# Patient Record
Sex: Female | Born: 1945 | Hispanic: No | Smoking: Former smoker
Health system: Southern US, Community
[De-identification: ages and names within clinical notes are randomized; demographics above are authoritative.]

## PROBLEM LIST (undated history)

## (undated) DIAGNOSIS — Z515 Encounter for palliative care: Secondary | ICD-10-CM

## (undated) DIAGNOSIS — R0600 Dyspnea, unspecified: Secondary | ICD-10-CM

## (undated) DIAGNOSIS — J9622 Acute and chronic respiratory failure with hypercapnia: Secondary | ICD-10-CM

## (undated) DIAGNOSIS — M1990 Unspecified osteoarthritis, unspecified site: Secondary | ICD-10-CM

## (undated) DIAGNOSIS — Z923 Personal history of irradiation: Secondary | ICD-10-CM

## (undated) DIAGNOSIS — Z7189 Other specified counseling: Secondary | ICD-10-CM

## (undated) DIAGNOSIS — J45909 Unspecified asthma, uncomplicated: Secondary | ICD-10-CM

## (undated) DIAGNOSIS — I10 Essential (primary) hypertension: Secondary | ICD-10-CM

## (undated) DIAGNOSIS — E119 Type 2 diabetes mellitus without complications: Secondary | ICD-10-CM

## (undated) DIAGNOSIS — I519 Heart disease, unspecified: Secondary | ICD-10-CM

## (undated) HISTORY — PX: KNEE SURGERY: SHX244

## (undated) HISTORY — DX: Type 2 diabetes mellitus without complications: E11.9

## (undated) HISTORY — DX: Unspecified asthma, uncomplicated: J45.909

## (undated) HISTORY — DX: Encounter for palliative care: Z51.5

## (undated) HISTORY — DX: Essential (primary) hypertension: I10

## (undated) HISTORY — DX: Heart disease, unspecified: I51.9

## (undated) HISTORY — DX: Other specified counseling: Z71.89

## (undated) HISTORY — DX: Acute and chronic respiratory failure with hypercapnia: J96.22

---

## 2001-08-19 ENCOUNTER — Encounter: Payer: Self-pay | Admitting: Emergency Medicine

## 2001-08-19 ENCOUNTER — Emergency Department (HOSPITAL_COMMUNITY): Admission: EM | Admit: 2001-08-19 | Discharge: 2001-08-19 | Payer: Self-pay | Admitting: Emergency Medicine

## 2013-09-16 ENCOUNTER — Emergency Department (HOSPITAL_COMMUNITY): Payer: Medicare HMO

## 2013-09-16 ENCOUNTER — Encounter (HOSPITAL_COMMUNITY): Payer: Self-pay | Admitting: Emergency Medicine

## 2013-09-16 ENCOUNTER — Inpatient Hospital Stay (HOSPITAL_COMMUNITY)
Admission: EM | Admit: 2013-09-16 | Discharge: 2013-09-19 | DRG: 192 | Disposition: A | Discharge status: Home Health | Payer: Medicare HMO | Attending: Family Medicine | Admitting: Family Medicine

## 2013-09-16 DIAGNOSIS — F17200 Nicotine dependence, unspecified, uncomplicated: Secondary | ICD-10-CM | POA: Diagnosis present

## 2013-09-16 DIAGNOSIS — R4789 Other speech disturbances: Secondary | ICD-10-CM | POA: Diagnosis present

## 2013-09-16 DIAGNOSIS — R7303 Prediabetes: Secondary | ICD-10-CM | POA: Diagnosis present

## 2013-09-16 DIAGNOSIS — J449 Chronic obstructive pulmonary disease, unspecified: Secondary | ICD-10-CM | POA: Diagnosis present

## 2013-09-16 DIAGNOSIS — R202 Paresthesia of skin: Secondary | ICD-10-CM | POA: Diagnosis present

## 2013-09-16 DIAGNOSIS — Z8249 Family history of ischemic heart disease and other diseases of the circulatory system: Secondary | ICD-10-CM

## 2013-09-16 DIAGNOSIS — R209 Unspecified disturbances of skin sensation: Secondary | ICD-10-CM | POA: Diagnosis present

## 2013-09-16 DIAGNOSIS — J441 Chronic obstructive pulmonary disease with (acute) exacerbation: Principal | ICD-10-CM | POA: Diagnosis present

## 2013-09-16 DIAGNOSIS — R5381 Other malaise: Secondary | ICD-10-CM | POA: Diagnosis present

## 2013-09-16 DIAGNOSIS — R42 Dizziness and giddiness: Secondary | ICD-10-CM | POA: Diagnosis present

## 2013-09-16 DIAGNOSIS — R9431 Abnormal electrocardiogram [ECG] [EKG]: Secondary | ICD-10-CM

## 2013-09-16 DIAGNOSIS — R55 Syncope and collapse: Secondary | ICD-10-CM | POA: Diagnosis present

## 2013-09-16 DIAGNOSIS — R5383 Other fatigue: Secondary | ICD-10-CM

## 2013-09-16 DIAGNOSIS — IMO0002 Reserved for concepts with insufficient information to code with codable children: Secondary | ICD-10-CM

## 2013-09-16 DIAGNOSIS — R4781 Slurred speech: Secondary | ICD-10-CM

## 2013-09-16 DIAGNOSIS — R0682 Tachypnea, not elsewhere classified: Secondary | ICD-10-CM | POA: Diagnosis present

## 2013-09-16 DIAGNOSIS — R7309 Other abnormal glucose: Secondary | ICD-10-CM | POA: Diagnosis present

## 2013-09-16 DIAGNOSIS — I498 Other specified cardiac arrhythmias: Secondary | ICD-10-CM | POA: Diagnosis present

## 2013-09-16 DIAGNOSIS — I10 Essential (primary) hypertension: Secondary | ICD-10-CM | POA: Diagnosis present

## 2013-09-16 DIAGNOSIS — R000 Tachycardia, unspecified: Secondary | ICD-10-CM | POA: Diagnosis present

## 2013-09-16 MED ORDER — SODIUM CHLORIDE 0.9 % IV SOLN
INTRAVENOUS | Status: DC
  Administered 2013-09-17: via INTRAVENOUS

## 2013-09-16 NOTE — ED Notes (Signed)
Bed: WA06 Expected date:  Expected time:  Means of arrival:  Comments: Ems.68 year old female dizzy and weak

## 2013-09-16 NOTE — ED Notes (Signed)
Patient was getting ready for bed and began to feel dizzy and weak. Patient denies recent illness. Patient was seen by PCP today and was diagnosed with chronic bronchitis. Unremarkable 12 lead ekg by EMS. p-154/80 hr-100, cbg 130.

## 2013-09-17 ENCOUNTER — Encounter (HOSPITAL_COMMUNITY): Payer: Self-pay | Admitting: Neurology

## 2013-09-17 ENCOUNTER — Observation Stay (HOSPITAL_COMMUNITY): Payer: Medicare HMO

## 2013-09-17 DIAGNOSIS — R209 Unspecified disturbances of skin sensation: Secondary | ICD-10-CM

## 2013-09-17 DIAGNOSIS — I519 Heart disease, unspecified: Secondary | ICD-10-CM

## 2013-09-17 DIAGNOSIS — J449 Chronic obstructive pulmonary disease, unspecified: Secondary | ICD-10-CM

## 2013-09-17 DIAGNOSIS — R4789 Other speech disturbances: Secondary | ICD-10-CM

## 2013-09-17 DIAGNOSIS — F17200 Nicotine dependence, unspecified, uncomplicated: Secondary | ICD-10-CM

## 2013-09-17 DIAGNOSIS — R55 Syncope and collapse: Secondary | ICD-10-CM | POA: Diagnosis present

## 2013-09-17 DIAGNOSIS — J441 Chronic obstructive pulmonary disease with (acute) exacerbation: Principal | ICD-10-CM

## 2013-09-17 DIAGNOSIS — I10 Essential (primary) hypertension: Secondary | ICD-10-CM | POA: Diagnosis not present

## 2013-09-17 HISTORY — DX: Syncope and collapse: R55

## 2013-09-17 HISTORY — DX: Nicotine dependence, unspecified, uncomplicated: F17.200

## 2013-09-17 HISTORY — DX: Chronic obstructive pulmonary disease, unspecified: J44.9

## 2013-09-17 LAB — CBC WITH DIFFERENTIAL/PLATELET
BASOS PCT: 1 % (ref 0–1)
Basophils Absolute: 0.1 10*3/uL (ref 0.0–0.1)
EOS ABS: 0.1 10*3/uL (ref 0.0–0.7)
Eosinophils Relative: 1 % (ref 0–5)
HCT: 40.1 % (ref 36.0–46.0)
HEMOGLOBIN: 13.6 g/dL (ref 12.0–15.0)
Lymphocytes Relative: 20 % (ref 12–46)
Lymphs Abs: 2.6 10*3/uL (ref 0.7–4.0)
MCH: 29.5 pg (ref 26.0–34.0)
MCHC: 33.9 g/dL (ref 30.0–36.0)
MCV: 87 fL (ref 78.0–100.0)
Monocytes Absolute: 1 10*3/uL (ref 0.1–1.0)
Monocytes Relative: 7 % (ref 3–12)
NEUTROS ABS: 9.5 10*3/uL — AB (ref 1.7–7.7)
NEUTROS PCT: 72 % (ref 43–77)
PLATELETS: 352 10*3/uL (ref 150–400)
RBC: 4.61 MIL/uL (ref 3.87–5.11)
RDW: 13.5 % (ref 11.5–15.5)
WBC: 13.2 10*3/uL — ABNORMAL HIGH (ref 4.0–10.5)

## 2013-09-17 LAB — COMPREHENSIVE METABOLIC PANEL
ALBUMIN: 3.9 g/dL (ref 3.5–5.2)
ALT: 12 U/L (ref 0–35)
ALT: 11 U/L (ref 0–35)
AST: 17 U/L (ref 0–37)
AST: 18 U/L (ref 0–37)
Albumin: 3.5 g/dL (ref 3.5–5.2)
Alkaline Phosphatase: 68 U/L (ref 39–117)
Alkaline Phosphatase: 62 U/L (ref 39–117)
BUN: 10 mg/dL (ref 6–23)
BUN: 13 mg/dL (ref 6–23)
CO2: 26 mEq/L (ref 19–32)
CO2: 26 mEq/L (ref 19–32)
CREATININE: 0.67 mg/dL (ref 0.50–1.10)
Calcium: 9.5 mg/dL (ref 8.4–10.5)
Calcium: 8.5 mg/dL (ref 8.4–10.5)
Chloride: 98 mEq/L (ref 96–112)
Chloride: 101 mEq/L (ref 96–112)
Creatinine, Ser: 0.71 mg/dL (ref 0.50–1.10)
GFR calc Af Amer: >90 mL/min (ref >90)
GFR calc non Af Amer: 89 mL/min — ABNORMAL LOW (ref >90)
GFR calc non Af Amer: >90 mL/min (ref >90)
Glucose, Bld: 128 mg/dL — ABNORMAL HIGH (ref 70–99)
Glucose, Bld: 131 mg/dL — ABNORMAL HIGH (ref 70–99)
Potassium: 4.8 mEq/L (ref 3.7–5.3)
Potassium: 4.5 mEq/L (ref 3.7–5.3)
SODIUM: 138 meq/L (ref 137–147)
SODIUM: 139 meq/L (ref 137–147)
TOTAL PROTEIN: 6.7 g/dL (ref 6.0–8.3)
TOTAL PROTEIN: 7.5 g/dL (ref 6.0–8.3)
Total Bilirubin: <0.2 mg/dL — ABNORMAL LOW (ref 0.3–1.2)
Total Bilirubin: 0.2 mg/dL — ABNORMAL LOW (ref 0.3–1.2)

## 2013-09-17 LAB — CBC
HEMATOCRIT: 38.1 % (ref 36.0–46.0)
HEMOGLOBIN: 12.6 g/dL (ref 12.0–15.0)
MCH: 29 pg (ref 26.0–34.0)
MCHC: 33.1 g/dL (ref 30.0–36.0)
MCV: 87.6 fL (ref 78.0–100.0)
Platelets: 338 10*3/uL (ref 150–400)
RBC: 4.35 MIL/uL (ref 3.87–5.11)
RDW: 13.4 % (ref 11.5–15.5)
WBC: 12.7 10*3/uL — ABNORMAL HIGH (ref 4.0–10.5)

## 2013-09-17 LAB — URINALYSIS W MICROSCOPIC + REFLEX CULTURE
Bilirubin Urine: NEGATIVE
GLUCOSE, UA: NEGATIVE mg/dL
HGB URINE DIPSTICK: NEGATIVE
Ketones, ur: NEGATIVE mg/dL
LEUKOCYTES UA: NEGATIVE
Nitrite: NEGATIVE
PROTEIN: NEGATIVE mg/dL
SPECIFIC GRAVITY, URINE: 1.021 (ref 1.005–1.030)
UROBILINOGEN UA: 0.2 mg/dL (ref 0.0–1.0)
pH: 6.5 (ref 5.0–8.0)

## 2013-09-17 LAB — TROPONIN I
Troponin I: <0.3 ng/mL (ref <0.30)
Troponin I: <0.3 ng/mL (ref <0.30)

## 2013-09-17 LAB — PROTIME-INR
INR: 0.97 (ref 0.00–1.49)
Prothrombin Time: 12.7 seconds (ref 11.6–15.2)

## 2013-09-17 LAB — TSH: TSH: 0.745 u[IU]/mL (ref 0.350–4.500)

## 2013-09-17 LAB — INFLUENZA PANEL BY PCR (TYPE A & B)
H1N1 flu by pcr: NOT DETECTED
Influenza A By PCR: NEGATIVE
Influenza B By PCR: NEGATIVE

## 2013-09-17 LAB — HEMOGLOBIN A1C
HEMOGLOBIN A1C: 6.1 % — AB (ref <5.7)
Mean Plasma Glucose: 128 mg/dL — ABNORMAL HIGH (ref <117)

## 2013-09-17 LAB — LIPASE, BLOOD: LIPASE: 39 U/L (ref 11–59)

## 2013-09-17 LAB — LACTIC ACID, PLASMA: Lactic Acid, Venous: 1 mmol/L (ref 0.5–2.2)

## 2013-09-17 LAB — MRSA PCR SCREENING: MRSA by PCR: NEGATIVE

## 2013-09-17 MED ORDER — DEXTROSE 5 % IV SOLN
500.0000 mg | INTRAVENOUS | Status: DC
  Administered 2013-09-17 – 2013-09-19 (×3): 500 mg via INTRAVENOUS
  Filled 2013-09-17 (×2): qty 500

## 2013-09-17 MED ORDER — DEXTROSE 5 % IV SOLN
1.0000 g | INTRAVENOUS | Status: DC
  Administered 2013-09-17 – 2013-09-19 (×3): 1 g via INTRAVENOUS
  Filled 2013-09-17 (×3): qty 10

## 2013-09-17 MED ORDER — GUAIFENESIN ER 600 MG PO TB12
1200.0000 mg | ORAL_TABLET | Freq: Two times a day (BID) | ORAL | Status: DC
  Administered 2013-09-17 – 2013-09-19 (×5): 1200 mg via ORAL
  Filled 2013-09-17 (×7): qty 2

## 2013-09-17 MED ORDER — IPRATROPIUM BROMIDE 0.02 % IN SOLN
0.5000 mg | RESPIRATORY_TRACT | Status: DC
  Administered 2013-09-17: 0.5 mg via RESPIRATORY_TRACT
  Filled 2013-09-17: qty 2.5

## 2013-09-17 MED ORDER — ENOXAPARIN SODIUM 40 MG/0.4ML ~~LOC~~ SOLN
40.0000 mg | SUBCUTANEOUS | Status: DC
  Administered 2013-09-17 – 2013-09-18 (×2): 40 mg via SUBCUTANEOUS
  Filled 2013-09-17 (×3): qty 0.4

## 2013-09-17 MED ORDER — IPRATROPIUM-ALBUTEROL 0.5-2.5 (3) MG/3ML IN SOLN: 3.0000 mL | RESPIRATORY_TRACT | Status: DC | PRN

## 2013-09-17 MED ORDER — ACETAMINOPHEN 325 MG PO TABS
650.0000 mg | ORAL_TABLET | ORAL | Status: DC | PRN
Start: 2013-09-17 — End: 2013-09-19
  Administered 2013-09-17: 650 mg via ORAL
  Filled 2013-09-17: qty 2

## 2013-09-17 MED ORDER — ASPIRIN 81 MG PO CHEW
81.0000 mg | CHEWABLE_TABLET | Freq: Every day | ORAL | Status: DC
  Administered 2013-09-17 – 2013-09-19 (×3): 81 mg via ORAL
  Filled 2013-09-17 (×4): qty 1

## 2013-09-17 MED ORDER — LISINOPRIL 10 MG PO TABS
10.0000 mg | ORAL_TABLET | Freq: Every day | ORAL | Status: DC
  Administered 2013-09-17 – 2013-09-19 (×3): 10 mg via ORAL
  Filled 2013-09-17 (×3): qty 1

## 2013-09-17 MED ORDER — ATENOLOL 12.5 MG HALF TABLET
12.5000 mg | ORAL_TABLET | Freq: Every day | ORAL | Status: DC
  Administered 2013-09-17 – 2013-09-19 (×3): 12.5 mg via ORAL
  Filled 2013-09-17 (×3): qty 1

## 2013-09-17 MED ORDER — ACETAMINOPHEN 650 MG RE SUPP: 650.0000 mg | Freq: Four times a day (QID) | RECTAL | Status: DC | PRN

## 2013-09-17 MED ORDER — IPRATROPIUM-ALBUTEROL 0.5-2.5 (3) MG/3ML IN SOLN
RESPIRATORY_TRACT | Status: AC
  Filled 2013-09-17: qty 3

## 2013-09-17 MED ORDER — ACETAMINOPHEN 650 MG RE SUPP: 650.0000 mg | RECTAL | Status: DC | PRN

## 2013-09-17 MED ORDER — ALBUTEROL SULFATE (2.5 MG/3ML) 0.083% IN NEBU
2.5000 mg | INHALATION_SOLUTION | RESPIRATORY_TRACT | Status: DC
  Administered 2013-09-17: 2.5 mg via RESPIRATORY_TRACT
  Filled 2013-09-17: qty 3

## 2013-09-17 MED ORDER — HYDRALAZINE HCL 20 MG/ML IJ SOLN
10.0000 mg | INTRAMUSCULAR | Status: DC | PRN
  Administered 2013-09-17: 10 mg via INTRAVENOUS
  Filled 2013-09-17: qty 0.5

## 2013-09-17 MED ORDER — ACETAMINOPHEN 325 MG PO TABS
650.0000 mg | ORAL_TABLET | Freq: Four times a day (QID) | ORAL | Status: DC | PRN
  Administered 2013-09-17: 650 mg via ORAL
  Filled 2013-09-17: qty 2

## 2013-09-17 MED ORDER — PREDNISONE 50 MG PO TABS
50.0000 mg | ORAL_TABLET | Freq: Every day | ORAL | Status: DC
  Administered 2013-09-17 – 2013-09-19 (×3): 50 mg via ORAL
  Filled 2013-09-17 (×4): qty 1

## 2013-09-17 MED ORDER — SODIUM CHLORIDE 0.9 % IJ SOLN
3.0000 mL | Freq: Two times a day (BID) | INTRAMUSCULAR | Status: DC
  Administered 2013-09-17 – 2013-09-18 (×2): 3 mL via INTRAVENOUS

## 2013-09-17 MED ORDER — HYDROCHLOROTHIAZIDE 12.5 MG PO CAPS
12.5000 mg | ORAL_CAPSULE | Freq: Every day | ORAL | Status: DC
  Administered 2013-09-17 – 2013-09-19 (×3): 12.5 mg via ORAL
  Filled 2013-09-17 (×3): qty 1

## 2013-09-17 MED ORDER — IPRATROPIUM-ALBUTEROL 0.5-2.5 (3) MG/3ML IN SOLN
3.0000 mL | RESPIRATORY_TRACT | Status: DC
  Administered 2013-09-17: 3 mL via RESPIRATORY_TRACT

## 2013-09-17 NOTE — Progress Notes (Signed)
Pt was seen and examined.  H&P and orders reviewed.  Called neurology consultation.   Maryln Manuel. Pau Banh, MD

## 2013-09-17 NOTE — ED Provider Notes (Signed)
CSN: [id number]     Arrival date & time 09/16/13  2258 History   First MD Initiated Contact with Patient 09/16/13 2307     Chief Complaint  Patient presents with  . Weakness  . Near Syncope  . Nausea  . Emesis    HPI Pt was seen at 2325. Per pt and her family, c/o gradual onset and persistence of constant generalized weakness that began this evening approx 2100/2130 PTA. Pt states she was getting ready for bed when she felt lightheaded/near syncopal and generally weak. Pt's family heard her cry out and when they went into her room she was sitting in a chair laying her upper body on a table. Family states pt told them she "couldn't move" her entire body. Pt's family states pt's speech was slurred and she felt her entire LUE was "tingling/numb." Pt's family states when they sat her up she vomited x3. Family states pt would not walk for them so they called 911. Pt and family now feel she has returned to her baseline. Pt's only current complaint is generalized weakness/fatigue. Pt was seen by her PMD today for cough and sinus congestion, rx zithromax, mucinex, and delsym. Denies fevers, no sore throat, no rash, no neck pain, no falls, no abd pain, no diarrhea, no black or blood in stools or emesis, no CP/SOB, no back pain, no focal motor weakness, no further tingling/numbness in extremities, no incont of bowel/bladder, no syncope.     Past Medical History  Diagnosis Date  . Chronic bronchitis    No past surgical history on file.  History  Substance Use Topics  . Smoking status: Current Every Day Smoker    Types: Cigarettes  . Smokeless tobacco: Not on file  . Alcohol Use: No    Review of Systems ROS: Statement: All systems negative except as marked or noted in the HPI; Constitutional: Negative for fever and chills. ; ; Eyes: Negative for eye pain, redness and discharge. ; ; ENMT: Negative for ear pain, hoarseness, sore throat. +nasal congestion, sinus pressure and rhinorrhea. ; ;  Cardiovascular: Negative for chest pain, palpitations, diaphoresis, dyspnea and peripheral edema. ; ; Respiratory: +cough. Negative for wheezing and stridor. ; ; Gastrointestinal: +N/V. Negative for diarrhea, abdominal pain, blood in stool, hematemesis, jaundice and rectal bleeding. . ; ; Genitourinary: Negative for dysuria, flank pain and hematuria. ; ; Musculoskeletal: Negative for back pain and neck pain. Negative for swelling and trauma.; ; Skin: Negative for pruritus, rash, abrasions, blisters, bruising and skin lesion.; ; Neuro: Negative for headache and neck stiffness. Negative for altered level of consciousness , altered mental status, extremity weakness, involuntary movement, seizure and +generalized weakness, slurred speech, LUE "numbness," lightheadedness, near syncope.      Allergies  Codeine  Home Medications   Current Outpatient Rx  Name  Route  Sig  Dispense  Refill  . azithromycin (ZITHROMAX) 250 MG tablet   Oral   Take 250-500 mg by mouth daily.         Marland Kitchen. dextromethorphan (DELSYM) 30 MG/5ML liquid   Oral   Take by mouth as needed for cough.         Marland Kitchen. guaiFENesin (MUCINEX) 600 MG 12 hr tablet   Oral   Take 1,200 mg by mouth 2 (two) times daily.          BP 178/76  Pulse 92  Temp(Src) 98.3 F (36.8 C) (Oral)  Resp 18  SpO2 95% Physical Exam 2330: Physical examination:  Nursing notes reviewed;  Vital signs and O2 SAT reviewed;  Constitutional: Well developed, Well nourished, Well hydrated, In no acute distress; Head:  Normocephalic, atraumatic; Eyes: EOMI, PERRL, No scleral icterus; ENMT: TM's clear bilat. +edemetous nasal turbinates bilat with clear rhinorrhea. Mouth and pharynx without lesions. No tonsillar exudates. No intra-oral edema. No submandibular or sublingual edema. No hoarse voice, no drooling, no stridor. No pain with manipulation of larynx. Mouth and pharynx normal, Mucous membranes moist; Neck: Supple, Full range of motion, No lymphadenopathy;  Cardiovascular: Regular rate and rhythm, No gallop; Respiratory: Breath sounds coarse & equal bilaterally, No wheezes.  Speaking full sentences with ease, Normal respiratory effort/excursion; Chest: Nontender, Movement normal; Abdomen: Soft, Nontender, Nondistended, Normal bowel sounds; Genitourinary: No CVA tenderness; Extremities: Pulses normal, No tenderness, No edema, No calf edema or asymmetry.; Neuro: AA&Ox3, Major CN grossly intact.Speech clear.  No facial droop.  No nystagmus. Grips equal. Strength 5/5 equal bilat UE's and LE's.  DTR 2/4 equal bilat UE's and LE's.  No gross sensory deficits.  Normal cerebellar testing bilat UE's (finger-nose) and LE's (heel-shin)..; Skin: Color normal, Warm, Dry.   ED Course  Procedures   EKG Interpretation    Date/Time:  Monday September 16 2013 23:15:25 EST Ventricular Rate:  86 PR Interval:  148 QRS Duration: 81 QT Interval:  416 QTC Calculation: 498 R Axis:   77 Text Interpretation:  Sinus rhythm Anteroseptal infarct, age indeterminate Abnormal T, probable ischemia, widespread Lateral leads are also involved No old tracing to compare Confirmed by [facility]  MD, Nicholos Johns [phone]) on 09/16/2013 11:35:10 PM            MDM  MDM Reviewed: previous chart, nursing note and vitals Reviewed previous: labs Interpretation: labs, ECG, x-ray and CT scan   Results for orders placed during the hospital encounter of 09/16/13  CBC WITH DIFFERENTIAL      Result Value Range   WBC 13.2 (*) 4.0 - 10.5 K/uL   RBC 4.61  3.87 - 5.11 MIL/uL   Hemoglobin 13.6  12.0 - 15.0 g/dL   HCT 47.8  29.5 - 62.1 %   MCV 87.0  78.0 - 100.0 fL   MCH 29.5  26.0 - 34.0 pg   MCHC 33.9  30.0 - 36.0 g/dL   RDW 30.8  65.7 - 84.6 %   Platelets 352  150 - 400 K/uL   Neutrophils Relative % 72  43 - 77 %   Neutro Abs 9.5 (*) 1.7 - 7.7 K/uL   Lymphocytes Relative 20  12 - 46 %   Lymphs Abs 2.6  0.7 - 4.0 K/uL   Monocytes Relative 7  3 - 12 %   Monocytes Absolute 1.0  0.1 - 1.0  K/uL   Eosinophils Relative 1  0 - 5 %   Eosinophils Absolute 0.1  0.0 - 0.7 K/uL   Basophils Relative 1  0 - 1 %   Basophils Absolute 0.1  0.0 - 0.1 K/uL  COMPREHENSIVE METABOLIC PANEL      Result Value Range   Sodium 138  137 - 147 mEq/L   Potassium 4.8  3.7 - 5.3 mEq/L   Chloride 98  96 - 112 mEq/L   CO2 26  19 - 32 mEq/L   Glucose, Bld 128 (*) 70 - 99 mg/dL   BUN 13  6 - 23 mg/dL   Creatinine, Ser 9.62  0.50 - 1.10 mg/dL   Calcium 9.5  8.4 - 95.2 mg/dL   Total Protein 7.5  6.0 - 8.3 g/dL  Albumin 3.9  3.5 - 5.2 g/dL   AST 17  0 - 37 U/L   ALT 12  0 - 35 U/L   Alkaline Phosphatase 68  39 - 117 U/L   Total Bilirubin <0.2 (*) 0.3 - 1.2 mg/dL   GFR calc non Af Amer 89 (*) >90 mL/min   GFR calc Af Amer >90  >90 mL/min  LIPASE, BLOOD      Result Value Range   Lipase 39  11 - 59 U/L  LACTIC ACID, PLASMA      Result Value Range   Lactic Acid, Venous 1.0  0.5 - 2.2 mmol/L  TROPONIN I      Result Value Range   Troponin I <0.30  <0.30 ng/mL  URINALYSIS W MICROSCOPIC + REFLEX CULTURE      Result Value Range   Color, Urine YELLOW  YELLOW   APPearance CLEAR  CLEAR   Specific Gravity, Urine 1.021  1.005 - 1.030   pH 6.5  5.0 - 8.0   Glucose, UA NEGATIVE  NEGATIVE mg/dL   Hgb urine dipstick NEGATIVE  NEGATIVE   Bilirubin Urine NEGATIVE  NEGATIVE   Ketones, ur NEGATIVE  NEGATIVE mg/dL   Protein, ur NEGATIVE  NEGATIVE mg/dL   Urobilinogen, UA 0.2  0.0 - 1.0 mg/dL   Nitrite NEGATIVE  NEGATIVE   Leukocytes, UA NEGATIVE  NEGATIVE   WBC, UA 0-2  <3 WBC/hpf   RBC / HPF 0-2  <3 RBC/hpf   Bacteria, UA RARE  RARE   Squamous Epithelial / LPF RARE  RARE   Dg Chest 2 View 09/17/2013   CLINICAL DATA:  Week, nausea.  Recent diagnosis of bronchitis.  EXAM: CHEST  2 VIEW  COMPARISON:  Chest radiograph report August 19, 2001  FINDINGS: Cardiac silhouette is unremarkable, moderately calcified aortic knob, mediastinal silhouette is otherwise unremarkable. Mild chronic interstitial changes with  increased lung volumes, mild flattening of the hemidiaphragms. No pleural effusions or focal consolidations. No pneumothorax.  Patient is osteopenic. Soft tissue planes are unremarkable. Multiple EKG lines overlie the patient and may obscure subtle underlying pathology.  IMPRESSION: Mild COPD.   Electronically Signed   By: Awilda Metro   On: 09/17/2013 00:38   Ct Head Wo Contrast 09/17/2013   CLINICAL DATA:  Dizziness and weakness.  EXAM: CT HEAD WITHOUT CONTRAST  TECHNIQUE: Contiguous axial images were obtained from the base of the skull through the vertex without intravenous contrast.  COMPARISON:  None available for comparison at time of study interpretation.  FINDINGS: The ventricles and sulci are normal . No intraparenchymal hemorrhage, mass effect nor midline shift. Minimal patchy supratentorial white matter hypodensities are within normal range for patient's age and though non-specific suggest sequelae of chronic small vessel ischemic disease. No acute large vascular territory infarcts.  No abnormal extra-axial fluid collections. Basal cisterns are patent. Moderate calcific atherosclerosis of the carotid siphons.  No skull fracture. Subcentimeter nonaggressive ground-glass lesion in the left occipital calvarium involving the diploic space and outer table may reflect an epidermal inclusion cyst. Visualized paranasal sinuses and mastoid aircells are well-aerated. The included ocular globes and orbital contents are non-suspicious.  IMPRESSION: No acute intracranial process. Normal noncontrast CT of the head for age.   Electronically Signed   By: Awilda Metro   On: 09/17/2013 00:41      0230:  Pt not orthostatic. Neuro exam remains intact and unchanged, VSS, resps easy. Dx and testing d/w pt and family.  Questions answered.  Verb understanding,  agreeable to observation admit.  T/C to [facility]. Allena Katz, case discussed, including:  HPI, pertinent PM/SHx, VS/PE, dx testing, ED course and treatment:   Agreeable to observation admit, requests to write temporary orders, obtain tele bed to team 8.    Laray Anger, DO 09/17/13 1559

## 2013-09-17 NOTE — Progress Notes (Signed)
UR completed. Patient changed to inpatient r/t requiring IV antibiotics.  

## 2013-09-17 NOTE — H&P (Signed)
[facility] History and Physical  Patient: Katie Waller  WUJ:[id number]  DOB: 12-15-1945  DOS: the patient was seen and examined on 09/17/2013 PCP: Lolita PatellaEADE,ROBERT ALEXANDER, MD  Chief Complaint: Near syncope  HPI: Katie Waller is a 68 y.o. female with Past medical history of active smoker chronic bronchitis. The patient is coming from home. The patient is family brought the patient of the hospital as they found her in the house in her room sitting on a chair unable to move her body. The patient mentions that she has been having some cough congestion and has been seen by her PCP earlier this morning we'll start her on azithromycin and Mucinex with over the counter antihistaminics and she has taken them in the she started feeling some tiredness and dizziness and sit down on her chair. She felt generalized weakness somewhat so that she was not able to lift herself on her own and when the daughter is tried to lift it up she actually had an episode of vomiting as well. She complain of dizziness at that time as well and some slurred speech. At present the patient appears at her baseline as per the family. No fever, chills, headache, cough, chest pain, palpitation, shortness of breath, orthopnea, PND, nausea, vomiting, abdominal pain, diarrhea, constipation, active bleeding, burning urination, dizziness, pedal edema,  focal neurological deficit.   Review of Systems: as mentioned in the history of present illness.  A Comprehensive review of the other systems is negative.  Past Medical History  Diagnosis Date  . Chronic bronchitis    No past surgical history on file. Social History:  reports that she has been smoking Cigarettes.  She has been smoking about 0.00 packs per day. She does not have any smokeless tobacco history on file. She reports that she does not drink alcohol or use illicit drugs. Independent for most of her  ADL.  Allergies  Allergen Reactions  . Codeine Nausea And  Vomiting    No family history on file.  Prior to Admission medications   Medication Sig Start Date End Date Taking? Authorizing Provider  azithromycin (ZITHROMAX) 250 MG tablet Take 250-500 mg by mouth daily.   Yes Historical Provider, MD  dextromethorphan (DELSYM) 30 MG/5ML liquid Take by mouth as needed for cough.   Yes Historical Provider, MD  guaiFENesin (MUCINEX) 600 MG 12 hr tablet Take 1,200 mg by mouth 2 (two) times daily.   Yes Historical Provider, MD    Physical Exam: Filed Vitals:   09/17/13 0046 09/17/13 0047 09/17/13 0049 09/17/13 0141  BP: 185/88 194/105 184/102 178/76  Pulse: 87 99 102 92  Temp:      TempSrc:      Resp:      SpO2:        General: Alert, Awake and Oriented to Time, Place and Person. Appear in mild distress Eyes: PERRL ENT: Oral Mucosa clear moist Neck: No JVD Cardiovascular: S1 and S2 Present, no Murmur, Peripheral Pulses Present Respiratory: Bilateral Air entry equal and Decreased, no Crackles, expiratory wheezes Abdomen: Bowel Sound Present, Soft and Non tender Skin: No Rash Extremities: No Pedal edema, no calf tenderness Neurologic: Grossly Unremarkable.  Labs on Admission:  CBC:  Recent Labs Lab 09/17/13 0026  WBC 13.2*  NEUTROABS 9.5*  HGB 13.6  HCT 40.1  MCV 87.0  PLT 352    CMP     Component Value Date/Time   NA 138 09/17/2013 0026   K 4.8 09/17/2013 0026   CL 98 09/17/2013  0026   CO2 26 09/17/2013 0026   GLUCOSE 128* 09/17/2013 0026   BUN 13 09/17/2013 0026   CREATININE 0.71 09/17/2013 0026   CALCIUM 9.5 09/17/2013 0026   PROT 7.5 09/17/2013 0026   ALBUMIN 3.9 09/17/2013 0026   AST 17 09/17/2013 0026   ALT 12 09/17/2013 0026   ALKPHOS 68 09/17/2013 0026   BILITOT <0.2* 09/17/2013 0026   GFRNONAA 89* 09/17/2013 0026   GFRAA >90 09/17/2013 0026     Recent Labs Lab 09/17/13 0026  LIPASE 39   No results found for this basename: AMMONIA,  in the last 168 hours   Recent Labs Lab 09/17/13 0026  TROPONINI <0.30   BNP  (last 3 results) No results found for this basename: PROBNP,  in the last 8760 hours  Radiological Exams on Admission: Dg Chest 2 View  09/17/2013   CLINICAL DATA:  Week, nausea.  Recent diagnosis of bronchitis.  EXAM: CHEST  2 VIEW  COMPARISON:  Chest radiograph report August 19, 2001  FINDINGS: Cardiac silhouette is unremarkable, moderately calcified aortic knob, mediastinal silhouette is otherwise unremarkable. Mild chronic interstitial changes with increased lung volumes, mild flattening of the hemidiaphragms. No pleural effusions or focal consolidations. No pneumothorax.  Patient is osteopenic. Soft tissue planes are unremarkable. Multiple EKG lines overlie the patient and may obscure subtle underlying pathology.  IMPRESSION: Mild COPD.   Electronically Signed   By: Awilda Metro   On: 09/17/2013 00:38   Ct Head Wo Contrast  09/17/2013   CLINICAL DATA:  Dizziness and weakness.  EXAM: CT HEAD WITHOUT CONTRAST  TECHNIQUE: Contiguous axial images were obtained from the base of the skull through the vertex without intravenous contrast.  COMPARISON:  None available for comparison at time of study interpretation.  FINDINGS: The ventricles and sulci are normal . No intraparenchymal hemorrhage, mass effect nor midline shift. Minimal patchy supratentorial white matter hypodensities are within normal range for patient's age and though non-specific suggest sequelae of chronic small vessel ischemic disease. No acute large vascular territory infarcts.  No abnormal extra-axial fluid collections. Basal cisterns are patent. Moderate calcific atherosclerosis of the carotid siphons.  No skull fracture. Subcentimeter nonaggressive ground-glass lesion in the left occipital calvarium involving the diploic space and outer table may reflect an epidermal inclusion cyst. Visualized paranasal sinuses and mastoid aircells are well-aerated. The included ocular globes and orbital contents are non-suspicious.  IMPRESSION: No  acute intracranial process. Normal noncontrast CT of the head for age.   Electronically Signed   By: Awilda Metro   On: 09/17/2013 00:41    EKG: Independently reviewed. normal sinus rhythm, nonspecific ST and T waves changes.  Assessment/Plan Principal Problem:   Near syncope Active Problems:   COPD exacerbation   1. Near syncope The patient is presenting with an episode of near syncope. Her initial lab work including troponins CT scan of the head as well as an EKG does not show any acute abnormality. Also her hemodynamic parameters are also within normal limits at her with this she will be due for observation. There was a complaint of slurred speech which is completely resolved at present which could also her 10 possible TIA for which she will get an MRI, echocardiogram, carotid Doppler. She also could have an episode of hypoxia due to her ongoing complaint of cough and congestion with her history of active smoking and possible COPD. She will monitor on telemetry  2. COPD exacerbation The patient has been active smoking. She does not have  any prior diagnosis of COPD but her chest x-ray appears slight COPD. She has expiratory wheezes on exam. And she appears tachypneic and tachycardic. With this she will be admitted of fluoroscopy oral steroids and nebulizers. She will be discharged on inhalers  DVT Prophylaxis: subcutaneous Heparin Nutrition: Advance as tolerated  Code Status: Full  Family Communication: Family was present at bedside, opportunity was given to ask question and all questions were answered satisfactorily at the time of interview. Disposition: Admitted to observation in telemetry unit.  Author: Lynden Oxford, MD [facility] Pager: [phone] 09/17/2013, 3:42 AM    If 7PM-7AM, please contact night-coverage [email or web address] Password [email or web address]

## 2013-09-17 NOTE — Progress Notes (Signed)
  Echocardiogram 2D Echocardiogram has been performed.  Jorje GuildCHUNG, Kamil Mchaffie 09/17/2013, 8:39 AM

## 2013-09-17 NOTE — Progress Notes (Addendum)
VASCULAR LAB PRELIMINARY  PRELIMINARY  PRELIMINARY  PRELIMINARY  Carotid duplex completed.    Preliminary report:  Bilateral:  1% to 39%  internal carotid artery stenosis.  Vertebral artery flow is antegrade.  Miachel Nardelli, RVS 09/17/2013, 12:01 PM

## 2013-09-17 NOTE — Consult Note (Signed)
NEURO HOSPITALIST CONSULT NOTE    Reason for Consult: near syncope  HPI:                                                                                                                                          Katie Waller is an 68 y.o. female who was brought to the hospital after her family noted she was significantly weak and unable to stand up by herself. She has been having some cough congestion the day prior to ED arrival and had been seen by her PCP earlier this morning we'll start her on azithromycin and Mucinex with over the counter antihistaminics.  She had arrived home, eaten her dinner and then took her medications. After taking her medications she started feeling "woozy headed" and sit down on her chair. She put her head down on a table and then states she "felt numb all over and weak".   Family heard her cry out and when they went into her room she was sitting in a chair laying her upper body on a table. Her daughter got her up to a seated position and she felt immediately nauseated with 3 episodes of vomiting. There was no loss of consciousness. Patient was noted to initially have slurred speech and left arm tingling but this was cleared by the time ED found her.      While hospitalized her BP has been elevated with most recent between 167/75 and198/85 and pulse 89-102.  Temperature is 98.3.  Negative orthostatics. MRI brain showed no acute intracranial abnormality and no significant intracranial stenosis. 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 2 D echo was normal.   While hospitalized family has noted when "she raises her left arm above 90 degrees she will start to show shaking".  During consultation I reproduced this shaking which would cease when patient calmed down and was distracted.  Currently patient is SOB, awake, oriented following commands.     Past Medical History  Diagnosis Date  . Chronic bronchitis     No past surgical history on  file.  Family History  Problem Relation Age of Onset  . Hyperlipidemia Mother   . Hypertension Mother   . Hypertension Father   . Hyperlipidemia Father     Social History:  reports that she has been smoking Cigarettes.  She has been smoking about 0.00 packs per day. She does not have any smokeless tobacco history on file. She reports that she does not drink alcohol or use illicit drugs.  Allergies  Allergen Reactions  . Codeine Nausea And Vomiting    MEDICATIONS:  Prior to Admission:  Prescriptions prior to admission  Medication Sig Dispense Refill  . azithromycin (ZITHROMAX) 250 MG tablet Take 250-500 mg by mouth daily.      Marland Kitchen dextromethorphan (DELSYM) 30 MG/5ML liquid Take by mouth as needed for cough.      Marland Kitchen guaiFENesin (MUCINEX) 600 MG 12 hr tablet Take 1,200 mg by mouth 2 (two) times daily.       Scheduled: . atenolol  12.5 mg Oral Daily  . azithromycin  500 mg Intravenous Q24H  . cefTRIAXone (ROCEPHIN)  IV  1 g Intravenous Q24H  . enoxaparin (LOVENOX) injection  40 mg Subcutaneous Q24H  . guaiFENesin  1,200 mg Oral BID  . hydrochlorothiazide  12.5 mg Oral Daily  . ipratropium-albuterol      . lisinopril  10 mg Oral Daily  . predniSONE  50 mg Oral Q breakfast  . sodium chloride  3 mL Intravenous Q12H   Continuous:  YIF:OYDXAJOINOMVE, acetaminophen, hydrALAZINE, ipratropium-albuterol   ROS:                                                                                                                                       History obtained from the patient  General ROS: negative for - chills, fatigue, fever, night sweats, weight gain or weight loss Psychological ROS: negative for - behavioral disorder, hallucinations, memory difficulties, mood swings or suicidal ideation Ophthalmic ROS: negative for - blurry vision, double vision, eye pain or loss of  vision ENT ROS: negative for - epistaxis, nasal discharge, oral lesions, sore throat, tinnitus or vertigo Allergy and Immunology ROS: negative for - hives or itchy/watery eyes Hematological and Lymphatic ROS: negative for - bleeding problems, bruising or swollen lymph nodes Endocrine ROS: negative for - galactorrhea, hair pattern changes, polydipsia/polyuria or temperature intolerance Respiratory ROS: positive for - cough,  shortness of breath or wheezing Cardiovascular ROS: positive for - dyspnea on exertion,  Gastrointestinal ROS: negative for - abdominal pain, diarrhea, hematemesis, nausea/vomiting or stool incontinence Genito-Urinary ROS: negative for - dysuria, hematuria, incontinence or urinary frequency/urgency Musculoskeletal ROS: negative for - joint swelling or muscular weakness Neurological ROS: as noted in HPI Dermatological ROS: negative for rash and skin lesion changes   Blood pressure 167/75, pulse 87, temperature 98.3 F (36.8 C), temperature source Oral, resp. rate 23, height _0  (1.549 m), weight 54.2 kg (119 lb 7.8 oz), SpO2 96.00%.   Neurologic Examination:  Mental Status: Alert, oriented, thought content appropriate.  Speech fluent without evidence of aphasia.  Able to follow 3 step commands without difficulty. Cranial Nerves: II: Discs flat bilaterally; Visual fields grossly normal, pupils equal, round, reactive to light and accommodation III,IV, VI: ptosis not present, extra-ocular motions intact bilaterally V,VII: smile symmetric but at rest shows a right facial droop, facial light touch sensation stated to be decreased on the left from forehead to jaw VIII: hearing normal bilaterally IX,X: gag reflex present XI: bilateral shoulder shrug XII: midline tongue extension without atrophy or fasciculations  Motor: Right : Upper extremity   5/5    Left:     Upper  extremity   4/5  Lower extremity   5/5     Lower extremity   4/5 --she shows decreased effort on the left side especially in th UE stating she has discomfort.  When her shoulder abduction was tested she started to shake her left arm and become anxious.  After 3 seconds of calming patient down and distracting her attention the shaking ceased.   Tone and bulk:normal tone throughout; no atrophy noted Sensory: Pinprick and light touch intact throughout, bilaterally Deep Tendon Reflexes:  Right: Upper Extremity   Left: Upper extremity   biceps (C-5 to C-6) 2/4   biceps (C-5 to C-6) 2/4 tricep (C7) 2/4    triceps (C7) 2/4 Brachioradialis (C6) 2/4  Brachioradialis (C6) 2/4  Lower Extremity Lower Extremity  quadriceps (L-2 to L-4) 3/4   quadriceps (L-2 to L-4) 3/4 Achilles (S1) 3/4 (5 beats clonus) Achilles (S1) 4/4 (sustained clonus)  Plantars: Right: downgoing   Left: downgoing Cerebellar: normal finger-to-nose,  normal heel-to-shin test Gait: not tested due to multiple leads CV: pulses palpable throughout    No components found with this basename: cbc,  bmp,  coags,  chol,  tri,  ldl,  hga1c    Results for orders placed during the hospital encounter of 09/16/13 (from the past 48 hour(s))  CBC WITH DIFFERENTIAL     Status: Abnormal   Collection Time    09/17/13 12:26 AM      Result Value Range   WBC 13.2 (*) 4.0 - 10.5 K/uL   RBC 4.61  3.87 - 5.11 MIL/uL   Hemoglobin 13.6  12.0 - 15.0 g/dL   HCT 40.1  36.0 - 46.0 %   MCV 87.0  78.0 - 100.0 fL   MCH 29.5  26.0 - 34.0 pg   MCHC 33.9  30.0 - 36.0 g/dL   RDW 13.5  11.5 - 15.5 %   Platelets 352  150 - 400 K/uL   Neutrophils Relative % 72  43 - 77 %   Neutro Abs 9.5 (*) 1.7 - 7.7 K/uL   Lymphocytes Relative 20  12 - 46 %   Lymphs Abs 2.6  0.7 - 4.0 K/uL   Monocytes Relative 7  3 - 12 %   Monocytes Absolute 1.0  0.1 - 1.0 K/uL   Eosinophils Relative 1  0 - 5 %   Eosinophils Absolute 0.1  0.0 - 0.7 K/uL   Basophils Relative 1  0 -  1 %   Basophils Absolute 0.1  0.0 - 0.1 K/uL  COMPREHENSIVE METABOLIC PANEL     Status: Abnormal   Collection Time    09/17/13 12:26 AM      Result Value Range   Sodium 138  137 - 147 mEq/L   Potassium 4.8  3.7 - 5.3 mEq/L   Chloride 98  96 -  112 mEq/L   CO2 26  19 - 32 mEq/L   Glucose, Bld 128 (*) 70 - 99 mg/dL   BUN 13  6 - 23 mg/dL   Creatinine, Ser 0.71  0.50 - 1.10 mg/dL   Calcium 9.5  8.4 - 10.5 mg/dL   Total Protein 7.5  6.0 - 8.3 g/dL   Albumin 3.9  3.5 - 5.2 g/dL   AST 17  0 - 37 U/L   Comment: SLIGHT HEMOLYSIS   ALT 12  0 - 35 U/L   Alkaline Phosphatase 68  39 - 117 U/L   Total Bilirubin <0.2 (*) 0.3 - 1.2 mg/dL   GFR calc non Af Amer 89 (*) >90 mL/min   GFR calc Af Amer >90  >90 mL/min   Comment: (NOTE)     The eGFR has been calculated using the CKD EPI equation.     This calculation has not been validated in all clinical situations.     eGFR's persistently <90 mL/min signify possible Chronic Kidney     Disease.  LIPASE, BLOOD     Status: None   Collection Time    09/17/13 12:26 AM      Result Value Range   Lipase 39  11 - 59 U/L  LACTIC ACID, PLASMA     Status: None   Collection Time    09/17/13 12:26 AM      Result Value Range   Lactic Acid, Venous 1.0  0.5 - 2.2 mmol/L  TROPONIN I     Status: None   Collection Time    09/17/13 12:26 AM      Result Value Range   Troponin I <0.30  <0.30 ng/mL   Comment:            Due to the release kinetics of cTnI,     a negative result within the first hours     of the onset of symptoms does not rule out     myocardial infarction with certainty.     If myocardial infarction is still suspected,     repeat the test at appropriate intervals.  URINALYSIS W MICROSCOPIC + REFLEX CULTURE     Status: None   Collection Time    09/17/13 12:57 AM      Result Value Range   Color, Urine YELLOW  YELLOW   APPearance CLEAR  CLEAR   Specific Gravity, Urine 1.021  1.005 - 1.030   pH 6.5  5.0 - 8.0   Glucose, UA NEGATIVE   NEGATIVE mg/dL   Hgb urine dipstick NEGATIVE  NEGATIVE   Bilirubin Urine NEGATIVE  NEGATIVE   Ketones, ur NEGATIVE  NEGATIVE mg/dL   Protein, ur NEGATIVE  NEGATIVE mg/dL   Urobilinogen, UA 0.2  0.0 - 1.0 mg/dL   Nitrite NEGATIVE  NEGATIVE   Leukocytes, UA NEGATIVE  NEGATIVE   WBC, UA 0-2  <3 WBC/hpf   RBC / HPF 0-2  <3 RBC/hpf   Bacteria, UA RARE  RARE   Squamous Epithelial / LPF RARE  RARE  INFLUENZA PANEL BY PCR (TYPE A & B, H1N1)     Status: None   Collection Time    09/17/13  3:10 AM      Result Value Range   Influenza A By PCR NEGATIVE  NEGATIVE   Influenza B By PCR NEGATIVE  NEGATIVE   H1N1 flu by pcr NOT DETECTED  NOT DETECTED   Comment:  The Xpert Flu assay (FDA approved for     nasal aspirates or washes and     nasopharyngeal swab specimens), is     intended as an aid in the diagnosis of     influenza and should not be used as     a sole basis for treatment.     Performed at [place] PANEL     Status: Abnormal   Collection Time    09/17/13  4:30 AM      Result Value Range   Sodium 139  137 - 147 mEq/L   Potassium 4.5  3.7 - 5.3 mEq/L   Chloride 101  96 - 112 mEq/L   CO2 26  19 - 32 mEq/L   Glucose, Bld 131 (*) 70 - 99 mg/dL   BUN 10  6 - 23 mg/dL   Creatinine, Ser 0.67  0.50 - 1.10 mg/dL   Calcium 8.5  8.4 - 10.5 mg/dL   Total Protein 6.7  6.0 - 8.3 g/dL   Albumin 3.5  3.5 - 5.2 g/dL   AST 18  0 - 37 U/L   Comment: SLIGHT HEMOLYSIS   ALT 11  0 - 35 U/L   Alkaline Phosphatase 62  39 - 117 U/L   Total Bilirubin 0.2 (*) 0.3 - 1.2 mg/dL   GFR calc non Af Amer >90  >90 mL/min   GFR calc Af Amer >90  >90 mL/min   Comment: (NOTE)     The eGFR has been calculated using the CKD EPI equation.     This calculation has not been validated in all clinical situations.     eGFR's persistently <90 mL/min signify possible Chronic Kidney     Disease.  CBC     Status: Abnormal   Collection Time    09/17/13  4:30 AM      Result  Value Range   WBC 12.7 (*) 4.0 - 10.5 K/uL   RBC 4.35  3.87 - 5.11 MIL/uL   Hemoglobin 12.6  12.0 - 15.0 g/dL   HCT 38.1  36.0 - 46.0 %   MCV 87.6  78.0 - 100.0 fL   MCH 29.0  26.0 - 34.0 pg   MCHC 33.1  30.0 - 36.0 g/dL   RDW 13.4  11.5 - 15.5 %   Platelets 338  150 - 400 K/uL  PROTIME-INR     Status: None   Collection Time    09/17/13  4:30 AM      Result Value Range   Prothrombin Time 12.7  11.6 - 15.2 seconds   INR 0.97  0.00 - 1.49  MRSA PCR SCREENING     Status: None   Collection Time    09/17/13  6:40 AM      Result Value Range   MRSA by PCR NEGATIVE  NEGATIVE   Comment:            The GeneXpert MRSA Assay (FDA     approved for NASAL specimens     only), is one component of a     comprehensive MRSA colonization     surveillance program. It is not     intended to diagnose MRSA     infection nor to guide or     monitor treatment for     MRSA infections.  TROPONIN I     Status: None   Collection Time    09/17/13  7:56 AM      Result  Value Range   Troponin I <0.30  <0.30 ng/mL   Comment:            Due to the release kinetics of cTnI,     a negative result within the first hours     of the onset of symptoms does not rule out     myocardial infarction with certainty.     If myocardial infarction is still suspected,     repeat the test at appropriate intervals.  TROPONIN I     Status: None   Collection Time    09/17/13  1:58 PM      Result Value Range   Troponin I <0.30  <0.30 ng/mL   Comment:            Due to the release kinetics of cTnI,     a negative result within the first hours     of the onset of symptoms does not rule out     myocardial infarction with certainty.     If myocardial infarction is still suspected,     repeat the test at appropriate intervals.    Dg Chest 2 View  09/17/2013   CLINICAL DATA:  Week, nausea.  Recent diagnosis of bronchitis.  EXAM: CHEST  2 VIEW  COMPARISON:  Chest radiograph report August 19, 2001  FINDINGS: Cardiac  silhouette is unremarkable, moderately calcified aortic knob, mediastinal silhouette is otherwise unremarkable. Mild chronic interstitial changes with increased lung volumes, mild flattening of the hemidiaphragms. No pleural effusions or focal consolidations. No pneumothorax.  Patient is osteopenic. Soft tissue planes are unremarkable. Multiple EKG lines overlie the patient and may obscure subtle underlying pathology.  IMPRESSION: Mild COPD.   Electronically Signed   By: Elon Alas   On: 09/17/2013 00:38   Ct Head Wo Contrast  09/17/2013   CLINICAL DATA:  Dizziness and weakness.  EXAM: CT HEAD WITHOUT CONTRAST  TECHNIQUE: Contiguous axial images were obtained from the base of the skull through the vertex without intravenous contrast.  COMPARISON:  None available for comparison at time of study interpretation.  FINDINGS: The ventricles and sulci are normal . No intraparenchymal hemorrhage, mass effect nor midline shift. Minimal patchy supratentorial white matter hypodensities are within normal range for patient's age and though non-specific suggest sequelae of chronic small vessel ischemic disease. No acute large vascular territory infarcts.  No abnormal extra-axial fluid collections. Basal cisterns are patent. Moderate calcific atherosclerosis of the carotid siphons.  No skull fracture. Subcentimeter nonaggressive ground-glass lesion in the left occipital calvarium involving the diploic space and outer table may reflect an epidermal inclusion cyst. Visualized paranasal sinuses and mastoid aircells are well-aerated. The included ocular globes and orbital contents are non-suspicious.  IMPRESSION: No acute intracranial process. Normal noncontrast CT of the head for age.   Electronically Signed   By: Elon Alas   On: 09/17/2013 00:41   Mr Jodene Nam Head Wo Contrast  09/17/2013   CLINICAL DATA:  68 year old female with slurred speech. Left arm tingling. Weakness. Initial encounter.  EXAM: MRI HEAD WITHOUT  CONTRAST  MRA HEAD WITHOUT CONTRAST  TECHNIQUE: Multiplanar, multiecho pulse sequences of the brain and surrounding structures were obtained without intravenous contrast. Angiographic images of the head were obtained using MRA technique without contrast.  COMPARISON:  Head CT without contrast 09/16/2013.  FINDINGS: MRI HEAD FINDINGS  No restricted diffusion to suggest acute infarction. No midline shift, mass effect, evidence of mass lesion, ventriculomegaly, extra-axial collection or acute intracranial hemorrhage. Cervicomedullary junction and  pituitary are within normal limits. Major intracranial vascular flow voids are preserved.  Patchy nonspecific cerebral white matter T2 and FLAIR hyperintensity, mostly periventricular. No cortical encephalomalacia identified. Deep gray matter nuclei brainstem and cerebellum within normal limits. Visible internal auditory structures appear normal.  Visualized paranasal sinuses and mastoids are clear. Visualized orbit soft tissues are within normal limits. Normal bone marrow signal. Visualized scalp soft tissues are within normal limits.  Degenerative changes are evident in the cervical spine, including C4-C5 retrolisthesis. 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  MRA HEAD FINDINGS  Antegrade flow in the posterior circulation. Codominant distal vertebral arteries. Normal PICA origins. Patent vertebrobasilar junction. No basilar artery stenosis. Normal SCA and PCA origins. Small posterior communicating arteries. Normal left PCA branches. Mild to moderate right PCA P1 segment stenosis (series 504, image 14). Otherwise normal right PCA branches.  Antegrade flow in both ICA siphons. Motion artifact at the cavernous ICA level. No ICA stenosis suspected. Grossly normal ophthalmic and posterior communicating artery origins.  Patent carotid termini. Normal MCA and ACA origins. Mild motion artifact affecting the bilateral MCA and ACA branches. Anterior  communicating artery and visualized ACA branches are within normal limits. Visualized bilateral MCA branches are within normal limits.  IMPRESSION: 1. No acute intracranial abnormality. Mild for age nonspecific cerebral white matter signal changes. 2. Negative intracranial MRA except for mild to moderate right PCA P1 segment stenosis. 3. Partially visible cervical spine degeneration, suspect multilevel mild cervical spinal stenosis.   Electronically Signed   By: Lars Pinks M.D.   On: 09/17/2013 10:28   Mr Brain Wo Contrast  09/17/2013   CLINICAL DATA:  68 year old female with slurred speech. Left arm tingling. Weakness. Initial encounter.  EXAM: MRI HEAD WITHOUT CONTRAST  MRA HEAD WITHOUT CONTRAST  TECHNIQUE: Multiplanar, multiecho pulse sequences of the brain and surrounding structures were obtained without intravenous contrast. Angiographic images of the head were obtained using MRA technique without contrast.  COMPARISON:  Head CT without contrast 09/16/2013.  FINDINGS: MRI HEAD FINDINGS  No restricted diffusion to suggest acute infarction. No midline shift, mass effect, evidence of mass lesion, ventriculomegaly, extra-axial collection or acute intracranial hemorrhage. Cervicomedullary junction and pituitary are within normal limits. Major intracranial vascular flow voids are preserved.  Patchy nonspecific cerebral white matter T2 and FLAIR hyperintensity, mostly periventricular. No cortical encephalomalacia identified. Deep gray matter nuclei brainstem and cerebellum within normal limits. Visible internal auditory structures appear normal.  Visualized paranasal sinuses and mastoids are clear. Visualized orbit soft tissues are within normal limits. Normal bone marrow signal. Visualized scalp soft tissues are within normal limits.  Degenerative changes are evident in the cervical spine, including C4-C5 retrolisthesis. 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  MRA HEAD FINDINGS   Antegrade flow in the posterior circulation. Codominant distal vertebral arteries. Normal PICA origins. Patent vertebrobasilar junction. No basilar artery stenosis. Normal SCA and PCA origins. Small posterior communicating arteries. Normal left PCA branches. Mild to moderate right PCA P1 segment stenosis (series 504, image 14). Otherwise normal right PCA branches.  Antegrade flow in both ICA siphons. Motion artifact at the cavernous ICA level. No ICA stenosis suspected. Grossly normal ophthalmic and posterior communicating artery origins.  Patent carotid termini. Normal MCA and ACA origins. Mild motion artifact affecting the bilateral MCA and ACA branches. Anterior communicating artery and visualized ACA branches are within normal limits. Visualized bilateral MCA branches are within normal limits.  IMPRESSION: 1. No acute intracranial abnormality. Mild for age nonspecific cerebral  white matter signal changes. 2. Negative intracranial MRA except for mild to moderate right PCA P1 segment stenosis. 3. Partially visible cervical spine degeneration, suspect multilevel mild cervical spinal stenosis.   Electronically Signed   By: Lars Pinks M.D.   On: 09/17/2013 10:28    Assessment and plan per attending neurologist  Etta Quill PA-C [facility] [phone]  09/17/2013, 4:24 PM   Assessment/Plan:  68 YO female with presyncopal event associated with dysarthria and left arm tingling which had cleared by ED arrival.  While hospitalized patient was noted to have Left arm jerking motions associated with possible AMS during event.  On exam she complains of ongoing left arm weakness and decreased sensation of left face, arm and leg. MRI shows no acute infarct to explain ongoing left arm tingling and weakness.  Due to symptoms of AMS and left arm jerking cannot R/O possible seizure.    Recommend: 1) EEG 2) Low dose ASA 81 mg daily  I personally history this patient's evaluation and management,  including formulating the above clinical assessment and management recommendations.  Rush Farmer M.D. [facility] [phone]

## 2013-09-17 NOTE — Progress Notes (Signed)
[id number] pt had an witnessed episode that lasted approximately 1 minute, where pt stated that she feels shaking in her head and some tingling in her left arm complained of nausea. When asked if she had some pressure in her chest, she stated yes. I obtained a twelve lead EKG. Checked pt pupils and they were equal and reactive. Pt was responsive during episode. Md notified and made aware, orders received. Will continue to monitor.

## 2013-09-18 ENCOUNTER — Inpatient Hospital Stay (HOSPITAL_COMMUNITY)
Admit: 2013-09-18 | Discharge: 2013-09-18 | Disposition: A | Discharge status: Home / Self Care | Payer: Medicare HMO | Attending: Neurology | Admitting: Neurology

## 2013-09-18 DIAGNOSIS — I10 Essential (primary) hypertension: Secondary | ICD-10-CM

## 2013-09-18 DIAGNOSIS — R000 Tachycardia, unspecified: Secondary | ICD-10-CM

## 2013-09-18 DIAGNOSIS — I498 Other specified cardiac arrhythmias: Secondary | ICD-10-CM

## 2013-09-18 HISTORY — DX: Tachycardia, unspecified: R00.0

## 2013-09-18 HISTORY — DX: Essential (primary) hypertension: I10

## 2013-09-18 LAB — COMPREHENSIVE METABOLIC PANEL
ALT: 13 U/L (ref 0–35)
AST: 15 U/L (ref 0–37)
Albumin: 3.7 g/dL (ref 3.5–5.2)
Alkaline Phosphatase: 61 U/L (ref 39–117)
BUN: 13 mg/dL (ref 6–23)
CHLORIDE: 98 meq/L (ref 96–112)
CO2: 25 meq/L (ref 19–32)
CREATININE: 0.78 mg/dL (ref 0.50–1.10)
Calcium: 9.6 mg/dL (ref 8.4–10.5)
GFR calc Af Amer: >90 mL/min (ref >90)
GFR, EST NON AFRICAN AMERICAN: 86 mL/min — AB (ref >90)
Glucose, Bld: 123 mg/dL — ABNORMAL HIGH (ref 70–99)
Potassium: 4.1 mEq/L (ref 3.7–5.3)
Sodium: 136 mEq/L — ABNORMAL LOW (ref 137–147)
Total Bilirubin: 0.3 mg/dL (ref 0.3–1.2)
Total Protein: 7.4 g/dL (ref 6.0–8.3)

## 2013-09-18 LAB — LIPID PANEL
CHOLESTEROL: 169 mg/dL (ref 0–200)
HDL: 76 mg/dL (ref >39)
LDL CALC: 66 mg/dL (ref 0–99)
TRIGLYCERIDES: 134 mg/dL (ref <150)
Total CHOL/HDL Ratio: 2.2 RATIO
VLDL: 27 mg/dL (ref 0–40)

## 2013-09-18 LAB — VITAMIN D 25 HYDROXY (VIT D DEFICIENCY, FRACTURES): VIT D 25 HYDROXY: 26 ng/mL — AB (ref 30–89)

## 2013-09-18 NOTE — Progress Notes (Signed)
[facility] PROGRESS NOTE  Katie DingwallVelma L Waller WGN:[id number] DOB: 03/17/1946 DOA: 09/16/2013 PCP: Lolita PatellaEADE,ROBERT ALEXANDER, MD  Assessment/Plan: 1. Near syncope  The patient is presenting with an episode of near syncope. Her initial lab work including troponins CT scan of the head as well as an EKG does not show any acute abnormality. Also her hemodynamic parameters are also within normal limits. There was a complaint of slurred speech which is completely resolved at present which could also her possible TIA for which she will get an MRI, echocardiogram, carotid Doppler. She also could have an episode of hypoxia due to her ongoing complaint of cough and congestion with her history of active smoking and possible COPD. monitor on telemetry   Possible seizure activity - Pt is currently receiving an EEG study, Appreciate Neuro consult and workup.  PT eval ordered today.   Pt has been started on aspirin 81 mg daily.   2. COPD exacerbation  The patient has been active smoking. She does not have any prior diagnosis of COPD but her chest x-ray appears slight COPD. She has expiratory wheezes on exam. And she appears tachypneic and tachycardic. With this she will be admitted of fluoroscopy oral steroids and nebulizers. She will be discharged on inhalers   3. Hypertension and tachycardia - Pt's blood pressure has started to improve on antihypertensives.  Continue lisinopril, HCTZ, atenolol, and counseled patient that she will need close outpatient follow up with her PCP.    DVT Prophylaxis: subcutaneous Heparin  Nutrition: Advance as tolerated   Code Status: Full  Family Communication: Family was present at bedside, opportunity was given to ask question and all questions were answered satisfactorily at the time of interview.  Disposition: pending neuro recs   HPI/Subjective: Pt says that she feels much better today  Objective: Filed Vitals:   09/18/13 0530  BP: 138/66  Pulse: 110  Temp: 98.2  F (36.8 C)  Resp: 24    Intake/Output Summary (Last 24 hours) at 09/18/13 1017 Last data filed at 09/18/13 [id number]  Gross per 24 hour  Intake    385 ml  Output      0 ml  Net    385 ml   Filed Weights   09/17/13 0621  Weight: 119 lb 7.8 oz (54.2 kg)    Exam:  General: Alert, Awake and Oriented to Time, Place and Person. No apparent distress Eyes: PERRL ENT: Oral Mucosa clear moist  Neck: No JVD thyroid soft, no masses Cardiovascular: S1 and S2 Present, no Murmur, Peripheral Pulses Present  Respiratory: BBS clear   Abdomen: Bowel Sound Present, Soft and Non tender  Skin: No Rash  Extremities: No Pedal edema, no calf tenderness  Neurologic: nonfocal  Data Reviewed: Basic Metabolic Panel:  Recent Labs Lab 09/17/13 0026 09/17/13 0430 09/18/13 0614  NA 138 139 136*  K 4.8 4.5 4.1  CL 98 101 98  CO2 26 26 25   GLUCOSE 128* 131* 123*  BUN 13 10 13   CREATININE 0.71 0.67 0.78  CALCIUM 9.5 8.5 9.6   Liver Function Tests:  Recent Labs Lab 09/17/13 0026 09/17/13 0430 09/18/13 0614  AST 17 18 15   ALT 12 11 13   ALKPHOS 68 62 61  BILITOT <0.2* 0.2* 0.3  PROT 7.5 6.7 7.4  ALBUMIN 3.9 3.5 3.7    Recent Labs Lab 09/17/13 0026  LIPASE 39   No results found for this basename: AMMONIA,  in the last 168 hours CBC:  Recent Labs Lab 09/17/13 0026 09/17/13 0430  WBC 13.2* 12.7*  NEUTROABS 9.5*  --   HGB 13.6 12.6  HCT 40.1 38.1  MCV 87.0 87.6  PLT 352 338   Cardiac Enzymes:  Recent Labs Lab 09/17/13 0026 09/17/13 0756 09/17/13 1358  TROPONINI <0.30 <0.30 <0.30   BNP (last 3 results) No results found for this basename: PROBNP,  in the last 8760 hours CBG: No results found for this basename: GLUCAP,  in the last 168 hours  Recent Results (from the past 240 hour(s))  MRSA PCR SCREENING     Status: None   Collection Time    09/17/13  6:40 AM      Result Value Range Status   MRSA by PCR NEGATIVE  NEGATIVE Final   Comment:            The GeneXpert  MRSA Assay (FDA     approved for NASAL specimens     only), is one component of a     comprehensive MRSA colonization     surveillance program. It is not     intended to diagnose MRSA     infection nor to guide or     monitor treatment for     MRSA infections.    Studies: Dg Chest 2 View  09/17/2013   CLINICAL DATA:  Week, nausea.  Recent diagnosis of bronchitis.  EXAM: CHEST  2 VIEW  COMPARISON:  Chest radiograph report August 19, 2001  FINDINGS: Cardiac silhouette is unremarkable, moderately calcified aortic knob, mediastinal silhouette is otherwise unremarkable. Mild chronic interstitial changes with increased lung volumes, mild flattening of the hemidiaphragms. No pleural effusions or focal consolidations. No pneumothorax.  Patient is osteopenic. Soft tissue planes are unremarkable. Multiple EKG lines overlie the patient and may obscure subtle underlying pathology.  IMPRESSION: Mild COPD.   Electronically Signed   By: Awilda Metro   On: 09/17/2013 00:38   Ct Head Wo Contrast  09/17/2013   CLINICAL DATA:  Dizziness and weakness.  EXAM: CT HEAD WITHOUT CONTRAST  TECHNIQUE: Contiguous axial images were obtained from the base of the skull through the vertex without intravenous contrast.  COMPARISON:  None available for comparison at time of study interpretation.  FINDINGS: The ventricles and sulci are normal . No intraparenchymal hemorrhage, mass effect nor midline shift. Minimal patchy supratentorial white matter hypodensities are within normal range for patient's age and though non-specific suggest sequelae of chronic small vessel ischemic disease. No acute large vascular territory infarcts.  No abnormal extra-axial fluid collections. Basal cisterns are patent. Moderate calcific atherosclerosis of the carotid siphons.  No skull fracture. Subcentimeter nonaggressive ground-glass lesion in the left occipital calvarium involving the diploic space and outer table may reflect an epidermal  inclusion cyst. Visualized paranasal sinuses and mastoid aircells are well-aerated. The included ocular globes and orbital contents are non-suspicious.  IMPRESSION: No acute intracranial process. Normal noncontrast CT of the head for age.   Electronically Signed   By: Awilda Metro   On: 09/17/2013 00:41   Mr Maxine Glenn Head Wo Contrast  09/17/2013   CLINICAL DATA:  68 year old female with slurred speech. Left arm tingling. Weakness. Initial encounter.  EXAM: MRI HEAD WITHOUT CONTRAST  MRA HEAD WITHOUT CONTRAST  TECHNIQUE: Multiplanar, multiecho pulse sequences of the brain and surrounding structures were obtained without intravenous contrast. Angiographic images of the head were obtained using MRA technique without contrast.  COMPARISON:  Head CT without contrast 09/16/2013.  FINDINGS: MRI HEAD FINDINGS  No restricted diffusion to suggest acute infarction. No midline shift,  mass effect, evidence of mass lesion, ventriculomegaly, extra-axial collection or acute intracranial hemorrhage. Cervicomedullary junction and pituitary are within normal limits. Major intracranial vascular flow voids are preserved.  Patchy nonspecific cerebral white matter T2 and FLAIR hyperintensity, mostly periventricular. No cortical encephalomalacia identified. Deep gray matter nuclei brainstem and cerebellum within normal limits. Visible internal auditory structures appear normal.  Visualized paranasal sinuses and mastoids are clear. Visualized orbit soft tissues are within normal limits. Normal bone marrow signal. Visualized scalp soft tissues are within normal limits.  Degenerative changes are evident in the cervical spine, including C4-C5 retrolisthesis. 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  MRA HEAD FINDINGS  Antegrade flow in the posterior circulation. Codominant distal vertebral arteries. Normal PICA origins. Patent vertebrobasilar junction. No basilar artery stenosis. Normal SCA and PCA origins.  Small posterior communicating arteries. Normal left PCA branches. Mild to moderate right PCA P1 segment stenosis (series 504, image 14). Otherwise normal right PCA branches.  Antegrade flow in both ICA siphons. Motion artifact at the cavernous ICA level. No ICA stenosis suspected. Grossly normal ophthalmic and posterior communicating artery origins.  Patent carotid termini. Normal MCA and ACA origins. Mild motion artifact affecting the bilateral MCA and ACA branches. Anterior communicating artery and visualized ACA branches are within normal limits. Visualized bilateral MCA branches are within normal limits.  IMPRESSION: 1. No acute intracranial abnormality. Mild for age nonspecific cerebral white matter signal changes. 2. Negative intracranial MRA except for mild to moderate right PCA P1 segment stenosis. 3. Partially visible cervical spine degeneration, suspect multilevel mild cervical spinal stenosis.   Electronically Signed   By: Augusto Gamble M.D.   On: 09/17/2013 10:28   Mr Brain Wo Contrast  09/17/2013   CLINICAL DATA:  68 year old female with slurred speech. Left arm tingling. Weakness. Initial encounter.  EXAM: MRI HEAD WITHOUT CONTRAST  MRA HEAD WITHOUT CONTRAST  TECHNIQUE: Multiplanar, multiecho pulse sequences of the brain and surrounding structures were obtained without intravenous contrast. Angiographic images of the head were obtained using MRA technique without contrast.  COMPARISON:  Head CT without contrast 09/16/2013.  FINDINGS: MRI HEAD FINDINGS  No restricted diffusion to suggest acute infarction. No midline shift, mass effect, evidence of mass lesion, ventriculomegaly, extra-axial collection or acute intracranial hemorrhage. Cervicomedullary junction and pituitary are within normal limits. Major intracranial vascular flow voids are preserved.  Patchy nonspecific cerebral white matter T2 and FLAIR hyperintensity, mostly periventricular. No cortical encephalomalacia identified. Deep gray matter  nuclei brainstem and cerebellum within normal limits. Visible internal auditory structures appear normal.  Visualized paranasal sinuses and mastoids are clear. Visualized orbit soft tissues are within normal limits. Normal bone marrow signal. Visualized scalp soft tissues are within normal limits.  Degenerative changes are evident in the cervical spine, including C4-C5 retrolisthesis. 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  MRA HEAD FINDINGS  Antegrade flow in the posterior circulation. Codominant distal vertebral arteries. Normal PICA origins. Patent vertebrobasilar junction. No basilar artery stenosis. Normal SCA and PCA origins. Small posterior communicating arteries. Normal left PCA branches. Mild to moderate right PCA P1 segment stenosis (series 504, image 14). Otherwise normal right PCA branches.  Antegrade flow in both ICA siphons. Motion artifact at the cavernous ICA level. No ICA stenosis suspected. Grossly normal ophthalmic and posterior communicating artery origins.  Patent carotid termini. Normal MCA and ACA origins. Mild motion artifact affecting the bilateral MCA and ACA branches. Anterior communicating artery and visualized ACA branches are within normal limits. Visualized bilateral MCA branches  are within normal limits.  IMPRESSION: 1. No acute intracranial abnormality. Mild for age nonspecific cerebral white matter signal changes. 2. Negative intracranial MRA except for mild to moderate right PCA P1 segment stenosis. 3. Partially visible cervical spine degeneration, suspect multilevel mild cervical spinal stenosis.   Electronically Signed   By: Augusto Gamble M.D.   On: 09/17/2013 10:28   Scheduled Meds: . aspirin  81 mg Oral Daily  . atenolol  12.5 mg Oral Daily  . azithromycin  500 mg Intravenous Q24H  . cefTRIAXone (ROCEPHIN)  IV  1 g Intravenous Q24H  . enoxaparin (LOVENOX) injection  40 mg Subcutaneous Q24H  . guaiFENesin  1,200 mg Oral BID  .  hydrochlorothiazide  12.5 mg Oral Daily  . lisinopril  10 mg Oral Daily  . predniSONE  50 mg Oral Q breakfast  . sodium chloride  3 mL Intravenous Q12H   Continuous Infusions:   Principal Problem:   Near syncope Active Problems:   COPD exacerbation   Smoker  [employer], theatre/television/film [phone]. If 7PM-7AM, please contact night-coverage at [email or web address], password [facility] 09/18/2013, 10:17 AM  LOS: 2 days

## 2013-09-18 NOTE — Care Management Note (Addendum)
    Page 1 of 1   09/19/2013     11:10:55 AM   CARE MANAGEMENT NOTE 09/19/2013  Patient:  Harland DingwallBURNETTE,Dasani L   Account Number:  [id number]  Date Initiated:  09/18/2013  Documentation initiated by:  Lanier ClamMAHABIR,Beyonka Pitney  Subjective/Objective Assessment:   67 Y/O F ADMITTED W/SYNCOPE.     Action/Plan:   FROM HOME.HAS PCP,PHARMACY.   Anticipated DC Date:  09/19/2013   Anticipated DC Plan:  HOME W HOME HEALTH SERVICES      DC Planning Services  CM consult      Choice offered to / List presented to:  C-1 Patient        HH arranged  [facility] PT      [facility]   Status of service:  Completed, signed off Medicare Important Message given?   (If response is "NO", the following Medicare IM given date fields will be blank) Date Medicare IM given:   Date Additional Medicare IM given:    Discharge Disposition:  HOME W HOME HEALTH SERVICES  Per UR Regulation:  Reviewed for med. necessity/level of care/duration of stay  If discussed at Long Length of Stay Meetings, dates discussed:    Comments:  09/19/13 Amarii Bordas RN,BSN NCM [phone] [facility] CHOSEN FOR HHPT.HHPT ORDERED.TC KRISTEN AWARE & FOLLOWING FOR D/C.  09/18/13 Chaka Boyson RN,BSN NCM [phone] NO ANTICIPATED D/C NEEDS.

## 2013-09-18 NOTE — Procedures (Signed)
ELECTROENCEPHALOGRAM REPORT   Patient: Katie Waller       Room #: [facility] 1420 EEG No. ID: [id number] Age: 68 y.o.        Sex: female Referring Physician: Laural BenesJohnson Report Date:  09/18/2013        Interpreting Physician: Thana FarrEYNOLDS,Marshon Bangs D  History: Katie Waller is an 68 y.o. female with episodes of left sided tingling and numbness  Medications:  Scheduled: . aspirin  81 mg Oral Daily  . atenolol  12.5 mg Oral Daily  . azithromycin  500 mg Intravenous Q24H  . cefTRIAXone (ROCEPHIN)  IV  1 g Intravenous Q24H  . enoxaparin (LOVENOX) injection  40 mg Subcutaneous Q24H  . guaiFENesin  1,200 mg Oral BID  . hydrochlorothiazide  12.5 mg Oral Daily  . lisinopril  10 mg Oral Daily  . predniSONE  50 mg Oral Q breakfast  . sodium chloride  3 mL Intravenous Q12H    Conditions of Recording:  This is a 16 channel EEG carried out with the patient in the awake state.  Description:  The waking background activity consists of a low voltage, symmetrical, fairly well organized, 10 Hz alpha activity, seen from the parieto-occipital and posterior temporal regions.  Low voltage fast activity, poorly organized, is seen anteriorly and is at times superimposed on more posterior regions.  A mixture of theta and alpha rhythms are seen from the central and temporal regions. The patient drowses with slowing to irregular, low voltage theta and beta activity.   The patient has 2 episodes of left hand twitching during the tracing.  The patient was able to follow commands during the episodes.  No epileptic correlate was noted.  Background activity was preserved.  One episode of left arm tingling was captured as well.  Again  no epileptic correlate was noted and the background activity was preserved The patient does not drowse or sleep. Hyperventilation and intermittent photic stimulation were not performed.   IMPRESSION: This is a normal awake electroencephalogram.  Two episodes of left hand jerking were captured as  well as one episode of left upper extremity tingling.  No epileptic correlate was noted.    Comment:  An EEG with the patient sleep deprived to elicit drowse and light sleep may be desirable to further elicit a possible seizure disorder.     Thana FarrLeslie Sanye Ledesma, MD [facility] [phone] 09/18/2013, 11:29 AM

## 2013-09-18 NOTE — Progress Notes (Signed)
Subjective: Patient currently doing well.  Has had fewer spells today but still had three during EEG.  No new complaints.  3  Objective: Current vital signs: BP 128/52  Pulse 84  Temp(Src) 98.2 F (36.8 C) (Oral)  Resp 20  Ht 5\' 1"  (1.549 m)  Wt 54.2 kg (119 lb 7.8 oz)  BMI 22.59 kg/m2  SpO2 98% Vital signs in last 24 hours: Temp:  [98.2 F (36.8 C)-98.4 F (36.9 C)] 98.2 F (36.8 C) (01/14 1409) Pulse Rate:  [84-110] 84 (01/14 1409) Resp:  [20-24] 20 (01/14 1409) BP: (125-138)/(49-66) 128/52 mmHg (01/14 1409) SpO2:  [98 %-100 %] 98 % (01/14 1409)  Intake/Output from previous day: 01/13 0701 - 01/14 0700 In: 395 [P.O.:25; IV Piggyback:300] Out: -  Intake/Output this shift:   Nutritional status: Cardiac  Neurologic Exam: Mental Status:  Alert, oriented, thought content appropriate. Speech fluent without evidence of aphasia. Able to follow 3 step commands without difficulty.  Cranial Nerves:  II: Discs flat bilaterally; Visual fields grossly normal, pupils equal, round, reactive to light and accommodation  III,IV, VI: ptosis not present, extra-ocular motions intact bilaterally  V,VII: smile symmetric VIII: hearing normal bilaterally  IX,X: gag reflex present  XI: bilateral shoulder shrug  XII: midline tongue extension without atrophy or fasciculations  Motor:  Able to lift all extremities against gravity and currently using the left preferentially Sensory: Pinprick and light touch intact throughout, bilaterally  Deep Tendon Reflexes:  2+ in the UE's, 3+ KJ's, clonus at the AJ's  Plantars:  Right: downgoing   Left: downgoing   Lab Results: Basic Metabolic Panel:  Recent Labs Lab 09/17/13 0026 09/17/13 0430 09/18/13 0614  NA 138 139 136*  K 4.8 4.5 4.1  CL 98 101 98  CO2 26 26 25   GLUCOSE 128* 131* 123*  BUN 13 10 13   CREATININE 0.71 0.67 0.78  CALCIUM 9.5 8.5 9.6    Liver Function Tests:  Recent Labs Lab 09/17/13 0026 09/17/13 0430 09/18/13 0614   AST 17 18 15   ALT 12 11 13   ALKPHOS 68 62 61  BILITOT <0.2* 0.2* 0.3  PROT 7.5 6.7 7.4  ALBUMIN 3.9 3.5 3.7    Recent Labs Lab 09/17/13 0026  LIPASE 39   No results found for this basename: AMMONIA,  in the last 168 hours  CBC:  Recent Labs Lab 09/17/13 0026 09/17/13 0430  WBC 13.2* 12.7*  NEUTROABS 9.5*  --   HGB 13.6 12.6  HCT 40.1 38.1  MCV 87.0 87.6  PLT 352 338    Cardiac Enzymes:  Recent Labs Lab 09/17/13 0026 09/17/13 0756 09/17/13 1358  TROPONINI <0.30 <0.30 <0.30    Lipid Panel:  Recent Labs Lab 09/18/13 0614  CHOL 169  TRIG 134  HDL 76  CHOLHDL 2.2  VLDL 27  LDLCALC 66    CBG: No results found for this basename: GLUCAP,  in the last 168 hours  Microbiology: Results for orders placed during the hospital encounter of 09/16/13  MRSA PCR SCREENING     Status: None   Collection Time    09/17/13  6:40 AM      Result Value Range Status   MRSA by PCR NEGATIVE  NEGATIVE Final   Comment:            The GeneXpert MRSA Assay (FDA     approved for NASAL specimens     only), is one component of a     comprehensive MRSA colonization  surveillance program. It is not     intended to diagnose MRSA     infection nor to guide or     monitor treatment for     MRSA infections.    Coagulation Studies:  Recent Labs  09/17/13 0430  LABPROT 12.7  INR 0.97    Imaging: Dg Chest 2 View  09/17/2013   CLINICAL DATA:  Week, nausea.  Recent diagnosis of bronchitis.  EXAM: CHEST  2 VIEW  COMPARISON:  Chest radiograph report August 19, 2001  FINDINGS: Cardiac silhouette is unremarkable, moderately calcified aortic knob, mediastinal silhouette is otherwise unremarkable. Mild chronic interstitial changes with increased lung volumes, mild flattening of the hemidiaphragms. No pleural effusions or focal consolidations. No pneumothorax.  Patient is osteopenic. Soft tissue planes are unremarkable. Multiple EKG lines overlie the patient and may obscure  subtle underlying pathology.  IMPRESSION: Mild COPD.   Electronically Signed   By: Awilda Metro   On: 09/17/2013 00:38   Ct Head Wo Contrast  09/17/2013   CLINICAL DATA:  Dizziness and weakness.  EXAM: CT HEAD WITHOUT CONTRAST  TECHNIQUE: Contiguous axial images were obtained from the base of the skull through the vertex without intravenous contrast.  COMPARISON:  None available for comparison at time of study interpretation.  FINDINGS: The ventricles and sulci are normal . No intraparenchymal hemorrhage, mass effect nor midline shift. Minimal patchy supratentorial white matter hypodensities are within normal range for patient's age and though non-specific suggest sequelae of chronic small vessel ischemic disease. No acute large vascular territory infarcts.  No abnormal extra-axial fluid collections. Basal cisterns are patent. Moderate calcific atherosclerosis of the carotid siphons.  No skull fracture. Subcentimeter nonaggressive ground-glass lesion in the left occipital calvarium involving the diploic space and outer table may reflect an epidermal inclusion cyst. Visualized paranasal sinuses and mastoid aircells are well-aerated. The included ocular globes and orbital contents are non-suspicious.  IMPRESSION: No acute intracranial process. Normal noncontrast CT of the head for age.   Electronically Signed   By: Awilda Metro   On: 09/17/2013 00:41   Mr Maxine Glenn Head Wo Contrast  09/17/2013   CLINICAL DATA:  67 year old female with slurred speech. Left arm tingling. Weakness. Initial encounter.  EXAM: MRI HEAD WITHOUT CONTRAST  MRA HEAD WITHOUT CONTRAST  TECHNIQUE: Multiplanar, multiecho pulse sequences of the brain and surrounding structures were obtained without intravenous contrast. Angiographic images of the head were obtained using MRA technique without contrast.  COMPARISON:  Head CT without contrast 09/16/2013.  FINDINGS: MRI HEAD FINDINGS  No restricted diffusion to suggest acute infarction. No  midline shift, mass effect, evidence of mass lesion, ventriculomegaly, extra-axial collection or acute intracranial hemorrhage. Cervicomedullary junction and pituitary are within normal limits. Major intracranial vascular flow voids are preserved.  Patchy nonspecific cerebral white matter T2 and FLAIR hyperintensity, mostly periventricular. No cortical encephalomalacia identified. Deep gray matter nuclei brainstem and cerebellum within normal limits. Visible internal auditory structures appear normal.  Visualized paranasal sinuses and mastoids are clear. Visualized orbit soft tissues are within normal limits. Normal bone marrow signal. Visualized scalp soft tissues are within normal limits.  Degenerative changes are evident in the cervical spine, including C4-C5 retrolisthesis. 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  MRA HEAD FINDINGS  Antegrade flow in the posterior circulation. Codominant distal vertebral arteries. Normal PICA origins. Patent vertebrobasilar junction. No basilar artery stenosis. Normal SCA and PCA origins. Small posterior communicating arteries. Normal left PCA branches. Mild to moderate right PCA P1 segment stenosis (  series 504, image 14). Otherwise normal right PCA branches.  Antegrade flow in both ICA siphons. Motion artifact at the cavernous ICA level. No ICA stenosis suspected. Grossly normal ophthalmic and posterior communicating artery origins.  Patent carotid termini. Normal MCA and ACA origins. Mild motion artifact affecting the bilateral MCA and ACA branches. Anterior communicating artery and visualized ACA branches are within normal limits. Visualized bilateral MCA branches are within normal limits.  IMPRESSION: 1. No acute intracranial abnormality. Mild for age nonspecific cerebral white matter signal changes. 2. Negative intracranial MRA except for mild to moderate right PCA P1 segment stenosis. 3. Partially visible cervical spine degeneration,  suspect multilevel mild cervical spinal stenosis.   Electronically Signed   By: Augusto Gamble M.D.   On: 09/17/2013 10:28   Mr Brain Wo Contrast  09/17/2013   CLINICAL DATA:  68 year old female with slurred speech. Left arm tingling. Weakness. Initial encounter.  EXAM: MRI HEAD WITHOUT CONTRAST  MRA HEAD WITHOUT CONTRAST  TECHNIQUE: Multiplanar, multiecho pulse sequences of the brain and surrounding structures were obtained without intravenous contrast. Angiographic images of the head were obtained using MRA technique without contrast.  COMPARISON:  Head CT without contrast 09/16/2013.  FINDINGS: MRI HEAD FINDINGS  No restricted diffusion to suggest acute infarction. No midline shift, mass effect, evidence of mass lesion, ventriculomegaly, extra-axial collection or acute intracranial hemorrhage. Cervicomedullary junction and pituitary are within normal limits. Major intracranial vascular flow voids are preserved.  Patchy nonspecific cerebral white matter T2 and FLAIR hyperintensity, mostly periventricular. No cortical encephalomalacia identified. Deep gray matter nuclei brainstem and cerebellum within normal limits. Visible internal auditory structures appear normal.  Visualized paranasal sinuses and mastoids are clear. Visualized orbit soft tissues are within normal limits. Normal bone marrow signal. Visualized scalp soft tissues are within normal limits.  Degenerative changes are evident in the cervical spine, including C4-C5 retrolisthesis. 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  MRA HEAD FINDINGS  Antegrade flow in the posterior circulation. Codominant distal vertebral arteries. Normal PICA origins. Patent vertebrobasilar junction. No basilar artery stenosis. Normal SCA and PCA origins. Small posterior communicating arteries. Normal left PCA branches. Mild to moderate right PCA P1 segment stenosis (series 504, image 14). Otherwise normal right PCA branches.  Antegrade flow in  both ICA siphons. Motion artifact at the cavernous ICA level. No ICA stenosis suspected. Grossly normal ophthalmic and posterior communicating artery origins.  Patent carotid termini. Normal MCA and ACA origins. Mild motion artifact affecting the bilateral MCA and ACA branches. Anterior communicating artery and visualized ACA branches are within normal limits. Visualized bilateral MCA branches are within normal limits.  IMPRESSION: 1. No acute intracranial abnormality. Mild for age nonspecific cerebral white matter signal changes. 2. Negative intracranial MRA except for mild to moderate right PCA P1 segment stenosis. 3. Partially visible cervical spine degeneration, suspect multilevel mild cervical spinal stenosis.   Electronically Signed   By: Augusto Gamble M.D.   On: 09/17/2013 10:28    Medications:  I have reviewed the patient's current medications. Scheduled: . aspirin  81 mg Oral Daily  . atenolol  12.5 mg Oral Daily  . azithromycin  500 mg Intravenous Q24H  . cefTRIAXone (ROCEPHIN)  IV  1 g Intravenous Q24H  . enoxaparin (LOVENOX) injection  40 mg Subcutaneous Q24H  . guaiFENesin  1,200 mg Oral BID  . hydrochlorothiazide  12.5 mg Oral Daily  . lisinopril  10 mg Oral Daily  . predniSONE  50 mg Oral Q breakfast  . sodium  chloride  3 mL Intravenous Q12H    Assessment/Plan: Patient currently stable.  MRI of the brain reviewed and shows no acute changes.  No large vessel ischemic events noted, new or chronic.  EEG is normal despite capturing multiple episodes.  Unclear what is causing current events but do not suspect TIA or seizure.    Recommendations: 1.  Continue ASA 2.  Follow up with neurology as an outpatient.  No further neurologic intervention is recommended at this time.  If further questions arise, please call or page at that time.  Thank you for allowing neurology to participate in the care of this patient.    LOS: 2 days   Thana FarrLeslie Decarla Siemen, MD [facility] [phone] 09/18/2013  7:22 PM

## 2013-09-18 NOTE — Evaluation (Signed)
Physical Therapy Evaluation Patient Details Name: Katie DingwallVelma L Mish MRN: [id number] DOB: 1946-02-29 Today's Date: 09/18/2013 Time: [id number] PT Time Calculation (min): 30 min  PT Assessment / Plan / Recommendation History of Present Illness  pt in with c/o L sided weakness and some slurred speech at times. wtih recent chest cold as well. MRA with normal findings per chart.   Clinical Impression  Pt with intentional motor planning weaknesses and deficits causing difficulties with movement at times with LUE and LLE. To benefit from continued PT.     PT Assessment  Patient needs continued PT services    Follow Up Recommendations  Home health PT    Does the patient have the potential to tolerate intense rehabilitation      Barriers to Discharge        Equipment Recommendations  None recommended by PT    Recommendations for Other Services     Frequency Min 3X/week    Precautions / Restrictions     Pertinent Vitals/Pain Denies pain      Mobility  Bed Mobility Overal bed mobility: Independent Transfers Overall transfer level: Independent Equipment used: None Ambulation/Gait Ambulation/Gait assistance: Supervision Ambulation Distance (Feet): 25 Feet (pt wanted to stay in the room , I was assessing her gait in small areas) Assistive device: None Gait Pattern/deviations: WFL(Within Functional Limits) Gait velocity: she had step through pattern, no noticable foot lag, however with intentional comands to move the L LE or UE she got very frustrated and had difficulty performing the task asked the more she focused on it. "intentional motor planning was difficult"    Exercises     PT Diagnosis: Difficulty walking;Generalized weakness  PT Problem List: Decreased activity tolerance;Decreased strength;Decreased balance;Decreased mobility;Decreased knowledge of use of DME PT Treatment Interventions: Functional mobility training;Therapeutic activities;DME instruction;Gait  training;Therapeutic exercise;Neuromuscular re-education     PT Goals(Current goals can be found in the care plan section) Acute Rehab PT Goals Patient Stated Goal: to get this resolved before I go home PT Goal Formulation: With patient Time For Goal Achievement: 10/02/13 Potential to Achieve Goals: Good  Visit Information  Last PT Received On: 09/18/13 Assistance Needed: +1 History of Present Illness: pt in with c/o L sided weakness and some slurred speech at times. wtih recent chest cold as well. MRA with normal findings per chart.        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 Seemed to function at a general good strength level, however intentional testing seemed to always be decreased on L versus right at level of 3+/5 generally. Tested, DF, Hip flex, knee ext, PF and DF) LLE Deficits / Details: Pt a little inconsistent with testing. Seemed to function at a general good strength level, however intentional testing seemed to always be decreased on L versus right at level of 3+/5 generally. Tested, DF,  Hip flex, knee ext, PF and DF LLE Sensation: decreased light  touch   Balance General Comments General comments (skin integrity, edema, etc.): Also tested pt's UEs while sitting EOB: Pt able to perfrom B shoulder elevation with abduction and rotate palms up with no difficulty. BUE shoulder elevation =in front for hand drip was postive (+). When asked to perfrom B grip , L was much weaker.. Pt was able to perfform finger to finger fine motor test on R hand , but when attempted with L hand she could not and her hand began to [facility] and she was very frustrated adn began to cry.   End of Session PT - End of Session Activity Tolerance: Patient tolerated treatment well (frustrated not knowing what is going on with her Left side, however functional skills like moving covers in bed, or her gown was perfromed very smoothly with the L hand when she was no asked to perfomr it. So seems possibly to be more "intentional motor ) Patient left: in bed;with family/visitor present Nurse Communication: Mobility status  GP     Marella Bile 09/18/2013, 5:15 PM Marella Bile, PT Pager: [phone] 09/18/2013

## 2013-09-18 NOTE — Progress Notes (Signed)
EEG completed; results pending.    

## 2013-09-19 DIAGNOSIS — R9431 Abnormal electrocardiogram [ECG] [EKG]: Secondary | ICD-10-CM

## 2013-09-19 DIAGNOSIS — R4781 Slurred speech: Secondary | ICD-10-CM | POA: Diagnosis present

## 2013-09-19 DIAGNOSIS — R7303 Prediabetes: Secondary | ICD-10-CM

## 2013-09-19 DIAGNOSIS — R202 Paresthesia of skin: Secondary | ICD-10-CM | POA: Diagnosis present

## 2013-09-19 HISTORY — DX: Prediabetes: R73.03

## 2013-09-19 HISTORY — DX: Abnormal electrocardiogram (ECG) (EKG): R94.31

## 2013-09-19 MED ORDER — ASPIRIN 81 MG PO CHEW: 81.0000 mg | CHEWABLE_TABLET | Freq: Every day | ORAL | Status: DC

## 2013-09-19 MED ORDER — ALBUTEROL SULFATE HFA 108 (90 BASE) MCG/ACT IN AERS: 2.0000 | INHALATION_SPRAY | Freq: Four times a day (QID) | RESPIRATORY_TRACT | Status: DC | PRN

## 2013-09-19 MED ORDER — LISINOPRIL-HYDROCHLOROTHIAZIDE 10-12.5 MG PO TABS: 1.0000 | ORAL_TABLET | Freq: Every day | ORAL | Status: DC

## 2013-09-19 MED ORDER — LEVOFLOXACIN 500 MG PO TABS: 500.0000 mg | ORAL_TABLET | Freq: Every day | ORAL | Status: DC

## 2013-09-19 MED ORDER — ATENOLOL 25 MG PO TABS: 12.5000 mg | ORAL_TABLET | Freq: Every day | ORAL | Status: DC

## 2013-09-19 NOTE — Discharge Summary (Signed)
Physician Discharge Summary  Katie DingwallVelma L Waller ZOX:[id number] DOB: [birth year] DOA: 09/16/2013  PCP: Lolita PatellaEADE,ROBERT ALEXANDER, MD  Admit date: 09/16/2013 Discharge date: 09/19/2013  Recommendations for Outpatient Follow-up:  1. Please work on continued smoking cessation 2. Please repeat BMP on outpatient follow up  3. Please arrange outpatient neurology follow up   Discharge Diagnoses:  Principal Problem:   Near syncope Active Problems:   COPD exacerbation   Smoker   Sinus tachycardia   Unspecified essential hypertension   Abnormal finding on EKG   Prediabetes (A1c 6.1%)   Discharge Condition: stable   Diet recommendation: heart healthy, carb modified   Filed Weights   09/17/13 0621  Weight: 119 lb 7.8 oz (54.2 kg)    History of present illness:  Katie Waller is a 68 y.o. female with Past medical history of active smoker chronic bronchitis.  The patient is coming from home.  The patient is family brought the patient of the hospital as they found her in the house in her room sitting on a chair unable to move her body. The patient mentions that she has been having some cough congestion and has been seen by her PCP earlier this morning we'll start her on azithromycin and Mucinex with over the counter antihistaminics and she has taken them in the she started feeling some tiredness and dizziness and sit down on her chair. She felt generalized weakness somewhat so that she was not able to lift herself on her own and when the daughter is tried to lift it up she actually had an episode of vomiting as well. She complain of dizziness at that time as well and some slurred speech.  At present the patient appears at her baseline as per the family. No fever, chills, headache, cough, chest pain, palpitation, shortness of breath, orthopnea, PND, nausea, vomiting, abdominal pain, diarrhea, constipation, active bleeding, burning urination, dizziness, pedal edema, focal neurological deficit.    Hospital Course:  1. Near syncope  The patient is presenting with an episode of near syncope. Her initial lab work including troponins CT scan of the head as well as an EKG does not show any acute abnormality. Also her hemodynamic parameters are also within normal limits. There was a complaint of slurred speech which is completely resolved at present which could also her possible TIA for which she will get an MRI, echocardiogram, carotid Doppler.  These tests came back essentially negative.  She also could have had an episode of hypoxia due to her ongoing complaint of cough and congestion with her history of active smoking and possible COPD. monitored on telemetry and no sig events reported.    Possible seizure activity - Pt had an EEG study that was negative for seizure activity.  Appreciate Neuro consult and workup. Please see consultation notes.  They recommend outpatient neurology followup.  PT eval ordered and they recommend Home Health PT which has been arranged.   Pt has been started on aspirin 81 mg daily.   2. COPD exacerbation  The patient has been active smoking. She does not have any prior diagnosis of COPD but her chest x-ray appears slight COPD. She had expiratory wheezes on exam. She will be discharged on inhalers and will complete course of levaquin.    3. Hypertension and tachycardia - Pt's blood pressure has started to improve on antihypertensives. Continue lisinopril, HCTZ, atenolol, and counseled patient that she will need close outpatient follow up with her PCP.   4. Prediabetes - counseling offered on diet  and exercise  DVT Prophylaxis: subcutaneous Heparin   Nutrition: Advance as tolerated   Code Status: Full  Family Communication: Family was present at bedside, opportunity was given to ask question and all questions were answered satisfactorily at the time of interview.  Disposition: pending neuro recs   Consultations:  neurology  Discharge Exam: Pt without  complaints.  She says she feels much better.  No syncopal events.  No CP and No SOB.  Filed Vitals:   09/19/13 0500  BP: 115/65  Pulse: 90  Temp: 98.7 F (37.1 C)  Resp: 16   General: awake, alert, no distress Cardiovascular: normal s1, s2 sounds  Respiratory: BBS clear to auscultation   Discharge Instructions  Discharge Orders   Future Orders Complete By Expires   Discontinue IV  As directed        Medication List    STOP taking these medications       azithromycin 250 MG tablet  Commonly known as:  ZITHROMAX     dextromethorphan 30 MG/5ML liquid  Commonly known as:  DELSYM      TAKE these medications       albuterol 108 (90 BASE) MCG/ACT inhaler  Commonly known as:  PROVENTIL HFA;VENTOLIN HFA  Inhale 2 puffs into the lungs every 6 (six) hours as needed for wheezing or shortness of breath.     aspirin 81 MG chewable tablet  Chew 1 tablet (81 mg total) by mouth daily.     atenolol 25 MG tablet  Commonly known as:  TENORMIN  Take 0.5 tablets (12.5 mg total) by mouth daily.     guaiFENesin 600 MG 12 hr tablet  Commonly known as:  MUCINEX  Take 1,200 mg by mouth 2 (two) times daily.     levofloxacin 500 MG tablet  Commonly known as:  LEVAQUIN  Take 1 tablet (500 mg total) by mouth daily.  Start taking on:  09/20/2013     lisinopril-hydrochlorothiazide 10-12.5 MG per tablet  Commonly known as:  ZESTORETIC  Take 1 tablet by mouth daily.       Allergies  Allergen Reactions  . Codeine Nausea And Vomiting       Follow-up Information   Follow up with Lolita Patella, MD. Schedule an appointment as soon as possible for a visit in 1 week. [facility] Followup )    Specialty:  Family Medicine   Contact information:   [phone] [place] [address] [phone]       Follow up with [facility]. Schedule an appointment as soon as possible for a visit in 2 weeks. (Follow Up from Hospitalization )    Contact  information:   [address] [place] [address] [phone]      The results of significant diagnostics from this hospitalization (including imaging, microbiology, ancillary and laboratory) are listed below for reference.    Significant Diagnostic Studies: Dg Chest 2 View  09/17/2013   CLINICAL DATA:  Week, nausea.  Recent diagnosis of bronchitis.  EXAM: CHEST  2 VIEW  COMPARISON:  Chest radiograph report August 19, 2001  FINDINGS: Cardiac silhouette is unremarkable, moderately calcified aortic knob, mediastinal silhouette is otherwise unremarkable. Mild chronic interstitial changes with increased lung volumes, mild flattening of the hemidiaphragms. No pleural effusions or focal consolidations. No pneumothorax.  Patient is osteopenic. Soft tissue planes are unremarkable. Multiple EKG lines overlie the patient and may obscure subtle underlying pathology.  IMPRESSION: Mild COPD.   Electronically Signed  By: Awilda Metro   On: 09/17/2013 00:38   Ct Head Wo Contrast  09/17/2013   CLINICAL DATA:  Dizziness and weakness.  EXAM: CT HEAD WITHOUT CONTRAST  TECHNIQUE: Contiguous axial images were obtained from the base of the skull through the vertex without intravenous contrast.  COMPARISON:  None available for comparison at time of study interpretation.  FINDINGS: The ventricles and sulci are normal . No intraparenchymal hemorrhage, mass effect nor midline shift. Minimal patchy supratentorial white matter hypodensities are within normal range for patient's age and though non-specific suggest sequelae of chronic small vessel ischemic disease. No acute large vascular territory infarcts.  No abnormal extra-axial fluid collections. Basal cisterns are patent. Moderate calcific atherosclerosis of the carotid siphons.  No skull fracture. Subcentimeter nonaggressive ground-glass lesion in the left occipital calvarium involving the diploic space and outer table may reflect an epidermal inclusion  cyst. Visualized paranasal sinuses and mastoid aircells are well-aerated. The included ocular globes and orbital contents are non-suspicious.  IMPRESSION: No acute intracranial process. Normal noncontrast CT of the head for age.   Electronically Signed   By: Awilda Metro   On: 09/17/2013 00:41   Mr Maxine Glenn Head Wo Contrast  09/17/2013   CLINICAL DATA:  68 year old female with slurred speech. Left arm tingling. Weakness. Initial encounter.  EXAM: MRI HEAD WITHOUT CONTRAST  MRA HEAD WITHOUT CONTRAST  TECHNIQUE: Multiplanar, multiecho pulse sequences of the brain and surrounding structures were obtained without intravenous contrast. Angiographic images of the head were obtained using MRA technique without contrast.  COMPARISON:  Head CT without contrast 09/16/2013.  FINDINGS: MRI HEAD FINDINGS  No restricted diffusion to suggest acute infarction. No midline shift, mass effect, evidence of mass lesion, ventriculomegaly, extra-axial collection or acute intracranial hemorrhage. Cervicomedullary junction and pituitary are within normal limits. Major intracranial vascular flow voids are preserved.  Patchy nonspecific cerebral white matter T2 and FLAIR hyperintensity, mostly periventricular. No cortical encephalomalacia identified. Deep gray matter nuclei brainstem and cerebellum within normal limits. Visible internal auditory structures appear normal.  Visualized paranasal sinuses and mastoids are clear. Visualized orbit soft tissues are within normal limits. Normal bone marrow signal. Visualized scalp soft tissues are within normal limits.  Degenerative changes are evident in the cervical spine, including C4-C5 retrolisthesis. 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  MRA HEAD FINDINGS  Antegrade flow in the posterior circulation. Codominant distal vertebral arteries. Normal PICA origins. Patent vertebrobasilar junction. No basilar artery stenosis. Normal SCA and PCA origins. Small  posterior communicating arteries. Normal left PCA branches. Mild to moderate right PCA P1 segment stenosis (series 504, image 14). Otherwise normal right PCA branches.  Antegrade flow in both ICA siphons. Motion artifact at the cavernous ICA level. No ICA stenosis suspected. Grossly normal ophthalmic and posterior communicating artery origins.  Patent carotid termini. Normal MCA and ACA origins. Mild motion artifact affecting the bilateral MCA and ACA branches. Anterior communicating artery and visualized ACA branches are within normal limits. Visualized bilateral MCA branches are within normal limits.  IMPRESSION: 1. No acute intracranial abnormality. Mild for age nonspecific cerebral white matter signal changes. 2. Negative intracranial MRA except for mild to moderate right PCA P1 segment stenosis. 3. Partially visible cervical spine degeneration, suspect multilevel mild cervical spinal stenosis.   Electronically Signed   By: Augusto Gamble M.D.   On: 09/17/2013 10:28   Mr Brain Wo Contrast  09/17/2013   CLINICAL DATA:  68 year old female with slurred speech. Left arm tingling. Weakness. Initial encounter.  EXAM: MRI HEAD WITHOUT CONTRAST  MRA HEAD WITHOUT CONTRAST  TECHNIQUE: Multiplanar, multiecho pulse sequences of the brain and surrounding structures were obtained without intravenous contrast. Angiographic images of the head were obtained using MRA technique without contrast.  COMPARISON:  Head CT without contrast 09/16/2013.  FINDINGS: MRI HEAD FINDINGS  No restricted diffusion to suggest acute infarction. No midline shift, mass effect, evidence of mass lesion, ventriculomegaly, extra-axial collection or acute intracranial hemorrhage. Cervicomedullary junction and pituitary are within normal limits. Major intracranial vascular flow voids are preserved.  Patchy nonspecific cerebral white matter T2 and FLAIR hyperintensity, mostly periventricular. No cortical encephalomalacia identified. Deep gray matter nuclei  brainstem and cerebellum within normal limits. Visible internal auditory structures appear normal.  Visualized paranasal sinuses and mastoids are clear. Visualized orbit soft tissues are within normal limits. Normal bone marrow signal. Visualized scalp soft tissues are within normal limits.  Degenerative changes are evident in the cervical spine, including C4-C5 retrolisthesis. 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  MRA HEAD FINDINGS  Antegrade flow in the posterior circulation. Codominant distal vertebral arteries. Normal PICA origins. Patent vertebrobasilar junction. No basilar artery stenosis. Normal SCA and PCA origins. Small posterior communicating arteries. Normal left PCA branches. Mild to moderate right PCA P1 segment stenosis (series 504, image 14). Otherwise normal right PCA branches.  Antegrade flow in both ICA siphons. Motion artifact at the cavernous ICA level. No ICA stenosis suspected. Grossly normal ophthalmic and posterior communicating artery origins.  Patent carotid termini. Normal MCA and ACA origins. Mild motion artifact affecting the bilateral MCA and ACA branches. Anterior communicating artery and visualized ACA branches are within normal limits. Visualized bilateral MCA branches are within normal limits.  IMPRESSION: 1. No acute intracranial abnormality. Mild for age nonspecific cerebral white matter signal changes. 2. Negative intracranial MRA except for mild to moderate right PCA P1 segment stenosis. 3. Partially visible cervical spine degeneration, suspect multilevel mild cervical spinal stenosis.   Electronically Signed   By: Augusto Gamble M.D.   On: 09/17/2013 10:28   Microbiology: Recent Results (from the past 240 hour(s))  MRSA PCR SCREENING     Status: None   Collection Time    09/17/13  6:40 AM      Result Value Range Status   MRSA by PCR NEGATIVE  NEGATIVE Final   Comment:            The GeneXpert MRSA Assay (FDA     approved for NASAL  specimens     only), is one component of a     comprehensive MRSA colonization     surveillance program. It is not     intended to diagnose MRSA     infection nor to guide or     monitor treatment for     MRSA infections.    Labs: Basic Metabolic Panel:  Recent Labs Lab 09/17/13 0026 09/17/13 0430 09/18/13 0614  NA 138 139 136*  K 4.8 4.5 4.1  CL 98 101 98  CO2 26 26 25   GLUCOSE 128* 131* 123*  BUN 13 10 13   CREATININE 0.71 0.67 0.78  CALCIUM 9.5 8.5 9.6   Liver Function Tests:  Recent Labs Lab 09/17/13 0026 09/17/13 0430 09/18/13 0614  AST 17 18 15   ALT 12 11 13   ALKPHOS 68 62 61  BILITOT <0.2* 0.2* 0.3  PROT 7.5 6.7 7.4  ALBUMIN 3.9 3.5 3.7    Recent Labs Lab 09/17/13 0026  LIPASE 39   No results  found for this basename: AMMONIA,  in the last 168 hours CBC:  Recent Labs Lab 09/17/13 0026 09/17/13 0430  WBC 13.2* 12.7*  NEUTROABS 9.5*  --   HGB 13.6 12.6  HCT 40.1 38.1  MCV 87.0 87.6  PLT 352 338   Cardiac Enzymes:  Recent Labs Lab 09/17/13 0026 09/17/13 0756 09/17/13 1358  TROPONINI <0.30 <0.30 <0.30   BNP: BNP (last 3 results) No results found for this basename: PROBNP,  in the last 8760 hours CBG: No results found for this basename: GLUCAP,  in the last 168 hours  Signed:  Bora Bost  [facility] 09/19/2013, 10:02 AM

## 2013-09-19 NOTE — Evaluation (Signed)
Occupational Therapy Evaluation Patient Details Name: Katie Waller MRN: [id number] DOB: Sep 20, 1945 Today's Date: 09/19/2013 Time: [id number] and 1029 - 1033 OT Time Calculation (min): 17 min  OT Assessment / Plan / Recommendation History of present illness pt in with c/o L sided weakness and some slurred speech at times. wtih recent chest cold as well. MRA with normal findings per chart.    Clinical Impression   Pt seen for initial evaluation.  She had some decreased balance initially and still has weakness in LUE.  Provided HEP.  Pt plans home with daughter today.  Recommend HHOT continue with her.    OT Assessment       Follow Up Recommendations  Home health OT    Barriers to Discharge      Equipment Recommendations   (may want to consider tub seat; min guard standing if not)    Recommendations for Other Services    Frequency       Precautions / Restrictions Precautions Precautions: Fall Restrictions Weight Bearing Restrictions: No   Pertinent Vitals/Pain No pain    ADL  Toilet Transfer: Minimal assistance;Min guard Toilet Transfer Method: Sit to stand Toilet Transfer Equipment: Comfort height toilet Transfers/Ambulation Related to ADLs: ambulated to bathroom with min guard.  Initially min A for first few steps as pt was unsteady.:  caught self ADL Comments: Set up for adls, sit to stand  Pt doesn't not have tub seat but states daughter will be right there with her. She did not feel her balance was any different    OT Diagnosis:   General weakness OT Problem List:   decreased strength, functional use of UE, decreased balance OT Treatment Interventions:     OT Goals(Current goals can be found in the care plan section) Acute Rehab OT Goals Patient Stated Goal: go home  Visit Information  Last OT Received On: 09/19/13 Assistance Needed: +1 History of Present Illness: pt in with c/o L sided weakness and some slurred speech at times. wtih recent chest cold as  well. MRA with normal findings per chart.        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 Has difficulty opposing beyond 2nd digit.  Pushes herself until she shakes:  educated to do only what she can do comfortably and back off     Mobility Transfers Overall transfer level: Needs assistance Equipment used: None Transfers: Sit to/from Stand Sit to Stand: Supervision     Exercise     Balance Balance Overall balance assessment: Needs assistance High Level Balance Comments: min assist to min guard for ambulation General Comments General comments (skin integrity, edema, etc.): provided HEP focusing on gross motor with yellow putty, foam for toothbrushing and self feeding, with L dominant hand as she tends to use R more.  Most activities are bilaterally.  Pt getting ready to leave.  Did not want to practice, but educated and demonstrated with my hand   End of Session OT - End of Session  Activity Tolerance: Patient tolerated treatment well Patient left: in chair;with call bell/phone within reach;with family/visitor present  GO     Holle Sprick 09/19/2013, 10:41 AM Marica Otter, OTR/L [phone] 09/19/2013

## 2013-09-19 NOTE — Discharge Instructions (Signed)
How to Take Your Blood Pressure  These instructions are only for electronic home blood pressure machines. You will need:   An automatic or semi-automatic blood pressure machine.  Fresh batteries for the blood pressure machine. HOW DO I USE THESE TOOLS TO CHECK MY BLOOD PRESSURE?   There are 2 numbers that make up your blood pressure. For example: 120/80.  The first number (120 in our example) is called the "systolic pressure." It is a measure of the pressure in your blood vessels when your heart is pumping blood.  The second number (80 in our example) is called the "diastolic pressure." It is a measure of the pressure in your blood vessels when your heart is resting between beats.  Before you buy a home blood pressure machine, check the size of your arm so you can buy the right size cuff. Here is how to check the size of your arm:  Use a tape measure that shows both inches and centimeters.  Wrap the tape measure around the middle upper part of your arm. You may need someone to help you measure right.  Write down your arm measurement in both inches and centimeters.  To measure your blood pressure right, it is important to have the right size cuff.  If your arm is up to 13 inches (37 to 34 centimeters), get an adult cuff size.  If your arm is 13 to 17 inches (35 to 44 centimeters), get a large adult cuff size.  If your arm is 17 to 20 inches (45 to 52 centimeters), get an adult thigh cuff.  Try to rest or relax for at least 30 minutes before you check your blood pressure.  Do not smoke.  Do not have any drinks with caffeine, such as:  Pop.  Coffee.  Tea.  Check your blood pressure in a quiet room.  Sit down and stretch out your arm on a table. Keep your arm at about the level of your heart. Let your arm relax. GETTING BLOOD PRESSURE READINGS  Make sure you remove any tight-fighting clothing from your arm. Wrap the cuff around your upper arm. Wrap it just above the bend,  and above where you felt the pulse. You should be able to slip a finger between the cuff and your arm. If you cannot slip a finger in the cuff, it is too tight and should be removed and rewrapped.  Some units requires you to manually pump up the arm cuff.  Automatic units inflate the cuff when you press a button.  Cuff deflation is automatic in both models.  After the cuff is inflated, the unit measures your blood pressure and pulse. The readings are displayed on a monitor. Hold still and breathe normally while the cuff is inflated.  Getting a reading takes less than a minute.  Some models store readings in a memory. Some provide a printout of readings.  Get readings at different times of the day. You should wait at least 5 minutes between readings. Take readings with you to your next doctor's visit. Document Released: 08/04/2008 Document Revised: 11/14/2011 Document Reviewed: 08/04/2008 [facility] Patient Information 2014 [place], [place].  Hypertension Hypertension is another name for high blood pressure. High blood pressure may mean that your heart needs to work harder to pump blood. Blood pressure consists of two numbers, which includes a higher number over a lower number (example: 110/72). HOME CARE   Make lifestyle changes as told by your doctor. This may include weight loss and exercise.  Take your blood pressure medicine every day.  Limit how much salt you use.  Stop smoking if you smoke.  Do not use drugs.  Talk to your doctor if you are using decongestants or birth control pills. These medicines might make blood pressure higher.  Females should not drink more than 1 alcoholic drink per day. Males should not drink more than 2 alcoholic drinks per day.  See your doctor as told. GET HELP RIGHT AWAY IF:   You have a blood pressure reading with a top number of 180 or higher.  You get a very bad headache.  You get blurred or changing vision.  You feel confused.  You  feel weak, numb, or faint.  You get chest or belly (abdominal) pain.  You throw up (vomit).  You cannot breathe very well. MAKE SURE YOU:   Understand these instructions.  Will watch your condition.  Will get help right away if you are not doing well or get worse. Document Released: 02/08/2008 Document Revised: 11/14/2011 Document Reviewed: 02/08/2008 [facility] Patient Information 2014 [place], [place].  How to Use an Inhaler Using your inhaler correctly is very important. Good technique will make sure that the medicine reaches your lungs.  HOW TO USE AN INHALER: 1. Take the cap off the inhaler. 2. If this is the first time using your inhaler, you need to prime it. Shake the inhaler for 5 seconds. Release four puffs into the air, away from your face. Ask your doctor for help if you have questions. 3. Shake the inhaler for 5 seconds. 4. Turn the inhaler so the bottle is above the mouthpiece. 5. Put your pointer finger on top of the bottle. Your thumb holds the bottom of the inhaler. 6. Open your mouth. 7. Either hold the inhaler away from your mouth (the width of 2 fingers) or place your lips tightly around the mouthpiece. Ask your doctor which way to use your inhaler. 8. Breathe out as much air as possible. 9. Breathe in and push down on the bottle 1 time to release the medicine. You will feel the medicine go in your mouth and throat. 10. Continue to take a deep breath in very slowly. Try to fill your lungs. 11. After you have breathed in completely, hold your breath for 10 seconds. This will help the medicine to settle in your lungs. If you cannot hold your breath for 10 seconds, hold it for as long as you can before you breathe out. 12. Breathe out slowly, through pursed lips. Whistling is an example of pursed lips. 13. If your doctor has told you to take more than 1 puff, wait at least 15 30 seconds between puffs. This will help you get the best results from your medicine. Do not use  the inhaler more than your doctor tells you to. 14. Put the cap back on the inhaler. 15. Follow the directions from your doctor or from the inhaler package about cleaning the inhaler. If you use more than one inhaler, ask your doctor which inhalers to use and what order to use them in. Ask your doctor to help you figure out when you will need to refill your inhaler.  If you use a steroid inhaler, always rinse your mouth with water after your last puff, gargle and spit out the water. Do not swallow the water. GET HELP IF:  The inhaler medicine only partially helps to stop wheezing or shortness of breath.  You are having trouble using your inhaler.  You  have some increase in thick spit (phlegm). GET HELP RIGHT AWAY IF:  The inhaler medicine does not help your wheezing or shortness of breath or you have tightness in your chest.  You have dizziness, headaches, or fast heart rate.  You have chills, fever, or night sweats.  You have a large increase of thick spit, or your thick spit is bloody. MAKE SURE YOU:   Understand these instructions.  Will watch your condition.  Will get help right away if you are not doing well or get worse. Document Released: 05/31/2008 Document Revised: 06/12/2013 Document Reviewed: 03/21/2013 [facility] Patient Information 2014 [place], [place].  Managing Your High Blood Pressure Blood pressure is a measurement of how forceful your blood is pressing against the walls of the arteries. Arteries are muscular tubes within the circulatory system. Blood pressure does not stay the same. Blood pressure rises when you are active, excited, or nervous; and it lowers during sleep and relaxation. If the numbers measuring your blood pressure stay above normal most of the time, you are at risk for health problems. High blood pressure (hypertension) is a long-term (chronic) condition in which blood pressure is elevated. A blood pressure reading is recorded as two numbers, such as  120 over 80 (or 120/80). The first, higher number is called the systolic pressure. It is a measure of the pressure in your arteries as the heart beats. The second, lower number is called the diastolic pressure. It is a measure of the pressure in your arteries as the heart relaxes between beats.  Keeping your blood pressure in a normal range is important to your overall health and prevention of health problems, such as heart disease and stroke. When your blood pressure is uncontrolled, your heart has to work harder than normal. High blood pressure is a very common condition in adults because blood pressure tends to rise with age. Men and women are equally likely to have hypertension but at different times in life. Before age 31, men are more likely to have hypertension. After 66 years of age, women are more likely to have it. Hypertension is especially common in African Americans. This condition often has no signs or symptoms. The cause of the condition is usually not known. Your caregiver can help you come up with a plan to keep your blood pressure in a normal, healthy range. BLOOD PRESSURE STAGES Blood pressure is classified into four stages: normal, prehypertension, stage 1, and stage 2. Your blood pressure reading will be used to determine what type of treatment, if any, is necessary. Appropriate treatment options are tied to these four stages:  Normal  Systolic pressure (mm Hg): below 120.  Diastolic pressure (mm Hg): below 80. Prehypertension  Systolic pressure (mm Hg): 120 to 139.  Diastolic pressure (mm Hg): 80 to 89. Stage1  Systolic pressure (mm Hg): 140 to 159.  Diastolic pressure (mm Hg): 90 to 99. Stage2  Systolic pressure (mm Hg): 160 or above.  Diastolic pressure (mm Hg): 100 or above. RISKS RELATED TO HIGH BLOOD PRESSURE Managing your blood pressure is an important responsibility. Uncontrolled high blood pressure can lead to:  A heart attack.  A stroke.  A weakened  blood vessel (aneurysm).  Heart failure.  Kidney damage.  Eye damage.  Metabolic syndrome.  Memory and concentration problems. HOW TO MANAGE YOUR BLOOD PRESSURE Blood pressure can be managed effectively with lifestyle changes and medicines (if needed). Your caregiver will help you come up with a plan to bring your blood pressure within  a normal range. Your plan should include the following: Education  Read all information provided by your caregivers about how to control blood pressure.  Educate yourself on the latest guidelines and treatment recommendations. New research is always being done to further define the risks and treatments for high blood pressure. Lifestylechanges  Control your weight.  Avoid smoking.  Stay physically active.  Reduce the amount of salt in your diet.  Reduce stress.  Control any chronic conditions, such as high cholesterol or diabetes.  Reduce your alcohol intake. Medicines  Several medicines (antihypertensive medicines) are available, if needed, to bring blood pressure within a normal range. Communication  Review all the medicines you take with your caregiver because there may be side effects or interactions.  Talk with your caregiver about your diet, exercise habits, and other lifestyle factors that may be contributing to high blood pressure.  See your caregiver regularly. Your caregiver can help you create and adjust your plan for managing high blood pressure. RECOMMENDATIONS FOR TREATMENT AND FOLLOW-UP  The following recommendations are based on current guidelines for managing high blood pressure in nonpregnant adults. Use these recommendations to identify the proper follow-up period or treatment option based on your blood pressure reading. You can discuss these options with your caregiver.  Systolic pressure of 120 to 139 or diastolic pressure of 80 to 89: Follow up with your caregiver as directed.  Systolic pressure of 140 to 160 or  diastolic pressure of 90 to 100: Follow up with your caregiver within 2 months.  Systolic pressure above 160 or diastolic pressure above 100: Follow up with your caregiver within 1 month.  Systolic pressure above 180 or diastolic pressure above 110: Consider antihypertensive therapy; follow up with your caregiver within 1 week.  Systolic pressure above 200 or diastolic pressure above 120: Begin antihypertensive therapy; follow up with your caregiver within 1 week. Document Released: 05/16/2012 Document Reviewed: 05/16/2012 [facility] Patient Information 2014 [place].  Near-Syncope Near-syncope (commonly known as near fainting) is sudden weakness, dizziness, or feeling like you might pass out. This can happen when getting up or while standing for a long time. It is caused by a sudden decrease in blood flow to the brain, which can occur for various reasons. Most of the reasons are not serious.  HOME CARE Watch your condition for any changes.  Have someone stay with you until you feel stable.  If you feel like you are going to pass out:  Lie down right away.  Breathe deeply and steadily.  Move only when the feeling has gone away. Most of the time, this feeling lasts only a few minutes. You may feel tired for several hours.  Drink enough fluids to keep your pee (urine) clear or pale yellow.  If you are taking blood pressure or heart medicine, stand up slowly.  Follow up with your doctor as told. GET HELP RIGHT AWAY IF:   You have a severe headache.  You have unusual pain in the chest, belly (abdomen), or back.  You have bleeding from the mouth or butt (rectum), or you have black or tarry poop (stool).  You feel your heart beat differently than normal, or you have a very fast pulse.  You pass out, or you twitch and shake when you pass out.  You pass out when sitting or lying down.  You feel confused.  You have trouble walking.  You are weak.  You have vision  problems. MAKE SURE YOU:   Understand these instructions.  Will watch your condition.  Will get help right away if you are not doing well or get worse. Document Released: 02/08/2008 Document Revised: 04/24/2013 Document Reviewed: 01/25/2013 [facility] Patient Information 2014 [place], [place].  Nicotine Addiction Nicotine can act as both a stimulant (excites/activates) and a sedative (calms/quiets). Immediately after exposure to nicotine, there is a "kick" caused in part by the drug's stimulation of the adrenal glands and resulting discharge of adrenaline (epinephrine). The rush of adrenaline stimulates the body and causes a sudden release of sugar. This means that smokers are always slightly hyperglycemic. Hyperglycemic means that the blood sugar is high, just like in diabetics. Nicotine also decreases the amount of insulin which helps control sugar levels in the body. There is an increase in blood pressure, breathing, and the rate of heart beats.  In addition, nicotine indirectly causes a release of dopamine in the brain that controls pleasure and motivation. A similar reaction is seen with other drugs of abuse, such as cocaine and heroin. This dopamine release is thought to cause the pleasurable sensations when smoking. In some different cases, nicotine can also create a calming effect, depending on sensitivity of the smoker's nervous system and the dose of nicotine taken. WHAT HAPPENS WHEN NICOTINE IS TAKEN FOR LONG PERIODS OF TIME?  Long-term use of nicotine results in addiction. It is difficult to stop.  Repeated use of nicotine creates tolerance. Higher doses of nicotine are needed to get the "kick." When nicotine use is stopped, withdrawal may last a month or more. Withdrawal may begin within a few hours after the last cigarette. Symptoms peak within the first few days and may lessen within a few weeks. For some people, however, symptoms may last for months or longer. Withdrawal symptoms  include:   Irritability.  Craving.  Learning and attention deficits.  Sleep disturbances.  Increased appetite. Craving for tobacco may last for 6 months or longer. Many behaviors done while using nicotine can also play a part in the severity of withdrawal symptoms. For some people, the feel, smell, and sight of a cigarette and the ritual of obtaining, handling, lighting, and smoking the cigarette are closely linked with the pleasure of smoking. When stopped, they also miss the related behaviors which make the withdrawal or craving worse. While nicotine gum and patches may lessen the drug aspects of withdrawal, cravings often persist. WHAT ARE THE MEDICAL CONSEQUENCES OF NICOTINE USE?  Nicotine addiction accounts for one-third of all cancers. The top cancer caused by tobacco is lung cancer. Lung cancer is the number one cancer killer of both men and women.  Smoking is also associated with cancers of the:  Mouth.  Pharynx.  Larynx.  Esophagus.  Stomach.  Pancreas.  Cervix.  Kidney.  Ureter.  Bladder.  Smoking also causes lung diseases such as lasting (chronic) bronchitis and emphysema.  It worsens asthma in adults and children.  Smoking increases the risk of heart disease, including:  Stroke.  Heart attack.  Vascular disease.  Aneurysm.  Passive or secondary smoke can also increase medical risks including:  Asthma in children.  Sudden Infant Death Syndrome (SIDS).  Additionally, dropped cigarettes are the leading cause of residential fire fatalities.  Nicotine poisoning has been reported from accidental ingestion of tobacco products by children and pets. Death usually results in a few minutes from respiratory failure (when a person stops breathing) caused by paralysis. TREATMENT   Medication. Nicotine replacement medicines such as nicotine gum and the patch are used to stop smoking. These medicines  gradually lower the dosage of nicotine in the body. These  medicines do not contain the carbon monoxide and other toxins found in tobacco smoke.  Hypnotherapy.  Relaxation therapy.  Nicotine Anonymous (a 12-step support program). Find times and locations in your local yellow pages. Document Released: 04/27/2004 Document Revised: 11/14/2011 Document Reviewed: 09/19/2007 [facility] Patient Information 2014 [place], [place].  Smoking Cessation, Tips for Success If you are ready to quit smoking, congratulations! You have chosen to help yourself be healthier. Cigarettes bring nicotine, tar, carbon monoxide, and other irritants into your body. Your lungs, heart, and blood vessels will be able to work better without these poisons. There are many different ways to quit smoking. Nicotine gum, nicotine patches, a nicotine inhaler, or nicotine nasal spray can help with physical craving. Hypnosis, support groups, and medicines help break the habit of smoking. WHAT THINGS CAN I DO TO MAKE QUITTING EASIER?  Here are some tips to help you quit for good:  Pick a date when you will quit smoking completely. Tell all of your friends and family about your plan to quit on that date.  Do not try to slowly cut down on the number of cigarettes you are smoking. Pick a quit date and quit smoking completely starting on that day.  Throw away all cigarettes.   Clean and remove all ashtrays from your home, work, and car.   On a card, write down your reasons for quitting. Carry the card with you and read it when you get the urge to smoke.   Cleanse your body of nicotine. Drink enough water and fluids to keep your urine clear or pale yellow. Do this after quitting to flush the nicotine from your body.   Learn to predict your moods. Do not let a bad situation be your excuse to have a cigarette. Some situations in your life might tempt you into wanting a cigarette.   Never have "just one" cigarette. It leads to wanting another and another. Remind yourself of your decision to  quit.   Change habits associated with smoking. If you smoked while driving or when feeling stressed, try other activities to replace smoking. Stand up when drinking your coffee. Brush your teeth after eating. Sit in a different chair when you read the paper. Avoid alcohol while trying to quit, and try to drink fewer caffeinated beverages. Alcohol and caffeine may urge you to smoke.   Avoid foods and drinks that can trigger a desire to smoke, such as sugary or spicy foods and alcohol.   Ask people who smoke not to smoke around you.   Have something planned to do right after eating or having a cup of coffee. For example, plan to take a walk or exercise.   Try a relaxation exercise to calm you down and decrease your stress. Remember, you may be tense and nervous for the first 2 weeks after you quit, but this will pass.   Find new activities to keep your hands busy. Play with a pen, coin, or rubber band. Doodle or draw things on paper.   Brush your teeth right after eating. This will help cut down on the craving for the taste of tobacco after meals. You can also try mouthwash.   Use oral substitutes in place of cigarettes. Try using lemon drops, carrots, cinnamon sticks, or chewing gum. Keep them handy so they are available when you have the urge to smoke.   When you have the urge to smoke, try deep breathing.  Designate your home as a nonsmoking area.   If you are a heavy smoker, ask your health care provider about a prescription for nicotine chewing gum. It can ease your withdrawal from nicotine.   Reward yourself. Set aside the cigarette money you save and buy yourself something nice.   Look for support from others. Join a support group or smoking cessation program. Ask someone at home or at work to help you with your plan to quit smoking.   Always ask yourself, "Do I need this cigarette or is this just a reflex?" Tell yourself, "Today, I choose not to smoke," or "I do not  want to smoke." You are reminding yourself of your decision to quit.  Do not replace cigarette smoking with electronic cigarettes (commonly called e-cigarettes). The safety of e-cigarettes is unknown, and some may contain harmful chemicals.  If you relapse, do not give up! Plan ahead and think about what you will do the next time you get the urge to smoke.  HOW WILL I FEEL WHEN I QUIT SMOKING? You may have symptoms of withdrawal because your body is used to nicotine (the addictive substance in cigarettes). You may crave cigarettes, be irritable, feel very hungry, cough often, get headaches, or have difficulty concentrating. The withdrawal symptoms are only temporary. They are strongest when you first quit but will go away within 10 14 days. When withdrawal symptoms occur, stay in control. Think about your reasons for quitting. Remind yourself that these are signs that your body is healing and getting used to being without cigarettes. Remember that withdrawal symptoms are easier to treat than the major diseases that smoking can cause.  Even after the withdrawal is over, expect periodic urges to smoke. However, these cravings are generally short lived and will go away whether you smoke or not. Do not smoke!  WHAT RESOURCES ARE AVAILABLE TO HELP ME QUIT SMOKING? Your health care provider can direct you to community resources or hospitals for support, which may include:  Group support.  Education.  Hypnosis.  Therapy. Document Released: 05/20/2004 Document Revised: 06/12/2013 Document Reviewed: 02/07/2013 [facility] Patient Information 2014 [place], [place].  Smoking Hazards Smoking cigarettes is extremely bad for your health. Tobacco smoke has over 200 known poisons in it. It contains the poisonous gases nitrogen oxide and carbon monoxide. There are over 60 chemicals in tobacco smoke that cause cancer. Some of the chemicals found in cigarette smoke include:   Cyanide.   Benzene.    Formaldehyde.   Methanol (wood alcohol).   Acetylene (fuel used in welding torches).   Ammonia.  Even smoking lightly shortens your life expectancy by several years. You can greatly reduce the risk of medical problems for you and your family by stopping now. Smoking is the most preventable cause of death and disease in our society. Within days of quitting smoking, your circulation improves, you decrease the risk of having a heart attack, and your lung capacity improves. There may be some increased phlegm in the first few days after quitting, and it may take months for your lungs to clear up completely. Quitting for 10 years reduces your risk of developing lung cancer to almost that of a nonsmoker.  WHAT ARE THE RISKS OF SMOKING? Cigarette smokers have an increased risk of many serious medical problems, including:  Lung cancer.   Lung disease (such as pneumonia, bronchitis, and emphysema).   Heart attack and chest pain due to the heart not getting enough oxygen (angina).   Heart disease and  peripheral blood vessel disease.   Hypertension.   Stroke.   Oral cancer (cancer of the lip, mouth, or voice box).   Bladder cancer.   Pancreatic cancer.   Cervical cancer.   Pregnancy complications, including premature birth.   Stillbirths and smaller newborn babies, birth defects, and genetic damage to sperm.   Early menopause.   Lower estrogen level for women.   Infertility.   Facial wrinkles.   Blindness.   Increased risk of broken bones (fractures).   Senile dementia.   Stomach ulcers and internal bleeding.   Delayed wound healing and increased risk of complications during surgery. Because of secondhand smoke exposure, children of smokers have an increased risk of the following:   Sudden infant death syndrome (SIDS).   Respiratory infections.   Lung cancer.   Heart disease.   Ear infections.  WHY IS SMOKING ADDICTIVE? Nicotine is  the chemical agent in tobacco that is capable of causing addiction or dependence. When you smoke and inhale, nicotine is absorbed rapidly into the bloodstream through your lungs. Both inhaled and noninhaled nicotine may be addictive.  WHAT ARE THE BENEFITS OF QUITTING?  There are many health benefits to quitting smoking. Some are:   The likelihood of developing cancer and heart disease decreases. Health improvements are seen almost immediately.   Blood pressure, pulse rate, and breathing patterns start returning to normal soon after quitting.   People who quit may see an improvement in their overall quality of life.  HOW DO YOU QUIT SMOKING? Smoking is an addiction with both physical and psychological effects, and longtime habits can be hard to change. Your health care provider can recommend:  Programs and community resources, which may include group support, education, or therapy.  Replacement products, such as patches, gum, and nasal sprays. Use these products only as directed. Do not replace cigarette smoking with electronic cigarettes (commonly called e-cigarettes). The safety of e-cigarettes is unknown, and some may contain harmful chemicals. FOR MORE INFORMATION  American Lung Association: www.lung.org  American Cancer Society: www.cancer.org Document Released: 09/29/2004 Document Revised: 06/12/2013 Document Reviewed: 02/11/2013 [facility] Patient Information 2014 [place], [place].  You Can Quit Smoking If you are ready to quit smoking or are thinking about it, congratulations! You have chosen to help yourself be healthier and live longer! There are lots of different ways to quit smoking. Nicotine gum, nicotine patches, a nicotine inhaler, or nicotine nasal spray can help with physical craving. Hypnosis, support groups, and medicines help break the habit of smoking. TIPS TO GET OFF AND STAY OFF CIGARETTES  Learn to predict your moods. Do not let a bad situation be your excuse to  have a cigarette. Some situations in your life might tempt you to have a cigarette.  Ask friends and co-workers not to smoke around you.  Make your home smoke-free.  Never have "just one" cigarette. It leads to wanting another and another. Remind yourself of your decision to quit.  On a card, make a list of your reasons for not smoking. Read it at least the same number of times a day as you have a cigarette. Tell yourself everyday, "I do not want to smoke. I choose not to smoke."  Ask someone at home or work to help you with your plan to quit smoking.  Have something planned after you eat or have a cup of coffee. Take a walk or get other exercise to perk you up. This will help to keep you from overeating.  Try a relaxation  exercise to calm you down and decrease your stress. Remember, you may be tense and nervous the first two weeks after you quit. This will pass.  Find new activities to keep your hands busy. Play with a pen, coin, or rubber band. Doodle or draw things on paper.  Brush your teeth right after eating. This will help cut down the craving for the taste of tobacco after meals. You can try mouthwash too.  Try gum, breath mints, or diet candy to keep something in your mouth. IF YOU SMOKE AND WANT TO QUIT:  Do not stock up on cigarettes. Never buy a carton. Wait until one pack is finished before you buy another.  Never carry cigarettes with you at work or at home.  Keep cigarettes as far away from you as possible. Leave them with someone else.  Never carry matches or a lighter with you.  Ask yourself, "Do I need this cigarette or is this just a reflex?"  Bet with someone that you can quit. Put cigarette money in a piggy bank every morning. If you smoke, you give up the money. If you do not smoke, by the end of the week, you keep the money.  Keep trying. It takes 21 days to change a habit!  Talk to your doctor about using medicines to help you quit. These include nicotine  replacement gum, lozenges, or skin patches. Document Released: 06/18/2009 Document Revised: 11/14/2011 Document Reviewed: 06/18/2009 [facility] Patient Information 2014 [place], [place].  Smoking Cessation, Tips for Success If you are ready to quit smoking, congratulations! You have chosen to help yourself be healthier. Cigarettes bring nicotine, tar, carbon monoxide, and other irritants into your body. Your lungs, heart, and blood vessels will be able to work better without these poisons. There are many different ways to quit smoking. Nicotine gum, nicotine patches, a nicotine inhaler, or nicotine nasal spray can help with physical craving. Hypnosis, support groups, and medicines help break the habit of smoking. WHAT THINGS CAN I DO TO MAKE QUITTING EASIER?  Here are some tips to help you quit for good:  Pick a date when you will quit smoking completely. Tell all of your friends and family about your plan to quit on that date.  Do not try to slowly cut down on the number of cigarettes you are smoking. Pick a quit date and quit smoking completely starting on that day.  Throw away all cigarettes.   Clean and remove all ashtrays from your home, work, and car.   On a card, write down your reasons for quitting. Carry the card with you and read it when you get the urge to smoke.   Cleanse your body of nicotine. Drink enough water and fluids to keep your urine clear or pale yellow. Do this after quitting to flush the nicotine from your body.   Learn to predict your moods. Do not let a bad situation be your excuse to have a cigarette. Some situations in your life might tempt you into wanting a cigarette.   Never have "just one" cigarette. It leads to wanting another and another. Remind yourself of your decision to quit.   Change habits associated with smoking. If you smoked while driving or when feeling stressed, try other activities to replace smoking. Stand up when drinking your coffee. Brush  your teeth after eating. Sit in a different chair when you read the paper. Avoid alcohol while trying to quit, and try to drink fewer caffeinated beverages. Alcohol and caffeine may  urge you to smoke.   Avoid foods and drinks that can trigger a desire to smoke, such as sugary or spicy foods and alcohol.   Ask people who smoke not to smoke around you.   Have something planned to do right after eating or having a cup of coffee. For example, plan to take a walk or exercise.   Try a relaxation exercise to calm you down and decrease your stress. Remember, you may be tense and nervous for the first 2 weeks after you quit, but this will pass.   Find new activities to keep your hands busy. Play with a pen, coin, or rubber band. Doodle or draw things on paper.   Brush your teeth right after eating. This will help cut down on the craving for the taste of tobacco after meals. You can also try mouthwash.   Use oral substitutes in place of cigarettes. Try using lemon drops, carrots, cinnamon sticks, or chewing gum. Keep them handy so they are available when you have the urge to smoke.   When you have the urge to smoke, try deep breathing.   Designate your home as a nonsmoking area.   If you are a heavy smoker, ask your health care provider about a prescription for nicotine chewing gum. It can ease your withdrawal from nicotine.   Reward yourself. Set aside the cigarette money you save and buy yourself something nice.   Look for support from others. Join a support group or smoking cessation program. Ask someone at home or at work to help you with your plan to quit smoking.   Always ask yourself, "Do I need this cigarette or is this just a reflex?" Tell yourself, "Today, I choose not to smoke," or "I do not want to smoke." You are reminding yourself of your decision to quit.  Do not replace cigarette smoking with electronic cigarettes (commonly called e-cigarettes). The safety of  e-cigarettes is unknown, and some may contain harmful chemicals.  If you relapse, do not give up! Plan ahead and think about what you will do the next time you get the urge to smoke.  HOW WILL I FEEL WHEN I QUIT SMOKING? You may have symptoms of withdrawal because your body is used to nicotine (the addictive substance in cigarettes). You may crave cigarettes, be irritable, feel very hungry, cough often, get headaches, or have difficulty concentrating. The withdrawal symptoms are only temporary. They are strongest when you first quit but will go away within 10 14 days. When withdrawal symptoms occur, stay in control. Think about your reasons for quitting. Remind yourself that these are signs that your body is healing and getting used to being without cigarettes. Remember that withdrawal symptoms are easier to treat than the major diseases that smoking can cause.  Even after the withdrawal is over, expect periodic urges to smoke. However, these cravings are generally short lived and will go away whether you smoke or not. Do not smoke!  WHAT RESOURCES ARE AVAILABLE TO HELP ME QUIT SMOKING? Your health care provider can direct you to community resources or hospitals for support, which may include:  Group support.  Education.  Hypnosis.  Therapy. Document Released: 05/20/2004 Document Revised: 06/12/2013 Document Reviewed: 02/07/2013 [facility] Patient Information 2014 [place], [place].  Diabetes, Type 2, Am I At Risk? Diabetes is a lasting (chronic) disease. In type 2 diabetes, the pancreas does not make enough insulin, and the body does not respond normally to the insulin that is made. This type of  diabetes was also previously called adult onset diabetes. About 90% of all those who have diabetes have type 2. It usually occurs after the age of 33, but can occur at any age.  People develop type 2 diabetes because they do not use insulin properly. Eventually, the pancreas cannot make enough insulin for  the body's needs. Over time, the amount of glucose (sugar) in the blood increases. RISK FACTORS  Overweight  the more weight you have, the more resistant your cells become to insulin.  Family history  you are more likely to get diabetes if a parent or sibling has diabetes.  Race certain races get diabetes more.  African Americans.  American Indians.  Asian Americans.  Hispanics.  Pacific Islander.  Inactive exercise helps control weight and helps your cells be more sensitive to insulin.  Gestational diabetes  some women develop diabetes while they are pregnant. This goes away when they deliver. However, they are 50-60% more likely to develop type 2 diabetes at a later time.  Having a baby over 9 pounds  a sign that you may have had gestational diabetes.  Age the risk of diabetes goes up as you get older, especially after age 24.  High blood pressure (hypertension). SYMPTOMS Many people have no signs or symptoms. Symptoms can be so mild that you might not even notice them. Some of these signs are:  Increased thirst.  Increased hunger.  Tiredness (fatigue).  Increased urination, especially at night.  Weight loss.  Blurred vision.  Sores that do not heal. WHO SHOULD BE TESTED?  Anyone 45 years or older, especially if overweight, should consider getting tested.  If you are younger than 45, overweight, and have one or more of the risk factors, you should consider getting tested. DIAGNOSIS  Fasting blood glucose (FBS). Usually, 2 are done.  FBS 101-125 mg/dl is considered pre-diabetes.  FBS 126 mg/dl or greater is considered diabetes.  2 hour Oral Glucose Tolerance Test (OGTT). This test is preformed by first having you not eat or drink for several hours. You are then given something sweet to drink and your blood glucose is measured fasting, at one hour and 2 hours. This test tells how well you are able to handle sugars or carbohydrates.  Fasting: 60-100  mg/dl.  1 hour: less than 200 mg/dl.  2 hours: less than 140 mg/dl.  A1c A1c is a blood glucose test that gives and average of your blood glucose over 3 months. It is the accepted method to use to diagnose diabetes.  A1c 5.7-6.4% is considered pre-diabetes.  A1c 6.5% or greater is considered diabetes. WHAT DOES IT MEAN TO HAVE PRE-DIABETES? Pre-diabetes means you are at risk for getting type 2 diabetes. Your blood glucose is higher than normal, but not yet high enough to diagnose diabetes. The good news is, if you have pre-diabetes you can reduce the risk of getting diabetes and even return to normal blood glucose levels. With modest weight loss and moderate physical activity, you can delay or prevent type 2 diabetes.  PREVENTION You cannot do anything about race, age or family history, but you can lower your chances of getting diabetes. You can:   Exercise regularly and be active.  Reduce fat and calorie intake.  Make wise food choices as much as you can.  Reduce your intake of salt and alcohol.  Maintain a reasonable weight.  Keep blood pressure in an acceptable range. Take medication if needed.  Not smoke.  Maintain an acceptable cholesterol  level (HDL, LDL, Triglycerides). Take medication if needed. DOING MY PART: GETTING STARTED Making big changes in your life is hard, especially if you are faced with more than one change. You can make it easier by taking these steps:  Make a plan to change behavior.  Decide exactly what you will do and when you will do it.  Plan what you need to get ready.  Think about what might prevent you from reaching your goals.  Find family and friends who will support and encourage you.  Decide how you will reward yourself when you do what you have planned.  Your doctor, dietitian, or counselor can help you make a plan. HERE ARE SOME OF THE AREAS YOU MAY WISH TO CHANGE TO REDUCE YOUR RISK OF DIABETES. If you are overweight or obese, choose  sensible ways to get in shape. Even small amounts of weight loss, like 5-10 pounds, can help reduce the effects of insulin resistance and help blood glucose control. Diet  Avoid crash diets. Instead, eat less of the foods you usually have. Limit the amount of fat you eat.  Increase your physical activity. Aim for at least 30 minutes of exercise most days of the week.  Set a reasonable weight-loss goal, such as losing 1 pound a week. Aim for a long-term goal of losing 5-7% of your total body weight.  Make wise food choices most of the time.  What you eat has a big impact on your health. By making wise food choices, you can help control your body weight, blood pressure, and cholesterol.  Take a hard look at the serving sizes of the foods you eat. Reduce serving sizes of meat, desserts, and foods high in fat. Increase your intake of fruits and vegetables.  Limit your fat intake to about 25% of your total calories. For example, if your food choices add up to about 2,000 calories a day, try to eat no more than 56 grams of fat. Your caregiver or a dietitian can help you figure out how much fat to have. You can check food labels for fat content too.  You may also want to reduce the number of calories you have each day.  Keep a food log. Write down what you eat, how much you eat, and anything else that helps keep you on track.  When you meet your goal, reward yourself with a nonfood item or activity. Exercise  Be physically active every day.  Keep and exercise log. Write down what exercise you did, for how long, and anything else that keeps you on track.  Regular exercise (like brisk walking) tackles several risk factors at once. It helps you lose weight, it keeps your cholesterol and blood pressure under control, and it helps your body use insulin. People who are physically active for 30 minutes a day, 5 days a week, reduced their risk of type 2 diabetes. If you are not very active, you should  start slowly at first. Talk with your caregiver first about what kinds of exercise would be safe for you. Make a plan to increase your activity level with the goal of being active for at least 30 minutes a day, most days of the week.  Choose activities you enjoy. Here are some ways to work extra activity into your daily routine:  Take the stairs rather than an elevator or escalator.  Park at the far end of the lot and walk.  Get off the bus a few stops early and walk  the rest of the way.  Walk or bicycle instead of drive whenever you can. Medications Some people need medication to help control their blood pressure or cholesterol levels. If you do, take your medicines as directed. Ask your caregiver whether there are any medicines you can take to prevent type 2 diabetes. Document Released: 08/25/2003 Document Revised: 11/14/2011 Document Reviewed: 05/20/2009 [facility] Patient Information 2014 [place], [place].

## 2013-10-02 ENCOUNTER — Ambulatory Visit: Payer: Medicare HMO | Attending: Family Medicine

## 2013-10-02 DIAGNOSIS — Z8673 Personal history of transient ischemic attack (TIA), and cerebral infarction without residual deficits: Secondary | ICD-10-CM | POA: Insufficient documentation

## 2013-10-02 DIAGNOSIS — IMO0001 Reserved for inherently not codable concepts without codable children: Secondary | ICD-10-CM | POA: Insufficient documentation

## 2013-10-02 DIAGNOSIS — R488 Other symbolic dysfunctions: Secondary | ICD-10-CM | POA: Insufficient documentation

## 2013-10-23 ENCOUNTER — Ambulatory Visit: Payer: Medicare HMO | Admitting: Rehabilitative and Restorative Service Providers"

## 2013-10-23 ENCOUNTER — Ambulatory Visit: Payer: Medicare HMO | Attending: Family Medicine

## 2013-10-23 DIAGNOSIS — Z8673 Personal history of transient ischemic attack (TIA), and cerebral infarction without residual deficits: Secondary | ICD-10-CM | POA: Insufficient documentation

## 2013-10-23 DIAGNOSIS — IMO0001 Reserved for inherently not codable concepts without codable children: Secondary | ICD-10-CM | POA: Insufficient documentation

## 2013-10-23 DIAGNOSIS — R488 Other symbolic dysfunctions: Secondary | ICD-10-CM | POA: Insufficient documentation

## 2013-10-25 ENCOUNTER — Ambulatory Visit (INDEPENDENT_AMBULATORY_CARE_PROVIDER_SITE_OTHER): Payer: Medicare HMO | Admitting: Neurology

## 2013-10-25 ENCOUNTER — Encounter: Payer: Self-pay | Admitting: Neurology

## 2013-10-25 VITALS — BP 108/76 | HR 80 | Temp 98.2°F | Resp 16 | Ht 61.0 in | Wt 123.8 lb

## 2013-10-25 DIAGNOSIS — R569 Unspecified convulsions: Secondary | ICD-10-CM

## 2013-10-25 DIAGNOSIS — I639 Cerebral infarction, unspecified: Secondary | ICD-10-CM

## 2013-10-25 DIAGNOSIS — I6350 Cerebral infarction due to unspecified occlusion or stenosis of unspecified cerebral artery: Secondary | ICD-10-CM

## 2013-10-25 NOTE — Progress Notes (Signed)
NEUROLOGY CONSULTATION NOTE  Katie Waller MRN: [id number] DOB: 11-Jul-1946  Referring provider: Standley Dakins, MD Primary care provider: Elias Else, MD  Reason for consult:  TIA  HISTORY OF PRESENT ILLNESS: Katie Waller is a 68 year old left-handed woman with history of COPD, smoker, prediabetes, hypertension, tachycardia who presents for hospital follow-up regarding TIA.  Records and images were personally reviewed where available.  She is accompanied by her daughter  She was admitted to [facility] on 09/16/13 for stroke-like event.    She was taking azithromycin, Mucinex and antihistamines to treat upper respiratory symptoms.  She started feeling fatigued, dizzy and generalized weakness.  She sat down in the chair and was unable to get herself up.  She developed slurred speech and left upper and lower extremity weakness.  She called out for help.  She did not have a facial droop.    Cardiac workup, including troponins and EKG, were unremarkable.  She had a CT of the head which revealed nonspecific white matter changes but no acute infarct.  MRI of the brain also did not reveal an acute infarct or bleed.  MRI of the brain revealed small vessel ischemic changes but no acute infarct.  MRA of the head revealed mild to moderate right PCA P1 segment stenosis but otherwise unremarkable.  Carotid doppler revealed 1-39% bilateral ICA stenosis but nothing hemodynamically significant.  2D Echo revealed LVEF 65-70% but no cardiac source of emboli.  LDL was 66.  Hgb A1c was 6.1 with mean plasma glucose of 128.  During her hospitalization, she was exhibiting episodes of shaking associated with what she called "shaking in my head".  She was conscious during these spells.  Several spells were captured on EEG, which was normal and did not reveal electrographic correlate.  Since discharge, she has undergone PT and currently is involved in speech therapy.  She is feeling much better.  She was discharged on  ASA 81mg  and blood pressure medication.  She started working on smoking cessation.  Her daughter feels that she had been having increased stress and anxiety as work tends to be more hectic at the end of the year. Marland Kitchen  PAST MEDICAL HISTORY: Past Medical History  Diagnosis Date  . Heart disease   . Hypertension   . Diabetes   . Asthma     PAST SURGICAL HISTORY: Past Surgical History  Procedure Laterality Date  . Knee surgery Left     MEDICATIONS: Current Outpatient Prescriptions on File Prior to Visit  Medication Sig Dispense Refill  . albuterol (PROVENTIL HFA;VENTOLIN HFA) 108 (90 BASE) MCG/ACT inhaler Inhale 2 puffs into the lungs every 6 (six) hours as needed for wheezing or shortness of breath.  1 Inhaler  0  . aspirin 81 MG chewable tablet Chew 1 tablet (81 mg total) by mouth daily.      Marland Kitchen lisinopril-hydrochlorothiazide (ZESTORETIC) 10-12.5 MG per tablet Take 1 tablet by mouth daily.  30 tablet  0   No current facility-administered medications on file prior to visit.    ALLERGIES: Allergies  Allergen Reactions  . Codeine Nausea And Vomiting    FAMILY HISTORY: Family History  Problem Relation Age of Onset  . Hyperlipidemia Mother   . Hypertension Mother   . Hypertension Father   . Hyperlipidemia Father     SOCIAL HISTORY: History   Social History  . Marital Status: Divorced    Spouse Name: N/A    Number of Children: N/A  . Years of Education: N/A  Occupational History  . Not on file.   Social History Main Topics  . Smoking status: Current Every Day Smoker    Types: Cigarettes  . Smokeless tobacco: Never Used     Comment: vapor  . Alcohol Use: No  . Drug Use: No  . Sexual Activity: No   Other Topics Concern  . Not on file   Social History Narrative  . No narrative on file    REVIEW OF SYSTEMS: Constitutional: No fevers, chills, or sweats, no generalized fatigue, change in appetite Eyes: No visual changes, double vision, eye pain Ear, nose and  throat: No hearing loss, ear pain, nasal congestion, sore throat Cardiovascular: No chest pain, palpitations Respiratory:  No shortness of breath at rest or with exertion, wheezes GastrointestinaI: No nausea, vomiting, diarrhea, abdominal pain, fecal incontinence Genitourinary:  No dysuria, urinary retention or frequency Musculoskeletal:  No neck pain, back pain Integumentary: No rash, pruritus, skin lesions Neurological: as above Psychiatric: No depression, insomnia, anxiety Endocrine: No palpitations, fatigue, diaphoresis, mood swings, change in appetite, change in weight, increased thirst Hematologic/Lymphatic:  No anemia, purpura, petechiae. Allergic/Immunologic: no itchy/runny eyes, nasal congestion, recent allergic reactions, rashes  PHYSICAL EXAM: Filed Vitals:   10/25/13 1414  BP: 108/76  Pulse: 80  Temp: 98.2 F (36.8 C)  Resp: 16   General: No acute distress Head:  Normocephalic/atraumatic Neck: supple, no paraspinal tenderness, full range of motion Back: No paraspinal tenderness Heart: regular rate and rhythm Lungs: Clear to auscultation bilaterally. Vascular: No carotid bruits. Neurological Exam: Mental status: alert and oriented to person, place, and time, speech fluent and not dysarthric, language intact. Cranial nerves: CN I: not tested CN II: pupils equal, round and reactive to light, visual fields intact, fundi unremarkable. CN III, IV, VI:  full range of motion, no nystagmus, no ptosis CN V: facial sensation intact CN VII: upper and lower face symmetric CN VIII: hearing intact CN IX, X: gag intact, uvula midline CN XI: sternocleidomastoid and trapezius muscles intact CN XII: tongue midline Bulk & Tone: normal, no fasciculations. Motor: 5/5 throughout Sensation: pinprick and vibration intact Deep Tendon Reflexes: 2+ throughout, toes down Finger to nose testing: normal Heel to shin: normal Gait: normal stance and stride.  Able to turn and walk in tandem.  Romberg negative.  IMPRESSION: 1.  Stroke like event.  Possible stroke not seen on MRI or may be psychogenic.  Given her stroke risk factors, would treat for secondary stroke prevention. 2.  Non-epileptic spells.  PLAN: Continue ASA 81mg  daily Blood pressure control Smoking cessation Mediterranean diet Regular exercise Follow up in 6 months  45 minutes spent with patient and daughter, over 50% spent counseling and coordinating care.  Thank you for allowing me to take part in the care of this patient.  Shon MilletAdam Kegan Mckeithan, DO  CC:  Elias Elseobert Reade, MD

## 2013-10-25 NOTE — Patient Instructions (Addendum)
1.  You did not have actual seizures.  Those episodes may have been related to anxiety. 2.  The stroke like episode may have been a stroke that was not seen on MRI, or it could have been anxiety too.  Since you have stroke risk factors, I would treat for secondary stroke prevention anyway.  Continue ASA 81mg  daily Blood pressure control Smoking cessation Mediterranean diet Regular exercise Follow up in 6 months or as needed.

## 2013-11-01 ENCOUNTER — Ambulatory Visit: Payer: Medicare HMO | Admitting: Rehabilitative and Restorative Service Providers"

## 2013-11-08 ENCOUNTER — Ambulatory Visit: Payer: Medicare HMO | Attending: Family Medicine | Admitting: Rehabilitative and Restorative Service Providers"

## 2013-11-08 ENCOUNTER — Ambulatory Visit: Payer: Medicare HMO

## 2013-11-08 DIAGNOSIS — IMO0001 Reserved for inherently not codable concepts without codable children: Secondary | ICD-10-CM | POA: Insufficient documentation

## 2013-11-08 DIAGNOSIS — Z8673 Personal history of transient ischemic attack (TIA), and cerebral infarction without residual deficits: Secondary | ICD-10-CM | POA: Insufficient documentation

## 2013-11-08 DIAGNOSIS — R488 Other symbolic dysfunctions: Secondary | ICD-10-CM | POA: Insufficient documentation

## 2013-11-15 ENCOUNTER — Ambulatory Visit: Payer: Medicare HMO | Admitting: Rehabilitative and Restorative Service Providers"

## 2013-11-22 ENCOUNTER — Ambulatory Visit: Payer: Medicare HMO | Admitting: Rehabilitative and Restorative Service Providers"

## 2013-12-05 ENCOUNTER — Other Ambulatory Visit (HOSPITAL_COMMUNITY): Payer: Self-pay | Admitting: Family Medicine

## 2013-12-05 DIAGNOSIS — Z1231 Encounter for screening mammogram for malignant neoplasm of breast: Secondary | ICD-10-CM

## 2013-12-11 ENCOUNTER — Ambulatory Visit (HOSPITAL_COMMUNITY)
Admission: RE | Admit: 2013-12-11 | Discharge: 2013-12-11 | Disposition: A | Discharge status: Home / Self Care | Payer: Medicare HMO | Source: Ambulatory Visit | Attending: Family Medicine | Admitting: Family Medicine

## 2013-12-11 DIAGNOSIS — Z1231 Encounter for screening mammogram for malignant neoplasm of breast: Secondary | ICD-10-CM

## 2014-10-13 DIAGNOSIS — J069 Acute upper respiratory infection, unspecified: Secondary | ICD-10-CM | POA: Diagnosis not present

## 2014-10-13 DIAGNOSIS — J209 Acute bronchitis, unspecified: Secondary | ICD-10-CM | POA: Diagnosis not present

## 2014-12-08 DIAGNOSIS — M810 Age-related osteoporosis without current pathological fracture: Secondary | ICD-10-CM | POA: Diagnosis not present

## 2014-12-08 DIAGNOSIS — Z0000 Encounter for general adult medical examination without abnormal findings: Secondary | ICD-10-CM | POA: Diagnosis not present

## 2014-12-08 DIAGNOSIS — I10 Essential (primary) hypertension: Secondary | ICD-10-CM | POA: Diagnosis not present

## 2014-12-08 DIAGNOSIS — Z1389 Encounter for screening for other disorder: Secondary | ICD-10-CM | POA: Diagnosis not present

## 2014-12-08 DIAGNOSIS — Z23 Encounter for immunization: Secondary | ICD-10-CM | POA: Diagnosis not present

## 2014-12-08 DIAGNOSIS — R05 Cough: Secondary | ICD-10-CM | POA: Diagnosis not present

## 2014-12-10 ENCOUNTER — Ambulatory Visit
Admission: RE | Admit: 2014-12-10 | Discharge: 2014-12-10 | Disposition: A | Discharge status: Home / Self Care | Payer: Commercial Managed Care - HMO | Source: Ambulatory Visit | Attending: Family Medicine | Admitting: Family Medicine

## 2014-12-10 ENCOUNTER — Other Ambulatory Visit: Payer: Self-pay | Admitting: Family Medicine

## 2014-12-10 DIAGNOSIS — R059 Cough, unspecified: Secondary | ICD-10-CM

## 2014-12-10 DIAGNOSIS — R05 Cough: Secondary | ICD-10-CM

## 2015-01-07 DIAGNOSIS — J449 Chronic obstructive pulmonary disease, unspecified: Secondary | ICD-10-CM | POA: Diagnosis not present

## 2015-01-07 DIAGNOSIS — R05 Cough: Secondary | ICD-10-CM | POA: Diagnosis not present

## 2015-04-13 DIAGNOSIS — R112 Nausea with vomiting, unspecified: Secondary | ICD-10-CM | POA: Diagnosis not present

## 2015-04-13 DIAGNOSIS — E860 Dehydration: Secondary | ICD-10-CM | POA: Diagnosis not present

## 2015-04-14 DIAGNOSIS — A084 Viral intestinal infection, unspecified: Secondary | ICD-10-CM | POA: Diagnosis not present

## 2015-04-14 DIAGNOSIS — I10 Essential (primary) hypertension: Secondary | ICD-10-CM | POA: Diagnosis not present

## 2015-06-09 ENCOUNTER — Other Ambulatory Visit: Payer: Self-pay | Admitting: Family Medicine

## 2015-06-09 DIAGNOSIS — R0989 Other specified symptoms and signs involving the circulatory and respiratory systems: Secondary | ICD-10-CM | POA: Diagnosis not present

## 2015-06-09 DIAGNOSIS — M810 Age-related osteoporosis without current pathological fracture: Secondary | ICD-10-CM | POA: Diagnosis not present

## 2015-06-09 DIAGNOSIS — I10 Essential (primary) hypertension: Secondary | ICD-10-CM | POA: Diagnosis not present

## 2015-06-09 DIAGNOSIS — F17200 Nicotine dependence, unspecified, uncomplicated: Secondary | ICD-10-CM | POA: Diagnosis not present

## 2015-06-09 DIAGNOSIS — Z23 Encounter for immunization: Secondary | ICD-10-CM | POA: Diagnosis not present

## 2015-06-09 DIAGNOSIS — J449 Chronic obstructive pulmonary disease, unspecified: Secondary | ICD-10-CM | POA: Diagnosis not present

## 2015-06-15 ENCOUNTER — Ambulatory Visit
Admission: RE | Admit: 2015-06-15 | Discharge: 2015-06-15 | Disposition: A | Discharge status: Home / Self Care | Payer: Commercial Managed Care - HMO | Source: Ambulatory Visit | Attending: Family Medicine | Admitting: Family Medicine

## 2015-06-15 DIAGNOSIS — I6523 Occlusion and stenosis of bilateral carotid arteries: Secondary | ICD-10-CM | POA: Diagnosis not present

## 2015-06-15 DIAGNOSIS — R0989 Other specified symptoms and signs involving the circulatory and respiratory systems: Secondary | ICD-10-CM

## 2015-12-21 DIAGNOSIS — F17200 Nicotine dependence, unspecified, uncomplicated: Secondary | ICD-10-CM | POA: Diagnosis not present

## 2015-12-21 DIAGNOSIS — Z1239 Encounter for other screening for malignant neoplasm of breast: Secondary | ICD-10-CM | POA: Diagnosis not present

## 2015-12-21 DIAGNOSIS — J449 Chronic obstructive pulmonary disease, unspecified: Secondary | ICD-10-CM | POA: Diagnosis not present

## 2015-12-21 DIAGNOSIS — Z0000 Encounter for general adult medical examination without abnormal findings: Secondary | ICD-10-CM | POA: Diagnosis not present

## 2015-12-21 DIAGNOSIS — Z1389 Encounter for screening for other disorder: Secondary | ICD-10-CM | POA: Diagnosis not present

## 2015-12-21 DIAGNOSIS — M810 Age-related osteoporosis without current pathological fracture: Secondary | ICD-10-CM | POA: Diagnosis not present

## 2015-12-21 DIAGNOSIS — I10 Essential (primary) hypertension: Secondary | ICD-10-CM | POA: Diagnosis not present

## 2015-12-22 ENCOUNTER — Other Ambulatory Visit: Payer: Self-pay | Admitting: Family Medicine

## 2015-12-22 DIAGNOSIS — Z1231 Encounter for screening mammogram for malignant neoplasm of breast: Secondary | ICD-10-CM

## 2016-01-05 ENCOUNTER — Ambulatory Visit
Admission: RE | Admit: 2016-01-05 | Discharge: 2016-01-05 | Disposition: A | Discharge status: Home / Self Care | Payer: Commercial Managed Care - HMO | Source: Ambulatory Visit | Attending: Family Medicine | Admitting: Family Medicine

## 2016-01-05 DIAGNOSIS — Z1231 Encounter for screening mammogram for malignant neoplasm of breast: Secondary | ICD-10-CM

## 2016-06-08 ENCOUNTER — Emergency Department (HOSPITAL_COMMUNITY): Payer: Commercial Managed Care - HMO

## 2016-06-08 ENCOUNTER — Encounter (HOSPITAL_COMMUNITY): Payer: Self-pay

## 2016-06-08 ENCOUNTER — Inpatient Hospital Stay (HOSPITAL_COMMUNITY)
Admission: EM | Admit: 2016-06-08 | Discharge: 2016-06-13 | DRG: 190 | Disposition: A | Discharge status: Home / Self Care | Payer: Commercial Managed Care - HMO | Attending: Internal Medicine | Admitting: Internal Medicine

## 2016-06-08 DIAGNOSIS — J441 Chronic obstructive pulmonary disease with (acute) exacerbation: Secondary | ICD-10-CM | POA: Diagnosis not present

## 2016-06-08 DIAGNOSIS — J449 Chronic obstructive pulmonary disease, unspecified: Secondary | ICD-10-CM | POA: Diagnosis present

## 2016-06-08 DIAGNOSIS — J9621 Acute and chronic respiratory failure with hypoxia: Secondary | ICD-10-CM | POA: Diagnosis present

## 2016-06-08 DIAGNOSIS — J440 Chronic obstructive pulmonary disease with acute lower respiratory infection: Secondary | ICD-10-CM | POA: Diagnosis not present

## 2016-06-08 DIAGNOSIS — I878 Other specified disorders of veins: Secondary | ICD-10-CM | POA: Diagnosis present

## 2016-06-08 DIAGNOSIS — Z7982 Long term (current) use of aspirin: Secondary | ICD-10-CM

## 2016-06-08 DIAGNOSIS — J9600 Acute respiratory failure, unspecified whether with hypoxia or hypercapnia: Secondary | ICD-10-CM | POA: Diagnosis present

## 2016-06-08 DIAGNOSIS — F17200 Nicotine dependence, unspecified, uncomplicated: Secondary | ICD-10-CM | POA: Diagnosis present

## 2016-06-08 DIAGNOSIS — J9601 Acute respiratory failure with hypoxia: Secondary | ICD-10-CM | POA: Diagnosis present

## 2016-06-08 DIAGNOSIS — J189 Pneumonia, unspecified organism: Secondary | ICD-10-CM | POA: Diagnosis present

## 2016-06-08 DIAGNOSIS — E119 Type 2 diabetes mellitus without complications: Secondary | ICD-10-CM | POA: Diagnosis not present

## 2016-06-08 DIAGNOSIS — R0602 Shortness of breath: Secondary | ICD-10-CM | POA: Diagnosis not present

## 2016-06-08 DIAGNOSIS — R0603 Acute respiratory distress: Secondary | ICD-10-CM

## 2016-06-08 DIAGNOSIS — R05 Cough: Secondary | ICD-10-CM | POA: Diagnosis not present

## 2016-06-08 DIAGNOSIS — F17210 Nicotine dependence, cigarettes, uncomplicated: Secondary | ICD-10-CM | POA: Diagnosis not present

## 2016-06-08 DIAGNOSIS — Z8249 Family history of ischemic heart disease and other diseases of the circulatory system: Secondary | ICD-10-CM | POA: Diagnosis not present

## 2016-06-08 DIAGNOSIS — I10 Essential (primary) hypertension: Secondary | ICD-10-CM | POA: Diagnosis present

## 2016-06-08 HISTORY — DX: Acute respiratory failure, unspecified whether with hypoxia or hypercapnia: J96.00

## 2016-06-08 LAB — CBC WITH DIFFERENTIAL/PLATELET
BASOS ABS: 0 10*3/uL (ref 0.0–0.1)
BASOS PCT: 0 %
EOS ABS: 0 10*3/uL (ref 0.0–0.7)
EOS PCT: 0 %
HEMATOCRIT: 46.1 % — AB (ref 36.0–46.0)
Hemoglobin: 14.4 g/dL (ref 12.0–15.0)
Lymphocytes Relative: 14 %
Lymphs Abs: 1.7 10*3/uL (ref 0.7–4.0)
MCH: 28.6 pg (ref 26.0–34.0)
MCHC: 31.2 g/dL (ref 30.0–36.0)
MCV: 91.5 fL (ref 78.0–100.0)
Monocytes Absolute: 0.4 10*3/uL (ref 0.1–1.0)
Monocytes Relative: 3 %
Neutro Abs: 9.8 10*3/uL — ABNORMAL HIGH (ref 1.7–7.7)
Neutrophils Relative %: 83 %
PLATELETS: 400 10*3/uL (ref 150–400)
RBC: 5.04 MIL/uL (ref 3.87–5.11)
RDW: 13.9 % (ref 11.5–15.5)
WBC: 11.9 10*3/uL — ABNORMAL HIGH (ref 4.0–10.5)

## 2016-06-08 LAB — COMPREHENSIVE METABOLIC PANEL
ALBUMIN: 3.7 g/dL (ref 3.5–5.0)
ALK PHOS: 53 U/L (ref 38–126)
ALT: 17 U/L (ref 14–54)
ANION GAP: 10 (ref 5–15)
AST: 18 U/L (ref 15–41)
BILIRUBIN TOTAL: 0.5 mg/dL (ref 0.3–1.2)
BUN: 13 mg/dL (ref 6–20)
CO2: 28 mmol/L (ref 22–32)
CREATININE: 0.84 mg/dL (ref 0.44–1.00)
Calcium: 9.3 mg/dL (ref 8.9–10.3)
Chloride: 102 mmol/L (ref 101–111)
GFR calc Af Amer: >60 mL/min (ref >60)
GFR calc non Af Amer: >60 mL/min (ref >60)
Glucose, Bld: 100 mg/dL — ABNORMAL HIGH (ref 65–99)
Potassium: 3.7 mmol/L (ref 3.5–5.1)
SODIUM: 140 mmol/L (ref 135–145)
TOTAL PROTEIN: 7 g/dL (ref 6.5–8.1)

## 2016-06-08 LAB — PROCALCITONIN

## 2016-06-08 LAB — TROPONIN I: Troponin I: <0.03 ng/mL (ref <0.03)

## 2016-06-08 MED ORDER — LEVOFLOXACIN IN D5W 750 MG/150ML IV SOLN
750.0000 mg | Freq: Once | INTRAVENOUS | Status: AC
  Administered 2016-06-08: 750 mg via INTRAVENOUS
  Filled 2016-06-08: qty 150

## 2016-06-08 MED ORDER — SODIUM CHLORIDE 0.9 % IV BOLUS (SEPSIS)
1000.0000 mL | Freq: Once | INTRAVENOUS | Status: AC
  Administered 2016-06-08: 1000 mL via INTRAVENOUS

## 2016-06-08 MED ORDER — SENNOSIDES-DOCUSATE SODIUM 8.6-50 MG PO TABS
1.0000 | ORAL_TABLET | Freq: Every evening | ORAL | Status: DC | PRN
  Filled 2016-06-08: qty 1

## 2016-06-08 MED ORDER — ENOXAPARIN SODIUM 40 MG/0.4ML ~~LOC~~ SOLN: 40.0000 mg | SUBCUTANEOUS | Status: DC

## 2016-06-08 MED ORDER — ONDANSETRON HCL 4 MG/2ML IJ SOLN: 4.0000 mg | Freq: Four times a day (QID) | INTRAMUSCULAR | Status: DC | PRN

## 2016-06-08 MED ORDER — MAGNESIUM CITRATE PO SOLN: 1.0000 | Freq: Once | ORAL | Status: DC | PRN

## 2016-06-08 MED ORDER — AMLODIPINE BESYLATE 5 MG PO TABS
5.0000 mg | ORAL_TABLET | Freq: Every day | ORAL | Status: DC
  Administered 2016-06-09 – 2016-06-13 (×5): 5 mg via ORAL
  Filled 2016-06-08 (×6): qty 1

## 2016-06-08 MED ORDER — ASPIRIN 81 MG PO CHEW
81.0000 mg | CHEWABLE_TABLET | Freq: Every day | ORAL | Status: DC
  Administered 2016-06-09 – 2016-06-13 (×5): 81 mg via ORAL
  Filled 2016-06-08 (×6): qty 1

## 2016-06-08 MED ORDER — HYDROCODONE-ACETAMINOPHEN 5-325 MG PO TABS: 1.0000 | ORAL_TABLET | ORAL | Status: DC | PRN

## 2016-06-08 MED ORDER — METHYLPREDNISOLONE SODIUM SUCC 125 MG IJ SOLR: 60.0000 mg | Freq: Every day | INTRAMUSCULAR | Status: DC

## 2016-06-08 MED ORDER — IPRATROPIUM BROMIDE 0.02 % IN SOLN
0.5000 mg | Freq: Once | RESPIRATORY_TRACT | Status: AC
  Administered 2016-06-08: 0.5 mg via RESPIRATORY_TRACT
  Filled 2016-06-08: qty 2.5

## 2016-06-08 MED ORDER — ACETAMINOPHEN 650 MG RE SUPP: 650.0000 mg | Freq: Four times a day (QID) | RECTAL | Status: DC | PRN

## 2016-06-08 MED ORDER — IPRATROPIUM-ALBUTEROL 0.5-2.5 (3) MG/3ML IN SOLN
3.0000 mL | RESPIRATORY_TRACT | Status: DC
  Administered 2016-06-08: 3 mL via RESPIRATORY_TRACT
  Filled 2016-06-08: qty 3

## 2016-06-08 MED ORDER — ACETAMINOPHEN 325 MG PO TABS
650.0000 mg | ORAL_TABLET | Freq: Four times a day (QID) | ORAL | Status: DC | PRN
  Administered 2016-06-08: 650 mg via ORAL
  Filled 2016-06-08: qty 2

## 2016-06-08 MED ORDER — ALBUTEROL (5 MG/ML) CONTINUOUS INHALATION SOLN
5.0000 mg/h | INHALATION_SOLUTION | Freq: Once | RESPIRATORY_TRACT | Status: AC
  Administered 2016-06-08: 5 mg/h via RESPIRATORY_TRACT
  Filled 2016-06-08: qty 20

## 2016-06-08 MED ORDER — BISACODYL 10 MG RE SUPP: 10.0000 mg | Freq: Every day | RECTAL | Status: DC | PRN

## 2016-06-08 MED ORDER — ONDANSETRON HCL 4 MG PO TABS
4.0000 mg | ORAL_TABLET | Freq: Four times a day (QID) | ORAL | Status: DC | PRN
  Administered 2016-06-12: 4 mg via ORAL
  Filled 2016-06-08: qty 1

## 2016-06-08 MED ORDER — IPRATROPIUM-ALBUTEROL 0.5-2.5 (3) MG/3ML IN SOLN
3.0000 mL | Freq: Three times a day (TID) | RESPIRATORY_TRACT | Status: DC
  Administered 2016-06-09: 3 mL via RESPIRATORY_TRACT
  Filled 2016-06-08: qty 3

## 2016-06-08 MED ORDER — MAGNESIUM SULFATE 2 GM/50ML IV SOLN
2.0000 g | Freq: Once | INTRAVENOUS | Status: AC
  Administered 2016-06-08: 2 g via INTRAVENOUS
  Filled 2016-06-08: qty 50

## 2016-06-08 MED ORDER — TRAZODONE HCL 50 MG PO TABS
25.0000 mg | ORAL_TABLET | Freq: Every evening | ORAL | Status: DC | PRN
  Administered 2016-06-09 – 2016-06-12 (×4): 25 mg via ORAL
  Filled 2016-06-08 (×4): qty 1

## 2016-06-08 MED ORDER — TRAZODONE HCL 50 MG PO TABS: 25.0000 mg | ORAL_TABLET | Freq: Every evening | ORAL | Status: DC | PRN

## 2016-06-08 MED ORDER — SENNOSIDES-DOCUSATE SODIUM 8.6-50 MG PO TABS: 1.0000 | ORAL_TABLET | Freq: Every evening | ORAL | Status: DC | PRN

## 2016-06-08 MED ORDER — METHYLPREDNISOLONE SODIUM SUCC 125 MG IJ SOLR
60.0000 mg | Freq: Two times a day (BID) | INTRAMUSCULAR | Status: DC
  Administered 2016-06-08 – 2016-06-12 (×8): 60 mg via INTRAVENOUS
  Filled 2016-06-08 (×8): qty 2

## 2016-06-08 MED ORDER — ALBUTEROL SULFATE (2.5 MG/3ML) 0.083% IN NEBU
2.5000 mg | INHALATION_SOLUTION | RESPIRATORY_TRACT | Status: DC | PRN
  Administered 2016-06-10 – 2016-06-12 (×3): 2.5 mg via RESPIRATORY_TRACT
  Filled 2016-06-08 (×3): qty 3

## 2016-06-08 MED ORDER — METHYLPREDNISOLONE SODIUM SUCC 125 MG IJ SOLR
125.0000 mg | Freq: Once | INTRAMUSCULAR | Status: AC
  Administered 2016-06-08: 125 mg via INTRAVENOUS
  Filled 2016-06-08: qty 2

## 2016-06-08 MED ORDER — GUAIFENESIN ER 600 MG PO TB12: 600.0000 mg | ORAL_TABLET | Freq: Two times a day (BID) | ORAL | Status: DC

## 2016-06-08 MED ORDER — BENZONATATE 100 MG PO CAPS
200.0000 mg | ORAL_CAPSULE | Freq: Three times a day (TID) | ORAL | Status: DC | PRN
  Administered 2016-06-08 – 2016-06-12 (×4): 200 mg via ORAL
  Filled 2016-06-08 (×4): qty 2

## 2016-06-08 MED ORDER — ALBUTEROL SULFATE (2.5 MG/3ML) 0.083% IN NEBU
5.0000 mg | INHALATION_SOLUTION | Freq: Once | RESPIRATORY_TRACT | Status: AC
  Administered 2016-06-08: 5 mg via RESPIRATORY_TRACT
  Filled 2016-06-08: qty 6

## 2016-06-08 MED ORDER — ENOXAPARIN SODIUM 40 MG/0.4ML ~~LOC~~ SOLN
40.0000 mg | SUBCUTANEOUS | Status: DC
  Administered 2016-06-08 – 2016-06-13 (×6): 40 mg via SUBCUTANEOUS
  Filled 2016-06-08 (×6): qty 0.4

## 2016-06-08 MED ORDER — ONDANSETRON HCL 4 MG PO TABS: 4.0000 mg | ORAL_TABLET | Freq: Four times a day (QID) | ORAL | Status: DC | PRN

## 2016-06-08 MED ORDER — LEVOFLOXACIN IN D5W 750 MG/150ML IV SOLN: 750.0000 mg | INTRAVENOUS | Status: DC

## 2016-06-08 NOTE — ED Triage Notes (Signed)
Pt c/o SOB and chest pressure. Pt has been taking antibiotics and steroids for acute bronchitis for the last 3 days. Pt has bilateral wheezing. Pt denies n/v/d. Pt is coughing up clear mucous.

## 2016-06-08 NOTE — H&P (Signed)
History and Physical    Katie Waller:[id number] DOB: 1946/01/05 DOA: 06/08/2016   PCP: Katie PatellaEADE,ROBERT ALEXANDER, MD   Patient coming from:  Home   Chief Complaint: Shortness of breath and cough  HPI: Katie Waller is a 70 y.o. 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 Despite this, her shortness of breath became more evident. She reports a sick contact from a family member who was hospitalized a week prior with pneumonia. Denies nausea and emesis.  Patient's use of home inhalers only give her intermittent relief. Denies any fevers, or chills. She denies any cardiac complaints.Patient denies any history of cancer  ED Course:  BP 159/73   Pulse (!) 123   Temp 98.1 F (36.7 C) (Oral)   Resp 24   Ht 5\' 1"  (1.549 m)   Wt 61.2 kg (135 lb)   SpO2 99%   BMI 25.51 kg/m    CXR shows a Rounded airspace opacity in the left upper lobe may be infectious or inflammatory in etiology.   CMET normal  CBC remarkable for WBC 11.(steroids/reactive)  Tn < 0.03  Received nebs and Solumedrol 125 mg IV x1  Levaquin 750 mg IV  IVF 1 l NS MgSo4 2 g x 1  Review of Systems: As per HPI otherwise 10 point review of systems negative.   Past Medical History:  Diagnosis Date  . Asthma   . Diabetes (HCC)   . Heart disease   . Hypertension     Past Surgical History:  Procedure Laterality Date  . KNEE SURGERY Left     Social History Social History   Social History  . Marital status: Divorced    Spouse name: N/A  . Number of children: N/A  . Years of education: N/A   Occupational History  . Not on file.   Social History Main Topics  . Smoking status: Current Every Day Smoker      Types: Cigarettes  . Smokeless tobacco: Never Used     Comment: vapor  . Alcohol use No  . Drug use: No  . Sexual activity: No   Other Topics Concern  . Not on file   Social History Narrative  . No narrative on file     Allergies  Allergen Reactions  . Codeine Nausea And Vomiting    Family History  Problem Relation Age of Onset  . Hyperlipidemia Mother   . Hypertension Mother   . Hypertension Father   . Hyperlipidemia Father       Prior to Admission medications   Medication Sig Start Date End Date Taking? Authorizing Provider  albuterol (PROVENTIL HFA;VENTOLIN HFA) 108 (90 BASE) MCG/ACT inhaler Inhale 2 puffs into the lungs every 6 (six) hours as needed for wheezing or shortness of breath. 09/19/13  Yes Clanford Cyndie MullL Johnson, MD  amLODipine (NORVASC) 5 MG tablet Take 5 mg by mouth daily.   Yes Historical Provider, MD  aspirin 81 MG chewable tablet Chew 1 tablet (81 mg total) by mouth daily. 09/19/13  Yes Clanford Cyndie MullL Johnson, MD  azithromycin (ZITHROMAX) 250 MG tablet Take 250-500 mg by mouth See admin instructions. Take 500 mg by mouth on day 1, take 250 mg by mouth daily for 4  days. 06/05/16  Yes Historical Provider, MD  predniSONE (DELTASONE) 20 MG tablet Take 40-60 mg by mouth See admin instructions. Take 60 mg by mouth daily for 3 days, then take 40 mg by mouth for 3 days. 06/05/16  Yes Historical Provider, MD  raloxifene (EVISTA) 60 MG tablet Take 60 mg by mouth every morning. 05/12/16  Yes Historical Provider, MD  SYMBICORT 160-4.5 MCG/ACT inhaler Inhale 2 puffs into the lungs 2 (two) times daily. 06/04/16  Yes Historical Provider, MD  lisinopril-hydrochlorothiazide (ZESTORETIC) 10-12.5 MG per tablet Take 1 tablet by mouth daily. Patient not taking: Reported on 06/08/2016 09/19/13   Cleora Fleet, MD    Physical Exam:    Vitals:   06/08/16 1115 06/08/16 1130 06/08/16 1200 06/08/16 1230  BP: 158/86 160/78 165/85 159/73  Pulse: 96 106 105 (!) 123  Resp: 20 24 24 24    Temp:      TempSrc:      SpO2: 100% 100% 99% 99%  Weight:      Height:           Constitutional: NAD, calm, somewhat uncomfortable due to cough. Wearing vent mask during nebs treatment  Vitals:   06/08/16 1115 06/08/16 1130 06/08/16 1200 06/08/16 1230  BP: 158/86 160/78 165/85 159/73  Pulse: 96 106 105 (!) 123  Resp: 20 24 24 24   Temp:      TempSrc:      SpO2: 100% 100% 99% 99%  Weight:      Height:       Eyes: PERRL, lids and conjunctivae normal ENMT: Mucous membranes are moist. Posterior pharynx clear of any exudate or lesions.Normal dentition.  Neck: normal, supple, no masses, no thyromegaly Respiratory: decreased breath sounds at the LUL, no wheezing, no crackles. Normal respiratory effort. No accessory muscle use.  Cardiovascular: Regular rate and rhythm, no murmurs / rubs / gallops. No extremity edema. 2+ pedal pulses. No carotid bruits.  Abdomen: no tenderness, no masses palpated. No hepatosplenomegaly. Bowel sounds positive.  Musculoskeletal: no clubbing / cyanosis. No joint deformity upper and lower extremities. Good ROM, no contractures. Normal muscle tone.  Skin: no rashes, lesions, ulcers.  Neurologic: CN 2-12 grossly intact. Sensation intact, DTR normal. Strength 5/5 in all 4.  Psychiatric: Normal judgment and insight. Alert and oriented x 3. Normal mood.     Labs on Admission: I have personally reviewed following labs and imaging studies  CBC:  Recent Labs Lab 06/08/16 0935  WBC 11.9*  NEUTROABS 9.8*  HGB 14.4  HCT 46.1*  MCV 91.5  PLT 400    Basic Metabolic Panel:  Recent Labs Lab 06/08/16 0935  NA 140  K 3.7  CL 102  CO2 28  GLUCOSE 100*  BUN 13  CREATININE 0.84  CALCIUM 9.3    GFR: Estimated Creatinine Clearance: 54.6 mL/min (by C-G formula based on SCr of 0.84 mg/dL).  Liver Function Tests:  Recent Labs Lab 06/08/16 0935  AST 18  ALT 17  ALKPHOS 53  BILITOT 0.5  PROT 7.0  ALBUMIN 3.7   No results for input(s): LIPASE,  AMYLASE in the last 168 hours. No results for input(s): AMMONIA in the last 168 hours.  Coagulation Profile: No results for input(s): INR, PROTIME in the last 168 hours.  Cardiac Enzymes:  Recent Labs Lab 06/08/16 0935  TROPONINI <0.03    BNP (last 3 results) No results for input(s): PROBNP in the last 8760 hours.  HbA1C: No results for input(s): HGBA1C in the last 72 hours.  CBG: No results for input(s): GLUCAP in the last 168 hours.  Lipid Profile: No results for input(s): CHOL, HDL, LDLCALC, TRIG, CHOLHDL, LDLDIRECT in the last 72 hours.  Thyroid Function Tests: No results for input(s): TSH, T4TOTAL, FREET4, T3FREE, THYROIDAB in the last 72 hours.  Anemia Panel: No results for input(s): VITAMINB12, FOLATE, FERRITIN, TIBC, IRON, RETICCTPCT in the last 72 hours.  Urine analysis:    Component Value Date/Time   COLORURINE YELLOW 09/17/2013 0057   APPEARANCEUR CLEAR 09/17/2013 0057   LABSPEC 1.021 09/17/2013 0057   PHURINE 6.5 09/17/2013 0057   GLUCOSEU NEGATIVE 09/17/2013 0057   HGBUR NEGATIVE 09/17/2013 0057   BILIRUBINUR NEGATIVE 09/17/2013 0057   KETONESUR NEGATIVE 09/17/2013 0057   PROTEINUR NEGATIVE 09/17/2013 0057   UROBILINOGEN 0.2 09/17/2013 0057   NITRITE NEGATIVE 09/17/2013 0057   LEUKOCYTESUR NEGATIVE 09/17/2013 0057    Sepsis Labs: @LABRCNTIP (procalcitonin:4,lacticidven:4) )No results found for this or any previous visit (from the past 240 hour(s)).   Radiological Exams on Admission: Dg Chest 2 View  Result Date: 06/08/2016 CLINICAL DATA:  Shortness of breath and chest pressure.  Wheezing. EXAM: CHEST  2 VIEW COMPARISON:  12/10/2014. FINDINGS: Trachea is midline. Heart size normal. Thoracic aorta is calcified. Lungs are hyperinflated. A small rounded area of airspace opacification is seen in the peripheral left upper lobe and appears new. Lungs are otherwise clear. No pleural fluid. IMPRESSION: 1. Rounded airspace opacity in the left upper lobe may  be infectious or inflammatory in etiology. Malignancy can also have this appearance. Followup PA and lateral chest X-ray is recommended in 3-4 weeks following trial of antibiotic therapy to ensure resolution and exclude underlying malignancy. 2. Aortic atherosclerosis. Electronically Signed   By: Leanna Battles M.D.   On: 06/08/2016 10:09    EKG: Independently reviewed.  Assessment/Plan Active Problems:   Acute respiratory failure (HCC)   COPD exacerbation (HCC)   Smoker   Essential hypertension   Acute respiratory distress  .  Acute hypoxic respiratory distress without hypoxia secondary to ongoing COPD exacerbation failing outpatient treatment rule out pneumonia  Afebrile, VSS, Sat 100 in Vent mask Tele Obs CXR shows a Rounded airspace opacity in the left upper lobe. CBC remarkable for WBC 11.(steroids/reactive). Received nebs and Solumedrol 125 mg IV x1,Levaquin 750 mg IV, IVF 1 l NS, MgSo4 2 g x 1  Tele obs  Pneumonia order set  - Continue Levaquin - Duonebs q4 hrs prn - Solu-Medrol 60 mg IV bid - Mucinex - O2 - CBC Blood and sputum cultures Tobacco Cessation program/ nicotine prn  May follow up with CT chest if symptoms do not improve    Hypertension BP 159/73   Pulse (!) 123   Temp 98.1 F (36.7 C) (Oral)   Resp 24   Ht 5\' 1"  (1.549 m)   Wt 61.2 kg (135 lb)   SpO2 99%   BMI 25.51 kg/m  Controlled Continue home anti-hypertensive medications    DVT prophylaxis: Lovenox  Code Status:   Full   Family Communication:  Discussed with patient Disposition Plan: Expect patient to be discharged to home after condition improves Consults called:    None Admission status:Tele  Obs     Demichael Traum E, PA-C [facility]   If 7PM-7AM, please contact night-coverage [email or web address] Password [email or web address]  06/08/2016, 1:43 PM

## 2016-06-08 NOTE — ED Provider Notes (Signed)
[facility]-EMERGENCY DEPT Provider Note   CSN: [id number] Arrival date & time: 06/08/16  0825     History   Chief Complaint No chief complaint on file.   HPI Katie Waller is a 70 y.o. female.  Patient is a 70 year old female with past medical history of COPD, chronic bronchitis, 40-pack-year smoking history, presenting with worsening shortness of breath and productive cough over the past 3 days. Reports cold-like symptoms including rhinorrhea, congestion, and cough over the past week. Was evaluated by her primary care doctor on Saturday (who is a friend and saw her a family gathering) and started her on azithromycin and prednisone taper. Reports minimal improvement, and advised to come to the ED by her primary care doctor. Denies fever, chills, chest pain, palpitations, swelling or pain in legs, abdominal pain, nausea, or vomiting. No cardiac hx or diagnosis of CHF, but reports orthopnea and requires 2 pillows when sleeping, as well as occasional swelling of ankles.       Past Medical History:  Diagnosis Date  . Asthma   . Diabetes (HCC)   . Heart disease   . Hypertension     Patient Active Problem List   Diagnosis Date Noted  . Abnormal finding on EKG 09/19/2013  . Prediabetes 09/19/2013  . Sinus tachycardia 09/18/2013  . Unspecified essential hypertension 09/18/2013  . Near syncope 09/17/2013  . COPD exacerbation (HCC) 09/17/2013  . Smoker 09/17/2013    Past Surgical History:  Procedure Laterality Date  . KNEE SURGERY Left     OB History    No data available       Home Medications    Prior to Admission medications   Medication Sig Start Date End Date Taking? Authorizing Provider  albuterol (PROVENTIL HFA;VENTOLIN HFA) 108 (90 BASE) MCG/ACT inhaler Inhale 2 puffs into the lungs every 6 (six) hours as needed for wheezing or shortness of breath. 09/19/13   Clanford Cyndie MullL Johnson, MD  aspirin 81 MG chewable tablet Chew 1 tablet (81 mg total) by mouth daily. 09/19/13    Clanford Cyndie MullL Johnson, MD  lisinopril-hydrochlorothiazide (ZESTORETIC) 10-12.5 MG per tablet Take 1 tablet by mouth daily. 09/19/13   Clanford Cyndie MullL Johnson, MD  LORazepam (ATIVAN) 0.5 MG tablet Take 0.5 mg by mouth at bedtime.    Historical Provider, MD    Family History Family History  Problem Relation Age of Onset  . Hyperlipidemia Mother   . Hypertension Mother   . Hypertension Father   . Hyperlipidemia Father     Social History Social History  Substance Use Topics  . Smoking status: Current Every Day Smoker    Types: Cigarettes  . Smokeless tobacco: Never Used     Comment: vapor  . Alcohol use No     Allergies   Codeine   Review of Systems Review of Systems  Constitutional: Negative for chills and fever.  HENT: Positive for congestion, rhinorrhea and sneezing. Negative for ear pain and sore throat.   Eyes: Negative for pain and visual disturbance.  Respiratory: Positive for cough, chest tightness, shortness of breath and wheezing.   Cardiovascular: Negative for chest pain, palpitations and leg swelling.  Gastrointestinal: Negative for abdominal pain, blood in stool, nausea and vomiting.  Genitourinary: Negative for dysuria and hematuria.  Musculoskeletal: Negative for back pain and neck pain.  Skin: Negative for color change and rash.  Neurological: Negative for dizziness, seizures, syncope and headaches.  All other systems reviewed and are negative.    Physical Exam Updated Vital Signs BP 157/87  Pulse 90   Temp 98.1 F (36.7 C) (Oral)   Resp 22   Ht 5\' 1"  (1.549 m)   Wt 61.2 kg   SpO2 97%   BMI 25.51 kg/m   Physical Exam  Constitutional:  Patient is WDWN, elderly female, mild dyspneic but O2 sat 96% RA  HENT:  Head: Normocephalic and atraumatic.  Mouth/Throat: Oropharynx is clear and moist.  Eyes: Conjunctivae are normal.  Neck: Normal range of motion. Neck supple. No JVD present.  Cardiovascular: Normal rate, regular rhythm, normal heart sounds and  intact distal pulses.   Pulmonary/Chest:  Diffuse expiratory wheezes bilaterally, no respiratory distress, normal thoracic expansion and no accessory muscle use. No TTP chest wall or ribs.  Abdominal: Soft. There is no tenderness.  Musculoskeletal: Normal range of motion. She exhibits no edema.  Mild TTP left scapula  Lymphadenopathy:    She has no cervical adenopathy.  Neurological: She is alert.  Skin: Skin is warm and dry. No rash noted.  Diffuse greasy plaques consistent in appearance with seborrheic keratosis noted to patient's back and chest  Psychiatric: She has a normal mood and affect.     ED Treatments / Results  Labs (all labs ordered are listed, but only abnormal results are displayed) Labs Reviewed  COMPREHENSIVE METABOLIC PANEL  CBC WITH DIFFERENTIAL/PLATELET    EKG  EKG Interpretation  Date/Time:  Wednesday June 08 2016 08:49:33 EDT Ventricular Rate:  95 PR Interval:    QRS Duration: 74 QT Interval:  347 QTC Calculation: 437 R Axis:   80 Text Interpretation:  Atrial-paced complexes LAE, consider biatrial enlargement Anteroseptal infarct, age indeterminate Confirmed by KOHUT  MD, STEPHEN [phone]) on 06/08/2016 9:33:45 AM       Radiology No results found.  Procedures Procedures (including critical care time)  Medications Ordered in ED Medications  magnesium sulfate IVPB 2 g 50 mL (2 g Intravenous New Bag/Given 06/08/16 0929)  methylPREDNISolone sodium succinate (SOLU-MEDROL) 125 mg/2 mL injection 125 mg (125 mg Intravenous Given 06/08/16 0929)  sodium chloride 0.9 % bolus 1,000 mL (1,000 mLs Intravenous New Bag/Given 06/08/16 0929)  ipratropium (ATROVENT) nebulizer solution 0.5 mg (0.5 mg Nebulization Given 06/08/16 0928)  albuterol (PROVENTIL) (2.5 MG/3ML) 0.083% nebulizer solution 5 mg (5 mg Nebulization Given 06/08/16 0928)     Initial Impression / Assessment and Plan / ED Course  I have reviewed the triage vital signs and the nursing  notes.  Pertinent labs & imaging results that were available during my care of the patient were reviewed by me and considered in my medical decision making (see chart for details).  Clinical Course   Patient is 70 yo F with PMH of COPD, chronic bronchitis, and 40 pack year smoking hx, presenting with worsening shortness of breath and productive cough over the past 3 days. Was prescribed azithromycin and prednisone by PCP on Saturday with no relief of symptoms. She is moderately dyspneic and has diffuse wheezes bilaterally. Given Duoneb treatment, IV mag, and solumedrol. EKG unremarkable, and CXR shows no consolidation; however, patient remained dyspneic on exam with constant "chest tightness." Aeration did not improve and patient had diffuse wheezes on reevaluation. Continuous nebulizer and empiric Levaquin ordered. Given little improvement in condition, consult placed by attending physician, Dr. Raeford Razor, for COPD exacerbation. Admitting physician accepted, and updated patient and family, who agreed to plan.   Final Clinical Impressions(s) / ED Diagnoses   Final diagnoses:  Pneumonia    New Prescriptions New Prescriptions   No medications on file  Ivonne Andrew Garfield II, [place] 06/08/16 1245    Raeford Razor, MD 06/08/16 [phone]

## 2016-06-09 ENCOUNTER — Observation Stay (HOSPITAL_COMMUNITY): Payer: Commercial Managed Care - HMO

## 2016-06-09 DIAGNOSIS — R0603 Acute respiratory distress: Secondary | ICD-10-CM

## 2016-06-09 DIAGNOSIS — F17200 Nicotine dependence, unspecified, uncomplicated: Secondary | ICD-10-CM | POA: Diagnosis not present

## 2016-06-09 DIAGNOSIS — I10 Essential (primary) hypertension: Secondary | ICD-10-CM | POA: Diagnosis present

## 2016-06-09 DIAGNOSIS — I878 Other specified disorders of veins: Secondary | ICD-10-CM | POA: Diagnosis present

## 2016-06-09 DIAGNOSIS — J440 Chronic obstructive pulmonary disease with acute lower respiratory infection: Secondary | ICD-10-CM | POA: Diagnosis present

## 2016-06-09 DIAGNOSIS — F17210 Nicotine dependence, cigarettes, uncomplicated: Secondary | ICD-10-CM | POA: Diagnosis present

## 2016-06-09 DIAGNOSIS — J441 Chronic obstructive pulmonary disease with (acute) exacerbation: Secondary | ICD-10-CM | POA: Diagnosis present

## 2016-06-09 DIAGNOSIS — J189 Pneumonia, unspecified organism: Secondary | ICD-10-CM | POA: Diagnosis present

## 2016-06-09 DIAGNOSIS — R05 Cough: Secondary | ICD-10-CM | POA: Diagnosis not present

## 2016-06-09 DIAGNOSIS — J9601 Acute respiratory failure with hypoxia: Secondary | ICD-10-CM | POA: Diagnosis present

## 2016-06-09 DIAGNOSIS — E119 Type 2 diabetes mellitus without complications: Secondary | ICD-10-CM | POA: Diagnosis present

## 2016-06-09 DIAGNOSIS — J9600 Acute respiratory failure, unspecified whether with hypoxia or hypercapnia: Secondary | ICD-10-CM | POA: Diagnosis not present

## 2016-06-09 DIAGNOSIS — Z8249 Family history of ischemic heart disease and other diseases of the circulatory system: Secondary | ICD-10-CM | POA: Diagnosis not present

## 2016-06-09 DIAGNOSIS — Z7982 Long term (current) use of aspirin: Secondary | ICD-10-CM | POA: Diagnosis not present

## 2016-06-09 LAB — CBC
HEMATOCRIT: 40.6 % (ref 36.0–46.0)
HEMOGLOBIN: 12.8 g/dL (ref 12.0–15.0)
MCH: 28.6 pg (ref 26.0–34.0)
MCHC: 31.5 g/dL (ref 30.0–36.0)
MCV: 90.8 fL (ref 78.0–100.0)
Platelets: 386 10*3/uL (ref 150–400)
RBC: 4.47 MIL/uL (ref 3.87–5.11)
RDW: 13.7 % (ref 11.5–15.5)
WBC: 9.5 10*3/uL (ref 4.0–10.5)

## 2016-06-09 LAB — STREP PNEUMONIAE URINARY ANTIGEN: Strep Pneumo Urinary Antigen: NEGATIVE

## 2016-06-09 LAB — COMPREHENSIVE METABOLIC PANEL
ALK PHOS: 47 U/L (ref 38–126)
ALT: 15 U/L (ref 14–54)
AST: 17 U/L (ref 15–41)
Albumin: 3.2 g/dL — ABNORMAL LOW (ref 3.5–5.0)
Anion gap: 14 (ref 5–15)
BILIRUBIN TOTAL: 0.2 mg/dL — AB (ref 0.3–1.2)
BUN: 11 mg/dL (ref 6–20)
CO2: 28 mmol/L (ref 22–32)
CREATININE: 0.7 mg/dL (ref 0.44–1.00)
Calcium: 9.2 mg/dL (ref 8.9–10.3)
Chloride: 99 mmol/L — ABNORMAL LOW (ref 101–111)
GFR calc Af Amer: >60 mL/min (ref >60)
Glucose, Bld: 118 mg/dL — ABNORMAL HIGH (ref 65–99)
POTASSIUM: 4.1 mmol/L (ref 3.5–5.1)
Sodium: 141 mmol/L (ref 135–145)
TOTAL PROTEIN: 5.8 g/dL — AB (ref 6.5–8.1)

## 2016-06-09 MED ORDER — IPRATROPIUM-ALBUTEROL 0.5-2.5 (3) MG/3ML IN SOLN
3.0000 mL | Freq: Four times a day (QID) | RESPIRATORY_TRACT | Status: DC
  Administered 2016-06-09 – 2016-06-13 (×18): 3 mL via RESPIRATORY_TRACT
  Filled 2016-06-09 (×17): qty 3

## 2016-06-09 MED ORDER — DM-GUAIFENESIN ER 30-600 MG PO TB12
1.0000 | ORAL_TABLET | Freq: Two times a day (BID) | ORAL | Status: DC
  Administered 2016-06-09 – 2016-06-13 (×9): 1 via ORAL
  Filled 2016-06-09 (×9): qty 1

## 2016-06-09 MED ORDER — AZITHROMYCIN 500 MG PO TABS
250.0000 mg | ORAL_TABLET | Freq: Once | ORAL | Status: AC
  Administered 2016-06-09: 250 mg via ORAL
  Filled 2016-06-09: qty 1

## 2016-06-09 NOTE — Care Management Obs Status (Signed)
MEDICARE OBSERVATION STATUS NOTIFICATION   Patient Details  Name: Katie Waller MRN: [id number] Date of Birth: 06/01/1946   Medicare Observation Status Notification Given:  Yes    Antony HasteBennett, Jasa Dundon Harris, RN 06/09/2016, 7:29 PM

## 2016-06-09 NOTE — Evaluation (Signed)
Occupational Therapy Evaluation and Discharge Patient Details Name: Katie Waller MRN: [id number] DOB: [birth year] Today's Date: 06/09/2016    History of Present Illness Pt admitted with COPD exacerbation. PMH: tobacco use, DM, heart disease and HTN. Pt with no intention of quitting smoking.   Clinical Impression   Pt with poor activity tolerance and SOB, but performing ADL and ambulation at a modified independently. Educated in energy conservation and pursed lip breathing. No further OT needs.    Follow Up Recommendations  No OT follow up    Equipment Recommendations  None recommended by OT    Recommendations for Other Services       Precautions / Restrictions Precautions Precautions: None      Mobility Bed Mobility Overal bed mobility: Modified Independent                Transfers Overall transfer level: Modified independent                    Balance                                            ADL Overall ADL's : Modified independent                                       General ADL Comments: Educated in energy conservation at provided handout.     Vision     Perception     Praxis      Pertinent Vitals/Pain Pain Assessment: No/denies pain     Hand Dominance Right   Extremity/Trunk Assessment Upper Extremity Assessment Upper Extremity Assessment: Overall WFL for tasks assessed   Lower Extremity Assessment Lower Extremity Assessment: Overall WFL for tasks assessed   Cervical / Trunk Assessment Cervical / Trunk Assessment: Normal   Communication Communication Communication: No difficulties   Cognition Arousal/Alertness: Awake/alert Behavior During Therapy: WFL for tasks assessed/performed Overall Cognitive Status: Within Functional Limits for tasks assessed                     General Comments       Exercises       Shoulder Instructions      Home Living Family/patient  expects to be discharged to:: Private residence Living Arrangements: Children Available Help at Discharge: Family;Available PRN/intermittently Type of Home: House Home Access: Stairs to enter [employer] of Steps: 1+1+1   Home Layout: One level     Bathroom Shower/Tub: Chief Strategy Officer: Standard     Home Equipment: None          Prior Functioning/Environment Level of Independence: Independent        Comments: pt drives, 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        OT Problem List: Decreased activity tolerance   OT Treatment/Interventions:      OT Goals(Current goals can be found in the care plan section) Acute Rehab OT Goals Patient Stated Goal: to go back to work  OT Frequency:     Barriers to D/C:            Co-evaluation              End of Session    Activity Tolerance: Patient limited by fatigue Patient left: in  chair;with call bell/phone within reach;with family/visitor present   Time: 1200-1216 OT Time Calculation (min): 16 min Charges:  OT General Charges $OT Visit: 1 Procedure OT Evaluation $OT Eval Moderate Complexity: 1 Procedure G-Codes: OT G-codes **NOT FOR INPATIENT CLASS** Functional Assessment Tool Used: clinical judgement Functional Limitation: Self care Self Care Current Status ([id number]): At least 1 percent but less than 20 percent impaired, limited or restricted Self Care Discharge Status [phone](G8989): At least 1 percent but less than 20 percent impaired, limited or restricted  Evern BioMayberry, Fernado Brigante Lynn 06/09/2016, 1:44 PM [phone]

## 2016-06-09 NOTE — Progress Notes (Signed)
PROGRESS NOTE    Katie Waller  WUJ:[id number] DOB: 04/26/1946 DOA: 06/08/2016  PCP: Katie Patella, MD   Brief Narrative:  Katie Waller is a 70 y.o. 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  Subjective: Coughing up clear sputum, dyspnea better but not resolved.   Assessment & Plan:   Active Problems:   COPD exacerbation/  Acute respiratory failure - cont STeroids, Nebs O2 as needed - CXR initially suspicious of LUL opacity suggestive of pneumonia however repeat CXR PA/lat doe not show this - stop Levaquin- complete Zpak- last dose tomorrow    Smoker - 1/2 ppd - have discussed cessation  - she feels that she does not need a nicotine patch    Essential hypertension - Amlodipine    Acute respiratory distress   (HCC)   DVT prophylaxis: Lovenox Code Status: Full code Family Communication: daughter Disposition Plan: home when stable Consultants:    Procedures:    Antimicrobials:  Anti-infectives    Start     Dose/Rate Route Frequency Ordered Stop   06/09/16 0930  azithromycin (ZITHROMAX) tablet 250 mg     250 mg Oral  Once 06/09/16 0900 06/09/16 0918   06/08/16 1245  levofloxacin (LEVAQUIN) IVPB 750 mg  Status:  Discontinued     750 mg 100 mL/hr over 90 Minutes Intravenous Every 24 hours 06/08/16 1235 06/09/16 0900   06/08/16 1145  levofloxacin (LEVAQUIN) IVPB 750 mg     750 mg 100 mL/hr over 90 Minutes Intravenous  Once 06/08/16 1137 06/08/16 1410       Objective: Vitals:   06/09/16 0535 06/09/16 0806 06/09/16 0813 06/09/16 1202  BP: (!) 136/94  (!) 151/69   Pulse: (!) 105  (!) 104   Resp: (!) 21  (!) 22   Temp: 97.9 F (36.6 C)  97.6 F (36.4 C)   TempSrc: Oral  Axillary   SpO2: 97% 98% 98% 97%  Weight:      Height:         Intake/Output Summary (Last 24 hours) at 06/09/16 1549 Last data filed at 06/09/16 1509  Gross per 24 hour  Intake              840 ml  Output                0 ml  Net              840 ml   Filed Weights   06/08/16 0844 06/08/16 2106  Weight: 61.2 kg (135 lb) 61.3 kg (135 lb 2.3 oz)    Examination: General exam: Appears comfortable  HEENT: PERRLA, oral mucosa moist, no sclera icterus or thrush Respiratory system: b/l wheeze through out lung fields- RR in low 20s Cardiovascular system: S1 & S2 heard, RRR.  No murmurs  Gastrointestinal system: Abdomen soft, non-tender, nondistended. Normal bowel sound. No organomegaly Central nervous system: Alert and oriented. No focal neurological deficits. Extremities: No cyanosis, clubbing or edema Skin: No rashes or ulcers Psychiatry:  Mood & affect appropriate.     Data Reviewed: I have personally reviewed following labs and imaging studies  CBC:  Recent Labs Lab 06/08/16 0935 06/09/16 0516  WBC 11.9* 9.5  NEUTROABS 9.8*  --   HGB 14.4 12.8  HCT 46.1* 40.6  MCV 91.5 90.8  PLT 400 386   Basic Metabolic Panel:  Recent Labs Lab 06/08/16 0935 06/09/16 0516  NA 140 141  K 3.7 4.1  CL 102 99*  CO2 28 28  GLUCOSE 100* 118*  BUN 13 11  CREATININE 0.84 0.70  CALCIUM 9.3 9.2   GFR: Estimated Creatinine Clearance: 57.3 mL/min (by C-G formula based on SCr of 0.7 mg/dL). Liver Function Tests:  Recent Labs Lab 06/08/16 0935 06/09/16 0516  AST 18 17  ALT 17 15  ALKPHOS 53 47  BILITOT 0.5 0.2*  PROT 7.0 5.8*  ALBUMIN 3.7 3.2*   No results for input(s): LIPASE, AMYLASE in the last 168 hours. No results for input(s): AMMONIA in the last 168 hours. Coagulation Profile: No results for input(s): INR, PROTIME in the last 168 hours. Cardiac Enzymes:  Recent Labs Lab 06/08/16 0935  TROPONINI <0.03   BNP (last 3 results) No results for input(s): PROBNP in the last 8760 hours. HbA1C: No results for input(s): HGBA1C  in the last 72 hours. CBG: No results for input(s): GLUCAP in the last 168 hours. Lipid Profile: No results for input(s): CHOL, HDL, LDLCALC, TRIG, CHOLHDL, LDLDIRECT in the last 72 hours. Thyroid Function Tests: No results for input(s): TSH, T4TOTAL, FREET4, T3FREE, THYROIDAB in the last 72 hours. Anemia Panel: No results for input(s): VITAMINB12, FOLATE, FERRITIN, TIBC, IRON, RETICCTPCT in the last 72 hours. Urine analysis:    Component Value Date/Time   COLORURINE YELLOW 09/17/2013 0057   APPEARANCEUR CLEAR 09/17/2013 0057   LABSPEC 1.021 09/17/2013 0057   PHURINE 6.5 09/17/2013 0057   GLUCOSEU NEGATIVE 09/17/2013 0057   HGBUR NEGATIVE 09/17/2013 0057   BILIRUBINUR NEGATIVE 09/17/2013 0057   KETONESUR NEGATIVE 09/17/2013 0057   PROTEINUR NEGATIVE 09/17/2013 0057   UROBILINOGEN 0.2 09/17/2013 0057   NITRITE NEGATIVE 09/17/2013 0057   LEUKOCYTESUR NEGATIVE 09/17/2013 0057   Sepsis Labs: @LABRCNTIP (procalcitonin:4,lacticidven:4) ) Recent Results (from the past 240 hour(s))  Culture, blood (routine x 2) Call MD if unable to obtain prior to antibiotics being given     Status: None (Preliminary result)   Collection Time: 06/08/16  1:48 PM  Result Value Ref Range Status   Specimen Description BLOOD LEFT ANTECUBITAL  Final   Special Requests   Final    BOTTLES DRAWN AEROBIC AND ANAEROBIC 10CC AER 5CC ANA   Culture NO GROWTH < 24 HOURS  Final   Report Status PENDING  Incomplete  Culture, blood (routine x 2) Call MD if unable to obtain prior to antibiotics being given     Status: None (Preliminary result)   Collection Time: 06/08/16  1:56 PM  Result Value Ref Range Status   Specimen Description BLOOD LEFT HAND  Final   Special Requests BOTTLES DRAWN AEROBIC ONLY 10CC  Final   Culture NO GROWTH < 24 HOURS  Final   Report Status PENDING  Incomplete         Radiology Studies: Dg Chest 2 View  Result Date: 06/09/2016 CLINICAL DATA:  Respiratory failure, chest congestion,  cough. History of COPD, current smoker EXAM: CHEST  2 VIEW COMPARISON:  PA and lateral chest x-ray of June 08, 2016 FINDINGS: The lungs remain hyperinflated with hemidiaphragm flattening. There is no alveolar infiltrate or pleural effusion. The heart and pulmonary vascularity are normal. The mediastinum is normal in width. There is calcification in the wall of the aortic arch. The bony thorax exhibits no acute abnormality. IMPRESSION: COPD.  No pneumonia nor CHF. Aortic atherosclerosis. Electronically Signed  By: David  [place] M.D.   On: 06/09/2016 07:49   Dg Chest 2 View  Result Date: 06/08/2016 CLINICAL DATA:  Shortness of breath and chest pressure.  Wheezing. EXAM: CHEST  2 VIEW COMPARISON:  12/10/2014. FINDINGS: Trachea is midline. Heart size normal. Thoracic aorta is calcified. Lungs are hyperinflated. A small rounded area of airspace opacification is seen in the peripheral left upper lobe and appears new. Lungs are otherwise clear. No pleural fluid. IMPRESSION: 1. Rounded airspace opacity in the left upper lobe may be infectious or inflammatory in etiology. Malignancy can also have this appearance. Followup PA and lateral chest X-ray is recommended in 3-4 weeks following trial of antibiotic therapy to ensure resolution and exclude underlying malignancy. 2. Aortic atherosclerosis. Electronically Signed   By: Leanna Battles M.D.   On: 06/08/2016 10:09      Scheduled Meds: . amLODipine  5 mg Oral Daily  . aspirin  81 mg Oral Daily  . dextromethorphan-guaiFENesin  1 tablet Oral BID  . enoxaparin (LOVENOX) injection  40 mg Subcutaneous Q24H  . ipratropium-albuterol  3 mL Nebulization QID  . methylPREDNISolone (SOLU-MEDROL) injection  60 mg Intravenous Q12H   Continuous Infusions:    LOS: 0 days    Time spent in minutes: 35    Jarelle Ates, MD [facility] Pager: [email or web address] Password [email or web address] 06/09/2016, 3:49 PM

## 2016-06-10 DIAGNOSIS — J9601 Acute respiratory failure with hypoxia: Secondary | ICD-10-CM

## 2016-06-10 LAB — PROCALCITONIN

## 2016-06-10 NOTE — Progress Notes (Signed)
PROGRESS NOTE    Katie Waller  UJW:[id number] DOB: 07-Nov-1945 DOA: 06/08/2016  PCP: Lolita Patella, MD   Brief Narrative:  Katie Waller is a 70 y.o. female with medical history significant for COPD/ chronic bronchitis, tobacco abuse, HTN, atherosclerosis, Grade 1 dCHF EF 67-70%, presenting to the ED with 1 week  history of increasing dyspnea and a cough which did not improve with Prednisone or a Z pak.  Subjective: Was feeling better yesterday and slept well through the night. This morning she has been short of breath and coughing.   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    DVT prophylaxis: Lovenox Code Status: Full code Family Communication: daughter Disposition Plan: home when stable Consultants:    Procedures:    Antimicrobials:  Anti-infectives    Start     Dose/Rate Route Frequency Ordered Stop   06/09/16 0930  azithromycin (ZITHROMAX) tablet 250 mg     250 mg Oral  Once 06/09/16 0900 06/09/16 0918   06/08/16 1245  levofloxacin (LEVAQUIN) IVPB 750 mg  Status:  Discontinued     750 mg 100 mL/hr over 90 Minutes Intravenous Every 24 hours 06/08/16 1235 06/09/16 0900   06/08/16 1145  levofloxacin (LEVAQUIN) IVPB 750 mg     750 mg 100 mL/hr over 90 Minutes Intravenous  Once 06/08/16 1137 06/08/16 1410       Objective: Vitals:   06/10/16 0507 06/10/16 0810 06/10/16 0900 06/10/16 1148  BP: (!) 160/73  (!) 148/7   Pulse: (!) 118  (!) 113   Resp: 20  18   Temp: 98 F (36.7 C)   98.1 F (36.7 C)   TempSrc:   Oral   SpO2: 94% 98% 94% 95%  Weight:      Height:        Intake/Output Summary (Last 24 hours) at 06/10/16 1437 Last data filed at 06/10/16 0600  Gross per 24 hour  Intake             1300 ml  Output             1500 ml  Net             -200 ml   Filed Weights   06/08/16 0844 06/08/16 2106  Weight: 61.2 kg (135 lb) 61.3 kg (135 lb 2.3 oz)    Examination: General exam: Appears comfortable  HEENT: PERRLA, oral mucosa moist, no sclera icterus or thrush Respiratory system: mild scattered wheeze today. + cough,  RR in low 20s Cardiovascular system: S1 & S2 heard, RRR.  No murmurs - tachycardic  Gastrointestinal system: Abdomen soft, non-tender, nondistended. Normal bowel sound. No organomegaly Central nervous system: Alert and oriented. No focal neurological deficits. Extremities: No cyanosis, clubbing or edema Skin: No rashes or ulcers Psychiatry:  Mood & affect appropriate.     Data Reviewed: I have personally reviewed following labs and imaging studies  CBC:  Recent Labs Lab 06/08/16 0935 06/09/16 0516  WBC 11.9*  9.5  NEUTROABS 9.8*  --   HGB 14.4 12.8  HCT 46.1* 40.6  MCV 91.5 90.8  PLT 400 386   Basic Metabolic Panel:  Recent Labs Lab 06/08/16 0935 06/09/16 0516  NA 140 141  K 3.7 4.1  CL 102 99*  CO2 28 28  GLUCOSE 100* 118*  BUN 13 11  CREATININE 0.84 0.70  CALCIUM 9.3 9.2   GFR: Estimated Creatinine Clearance: 57.3 mL/min (by C-G formula based on SCr of 0.7 mg/dL). Liver Function Tests:  Recent Labs Lab 06/08/16 0935 06/09/16 0516  AST 18 17  ALT 17 15  ALKPHOS 53 47  BILITOT 0.5 0.2*  PROT 7.0 5.8*  ALBUMIN 3.7 3.2*   No results for input(s): LIPASE, AMYLASE in the last 168 hours. No results for input(s): AMMONIA in the last 168 hours. Coagulation Profile: No results for input(s): INR, PROTIME in the last 168 hours. Cardiac Enzymes:  Recent Labs Lab 06/08/16 0935  TROPONINI <0.03   BNP (last 3  results) No results for input(s): PROBNP in the last 8760 hours. HbA1C: No results for input(s): HGBA1C in the last 72 hours. CBG: No results for input(s): GLUCAP in the last 168 hours. Lipid Profile: No results for input(s): CHOL, HDL, LDLCALC, TRIG, CHOLHDL, LDLDIRECT in the last 72 hours. Thyroid Function Tests: No results for input(s): TSH, T4TOTAL, FREET4, T3FREE, THYROIDAB in the last 72 hours. Anemia Panel: No results for input(s): VITAMINB12, FOLATE, FERRITIN, TIBC, IRON, RETICCTPCT in the last 72 hours. Urine analysis:    Component Value Date/Time   COLORURINE YELLOW 09/17/2013 0057   APPEARANCEUR CLEAR 09/17/2013 0057   LABSPEC 1.021 09/17/2013 0057   PHURINE 6.5 09/17/2013 0057   GLUCOSEU NEGATIVE 09/17/2013 0057   HGBUR NEGATIVE 09/17/2013 0057   BILIRUBINUR NEGATIVE 09/17/2013 0057   KETONESUR NEGATIVE 09/17/2013 0057   PROTEINUR NEGATIVE 09/17/2013 0057   UROBILINOGEN 0.2 09/17/2013 0057   NITRITE NEGATIVE 09/17/2013 0057   LEUKOCYTESUR NEGATIVE 09/17/2013 0057   Sepsis Labs: @LABRCNTIP (procalcitonin:4,lacticidven:4) ) Recent Results (from the past 240 hour(s))  Culture, blood (routine x 2) Call MD if unable to obtain prior to antibiotics being given     Status: None (Preliminary result)   Collection Time: 06/08/16  1:48 PM  Result Value Ref Range Status   Specimen Description BLOOD LEFT ANTECUBITAL  Final   Special Requests   Final    BOTTLES DRAWN AEROBIC AND ANAEROBIC 10CC AER 5CC ANA   Culture NO GROWTH < 24 HOURS  Final   Report Status PENDING  Incomplete  Culture, blood (routine x 2) Call MD if unable to obtain prior to antibiotics being given     Status: None (Preliminary result)   Collection Time: 06/08/16  1:56 PM  Result Value Ref Range Status   Specimen Description BLOOD LEFT HAND  Final   Special Requests BOTTLES DRAWN AEROBIC ONLY 10CC  Final   Culture NO GROWTH < 24 HOURS  Final   Report Status PENDING  Incomplete         Radiology  Studies: Dg Chest 2 View  Result Date: 06/09/2016 CLINICAL DATA:  Respiratory failure, chest congestion, cough. History of COPD, current smoker EXAM: CHEST  2 VIEW COMPARISON:  PA and lateral chest x-ray of June 08, 2016 FINDINGS: The lungs remain hyperinflated with hemidiaphragm flattening. There is no alveolar infiltrate or pleural effusion. The heart and pulmonary vascularity are normal. The mediastinum is normal in width. There is calcification in the wall of the aortic arch. The bony thorax  exhibits no acute abnormality. IMPRESSION: COPD.  No pneumonia nor CHF. Aortic atherosclerosis. Electronically Signed   By: David  [place] M.D.   On: 06/09/2016 07:49      Scheduled Meds: . amLODipine  5 mg Oral Daily  . aspirin  81 mg Oral Daily  . dextromethorphan-guaiFENesin  1 tablet Oral BID  . enoxaparin (LOVENOX) injection  40 mg Subcutaneous Q24H  . ipratropium-albuterol  3 mL Nebulization QID  . methylPREDNISolone (SOLU-MEDROL) injection  60 mg Intravenous Q12H   Continuous Infusions:    LOS: 1 day    Time spent in minutes: 35    Jamey Harman, MD [facility] Pager: [email or web address] Password [email or web address] 06/10/2016, 2:37 PM

## 2016-06-11 LAB — D-DIMER, QUANTITATIVE: D-Dimer, Quant: 0.33 ug/mL-FEU (ref 0.00–0.50)

## 2016-06-11 NOTE — Progress Notes (Signed)
Ambulated patient about 12 feet.  Saturation remained steady at 93% on room air. HR went up to 130's while hile walking. Patient gets winded easily. Required four stops to finish walk.

## 2016-06-11 NOTE — Progress Notes (Signed)
Patient ambulated,barely out of the room ,she complained of too  tired to continue.Back in bed ,sitting up with a  coughing spell.Room air oxygen saturation while walking was 97%,however her heart rate shoot up to 130/minutes while she was walking.

## 2016-06-11 NOTE — Progress Notes (Signed)
PROGRESS NOTE    Katie Waller  RUE:[id number] DOB: [birth year] DOA: 06/08/2016  PCP: Lolita Patella, MD   Brief Narrative:  Katie Waller is a 69 y.o. female with medical history significant for COPD/ chronic bronchitis, tobacco abuse, HTN, atherosclerosis, Grade 1 dCHF EF 67-70%, presenting to the ED with 1 week  history of increasing dyspnea and a cough which did not improve with Prednisone or a Z pak.  Subjective: Less short of breath today. Cough has nearly resolved. No longer congested. Able to ambulate to the door today but not further.   Assessment & Plan:   Active Problems:   COPD exacerbation/  Acute respiratory failure - cont Steroids at current dose, Nebs, cough medications - O2 has been weaned off - 96-97 % on room air bat rest and with exertion (checked today) - Flutter valve - CXR initially suspicious of LUL opacity suggestive of pneumonia however repeat CXR PA/lat doe not show this - stopped Levaquin- completed Zpak  - improving but slowly    Smoker - 1/2 ppd - have discussed cessation  - she feels that she does not need a nicotine patch    Essential hypertension - Amlodipine  Chronic dCHF - grade 1 - compensated  Pedal edema -per patient and daughter edema gets worse as day goes on - possible right heart failure related to COPD exacerbation and venous stasis    DVT prophylaxis: Lovenox Code Status: Full code Family Communication: daughter Disposition Plan: home when stable Consultants:    Procedures:    Antimicrobials:  Anti-infectives    Start     Dose/Rate Route Frequency Ordered Stop   06/09/16 0930  azithromycin (ZITHROMAX) tablet 250 mg     250 mg Oral  Once 06/09/16 0900 06/09/16 0918   06/08/16 1245  levofloxacin (LEVAQUIN) IVPB 750 mg  Status:  Discontinued     750 mg 100 mL/hr over 90 Minutes Intravenous Every 24 hours 06/08/16 1235 06/09/16 0900   06/08/16 1145  levofloxacin (LEVAQUIN) IVPB 750 mg     750 mg 100  mL/hr over 90 Minutes Intravenous  Once 06/08/16 1137 06/08/16 1410       Objective: Vitals:   06/11/16 0556 06/11/16 0909 06/11/16 0924 06/11/16 1150  BP: 133/75  134/70   Pulse: 100  (!) 124   Resp: 18  (!) 22   Temp: 98.5 F (36.9 C)  98.3 F (36.8 C)   TempSrc: Oral  Oral   SpO2: 96% 96% 97% 99%  Weight:      Height:        Intake/Output Summary (Last 24 hours) at 06/11/16 1424 Last data filed at 06/11/16 1023  Gross per 24 hour  Intake              480 ml  Output             1100 ml  Net             -620 ml   Filed Weights   06/08/16 0844 06/08/16 2106  Weight: 61.2 kg (135 lb) 61.3 kg (135 lb 2.3 oz)    Examination: General exam: Appears comfortable  HEENT: PERRLA, oral mucosa moist, no sclera icterus or thrush Respiratory system: CTA with poor air entry. + cough,  RR18 Cardiovascular system: S1 & S2 heard, RRR.  No murmurs - tachycardic  Gastrointestinal system: Abdomen soft, non-tender, nondistended. Normal bowel sound. No organomegaly Central nervous system: Alert and oriented. No focal neurological deficits. Extremities: No cyanosis, clubbing  or edema Skin: No rashes or ulcers Psychiatry:  Mood & affect appropriate.     Data Reviewed: I have personally reviewed following labs and imaging studies  CBC:  Recent Labs Lab 06/08/16 0935 06/09/16 0516  WBC 11.9* 9.5  NEUTROABS 9.8*  --   HGB 14.4 12.8  HCT 46.1* 40.6  MCV 91.5 90.8  PLT 400 386   Basic Metabolic Panel:  Recent Labs Lab 06/08/16 0935 06/09/16 0516  NA 140 141  K 3.7 4.1  CL 102 99*  CO2 28 28  GLUCOSE 100* 118*  BUN 13 11  CREATININE 0.84 0.70  CALCIUM 9.3 9.2   GFR: Estimated Creatinine Clearance: 57.3 mL/min (by C-G formula based on SCr of 0.7 mg/dL). Liver Function Tests:  Recent Labs Lab 06/08/16 0935 06/09/16 0516  AST 18 17  ALT 17 15  ALKPHOS 53 47  BILITOT 0.5 0.2*  PROT 7.0 5.8*  ALBUMIN 3.7 3.2*   No results for input(s): LIPASE, AMYLASE in the  last 168 hours. No results for input(s): AMMONIA in the last 168 hours. Coagulation Profile: No results for input(s): INR, PROTIME in the last 168 hours. Cardiac Enzymes:  Recent Labs Lab 06/08/16 0935  TROPONINI <0.03   BNP (last 3 results) No results for input(s): PROBNP in the last 8760 hours. HbA1C: No results for input(s): HGBA1C in the last 72 hours. CBG: No results for input(s): GLUCAP in the last 168 hours. Lipid Profile: No results for input(s): CHOL, HDL, LDLCALC, TRIG, CHOLHDL, LDLDIRECT in the last 72 hours. Thyroid Function Tests: No results for input(s): TSH, T4TOTAL, FREET4, T3FREE, THYROIDAB in the last 72 hours. Anemia Panel: No results for input(s): VITAMINB12, FOLATE, FERRITIN, TIBC, IRON, RETICCTPCT in the last 72 hours. Urine analysis:    Component Value Date/Time   COLORURINE YELLOW 09/17/2013 0057   APPEARANCEUR CLEAR 09/17/2013 0057   LABSPEC 1.021 09/17/2013 0057   PHURINE 6.5 09/17/2013 0057   GLUCOSEU NEGATIVE 09/17/2013 0057   HGBUR NEGATIVE 09/17/2013 0057   BILIRUBINUR NEGATIVE 09/17/2013 0057   KETONESUR NEGATIVE 09/17/2013 0057   PROTEINUR NEGATIVE 09/17/2013 0057   UROBILINOGEN 0.2 09/17/2013 0057   NITRITE NEGATIVE 09/17/2013 0057   LEUKOCYTESUR NEGATIVE 09/17/2013 0057   Sepsis Labs: @LABRCNTIP (procalcitonin:4,lacticidven:4) ) Recent Results (from the past 240 hour(s))  Culture, blood (routine x 2) Call MD if unable to obtain prior to antibiotics being given     Status: None (Preliminary result)   Collection Time: 06/08/16  1:48 PM  Result Value Ref Range Status   Specimen Description BLOOD LEFT ANTECUBITAL  Final   Special Requests   Final    BOTTLES DRAWN AEROBIC AND ANAEROBIC 10CC AER 5CC ANA   Culture NO GROWTH 2 DAYS  Final   Report Status PENDING  Incomplete  Culture, blood (routine x 2) Call MD if unable to obtain prior to antibiotics being given     Status: None (Preliminary result)   Collection Time: 06/08/16  1:56 PM    Result Value Ref Range Status   Specimen Description BLOOD LEFT HAND  Final   Special Requests BOTTLES DRAWN AEROBIC ONLY 10CC  Final   Culture NO GROWTH 2 DAYS  Final   Report Status PENDING  Incomplete         Radiology Studies: No results found.    Scheduled Meds: . amLODipine  5 mg Oral Daily  . aspirin  81 mg Oral Daily  . dextromethorphan-guaiFENesin  1 tablet Oral BID  . enoxaparin (LOVENOX) injection  40 mg  Subcutaneous Q24H  . ipratropium-albuterol  3 mL Nebulization QID  . methylPREDNISolone (SOLU-MEDROL) injection  60 mg Intravenous Q12H   Continuous Infusions:    LOS: 2 days    Time spent in minutes: 35    Corbett Moulder, MD [facility] Pager: [email or web address] Password [email or web address] 06/11/2016, 2:24 PM

## 2016-06-11 NOTE — Progress Notes (Signed)
Patient ambulated for the third time today.She was able to ambulated half way through the nursing station then suddenly complained of being very tired.Moderate shortness of breathing noted,but patient able to keep her room air saturation at 90%,however her heart rate still at 130's per minutes.Returned back to her room,encouraged her to sit-up in the chair while grasping for her breathing.Will monitor.

## 2016-06-11 NOTE — Progress Notes (Signed)
Attempt to ambulate pt down the hall this am to check O2 saturation with ambulation, but pt unable to tolerate. Pt. auditory wheezes could be heard without stethoscope and had to return to the room for stabilization. Report off to oncoming shift for continued monitoring. Dondra SpryMoore, Tonesha Tsou Islee, RN

## 2016-06-12 DIAGNOSIS — R600 Localized edema: Secondary | ICD-10-CM

## 2016-06-12 LAB — PROCALCITONIN

## 2016-06-12 MED ORDER — METHYLPREDNISOLONE SODIUM SUCC 125 MG IJ SOLR
60.0000 mg | Freq: Every day | INTRAMUSCULAR | Status: DC
  Administered 2016-06-13: 60 mg via INTRAVENOUS
  Filled 2016-06-12: qty 2

## 2016-06-12 NOTE — Progress Notes (Addendum)
PROGRESS NOTE    Katie Waller  ZOX:[id number] DOB: [birth year] DOA: 06/08/2016  PCP: Lolita Patella, MD   Brief Narrative:  Katie Waller is a 70 y.o. female with medical history significant for COPD/ chronic bronchitis, tobacco abuse, HTN, atherosclerosis, Grade 1 dCHF EF 67-70%, presenting to the ED with 1 week  history of increasing dyspnea and a cough which did not improve with Prednisone or a Z pak.  Subjective: Dyspnea on exertion improving but gets very short of breath even walking to the door. Cough has nearly resolved.    Assessment & Plan:   Active Problems:   COPD exacerbation/  Acute respiratory failure - d dimer negative  - slow to improve - wean steroids- cont Nebs, cough medications - O2 has been weaned off - 96-97 % on room air bat rest and with exertion (checked today) - Flutter valve - CXR initially suspicious of LUL opacity suggestive of pneumonia however repeat CXR PA/lat does not show this - stopped Levaquin- completed Zpak       Smoker - 1/2 ppd - have discussed cessation  - she feels that she does not need a nicotine patch    Essential hypertension - Amlodipine  Chronic dCHF - grade 1 - compensated  Pedal edema -per patient and daughter edema gets worse as day goes on - possible right heart failure related to COPD exacerbation along with venous stasis - TEDS  DVT prophylaxis: Lovenox Code Status: Full code Family Communication: daughter Disposition Plan: home when stable Consultants:    Procedures:    Antimicrobials:  Anti-infectives    Start     Dose/Rate Route Frequency Ordered Stop   06/09/16 0930  azithromycin (ZITHROMAX) tablet 250 mg     250 mg Oral  Once 06/09/16 0900 06/09/16 0918   06/08/16 1245  levofloxacin (LEVAQUIN) IVPB 750 mg  Status:  Discontinued     750 mg 100 mL/hr over 90 Minutes Intravenous Every 24 hours 06/08/16 1235 06/09/16 0900   06/08/16 1145  levofloxacin (LEVAQUIN) IVPB 750 mg     750  mg 100 mL/hr over 90 Minutes Intravenous  Once 06/08/16 1137 06/08/16 1410       Objective: Vitals:   06/12/16 0437 06/12/16 0852 06/12/16 0910 06/12/16 1225  BP: (!) 146/71  (!) 155/65   Pulse: 93  (!) 114 (!) 114  Resp: 17  18 20   Temp: 98.5 F (36.9 C)  98.2 F (36.8 C)   TempSrc: Oral  Oral   SpO2: 95% 92% 94% 94%  Weight:      Height:        Intake/Output Summary (Last 24 hours) at 06/12/16 1340 Last data filed at 06/12/16 1029  Gross per 24 hour  Intake             1080 ml  Output             1750 ml  Net             -670 ml   Filed Weights   06/08/16 0844 06/08/16 2106  Weight: 61.2 kg (135 lb) 61.3 kg (135 lb 2.3 oz)    Examination: General exam: Appears comfortable  HEENT: PERRLA, oral mucosa moist, no sclera icterus or thrush Respiratory system: CTA with poor air entry.     Cardiovascular system: S1 & S2 heard, RRR.  No murmurs   Gastrointestinal system: Abdomen soft, non-tender, nondistended. Normal bowel sound. No organomegaly Central nervous system: Alert and oriented. No focal neurological deficits.  Extremities: No cyanosis, clubbing or edema Skin: No rashes or ulcers Psychiatry:  Mood & affect appropriate.     Data Reviewed: I have personally reviewed following labs and imaging studies  CBC:  Recent Labs Lab 06/08/16 0935 06/09/16 0516  WBC 11.9* 9.5  NEUTROABS 9.8*  --   HGB 14.4 12.8  HCT 46.1* 40.6  MCV 91.5 90.8  PLT 400 386   Basic Metabolic Panel:  Recent Labs Lab 06/08/16 0935 06/09/16 0516  NA 140 141  K 3.7 4.1  CL 102 99*  CO2 28 28  GLUCOSE 100* 118*  BUN 13 11  CREATININE 0.84 0.70  CALCIUM 9.3 9.2   GFR: Estimated Creatinine Clearance: 57.3 mL/min (by C-G formula based on SCr of 0.7 mg/dL). Liver Function Tests:  Recent Labs Lab 06/08/16 0935 06/09/16 0516  AST 18 17  ALT 17 15  ALKPHOS 53 47  BILITOT 0.5 0.2*  PROT 7.0 5.8*  ALBUMIN 3.7 3.2*   No results for input(s): LIPASE, AMYLASE in the last  168 hours. No results for input(s): AMMONIA in the last 168 hours. Coagulation Profile: No results for input(s): INR, PROTIME in the last 168 hours. Cardiac Enzymes:  Recent Labs Lab 06/08/16 0935  TROPONINI <0.03   BNP (last 3 results) No results for input(s): PROBNP in the last 8760 hours. HbA1C: No results for input(s): HGBA1C in the last 72 hours. CBG: No results for input(s): GLUCAP in the last 168 hours. Lipid Profile: No results for input(s): CHOL, HDL, LDLCALC, TRIG, CHOLHDL, LDLDIRECT in the last 72 hours. Thyroid Function Tests: No results for input(s): TSH, T4TOTAL, FREET4, T3FREE, THYROIDAB in the last 72 hours. Anemia Panel: No results for input(s): VITAMINB12, FOLATE, FERRITIN, TIBC, IRON, RETICCTPCT in the last 72 hours. Urine analysis:    Component Value Date/Time   COLORURINE YELLOW 09/17/2013 0057   APPEARANCEUR CLEAR 09/17/2013 0057   LABSPEC 1.021 09/17/2013 0057   PHURINE 6.5 09/17/2013 0057   GLUCOSEU NEGATIVE 09/17/2013 0057   HGBUR NEGATIVE 09/17/2013 0057   BILIRUBINUR NEGATIVE 09/17/2013 0057   KETONESUR NEGATIVE 09/17/2013 0057   PROTEINUR NEGATIVE 09/17/2013 0057   UROBILINOGEN 0.2 09/17/2013 0057   NITRITE NEGATIVE 09/17/2013 0057   LEUKOCYTESUR NEGATIVE 09/17/2013 0057   Sepsis Labs: @LABRCNTIP (procalcitonin:4,lacticidven:4) ) Recent Results (from the past 240 hour(s))  Culture, blood (routine x 2) Call MD if unable to obtain prior to antibiotics being given     Status: None (Preliminary result)   Collection Time: 06/08/16  1:48 PM  Result Value Ref Range Status   Specimen Description BLOOD LEFT ANTECUBITAL  Final   Special Requests   Final    BOTTLES DRAWN AEROBIC AND ANAEROBIC 10CC AER 5CC ANA   Culture NO GROWTH 3 DAYS  Final   Report Status PENDING  Incomplete  Culture, blood (routine x 2) Call MD if unable to obtain prior to antibiotics being given     Status: None (Preliminary result)   Collection Time: 06/08/16  1:56 PM  Result  Value Ref Range Status   Specimen Description BLOOD LEFT HAND  Final   Special Requests BOTTLES DRAWN AEROBIC ONLY 10CC  Final   Culture NO GROWTH 3 DAYS  Final   Report Status PENDING  Incomplete         Radiology Studies: No results found.    Scheduled Meds: . amLODipine  5 mg Oral Daily  . aspirin  81 mg Oral Daily  . dextromethorphan-guaiFENesin  1 tablet Oral BID  . enoxaparin (LOVENOX) injection  40 mg Subcutaneous Q24H  . ipratropium-albuterol  3 mL Nebulization QID  . methylPREDNISolone (SOLU-MEDROL) injection  60 mg Intravenous Q12H   Continuous Infusions:    LOS: 3 days    Time spent in minutes: 35    Blenda Wisecup, MD [facility] Pager: [email or web address] Password [email or web address] 06/12/2016, 1:40 PM

## 2016-06-13 ENCOUNTER — Inpatient Hospital Stay (HOSPITAL_COMMUNITY): Payer: Commercial Managed Care - HMO

## 2016-06-13 DIAGNOSIS — J9600 Acute respiratory failure, unspecified whether with hypoxia or hypercapnia: Secondary | ICD-10-CM

## 2016-06-13 DIAGNOSIS — I878 Other specified disorders of veins: Secondary | ICD-10-CM

## 2016-06-13 HISTORY — DX: Other specified disorders of veins: I87.8

## 2016-06-13 LAB — ECHOCARDIOGRAM COMPLETE
AVLVOTPG: 5 mmHg
CHL CUP TV REG PEAK VELOCITY: 122 cm/s
E decel time: 125 msec
EERAT: 8.14
FS: 29 % (ref 28–44)
HEIGHTINCHES: 61 in
IVS/LV PW RATIO, ED: 1.16
LA ID, A-P, ES: 26 mm
LA diam end sys: 26 mm
LA vol A4C: 13.6 ml
LA vol index: 12.1 mL/m2
LADIAMINDEX: 1.63 cm/m2
LAVOL: 19.4 mL
LV PW d: 9.69 mm — AB (ref 0.6–1.1)
LV TDI E'LATERAL: 8.27
LV e' LATERAL: 8.27 cm/s
LVEEAVG: 8.14
LVEEMED: 8.14
LVOT SV: 60 mL
LVOT VTI: 19.1 cm
LVOT area: 3.14 cm2
LVOT diameter: 20 mm
LVOTPV: 112 cm/s
MV Dec: 125
MV pk A vel: 91.3 m/s
MVPKEVEL: 67.3 m/s
TDI e' medial: 5.55
TR max vel: 122 cm/s
WEIGHTICAEL: 2162.27 [oz_av]

## 2016-06-13 LAB — CULTURE, BLOOD (ROUTINE X 2)
CULTURE: NO GROWTH
Culture: NO GROWTH

## 2016-06-13 MED ORDER — PREDNISONE 10 MG PO TABS: 60.0000 mg | ORAL_TABLET | Freq: Every day | ORAL | 0 refills | Status: DC

## 2016-06-13 MED ORDER — BENZONATATE 200 MG PO CAPS: 200.0000 mg | ORAL_CAPSULE | Freq: Three times a day (TID) | ORAL | 0 refills | Status: DC | PRN

## 2016-06-13 MED ORDER — T.E.D. BELOW KNEE/LARGE MISC: 1.0000 | Freq: Every day | 0 refills | Status: DC

## 2016-06-13 MED ORDER — DM-GUAIFENESIN ER 30-600 MG PO TB12: 1.0000 | ORAL_TABLET | Freq: Two times a day (BID) | ORAL | 0 refills | Status: DC

## 2016-06-13 NOTE — Progress Notes (Signed)
Patient discharged to home with family member. All discharge instructions reviewed along with scripts. IV removed. Patient dressed with all belongings. Patient left the unit in stable condition in wheelchair.  Avelina LaineKimberly Tiye Huwe RN

## 2016-06-13 NOTE — Care Management Important Message (Signed)
Important Message  Patient Details  Name: Katie DingwallVelma L Caffey MRN: [id number] Date of Birth: [birth year]   Medicare Important Message Given:  Yes    Kaydon Creedon Stefan ChurchBratton 06/13/2016, 12:18 PM

## 2016-06-13 NOTE — Discharge Instructions (Signed)

## 2016-06-13 NOTE — Progress Notes (Signed)
Patient at rest on room air 90% Patient ambulating on room air 81% Patient on 2LNC 96%

## 2016-06-13 NOTE — Progress Notes (Signed)
  Echocardiogram 2D Echocardiogram has been performed.  Leta JunglingCooper, Alphonsus Doyel M 06/13/2016, 10:05 AM

## 2016-06-13 NOTE — Discharge Summary (Addendum)
Physician Discharge Summary  Katie Waller ZOX:[id number] DOB: April 26, 1946 DOA: 06/08/2016  PCP: Lolita Patella, MD  Admit date: 06/08/2016 Discharge date: 06/13/2016  Admitted From: home  Disposition:  home   Recommendations for Outpatient Follow-up:  1. Continue to encourage smoking cessation  Home Health:  none  Equipment/Devices:  none    Discharge Condition:  stable   CODE STATUS:  Full code   Diet recommendation:  Low sodium, heart healthy Consultations:  none    Discharge Diagnoses:  Principal Problem:   COPD exacerbation (HCC) Active Problems:   Acute respiratory failure (HCC)   Smoker   Essential hypertension   Venous stasis of both lower extremities    Subjective: Feels less dyspneic with walking. No cough.  Brief Summary: Katie L Burnetteis a 70 y.o.femalewith medical history significant for COPD/ chronic bronchitis, tobacco abuse, HTN, atherosclerosis, Grade 1 dCHF EF 65-70%, presenting to the ED with 1 week history of increasing dyspnea and a cough which did not improve with Prednisone and a Z pak.  Hospital Course:  Active Problems:   COPD exacerbation - symptoms of dyspnea slow to improve but  O2 weaned off on day 2- 96-97 % on room air at rest and with exertion - able to walk > 500 feet today - Prednisone taper - cont Nebs, cough medications - Flutter valve - CXR initially suspicious of LUL opacity suggestive of pneumonia however repeat CXR PA/lat does not show this - stopped Levaquin- completed Zpak  - smoking cessation discussed     Acute respiratory failure - due to above - d dimer negative - ECHO shows only Grade 1 diastolic dysfunction- no pulm edema on CXR, no JVD or crackles on exam    Smoker - 1/2 ppd - have discussed cessation  - she feels that she does not need a nicotine patch    Essential hypertension - Amlodipine  Chronic dCHF - grade 1 - compensated  Pedal edema -per patient and daughter edema gets worse  as day goes on - likely venous stasis - TEDS  Discharge Instructions  Discharge Instructions    Diet - low sodium heart healthy    Complete by:  As directed    Increase activity slowly    Complete by:  As directed        Medication List    STOP taking these medications   azithromycin 250 MG tablet Commonly known as:  ZITHROMAX     TAKE these medications   albuterol 108 (90 Base) MCG/ACT inhaler Commonly known as:  PROVENTIL HFA;VENTOLIN HFA Inhale 2 puffs into the lungs every 6 (six) hours as needed for wheezing or shortness of breath.   amLODipine 5 MG tablet Commonly known as:  NORVASC Take 5 mg by mouth daily.   aspirin 81 MG chewable tablet Chew 1 tablet (81 mg total) by mouth daily.   benzonatate 200 MG capsule Commonly known as:  TESSALON Take 1 capsule (200 mg total) by mouth 3 (three) times daily as needed for cough.   dextromethorphan-guaiFENesin 30-600 MG 12hr tablet Commonly known as:  MUCINEX DM Take 1 tablet by mouth 2 (two) times daily.   predniSONE 10 MG tablet Commonly known as:  DELTASONE Take 6 tablets (60 mg total) by mouth daily with breakfast. Take 60 mg by mouth daily for  Then 40 mg by mouth for 3  Days Then 20 mg x 3 days Then 10 mg x 3 days Then 5 mg x 3 days What changed:  medication strength  how much to take  when to take this  additional instructions   raloxifene 60 MG tablet Commonly known as:  EVISTA Take 60 mg by mouth every morning.   SYMBICORT 160-4.5 MCG/ACT inhaler Generic drug:  budesonide-formoterol Inhale 2 puffs into the lungs 2 (two) times daily.   T.E.D. BELOW KNEE/LARGE Misc 1 each by Does not apply route daily. On in AM and off in PM Please measure patient and give appropriate size.       Allergies  Allergen Reactions  . Ace Inhibitors Swelling  . Codeine Nausea And Vomiting     Procedures/Studies: 2 D ECHO 10/9 Left ventricle: The cavity size was normal. There was mild   concentric hypertrophy.  Systolic function was vigorous. The   estimated ejection fraction was in the range of 65% to 70%. Wall   motion was normal; there were no regional wall motion   abnormalities. Doppler parameters are consistent with abnormal   left ventricular relaxation (grade 1 diastolic dysfunction).   There was no evidence of elevated ventricular filling pressure by   Doppler parameters. - Aortic valve: Trileaflet; normal thickness leaflets. - Aortic root: The aortic root was normal in size. - Mitral valve: Structurally normal valve. There was no   regurgitation. - Right ventricle: The cavity size was normal. Wall thickness was   normal. Systolic function was normal. - Tricuspid valve: There was trivial regurgitation. - Pulmonary arteries: Systolic pressure was within the normal   range. - Inferior vena cava: The vessel was normal in size. - Pericardium, extracardiac: There was no pericardial effusion.  Dg Chest 2 View  Result Date: 06/09/2016 CLINICAL DATA:  Respiratory failure, chest congestion, cough. History of COPD, current smoker EXAM: CHEST  2 VIEW COMPARISON:  PA and lateral chest x-ray of June 08, 2016 FINDINGS: The lungs remain hyperinflated with hemidiaphragm flattening. There is no alveolar infiltrate or pleural effusion. The heart and pulmonary vascularity are normal. The mediastinum is normal in width. There is calcification in the wall of the aortic arch. The bony thorax exhibits no acute abnormality. IMPRESSION: COPD.  No pneumonia nor CHF. Aortic atherosclerosis. Electronically Signed   By: David  [place] M.D.   On: 06/09/2016 07:49   Dg Chest 2 View  Result Date: 06/08/2016 CLINICAL DATA:  Shortness of breath and chest pressure.  Wheezing. EXAM: CHEST  2 VIEW COMPARISON:  12/10/2014. FINDINGS: Trachea is midline. Heart size normal. Thoracic aorta is calcified. Lungs are hyperinflated. A small rounded area of airspace opacification is seen in the peripheral left upper lobe and appears  new. Lungs are otherwise clear. No pleural fluid. IMPRESSION: 1. Rounded airspace opacity in the left upper lobe may be infectious or inflammatory in etiology. Malignancy can also have this appearance. Followup PA and lateral chest X-ray is recommended in 3-4 weeks following trial of antibiotic therapy to ensure resolution and exclude underlying malignancy. 2. Aortic atherosclerosis. Electronically Signed   By: Leanna Battles M.D.   On: 06/08/2016 10:09       Discharge Exam: Vitals:   06/13/16 1000 06/13/16 1005  BP:    Pulse: (!) 129 (!) 111  Resp:    Temp:     Vitals:   06/13/16 0607 06/13/16 0955 06/13/16 1000 06/13/16 1005  BP: (!) 161/76 (!) 145/81    Pulse: (!) 102 (!) 103 (!) 129 (!) 111  Resp: 18 18    Temp: 98.7 F (37.1 C) 98.5 F (36.9 C)    TempSrc: Oral Oral    SpO2:  94% 90% (!) 81% 96%  Weight:      Height:        General: Pt is alert, awake, not in acute distress Cardiovascular: RRR, S1/S2 +, no rubs, no gallops Respiratory: CTA bilaterally, no wheezing, no rhonchi Abdominal: Soft, NT, ND, bowel sounds + Extremities: no edema, no cyanosis    The results of significant diagnostics from this hospitalization (including imaging, microbiology, ancillary and laboratory) are listed below for reference.     Microbiology: Recent Results (from the past 240 hour(s))  Culture, blood (routine x 2) Call MD if unable to obtain prior to antibiotics being given     Status: None   Collection Time: 06/08/16  1:48 PM  Result Value Ref Range Status   Specimen Description BLOOD LEFT ANTECUBITAL  Final   Special Requests   Final    BOTTLES DRAWN AEROBIC AND ANAEROBIC 10CC AER 5CC ANA   Culture NO GROWTH 5 DAYS  Final   Report Status 06/13/2016 FINAL  Final  Culture, blood (routine x 2) Call MD if unable to obtain prior to antibiotics being given     Status: None   Collection Time: 06/08/16  1:56 PM  Result Value Ref Range Status   Specimen Description BLOOD LEFT HAND   Final   Special Requests BOTTLES DRAWN AEROBIC ONLY 10CC  Final   Culture NO GROWTH 5 DAYS  Final   Report Status 06/13/2016 FINAL  Final     Labs: BNP (last 3 results) No results for input(s): BNP in the last 8760 hours. Basic Metabolic Panel:  Recent Labs Lab 06/08/16 0935 06/09/16 0516  NA 140 141  K 3.7 4.1  CL 102 99*  CO2 28 28  GLUCOSE 100* 118*  BUN 13 11  CREATININE 0.84 0.70  CALCIUM 9.3 9.2   Liver Function Tests:  Recent Labs Lab 06/08/16 0935 06/09/16 0516  AST 18 17  ALT 17 15  ALKPHOS 53 47  BILITOT 0.5 0.2*  PROT 7.0 5.8*  ALBUMIN 3.7 3.2*   No results for input(s): LIPASE, AMYLASE in the last 168 hours. No results for input(s): AMMONIA in the last 168 hours. CBC:  Recent Labs Lab 06/08/16 0935 06/09/16 0516  WBC 11.9* 9.5  NEUTROABS 9.8*  --   HGB 14.4 12.8  HCT 46.1* 40.6  MCV 91.5 90.8  PLT 400 386   Cardiac Enzymes:  Recent Labs Lab 06/08/16 0935  TROPONINI <0.03   BNP: Invalid input(s): POCBNP CBG: No results for input(s): GLUCAP in the last 168 hours. D-Dimer  Recent Labs  06/11/16 1427  DDIMER 0.33   Hgb A1c No results for input(s): HGBA1C in the last 72 hours. Lipid Profile No results for input(s): CHOL, HDL, LDLCALC, TRIG, CHOLHDL, LDLDIRECT in the last 72 hours. Thyroid function studies No results for input(s): TSH, T4TOTAL, T3FREE, THYROIDAB in the last 72 hours.  Invalid input(s): FREET3 Anemia work up No results for input(s): VITAMINB12, FOLATE, FERRITIN, TIBC, IRON, RETICCTPCT in the last 72 hours. Urinalysis    Component Value Date/Time   COLORURINE YELLOW 09/17/2013 0057   APPEARANCEUR CLEAR 09/17/2013 0057   LABSPEC 1.021 09/17/2013 0057   PHURINE 6.5 09/17/2013 0057   GLUCOSEU NEGATIVE 09/17/2013 0057   HGBUR NEGATIVE 09/17/2013 0057   BILIRUBINUR NEGATIVE 09/17/2013 0057   KETONESUR NEGATIVE 09/17/2013 0057   PROTEINUR NEGATIVE 09/17/2013 0057   UROBILINOGEN 0.2 09/17/2013 0057   NITRITE  NEGATIVE 09/17/2013 0057   LEUKOCYTESUR NEGATIVE 09/17/2013 0057   Sepsis Labs Invalid input(s): PROCALCITONIN,  WBC,  LACTICIDVEN Microbiology Recent Results (from the past 240 hour(s))  Culture, blood (routine x 2) Call MD if unable to obtain prior to antibiotics being given     Status: None   Collection Time: 06/08/16  1:48 PM  Result Value Ref Range Status   Specimen Description BLOOD LEFT ANTECUBITAL  Final   Special Requests   Final    BOTTLES DRAWN AEROBIC AND ANAEROBIC 10CC AER 5CC ANA   Culture NO GROWTH 5 DAYS  Final   Report Status 06/13/2016 FINAL  Final  Culture, blood (routine x 2) Call MD if unable to obtain prior to antibiotics being given     Status: None   Collection Time: 06/08/16  1:56 PM  Result Value Ref Range Status   Specimen Description BLOOD LEFT HAND  Final   Special Requests BOTTLES DRAWN AEROBIC ONLY 10CC  Final   Culture NO GROWTH 5 DAYS  Final   Report Status 06/13/2016 FINAL  Final     Time coordinating discharge: Over 30 minutes  SIGNED:   Calvert CantorIZWAN,Orla Estrin, MD  [facility] 06/13/2016, 5:07 PM Pager   If 7PM-7AM, please contact night-coverage [email or web address] Password [email or web address]

## 2016-06-14 NOTE — Consult Note (Signed)
            [facility] Primary [facility] Navigator  06/14/2016  [name] 04-Sep-1946 [id number]  Went to see patient at the bedside to identify possible discharge needs but she was discharged yesterday.  Primary care provider's office called. Spoke with Rica KoyanagiBonnie M. and was notified of patient's discharge and need to follow-up for transition of care.  For additional questions please contact:  Karin GoldenLorraine A. Jie Stickels, BSN, RN-BC [facility] PRIMARY C[facility] Navigator Cell: [phone]

## 2016-06-15 DIAGNOSIS — J449 Chronic obstructive pulmonary disease, unspecified: Secondary | ICD-10-CM | POA: Diagnosis not present

## 2016-06-15 DIAGNOSIS — J9600 Acute respiratory failure, unspecified whether with hypoxia or hypercapnia: Secondary | ICD-10-CM | POA: Diagnosis not present

## 2016-06-15 DIAGNOSIS — R0902 Hypoxemia: Secondary | ICD-10-CM | POA: Diagnosis not present

## 2016-06-24 DIAGNOSIS — I10 Essential (primary) hypertension: Secondary | ICD-10-CM | POA: Diagnosis not present

## 2016-06-24 DIAGNOSIS — Z23 Encounter for immunization: Secondary | ICD-10-CM | POA: Diagnosis not present

## 2016-06-24 DIAGNOSIS — J449 Chronic obstructive pulmonary disease, unspecified: Secondary | ICD-10-CM | POA: Diagnosis not present

## 2016-07-16 DIAGNOSIS — J9600 Acute respiratory failure, unspecified whether with hypoxia or hypercapnia: Secondary | ICD-10-CM | POA: Diagnosis not present

## 2016-07-16 DIAGNOSIS — R0902 Hypoxemia: Secondary | ICD-10-CM | POA: Diagnosis not present

## 2016-07-16 DIAGNOSIS — J449 Chronic obstructive pulmonary disease, unspecified: Secondary | ICD-10-CM | POA: Diagnosis not present

## 2016-08-15 DIAGNOSIS — R0902 Hypoxemia: Secondary | ICD-10-CM | POA: Diagnosis not present

## 2016-08-15 DIAGNOSIS — J9600 Acute respiratory failure, unspecified whether with hypoxia or hypercapnia: Secondary | ICD-10-CM | POA: Diagnosis not present

## 2016-08-15 DIAGNOSIS — J449 Chronic obstructive pulmonary disease, unspecified: Secondary | ICD-10-CM | POA: Diagnosis not present

## 2016-09-15 DIAGNOSIS — J449 Chronic obstructive pulmonary disease, unspecified: Secondary | ICD-10-CM | POA: Diagnosis not present

## 2016-09-15 DIAGNOSIS — R0902 Hypoxemia: Secondary | ICD-10-CM | POA: Diagnosis not present

## 2016-09-15 DIAGNOSIS — J9600 Acute respiratory failure, unspecified whether with hypoxia or hypercapnia: Secondary | ICD-10-CM | POA: Diagnosis not present

## 2016-10-16 DIAGNOSIS — J9600 Acute respiratory failure, unspecified whether with hypoxia or hypercapnia: Secondary | ICD-10-CM | POA: Diagnosis not present

## 2016-10-16 DIAGNOSIS — R0902 Hypoxemia: Secondary | ICD-10-CM | POA: Diagnosis not present

## 2016-10-16 DIAGNOSIS — J449 Chronic obstructive pulmonary disease, unspecified: Secondary | ICD-10-CM | POA: Diagnosis not present

## 2016-10-26 DIAGNOSIS — I10 Essential (primary) hypertension: Secondary | ICD-10-CM | POA: Diagnosis not present

## 2016-10-26 DIAGNOSIS — I7300 Raynaud's syndrome without gangrene: Secondary | ICD-10-CM | POA: Diagnosis not present

## 2016-10-26 DIAGNOSIS — M79602 Pain in left arm: Secondary | ICD-10-CM | POA: Diagnosis not present

## 2017-01-13 ENCOUNTER — Ambulatory Visit
Admission: RE | Admit: 2017-01-13 | Discharge: 2017-01-13 | Disposition: A | Discharge status: Home / Self Care | Payer: Medicare Other | Source: Ambulatory Visit | Attending: Family Medicine | Admitting: Family Medicine

## 2017-01-13 ENCOUNTER — Other Ambulatory Visit: Payer: Self-pay | Admitting: Family Medicine

## 2017-01-13 DIAGNOSIS — R0989 Other specified symptoms and signs involving the circulatory and respiratory systems: Secondary | ICD-10-CM

## 2017-01-13 DIAGNOSIS — J449 Chronic obstructive pulmonary disease, unspecified: Secondary | ICD-10-CM

## 2017-01-13 DIAGNOSIS — R059 Cough, unspecified: Secondary | ICD-10-CM

## 2017-01-13 DIAGNOSIS — Z122 Encounter for screening for malignant neoplasm of respiratory organs: Secondary | ICD-10-CM

## 2017-01-13 DIAGNOSIS — R05 Cough: Secondary | ICD-10-CM

## 2017-01-19 ENCOUNTER — Other Ambulatory Visit: Payer: Self-pay | Admitting: Cardiology

## 2017-01-19 ENCOUNTER — Ambulatory Visit (HOSPITAL_COMMUNITY)
Admission: RE | Admit: 2017-01-19 | Discharge: 2017-01-19 | Disposition: A | Discharge status: Home / Self Care | Payer: Medicare Other | Source: Ambulatory Visit | Attending: Vascular Surgery | Admitting: Vascular Surgery

## 2017-01-19 DIAGNOSIS — R609 Edema, unspecified: Secondary | ICD-10-CM

## 2017-01-19 DIAGNOSIS — I872 Venous insufficiency (chronic) (peripheral): Secondary | ICD-10-CM | POA: Insufficient documentation

## 2017-01-20 ENCOUNTER — Ambulatory Visit
Admission: RE | Admit: 2017-01-20 | Discharge: 2017-01-20 | Disposition: A | Discharge status: Home / Self Care | Payer: Medicare Other | Source: Ambulatory Visit | Attending: Family Medicine | Admitting: Family Medicine

## 2017-01-20 DIAGNOSIS — Z122 Encounter for screening for malignant neoplasm of respiratory organs: Secondary | ICD-10-CM

## 2017-07-24 ENCOUNTER — Encounter (HOSPITAL_COMMUNITY): Payer: Self-pay | Admitting: Emergency Medicine

## 2017-07-24 ENCOUNTER — Inpatient Hospital Stay (HOSPITAL_COMMUNITY)
Admission: EM | Admit: 2017-07-24 | Discharge: 2017-08-07 | DRG: 190 | Disposition: A | Discharge status: Skilled Nursing Facility | Payer: Medicare Other | Attending: Family Medicine | Admitting: Family Medicine

## 2017-07-24 ENCOUNTER — Other Ambulatory Visit: Payer: Self-pay

## 2017-07-24 ENCOUNTER — Emergency Department (HOSPITAL_COMMUNITY): Payer: Medicare Other

## 2017-07-24 DIAGNOSIS — Z66 Do not resuscitate: Secondary | ICD-10-CM | POA: Diagnosis not present

## 2017-07-24 DIAGNOSIS — Z7189 Other specified counseling: Secondary | ICD-10-CM | POA: Diagnosis not present

## 2017-07-24 DIAGNOSIS — I10 Essential (primary) hypertension: Secondary | ICD-10-CM | POA: Diagnosis present

## 2017-07-24 DIAGNOSIS — I110 Hypertensive heart disease with heart failure: Secondary | ICD-10-CM | POA: Diagnosis present

## 2017-07-24 DIAGNOSIS — E875 Hyperkalemia: Secondary | ICD-10-CM | POA: Diagnosis not present

## 2017-07-24 DIAGNOSIS — J9601 Acute respiratory failure with hypoxia: Secondary | ICD-10-CM | POA: Diagnosis not present

## 2017-07-24 DIAGNOSIS — J9622 Acute and chronic respiratory failure with hypercapnia: Secondary | ICD-10-CM | POA: Diagnosis not present

## 2017-07-24 DIAGNOSIS — R0602 Shortness of breath: Secondary | ICD-10-CM | POA: Diagnosis not present

## 2017-07-24 DIAGNOSIS — F17210 Nicotine dependence, cigarettes, uncomplicated: Secondary | ICD-10-CM | POA: Diagnosis present

## 2017-07-24 DIAGNOSIS — Z7951 Long term (current) use of inhaled steroids: Secondary | ICD-10-CM | POA: Diagnosis not present

## 2017-07-24 DIAGNOSIS — R609 Edema, unspecified: Secondary | ICD-10-CM

## 2017-07-24 DIAGNOSIS — Z7952 Long term (current) use of systemic steroids: Secondary | ICD-10-CM

## 2017-07-24 DIAGNOSIS — Z7982 Long term (current) use of aspirin: Secondary | ICD-10-CM | POA: Diagnosis not present

## 2017-07-24 DIAGNOSIS — I5033 Acute on chronic diastolic (congestive) heart failure: Secondary | ICD-10-CM | POA: Diagnosis not present

## 2017-07-24 DIAGNOSIS — J441 Chronic obstructive pulmonary disease with (acute) exacerbation: Secondary | ICD-10-CM | POA: Diagnosis present

## 2017-07-24 DIAGNOSIS — Z9981 Dependence on supplemental oxygen: Secondary | ICD-10-CM

## 2017-07-24 DIAGNOSIS — K7689 Other specified diseases of liver: Secondary | ICD-10-CM | POA: Diagnosis present

## 2017-07-24 DIAGNOSIS — R160 Hepatomegaly, not elsewhere classified: Secondary | ICD-10-CM

## 2017-07-24 DIAGNOSIS — Z79899 Other long term (current) drug therapy: Secondary | ICD-10-CM | POA: Diagnosis not present

## 2017-07-24 DIAGNOSIS — I5032 Chronic diastolic (congestive) heart failure: Secondary | ICD-10-CM | POA: Diagnosis not present

## 2017-07-24 DIAGNOSIS — R600 Localized edema: Secondary | ICD-10-CM | POA: Diagnosis not present

## 2017-07-24 DIAGNOSIS — J449 Chronic obstructive pulmonary disease, unspecified: Secondary | ICD-10-CM

## 2017-07-24 DIAGNOSIS — E119 Type 2 diabetes mellitus without complications: Secondary | ICD-10-CM | POA: Diagnosis present

## 2017-07-24 DIAGNOSIS — Z8249 Family history of ischemic heart disease and other diseases of the circulatory system: Secondary | ICD-10-CM | POA: Diagnosis not present

## 2017-07-24 DIAGNOSIS — Z9119 Patient's noncompliance with other medical treatment and regimen: Secondary | ICD-10-CM

## 2017-07-24 DIAGNOSIS — Z515 Encounter for palliative care: Secondary | ICD-10-CM

## 2017-07-24 DIAGNOSIS — J9600 Acute respiratory failure, unspecified whether with hypoxia or hypercapnia: Secondary | ICD-10-CM | POA: Diagnosis present

## 2017-07-24 DIAGNOSIS — J9621 Acute and chronic respiratory failure with hypoxia: Secondary | ICD-10-CM | POA: Diagnosis present

## 2017-07-24 DIAGNOSIS — F17200 Nicotine dependence, unspecified, uncomplicated: Secondary | ICD-10-CM | POA: Diagnosis present

## 2017-07-24 DIAGNOSIS — J431 Panlobular emphysema: Secondary | ICD-10-CM | POA: Diagnosis present

## 2017-07-24 DIAGNOSIS — I351 Nonrheumatic aortic (valve) insufficiency: Secondary | ICD-10-CM | POA: Diagnosis not present

## 2017-07-24 DIAGNOSIS — Z720 Tobacco use: Secondary | ICD-10-CM | POA: Diagnosis not present

## 2017-07-24 DIAGNOSIS — J9690 Respiratory failure, unspecified, unspecified whether with hypoxia or hypercapnia: Secondary | ICD-10-CM

## 2017-07-24 DIAGNOSIS — I472 Ventricular tachycardia: Secondary | ICD-10-CM | POA: Diagnosis present

## 2017-07-24 DIAGNOSIS — R000 Tachycardia, unspecified: Secondary | ICD-10-CM | POA: Diagnosis present

## 2017-07-24 DIAGNOSIS — J9602 Acute respiratory failure with hypercapnia: Secondary | ICD-10-CM | POA: Diagnosis not present

## 2017-07-24 LAB — COMPREHENSIVE METABOLIC PANEL
ALBUMIN: 3.6 g/dL (ref 3.5–5.0)
ALT: 17 U/L (ref 14–54)
AST: 18 U/L (ref 15–41)
Alkaline Phosphatase: 51 U/L (ref 38–126)
Anion gap: 10 (ref 5–15)
BUN: 18 mg/dL (ref 6–20)
CHLORIDE: 89 mmol/L — AB (ref 101–111)
CO2: 40 mmol/L — AB (ref 22–32)
Calcium: 9.1 mg/dL (ref 8.9–10.3)
Creatinine, Ser: 0.78 mg/dL (ref 0.44–1.00)
GFR calc Af Amer: >60 mL/min (ref >60)
GFR calc non Af Amer: >60 mL/min (ref >60)
GLUCOSE: 120 mg/dL — AB (ref 65–99)
POTASSIUM: 3.4 mmol/L — AB (ref 3.5–5.1)
SODIUM: 139 mmol/L (ref 135–145)
Total Bilirubin: 0.4 mg/dL (ref 0.3–1.2)
Total Protein: 6.7 g/dL (ref 6.5–8.1)

## 2017-07-24 LAB — CBC WITH DIFFERENTIAL/PLATELET
Basophils Absolute: 0 10*3/uL (ref 0.0–0.1)
Basophils Relative: 0 %
EOS PCT: 1 %
Eosinophils Absolute: 0.2 10*3/uL (ref 0.0–0.7)
HEMATOCRIT: 43.9 % (ref 36.0–46.0)
Hemoglobin: 13.9 g/dL (ref 12.0–15.0)
LYMPHS ABS: 2.8 10*3/uL (ref 0.7–4.0)
LYMPHS PCT: 21 %
MCH: 30.1 pg (ref 26.0–34.0)
MCHC: 31.7 g/dL (ref 30.0–36.0)
MCV: 95 fL (ref 78.0–100.0)
MONO ABS: 1.1 10*3/uL — AB (ref 0.1–1.0)
Monocytes Relative: 9 %
NEUTROS ABS: 9.4 10*3/uL — AB (ref 1.7–7.7)
Neutrophils Relative %: 69 %
PLATELETS: 340 10*3/uL (ref 150–400)
RBC: 4.62 MIL/uL (ref 3.87–5.11)
RDW: 14.2 % (ref 11.5–15.5)
WBC: 13.5 10*3/uL — ABNORMAL HIGH (ref 4.0–10.5)

## 2017-07-24 LAB — BRAIN NATRIURETIC PEPTIDE: B NATRIURETIC PEPTIDE 5: 84.9 pg/mL (ref 0.0–100.0)

## 2017-07-24 LAB — TROPONIN I
TROPONIN I: 0.03 ng/mL — AB (ref <0.03)
Troponin I: <0.03 ng/mL (ref <0.03)

## 2017-07-24 MED ORDER — ASPIRIN 81 MG PO CHEW
81.0000 mg | CHEWABLE_TABLET | Freq: Every day | ORAL | Status: DC
  Administered 2017-07-25 – 2017-08-07 (×14): 81 mg via ORAL
  Filled 2017-07-24 (×14): qty 1

## 2017-07-24 MED ORDER — RALOXIFENE HCL 60 MG PO TABS
60.0000 mg | ORAL_TABLET | Freq: Every day | ORAL | Status: DC
  Administered 2017-07-25 – 2017-08-07 (×14): 60 mg via ORAL
  Filled 2017-07-24 (×17): qty 1

## 2017-07-24 MED ORDER — SODIUM CHLORIDE 0.9 % IV SOLN
250.0000 mL | INTRAVENOUS | Status: DC | PRN
Start: 2017-07-24 — End: 2017-08-07

## 2017-07-24 MED ORDER — SODIUM CHLORIDE 0.9% FLUSH
3.0000 mL | Freq: Two times a day (BID) | INTRAVENOUS | Status: DC
  Administered 2017-07-24 – 2017-08-06 (×25): 3 mL via INTRAVENOUS

## 2017-07-24 MED ORDER — METHYLPREDNISOLONE SODIUM SUCC 125 MG IJ SOLR
125.0000 mg | Freq: Once | INTRAMUSCULAR | Status: AC
  Administered 2017-07-24: 125 mg via INTRAVENOUS
  Filled 2017-07-24 (×2): qty 2

## 2017-07-24 MED ORDER — ACETAMINOPHEN 325 MG PO TABS
650.0000 mg | ORAL_TABLET | Freq: Four times a day (QID) | ORAL | Status: DC | PRN
  Administered 2017-08-01 – 2017-08-04 (×2): 650 mg via ORAL
  Filled 2017-07-24 (×2): qty 2

## 2017-07-24 MED ORDER — METHYLPREDNISOLONE SODIUM SUCC 40 MG IJ SOLR
40.0000 mg | Freq: Three times a day (TID) | INTRAMUSCULAR | Status: DC
  Administered 2017-07-24 – 2017-07-26 (×5): 40 mg via INTRAVENOUS
  Filled 2017-07-24 (×6): qty 1

## 2017-07-24 MED ORDER — FUROSEMIDE 40 MG PO TABS
40.0000 mg | ORAL_TABLET | Freq: Every day | ORAL | Status: DC
  Administered 2017-07-25 – 2017-07-28 (×4): 40 mg via ORAL
  Filled 2017-07-24 (×4): qty 1

## 2017-07-24 MED ORDER — SODIUM CHLORIDE 0.9% FLUSH: 3.0000 mL | INTRAVENOUS | Status: DC | PRN

## 2017-07-24 MED ORDER — BENZONATATE 100 MG PO CAPS
200.0000 mg | ORAL_CAPSULE | Freq: Three times a day (TID) | ORAL | Status: DC | PRN
  Administered 2017-07-27 – 2017-08-04 (×4): 200 mg via ORAL
  Filled 2017-07-24 (×4): qty 2

## 2017-07-24 MED ORDER — IPRATROPIUM-ALBUTEROL 0.5-2.5 (3) MG/3ML IN SOLN
3.0000 mL | Freq: Once | RESPIRATORY_TRACT | Status: AC
  Administered 2017-07-24: 3 mL via RESPIRATORY_TRACT
  Filled 2017-07-24: qty 3

## 2017-07-24 MED ORDER — ONDANSETRON HCL 4 MG PO TABS: 4.0000 mg | ORAL_TABLET | Freq: Four times a day (QID) | ORAL | Status: DC | PRN

## 2017-07-24 MED ORDER — IPRATROPIUM-ALBUTEROL 0.5-2.5 (3) MG/3ML IN SOLN: 3.0000 mL | RESPIRATORY_TRACT | Status: DC | PRN

## 2017-07-24 MED ORDER — AZITHROMYCIN 250 MG PO TABS
500.0000 mg | ORAL_TABLET | Freq: Every day | ORAL | Status: AC
  Administered 2017-07-24 – 2017-07-28 (×5): 500 mg via ORAL
  Filled 2017-07-24 (×5): qty 2

## 2017-07-24 MED ORDER — ENOXAPARIN SODIUM 40 MG/0.4ML ~~LOC~~ SOLN
40.0000 mg | SUBCUTANEOUS | Status: DC
  Administered 2017-07-24 – 2017-08-06 (×14): 40 mg via SUBCUTANEOUS
  Filled 2017-07-24 (×14): qty 0.4

## 2017-07-24 MED ORDER — ACETAMINOPHEN 650 MG RE SUPP: 650.0000 mg | Freq: Four times a day (QID) | RECTAL | Status: DC | PRN

## 2017-07-24 MED ORDER — ALBUTEROL SULFATE (2.5 MG/3ML) 0.083% IN NEBU
5.0000 mg | INHALATION_SOLUTION | Freq: Once | RESPIRATORY_TRACT | Status: AC
  Administered 2017-07-24: 5 mg via RESPIRATORY_TRACT
  Filled 2017-07-24: qty 6

## 2017-07-24 MED ORDER — LOSARTAN POTASSIUM 50 MG PO TABS
50.0000 mg | ORAL_TABLET | Freq: Every day | ORAL | Status: DC
  Administered 2017-07-25 – 2017-07-29 (×5): 50 mg via ORAL
  Filled 2017-07-24 (×6): qty 1

## 2017-07-24 MED ORDER — MOMETASONE FURO-FORMOTEROL FUM 200-5 MCG/ACT IN AERO
2.0000 | INHALATION_SPRAY | Freq: Two times a day (BID) | RESPIRATORY_TRACT | Status: DC
  Administered 2017-07-24 – 2017-07-26 (×4): 2 via RESPIRATORY_TRACT
  Filled 2017-07-24: qty 8.8

## 2017-07-24 MED ORDER — IPRATROPIUM-ALBUTEROL 0.5-2.5 (3) MG/3ML IN SOLN
3.0000 mL | RESPIRATORY_TRACT | Status: DC
  Administered 2017-07-24 – 2017-07-26 (×11): 3 mL via RESPIRATORY_TRACT
  Filled 2017-07-24 (×11): qty 3

## 2017-07-24 MED ORDER — ONDANSETRON HCL 4 MG/2ML IJ SOLN: 4.0000 mg | Freq: Four times a day (QID) | INTRAMUSCULAR | Status: DC | PRN

## 2017-07-24 NOTE — ED Provider Notes (Addendum)
[facility]-EMERGENCY DEPT Provider Note   CSN: [id number] Arrival date & time: 07/24/17  1001     History   Chief Complaint Chief Complaint  Patient presents with  . Shortness of Breath    HPI Katie Waller is a 71 y.o. female.  71 year old female with history of Asthma/COPD, DM, HTN presents with shortness of breath. She reports that symptoms that have worsened over the last week.  She reports diffuse wheezing.  She denies fever or chest pain. She reports recent trial of both lasix and prednisone within the last 1-2 weeks without improvement.    The history is provided by the patient and a relative.  Shortness of Breath  This is a recurrent problem. The problem occurs rarely.The current episode started more than 2 days ago. The problem has been gradually worsening. Associated symptoms include wheezing. Pertinent negatives include no fever.    Past Medical History:  Diagnosis Date  . Asthma   . Diabetes (HCC)   . Heart disease   . Hypertension     Patient Active Problem List   Diagnosis Date Noted  . Venous stasis of both lower extremities 06/13/2016  . Acute respiratory failure (HCC) 06/08/2016  . Abnormal finding on EKG 09/19/2013  . Prediabetes 09/19/2013  . Sinus tachycardia 09/18/2013  . Essential hypertension 09/18/2013  . Near syncope 09/17/2013  . COPD exacerbation (HCC) 09/17/2013  . Smoker 09/17/2013    Past Surgical History:  Procedure Laterality Date  . KNEE SURGERY Left     OB History    No data available       Home Medications    Prior to Admission medications   Medication Sig Start Date End Date Taking? Authorizing Provider  albuterol (PROVENTIL HFA;VENTOLIN HFA) 108 (90 BASE) MCG/ACT inhaler Inhale 2 puffs into the lungs every 6 (six) hours as needed for wheezing or shortness of breath. 09/19/13  Yes Johnson, Clanford L, MD  aspirin 81 MG chewable tablet Chew 1 tablet (81 mg total) by mouth daily. 09/19/13  Yes  Johnson, Clanford L, MD  benzonatate (TESSALON) 100 MG capsule Take 200 mg 3 (three) times daily as needed by mouth for cough.   Yes [provider]  furosemide (LASIX) 40 MG tablet Take 40 mg daily by mouth.   Yes [provider]  losartan (COZAAR) 50 MG tablet Take 50 mg daily by mouth.   Yes [provider]  predniSONE (DELTASONE) 20 MG tablet Take 20 mg daily with breakfast by mouth. 07/11/17  Yes [provider]  raloxifene (EVISTA) 60 MG tablet Take 60 mg daily by mouth.   Yes [provider]  SYMBICORT 160-4.5 MCG/ACT inhaler Inhale 2 puffs into the lungs 2 (two) times daily. 06/04/16  Yes [provider]  tiotropium (SPIRIVA) 18 MCG inhalation capsule Place 18 mcg daily into inhaler and inhale.   Yes [provider]  benzonatate (TESSALON) 200 MG capsule Take 1 capsule (200 mg total) by mouth 3 (three) times daily as needed for cough. Patient not taking: Reported on 07/24/2017 06/13/16   Calvert Cantorizwan, Saima, MD  dextromethorphan-guaiFENesin [facility](MUCINEX DM) 30-600 MG 12hr tablet Take 1 tablet by mouth 2 (two) times daily. Patient not taking: Reported on 07/24/2017 06/13/16   Calvert Cantorizwan, Saima, MD  Elastic Bandages & Supports (T.E.D. BELOW KNEE/LARGE) MISC 1 each by Does not apply route daily. On in AM and off in PM Please measure patient and give appropriate size. Patient not taking: Reported on 07/24/2017 06/13/16   Rizwan,  Ladell HeadsSaima, MD  predniSONE (DELTASONE) 10 MG tablet Take 6 tablets (60 mg total) by mouth daily with breakfast. Take 60 mg by mouth daily for  Then 40 mg by mouth for 3  Days Then 20 mg x 3 days Then 10 mg x 3 days Then 5 mg x 3 days Patient not taking: Reported on 07/24/2017 06/13/16   Calvert Cantorizwan, Saima, MD    Family History Family History  Problem Relation Age of Onset  . Hyperlipidemia Mother   . Hypertension Mother   . Hypertension Father   . Hyperlipidemia Father     Social History Social History   Tobacco Use  .  Smoking status: Current Every Day Smoker    Types: Cigarettes  . Smokeless tobacco: Never Used  . Tobacco comment: vapor  Substance Use Topics  . Alcohol use: No  . Drug use: No     Allergies   Ace inhibitors and Codeine   Review of Systems Review of Systems  Constitutional: Negative for fever.  Respiratory: Positive for shortness of breath and wheezing.   All other systems reviewed and are negative.    Physical Exam Updated Vital Signs BP (!) 126/59   Pulse (!) 104   Temp 97.9 F (36.6 C) (Oral)   Resp 19   Ht 5\' 1"  (1.549 m)   Wt 61.2 kg (135 lb)   SpO2 98%   BMI 25.51 kg/m   Physical Exam  Constitutional: She is oriented to person, place, and time. She appears well-developed and well-nourished. No distress.  HENT:  Head: Normocephalic and atraumatic.  Mouth/Throat: Oropharynx is clear and moist.  Eyes: Conjunctivae are normal. Pupils are equal, round, and reactive to light.  Neck: Normal range of motion. Neck supple.  Cardiovascular: Normal rate, regular rhythm and normal heart sounds.  No murmur heard. Pulmonary/Chest: Effort normal. No respiratory distress.  Diffuse expiratory wheezes bilaterally  Abdominal: Soft. She exhibits no distension. There is no tenderness.  Musculoskeletal: Normal range of motion.       Right lower leg: She exhibits edema.       Left lower leg: She exhibits edema.  Trace BLE edema (1 plus)   Neurological: She is alert and oriented to person, place, and time.  Skin: Skin is warm and dry.  Psychiatric: She has a normal mood and affect.  Nursing note and vitals reviewed.    ED Treatments / Results  Labs (all labs ordered are listed, but only abnormal results are displayed) Labs Reviewed  COMPREHENSIVE METABOLIC PANEL - Abnormal; Notable for the following components:      Result Value   Potassium 3.4 (*)    Chloride 89 (*)    CO2 40 (*)    Glucose, Bld 120 (*)    All other components within normal limits  CBC WITH  DIFFERENTIAL/PLATELET - Abnormal; Notable for the following components:   WBC 13.5 (*)    Neutro Abs 9.4 (*)    Monocytes Absolute 1.1 (*)    All other components within normal limits  TROPONIN I - Abnormal; Notable for the following components:   Troponin I 0.03 (*)    All other components within normal limits  BRAIN NATRIURETIC PEPTIDE    EKG  EKG Interpretation  Date/Time:  Monday July 24 2017 11:17:04 EST Ventricular Rate:  105 PR Interval:    QRS Duration: 73 QT Interval:  327 QTC Calculation: 433 R Axis:   71 Text Interpretation:  Sinus tachycardia Biatrial enlargement Anterior infarct, old Confirmed by Rodena MedinMessick,  Theron Arista [id number]) on 07/24/2017 11:32:53 AM       Radiology Dg Chest 2 View  Result Date: 07/24/2017 CLINICAL DATA:  Severe dyspnea, cough and chest congestion. Symptoms have worsened yesterday and today. History of COPD, current smoker. EXAM: CHEST  2 VIEW COMPARISON:  Chest x-ray of Jan 13, 2017 FINDINGS: The lungs remain hyperinflated with hemidiaphragm flattening. There is no focal infiltrate. There is no pleural effusion. The cardiac silhouette is top-normal in size. The pulmonary vascularity is not engorged. There is calcification in the wall of the thoracic aorta. There is no pleural effusion or pneumothorax. The bony thorax is unremarkable. IMPRESSION: COPD.  No acute pneumonia nor pulmonary edema. Thoracic aortic atherosclerosis. Electronically Signed   By: David  [place] M.D.   On: 07/24/2017 11:59    Procedures Procedures (including critical care time)  Medications Ordered in ED Medications  albuterol (PROVENTIL) (2.5 MG/3ML) 0.083% nebulizer solution 5 mg (5 mg Nebulization Given 07/24/17 1042)  methylPREDNISolone sodium succinate (SOLU-MEDROL) 125 mg/2 mL injection 125 mg (125 mg Intravenous Given 07/24/17 1144)  ipratropium-albuterol (DUONEB) 0.5-2.5 (3) MG/3ML nebulizer solution 3 mL (3 mLs Nebulization Given 07/24/17 1315)     Initial  Impression / Assessment and Plan / ED Course  I have reviewed the triage vital signs and the nursing notes.  Pertinent labs & imaging results that were available during my care of the patient were reviewed by me and considered in my medical decision making (see chart for details).     MSE Complete  Presentation is most consistent with COPD/asthma exacerbation.  She appears to be moderately improved after initial ED management. However, we will admit for further workup and treatment.  Hospitalist service is aware of the case and will evaluate for admission.   Final Clinical Impressions(s) / ED Diagnoses   Final diagnoses:  COPD exacerbation [facility])    ED Discharge Orders    None       Wynetta Fines, MD 07/24/17 1402    Wynetta Fines, MD 07/24/17 [phone]

## 2017-07-24 NOTE — ED Triage Notes (Signed)
Per daughter, states increased SOB for the past couple of days-states lower extremity swelling and left hand swelling-states she is also having some facial twitching-saw PCP and was given antibiotics, cough med, and prednisone-states little improvement-states she has never been diagnosed with any cardiac issues

## 2017-07-24 NOTE — ED Notes (Signed)
ED TO INPATIENT HANDOFF REPORT  Name/Age/Gender Katie Waller 71 y.o. female  Code Status Code Status History    Date Active Date Inactive Code Status Order ID Comments User Context   06/08/2016 13:35 06/13/2016 20:53 Full Code [id number]  Katie Waller ED   06/08/2016 12:35 06/08/2016 13:35 Full Code [id number]  Katie Waller ED   09/17/2013 03:42 09/19/2013 14:28 Full Code [id number]  Katie Mull, MD ED      Home/SNF/Other Nursing Home  Chief Complaint congestion,tremmors,swelling feet,legs and hands  Level of Care/Admitting Diagnosis ED Disposition    ED Disposition Condition [place]: [facility] [[id number]]  Level of Care: Telemetry [5]  Admit to tele based on following criteria: Other see comments  Comments: copd exacerbation  Diagnosis: COPD exacerbation [facility]) [[id number]]  Admitting Physician: Tawni Millers [[id number]]  Attending Physician: Tawni Millers [[id number]]  Estimated length of stay: 3 - 4 days  Certification:: I certify this patient will need inpatient services for at least 2 midnights  PT Class (Do Not Modify): Inpatient [101]  PT Acc Code (Do Not Modify): Private [1]       Medical History Past Medical History:  Diagnosis Date  . Asthma   . Diabetes ([place])   . Heart disease   . Hypertension     Allergies Allergies  Allergen Reactions  . Ace Inhibitors Swelling  . Codeine Nausea And Vomiting    IV Location/Drains/Wounds Patient Lines/Drains/Airways Status   Active Line/Drains/Airways    Name:   Placement date:   Placement time:   Site:   Days:   Peripheral IV 07/24/17 Right Antecubital   07/24/17    1135    Antecubital   less than 1          Labs/Imaging Results for orders placed or performed during the hospital encounter of 07/24/17 (from the past 48 hour(s))  Comprehensive metabolic panel     Status: Abnormal   Collection Time: 07/24/17 11:00 AM  Result Value Ref  Range   Sodium 139 135 - 145 mmol/L   Potassium 3.4 (L) 3.5 - 5.1 mmol/L   Chloride 89 (L) 101 - 111 mmol/L   CO2 40 (H) 22 - 32 mmol/L   Glucose, Bld 120 (H) 65 - 99 mg/dL   BUN 18 6 - 20 mg/dL   Creatinine, Ser 0.78 0.44 - 1.00 mg/dL   Calcium 9.1 8.9 - 10.3 mg/dL   Total Protein 6.7 6.5 - 8.1 g/dL   Albumin 3.6 3.5 - 5.0 g/dL   AST 18 15 - 41 U/L   ALT 17 14 - 54 U/L   Alkaline Phosphatase 51 38 - 126 U/L   Total Bilirubin 0.4 0.3 - 1.2 mg/dL   GFR calc non Af Amer >60 >60 mL/min   GFR calc Af Amer >60 >60 mL/min    Comment: (NOTE) The eGFR has been calculated using the CKD EPI equation. This calculation has not been validated in all clinical situations. eGFR's persistently <60 mL/min signify possible Chronic Kidney Disease.    Anion gap 10 5 - 15  CBC with Differential     Status: Abnormal   Collection Time: 07/24/17 11:00 AM  Result Value Ref Range   WBC 13.5 (H) 4.0 - 10.5 K/uL   RBC 4.62 3.87 - 5.11 MIL/uL   Hemoglobin 13.9 12.0 - 15.0 g/dL   HCT 43.9 36.0 - 46.0 %   MCV 95.0 78.0 - 100.0 fL  MCH 30.1 26.0 - 34.0 pg   MCHC 31.7 30.0 - 36.0 g/dL   RDW 14.2 11.5 - 15.5 %   Platelets 340 150 - 400 K/uL   Neutrophils Relative % 69 %   Neutro Abs 9.4 (H) 1.7 - 7.7 K/uL   Lymphocytes Relative 21 %   Lymphs Abs 2.8 0.7 - 4.0 K/uL   Monocytes Relative 9 %   Monocytes Absolute 1.1 (H) 0.1 - 1.0 K/uL   Eosinophils Relative 1 %   Eosinophils Absolute 0.2 0.0 - 0.7 K/uL   Basophils Relative 0 %   Basophils Absolute 0.0 0.0 - 0.1 K/uL  Troponin I     Status: Abnormal   Collection Time: 07/24/17 11:00 AM  Result Value Ref Range   Troponin I 0.03 (HH) <0.03 ng/mL    Comment: CRITICAL RESULT CALLED TO, READ BACK BY AND VERIFIED WITH: WEST,S @ 0881 ON [id number] BY POTEAT,S   Brain natriuretic peptide     Status: None   Collection Time: 07/24/17 11:00 AM  Result Value Ref Range   B Natriuretic Peptide 84.9 0.0 - 100.0 pg/mL   Dg Chest 2 View  Result Date:  07/24/2017 CLINICAL DATA:  Severe dyspnea, cough and chest congestion. Symptoms have worsened yesterday and today. History of COPD, current smoker. EXAM: CHEST  2 VIEW COMPARISON:  Chest x-ray of Jan 13, 2017 FINDINGS: The lungs remain hyperinflated with hemidiaphragm flattening. There is no focal infiltrate. There is no pleural effusion. The cardiac silhouette is top-normal in size. The pulmonary vascularity is not engorged. There is calcification in the wall of the thoracic aorta. There is no pleural effusion or pneumothorax. The bony thorax is unremarkable. IMPRESSION: COPD.  No acute pneumonia nor pulmonary edema. Thoracic aortic atherosclerosis. Electronically Signed   By: David  [place] M.D.   On: 07/24/2017 11:59    Pending Labs Unresulted Labs (From admission, onward)   None      Vitals/Pain Today's Vitals   07/24/17 1042 07/24/17 1130 07/24/17 1315 07/24/17 1400  BP:  (!) 161/72 (!) 126/59 (!) 155/68  Pulse: (!) 110 (!) 105 (!) 104 (!) 102  Resp: 20 (!) '23 19 18  '$ Temp:      TempSrc:      SpO2: 97% 97% 98% 95%  Weight:      Height:        Isolation Precautions No active isolations  Medications Medications  albuterol (PROVENTIL) (2.5 MG/3ML) 0.083% nebulizer solution 5 mg (5 mg Nebulization Given 07/24/17 1042)  methylPREDNISolone sodium succinate (SOLU-MEDROL) 125 mg/2 mL injection 125 mg (125 mg Intravenous Given 07/24/17 1144)  ipratropium-albuterol (DUONEB) 0.5-2.5 (3) MG/3ML nebulizer solution 3 mL (3 mLs Nebulization Given 07/24/17 1315)    Mobility walks with device

## 2017-07-24 NOTE — Progress Notes (Signed)
Patient had 7 beats of Vtach. Patient asymptomatic, denies chest pain or dizziness. Vital signs stable. MD made aware. Will continue to monitor patient.   Arta BruceDeutsch, Amaury Kuzel [facility] 07/24/2017 6:25 PM

## 2017-07-24 NOTE — H&P (Signed)
History and Physical    Katie Waller VHQ:[id number] DOB: [birth year] DOA: 07/24/2017  PCP: Elias Elseeade, Robert, MD   Patient coming from: Home  Chief Complaint: Dyspnea  HPI: Katie Waller is a 71 y.o. female with medical history significant of COPD, asthma, diabetes mellitus and hypertension. Patient presents with progressive dyspnea over last 3 weeks, more severe over last 3 days. Her dyspnea has been persistent and severe in intensity, no improving factors, worse with exertion, associated with wheezing and dry cough as well as decreased level of energy. Denies any nausea or vomiting.  Her symptoms have been progressive despite use of bronchodilator therapy at home, she has recently been placed on furosemide for lower extremity edema. She also has developed worsening tremors on her left hand and head that have affected her sleep.    ED Course: Patient was diagnosed with a COPD exacerbation, she was placed on systemic steroids and bronchodilators, symptoms were persistent despite initial medical therapy. He was referred for admission for evaluation.  Review of Systems:  1. General: No fevers, no chills, no weight gain or weight loss 2. ENT: No runny nose or sore throat, no hearing disturbances 3. Pulmonary: No dyspnea, cough, wheezing, or hemoptysis 4. Cardiovascular: No angina, claudication, lower extremity edema, pnd or orthopnea 5. Gastrointestinal: No nausea or vomiting, no diarrhea or constipation 6. Hematology: No easy bruisability or frequent infections 7. Urology: No dysuria, hematuria or increased urinary frequency 8. Dermatology: No rashes. 9. Neurology: No seizures or paresthesias 10. Musculoskeletal: No joint pain or deformities  Past Medical History:  Diagnosis Date  . Asthma   . Diabetes (HCC)   . Heart disease   . Hypertension     Past Surgical History:  Procedure Laterality Date  . KNEE SURGERY Left      reports that she has been smoking cigarettes.  she  has never used smokeless tobacco. She reports that she does not drink alcohol or use drugs.  Allergies  Allergen Reactions  . Ace Inhibitors Swelling  . Codeine Nausea And Vomiting    Family History  Problem Relation Age of Onset  . Hyperlipidemia Mother   . Hypertension Mother   . Hypertension Father   . Hyperlipidemia Father     Prior to Admission medications   Medication Sig Start Date End Date Taking? Authorizing Provider  albuterol (PROVENTIL HFA;VENTOLIN HFA) 108 (90 BASE) MCG/ACT inhaler Inhale 2 puffs into the lungs every 6 (six) hours as needed for wheezing or shortness of breath. 09/19/13  Yes Johnson, Clanford L, MD  aspirin 81 MG chewable tablet Chew 1 tablet (81 mg total) by mouth daily. 09/19/13  Yes Johnson, Clanford L, MD  benzonatate (TESSALON) 100 MG capsule Take 200 mg 3 (three) times daily as needed by mouth for cough.   Yes [provider]  furosemide (LASIX) 40 MG tablet Take 40 mg daily by mouth.   Yes [provider]  losartan (COZAAR) 50 MG tablet Take 50 mg daily by mouth.   Yes [provider]  predniSONE (DELTASONE) 20 MG tablet Take 20 mg daily with breakfast by mouth. 07/11/17  Yes [provider]  raloxifene (EVISTA) 60 MG tablet Take 60 mg daily by mouth.   Yes [provider]  SYMBICORT 160-4.5 MCG/ACT inhaler Inhale 2 puffs into the lungs 2 (two) times daily. 06/04/16  Yes [provider]  tiotropium (SPIRIVA) 18 MCG inhalation capsule Place 18 mcg daily into inhaler and inhale.   Yes [provider]  benzonatate (  TESSALON) 200 MG capsule Take 1 capsule (200 mg total) by mouth 3 (three) times daily as needed for cough. Patient not taking: Reported on 07/24/2017 06/13/16   Calvert Cantor, MD  dextromethorphan-guaiFENesin [facility] DM) 30-600 MG 12hr tablet Take 1 tablet by mouth 2 (two) times daily. Patient not taking: Reported on 07/24/2017 06/13/16   Calvert Cantor, MD  Elastic Bandages & Supports  (T.E.D. BELOW KNEE/LARGE) MISC 1 each by Does not apply route daily. On in AM and off in PM Please measure patient and give appropriate size. Patient not taking: Reported on 07/24/2017 06/13/16   Calvert Cantor, MD  predniSONE (DELTASONE) 10 MG tablet Take 6 tablets (60 mg total) by mouth daily with breakfast. Take 60 mg by mouth daily for  Then 40 mg by mouth for 3  Days Then 20 mg x 3 days Then 10 mg x 3 days Then 5 mg x 3 days Patient not taking: Reported on 07/24/2017 06/13/16   Calvert Cantor, MD    Physical Exam: Vitals:   07/24/17 1011 07/24/17 1042 07/24/17 1130 07/24/17 1315  BP: (!) 152/78  (!) 161/72 (!) 126/59  Pulse: (!) 116 (!) 110 (!) 105 (!) 104  Resp:  20 (!) 23 19  Temp: 97.9 F (36.6 C)     TempSrc: Oral     SpO2: (!) 74% 97% 97% 98%  Weight: 61.2 kg (135 lb)     Height: 5\' 1"  (1.549 m)       Constitutional: deconditioned and ill looking appearing.  Vitals:   07/24/17 1011 07/24/17 1042 07/24/17 1130 07/24/17 1315  BP: (!) 152/78  (!) 161/72 (!) 126/59  Pulse: (!) 116 (!) 110 (!) 105 (!) 104  Resp:  20 (!) 23 19  Temp: 97.9 F (36.6 C)     TempSrc: Oral     SpO2: (!) 74% 97% 97% 98%  Weight: 61.2 kg (135 lb)     Height: 5\' 1"  (1.549 m)      Eyes: PERRL, lids and conjunctivae mild pallor Head normocephalic, nose and ears with no deformities.  ENMT: Mucous membranes are dry. Posterior pharynx clear of any exudate or lesions.Normal dentition.  Neck: normal, supple, no masses, no thyromegaly Respiratory: decreased breath sounds on auscultation bilaterally, psotive wheezing, rhonchi and scattered crackles bilaterally. Normal respiratory effort. No accessory muscle use.  Cardiovascular: Regular rate and rhythm, no murmurs / rubs / gallops. ++ non pitting lower extremity edema. 2+ pedal pulses. No carotid bruits.  Abdomen: no tenderness, no masses palpated. No hepatosplenomegaly. Bowel sounds positive.  Musculoskeletal: no clubbing / cyanosis. No joint deformity  upper and lower extremities. Good ROM, no contractures. Normal muscle tone.  Skin: no rashes, lesions, ulcers. No induration Neurologic: CN 2-12 grossly intact. Sensation intact, DTR normal. Strength 5/5 in all 4.      Labs on Admission: I have personally reviewed following labs and imaging studies  CBC: Recent Labs  Lab 07/24/17 1100  WBC 13.5*  NEUTROABS 9.4*  HGB 13.9  HCT 43.9  MCV 95.0  PLT 340   Basic Metabolic Panel: Recent Labs  Lab 07/24/17 1100  NA 139  K 3.4*  CL 89*  CO2 40*  GLUCOSE 120*  BUN 18  CREATININE 0.78  CALCIUM 9.1   GFR: Estimated Creatinine Clearance: 56.5 mL/min (by C-G formula based on SCr of 0.78 mg/dL). Liver Function Tests: Recent Labs  Lab 07/24/17 1100  AST 18  ALT 17  ALKPHOS 51  BILITOT 0.4  PROT 6.7  ALBUMIN 3.6  No results for input(s): LIPASE, AMYLASE in the last 168 hours. No results for input(s): AMMONIA in the last 168 hours. Coagulation Profile: No results for input(s): INR, PROTIME in the last 168 hours. Cardiac Enzymes: Recent Labs  Lab 07/24/17 1100  TROPONINI 0.03*   BNP (last 3 results) No results for input(s): PROBNP in the last 8760 hours. HbA1C: No results for input(s): HGBA1C in the last 72 hours. CBG: No results for input(s): GLUCAP in the last 168 hours. Lipid Profile: No results for input(s): CHOL, HDL, LDLCALC, TRIG, CHOLHDL, LDLDIRECT in the last 72 hours. Thyroid Function Tests: No results for input(s): TSH, T4TOTAL, FREET4, T3FREE, THYROIDAB in the last 72 hours. Anemia Panel: No results for input(s): VITAMINB12, FOLATE, FERRITIN, TIBC, IRON, RETICCTPCT in the last 72 hours. Urine analysis:    Component Value Date/Time   COLORURINE YELLOW 09/17/2013 0057   APPEARANCEUR CLEAR 09/17/2013 0057   LABSPEC 1.021 09/17/2013 0057   PHURINE 6.5 09/17/2013 0057   GLUCOSEU NEGATIVE 09/17/2013 0057   HGBUR NEGATIVE 09/17/2013 0057   BILIRUBINUR NEGATIVE 09/17/2013 0057   KETONESUR NEGATIVE  09/17/2013 0057   PROTEINUR NEGATIVE 09/17/2013 0057   UROBILINOGEN 0.2 09/17/2013 0057   NITRITE NEGATIVE 09/17/2013 0057   LEUKOCYTESUR NEGATIVE 09/17/2013 0057    Radiological Exams on Admission: Dg Chest 2 View  Result Date: 07/24/2017 CLINICAL DATA:  Severe dyspnea, cough and chest congestion. Symptoms have worsened yesterday and today. History of COPD, current smoker. EXAM: CHEST  2 VIEW COMPARISON:  Chest x-ray of Jan 13, 2017 FINDINGS: The lungs remain hyperinflated with hemidiaphragm flattening. There is no focal infiltrate. There is no pleural effusion. The cardiac silhouette is top-normal in size. The pulmonary vascularity is not engorged. There is calcification in the wall of the thoracic aorta. There is no pleural effusion or pneumothorax. The bony thorax is unremarkable. IMPRESSION: COPD.  No acute pneumonia nor pulmonary edema. Thoracic aortic atherosclerosis. Electronically Signed   By: David  Swazil[place]Jordan M.D.   On: 07/24/2017 11:59    EKG: Independently reviewed. Sinus rhythm, rate of 105 bpm, poor R-wave progression, normal axis, biatrial enlargement.   Assessment/Plan Active Problems:   COPD exacerbation (HCC)  71 year old female with asthma/COPD presents with worsening dyspnea for last 3 weeks, more severe over last 3 days. Dyspnea associated with wheezing and dry cough. On initial physical examination blood pressure 159/80, temperature 98.3, heart rate 107, respiratory 18, oxygen saturation 99% on 2 L nasal cannula. Her lungs had positive wheezing, rales and rhonchi bilaterally, heart S1-S2 present and rhythmic, abdomen soft nontender, positive non-pitting lower extremity edema. Sodium 139, potassium 3.4, chloride 89, bicarbonate 40, glucose 120, BUN 18, creatinine 0.78, white count 13.5, hemoglobin 13.9, hematocrit 43.9, platelets 340, chest x-ray personally reviewed, right rotation, positive signs of hyperinflation, no effusions, and there is no pneumothorax.   Working  diagnosis acute COPD exacerbation.  1. COPD exacerbation. Will admit patient to medical ward, she will be placed on systemic steroids intravenously 40 mg every 8 hours, continue aggressive bronchodilator therapy with DuoNeb scheduled every 4 hours and as needed every 2 hours, oximetry monitoring and supplemental option per nasal cannula, target O2 saturation greater than 92%, will add five-day course of azithromycin.  2. Hypertension. Continue blood pressure control but losartan 50 mg daily. Electrocardiogram with biatrial enlargement, poor R-wave progression, will request a echocardiography, to follow-up on left ventricle and right ventricle systolic function, as well as PA pressures. Continue home dose of furosemide for now.  3. Lower extremity edema. Further workup  with echocardiography to assess PA pressures and LV function. For now will continue with home dose of diuretics.  4. Reported 7 beats of V. Tach on telemetry. Will continue telemetry monitoring, follow-up echocardiography, will do serial set of cardiac enzymes.   DVT prophylaxis: enoxaparin Code Status: full  Family Communication: I spoke with patient's daughter at bedside and all questions were addressed.  Disposition Plan: home  Consults called: none  Admission status: Inpatient    Arelie Kuzel Annett Gula MD [facility] Pager [phone]  If 7PM-7AM, please contact night-coverage [email or web address] Password [email or web address]  07/24/2017, 1:58 PM

## 2017-07-25 ENCOUNTER — Inpatient Hospital Stay (HOSPITAL_COMMUNITY): Payer: Medicare Other

## 2017-07-25 DIAGNOSIS — R600 Localized edema: Secondary | ICD-10-CM

## 2017-07-25 DIAGNOSIS — I351 Nonrheumatic aortic (valve) insufficiency: Secondary | ICD-10-CM

## 2017-07-25 DIAGNOSIS — Z720 Tobacco use: Secondary | ICD-10-CM

## 2017-07-25 LAB — ECHOCARDIOGRAM COMPLETE
CHL CUP DOP CALC LVOT VTI: 22.9 cm
E/e' ratio: 8.94
FS: 32 % (ref 28–44)
HEIGHTINCHES: 61 in
IVS/LV PW RATIO, ED: 1.01
LA ID, A-P, ES: 22.2 mm
LA diam end sys: 22.2 mm
LADIAMINDEX: 1.35 cm/m2
LV PW d: 11.4 mm — AB (ref 0.6–1.1)
LV TDI E'LATERAL: 10.3
LV TDI E'MEDIAL: 8.79
LVEEAVG: 8.94
LVEEMED: 8.94
LVELAT: 10.3 cm/s
LVOT area: 3.05 cm2
LVOT diameter: 19.7 mm
LVOTSV: 70 mL
MV Peak grad: 3 mmHg
MV pk E vel: 92.1 m/s
MVPKAVEL: 124 m/s
Weight: 2160 oz

## 2017-07-25 LAB — TROPONIN I: Troponin I: <0.03 ng/mL (ref <0.03)

## 2017-07-25 LAB — CBC
HEMATOCRIT: 45.2 % (ref 36.0–46.0)
HEMOGLOBIN: 13.8 g/dL (ref 12.0–15.0)
MCH: 29.7 pg (ref 26.0–34.0)
MCHC: 30.5 g/dL (ref 30.0–36.0)
MCV: 97.4 fL (ref 78.0–100.0)
Platelets: 297 10*3/uL (ref 150–400)
RBC: 4.64 MIL/uL (ref 3.87–5.11)
RDW: 14.7 % (ref 11.5–15.5)
WBC: 8.8 10*3/uL (ref 4.0–10.5)

## 2017-07-25 LAB — COMPREHENSIVE METABOLIC PANEL
ALBUMIN: 3.3 g/dL — AB (ref 3.5–5.0)
ALK PHOS: 48 U/L (ref 38–126)
ALT: 17 U/L (ref 14–54)
ANION GAP: 9 (ref 5–15)
AST: 17 U/L (ref 15–41)
BUN: 17 mg/dL (ref 6–20)
CALCIUM: 9.1 mg/dL (ref 8.9–10.3)
CHLORIDE: 89 mmol/L — AB (ref 101–111)
CO2: 41 mmol/L — AB (ref 22–32)
Creatinine, Ser: 0.81 mg/dL (ref 0.44–1.00)
GFR calc Af Amer: >60 mL/min (ref >60)
GFR calc non Af Amer: >60 mL/min (ref >60)
GLUCOSE: 145 mg/dL — AB (ref 65–99)
POTASSIUM: 4.4 mmol/L (ref 3.5–5.1)
SODIUM: 139 mmol/L (ref 135–145)
Total Bilirubin: 0.3 mg/dL (ref 0.3–1.2)
Total Protein: 6.7 g/dL (ref 6.5–8.1)

## 2017-07-25 MED ORDER — NICOTINE 21 MG/24HR TD PT24
21.0000 mg | MEDICATED_PATCH | Freq: Every day | TRANSDERMAL | Status: DC
  Administered 2017-07-25 – 2017-07-29 (×5): 21 mg via TRANSDERMAL
  Filled 2017-07-25 (×5): qty 1

## 2017-07-25 MED ORDER — METOPROLOL TARTRATE 5 MG/5ML IV SOLN
5.0000 mg | Freq: Four times a day (QID) | INTRAVENOUS | Status: DC | PRN
  Administered 2017-07-25: 5 mg via INTRAVENOUS
  Filled 2017-07-25: qty 5

## 2017-07-25 NOTE — Progress Notes (Signed)
PT Cancellation Note  Patient Details Name: Harland DingwallVelma L Xia MRN: [id number] DOB: [birth year]   Cancelled Treatment:    Reason Eval/Treat Not Completed: Fatigue/lethargy limiting ability to participate(Pt refused  PT, pt stated she's fatigued and SOB at rest and cannot tolerate activity right now. Will attempt again tomorrow. )   Tamala SerUhlenberg, Mavrik Bynum Kistler 07/25/2017, 1:22 PM [phone]

## 2017-07-25 NOTE — Progress Notes (Signed)
  Echocardiogram 2D Echocardiogram has been performed.  Katie Waller, Katie Waller 07/25/2017, 9:37 AM

## 2017-07-25 NOTE — Progress Notes (Signed)
Patient ID: Katie DingwallVelma L Plaia, female   DOB: 1945/12/16, 71 y.o.   MRN: [id number]  PROGRESS NOTE    Katie DingwallVelma L Covin  WFU:[id number] DOB: 1945/12/16 DOA: 07/24/2017 PCP: Elias Elseeade, Robert, MD   Brief Narrative:  71 year old female with history of COPD, asthma, diabetes mellitus, hypertension, active tobacco use presented with worsening dyspnea.  She was admitted with COPD exacerbation.   Assessment & Plan:   Active Problems:   COPD exacerbation (HCC)   1. COPD exacerbation. -Continue IV Solu-Medrol at current doses along with Dulera and DuoNeb and Zithromax. -Continue oxygen supplementation and wean as able -If respiratory status does not improve, may need to get CAT scan   2. Hypertension.  -Monitor blood pressure.  Continue losartan and Lasix for now  3. Lower extremity edema.  -Chronic -Follow-up 2D echo.  Continue Lasix   4.   Chronic tobacco abuse -Counseled about tobacco cessation -Nicotine patch  DVT prophylaxis: Lovenox Code Status: Full Family Communication: Spoke to son and daughter at bedside Disposition Plan: Home in 1-2 days  Consultants: None  Procedures: None  Antimicrobials: Zithromax from 07/24/2017   Subjective: Patient seen and examined at bedside.  She is still coughing and short of breath.  No improvement since admission.  No overnight fever or vomiting.  Objective: Vitals:   07/24/17 2320 07/25/17 0330 07/25/17 0541 07/25/17 0808  BP:   (!) 123/59   Pulse:   (!) 113   Resp:   16   Temp:   98.2 F (36.8 C)   TempSrc:   Oral   SpO2: 97% 96% 100% 97%  Weight:      Height:       No intake or output data in the 24 hours ending 07/25/17 1055 Filed Weights   07/24/17 1011  Weight: 61.2 kg (135 lb)    Examination:  General exam: Appears ill looking and older than stated age but alert and awake Respiratory system: Bilateral decreased breath sound at bases with scattered wheezing Cardiovascular system: S1 & S2 heard, intermittently  tachycardic  gastrointestinal system: Abdomen is nondistended, soft and nontender. Normal bowel sounds heard. Extremities: No cyanosis, clubbing; 1+ edema   Data Reviewed: I have personally reviewed following labs and imaging studies  CBC: Recent Labs  Lab 07/24/17 1100 07/25/17 0054  WBC 13.5* 8.8  NEUTROABS 9.4*  --   HGB 13.9 13.8  HCT 43.9 45.2  MCV 95.0 97.4  PLT 340 297   Basic Metabolic Panel: Recent Labs  Lab 07/24/17 1100 07/25/17 0054  NA 139 139  K 3.4* 4.4  CL 89* 89*  CO2 40* 41*  GLUCOSE 120* 145*  BUN 18 17  CREATININE 0.78 0.81  CALCIUM 9.1 9.1   GFR: Estimated Creatinine Clearance: 55.8 mL/min (by C-G formula based on SCr of 0.81 mg/dL). Liver Function Tests: Recent Labs  Lab 07/24/17 1100 07/25/17 0054  AST 18 17  ALT 17 17  ALKPHOS 51 48  BILITOT 0.4 0.3  PROT 6.7 6.7  ALBUMIN 3.6 3.3*   No results for input(s): LIPASE, AMYLASE in the last 168 hours. No results for input(s): AMMONIA in the last 168 hours. Coagulation Profile: No results for input(s): INR, PROTIME in the last 168 hours. Cardiac Enzymes: Recent Labs  Lab 07/24/17 1100 07/24/17 1851 07/25/17 0054 07/25/17 0644  TROPONINI 0.03* <0.03 <0.03 <0.03   BNP (last 3 results) No results for input(s): PROBNP in the last 8760 hours. HbA1C: No results for input(s): HGBA1C in the last 72 hours. CBG: No  results for input(s): GLUCAP in the last 168 hours. Lipid Profile: No results for input(s): CHOL, HDL, LDLCALC, TRIG, CHOLHDL, LDLDIRECT in the last 72 hours. Thyroid Function Tests: No results for input(s): TSH, T4TOTAL, FREET4, T3FREE, THYROIDAB in the last 72 hours. Anemia Panel: No results for input(s): VITAMINB12, FOLATE, FERRITIN, TIBC, IRON, RETICCTPCT in the last 72 hours. Sepsis Labs: No results for input(s): PROCALCITON, LATICACIDVEN in the last 168 hours.  No results found for this or any previous visit (from the past 240 hour(s)).       Radiology  Studies: Dg Chest 2 View  Result Date: 07/24/2017 CLINICAL DATA:  Severe dyspnea, cough and chest congestion. Symptoms have worsened yesterday and today. History of COPD, current smoker. EXAM: CHEST  2 VIEW COMPARISON:  Chest x-ray of Jan 13, 2017 FINDINGS: The lungs remain hyperinflated with hemidiaphragm flattening. There is no focal infiltrate. There is no pleural effusion. The cardiac silhouette is top-normal in size. The pulmonary vascularity is not engorged. There is calcification in the wall of the thoracic aorta. There is no pleural effusion or pneumothorax. The bony thorax is unremarkable. IMPRESSION: COPD.  No acute pneumonia nor pulmonary edema. Thoracic aortic atherosclerosis. Electronically Signed   By: David  [place] M.D.   On: 07/24/2017 11:59        Scheduled Meds: . aspirin  81 mg Oral Daily  . azithromycin  500 mg Oral Daily  . enoxaparin (LOVENOX) injection  40 mg Subcutaneous Q24H  . furosemide  40 mg Oral Daily  . ipratropium-albuterol  3 mL Nebulization Q4H  . losartan  50 mg Oral Daily  . methylPREDNISolone (SOLU-MEDROL) injection  40 mg Intravenous Q8H  . mometasone-formoterol  2 puff Inhalation BID  . nicotine  21 mg Transdermal Daily  . raloxifene  60 mg Oral Daily  . sodium chloride flush  3 mL Intravenous Q12H   Continuous Infusions: . sodium chloride       LOS: 1 day        Glade LloydKshitiz Semaja Lymon, MD [facility] Pager [phone]  If 7PM-7AM, please contact night-coverage [email or web address] Password [email or web address] 07/25/2017, 10:55 AM

## 2017-07-26 DIAGNOSIS — J9602 Acute respiratory failure with hypercapnia: Secondary | ICD-10-CM

## 2017-07-26 LAB — MRSA PCR SCREENING: MRSA by PCR: NEGATIVE

## 2017-07-26 LAB — BASIC METABOLIC PANEL
ANION GAP: 7 (ref 5–15)
BUN: 20 mg/dL (ref 6–20)
CHLORIDE: 91 mmol/L — AB (ref 101–111)
CO2: 42 mmol/L — AB (ref 22–32)
Calcium: 9.1 mg/dL (ref 8.9–10.3)
Creatinine, Ser: 0.75 mg/dL (ref 0.44–1.00)
GFR calc non Af Amer: >60 mL/min (ref >60)
GLUCOSE: 132 mg/dL — AB (ref 65–99)
Potassium: 4.5 mmol/L (ref 3.5–5.1)
Sodium: 140 mmol/L (ref 135–145)

## 2017-07-26 LAB — BLOOD GAS, ARTERIAL
ACID-BASE EXCESS: 15 mmol/L — AB (ref 0.0–2.0)
Bicarbonate: 45.3 mmol/L — ABNORMAL HIGH (ref 20.0–28.0)
DRAWN BY: 331471
O2 CONTENT: 4 L/min
O2 SAT: 81.4 %
PATIENT TEMPERATURE: 37
pCO2 arterial: 92.7 mmHg (ref 32.0–48.0)
pH, Arterial: 7.31 — ABNORMAL LOW (ref 7.350–7.450)
pO2, Arterial: 49.6 mmHg — ABNORMAL LOW (ref 83.0–108.0)

## 2017-07-26 LAB — CBC WITH DIFFERENTIAL/PLATELET
BASOS ABS: 0 10*3/uL (ref 0.0–0.1)
BASOS PCT: 0 %
Eosinophils Absolute: 0 10*3/uL (ref 0.0–0.7)
Eosinophils Relative: 0 %
HEMATOCRIT: 44.7 % (ref 36.0–46.0)
HEMOGLOBIN: 13.1 g/dL (ref 12.0–15.0)
LYMPHS PCT: 4 %
Lymphs Abs: 0.6 10*3/uL — ABNORMAL LOW (ref 0.7–4.0)
MCH: 29.4 pg (ref 26.0–34.0)
MCHC: 29.3 g/dL — AB (ref 30.0–36.0)
MCV: 100.2 fL — AB (ref 78.0–100.0)
MONO ABS: 0.7 10*3/uL (ref 0.1–1.0)
MONOS PCT: 5 %
NEUTROS ABS: 12.8 10*3/uL — AB (ref 1.7–7.7)
NEUTROS PCT: 91 %
Platelets: 291 10*3/uL (ref 150–400)
RBC: 4.46 MIL/uL (ref 3.87–5.11)
RDW: 14.7 % (ref 11.5–15.5)
WBC: 14.1 10*3/uL — AB (ref 4.0–10.5)

## 2017-07-26 LAB — D-DIMER, QUANTITATIVE (NOT AT ARMC)

## 2017-07-26 LAB — MAGNESIUM: Magnesium: 2.3 mg/dL (ref 1.7–2.4)

## 2017-07-26 MED ORDER — ZOLPIDEM TARTRATE 5 MG PO TABS
5.0000 mg | ORAL_TABLET | Freq: Once | ORAL | Status: AC
  Administered 2017-07-26: 5 mg via ORAL
  Filled 2017-07-26: qty 1

## 2017-07-26 MED ORDER — METHYLPREDNISOLONE SODIUM SUCC 125 MG IJ SOLR
80.0000 mg | Freq: Three times a day (TID) | INTRAMUSCULAR | Status: DC
  Administered 2017-07-26 – 2017-08-01 (×18): 80 mg via INTRAVENOUS
  Filled 2017-07-26 (×18): qty 2

## 2017-07-26 MED ORDER — IPRATROPIUM-ALBUTEROL 0.5-2.5 (3) MG/3ML IN SOLN
3.0000 mL | Freq: Four times a day (QID) | RESPIRATORY_TRACT | Status: DC
  Administered 2017-07-26 – 2017-08-05 (×41): 3 mL via RESPIRATORY_TRACT
  Filled 2017-07-26 (×43): qty 3

## 2017-07-26 MED ORDER — BUDESONIDE 0.5 MG/2ML IN SUSP
0.5000 mg | Freq: Two times a day (BID) | RESPIRATORY_TRACT | Status: DC
  Administered 2017-07-26 – 2017-08-07 (×25): 0.5 mg via RESPIRATORY_TRACT
  Filled 2017-07-26 (×26): qty 2

## 2017-07-26 MED ORDER — TRAZODONE HCL 50 MG PO TABS
100.0000 mg | ORAL_TABLET | Freq: Every evening | ORAL | Status: DC | PRN
  Administered 2017-07-26 – 2017-08-05 (×5): 100 mg via ORAL
  Filled 2017-07-26 (×7): qty 2

## 2017-07-26 MED ORDER — ARFORMOTEROL TARTRATE 15 MCG/2ML IN NEBU
15.0000 ug | INHALATION_SOLUTION | Freq: Two times a day (BID) | RESPIRATORY_TRACT | Status: DC
  Administered 2017-07-26 – 2017-08-07 (×25): 15 ug via RESPIRATORY_TRACT
  Filled 2017-07-26 (×26): qty 2

## 2017-07-26 NOTE — Progress Notes (Signed)
OT Cancellation Note  Patient Details Name: Katie Waller MRN: [id number] DOB: 1945-12-05   Cancelled Treatment:    Reason Eval/Treat Not Completed: Medical issues which prohibited therapy(Pt with increased WOB, transferred to ICU. Will follow.)  Evern BioMayberry, Elisha Cooksey Lynn 07/26/2017, 1:24 PM  [phone]

## 2017-07-26 NOTE — Progress Notes (Addendum)
Patient ID: Katie Waller, female   DOB: 03-16-1946, 71 y.o.   MRN: [id number]  PROGRESS NOTE    Katie Waller  [id number] DOB: 03-16-1946 DOA: 07/24/2017 PCP: Elias Elseeade, Robert, MD   Brief Narrative:  71 year old female with history of COPD not adherent to prescribed home oxygen, asthma, diabetes mellitus, hypertension, active tobacco use presented with worsening dyspnea over the past month. CXR showed hyperinflation and diaphragm flattening with no infiltrate. She was admitted with COPD exacerbation. Echo showed EF 65-70%, G1DD with normal RV and PA caliber/pressures. Work of breathing remained elevated as of 11/21 despite IV steroids, azithromycin, and nebulizer treatments, prompting use of BiPAP and transfer to SDU. Pulmonology consulted.   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  - BiPAP prn - Appreciate PCCM involvement. Transferring to SDU  COPD exacerbation:  - Continue IV steroids, nebs scheduled and prn.  - Continue azithromycin for now. - Recommend PFTs as outpatient, possible referral to pulm.  Essential HTN: Chronic.  - Continue losartan, lasix.   Lower extremity edema: Chronic, improved.  - Continue Lasix   Tobacco use: >50 pack-years.  - Nicotine patch - Counseling for cessation provided.   DVT prophylaxis: Lovenox Code Status: Full, confirmed Family Communication: Daughter at bedside Disposition Plan: Transfer to SDU  Consultants: PCCM  Procedures: BiPAP 11/21  Antimicrobials: Azithromycin 11/19 >>  Subjective: She's "having a bad day, much worse than yesterday." With trouble catching breath at rest, kept her from sleeping well last night. Still coughing. No orthopnea.   Objective: Vitals:   07/26/17 0307 07/26/17 0500 07/26/17 1137 07/26/17 1221  BP:  131/76    Pulse:  (!) 108  (!) 128  Resp:  (!) 21  (!) 36  Temp:  98.2 F (36.8  C)    TempSrc:  Oral    SpO2: 98% 97% 98% 100%  Weight:      Height:        Intake/Output Summary (Last 24 hours) at 07/26/2017 1347 Last data filed at 07/26/2017 0900 Gross per 24 hour  Intake 120 ml  Output -  Net 120 ml   Filed Weights   07/24/17 1011  Weight: 61.2 kg (135 lb)    Examination:  General exam: 70yo F in respiratory distress Respiratory system: Tachypneic with supraclavicular retractions and prolonged expiration. Diminished throughout with scant end-expiratory wheezing.  Cardiovascular system: Regular tachycardia, no JVD. Trace LE edema.  Gastrointestinal system: Abdomen is nondistended, soft and nontender. Normal bowel sounds heard. Extremities: No cyanosis, clubbing; Trace edema   Data Reviewed: I have personally reviewed following labs and imaging studies  CBC: Recent Labs  Lab 07/24/17 1100 07/25/17 0054 07/26/17 0455  WBC 13.5* 8.8 14.1*  NEUTROABS 9.4*  --  12.8*  HGB 13.9 13.8 13.1  HCT 43.9 45.2 44.7  MCV 95.0 97.4 100.2*  PLT 340 297 291   Basic Metabolic Panel: Recent Labs  Lab 07/24/17 1100 07/25/17 0054 07/26/17 0455  NA 139 139 140  K 3.4* 4.4 4.5  CL 89* 89* 91*  CO2 40* 41* 42*  GLUCOSE 120* 145* 132*  BUN 18 17 20   CREATININE 0.78 0.81 0.75  CALCIUM 9.1 9.1 9.1  MG  --   --  2.3   GFR: Estimated Creatinine Clearance: 56.5 mL/min (by C-G formula based on SCr of 0.75 mg/dL). Liver Function Tests: Recent Labs  Lab 07/24/17 1100 07/25/17 0054  AST 18 17  ALT 17 17  ALKPHOS 51 48  BILITOT 0.4 0.3  PROT 6.7 6.7  ALBUMIN 3.6 3.3*   No results for input(s): LIPASE, AMYLASE in the last 168 hours. No results for input(s): AMMONIA in the last 168 hours. Coagulation Profile: No results for input(s): INR, PROTIME in the last 168 hours. Cardiac Enzymes: Recent Labs  Lab 07/24/17 1100 07/24/17 1851 07/25/17 0054 07/25/17 0644  TROPONINI 0.03* <0.03 <0.03 <0.03   Scheduled Meds: . arformoterol  15 mcg Nebulization  BID  . aspirin  81 mg Oral Daily  . azithromycin  500 mg Oral Daily  . budesonide (PULMICORT) nebulizer solution  0.5 mg Nebulization BID  . enoxaparin (LOVENOX) injection  40 mg Subcutaneous Q24H  . furosemide  40 mg Oral Daily  . ipratropium-albuterol  3 mL Nebulization Q6H  . losartan  50 mg Oral Daily  . methylPREDNISolone (SOLU-MEDROL) injection  80 mg Intravenous Q8H  . nicotine  21 mg Transdermal Daily  . raloxifene  60 mg Oral Daily  . sodium chloride flush  3 mL Intravenous Q12H   Continuous Infusions: . sodium chloride       LOS: 2 days   Time spent: 35 minutes.  Hazeline Junkeryan Derryck Shahan, MD [facility] Pager [phone]   If 7PM-7AM, please contact night-coverage [email or web address] Password [email or web address] 07/26/2017, 1:47 PM

## 2017-07-26 NOTE — Care Management Note (Signed)
Case Management Note  Patient Details  Name: Katie Waller MRN: [id number] Date of Birth: 11-04-1945  Subjective/Objective: 71 y/o f admitted w/COPD exac. From home. PT cons-will come back to assess.                   Action/Plan:d/c plan home.   Expected Discharge Date:  (unknown)               Expected Discharge Plan:  Home/Self Care  In-House Referral:     Discharge planning Services  CM Consult  Post Acute Care Choice:    Choice offered to:     DME Arranged:    DME Agency:     HH Arranged:    HH Agency:     Status of Service:  In process, will continue to follow  If discussed at Long Length of Stay Meetings, dates discussed:    Additional Comments:  Lanier ClamMahabir, Gid Schoffstall, RN 07/26/2017, 11:26 AM

## 2017-07-26 NOTE — Progress Notes (Signed)
PT Cancellation Note  Patient Details Name: Harland DingwallVelma L Donaghey MRN: [id number] DOB: 08-10-1946   Cancelled Treatment:    Reason Eval/Treat Not Completed: Medical issues which prohibited therapy(pt with increased respiratory rate and work of breathing at rest in bed at present, pt stated she's not able to tolerate activity right now. Will follow. )   Tamala SerUhlenberg, Afsana Liera Kistler 07/26/2017, 10:32 AM [phone]

## 2017-07-26 NOTE — Progress Notes (Signed)
Patient breathing increasingly labored and diminished.  Respiratory therapy called.  Breathing treatment given with no relief.  Pt agreeable to going on bipap.  Orders placed for ICU bed, pt transferred down to room 1240.  Report given to [facility] AssociationKelly, Charity fundraiserN.
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
## 2017-07-27 ENCOUNTER — Inpatient Hospital Stay (HOSPITAL_COMMUNITY): Payer: Medicare Other

## 2017-07-27 LAB — BASIC METABOLIC PANEL
ANION GAP: 8 (ref 5–15)
BUN: 22 mg/dL — ABNORMAL HIGH (ref 6–20)
CALCIUM: 8.8 mg/dL — AB (ref 8.9–10.3)
CO2: 42 mmol/L — ABNORMAL HIGH (ref 22–32)
Chloride: 88 mmol/L — ABNORMAL LOW (ref 101–111)
Creatinine, Ser: 0.72 mg/dL (ref 0.44–1.00)
Glucose, Bld: 149 mg/dL — ABNORMAL HIGH (ref 65–99)
POTASSIUM: 4 mmol/L (ref 3.5–5.1)
Sodium: 138 mmol/L (ref 135–145)

## 2017-07-27 NOTE — Progress Notes (Signed)
Per MD- utilize BiPAP intermittently (if able). Discovered PT off BiPAP at approx 1035- placed PT back on BiPAP for WOB and PT request at approx 1038.

## 2017-07-27 NOTE — Progress Notes (Signed)
Patient ID: Katie DingwallVelma L Waller, female   DOB: 05-Jan-1946, 71 y.o.   MRN: [id number]  PROGRESS NOTE    Katie DingwallVelma L Hooper  [id number] DOB: 05-Jan-1946 DOA: 07/24/2017 PCP: Elias Elseeade, Robert, MD   Brief Narrative:  71 year old female with history of COPD not adherent to prescribed home oxygen, asthma, diabetes mellitus, hypertension, active tobacco use presented with worsening dyspnea over the past month. CXR showed hyperinflation and diaphragm flattening with no infiltrate. She was admitted with COPD exacerbation. Echo showed EF 65-70%, G1DD with normal RV and PA caliber/pressures. Work of breathing remained elevated as of 11/21 despite IV steroids, azithromycin, and nebulizer treatments, prompting use of BiPAP and transfer to SDU. Pulmonology consulted. Use of BiPAP has continued.  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  - BiPAP prn, intermittently to facilitate rest of respiratory muscles - Appreciate PCCM involvement. Confirmed full code status.   COPD exacerbation:  - Continue IV steroids, nebs scheduled and prn.  - Continue azithromycin for now. - Recommend PFTs as outpatient, pulmonology to follow  Essential HTN: Chronic.  - Continue losartan, lasix.   Lower extremity edema: Chronic, improved.  - Continue lasix   Tobacco use: >50 pack-years.  - Nicotine patch - Counseling for cessation provided.   DVT prophylaxis: Lovenox Code Status: Full, confirmed Family Communication: Daughter at bedside Disposition Plan: Continue SDU while needing BiPAP intermittently.  Consultants: PCCM  Procedures: BiPAP 11/21 Echocardiogram 11/20: - Left ventricle: The cavity size was normal. Systolic function was   vigorous. The estimated ejection fraction was in the range of 65%   to 70%. Wall motion was normal; there were no regional wall   motion abnormalities. Doppler parameters  are consistent with   abnormal left ventricular relaxation (grade 1 diastolic   dysfunction). - Aortic valve: There was mild regurgitation. - Right ventricle:  The cavity size was normal. Wall thickness was normal. Systolic function was normal. - Pulmonary artery:   The main pulmonary artery was normal-sized. Systolic pressure was within the normal range. - Right atrium:  The atrium was normal in size. - Inferior vena cava: The vessel was normal in size.  Antimicrobials: Azithromycin 11/19 - 11/23  Subjective: Needed BiPAP overnight, but feels like it's smothering her at times, so not sleeping well. Her daughter says it does seem to help her a lot. She denies orthopnea or chest pain.  Objective: Vitals:   07/27/17 1042 07/27/17 1100 07/27/17 1200 07/27/17 1300  BP: (!) 107/57 139/69 (!) 123/53 (!) 143/73  Pulse: (!) 124 (!) 119 (!) 121 (!) 118  Resp: (!) 24 (!) 27 (!) 27 (!) 27  Temp:      TempSrc:      SpO2: 94% 94% 93% 96%  Weight:      Height:       General exam: 70yo F in no acute distress Respiratory system: Tachypneic, nonlabored with BiPAP in place. Poor air movement with expiratory wheezing.  Cardiovascular system: Regular tachycardia, no JVD.  Gastrointestinal system: Abdomen is nondistended, soft and nontender. Normal bowel sounds heard. Extremities: No cyanosis, clubbing; Trace edema dependently.  Data Reviewed: I have personally reviewed following labs and imaging studies  CBC: Recent Labs  Lab 07/24/17 1100 07/25/17 0054 07/26/17 0455  WBC 13.5* 8.8 14.1*  NEUTROABS 9.4*  --  12.8*  HGB 13.9 13.8 13.1  HCT 43.9 45.2 44.7  MCV 95.0 97.4 100.2*  PLT 340 297 291  Basic Metabolic Panel: Recent Labs  Lab 07/24/17 1100 07/25/17 0054 07/26/17 0455 07/27/17 0346  NA 139 139 140 138  K 3.4* 4.4 4.5 4.0  CL 89* 89* 91* 88*  CO2 40* 41* 42* 42*  GLUCOSE 120* 145* 132* 149*  BUN 18 17 20  22*  CREATININE 0.78 0.81 0.75 0.72  CALCIUM 9.1 9.1 9.1 8.8*    MG  --   --  2.3  --    GFR: Estimated Creatinine Clearance: 56.5 mL/min (by C-G formula based on SCr of 0.72 mg/dL). Liver Function Tests: Recent Labs  Lab 07/24/17 1100 07/25/17 0054  AST 18 17  ALT 17 17  ALKPHOS 51 48  BILITOT 0.4 0.3  PROT 6.7 6.7  ALBUMIN 3.6 3.3*   No results for input(s): LIPASE, AMYLASE in the last 168 hours. No results for input(s): AMMONIA in the last 168 hours. Coagulation Profile: No results for input(s): INR, PROTIME in the last 168 hours. Cardiac Enzymes: Recent Labs  Lab 07/24/17 1100 07/24/17 1851 07/25/17 0054 07/25/17 0644  TROPONINI 0.03* <0.03 <0.03 <0.03   Scheduled Meds: . arformoterol  15 mcg Nebulization BID  . aspirin  81 mg Oral Daily  . azithromycin  500 mg Oral Daily  . budesonide (PULMICORT) nebulizer solution  0.5 mg Nebulization BID  . enoxaparin (LOVENOX) injection  40 mg Subcutaneous Q24H  . furosemide  40 mg Oral Daily  . ipratropium-albuterol  3 mL Nebulization Q6H  . losartan  50 mg Oral Daily  . methylPREDNISolone (SOLU-MEDROL) injection  80 mg Intravenous Q8H  . nicotine  21 mg Transdermal Daily  . raloxifene  60 mg Oral Daily  . sodium chloride flush  3 mL Intravenous Q12H   Continuous Infusions: . sodium chloride       LOS: 3 days   Time spent: 35 minutes.  Hazeline Junkeryan Kewanda Poland, MD [facility] Pager [phone]   If 7PM-7AM, please contact night-coverage [email or web address] Password [email or web address] 07/27/2017, 2:20 PM

## 2017-07-27 NOTE — Progress Notes (Signed)
Placed PT on BiPAP at approximately 0815.

## 2017-07-27 NOTE — Progress Notes (Signed)
Entered room to give HHN txs and found pt. On bipap machine.  HHN given via bipap with T in place.

## 2017-07-27 NOTE — Progress Notes (Signed)
PT states she utilized BiPAP all night. PT states she is breathing fine at this time. Sp02 on 3 lpm [place] 96% (reduced to 2 lpm [place] post neb- MD goal 88-94%- RN aware), BBS diminished, RR 21, HR 109. RN aware.

## 2017-07-28 ENCOUNTER — Inpatient Hospital Stay (HOSPITAL_COMMUNITY): Payer: Medicare Other

## 2017-07-28 DIAGNOSIS — I5032 Chronic diastolic (congestive) heart failure: Secondary | ICD-10-CM | POA: Diagnosis present

## 2017-07-28 DIAGNOSIS — J9601 Acute respiratory failure with hypoxia: Secondary | ICD-10-CM

## 2017-07-28 HISTORY — DX: Chronic diastolic (congestive) heart failure: I50.32

## 2017-07-28 MED ORDER — FUROSEMIDE 10 MG/ML IJ SOLN
40.0000 mg | Freq: Four times a day (QID) | INTRAMUSCULAR | Status: AC
  Administered 2017-07-28 (×2): 40 mg via INTRAVENOUS
  Filled 2017-07-28 (×2): qty 4

## 2017-07-28 MED ORDER — LORAZEPAM 0.5 MG PO TABS: 0.5000 mg | ORAL_TABLET | Freq: Four times a day (QID) | ORAL | Status: DC | PRN

## 2017-07-28 NOTE — Progress Notes (Signed)
Pt taking break from BiPAP at this time.  Currently resting comfortably on 2Lpm [place].  Pt willing to go back on BiPAP when needed.

## 2017-07-28 NOTE — Progress Notes (Signed)
Patient ID: Katie Waller, female   DOB: August 24, 1946, 71 y.o.   MRN: [id number]  PROGRESS NOTE    Katie Waller  [id number] DOB: August 24, 1946 DOA: 07/24/2017 PCP: Elias Elseeade, Robert, MD   Brief Narrative:  71 year old female with history of COPD not adherent to prescribed home oxygen, asthma, diabetes mellitus, hypertension, active tobacco use presented with worsening dyspnea over the past month. CXR showed hyperinflation and diaphragm flattening with no infiltrate. She was admitted with COPD exacerbation. Echo showed EF 65-70%, G1DD with normal RV and PA caliber/pressures. Work of breathing remained elevated as of 11/21 despite IV steroids, azithromycin, and nebulizer treatments, prompting use of BiPAP and transfer to SDU. Pulmonology consulted. Use of BiPAP has continued.  Assessment & Plan:   Principal Problem:   COPD exacerbation (HCC) Active Problems:   Smoker   Sinus tachycardia   Essential hypertension   Acute respiratory failure (HCC)  Acute on chronic hypoxic/hypercapneic respiratory failure: Decompensated from baseline.  - Goal SpO2 88-94% - BiPAP prn, intermittently to facilitate rest of respiratory muscles. Pulm recommending on/off q4h - Appreciate PCCM involvement. Confirmed full code status. May require intubation depending on progress today.  COPD exacerbation:  - Continue IV steroids, nebs scheduled and prn.  - Recommend PFTs as outpatient, pulmonology to follow  Essential HTN: Chronic.  - Continue losartan, lasix.   Chronic diastolic CHF: Does not appear volume overloaded, but improved breathing with diuresis. - Continue lasix, augment to IV BID today. - Monitor UOP, BMP  Tobacco use: ~55 pack-years.  - Nicotine patch - Counseling for cessation provided.   DVT prophylaxis: Lovenox Code Status: Full, confirmed Family Communication: At bedside Disposition Plan: Continue SDU.  Consultants: PCCM  Procedures: BiPAP 11/21 Echocardiogram 07/25/2017: - Left  ventricle: The cavity size was normal. Systolic function was   vigorous. The estimated ejection fraction was in the range of 65%   to 70%. Wall motion was normal; there were no regional wall   motion abnormalities. Doppler parameters are consistent with   abnormal left ventricular relaxation (grade 1 diastolic   dysfunction). - Aortic valve: There was mild regurgitation. - Right ventricle:  The cavity size was normal. Wall thickness was normal. Systolic function was normal. - Pulmonary artery:   The main pulmonary artery was normal-sized. Systolic pressure was within the normal range. - Right atrium:  The atrium was normal in size. - Inferior vena cava: The vessel was normal in size.  Antimicrobials: Azithromycin 11/19 - 11/23  Subjective: Worse this morning after some initial improvement, required BiPAP much of the night. Denies chest pain or worsened leg swelling.   Objective: Vitals:   07/28/17 0800 07/28/17 0858 07/28/17 1151 07/28/17 1200  BP: (!) 118/58   (!) 129/46  Pulse: 93  (!) 126 (!) 120  Resp: (!) 22  (!) 24 (!) 23  Temp: 98 F (36.7 C)   98.2 F (36.8 C)  TempSrc: Axillary   Oral  SpO2: 98% 97% 98% 98%  Weight:      Height:       General exam: 70yo F in mild respiratory distress when off BiPAP distress Respiratory system: Tachypneic, labored, diminished aeration without wheezes or crackles.  Cardiovascular system: Regular tachycardia, no JVD.  Gastrointestinal system: Abdomen is nondistended, soft and nontender. Normal bowel sounds heard. Extremities: No cyanosis, clubbing; Trace edema dependently symmetric. Neuro: Alert, oriented, appropriate, nonfocal  Data Reviewed: I have personally reviewed following labs and imaging studies  CBC: Recent Labs  Lab 07/24/17 1100 07/25/17 0054 07/26/17  0455  WBC 13.5* 8.8 14.1*  NEUTROABS 9.4*  --  12.8*  HGB 13.9 13.8 13.1  HCT 43.9 45.2 44.7  MCV 95.0 97.4 100.2*  PLT 340 297 291   Basic Metabolic  Panel: Recent Labs  Lab 07/24/17 1100 07/25/17 0054 07/26/17 0455 07/27/17 0346  NA 139 139 140 138  K 3.4* 4.4 4.5 4.0  CL 89* 89* 91* 88*  CO2 40* 41* 42* 42*  GLUCOSE 120* 145* 132* 149*  BUN 18 17 20  22*  CREATININE 0.78 0.81 0.75 0.72  CALCIUM 9.1 9.1 9.1 8.8*  MG  --   --  2.3  --    GFR: Estimated Creatinine Clearance: 56.5 mL/min (by C-G formula based on SCr of 0.72 mg/dL). Liver Function Tests: Recent Labs  Lab 07/24/17 1100 07/25/17 0054  AST 18 17  ALT 17 17  ALKPHOS 51 48  BILITOT 0.4 0.3  PROT 6.7 6.7  ALBUMIN 3.6 3.3*   No results for input(s): LIPASE, AMYLASE in the last 168 hours. No results for input(s): AMMONIA in the last 168 hours. Coagulation Profile: No results for input(s): INR, PROTIME in the last 168 hours. Cardiac Enzymes: Recent Labs  Lab 07/24/17 1100 07/24/17 1851 07/25/17 0054 07/25/17 0644  TROPONINI 0.03* <0.03 <0.03 <0.03   Scheduled Meds: . arformoterol  15 mcg Nebulization BID  . aspirin  81 mg Oral Daily  . budesonide (PULMICORT) nebulizer solution  0.5 mg Nebulization BID  . enoxaparin (LOVENOX) injection  40 mg Subcutaneous Q24H  . furosemide  40 mg Intravenous Q6H  . ipratropium-albuterol  3 mL Nebulization Q6H  . losartan  50 mg Oral Daily  . methylPREDNISolone (SOLU-MEDROL) injection  80 mg Intravenous Q8H  . nicotine  21 mg Transdermal Daily  . raloxifene  60 mg Oral Daily  . sodium chloride flush  3 mL Intravenous Q12H   Continuous Infusions: . sodium chloride       LOS: 4 days   Time spent: 35 minutes.  Hazeline Junkeryan Jafari Mckillop, MD [facility] Pager [phone]   If 7PM-7AM, please contact night-coverage [email or web address] Password [email or web address] 07/28/2017, 2:12 PM

## 2017-07-28 NOTE — Progress Notes (Signed)
Date: July 28, 2017 Marcelle SmilingRhonda Marketia Stallsmith, BSN, [place]ek[place]RN3, [place]CCM  [phone] Chart and notes review for patient progress and needs. BIPAP Will follow for case management and discharge needs. Next review date: [id number]

## 2017-07-28 NOTE — Progress Notes (Signed)
OT Cancellation Note  Patient Details Name: Katie DingwallVelma L Bourquin MRN: [id number] DOB: 04-30-1946   Cancelled Treatment:    Reason Eval/Treat Not Completed: Medical issues which prohibited therapy. Pt is back on bipap. Will try to check back this weekend, if schedule permits.  Tryone Kille 07/28/2017, 1:23 PM  Marica OtterMaryellen Rooney Gladwin, OTR/L [phone] 07/28/2017

## 2017-07-28 NOTE — Progress Notes (Signed)
Pt. Requested to come off of bipap at this time and attempt to sleep on [place].  Will place on [place] 3 lpm as tolerated per request.

## 2017-07-28 NOTE — Progress Notes (Signed)
PT Cancellation Note  Patient Details Name: Harland DingwallVelma L Kanady MRN: [id number] DOB: [birth year]   Cancelled Treatment:     PT order received but eval deferred at request of RN - pt continues on bipap.  Will follow.   Lankford Gutzmer 07/28/2017, 1:25 PM

## 2017-07-28 NOTE — Progress Notes (Signed)
   Name: Katie Waller MRN: [id number] DOB: [birth year]    ADMISSION DATE:  07/24/2017 CONSULTATION DATE:  07/26/17  REFERRING MD :  Katie Waller / [facility]   CHIEF COMPLAINT:  Dyspnea   BRIEF HISTORY OF PRESENT ILLNESS:  71 y/o F, smoker (55 years, 1ppd), who presented to [facility] on 11/19 with acute on chronic respiratory failure with hypoxemia due to COPD exacerbation.   SUBJECTIVE:  Breathing had improved, but now worse again Yesterday was off BIPAP most of day Today feels more tight, using BIPAP more during daytime Has dry cough, no mucus production No leg swelling   VITAL SIGNS: Temp:  [97.6 F (36.4 C)-98.2 F (36.8 C)] 98.2 F (36.8 C) (11/23 1200) Pulse Rate:  [93-126] 120 (11/23 1200) Resp:  [17-28] 23 (11/23 1200) BP: (110-170)/(44-95) 129/46 (11/23 1200) SpO2:  [92 %-100 %] 98 % (11/23 1200) FiO2 (%):  [25 %-30 %] 25 % (11/23 1151)  PHYSICAL EXAMINATION:  General:  Resting comfortably on BIPAP HENT: NCAT OP clear PULM: Poor air movement B, BIPAP supported breathing CV: Tachy but regular, no mgr, no JVD GI: BS+, soft, nontender MSK: normal bulk and tone Neuro: awake, alert, no distress, MAEW    Recent Labs  Lab 07/25/17 0054 07/26/17 0455 07/27/17 0346  NA 139 140 138  K 4.4 4.5 4.0  CL 89* 91* 88*  CO2 41* 42* 42*  BUN 17 20 22*  CREATININE 0.81 0.75 0.72  GLUCOSE 145* 132* 149*    Recent Labs  Lab 07/24/17 1100 07/25/17 0054 07/26/17 0455  HGB 13.9 13.8 13.1  HCT 43.9 45.2 44.7  WBC 13.5* 8.8 14.1*  PLT 340 297 291    Dg Chest Port 1 View  Result Date: 07/27/2017 CLINICAL DATA:  Respiratory failure EXAM: PORTABLE CHEST 1 VIEW COMPARISON:  July 24, 2017 FINDINGS: Lungs appear slightly hyperexpanded. No evident edema or consolidation. Heart size and pulmonary vascularity are normal. No adenopathy. There is aortic atherosclerosis. There is a bone island at the level of the inferior glenoid on the right. IMPRESSION: Lungs mildly hyperexpanded  without edema or consolidation. Stable cardiac silhouette. There is aortic atherosclerosis. Aortic Atherosclerosis (ICD10-I70.0). Electronically Signed   By: Bretta BangWilliam  Woodruff Waller M.D.   On: 07/27/2017 07:14      SIGNIFICANT EVENTS  11/19  Admit with SOB, suspected COPD exacerbation  STUDIES:  ECHO 11/20 >> LVEF 65-70%, no RWMA, grade 1 diastolic dysfunction   ASSESSMENT / PLAN:  COPD exacerbation Severe centrilobular emphysema Tobacco abuse, ongoing Acute on chronic respiratory failure with hypercapnea Acute diastolic heart failure, appears improved but will diurese again as she did well with this yesterday  Plan: Goal O2 saturation 88-94%, use this target for supplemental O2 Continue brovana/pulmicort Use BIPAP 4 hours on, 4 off today Monitor closely in ICU setting, may need intubation Continue solumedrol at current dosing CXR now Change lasix to IV dosing x2 doses  My cc time 30 minutes  Katie CarolinaBrent Leondre Taul, MD [facility] Pager: [phone] Cell: [phone] After 3pm or if no response, call [phone]

## 2017-07-29 LAB — BASIC METABOLIC PANEL
Anion gap: 11 (ref 5–15)
BUN: 34 mg/dL — AB (ref 6–20)
CHLORIDE: 89 mmol/L — AB (ref 101–111)
CO2: 42 mmol/L — ABNORMAL HIGH (ref 22–32)
CREATININE: 0.76 mg/dL (ref 0.44–1.00)
Calcium: 8.9 mg/dL (ref 8.9–10.3)
GFR calc Af Amer: >60 mL/min (ref >60)
GLUCOSE: 166 mg/dL — AB (ref 65–99)
Potassium: 3.4 mmol/L — ABNORMAL LOW (ref 3.5–5.1)
SODIUM: 142 mmol/L (ref 135–145)

## 2017-07-29 LAB — CBC
HCT: 43.7 % (ref 36.0–46.0)
Hemoglobin: 13.8 g/dL (ref 12.0–15.0)
MCH: 29.7 pg (ref 26.0–34.0)
MCHC: 31.6 g/dL (ref 30.0–36.0)
MCV: 94.2 fL (ref 78.0–100.0)
PLATELETS: 309 10*3/uL (ref 150–400)
RBC: 4.64 MIL/uL (ref 3.87–5.11)
RDW: 14.3 % (ref 11.5–15.5)
WBC: 8.7 10*3/uL (ref 4.0–10.5)

## 2017-07-29 MED ORDER — POTASSIUM CHLORIDE CRYS ER 20 MEQ PO TBCR
20.0000 meq | EXTENDED_RELEASE_TABLET | Freq: Once | ORAL | Status: AC
  Administered 2017-07-29: 20 meq via ORAL
  Filled 2017-07-29: qty 1

## 2017-07-29 MED ORDER — LABETALOL HCL 5 MG/ML IV SOLN
10.0000 mg | INTRAVENOUS | Status: DC | PRN
  Administered 2017-08-01 – 2017-08-03 (×6): 10 mg via INTRAVENOUS
  Filled 2017-07-29 (×7): qty 4

## 2017-07-29 MED ORDER — FUROSEMIDE 40 MG PO TABS
40.0000 mg | ORAL_TABLET | Freq: Every day | ORAL | Status: DC
  Administered 2017-07-29 – 2017-07-31 (×3): 40 mg via ORAL
  Filled 2017-07-29 (×4): qty 1

## 2017-07-29 MED ORDER — LORAZEPAM 1 MG PO TABS
1.0000 mg | ORAL_TABLET | Freq: Four times a day (QID) | ORAL | Status: DC | PRN
  Administered 2017-07-29 – 2017-08-07 (×16): 1 mg via ORAL
  Filled 2017-07-29 (×17): qty 1

## 2017-07-29 MED ORDER — VITAMINS A & D EX OINT
TOPICAL_OINTMENT | CUTANEOUS | Status: AC
  Administered 2017-07-29: 10:00:00
  Filled 2017-07-29: qty 5

## 2017-07-29 NOTE — Progress Notes (Signed)
Patient trialed off Bipap to attempt breakfast. Patient did not eat and felt too uncomfortable to eat, labored breathing increased and BiPap placed back on patient. Trial lasted 22 minutes and patient unable to tolerate PO pills. MD paged.

## 2017-07-29 NOTE — Progress Notes (Signed)
Patient ID: Katie Waller, female   DOB: September 29, 1945, 71 y.o.   MRN: [id number]  PROGRESS NOTE    Katie Waller  NWG:[id number] DOB: September 29, 1945 DOA: 07/24/2017 PCP: Elias Elseeade, Robert, MD   Brief Narrative:  71 year old female with history of COPD not adherent to prescribed home oxygen, asthma, diabetes mellitus, hypertension, active tobacco use presented with worsening dyspnea over the past month. CXR showed hyperinflation and diaphragm flattening with no infiltrate. She was admitted with COPD exacerbation. Echo showed EF 65-70%, G1DD with normal RV and PA caliber/pressures. Work of breathing remained elevated as of 11/21 despite IV steroids, azithromycin, and nebulizer treatments, prompting use of BiPAP and transfer to SDU. Pulmonology consulted. Use of BiPAP has continued without significant improvement.  Assessment & Plan:   Principal Problem:   COPD exacerbation (HCC) Active Problems:   Smoker   Sinus tachycardia   Essential hypertension   Acute respiratory failure (HCC)   Chronic diastolic CHF (congestive heart failure) (HCC)  Acute on chronic hypoxic/hypercapneic respiratory failure: Decompensated from baseline.  - Goal SpO2 88-94% - BiPAP prn, intermittently to facilitate rest of respiratory muscles. Pulm recommending on/off q4h. Will provide lorazepam prn anxiety w/BiPAP. - Appreciate PCCM involvement. Confirmed full code status, would be a difficult extubation.   COPD exacerbation:  - Continue IV steroids, nebs scheduled and prn.  - Recommend PFTs as outpatient, pulmonology to follow  Essential HTN: Chronic.  - Continue losartan, lasix.   Chronic diastolic CHF: Does not appear volume overloaded. - Continue lasix, augmented yesterday, Cr stable. Will order home lasix today. - Monitor UOP, BMP.  Tobacco use: ~55 pack-years.  - Nicotine patch - Counseling for cessation provided.   DVT prophylaxis: Lovenox Code Status: Full, confirmed Family Communication: At bedside  this AM Disposition Plan: Continue SDU.  Consultants: PCCM  Procedures: BiPAP 11/21 - 11/24 Echocardiogram 07/25/2017: - Left ventricle: The cavity size was normal. Systolic function was   vigorous. The estimated ejection fraction was in the range of 65%   to 70%. Wall motion was normal; there were no regional wall   motion abnormalities. Doppler parameters are consistent with   abnormal left ventricular relaxation (grade 1 diastolic   dysfunction). - Aortic valve: There was mild regurgitation. - Right ventricle:  The cavity size was normal. Wall thickness was normal. Systolic function was normal. - Pulmonary artery:   The main pulmonary artery was normal-sized. Systolic pressure was within the normal range. - Right atrium:  The atrium was normal in size. - Inferior vena cava: The vessel was normal in size.  Antimicrobials: Azithromycin 11/19 - 11/23  Subjective: Could not be off BiPAP for more than 30 minutes this morning. Having intermittent chest tightness and worsening anxiety, worse with breathing treatments.   Objective: Vitals:   07/29/17 0815 07/29/17 0821 07/29/17 0900 07/29/17 0922  BP:      Pulse:   (!) 109 (!) 116  Resp:   (!) 27 (!) 25  Temp:      TempSrc:      SpO2: 100% 100% 91% 94%  Weight:      Height:       General exam: 70yo F in no distress on BiPAP Respiratory system: Tachypneic, diminished aeration without wheezes or crackles.  Cardiovascular system: Regular tachycardia, no JVD.  Gastrointestinal system: Abdomen is nondistended, soft and nontender. Normal bowel sounds heard. Extremities: No cyanosis, clubbing; Trace edema dependently symmetric. Neuro: Alert, oriented, appropriate, nonfocal  Data Reviewed: I have personally reviewed following labs and imaging studies  CBC: Recent Labs  Lab 07/24/17 1100 07/25/17 0054 07/26/17 0455 07/29/17 0252  WBC 13.5* 8.8 14.1* 8.7  NEUTROABS 9.4*  --  12.8*  --   HGB 13.9 13.8 13.1 13.8  HCT 43.9 45.2  44.7 43.7  MCV 95.0 97.4 100.2* 94.2  PLT 340 297 291 309   Basic Metabolic Panel: Recent Labs  Lab 07/24/17 1100 07/25/17 0054 07/26/17 0455 07/27/17 0346 07/29/17 0252  NA 139 139 140 138 142  K 3.4* 4.4 4.5 4.0 3.4*  CL 89* 89* 91* 88* 89*  CO2 40* 41* 42* 42* 42*  GLUCOSE 120* 145* 132* 149* 166*  BUN 18 17 20  22* 34*  CREATININE 0.78 0.81 0.75 0.72 0.76  CALCIUM 9.1 9.1 9.1 8.8* 8.9  MG  --   --  2.3  --   --    GFR: Estimated Creatinine Clearance: 56.5 mL/min (by C-G formula based on SCr of 0.76 mg/dL). Liver Function Tests: Recent Labs  Lab 07/24/17 1100 07/25/17 0054  AST 18 17  ALT 17 17  ALKPHOS 51 48  BILITOT 0.4 0.3  PROT 6.7 6.7  ALBUMIN 3.6 3.3*   No results for input(s): LIPASE, AMYLASE in the last 168 hours. No results for input(s): AMMONIA in the last 168 hours. Coagulation Profile: No results for input(s): INR, PROTIME in the last 168 hours. Cardiac Enzymes: Recent Labs  Lab 07/24/17 1100 07/24/17 1851 07/25/17 0054 07/25/17 0644  TROPONINI 0.03* <0.03 <0.03 <0.03   Scheduled Meds: . arformoterol  15 mcg Nebulization BID  . aspirin  81 mg Oral Daily  . budesonide (PULMICORT) nebulizer solution  0.5 mg Nebulization BID  . enoxaparin (LOVENOX) injection  40 mg Subcutaneous Q24H  . ipratropium-albuterol  3 mL Nebulization Q6H  . losartan  50 mg Oral Daily  . methylPREDNISolone (SOLU-MEDROL) injection  80 mg Intravenous Q8H  . nicotine  21 mg Transdermal Daily  . potassium chloride  20 mEq Oral Once  . raloxifene  60 mg Oral Daily  . sodium chloride flush  3 mL Intravenous Q12H  . vitamin A & D       Continuous Infusions: . sodium chloride       LOS: 5 days   Time spent: 35 minutes.  Hazeline Junkeryan Hadley Soileau, MD [facility] Pager [phone]   If 7PM-7AM, please contact night-coverage [email or web address] Password [email or web address] 07/29/2017, 9:54 AM

## 2017-07-29 NOTE — Progress Notes (Signed)
   Name: Harland DingwallVelma L Kassin MRN: [id number] DOB: 23-Feb-1946    ADMISSION DATE:  07/24/2017 CONSULTATION DATE:  07/26/17  REFERRING MD :  Dr. Jarvis NewcomerGrunz / [facility]   CHIEF COMPLAINT:  Dyspnea   BRIEF HISTORY OF PRESENT ILLNESS:  71 y/o F, smoker (55 years, 1ppd), who [facility] with acute on chronic respiratory failure with hypoxemia due to COPD exacerbation.   SUBJECTIVE:  Off NIMV 21 min then failed   VITAL SIGNS: Temp:  [97.8 F (36.6 C)-98.5 F (36.9 C)] 98.4 F (36.9 C) (11/24 0800) Pulse Rate:  [96-126] 116 (11/24 0922) Resp:  [18-27] 25 (11/24 0922) BP: (120-159)/(46-79) 159/75 (11/24 0800) SpO2:  [91 %-100 %] 94 % (11/24 0922) FiO2 (%):  [25 %] 25 % (11/24 0821)  PHYSICAL EXAMINATION:  General: mod distress, does speak full sentences Neuro: awake, o x 3 HEENT: jvd up PULM: poor air entry diffuse, some UPPer airway ronchi CV:  s1 s2 RRT GI: soft, Bs wnl, no r Extremities: no edema     Recent Labs  Lab 07/26/17 0455 07/27/17 0346 07/29/17 0252  NA 140 138 142  K 4.5 4.0 3.4*  CL 91* 88* 89*  CO2 42* 42* 42*  BUN 20 22* 34*  CREATININE 0.75 0.72 0.76  GLUCOSE 132* 149* 166*    Recent Labs  Lab 07/25/17 0054 07/26/17 0455 07/29/17 0252  HGB 13.8 13.1 13.8  HCT 45.2 44.7 43.7  WBC 8.8 14.1* 8.7  PLT 297 291 309    Dg Chest Port 1 View  Result Date: 07/28/2017 CLINICAL DATA:  Acute on chronic respiratory failure with hypercapnia. EXAM: PORTABLE CHEST 1 VIEW COMPARISON:  07/27/2017, 07/24/2017 and 01/13/2017 FINDINGS: The heart size and pulmonary vascularity are normal. Calcification in the thoracic aorta. The lungs are hyperinflated but clear. No effusions. IMPRESSION: No acute abnormality.  Hyperinflated lungs. Aortic Atherosclerosis (ICD10-I70.0). Electronically Signed   By: Francene BoyersJames  Maxwell M.D.   On: 07/28/2017 14:31      SIGNIFICANT EVENTS  11/19  Admit with SOB, suspected COPD exacerbation  STUDIES:  ECHO 11/20 >> LVEF 65-70%, no  RWMA, grade 1 diastolic dysfunction   ASSESSMENT / PLAN:  COPD exacerbation Likely viral pneumonitis contribution pulm edema contribution, some Severe centrilobular emphysema Tobacco abuse, ongoing Acute on chronic respiratory failure with hypercapnea Acute diastolic heart failure, agree  Plan: Continued to use Mandatory NIMV 4 on  Up to 1 hour off, not out of woods yet pcxr slight improved with lasix and neg balance, repeat oral dose Chem in am  Steroids to remain, low threshold to escalate dose but may invoke agitation for female and her body wt Bders to remain Repeat am pcxr Some upper airway sounds, dc ARB Dc nicotine patch , can worsen outcome in icu No ett required as of now History supports viral pneumonia, assess lrti pct in am x 1 I updated pt and family member in room Add labetolol for BP, has tolerated other BB  Ccm time 30 min  Mcarthur Rossettianiel J. Tyson AliasFeinstein, MD, FACP Pgr: [phone] [facility]

## 2017-07-29 NOTE — Evaluation (Signed)
Occupational Therapy Evaluation Patient Details Name: Katie Waller MRN: [id number] DOB: July 02, 1946 Today's Date: 07/29/2017    History of Present Illness 71 year old female with history of COPD, asthma, diabetes mellitus, hypertension, active tobacco use presented with worsening dyspnea.  She was admitted with COPD exacerbation   Clinical Impression   Pt was admitted for the above.  At baseline, she works and is independent with adls.  She does not use 02. Pt is limited by increased WOB/dyspnea. She is very motivated.  Will focus on mod I toilet transfers as well as continue energy conservation education    Follow Up Recommendations  Supervision/Assistance - 24 hour    Equipment Recommendations  None recommended by OT    Recommendations for Other Services       Precautions / Restrictions Precautions Precautions: Fall Restrictions Weight Bearing Restrictions: No      Mobility Bed Mobility Overal bed mobility: Needs Assistance Bed Mobility: Supine to Sit;Sit to Supine     Supine to sit: Min assist;HOB elevated Sit to supine: Min assist;HOB elevated      Transfers Overall transfer level: Needs assistance   Transfers: Sit to/from Stand Sit to Stand: Min assist;+2 physical assistance;+2 safety/equipment         General transfer comment: cues for safe transition position and use of UEs to self assist    Balance Overall balance assessment: Needs assistance Sitting-balance support: No upper extremity supported;Feet supported Sitting balance-Leahy Scale: Good     Standing balance support: Bilateral upper extremity supported Standing balance-Leahy Scale: Poor                             ADL either performed or assessed with clinical judgement   ADL Overall ADL's : Needs assistance/impaired Eating/Feeding: Modified independent Eating/Feeding Details (indicate cue type and reason): extra time Grooming: Set up Grooming Details (indicate cue type  and reason): extra time                               General ADL Comments: Pt with increased WOB, dyspnea.  This limits her adl ability.  Overall extra time for UB and max A for LB.  Pt needs extended time to catch breath even though sats are stable.  Pt is very independent natured.  She has a chair en route to bathroom at home to sit and rest if needed. She does not want DME.     Vision         Perception     Praxis      Pertinent Vitals/Pain Pain Assessment: No/denies pain     Hand Dominance     Extremity/Trunk Assessment Upper Extremity Assessment Upper Extremity Assessment: Generalized weakness          Communication Communication Communication: No difficulties   Cognition Arousal/Alertness: Awake/alert Behavior During Therapy: WFL for tasks assessed/performed Overall Cognitive Status: Within Functional Limits for tasks assessed                                     General Comments  VSS.  02 sats difficult to get reading.  sats 88% after walking and taking several rest breaks.  RR 20-31.  HR 112-131    Exercises     Shoulder Instructions      Home Living Family/patient expects to be discharged to::  Private residence Living Arrangements: Children Available Help at Discharge: Family Type of Home: House Home Access: Stairs to enter Secretary/administratorntrance Stairs-Number of Steps: 1+1+1 Entrance Stairs-Rails: None Home Layout: One level     Bathroom Shower/Tub: Chief Strategy OfficerTub/shower unit   Bathroom Toilet: Standard     Home Equipment: None          Prior Functioning/Environment Level of Independence: Independent        Comments: Pt is Print production planneroffice manager at [employer]        OT Problem List: Decreased activity tolerance;Cardiopulmonary status limiting activity      OT Treatment/Interventions: Self-care/ADL training;Energy conservation;Patient/family education;DME and/or AE instruction;Other (comment)    OT Goals(Current goals can  be found in the care plan section) Acute Rehab OT Goals Patient Stated Goal: I need to get out of here OT Goal Formulation: With patient Time For Goal Achievement: 08/05/17 Potential to Achieve Goals: Good ADL Goals Pt Will Transfer to Toilet: with modified independence;ambulating;regular height toilet Additional ADL Goal #1: pt will verbalize 3 energy conservation strategies  OT Frequency: Min 2X/week   Barriers to D/C:            Co-evaluation   Reason for Co-Treatment: For patient/therapist safety PT goals addressed during session: Mobility/safety with mobility OT goals addressed during session: ADL's and self-care      AM-PAC PT "6 Clicks" Daily Activity     Outcome Measure Help from another person eating meals?: None Help from another person taking care of personal grooming?: A Little Help from another person toileting, which includes using toliet, bedpan, or urinal?: A Little Help from another person bathing (including washing, rinsing, drying)?: A Lot Help from another person to put on and taking off regular upper body clothing?: A Little Help from another person to put on and taking off regular lower body clothing?: A Lot 6 Click Score: 17   End of Session    Activity Tolerance: Patient tolerated treatment well Patient left: in bed;with call bell/phone within reach;with family/visitor present  OT Visit Diagnosis: Unsteadiness on feet (R26.81)                Time: 1356-1430 OT Time Calculation (min): 34 min Charges:  OT General Charges $OT Visit: 1 Visit OT Evaluation $OT Eval Low Complexity: 1 Low G-Codes:     Marica OtterMaryellen Dwana Garin, OTR/L [phone] 07/29/2017  Bev Drennen 07/29/2017, 3:50 PM

## 2017-07-29 NOTE — Evaluation (Signed)
Physical Therapy Evaluation Patient Details Name: Katie DingwallVelma L Waller MRN: [id number] DOB: Aug 23, 1946 Today's Date: 07/29/2017   History of Present Illness  71 year old female with history of COPD, asthma, diabetes mellitus, hypertension, active tobacco use presented with worsening dyspnea.  She was admitted with COPD exacerbation  Clinical Impression  Pt admitted as above and presenting with functional mobility limitations 2* generalized weakness, ambulatory balance deficits and limited endurance.  Pt is very determined to progress to return home with family.    Follow Up Recommendations Home health PT;Other (comment)(dependent on acute stay progress)    Equipment Recommendations  (TBD  dependent on acute stay progress)    Recommendations for Other Services OT consult     Precautions / Restrictions Precautions Precautions: Fall Restrictions Weight Bearing Restrictions: No      Mobility  Bed Mobility Overal bed mobility: Needs Assistance Bed Mobility: Supine to Sit;Sit to Supine     Supine to sit: Min assist;HOB elevated Sit to supine: Min assist;HOB elevated      Transfers Overall transfer level: Needs assistance   Transfers: Sit to/from Stand Sit to Stand: Min assist;+2 physical assistance;+2 safety/equipment         General transfer comment: cues for safe transition position and use of UEs to self assist  Ambulation/Gait Ambulation/Gait assistance: Min assist;+2 physical assistance;+2 safety/equipment Ambulation Distance (Feet): 110 Feet Assistive device: Rolling walker (2 wheeled) Gait Pattern/deviations: Step-through pattern;Decreased step length - right;Decreased step length - left;Shuffle;Trunk flexed Gait velocity: decr   General Gait Details: Increased time, cues for position from RW and multiple standing rest breaks to complete task  Stairs            Wheelchair Mobility    Modified Rankin (Stroke Patients Only)       Balance Overall  balance assessment: Needs assistance Sitting-balance support: No upper extremity supported;Feet supported Sitting balance-Leahy Scale: Good     Standing balance support: Bilateral upper extremity supported Standing balance-Leahy Scale: Poor                               Pertinent Vitals/Pain Pain Assessment: No/denies pain    Home Living Family/patient expects to be discharged to:: Private residence Living Arrangements: Children Available Help at Discharge: Family Type of Home: House Home Access: Stairs to enter Entrance Stairs-Rails: None Secretary/administratorntrance Stairs-Number of Steps: 1+1+1 Home Layout: One level Home Equipment: None      Prior Function Level of Independence: Independent         Comments: Pt is Print production planneroffice manager at [employer]     Hand Dominance        Extremity/Trunk Assessment   Upper Extremity Assessment Upper Extremity Assessment: Generalized weakness    Lower Extremity Assessment Lower Extremity Assessment: Generalized weakness       Communication   Communication: No difficulties  Cognition Arousal/Alertness: Awake/alert Behavior During Therapy: WFL for tasks assessed/performed Overall Cognitive Status: Within Functional Limits for tasks assessed                                        General Comments      Exercises     Assessment/Plan    PT Assessment Patient needs continued PT services  PT Problem List Decreased strength;Decreased activity tolerance;Decreased balance;Decreased mobility;Decreased knowledge of use of DME;Decreased safety awareness       PT Treatment  Interventions DME instruction;Gait training;Stair training;Functional mobility training;Therapeutic activities;Therapeutic exercise;Balance training;Patient/family education    PT Goals (Current goals can be found in the Care Plan section)  Acute Rehab PT Goals Patient Stated Goal: I need to get out of here PT Goal Formulation: With  patient Time For Goal Achievement: 08/12/17 Potential to Achieve Goals: Good    Frequency Min 3X/week   Barriers to discharge        Co-evaluation PT/OT/SLP Co-Evaluation/Treatment: Yes Reason for Co-Treatment: For patient/therapist safety PT goals addressed during session: Mobility/safety with mobility OT goals addressed during session: ADL's and self-care       AM-PAC PT "6 Clicks" Daily Activity  Outcome Measure Difficulty turning over in bed (including adjusting bedclothes, sheets and blankets)?: A Lot Difficulty moving from lying on back to sitting on the side of the bed? : Unable Difficulty sitting down on and standing up from a chair with arms (e.g., wheelchair, bedside commode, etc,.)?: Unable Help needed moving to and from a bed to chair (including a wheelchair)?: A Little Help needed walking in hospital room?: A Little Help needed climbing 3-5 steps with a railing? : A Lot 6 Click Score: 12    End of Session Equipment Utilized During Treatment: Gait belt;Oxygen Activity Tolerance: Patient limited by fatigue Patient left: in bed;with call bell/phone within reach;with family/visitor present Nurse Communication: Mobility status PT Visit Diagnosis: Unsteadiness on feet (R26.81);Muscle weakness (generalized) (M62.81);Difficulty in walking, not elsewhere classified (R26.2)    Time: 1356-1430 PT Time Calculation (min) (ACUTE ONLY): 34 min   Charges:   PT Evaluation $PT Eval Low Complexity: 1 Low     PT G Codes:        Pg [phone]   Kiaria Quinnell 07/29/2017, 2:44 PM

## 2017-07-30 ENCOUNTER — Inpatient Hospital Stay (HOSPITAL_COMMUNITY): Payer: Medicare Other

## 2017-07-30 LAB — CBC WITH DIFFERENTIAL/PLATELET
Basophils Absolute: 0 10*3/uL (ref 0.0–0.1)
Basophils Relative: 0 %
EOS PCT: 0 %
Eosinophils Absolute: 0 10*3/uL (ref 0.0–0.7)
HCT: 44.4 % (ref 36.0–46.0)
Hemoglobin: 13.6 g/dL (ref 12.0–15.0)
LYMPHS ABS: 0.5 10*3/uL — AB (ref 0.7–4.0)
LYMPHS PCT: 6 %
MCH: 29.2 pg (ref 26.0–34.0)
MCHC: 30.6 g/dL (ref 30.0–36.0)
MCV: 95.5 fL (ref 78.0–100.0)
MONO ABS: 0.4 10*3/uL (ref 0.1–1.0)
MONOS PCT: 4 %
Neutro Abs: 8.4 10*3/uL — ABNORMAL HIGH (ref 1.7–7.7)
Neutrophils Relative %: 90 %
PLATELETS: 275 10*3/uL (ref 150–400)
RBC: 4.65 MIL/uL (ref 3.87–5.11)
RDW: 14.4 % (ref 11.5–15.5)
WBC: 9.3 10*3/uL (ref 4.0–10.5)

## 2017-07-30 LAB — BASIC METABOLIC PANEL
ANION GAP: 8 (ref 5–15)
BUN: 32 mg/dL — ABNORMAL HIGH (ref 6–20)
CHLORIDE: 89 mmol/L — AB (ref 101–111)
CO2: 43 mmol/L — ABNORMAL HIGH (ref 22–32)
Calcium: 8.6 mg/dL — ABNORMAL LOW (ref 8.9–10.3)
Creatinine, Ser: 0.79 mg/dL (ref 0.44–1.00)
GFR calc Af Amer: >60 mL/min (ref >60)
GFR calc non Af Amer: >60 mL/min (ref >60)
Glucose, Bld: 146 mg/dL — ABNORMAL HIGH (ref 65–99)
POTASSIUM: 4.2 mmol/L (ref 3.5–5.1)
SODIUM: 140 mmol/L (ref 135–145)

## 2017-07-30 LAB — PROCALCITONIN

## 2017-07-30 MED ORDER — VITAMINS A & D EX OINT
TOPICAL_OINTMENT | CUTANEOUS | Status: AC
  Filled 2017-07-30: qty 5

## 2017-07-30 NOTE — Progress Notes (Signed)
Patient ID: Katie Waller, female   DOB: [birth year], 71 y.o.   MRN: [id number]  PROGRESS NOTE    Katie Waller  YQM:[id number] DOB: [birth year] DOA: 07/24/2017 PCP: Elias Elseeade, Robert, MD   Brief Narrative:  71 year old female with history of COPD not adherent to prescribed home oxygen, asthma, diabetes mellitus, hypertension, active tobacco use presented with worsening dyspnea over the past month. CXR showed hyperinflation and diaphragm flattening with no infiltrate. She was admitted with COPD exacerbation. Echo showed EF 65-70%, G1DD with normal RV and PA caliber/pressures. Work of breathing remained elevated as of 11/21 despite IV steroids, azithromycin, and nebulizer treatments, prompting use of BiPAP and transfer to SDU. Pulmonology consulted. She has continued to require NIPPV intermittently.  Assessment & Plan:   Principal Problem:   COPD exacerbation (HCC) Active Problems:   Smoker   Sinus tachycardia   Essential hypertension   Acute respiratory failure (HCC)   Chronic diastolic CHF (congestive heart failure) (HCC)  Acute on chronic hypoxic/hypercapneic respiratory failure: Decompensated from baseline. PCT undetectable. - Goal SpO2 88-94% - BiPAP prn, intermittently to facilitate rest of respiratory muscles. Pulm recommending on/off q4h.  - Continue prn lorazepam po as this seems to have helped significantly.  - Appreciate PCCM involvement. Confirmed full code status, would be a difficult extubation. May be improving 11/25, CXR stable without infiltrate.   COPD exacerbation:  - Continue IV steroids, may begin taper soon - Continue nebs scheduled and prn.  - Recommend PFTs as outpatient, pulmonology to follow  Essential HTN: Chronic.  - Continue lasix. Holding losartan with stable BPs.   Chronic diastolic CHF: Does not appear volume overloaded. Echo with preserved EF, G1DD. Read as normal PA. - Continue lasix at home oral dose. - Monitor UOP, BMP.  Tobacco use: ~55  pack-years.  - Nicotine patch - Counseling for cessation provided.   DVT prophylaxis: Lovenox Code Status: Full, confirmed Family Communication: At bedside this AM Disposition Plan: Continue SDU while still requiring BiPAP intermittently, as recently as this AM.  Consultants: PCCM  Procedures: BiPAP intermittently 11/21 - 11/25 Echocardiogram 07/25/2017: - Left ventricle: The cavity size was normal. Systolic function was   vigorous. The estimated ejection fraction was in the range of 65%   to 70%. Wall motion was normal; there were no regional wall   motion abnormalities. Doppler parameters are consistent with   abnormal left ventricular relaxation (grade 1 diastolic   dysfunction). - Aortic valve: There was mild regurgitation. - Right ventricle:  The cavity size was normal. Wall thickness was normal. Systolic function was normal. - Pulmonary artery:   The main pulmonary artery was normal-sized. Systolic pressure was within the normal range. - Right atrium:  The atrium was normal in size. - Inferior vena cava: The vessel was normal in size.  Antimicrobials: Azithromycin 11/19 - 11/23  Subjective: Feeling a bit better today. Eating breakfast with nasal cannula. She and her daughter both believe the ativan has helped significantly.   Objective: Vitals:   07/30/17 0800 07/30/17 0854 07/30/17 0900 07/30/17 1000  BP: (!) 145/67  (!) 160/55 (!) 110/46  Pulse: (!) 107  (!) 122 (!) 119  Resp: (!) 31  (!) 21 (!) 23  Temp: 97.8 F (36.6 C)     TempSrc: Oral     SpO2: 100% 100% 100% 100%  Weight:      Height:       General exam: 70yo F in no distress with [place] eating breakfast Respiratory system: Tachypneic, decreased air movement  without wheezes or crackles.  Cardiovascular system: Regular tachycardia with rate in 110's, no JVD.  Gastrointestinal system: Abdomen is nondistended, soft and nontender. Normal bowel sounds heard. Extremities: No cyanosis, clubbing; no significant  edema Neuro: Alert, oriented, jittery without asterixis. No focal deficits  Data Reviewed: I have personally reviewed following labs and imaging studies  CBC: Recent Labs  Lab 07/24/17 1100 07/25/17 0054 07/26/17 0455 07/29/17 0252 07/30/17 0313  WBC 13.5* 8.8 14.1* 8.7 9.3  NEUTROABS 9.4*  --  12.8*  --  8.4*  HGB 13.9 13.8 13.1 13.8 13.6  HCT 43.9 45.2 44.7 43.7 44.4  MCV 95.0 97.4 100.2* 94.2 95.5  PLT 340 297 291 309 275   Basic Metabolic Panel: Recent Labs  Lab 07/25/17 0054 07/26/17 0455 07/27/17 0346 07/29/17 0252 07/30/17 0313  NA 139 140 138 142 140  K 4.4 4.5 4.0 3.4* 4.2  CL 89* 91* 88* 89* 89*  CO2 41* 42* 42* 42* 43*  GLUCOSE 145* 132* 149* 166* 146*  BUN 17 20 22* 34* 32*  CREATININE 0.81 0.75 0.72 0.76 0.79  CALCIUM 9.1 9.1 8.8* 8.9 8.6*  MG  --  2.3  --   --   --    GFR: Estimated Creatinine Clearance: 57.2 mL/min (by C-G formula based on SCr of 0.79 mg/dL). Liver Function Tests: Recent Labs  Lab 07/24/17 1100 07/25/17 0054  AST 18 17  ALT 17 17  ALKPHOS 51 48  BILITOT 0.4 0.3  PROT 6.7 6.7  ALBUMIN 3.6 3.3*   No results for input(s): LIPASE, AMYLASE in the last 168 hours. No results for input(s): AMMONIA in the last 168 hours. Coagulation Profile: No results for input(s): INR, PROTIME in the last 168 hours. Cardiac Enzymes: Recent Labs  Lab 07/24/17 1100 07/24/17 1851 07/25/17 0054 07/25/17 0644  TROPONINI 0.03* <0.03 <0.03 <0.03   Scheduled Meds: . arformoterol  15 mcg Nebulization BID  . aspirin  81 mg Oral Daily  . budesonide (PULMICORT) nebulizer solution  0.5 mg Nebulization BID  . enoxaparin (LOVENOX) injection  40 mg Subcutaneous Q24H  . furosemide  40 mg Oral Daily  . ipratropium-albuterol  3 mL Nebulization Q6H  . methylPREDNISolone (SOLU-MEDROL) injection  80 mg Intravenous Q8H  . raloxifene  60 mg Oral Daily  . sodium chloride flush  3 mL Intravenous Q12H   Continuous Infusions: . sodium chloride       LOS: 6  days   Time spent: 35 minutes.  Hazeline Junkeryan Ranell Finelli, MD [facility] Pager [phone]   If 7PM-7AM, please contact night-coverage [email or web address] Password [email or web address] 07/30/2017, 11:03 AM

## 2017-07-30 NOTE — Progress Notes (Signed)
Name: Katie DingwallVelma L Waller MRN: [id number] DOB: 11-Nov-1945    ADMISSION DATE:  07/24/2017 CONSULTATION DATE:  07/26/17  REFERRING MD :  Dr. Jarvis NewcomerGrunz / [facility]   CHIEF COMPLAINT:  Dyspnea   BRIEF HISTORY OF PRESENT ILLNESS:  71 y/o F, smoker (55 years, 1ppd), who [facility] with acute on chronic respiratory failure with hypoxemia due to COPD exacerbation.   SUBJECTIVE:  Used BIPAP as scheduled Anxiety intermittent With some distress at rest still intermittent  VITAL SIGNS: Temp:  [97.8 F (36.6 C)-98.7 F (37.1 C)] 97.8 F (36.6 C) (11/25 0800) Pulse Rate:  [96-123] 119 (11/25 1000) Resp:  [19-31] 23 (11/25 1000) BP: (102-164)/(46-139) 110/46 (11/25 1000) SpO2:  [91 %-100 %] 100 % (11/25 1000) FiO2 (%):  [25 %-30 %] 30 % (11/25 0400) Weight:  [62.6 kg (138 lb)-62.9 kg (138 lb 10.7 oz)] 62.9 kg (138 lb 10.7 oz) (11/25 0500)  PHYSICAL EXAMINATION:  General: mild distress at rest on NIMV Neuro: awake, alert, o x 4, nonfocal HEENT: jvd up PULM: distant air entry CV: s1 s2 RRR not tachy GI: soft, bs wnl, no r Extremities: no rash, min edema      Recent Labs  Lab 07/27/17 0346 07/29/17 0252 07/30/17 0313  NA 138 142 140  K 4.0 3.4* 4.2  CL 88* 89* 89*  CO2 42* 42* 43*  BUN 22* 34* 32*  CREATININE 0.72 0.76 0.79  GLUCOSE 149* 166* 146*    Recent Labs  Lab 07/26/17 0455 07/29/17 0252 07/30/17 0313  HGB 13.1 13.8 13.6  HCT 44.7 43.7 44.4  WBC 14.1* 8.7 9.3  PLT 291 309 275    Dg Chest Port 1 View  Result Date: 07/30/2017 CLINICAL DATA:  COPD. EXAM: PORTABLE CHEST 1 VIEW COMPARISON:  07/28/2017 FINDINGS: The cardiomediastinal silhouette is unchanged and within normal limits. Aortic atherosclerosis is noted. The lungs are slightly less well inflated than on the prior study. No confluent airspace opacity, edema, sizable pleural effusion, or pneumothorax is identified. IMPRESSION: No evidence of pneumonia or edema. Electronically Signed   By: Sebastian AcheAllen   Grady M.D.   On: 07/30/2017 06:53   Dg Chest Port 1 View  Result Date: 07/28/2017 CLINICAL DATA:  Acute on chronic respiratory failure with hypercapnia. EXAM: PORTABLE CHEST 1 VIEW COMPARISON:  07/27/2017, 07/24/2017 and 01/13/2017 FINDINGS: The heart size and pulmonary vascularity are normal. Calcification in the thoracic aorta. The lungs are hyperinflated but clear. No effusions. IMPRESSION: No acute abnormality.  Hyperinflated lungs. Aortic Atherosclerosis (ICD10-I70.0). Electronically Signed   By: Francene BoyersJames  Maxwell M.D.   On: 07/28/2017 14:31      SIGNIFICANT EVENTS  11/19  Admit with SOB, suspected COPD exacerbation  STUDIES:  ECHO 11/20 >> LVEF 65-70%, no RWMA, grade 1 diastolic dysfunction   ASSESSMENT / PLAN:  COPD exacerbation Acute resp failure Likely viral pneumonitis contribution pulm edema contribution, some Severe centrilobular emphysema Tobacco abuse, ongoing Acute on chronic respiratory failure with hypercapnea Acute diastolic heart failure, agree  Plan: pcxr I reviewed shows some improvement since last 72 hours in reduction edema, still with chronic int changes Keep neg as tolerated She still requires scheduled NIMV to try and avoid intubation She refuses to use in total sometimes the 4 hours Explained to family importance of 4 hours on , 1-2 hours off is okay if she can tolerate but to not wait till worsening distress pcxr is likely at baseline now Would keep steroids at current dose Lasix to remain, last echo showed  mild diast failure, family explains chronic feet edema Continued to hold ARB No further upper airway concerns May need repeat echo, I reviewed last If [facility] to [facility] of progress will CT chest and consider duplex legs  I updated daughter at bedside and on phone extensive    Ccm time 30 min   Mcarthur Rossettianiel J. Tyson AliasFeinstein, MD, FACP Pgr: [phone] [facility]

## 2017-07-31 ENCOUNTER — Inpatient Hospital Stay (HOSPITAL_COMMUNITY): Payer: Medicare Other

## 2017-07-31 ENCOUNTER — Encounter (HOSPITAL_COMMUNITY): Payer: Self-pay | Admitting: Radiology

## 2017-07-31 DIAGNOSIS — R0602 Shortness of breath: Secondary | ICD-10-CM

## 2017-07-31 LAB — BLOOD GAS, ARTERIAL
Acid-Base Excess: 15.8 mmol/L — ABNORMAL HIGH (ref 0.0–2.0)
BICARBONATE: 46 mmol/L — AB (ref 20.0–28.0)
DRAWN BY: 270211
Delivery systems: POSITIVE
Expiratory PAP: 6
FIO2: 0.3
Inspiratory PAP: 16
Mode: POSITIVE
O2 SAT: 90.4 %
PATIENT TEMPERATURE: 98.6
PCO2 ART: 89 mmHg — AB (ref 32.0–48.0)
PO2 ART: 60.7 mmHg — AB (ref 83.0–108.0)
pH, Arterial: 7.334 — ABNORMAL LOW (ref 7.350–7.450)

## 2017-07-31 LAB — BASIC METABOLIC PANEL
ANION GAP: 8 (ref 5–15)
ANION GAP: 8 (ref 5–15)
BUN: 29 mg/dL — AB (ref 6–20)
BUN: 28 mg/dL — AB (ref 6–20)
CALCIUM: 8.7 mg/dL — AB (ref 8.9–10.3)
CO2: 40 mmol/L — AB (ref 22–32)
CO2: 44 mmol/L — AB (ref 22–32)
Calcium: 8.5 mg/dL — ABNORMAL LOW (ref 8.9–10.3)
Chloride: 90 mmol/L — ABNORMAL LOW (ref 101–111)
Chloride: 90 mmol/L — ABNORMAL LOW (ref 101–111)
Creatinine, Ser: 0.73 mg/dL (ref 0.44–1.00)
Creatinine, Ser: 0.64 mg/dL (ref 0.44–1.00)
GFR calc Af Amer: >60 mL/min (ref >60)
GFR calc Af Amer: >60 mL/min (ref >60)
GFR calc non Af Amer: >60 mL/min (ref >60)
GFR calc non Af Amer: >60 mL/min (ref >60)
GLUCOSE: 130 mg/dL — AB (ref 65–99)
GLUCOSE: 197 mg/dL — AB (ref 65–99)
POTASSIUM: 5.6 mmol/L — AB (ref 3.5–5.1)
POTASSIUM: 4.1 mmol/L (ref 3.5–5.1)
Sodium: 138 mmol/L (ref 135–145)
Sodium: 142 mmol/L (ref 135–145)

## 2017-07-31 MED ORDER — POLYETHYLENE GLYCOL 3350 17 G PO PACK
17.0000 g | PACK | Freq: Two times a day (BID) | ORAL | Status: DC
  Administered 2017-07-31 – 2017-08-06 (×7): 17 g via ORAL
  Filled 2017-07-31 (×9): qty 1

## 2017-07-31 MED ORDER — SODIUM POLYSTYRENE SULFONATE 15 GM/60ML PO SUSP
15.0000 g | Freq: Once | ORAL | Status: AC
  Administered 2017-07-31: 15 g via ORAL
  Filled 2017-07-31: qty 60

## 2017-07-31 MED ORDER — DOCUSATE SODIUM 100 MG PO CAPS
200.0000 mg | ORAL_CAPSULE | Freq: Two times a day (BID) | ORAL | Status: DC
  Administered 2017-07-31 – 2017-08-06 (×7): 200 mg via ORAL
  Filled 2017-07-31 (×9): qty 2

## 2017-07-31 MED ORDER — IOPAMIDOL (ISOVUE-370) INJECTION 76%
INTRAVENOUS | Status: AC
Start: 2017-07-31 — End: 2017-07-31
  Administered 2017-07-31: 100 mL via INTRAVENOUS
  Filled 2017-07-31: qty 100

## 2017-07-31 NOTE — Progress Notes (Signed)
Pt put back on Bipap on previous settings

## 2017-07-31 NOTE — Progress Notes (Signed)
PT Cancellation Note  Patient Details Name: Katie DingwallVelma L Waller MRN: [id number] DOB: 04-10-1946   Cancelled Treatment:    Reason Eval/Treat Not Completed: Medical issues which prohibited therapy(pt on Bipap, very sleepy, HR and RR elevated at rest)   [facility]ILLIAMS,Fleetwood Pierron 07/31/2017, 11:33 AM

## 2017-07-31 NOTE — Progress Notes (Signed)
Name: Katie DingwallVelma L Cawley MRN: [id number] DOB: 06-16-1946    ADMISSION DATE:  07/24/2017 CONSULTATION DATE:  07/26/17  REFERRING MD :  Dr. Jarvis NewcomerGrunz / [facility]   CHIEF COMPLAINT:  Dyspnea   BRIEF HISTORY OF PRESENT ILLNESS:  71 y/o F, smoker (55 years, 1ppd), who [facility] with acute on chronic respiratory failure with hypoxemia due to COPD exacerbation.   SUBJECTIVE:  Pt reports feeling fatigued.  Son reports she didn't rest well overnight.  Patient stated previously she did not want to be intubated  (son and daughter would want this for her to allow them time to call family in).    VITAL SIGNS: Temp:  [97.4 F (36.3 C)-98.9 F (37.2 C)] 98 F (36.7 C) (11/26 0800) Pulse Rate:  [107-129] 116 (11/26 0800) Resp:  [16-32] 24 (11/26 0800) BP: (110-169)/(46-83) 169/83 (11/26 0800) SpO2:  [91 %-100 %] 95 % (11/26 0800) FiO2 (%):  [30 %] 30 % (11/25 1414) Weight:  [139 lb 1.8 oz (63.1 kg)] 139 lb 1.8 oz (63.1 kg) (11/26 0500)  PHYSICAL EXAMINATION: General: chronically ill appearing female in NAD, appears fatigued HEENT: MM pink/moist, no jvd PSY: calm/appropriate  Neuro: AAOx4, speech clear, generalized weakness  CV: s1s2 rrr, no m/r/g PULM: even/non-labored, lungs bilaterally diminished but improved airflow from prior exams  WU:JWJXGI:soft, non-tender, bsx4 active  Extremities: warm/dry, trace peripheral edema  Skin: no rashes or lesions   Recent Labs  Lab 07/29/17 0252 07/30/17 0313 07/31/17 0559  NA 142 140 138  K 3.4* 4.2 5.6*  CL 89* 89* 90*  CO2 42* 43* 40*  BUN 34* 32* 29*  CREATININE 0.76 0.79 0.73  GLUCOSE 166* 146* 130*    Recent Labs  Lab 07/26/17 0455 07/29/17 0252 07/30/17 0313  HGB 13.1 13.8 13.6  HCT 44.7 43.7 44.4  WBC 14.1* 8.7 9.3  PLT 291 309 275    Dg Chest Port 1 View  Result Date: 07/30/2017 CLINICAL DATA:  COPD. EXAM: PORTABLE CHEST 1 VIEW COMPARISON:  07/28/2017 FINDINGS: The cardiomediastinal silhouette is unchanged and within  normal limits. Aortic atherosclerosis is noted. The lungs are slightly less well inflated than on the prior study. No confluent airspace opacity, edema, sizable pleural effusion, or pneumothorax is identified. IMPRESSION: No evidence of pneumonia or edema. Electronically Signed   By: Sebastian AcheAllen  Grady M.D.   On: 07/30/2017 06:53     SIGNIFICANT EVENTS  11/19  Admit with SOB, suspected COPD exacerbation  STUDIES:  ECHO 11/20 >> LVEF 65-70%, no RWMA, grade 1 diastolic dysfunction   ASSESSMENT / PLAN:  COPD exacerbation Acute resp failure Likely viral pneumonitis contribution Pulm edema contribution, some Severe centrilobular emphysema Tobacco abuse, ongoing Acute on chronic respiratory failure with hypercapnea Acute diastolic heart failure, agree  Plan: Intermittent BiPAP > cycle on 2 hours, off 2 for today  Negative balance as tolerated  Patient stated she would not want intubation  Feel her O2 needs are likely at baseline, she previously was to be on O2 but turned it back in.  Doubt PE.  However, will scan LE's for DVT / CTA chest.   Hold ARB  Continue Brovana, Pulmicort, Duoneb Solumedrol 80 mg IV Q8 Follow intermittent CXR O2 for sats 88-94% (No higher)   Canary BrimBrandi Ollis, NP-C [facility] Pgr: [phone] or if no answer [phone] 07/31/2017, 9:33 AM   STAFF NOTE: I, Rory Percyaniel Panzy Bubeck, MD FACP have personally reviewed patient's available data, including medical history, events of note, physical examination  and test results as part of my evaluation. I have discussed with resident/NP and other care providers such as pharmacist, RN and RRT. In addition, I personally evaluated patient and elicited key findings of: moderate distress, less awake, poor air entry, jvd wnl, abdo soft, lower ext edema present, work of breathing is up, pcxr last I reviewed with chronic int changes, she is NOT responding to current therapy for traditional copd / viral , wil CT chest STAT with  contrast, dopplers neg, could stil have in situ PE, she requires NIMV 4 on 1 off, get STAT ABG, if co2 allows and pt wants could use 2 / 2, her K is up, and being treated, but I am concerned she could be acidotic contributing, get STAT abg, if it is an issue, we have maxed IPAP, and would change to NIMV AC mode attempt or PC mode, keep even to neg, may need repeat lasix, kidney fxn to tolerate, she had echo 20th I re reviewed no repeat needed I have had extensive discussions with family . We discussed patients current circumstances and organ failures. We also discussed patient's prior wishes under circumstances such as this. Family has decided to NOT perform resuscitation if arrest but to continue current medical support for now. So no cpr, shock or ett as of now. Doing cpr acls without ett woul dbe futile and allow her to suffer The patient is critically ill with multiple organ systems failure and requires high complexity decision making for assessment and support, frequent evaluation and titration of therapies, application of advanced monitoring technologies and extensive interpretation of multiple databases.   Critical Care Time devoted to patient care services described in this note is30 Minutes. This time reflects time of care of this signee: Rory Percyaniel Rehanna Oloughlin, MD FACP. This critical care time does not reflect procedure time, or teaching time or supervisory time of PA/NP/Med student/Med Resident etc but could involve care discussion time. Rest per NP/medical resident whose note is outlined above and that I agree with   Mcarthur Rossettianiel J. Tyson AliasFeinstein, MD, FACP Pgr: [phone] [facility] 07/31/2017 11:15 AM

## 2017-07-31 NOTE — Progress Notes (Signed)
Patient ID: Katie Waller, female   DOB: [birth year], 71 y.o.   MRN: [id number]  PROGRESS NOTE    Katie Waller  [id number] DOB: [birth year] DOA: 07/24/2017 PCP: Elias Elseeade, Robert, MD   Brief Narrative:  71 year old female with history of COPD not adherent to prescribed home oxygen, asthma, diabetes mellitus, hypertension, active tobacco use presented with worsening dyspnea over the past month. CXR showed hyperinflation and diaphragm flattening with no infiltrate. She was admitted with COPD exacerbation. Echo showed EF 65-70%, G1DD with normal RV and PA caliber/pressures. Work of breathing remained elevated as of 11/21 despite IV steroids, azithromycin, and nebulizer treatments, prompting use of BiPAP and transfer to SDU. Pulmonology consulted. She has continued to require NIPPV intermittently.   Subjective:   Patient in bed, states shortness of breath is better, denies any headache chest or abdominal pain, she does not want to be intubated if needed.  Son bedside.  Assessment & Plan:   Acute on chronic hypoxic/hypercapneic respiratory failure in a patient with advanced COPD who is noncompliant with home oxygen and still smoking: Chest x-ray not impressive, echogram shows grade 1 diastolic CHF with stable preserved EF and no increase in right-sided pressures, pulmonary critical care on board, patient on IV steroids, oxygen with intermittent BiPAP which is ongoing along with nebulizer treatments.  She has been counseled to quit smoking and be compliant with home oxygen.  She appears to have advanced and terminal COPD.  Had detailed discussions with the patient and her son bedside on 07/31/2017 she wants to be DNI even if she is near death.  Since she continues to be severely hypoxic along with tachycardic will obtain CT angiogram chest and lower extremity venous duplex.  Essential HTN: Chronic.  - Continue lasix. Holding losartan with stable BPs.   Mild acute on chronic diastolic CHF EF 65% :  Currently on IV Lasix continue and monitor.  Tobacco use: ~55 pack-years.  She has been counseled to quit, continue NicoDerm patch.  Hyperkalemia.  Bowel regimen, Kayexalate, for now ARB on hold.    DVT prophylaxis: Lovenox Code Status: DNI, confirmed Family Communication: Son Bedside by me on 07/31/2017 Disposition Plan: Continue SDU while still requiring BiPAP intermittently, as recently as this AM.  Consultants: PCCM  Procedures:   BiPAP intermittently 11/21 - 11/25  Echocardiogram 07/25/2017: - Left ventricle: The cavity size was normal. Systolic function was vigorous. The estimated ejection fraction was in the range of 65%   to 70%. Wall motion was normal; there were no regional wall  motion abnormalities. Doppler parameters are consistent with abnormal left ventricular relaxation (grade 1 diastolic   dysfunction). - Aortic valve: There was mild regurgitation. - Right ventricle:  The cavity size was normal. Wall thickness was normal. Systolic function was normal. - Pulmonary artery:   The main pulmonary artery was normal-sized. Systolic pressure was within the normal range. - Right atrium:  The atrium was normal in size. - Inferior vena cava: The vessel was normal in size.   Antimicrobials: Azithromycin 11/19 - 11/23    Objective: Vitals:   07/31/17 0500 07/31/17 0600 07/31/17 0724 07/31/17 0800  BP: 133/65 112/72  (!) 169/83  Pulse: (!) 107 (!) 109  (!) 116  Resp: (!) 25 (!) 24  (!) 24  Temp:    98 F (36.7 C)  TempSrc:    Axillary  SpO2: 93% 91% 96% 95%  Weight: 63.1 kg (139 lb 1.8 oz)     Height:  Physical exam.  Awake Alert, No new F.N deficits, Normal affect [place].AT,PERRAL Supple Neck,No JVD, No cervical lymphadenopathy appriciated.  Symmetrical Chest wall movement, minimal air movement bilaterally, a few expiratory wheezes, moves minimal air Rapid rate and rhythm, No Gallops, Rubs or new Murmurs, No Parasternal Heave +ve B.Sounds, Abd Soft, No  tenderness, No organomegaly appriciated, No rebound - guarding or rigidity. No Cyanosis, Clubbing or edema, No new Rash or bruise   Data Reviewed: I have personally reviewed following labs and imaging studies  CBC: Recent Labs  Lab 07/24/17 1100 07/25/17 0054 07/26/17 0455 07/29/17 0252 07/30/17 0313  WBC 13.5* 8.8 14.1* 8.7 9.3  NEUTROABS 9.4*  --  12.8*  --  8.4*  HGB 13.9 13.8 13.1 13.8 13.6  HCT 43.9 45.2 44.7 43.7 44.4  MCV 95.0 97.4 100.2* 94.2 95.5  PLT 340 297 291 309 275   Basic Metabolic Panel: Recent Labs  Lab 07/26/17 0455 07/27/17 0346 07/29/17 0252 07/30/17 0313 07/31/17 0559  NA 140 138 142 140 138  K 4.5 4.0 3.4* 4.2 5.6*  CL 91* 88* 89* 89* 90*  CO2 42* 42* 42* 43* 40*  GLUCOSE 132* 149* 166* 146* 130*  BUN 20 22* 34* 32* 29*  CREATININE 0.75 0.72 0.76 0.79 0.73  CALCIUM 9.1 8.8* 8.9 8.6* 8.7*  MG 2.3  --   --   --   --    GFR: Estimated Creatinine Clearance: 57.3 mL/min (by C-G formula based on SCr of 0.73 mg/dL). Liver Function Tests: Recent Labs  Lab 07/24/17 1100 07/25/17 0054  AST 18 17  ALT 17 17  ALKPHOS 51 48  BILITOT 0.4 0.3  PROT 6.7 6.7  ALBUMIN 3.6 3.3*   No results for input(s): LIPASE, AMYLASE in the last 168 hours. No results for input(s): AMMONIA in the last 168 hours. Coagulation Profile: No results for input(s): INR, PROTIME in the last 168 hours. Cardiac Enzymes: Recent Labs  Lab 07/24/17 1100 07/24/17 1851 07/25/17 0054 07/25/17 0644  TROPONINI 0.03* <0.03 <0.03 <0.03   Scheduled Meds: . arformoterol  15 mcg Nebulization BID  . aspirin  81 mg Oral Daily  . budesonide (PULMICORT) nebulizer solution  0.5 mg Nebulization BID  . enoxaparin (LOVENOX) injection  40 mg Subcutaneous Q24H  . furosemide  40 mg Oral Daily  . ipratropium-albuterol  3 mL Nebulization Q6H  . methylPREDNISolone (SOLU-MEDROL) injection  80 mg Intravenous Q8H  . raloxifene  60 mg Oral Daily  . sodium chloride flush  3 mL Intravenous Q12H    . sodium polystyrene  15 g Oral Once   Continuous Infusions: . sodium chloride       LOS: 7 days   Time spent: 35 minutes.  Signature  Susa RaringPrashant Aaron Boeh M.D on 07/31/2017 at 9:19 AM  Between 7am to 7pm - Pager - [phone] ( page via [email or web address], text pages only, please mention full 10 digit call back number).  After 7pm go to [email or web address] - password [email or web address]

## 2017-07-31 NOTE — Progress Notes (Signed)
RN took patient off the BIPAP during the night stating that patient did not want to wear it. RT will monitor

## 2017-07-31 NOTE — Progress Notes (Signed)
OT Cancellation Note  Patient Details Name: Katie Waller MRN: [id number] DOB: [birth year]   Cancelled Treatment:    Reason Eval/Treat Not Completed: Medical issues which prohibited therapy   (pt on Bipap, very sleepy, HR and RR elevated at rest)   Jeanie SewerREDDING, Dorena BodoLorraine D  Lori Elycia [place], [place] [phone] 07/31/2017, 11:59 AM

## 2017-07-31 NOTE — Progress Notes (Signed)
Bilateral lower extremity venous duplex has been completed. Negative for DVT.  07/31/17 10:42 AM Olen CordialGreg Leidi Astle RVT

## 2017-08-01 ENCOUNTER — Inpatient Hospital Stay (HOSPITAL_COMMUNITY): Payer: Medicare Other

## 2017-08-01 DIAGNOSIS — Z7189 Other specified counseling: Secondary | ICD-10-CM

## 2017-08-01 DIAGNOSIS — Z515 Encounter for palliative care: Secondary | ICD-10-CM

## 2017-08-01 DIAGNOSIS — I5032 Chronic diastolic (congestive) heart failure: Secondary | ICD-10-CM

## 2017-08-01 DIAGNOSIS — F17200 Nicotine dependence, unspecified, uncomplicated: Secondary | ICD-10-CM

## 2017-08-01 DIAGNOSIS — J9622 Acute and chronic respiratory failure with hypercapnia: Secondary | ICD-10-CM

## 2017-08-01 LAB — CBC
HEMATOCRIT: 44.9 % (ref 36.0–46.0)
HEMOGLOBIN: 13.4 g/dL (ref 12.0–15.0)
MCH: 29.5 pg (ref 26.0–34.0)
MCHC: 29.8 g/dL — AB (ref 30.0–36.0)
MCV: 98.7 fL (ref 78.0–100.0)
Platelets: 246 10*3/uL (ref 150–400)
RBC: 4.55 MIL/uL (ref 3.87–5.11)
RDW: 14.4 % (ref 11.5–15.5)
WBC: 11.5 10*3/uL — ABNORMAL HIGH (ref 4.0–10.5)

## 2017-08-01 LAB — BASIC METABOLIC PANEL
Anion gap: 7 (ref 5–15)
BUN: 30 mg/dL — AB (ref 6–20)
CHLORIDE: 90 mmol/L — AB (ref 101–111)
CO2: 45 mmol/L — AB (ref 22–32)
CREATININE: 0.59 mg/dL (ref 0.44–1.00)
Calcium: 8.7 mg/dL — ABNORMAL LOW (ref 8.9–10.3)
GFR calc Af Amer: >60 mL/min (ref >60)
GFR calc non Af Amer: >60 mL/min (ref >60)
Glucose, Bld: 153 mg/dL — ABNORMAL HIGH (ref 65–99)
Potassium: 4.4 mmol/L (ref 3.5–5.1)
Sodium: 142 mmol/L (ref 135–145)

## 2017-08-01 LAB — MAGNESIUM: Magnesium: 2.5 mg/dL — ABNORMAL HIGH (ref 1.7–2.4)

## 2017-08-01 MED ORDER — FUROSEMIDE 10 MG/ML IJ SOLN
40.0000 mg | Freq: Once | INTRAMUSCULAR | Status: AC
  Administered 2017-08-01: 40 mg via INTRAVENOUS
  Filled 2017-08-01: qty 4

## 2017-08-01 MED ORDER — BISACODYL 10 MG RE SUPP
10.0000 mg | Freq: Every day | RECTAL | Status: DC
  Administered 2017-08-01 – 2017-08-06 (×4): 10 mg via RECTAL
  Filled 2017-08-01 (×7): qty 1

## 2017-08-01 MED ORDER — METHYLPREDNISOLONE SODIUM SUCC 125 MG IJ SOLR
60.0000 mg | Freq: Two times a day (BID) | INTRAMUSCULAR | Status: DC
  Administered 2017-08-01 – 2017-08-02 (×2): 60 mg via INTRAVENOUS
  Filled 2017-08-01 (×2): qty 2

## 2017-08-01 MED ORDER — FUROSEMIDE 40 MG PO TABS
40.0000 mg | ORAL_TABLET | Freq: Every day | ORAL | Status: DC
  Administered 2017-08-02 – 2017-08-07 (×6): 40 mg via ORAL
  Filled 2017-08-01 (×6): qty 1

## 2017-08-01 NOTE — Progress Notes (Signed)
Patient ID: Katie Waller, female   DOB: 09/07/1945, 71 y.o.   MRN: [id number]  PROGRESS NOTE    Katie Waller  WUJ:[id number] DOB: 09/07/1945 DOA: 07/24/2017 PCP: Elias Elseeade, Robert, MD   Brief Narrative:  71 year old female with history of COPD not adherent to prescribed home oxygen, asthma, diabetes mellitus, hypertension, active tobacco use presented with worsening dyspnea over the past month. CXR showed hyperinflation and diaphragm flattening with no infiltrate. She was admitted with COPD exacerbation. Echo showed EF 65-70%, G1DD with normal RV and PA caliber/pressures. Work of breathing remained elevated as of 11/21 despite IV steroids, azithromycin, and nebulizer treatments, prompting use of BiPAP and transfer to SDU. Pulmonology consulted. She has continued to require NIPPV intermittently.   Subjective:   Patient in bed, appears a little fatigued, denies any headache, no fever, no chest pain or pressure, continues to have shortness of breath , no abdominal pain. No focal weakness.   Assessment & Plan:   Acute on chronic hypoxic/hypercapneic respiratory failure in a patient with advanced COPD who is noncompliant with home oxygen and still smoking:  Chest x-ray not impressive, echogram shows grade 1 diastolic CHF with stable preserved EF and no increase in right-sided pressures, pulmonary critical care on board, CT angiogram chest negative for PE, continue the patient on combination of IV steroids, nebulizer treatments and as needed 3-4 L nasal cannula oxygen as needed, she has been on and off BiPAP every 4-6 hours for the last 1 week.   She has been counseled to quit smoking and be compliant with home oxygen.  She appears to have advanced and terminal COPD.  Had detailed discussions with the patient and her son bedside on 07/31/2017 she wants to be DNI even if she is near death, later in the day pulmonary talk to the family and patient and made her DNR.  I confirmed DNR this morning and she  agrees to it.  Since she seems to be now virtually BiPAP dependent for the last 1 week and eventually wants to be DNR will involve palliative care for goals of care.  Prognosis appears guarded.   Essential HTN: Chronic.  - Continue lasix. Holding losartan with stable BPs.   Mild acute on chronic diastolic CHF EF 65% : We will switch to IV Lasix and see if it makes any difference, currently on 40 mg of daily Lasix, BUN is slightly increased however since there is no clinical improvement in terms of her respiratory status was challenging with 1 dose of IV Lasix today.  Tobacco use: ~55 pack-years.  She has been counseled to quit, continue NicoDerm patch.  Hyperkalemia.  Bowel regimen, Kayexalate, for now ARB on hold.  Nonspecific CT findings of liver density and possible subclavian artery stenosis on the left side.  Outpatient workup clinically appropriate at that time.    DVT prophylaxis: Lovenox Code Status: DNR, confirmed by PCCM Family Communication: Son Bedside by me on 07/31/2017 Disposition Plan: Continue SDU while still requiring BiPAP intermittently, as recently as this AM.  Consultants: PCCM, Pall care  Procedures:   BiPAP intermittently  From 11/21   Echocardiogram 07/25/2017: - Left ventricle: The cavity size was normal. Systolic function was vigorous. The estimated ejection fraction was in the range of 65%  to 70%. Wall motion was normal; there were no regional wall  motion abnormalities. Doppler parameters are consistent with abnormal left ventricular relaxation (grade 1 diastolic   dysfunction). - Aortic valve: There was mild regurgitation. - Right ventricle:  The  cavity size was normal. Wall thickness was normal. Systolic function was normal. - Pulmonary artery:   The main pulmonary artery was normal-sized. Systolic pressure was within the normal range. - Right atrium:  The atrium was normal in size. - Inferior vena cava: The vessel was normal in size.  CTA chest.    No evidence of acute pulmonary thromboembolism.  Prominent atherosclerotic changes throughout the thorax. Significant narrowing at the origin of the left subclavian artery cannot be excluded.  There are low-density lesions in the liver. The largest is stable. Other lesions were not clearly identified on the prior study. If there is a history of malignancy or risk of malignancy, consider six-month follow-up MRI.  Lower extremity bilateral venous duplex. Negative for DVT.     Antimicrobials: Azithromycin 11/19 - 11/23    Objective: Vitals:   08/01/17 0600 08/01/17 0704 08/01/17 0732 08/01/17 0740  BP: (!) 175/80 (!) 153/74    Pulse: (!) 107 88    Resp: 20 (!) 22    Temp:    98.1 F (36.7 C)  TempSrc:    Axillary  SpO2: 95% 92% 95%   Weight: 62.7 kg (138 lb 3.7 oz)     Height:        Physical exam.  Awake Alert, No new F.N deficits, looks a little tired [place].AT,PERRAL Supple Neck,No JVD, No cervical lymphadenopathy appriciated.  Symmetrical Chest wall movement, minimal air movement bilaterally, a few expiratory wheezes, moves minimal air Rapid rate and rhythm, No Gallops, Rubs or new Murmurs, No Parasternal Heave +ve B.Sounds, Abd Soft, No tenderness, No organomegaly appriciated, No rebound - guarding or rigidity. No Cyanosis, Clubbing or edema, No new Rash or bruise   Data Reviewed: I have personally reviewed following labs and imaging studies  CBC: Recent Labs  Lab 07/26/17 0455 07/29/17 0252 07/30/17 0313 08/01/17 0330  WBC 14.1* 8.7 9.3 11.5*  NEUTROABS 12.8*  --  8.4*  --   HGB 13.1 13.8 13.6 13.4  HCT 44.7 43.7 44.4 44.9  MCV 100.2* 94.2 95.5 98.7  PLT 291 309 275 246   Basic Metabolic Panel: Recent Labs  Lab 07/26/17 0455  07/29/17 0252 07/30/17 0313 07/31/17 0559 07/31/17 1757 08/01/17 0330  NA 140   < > 142 140 138 142 142  K 4.5   < > 3.4* 4.2 5.6* 4.1 4.4  CL 91*   < > 89* 89* 90* 90* 90*  CO2 42*   < > 42* 43* 40* 44* 45*  GLUCOSE  132*   < > 166* 146* 130* 197* 153*  BUN 20   < > 34* 32* 29* 28* 30*  CREATININE 0.75   < > 0.76 0.79 0.73 0.64 0.59  CALCIUM 9.1   < > 8.9 8.6* 8.7* 8.5* 8.7*  MG 2.3  --   --   --   --   --  2.5*   < > = values in this interval not displayed.   GFR: Estimated Creatinine Clearance: 57.2 mL/min (by C-G formula based on SCr of 0.59 mg/dL). Liver Function Tests: No results for input(s): AST, ALT, ALKPHOS, BILITOT, PROT, ALBUMIN in the last 168 hours. No results for input(s): LIPASE, AMYLASE in the last 168 hours. No results for input(s): AMMONIA in the last 168 hours. Coagulation Profile: No results for input(s): INR, PROTIME in the last 168 hours. Cardiac Enzymes: No results for input(s): CKTOTAL, CKMB, CKMBINDEX, TROPONINI in the last 168 hours. Scheduled Meds: . arformoterol  15 mcg Nebulization BID  .  aspirin  81 mg Oral Daily  . bisacodyl  10 mg Rectal Daily  . budesonide (PULMICORT) nebulizer solution  0.5 mg Nebulization BID  . docusate sodium  200 mg Oral BID  . enoxaparin (LOVENOX) injection  40 mg Subcutaneous Q24H  . furosemide  40 mg Oral Daily  . ipratropium-albuterol  3 mL Nebulization Q6H  . methylPREDNISolone (SOLU-MEDROL) injection  80 mg Intravenous Q8H  . polyethylene glycol  17 g Oral BID  . raloxifene  60 mg Oral Daily  . sodium chloride flush  3 mL Intravenous Q12H   Continuous Infusions: . sodium chloride       LOS: 8 days   Time spent: 35 minutes.  Signature  Susa Raring M.D on 08/01/2017 at 9:04 AM  Between 7am to 7pm - Pager - [phone] ( page via [email or web address], text pages only, please mention full 10 digit call back number).  After 7pm go to [email or web address] - password [email or web address]

## 2017-08-01 NOTE — Progress Notes (Addendum)
Name: Katie DingwallVelma L Waller MRN: [id number] DOB: 11/20/1945    ADMISSION DATE:  07/24/2017 CONSULTATION DATE:  07/26/17  REFERRING MD :  Dr. Jarvis NewcomerGrunz / [facility]   CHIEF COMPLAINT:  Dyspnea   BRIEF HISTORY OF PRESENT ILLNESS:  71 y/o F, smoker (55 years, 1ppd), who [facility] with acute on chronic respiratory failure with hypoxemia due to COPD exacerbation.   SUBJECTIVE:   Off BIPAP this am   VITAL SIGNS: Temp:  [97.4 F (36.3 C)-98.9 F (37.2 C)] 98.1 F (36.7 C) (11/27 0740) Pulse Rate:  [88-119] 96 (11/27 0900) Resp:  [16-37] 22 (11/27 0902) BP: (114-176)/(65-89) 174/80 (11/27 0900) SpO2:  [81 %-99 %] 92 % (11/27 0902) FiO2 (%):  [30 %-40 %] 40 % (11/27 0902) Weight:  [138 lb 3.7 oz (62.7 kg)] 138 lb 3.7 oz (62.7 kg) (11/27 0600)  PHYSICAL EXAMINATION: General:   Mild distress HEENT: MM pink/moist Neuro: awake, FC CV: s1s2 rrr, no m/r/g PULM: even/non-labored, lungs bilaterally coarse XL:KGMWGI:soft, non-tender, bsx4 active  Extremities: warm/dry, min edema  Skin: no rashes or lesions  Recent Labs  Lab 07/31/17 0559 07/31/17 1757 08/01/17 0330  NA 138 142 142  K 5.6* 4.1 4.4  CL 90* 90* 90*  CO2 40* 44* 45*  BUN 29* 28* 30*  CREATININE 0.73 0.64 0.59  GLUCOSE 130* 197* 153*    Recent Labs  Lab 07/29/17 0252 07/30/17 0313 08/01/17 0330  HGB 13.8 13.6 13.4  HCT 43.7 44.4 44.9  WBC 8.7 9.3 11.5*  PLT 309 275 246    Ct Angio Chest Pe W Or Wo Contrast  Result Date: 07/31/2017 CLINICAL DATA:  Worsening dyspnea for 1 month EXAM: CT ANGIOGRAPHY CHEST WITH CONTRAST TECHNIQUE: Multidetector CT imaging of the chest was performed using the standard protocol during bolus administration of intravenous contrast. Multiplanar CT image reconstructions and MIPs were obtained to evaluate the vascular anatomy. CONTRAST:  100mL ISOVUE-370 IOPAMIDOL (ISOVUE-370) INJECTION 76% COMPARISON:  01/20/2017 FINDINGS: Cardiovascular: There are no filling defects in the pulmonary  arterial tree to suggest acute pulmonary thromboembolism. Extensive atherosclerotic calcifications of the aortic arch and descending thoracic aorta. There are atherosclerotic changes of the great vessels. There is prominent plaque at the origin of the left subclavian artery. Calcification makes quantification of narrowing difficult. Significant narrowing cannot be excluded. There is also calcified plaque at the origin of the vertebral arteries left greater than right. Moderate 3 vessel coronary artery calcification. There is no evidence of dissection or aortic aneurysm. Mediastinum/Nodes: No abnormal mediastinal adenopathy. No pericardial effusion. Visualized thyroid is unremarkable. Esophagus is unremarkable. Lungs/Pleura: Dependent atelectasis at the lung bases. Advanced panlobular emphysema. No pneumothorax or pleural effusion. Upper Abdomen: Low-density lesion in the lateral segment of the left lobe of the liver is stable at 15 mm. There are other low-density lesions scattered throughout the liver which were not visualized on the prior study but that is most likely due to technique. Musculoskeletal: No vertebral compression deformity. Review of the MIP images confirms the above findings. IMPRESSION: No evidence of acute pulmonary thromboembolism. Prominent atherosclerotic changes throughout the thorax. Significant narrowing at the origin of the left subclavian artery cannot be excluded. There are low-density lesions in the liver. The largest is stable. Other lesions were not clearly identified on the prior study. If there is a history of malignancy or risk of malignancy, consider six-month follow-up MRI. Aortic Atherosclerosis (ICD10-I70.0) and Emphysema (ICD10-J43.9). Electronically Signed   By: Jolaine ClickArthur  Hoss M.D.   On: 07/31/2017  14:12   Dg Chest Port 1 View  Result Date: 08/01/2017 CLINICAL DATA:  Respiratory failure EXAM: PORTABLE CHEST 1 VIEW COMPARISON:  07/30/2017 FINDINGS: Heart size and vascularity  normal. Lungs remain clear without infiltrate or effusion. Atherosclerotic aorta. IMPRESSION: No active disease. Electronically Signed   By: Marlan Palauharles  Clark M.D.   On: 08/01/2017 06:51     SIGNIFICANT EVENTS  11/19  Admit with SOB, suspected COPD exacerbation  STUDIES:  ECHO 11/20 >> LVEF 65-70%, no RWMA, grade 1 diastolic dysfunction CTA 11/26 >> no evidence of PE, advanced panlobular emphysema    ASSESSMENT / PLAN:  COPD exacerbation Acute resp failure Likely viral pneumonitis contribution Pulm edema contribution, some Severe centrilobular emphysema Tobacco abuse, ongoing Acute on chronic respiratory failure with hypercapnea Acute diastolic heart failure, agree  Discussion:  71 y/o F, current smoker, with advanced emphysema admitted with dyspnea.  She has been treated for infectious process, COPD exacerbation and PE has been ruled out.  Despite aggressive supportive measures, she has been unable to remain off BiPAP for more than one day.  I am concerned given the duration of her hospitalization that she is not responding to therapy.  DNR/DNI in the event of arrest at patients request.    Plan: Intermittent BiPAP for increased WOB > goal to cycle on / off  Negative balance as BP / renal function permit Feel her O2 needs are likely at baseline, 3L at rest Continue Brovana, Pulmicort, Duoneb  Continue solumedrol 80 mg IV Q8 Follow intermittent CXR  O2 for sats 88-94% (NO HIGHER, retains) Hold ARB Need to mobilize DNR / DNI    Canary BrimBrandi Ollis, NP-C [facility] Pgr: [phone] or if no answer [phone] 08/01/2017, 10:07 AM   STAFF NOTE: I, Rory Percyaniel Feinstein, MD FACP have personally reviewed patient's available data, including medical history, events of note, physical examination and test results as part of my evaluation. I have discussed with resident/NP and other care providers such as pharmacist, RN and RRT. In addition, I personally evaluated patient and  elicited key findings of: awake, less distress this am, JVD wnl, lungs distant air entry moderate, abdo soft, edema wnl, pcxr which I reviewed shows no well defined in infiltrates, CT chest I reviewed did not show PE, shows no PNA, chornic changes, goal is to limit BIPAP, appears that her resp failure is related to her underlying copd and viral illness starting exacerbation, steroid reduction slight to 60 q12h, Bders to remain, goal longer 2 hours off NIMV if able, she still remains to need NIMV scheduled, holding ARB, re attempt lasix to neg balance, I updated family in full Liver lesions, will US liver and corrleate findings, I also re confirmed DNR status with pt and family  Mcarthur RossettiDaniel J. Tyson AliasFeinstein, MD, FACP Pgr: [phone] [facility] 08/01/2017 10:55 AM

## 2017-08-01 NOTE — Progress Notes (Signed)
Date: August 01, 2017 Zaylie Gisler, BSN, RN3, CCM  [phone] Chart and notes review for patient progress and needs. Will follow for case management and discharge needs. Next review date: [id number] 

## 2017-08-01 NOTE — Progress Notes (Signed)
OT Cancellation Note  Patient Details Name: Katie Waller MRN: [id number] DOB: 12-16-1945   Cancelled Treatment:    Reason Eval/Treat Not Completed: Other (comment).  Pt is eating; will check back later today, if schedule permits.  [name] 08/01/2017, 2:03 PM  [name], OTR/L [phone] 08/01/2017

## 2017-08-01 NOTE — Consult Note (Signed)
Consultation Note Date: 08/01/2017   Patient Name: Katie Waller  DOB: February 22, 1946  MRN: [id number]  Age / Sex: 71 y.o., female  PCP: Katie Dus, MD Referring Physician: Thurnell Lose, MD  Reason for Consultation: Establishing goals of care  HPI/Patient Profile: 71 y.o. female  with past medical history of COPD, tobacco abuse and DM who was admitted on 07/24/2017 with hypoxemia due to COPD exacerbation.  She has received intensive hospital treatment for 8 days without significant improvement. She remains on bipap intermittently and her PO intake is poor.  She is a DNR. Of note the patient does not have a pulmonologist and refuses to use oxygen at home.  Palliative Medicine has been consulted for further goals of care.    Clinical Assessment and Goals of Care:  I have reviewed medical records including EPIC notes, labs and imaging, received report from CCM, assessed the patient and then met at the bedside along with her son and daughter to discuss diagnosis prognosis, GOC, EOL wishes, disposition and options.  I introduced Palliative Medicine as specialized medical care for people living with serious illness. It focuses on providing relief from the symptoms and stress of a serious illness. The goal is to improve quality of life for both the patient and the family.  As far as functional and nutritional status the patient lives at home with her daughter.  She is independent with ADLs and drives to work every day.  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  Her work appears to be her passion.  Her son and daughter work in [employer] brokerage with her.  Her children report that each day she has been in the hospital she has been concerned with getting back to work.  The patient's daughter, Katie Waller, describes her as "Fredderick Severance", very organized and very independent.  She is able to walk from room to room at  home but becomes very short of breath with further exertion or when trying to do any house work.  We discussed their current illness and what it means in the larger context of their on-going co-morbidities.  Natural disease trajectory and expectations at EOL were discussed.  Specifically we discussed her severe emphysema in combination with a degree of diastolic heart failure. The children asked about a lung transplant - but they understand that insurance will not pay for this in a person over 63 yo.  Her children say that while their mother is very prepared to die (funeral arrangements already made and paid for) however,  they only have one mother and that prior to making her "comfort care" they want to seek a second opinion from [facility].    Primary Decision Maker:  PATIENT along with her son and daughter.    SUMMARY OF RECOMMENDATIONS     Will continue current care as she seems to be improving today.  Full DNR/DNI.  Patient's decision and children are supportive.  Family is aware she is very ill but will not consider comfort care at this point.  Full scope treatment   If patient successfully discharges - she will likely need SNF  Symptom Management:   Per primary team  Psycho-social/Spiritual:   Desire for further Chaplaincy support: yes  Prognosis:  Given pan-lobar emphysema which appears to be treatment resistant and her current very deconditioned state, she likely has less than 6 months if she successfully comes thru this severe exacerbation.    Discharge Planning: To Be Determined.  Will likely need SNF with Palliative      Primary Diagnoses: Present on Admission: . COPD exacerbation ([place]) . Acute respiratory failure ([place]) . Sinus tachycardia . Smoker . Essential hypertension . Chronic diastolic CHF (congestive heart failure) ([place])   I have reviewed the medical record, interviewed the patient and family, and examined the patient. The following aspects are  pertinent.  Past Medical History:  Diagnosis Date  . Asthma   . Diabetes ([place])   . Heart disease   . Hypertension    Social History   Socioeconomic History  . Marital status: Divorced    Spouse name: None  . Number of children: None  . Years of education: None  . Highest education level: None  Social Needs  . Financial resource strain: None  . Food insecurity - worry: None  . Food insecurity - inability: None  . Transportation needs - medical: None  . Transportation needs - non-medical: None  Occupational History  . None  Tobacco Use  . Smoking status: Current Every Day Smoker    Types: Cigarettes  . Smokeless tobacco: Never Used  . Tobacco comment: vapor  Substance and Sexual Activity  . Alcohol use: No  . Drug use: No  . Sexual activity: No  Other Topics Concern  . None  Social History Narrative  . None   Family History  Problem Relation Age of Onset  . Hyperlipidemia Mother   . Hypertension Mother   . Hypertension Father   . Hyperlipidemia Father    Scheduled Meds: . arformoterol  15 mcg Nebulization BID  . aspirin  81 mg Oral Daily  . bisacodyl  10 mg Rectal Daily  . budesonide (PULMICORT) nebulizer solution  0.5 mg Nebulization BID  . docusate sodium  200 mg Oral BID  . enoxaparin (LOVENOX) injection  40 mg Subcutaneous Q24H  . furosemide  40 mg Intravenous Once  . [START ON 08/02/2017] furosemide  40 mg Oral Daily  . ipratropium-albuterol  3 mL Nebulization Q6H  . methylPREDNISolone (SOLU-MEDROL) injection  80 mg Intravenous Q8H  . polyethylene glycol  17 g Oral BID  . raloxifene  60 mg Oral Daily  . sodium chloride flush  3 mL Intravenous Q12H   Continuous Infusions: . sodium chloride     PRN Meds:.sodium chloride, acetaminophen **OR** acetaminophen, benzonatate, ipratropium-albuterol, labetalol, LORazepam, ondansetron **OR** ondansetron (ZOFRAN) IV, sodium chloride flush, traZODone Allergies  Allergen Reactions  . Ace Inhibitors Swelling  .  Codeine Nausea And Vomiting   Review of Systems patient asleep on bipap, does not wake to exam or voice.  Physical Exam  Well developed female, asleep on bipap. CV rrr no frank m/r/g Abdomen soft, NT, ND   Vital Signs: BP (!) 174/80 Comment: Administered PRN  Pulse 96   Temp 98.1 F (36.7 C) (Axillary)   Resp (!) 22   Ht '5\' 1"'$  (1.549 m)   Wt 62.7 kg (138 lb 3.7 oz)   SpO2 92%   BMI 26.12 kg/m  Pain Assessment: No/denies pain POSS *See Group Information*: 1-Acceptable,Awake and alert  Pain Score: Asleep   SpO2: SpO2: 92 % O2 Device:SpO2: 92 % O2 Flow Rate: .O2 Flow Rate (L/min): 3 L/min  IO: Intake/output summary:   Intake/Output Summary (Last 24 hours) at 08/01/2017 0958 Last data filed at 08/01/2017 0910 Gross per 24 hour  Intake 3 ml  Output -  Net 3 ml    LBM: Last BM Date: 07/29/17 Baseline Weight: Weight: 61.2 kg (135 lb) Most recent weight: Weight: 62.7 kg (138 lb 3.7 oz)     Palliative Assessment/Data:  30%      Time In: 10:00 Time Out: 11:10 Time Total: 70 min Greater than 50%  of this time was spent counseling and coordinating care related to the above assessment and plan.  Signed by: Florentina Jenny, PA-C Palliative Medicine Pager: [phone]  Please contact Palliative Medicine Team phone at [phone] for questions and concerns.  For individual provider: See Shea Evans

## 2017-08-01 NOTE — Progress Notes (Signed)
Patient placed back on BiPAP for increased WOB. Patient tolerating well at this time. RT will continue to monitor patient.

## 2017-08-01 NOTE — Progress Notes (Signed)
Pt found off bipap and on 3lnc.  HR110, RR24, spo2 95%.  Little to no increase wob noted, pt denies sob at this time.  Bipap remains off per patient's request but in room on standby.  RT will continue to monitor and assess pt as needed.

## 2017-08-02 ENCOUNTER — Inpatient Hospital Stay (HOSPITAL_COMMUNITY): Payer: Medicare Other

## 2017-08-02 DIAGNOSIS — R160 Hepatomegaly, not elsewhere classified: Secondary | ICD-10-CM

## 2017-08-02 LAB — BASIC METABOLIC PANEL
Anion gap: 8 (ref 5–15)
BUN: 31 mg/dL — ABNORMAL HIGH (ref 6–20)
CALCIUM: 8.9 mg/dL (ref 8.9–10.3)
CHLORIDE: 89 mmol/L — AB (ref 101–111)
CO2: 46 mmol/L — ABNORMAL HIGH (ref 22–32)
CREATININE: 0.52 mg/dL (ref 0.44–1.00)
GFR calc Af Amer: >60 mL/min (ref >60)
GFR calc non Af Amer: >60 mL/min (ref >60)
Glucose, Bld: 143 mg/dL — ABNORMAL HIGH (ref 65–99)
POTASSIUM: 4.2 mmol/L (ref 3.5–5.1)
SODIUM: 143 mmol/L (ref 135–145)

## 2017-08-02 LAB — CBC
HCT: 44.2 % (ref 36.0–46.0)
HEMOGLOBIN: 13.6 g/dL (ref 12.0–15.0)
MCH: 29.6 pg (ref 26.0–34.0)
MCHC: 30.8 g/dL (ref 30.0–36.0)
MCV: 96.1 fL (ref 78.0–100.0)
PLATELETS: 225 10*3/uL (ref 150–400)
RBC: 4.6 MIL/uL (ref 3.87–5.11)
RDW: 14 % (ref 11.5–15.5)
WBC: 13.7 10*3/uL — ABNORMAL HIGH (ref 4.0–10.5)

## 2017-08-02 LAB — MAGNESIUM: MAGNESIUM: 2.4 mg/dL (ref 1.7–2.4)

## 2017-08-02 MED ORDER — METHYLPREDNISOLONE SODIUM SUCC 40 MG IJ SOLR
40.0000 mg | Freq: Two times a day (BID) | INTRAMUSCULAR | Status: DC
  Administered 2017-08-02 – 2017-08-03 (×2): 40 mg via INTRAVENOUS
  Filled 2017-08-02 (×2): qty 1

## 2017-08-02 MED ORDER — NICOTINE 21 MG/24HR TD PT24
21.0000 mg | MEDICATED_PATCH | Freq: Every day | TRANSDERMAL | Status: DC
  Administered 2017-08-02 – 2017-08-07 (×6): 21 mg via TRANSDERMAL
  Filled 2017-08-02 (×6): qty 1

## 2017-08-02 NOTE — Progress Notes (Signed)
Name: Katie Waller MRN: [id number] DOB: 1945/11/17    ADMISSION DATE:  07/24/2017 CONSULTATION DATE:  07/26/17  REFERRING MD :  Dr. Jarvis Newcomer / [facility]   CHIEF COMPLAINT:  Dyspnea   BRIEF HISTORY OF PRESENT ILLNESS:  71 y/o F, smoker (55 years, 1ppd), who presented to [facility] on 11/19 with acute on chronic respiratory failure with hypoxemia due to COPD exacerbation.   SUBJECTIVE:   Currently on BiPAP.  Cycles on and off for approximately an hour 2 times a day  VITAL SIGNS: Temp:  [97.5 F (36.4 C)-98.5 F (36.9 C)] 98.1 F (36.7 C) (11/28 0800) Pulse Rate:  [83-102] 93 (11/28 0900) Resp:  [12-23] 12 (11/28 0900) BP: (132-180)/(56-102) 163/79 (11/28 0900) SpO2:  [90 %-100 %] 99 % (11/28 0900) FiO2 (%):  [30 %-40 %] 30 % (11/28 0800) Weight:  [139 lb 15.9 oz (63.5 kg)] 139 lb 15.9 oz (63.5 kg) (11/28 0500)  PHYSICAL EXAMINATION: General: Elderly female currently on noninvasive mechanical ventilatory support with no acute distress  HEENT: Currently with noninvasive facemask in place, no JVD appreciated PSY: Dull effect Neuro: Intact CV: Heart sounds are regular no m/r/g PULM: Good pulmonary excursion while on noninvasive WU:JWJX, non-tender, bsx4 active  Extremities: warm/dry, plus edema  Skin: no rashes or lesions   Recent Labs  Lab 07/31/17 1757 08/01/17 0330 08/02/17 0338  NA 142 142 143  K 4.1 4.4 4.2  CL 90* 90* 89*  CO2 44* 45* 46*  BUN 28* 30* 31*  CREATININE 0.64 0.59 0.52  GLUCOSE 197* 153* 143*    Recent Labs  Lab 07/30/17 0313 08/01/17 0330 08/02/17 0338  HGB 13.6 13.4 13.6  HCT 44.4 44.9 44.2  WBC 9.3 11.5* 13.7*  PLT 275 246 225    Ct Angio Chest Pe W Or Wo Contrast  Result Date: 07/31/2017 CLINICAL DATA:  Worsening dyspnea for 1 month EXAM: CT ANGIOGRAPHY CHEST WITH CONTRAST TECHNIQUE: Multidetector CT imaging of the chest was performed using the standard protocol during bolus administration of intravenous contrast. Multiplanar CT image  reconstructions and MIPs were obtained to evaluate the vascular anatomy. CONTRAST:  ISOVUE-370 IOPAMIDOL (ISOVUE-370) INJECTION 76% COMPARISON:  01/20/2017 FINDINGS: Cardiovascular: There are no filling defects in the pulmonary arterial tree to suggest acute pulmonary thromboembolism. Extensive atherosclerotic calcifications of the aortic arch and descending thoracic aorta. There are atherosclerotic changes of the great vessels. There is prominent plaque at the origin of the left subclavian artery. Calcification makes quantification of narrowing difficult. Significant narrowing cannot be excluded. There is also calcified plaque at the origin of the vertebral arteries left greater than right. Moderate 3 vessel coronary artery calcification. There is no evidence of dissection or aortic aneurysm. Mediastinum/Nodes: No abnormal mediastinal adenopathy. No pericardial effusion. Visualized thyroid is unremarkable. Esophagus is unremarkable. Lungs/Pleura: Dependent atelectasis at the lung bases. Advanced panlobular emphysema. No pneumothorax or pleural effusion. Upper Abdomen: Low-density lesion in the lateral segment of the left lobe of the liver is stable at 15 mm. There are other low-density lesions scattered throughout the liver which were not visualized on the prior study but that is most likely due to technique. Musculoskeletal: No vertebral compression deformity. Review of the MIP images confirms the above findings. IMPRESSION: No evidence of acute pulmonary thromboembolism. Prominent atherosclerotic changes throughout the thorax. Significant narrowing at the origin of the left subclavian artery cannot be excluded. There are low-density lesions in the liver. The largest is stable. Other lesions were not clearly identified on the prior study.  If there is a history of malignancy or risk of malignancy, consider six-month follow-up MRI. Aortic Atherosclerosis (ICD10-I70.0) and Emphysema (ICD10-J43.9).  Electronically Signed   By: Jolaine ClickArthur  Hoss M.D.   On: 07/31/2017 14:12   [place] Abdomen Limited  Result Date: 08/02/2017 CLINICAL DATA:  Liver mass on CT EXAM: ULTRASOUND ABDOMEN LIMITED RIGHT UPPER QUADRANT COMPARISON:  Chest CT 07/31/2017 FINDINGS: Gallbladder: No gallstones or wall thickening visualized. No sonographic Murphy sign noted by sonographer. Common bile duct: Diameter: Normal caliber, 3 mm Liver: Small cysts in the left hepatic lobe corresponding to the abnormalities seen on chest CT. These measure 1.3 cm and 0.9 cm maximally and appeared represent benign cysts. Portal vein is patent on color Doppler imaging with normal direction of blood flow towards the liver. IMPRESSION: Small cystic areas within the left hepatic lobe as above, most compatible with benign cysts. These could be followed with repeat ultrasound in 6-12 months to ensure stability. Electronically Signed   By: Charlett NoseKevin  Dover M.D.   On: 08/02/2017 09:08   Dg Chest Port 1 View  Result Date: 08/01/2017 CLINICAL DATA:  Respiratory failure EXAM: PORTABLE CHEST 1 VIEW COMPARISON:  07/30/2017 FINDINGS: Heart size and vascularity normal. Lungs remain clear without infiltrate or effusion. Atherosclerotic aorta. IMPRESSION: No active disease. Electronically Signed   By: Marlan Palauharles  Clark M.D.   On: 08/01/2017 06:51     SIGNIFICANT EVENTS  11/19  Admit with SOB, suspected COPD exacerbation  STUDIES:  ECHO 11/20 >> LVEF 65-70%, no RWMA, grade 1 diastolic dysfunction CTA 11/26 >> no evidence of PE, advanced panlobular emphysema    ASSESSMENT / PLAN:  COPD exacerbation Acute resp failure Likely viral pneumonitis contribution Pulm edema contribution, some Severe centrilobular emphysema Tobacco abuse, ongoing Acute on chronic respiratory failure with hypercapnea Acute diastolic heart failure, agree  Discussion:  71 y/o F, current smoker, with advanced emphysema admitted with dyspnea.  She has been treated for infectious process,  COPD exacerbation and PE has been ruled out.  Despite aggressive supportive measures, she has been unable to remain off BiPAP for more than one day.  I am concerned given the duration of her hospitalization that she is not responding to therapy.  DNR/DNI in the event of arrest at patients request.  08/02/2017 no real change in her need for noninvasive mechanical ventilatory support.  Plan: Intermittent BiPAP for increased WOB > goal to cycle on / off 08/01/2017.  Today she is on noninvasive mechanical ventilatory support x2 for approximately 1 hour each time. Negative balance as BP / renal function permit  Intake/Output Summary (Last 24 hours) at 08/02/2017 1111 Last data filed at 08/02/2017 0600 Gross per 24 hour  Intake -  Output 400 ml  Net -400 ml    Feel her O2 needs are likely at baseline, 3L at rest Continue Brovana, Pulmicort, Duoneb  Continue solumedrol 80 mg IV Q8 Follow intermittent CXR no chest x-ray from 08/02/2017, will order chest x-ray for a.m. 08/04/1999 O2 for sats 88-94% (NO HIGHER, retains) Hold ARB Need to mobilize DNR / DNI confirmed DNR/DNI 08/02/2017  Brett CanalesSteve Minor ACNP [facility] P[facility] Pager [phone] till 1 pm If no answer page [phone] 08/02/2017, 11:11 AM   STAFF NOTE: I, Rory Percyaniel Feinstein, MD FACP have personally reviewed patient's available data, including medical history, events of note, physical examination and test results as part of my evaluation. I have discussed with resident/NP and other care providers such as pharmacist, RN and RRT. In addition, I personally evaluated patient and  elicited key findings of: awake, off BIPAP since 845 am , mild distress, jvd down slowly, reduced BS , better aeration, abdo soft, mild edema, was neg 1 liter and we are making progress with time off BIPAP, US I reviewed shows cysts liver will nee dUS follow up, she tolerate lasix well, some caution with ct angio with contrast that was done, may want to lower lasix slight,  Bder regimen to maintain, solumedral to 40 Mg, she has made progress and should push for 4-6 hours off and maintain nocturnal use for now, I updated familyin full, ativan for anxiety, abx course received, no PE present   Mcarthur Rossettianiel J. Tyson AliasFeinstein, MD, FACP Pgr: [phone] [facility] 08/02/2017 12:00 PM

## 2017-08-02 NOTE — Progress Notes (Signed)
[facility]  PROGRESS NOTE  Katie Waller ZOX:[id number] DOB: 1945-11-13 DOA: 07/24/2017 PCP: Elias Else, MD   Brief HPI:   71 year old female with history of COPD not adherent to prescribed home oxygen, asthma, diabetes mellitus, hypertension, active tobacco use presented with worsening dyspnea over the past month. CXR showed hyperinflation and diaphragm flattening with no infiltrate. She was admitted with COPD exacerbation. Echo showed EF 65-70%, G1DD with normal RV and PA caliber/pressures. Work of breathing remained elevated as of 11/21 despite IV steroids, azithromycin, and nebulizer treatments, prompting use of BiPAP and transfer to SDU. Pulmonology consulted. She has continued to require NIPPV intermittently.      Subjective   Patient continues to have shortness of breath, was taken off BiPAP this morning.  She denies chest pain.   Assessment/Plan:     1. Acute on chronic hypoxic/hypercapnic respiratory failure-secondary to COPD noncompliant with home oxygen, tobacco abuse.  CT was negative for PE, chest x-ray was negative for infiltrate.  CCM consulted patient currently on IV steroids, nebulizer treatment. 2. Acute on chronic diastolic CHF-patient was started on IV Lasix, the dose of Lasix has been changed to 40 mg p.o daily. by P CCM.  Follow BMP in a.m. 3. Essential hypertension-continue Lasix, losartan is on hold. 4. Tobacco use-counseled to quit, continue nicotine patch 5. Nonspecific CT finding of liver density-ultrasound of the liver showed small cystic areas within the left hepatic lobe most compatible with a benign cyst.  Repeat ultrasound in 6-12 months to ensure stability.    DVT prophylaxis: Lovenox  Code Status: DNR  Family Communication: Discussed with patient's daughter at bedside  Disposition Plan: Pending improvement in patient's respiratory status    Consultants:  Patient centimeters  Procedures:  None  Continuous infusions . sodium  chloride        Antibiotics:   Anti-infectives (From admission, onward)   Start     Dose/Rate Route Frequency Ordered Stop   07/24/17 1730  azithromycin (ZITHROMAX) tablet 500 mg     500 mg Oral Daily 07/24/17 1647 07/28/17 1051       Objective   Vitals:   08/02/17 0845 08/02/17 0900 08/02/17 1200 08/02/17 1418  BP:  (!) 163/79 132/72   Pulse: 94 93 (!) 102   Resp: 18 12 (!) 24   Temp:   98.1 F (36.7 C)   TempSrc:   Oral   SpO2: 100% 99% 100% 100%  Weight:      Height:        Intake/Output Summary (Last 24 hours) at 08/02/2017 1622 Last data filed at 08/02/2017 0600 Gross per 24 hour  Intake -  Output 400 ml  Net -400 ml   Filed Weights   07/31/17 0500 08/01/17 0600 08/02/17 0500  Weight: 63.1 kg (139 lb 1.8 oz) 62.7 kg (138 lb 3.7 oz) 63.5 kg (139 lb 15.9 oz)     Physical Examination:   Physical Exam: Eyes: No icterus, extraocular muscles intact  Mouth: Oral mucosa is moist, no lesions on palate,  Neck: Supple, no deformities, masses, or tenderness Lungs: Decreased breath sounds bilaterally, tachypnea Heart: Regular rate and rhythm, S1 and S2 normal, no murmurs, rubs auscultated Abdomen: BS normoactive,soft,nondistended,non-tender to palpation,no organomegaly Extremities: No pretibial edema, no erythema, no cyanosis, no clubbing Neuro : Alert and oriented to time, place and person, No focal deficits Skin: No rashes seen on exam    Data Reviewed: I have personally reviewed following labs and imaging studies  CBG: No results for input(s):  GLUCAP in the last 168 hours.  CBC: Recent Labs  Lab 07/29/17 0252 07/30/17 0313 08/01/17 0330 08/02/17 0338  WBC 8.7 9.3 11.5* 13.7*  NEUTROABS  --  8.4*  --   --   HGB 13.8 13.6 13.4 13.6  HCT 43.7 44.4 44.9 44.2  MCV 94.2 95.5 98.7 96.1  PLT 309 275 246 225    Basic Metabolic Panel: Recent Labs  Lab 07/30/17 0313 07/31/17 0559 07/31/17 1757 08/01/17 0330 08/02/17 0338  NA 140 138 142 142 143   K 4.2 5.6* 4.1 4.4 4.2  CL 89* 90* 90* 90* 89*  CO2 43* 40* 44* 45* 46*  GLUCOSE 146* 130* 197* 153* 143*  BUN 32* 29* 28* 30* 31*  CREATININE 0.79 0.73 0.64 0.59 0.52  CALCIUM 8.6* 8.7* 8.5* 8.7* 8.9  MG  --   --   --  2.5* 2.4    Recent Results (from the past 240 hour(s))  MRSA PCR Screening     Status: None   Collection Time: 07/26/17  9:19 PM  Result Value Ref Range Status   MRSA by PCR NEGATIVE NEGATIVE Final    Comment:        The GeneXpert MRSA Assay (FDA approved for NASAL specimens only), is one component of a comprehensive MRSA colonization surveillance program. It is not intended to diagnose MRSA infection nor to guide or monitor treatment for MRSA infections.      Liver Function Tests: No results for input(s): AST, ALT, ALKPHOS, BILITOT, PROT, ALBUMIN in the last 168 hours. No results for input(s): LIPASE, AMYLASE in the last 168 hours. No results for input(s): AMMONIA in the last 168 hours.  Cardiac Enzymes: No results for input(s): CKTOTAL, CKMB, CKMBINDEX, TROPONINI in the last 168 hours. BNP (last 3 results) Recent Labs    07/24/17 1100  BNP 84.9    ProBNP (last 3 results) No results for input(s): PROBNP in the last 8760 hours.    Studies: [place] Abdomen Limited  Result Date: 08/02/2017 CLINICAL DATA:  Liver mass on CT EXAM: ULTRASOUND ABDOMEN LIMITED RIGHT UPPER QUADRANT COMPARISON:  Chest CT 07/31/2017 FINDINGS: Gallbladder: No gallstones or wall thickening visualized. No sonographic Murphy sign noted by sonographer. Common bile duct: Diameter: Normal caliber, 3 mm Liver: Small cysts in the left hepatic lobe corresponding to the abnormalities seen on chest CT. These measure 1.3 cm and 0.9 cm maximally and appeared represent benign cysts. Portal vein is patent on color Doppler imaging with normal direction of blood flow towards the liver. IMPRESSION: Small cystic areas within the left hepatic lobe as above, most compatible with benign cysts. These  could be followed with repeat ultrasound in 6-12 months to ensure stability. Electronically Signed   By: Charlett NoseKevin  Dover M.D.   On: 08/02/2017 09:08   Dg Chest Port 1 View  Result Date: 08/01/2017 CLINICAL DATA:  Respiratory failure EXAM: PORTABLE CHEST 1 VIEW COMPARISON:  07/30/2017 FINDINGS: Heart size and vascularity normal. Lungs remain clear without infiltrate or effusion. Atherosclerotic aorta. IMPRESSION: No active disease. Electronically Signed   By: Marlan Palauharles  Clark M.D.   On: 08/01/2017 06:51    Scheduled Meds: . arformoterol  15 mcg Nebulization BID  . aspirin  81 mg Oral Daily  . bisacodyl  10 mg Rectal Daily  . budesonide (PULMICORT) nebulizer solution  0.5 mg Nebulization BID  . docusate sodium  200 mg Oral BID  . enoxaparin (LOVENOX) injection  40 mg Subcutaneous Q24H  . furosemide  40 mg Oral Daily  .  ipratropium-albuterol  3 mL Nebulization Q6H  . methylPREDNISolone (SOLU-MEDROL) injection  40 mg Intravenous Q12H  . nicotine  21 mg Transdermal Daily  . polyethylene glycol  17 g Oral BID  . raloxifene  60 mg Oral Daily  . sodium chloride flush  3 mL Intravenous Q12H      Time spent: 25 min  Meredeth IdeGagan S Ronasia Isola   [facility] Pager [phone]. If 7PM-7AM, please contact night-coverage at [email or web address], Office  [phone]  password [email or web address]  08/02/2017, 4:22 PM  LOS: 9 days

## 2017-08-02 NOTE — Progress Notes (Signed)
Daily Progress Note   Patient Name: Katie Waller       Date: 08/02/2017 DOB: 22-Jan-1946  Age: 71 y.o. MRN#: [id number] Attending Physician: Meredeth IdeLama, Gagan S, MD Primary Care Physician: Elias Elseeade, Robert, MD Admit Date: 07/24/2017  Reason for Consultation/Follow-up: Establishing goals of care  Subjective: Patient awake and alert.  Unable to speak in sentences due to SOB.  Dtr at bedside.  Patient states I'm going to get better and get out of here.  Patient and daughter are pleasant and hopeful.   Assessment: [age] yof with on-going tobacco use with severe panlobar emphysema.  Slowly recovering.   Goal is for recovery.   Patient Profile/HPI:  71 y.o. female  with past medical history of COPD, tobacco abuse and DM who was admitted on 07/24/2017 with hypoxemia due to COPD exacerbation.  She has received intensive hospital treatment for 8 days without significant improvement. She remains on bipap intermittently and her PO intake is poor.  She is a DNR. Of note the patient does not have a pulmonologist and refuses to use oxygen at home.  Palliative Medicine has been consulted for further goals of care.        Length of Stay: 9  Current Medications: Scheduled Meds:  . arformoterol  15 mcg Nebulization BID  . aspirin  81 mg Oral Daily  . bisacodyl  10 mg Rectal Daily  . budesonide (PULMICORT) nebulizer solution  0.5 mg Nebulization BID  . docusate sodium  200 mg Oral BID  . enoxaparin (LOVENOX) injection  40 mg Subcutaneous Q24H  . furosemide  40 mg Oral Daily  . ipratropium-albuterol  3 mL Nebulization Q6H  . methylPREDNISolone (SOLU-MEDROL) injection  60 mg Intravenous Q12H  . nicotine  21 mg Transdermal Daily  . polyethylene glycol  17 g Oral BID  . raloxifene  60 mg Oral Daily  .  sodium chloride flush  3 mL Intravenous Q12H    Continuous Infusions: . sodium chloride      PRN Meds: sodium chloride, acetaminophen **OR** acetaminophen, benzonatate, ipratropium-albuterol, labetalol, LORazepam, ondansetron **OR** ondansetron (ZOFRAN) IV, sodium chloride flush, traZODone  Physical Exam       Well developed female awake alert unable to speak in complete sentences due to SOB CV tachy resp mod increased work of breathing at rest. Abdomen soft, nt, nd  Vital Signs: BP (!) 163/79   Pulse 93   Temp 98.1 F (36.7 C) (Axillary)   Resp 12   Ht 5\' 1"  (1.549 m)   Wt 63.5 kg (139 lb 15.9 oz)   SpO2 99%   BMI 26.45 kg/m  SpO2: SpO2: 99 % O2 Device: O2 Device: Nasal Cannula O2 Flow Rate: O2 Flow Rate (L/min): 3 L/min  Intake/output summary:   Intake/Output Summary (Last 24 hours) at 08/02/2017 1202 Last data filed at 08/02/2017 0600 Gross per 24 hour  Intake -  Output 400 ml  Net -400 ml   LBM: Last BM Date: 08/01/17 Baseline Weight: Weight: 61.2 kg (135 lb) Most recent weight: Weight: 63.5 kg (139 lb 15.9 oz)       Palliative Assessment/Data:40%    Flowsheet Rows     Most Recent Value  Intake Tab  Referral Department  Hospitalist  Unit at Time of Referral  Med/Surg Unit  Palliative Care Primary Diagnosis  Cardiac  Date Notified  08/01/17  Palliative Care Type  New Palliative care  Reason for referral  Clarify Goals of Care  Date of Admission  07/24/17  Date first seen by Palliative Care  08/01/17  # of days Palliative referral response time  0 Day(s)  # of days IP prior to Palliative referral  8  Clinical Assessment  Psychosocial & Spiritual Assessment  Palliative Care Outcomes      Patient Active Problem List   Diagnosis Date Noted  . Acute on chronic respiratory failure with hypercapnia (HCC)   . Palliative care encounter   . Goals of care, counseling/discussion   . Chronic diastolic CHF (congestive heart failure) (HCC) 07/28/2017  .  Venous stasis of both lower extremities 06/13/2016  . Acute respiratory failure (HCC) 06/08/2016  . Abnormal finding on EKG 09/19/2013  . Prediabetes 09/19/2013  . Sinus tachycardia 09/18/2013  . Essential hypertension 09/18/2013  . Near syncope 09/17/2013  . COPD exacerbation (HCC) 09/17/2013  . Smoker 09/17/2013    Palliative Care Plan    Recommendations/Plan:  Full scope treatment other than DNR/DNI  As she continues to slow improve Palliative will follow intermittently.  She will need smoking cessation and compliance with pulmonary care follow up  Goals of Care and Additional Recommendations:  Limitations on Scope of Treatment: Full Scope Treatment  Code Status:  DNR  Prognosis:   Unable to determine   Discharge Planning:  To Be Determined  Care plan was discussed with Pulm Critical Care and patient  Thank you for allowing the Palliative Medicine Team to assist in the care of this patient.  Total time spent:  15 min     Greater than 50%  of this time was spent counseling and coordinating care related to the above assessment and plan.  Norvel RichardsMarianne Omni Dunsworth, PA-C Palliative Medicine  Please contact Palliative MedicineTeam phone at [phone] for questions and concerns between 7 am - 7 pm.   Please see AMION for individual provider pager numbers.

## 2017-08-03 ENCOUNTER — Inpatient Hospital Stay (HOSPITAL_COMMUNITY): Payer: Medicare Other

## 2017-08-03 LAB — BASIC METABOLIC PANEL
ANION GAP: 5 (ref 5–15)
BUN: 29 mg/dL — ABNORMAL HIGH (ref 6–20)
CALCIUM: 8.8 mg/dL — AB (ref 8.9–10.3)
CHLORIDE: 94 mmol/L — AB (ref 101–111)
CO2: 45 mmol/L — AB (ref 22–32)
Creatinine, Ser: 0.5 mg/dL (ref 0.44–1.00)
GFR calc non Af Amer: >60 mL/min (ref >60)
GLUCOSE: 127 mg/dL — AB (ref 65–99)
Potassium: 4.7 mmol/L (ref 3.5–5.1)
Sodium: 144 mmol/L (ref 135–145)

## 2017-08-03 LAB — CBC
HEMATOCRIT: 42.9 % (ref 36.0–46.0)
HEMOGLOBIN: 13.4 g/dL (ref 12.0–15.0)
MCH: 29.5 pg (ref 26.0–34.0)
MCHC: 31.2 g/dL (ref 30.0–36.0)
MCV: 94.5 fL (ref 78.0–100.0)
Platelets: 211 10*3/uL (ref 150–400)
RBC: 4.54 MIL/uL (ref 3.87–5.11)
RDW: 14.2 % (ref 11.5–15.5)
WBC: 12 10*3/uL — AB (ref 4.0–10.5)

## 2017-08-03 LAB — PHOSPHORUS: PHOSPHORUS: 3.5 mg/dL (ref 2.5–4.6)

## 2017-08-03 LAB — MAGNESIUM: Magnesium: 2.4 mg/dL (ref 1.7–2.4)

## 2017-08-03 MED ORDER — METHYLPREDNISOLONE SODIUM SUCC 40 MG IJ SOLR
40.0000 mg | Freq: Every day | INTRAMUSCULAR | Status: DC
  Administered 2017-08-04 – 2017-08-07 (×4): 40 mg via INTRAVENOUS
  Filled 2017-08-03 (×4): qty 1

## 2017-08-03 NOTE — Plan of Care (Signed)
  Progressing Clinical Measurements: Ability to maintain clinical measurements within normal limits will improve 08/03/2017 1858 - Progressing by Burna SisZavaleta Catalan, Cindia Hustead G, RN Will remain free from infection 08/03/2017 1858 - Progressing by Burna SisZavaleta Catalan, Amun Stemm G, RN Diagnostic test results will improve 08/03/2017 1858 - Progressing by Burna SisZavaleta Catalan, Sherrie Marsan G, RN Respiratory complications will improve 08/03/2017 1858 - Progressing by Burna SisZavaleta Catalan, Adylene Dlugosz G, RN Cardiovascular complication will be avoided 08/03/2017 1858 - Progressing by Burna SisZavaleta Catalan, Petrina Melby G, RN Activity: Risk for activity intolerance will decrease 08/03/2017 1858 - Progressing by Burna SisZavaleta Catalan, Bernadine Melecio G, RN Nutrition: Adequate nutrition will be maintained 08/03/2017 1858 - Progressing by Burna SisZavaleta Catalan, Rip Hawes G, RN Coping: Level of anxiety will decrease 08/03/2017 1858 - Progressing by Burna SisZavaleta Catalan, Bobby Ragan G, RN Skin Integrity: Risk for impaired skin integrity will decrease 08/03/2017 1858 - Progressing by Burna SisZavaleta Catalan, Toya Palacios G, RN   Adequate for Discharge Health Behavior/Discharge Planning: Ability to manage health-related needs will improve 08/03/2017 1858 - Adequate for Discharge by Burna SisZavaleta Catalan, Ladrea Holladay G, RN

## 2017-08-03 NOTE — Progress Notes (Signed)
Name: Katie DingwallVelma L Waller MRN: [id number] DOB: 22-Jun-1946    ADMISSION DATE:  07/24/2017 CONSULTATION DATE:  07/26/17  REFERRING MD :  Dr. Jarvis NewcomerGrunz / [facility]   CHIEF COMPLAINT:  Dyspnea   BRIEF HISTORY OF PRESENT ILLNESS:  71 y/o F, smoker (55 years, 1ppd), who [facility] with acute on chronic respiratory failure with hypoxemia due to COPD exacerbation.   SUBJECTIVE:  Daughter reports pt went without bipap for most of the Waller yesterday.  [birth year] birthday.  On 3L O2.  No acute events overnight.  Daughter reports concern that the ativan may be making her sleepy during the Waller.    VITAL SIGNS: Temp:  [97.5 F (36.4 C)-98.8 F (37.1 C)] 98.8 F (37.1 C) (11/29 0800) Pulse Rate:  [95-111] 96 (11/29 0600) Resp:  [17-32] 23 (11/29 0600) BP: (96-175)/(58-88) 166/78 (11/29 0600) SpO2:  [89 %-100 %] 96 % (11/29 0846) FiO2 (%):  [30 %] 30 % (11/29 0400) Weight:  [134 lb 7.7 oz (61 kg)] 134 lb 7.7 oz (61 kg) (11/29 0500)  PHYSICAL EXAMINATION: General: chronically ill appearing adult female in NAD  HEENT: MM pink/moist, no jvd PSY: calm/appropriate Neuro: AAOx4, speech clear CV: s1s2 rrr, no m/r/g PULM: prolonged exp phase, lungs bilaterally clear  BJ:YNWGGI:soft, non-tender, bsx4 active  Extremities: warm/dry, no edema  Skin: no rashes or lesions  Recent Labs  Lab 08/01/17 0330 08/02/17 0338 08/03/17 0354  NA 142 143 144  K 4.4 4.2 4.7  CL 90* 89* 94*  CO2 45* 46* 45*  BUN 30* 31* 29*  CREATININE 0.59 0.52 0.50  GLUCOSE 153* 143* 127*    Recent Labs  Lab 08/01/17 0330 08/02/17 0338 08/03/17 0354  HGB 13.4 13.6 13.4  HCT 44.9 44.2 42.9  WBC 11.5* 13.7* 12.0*  PLT 246 225 211    [place] Abdomen Limited  Result Date: 08/02/2017 CLINICAL DATA:  Liver mass on CT EXAM: ULTRASOUND ABDOMEN LIMITED RIGHT UPPER QUADRANT COMPARISON:  Chest CT 07/31/2017 FINDINGS: Gallbladder: No gallstones or wall thickening visualized. No sonographic Murphy sign noted by sonographer.  Common bile duct: Diameter: Normal caliber, 3 mm Liver: Small cysts in the left hepatic lobe corresponding to the abnormalities seen on chest CT. These measure 1.3 cm and 0.9 cm maximally and appeared represent benign cysts. Portal vein is patent on color Doppler imaging with normal direction of blood flow towards the liver. IMPRESSION: Small cystic areas within the left hepatic lobe as above, most compatible with benign cysts. These could be followed with repeat ultrasound in 6-12 months to ensure stability. Electronically Signed   By: Charlett NoseKevin  Dover M.D.   On: 08/02/2017 09:08   Dg Chest Port 1 View  Result Date: 08/03/2017 CLINICAL DATA:  Respiratory failure. EXAM: PORTABLE CHEST 1 VIEW COMPARISON:  08/01/2017 FINDINGS: The cardiac silhouette, mediastinal and hilar contours are within normal limits and stable. There is tortuosity and calcification of the thoracic aorta. Low the lungs demonstrate stable emphysematous changes but no definite acute overlying pulmonary process. Mild pulmonary artery enlargement is stable. The bony thorax is intact. IMPRESSION: Stable emphysematous changes but no definite acute overlying pulmonary process. Electronically Signed   By: Rudie MeyerP.  Gallerani M.D.   On: 08/03/2017 07:45     SIGNIFICANT EVENTS  11/19  Admit with SOB, suspected COPD exacerbation  STUDIES:  ECHO 11/20 >> LVEF 65-70%, no RWMA, grade 1 diastolic dysfunction CTA 11/26 >> no evidence of PE, advanced panlobular emphysema    ASSESSMENT / PLAN:  COPD exacerbation Acute resp failure Likely viral pneumonitis contribution Pulm edema contribution, some Severe centrilobular emphysema Tobacco abuse, ongoing Acute on chronic respiratory failure with hypercapnea Acute diastolic heart failure, agree  Discussion:  71 y/o F, current smoker, with advanced emphysema admitted with dyspnea.  She has been treated for infectious process, COPD exacerbation and PE has been ruled out.  Despite aggressive supportive  measures, she has been unable to remain off BiPAP for more than one Waller.  I am concerned given the duration of her hospitalization that she is not responding to therapy.  DNR/DNI in the event of arrest at patients request.    Plan: Intermittent BiPAP for WOB Negative balance as BP / renal function permit  O2 as needed to support sats 88-94% (NO higher, pt retains) Continue Brovana, Pulmicort, Duoneb  Solumedrol 40 mg IV Q12 Follow intermittent CXR  Hold ARB Mobilize  DNR/DNI in the event of arrest PT & Nutrition efforts   Canary BrimBrandi Ollis, NP-C [place] Pgr: [phone] or if no answer [phone] 08/03/2017, 10:53 AM  STAFF NOTE: I, Rory Percyaniel Feinstein, MD FACP have personally reviewed patient's available data, including medical history, events of note, physical examination and test results as part of my evaluation. I have discussed with resident/NP and other care providers such as pharmacist, RN and RRT. In addition, I personally evaluated patient and elicited key findings of: awake, alert, less distress, less purse lip to none, off BIPAP daytime, coarse BS , 3 liters needed, less edema, was neg 300 cc last 24 hours, pcxr which I reviewed shows chronic int changes, no acute, maybe slight angle blunting, lasix to even to slight neg okay, bmet tolerated, but may hit max soon of lasix tolerance, steroids slight down and then tapper off over 10 days, Bders to remain, less wheezing, maintain brovana, lower O2 as needed, will need HOME O2, consider PT consult, she is clear dnr dni  Mcarthur Rossettianiel J. Tyson AliasFeinstein, MD, FACP Pgr: [phone] [place] [facility] 08/03/2017 11:20 AM

## 2017-08-03 NOTE — Progress Notes (Signed)
Physical Therapy Treatment Patient Details Name: Katie DingwallVelma L Waller MRN: [id number] DOB: Jan 02, 1946 Today's Date: 08/03/2017    History of Present Illness 71 year old female with history of COPD, asthma, diabetes mellitus, hypertension, active tobacco use presented with worsening dyspnea.  She was admitted with COPD exacerbation    PT Comments    Pt's bed was saturated in urine at start of tx. Mod assist for bed mobility, min assist to stand, pt stood with RW for 1 minute for pericare. Min assist to pivot to recliner. Ambulation not attempted due to HR 127 and 3/4 dyspnea with transfer. SaO2 88% on 2L O2 with activity. Pt now recommending ST-SNF.    Follow Up Recommendations  SNF(dependent on acute stay progress)     Equipment Recommendations  Wheelchair (measurements PT);Wheelchair cushion (measurements PT);Rolling walker with 5" wheels;3in1 (PT)(TBD  dependent on acute stay progress)    Recommendations for Other Services OT consult     Precautions / Restrictions Precautions Precautions: Fall Precaution Comments: O2 dependent Restrictions Weight Bearing Restrictions: No    Mobility  Bed Mobility Overal bed mobility: Needs Assistance Bed Mobility: Supine to Sit     Supine to sit: HOB elevated;Mod assist     General bed mobility comments: assist to raise trunk  Transfers Overall transfer level: Needs assistance Equipment used: Rolling walker (2 wheeled) Transfers: Sit to/from Stand Sit to Stand: Min assist;+2 safety/equipment         General transfer comment: VCs hand placement, pt stood with RW for 1 minute for pericare then pivoted to recliner with RW; SaO2 88% on 2L O2 with activity, HR 127 max, 3/4 dyspnea  Ambulation/Gait             General Gait Details: deferred 2* elevated HR and dyspnea with transfer   Stairs            Wheelchair Mobility    Modified Rankin (Stroke Patients Only)       Balance Overall balance assessment: Needs  assistance Sitting-balance support: No upper extremity supported;Feet supported Sitting balance-Leahy Scale: Good     Standing balance support: Bilateral upper extremity supported Standing balance-Leahy Scale: Poor                              Cognition Arousal/Alertness: Lethargic Behavior During Therapy: WFL for tasks assessed/performed;Flat affect Overall Cognitive Status: Within Functional Limits for tasks assessed                                        Exercises      General Comments        Pertinent Vitals/Pain Pain Assessment: No/denies pain    Home Living                      Prior Function            PT Goals (current goals can now be found in the care plan section) Acute Rehab PT Goals Patient Stated Goal: to get stronger PT Goal Formulation: With patient/family Time For Goal Achievement: 08/12/17 Potential to Achieve Goals: Fair Progress towards PT goals: Not progressing toward goals - comment(dyspnea/fatigue limiting activity tolerance)    Frequency    Min 3X/week      PT Plan Discharge plan needs to be updated    Co-evaluation  AM-PAC PT "6 Clicks" Daily Activity  Outcome Measure  Difficulty turning over in bed (including adjusting bedclothes, sheets and blankets)?: A Lot Difficulty moving from lying on back to sitting on the side of the bed? : Unable Difficulty sitting down on and standing up from a chair with arms (e.g., wheelchair, bedside commode, etc,.)?: Unable Help needed moving to and from a bed to chair (including a wheelchair)?: A Little Help needed walking in hospital room?: Total Help needed climbing 3-5 steps with a railing? : Total 6 Click Score: 9    End of Session Equipment Utilized During Treatment: Gait belt;Oxygen Activity Tolerance: Patient limited by fatigue Patient left: with call bell/phone within reach;with family/visitor present;in chair;with chair alarm  set Nurse Communication: Mobility status PT Visit Diagnosis: Unsteadiness on feet (R26.81);Muscle weakness (generalized) (M62.81);Difficulty in walking, not elsewhere classified (R26.2)     Time: 1610-96041405-1427 PT Time Calculation (min) (ACUTE ONLY): 22 min  Charges:  $Therapeutic Activity: 8-22 mins                    G Codes:          Tamala SerUhlenberg, Draydon Clairmont Kistler 08/03/2017, 2:40 PM [phone]

## 2017-08-03 NOTE — Progress Notes (Signed)
PT Cancellation Note  Patient Details Name: Katie Waller MRN: [id number] DOB: [birth year]   Cancelled Treatment:    Reason Eval/Treat Not Completed: Fatigue/lethargy limiting ability to participate(pt wants to nap right now, requested PT attempt later. Will follow. )   Tamala SerUhlenberg, Treasure Ochs Kistler 08/03/2017, 11:56 AM [phone]

## 2017-08-03 NOTE — Progress Notes (Signed)
OT Cancellation Note  Patient Details Name: Katie Waller MRN: [id number] DOB: May 19, 1946   Cancelled Treatment:    Reason Eval/Treat Not Completed: Other (comment).  Noted pt worked with PT about 40 minutes ago and only tolerated SPT.  Will try to return tomorrow if schedule permits.  Eain Mullendore 08/03/2017, 3:07 PM  Marica OtterMaryellen Markeem Noreen, OTR/L [phone] 08/03/2017

## 2017-08-03 NOTE — Progress Notes (Signed)
OT Cancellation Note  Patient Details Name: Katie Waller MRN: [id number] DOB: 1946-05-28   Cancelled Treatment:    Reason Eval/Treat Not Completed: Other (comment).  PT just checked on pt and she wants to sleep at this time. If schedule permits, I will check back later.  Gregor Dershem 08/03/2017, 11:34 AM  Katie Waller, OTR/L [phone] 08/03/2017

## 2017-08-03 NOTE — Progress Notes (Signed)
[facility]  PROGRESS NOTE  Katie Waller ONG:[id number] DOB: 14-Aug-1946 DOA: 07/24/2017 PCP: Elias Elseeade, Robert, MD   Brief HPI:   71 year old female with history of COPD not adherent to prescribed home oxygen, asthma, diabetes mellitus, hypertension, active tobacco use presented with worsening dyspnea over the past month. CXR showed hyperinflation and diaphragm flattening with no infiltrate. She was admitted with COPD exacerbation. Echo showed EF 65-70%, G1DD with normal RV and PA caliber/pressures. Work of breathing remained elevated as of 11/21 despite IV steroids, azithromycin, and nebulizer treatments, prompting use of BiPAP and transfer to SDU. Pulmonology consulted. She has continued to require NIPPV intermittently.      Subjective   Patient seen and examined, off BiPAP this morning.  Breathing is improved.   Assessment/Plan:     1. Acute on chronic hypoxic/hypercapnic respiratory failure-secondary to COPD noncompliant with home oxygen, tobacco abuse.  Was placed on BiPAP which has been currently taken off.  CT was negative for PE, chest x-ray was negative for infiltrate.  CCM consulted patient currently on IV steroids, nebulizer treatment. 2. Acute on chronic diastolic CHF-patient was started on IV Lasix, the dose of Lasix has been changed to 40 mg p.o daily. by P CCM.  Follow BMP in a.m. 3. Essential hypertension-continue Lasix, losartan is on hold. 4. Tobacco use-counseled to quit, continue nicotine patch 5. Nonspecific CT finding of liver density-ultrasound of the liver showed small cystic areas within the left hepatic lobe most compatible with a benign cyst.  Repeat ultrasound in 6-12 months to ensure stability.    DVT prophylaxis: Lovenox  Code Status: DNR  Family Communication: Discussed with patient's daughter at bedside  Disposition Plan: Pending improvement in patient's respiratory status    Consultants:  Patient  centimeters  Procedures:  None  Continuous infusions . sodium chloride        Antibiotics:   Anti-infectives (From admission, onward)   Start     Dose/Rate Route Frequency Ordered Stop   07/24/17 1730  azithromycin (ZITHROMAX) tablet 500 mg     500 mg Oral Daily 07/24/17 1647 07/28/17 1051       Objective   Vitals:   08/03/17 0600 08/03/17 0800 08/03/17 0846 08/03/17 1200  BP: (!) 166/78     Pulse: 96     Resp: (!) 23     Temp:  98.8 F (37.1 C)  98.5 F (36.9 C)  TempSrc:  Oral  Oral  SpO2: 100%  96%   Weight:      Height:        Intake/Output Summary (Last 24 hours) at 08/03/2017 1325 Last data filed at 08/03/2017 1040 Gross per 24 hour  Intake 3 ml  Output 300 ml  Net -297 ml   Filed Weights   08/01/17 0600 08/02/17 0500 08/03/17 0500  Weight: 62.7 kg (138 lb 3.7 oz) 63.5 kg (139 lb 15.9 oz) 61 kg (134 lb 7.7 oz)     Physical Examination:   Physical Exam: Eyes: No icterus, extraocular muscles intact  Mouth: Oral mucosa is moist, no lesions on palate,  Neck: Supple, no deformities, masses, or tenderness Lungs: Normal respiratory effort, decreased breath sounds bilaterally  Heart: Regular rate and rhythm, S1 and S2 normal, no murmurs, rubs auscultated Abdomen: BS normoactive,soft,nondistended,non-tender to palpation,no organomegaly Extremities: No pretibial edema, no erythema, no cyanosis, no clubbing Neuro : Alert and oriented to time, place and person, No focal deficits Skin: No rashes seen on exam   Data Reviewed: I have personally reviewed following  labs and imaging studies  CBG: No results for input(s): GLUCAP in the last 168 hours.  CBC: Recent Labs  Lab 07/29/17 0252 07/30/17 0313 08/01/17 0330 08/02/17 0338 08/03/17 0354  WBC 8.7 9.3 11.5* 13.7* 12.0*  NEUTROABS  --  8.4*  --   --   --   HGB 13.8 13.6 13.4 13.6 13.4  HCT 43.7 44.4 44.9 44.2 42.9  MCV 94.2 95.5 98.7 96.1 94.5  PLT 309 275 246 225 211    Basic Metabolic  Panel: Recent Labs  Lab 07/31/17 0559 07/31/17 1757 08/01/17 0330 08/02/17 0338 08/03/17 0354  NA 138 142 142 143 144  K 5.6* 4.1 4.4 4.2 4.7  CL 90* 90* 90* 89* 94*  CO2 40* 44* 45* 46* 45*  GLUCOSE 130* 197* 153* 143* 127*  BUN 29* 28* 30* 31* 29*  CREATININE 0.73 0.64 0.59 0.52 0.50  CALCIUM 8.7* 8.5* 8.7* 8.9 8.8*  MG  --   --  2.5* 2.4 2.4  PHOS  --   --   --   --  3.5    Recent Results (from the past 240 hour(s))  MRSA PCR Screening     Status: None   Collection Time: 07/26/17  9:19 PM  Result Value Ref Range Status   MRSA by PCR NEGATIVE NEGATIVE Final    Comment:        The GeneXpert MRSA Assay (FDA approved for NASAL specimens only), is one component of a comprehensive MRSA colonization surveillance program. It is not intended to diagnose MRSA infection nor to guide or monitor treatment for MRSA infections.      Liver Function Tests: No results for input(s): AST, ALT, ALKPHOS, BILITOT, PROT, ALBUMIN in the last 168 hours. No results for input(s): LIPASE, AMYLASE in the last 168 hours. No results for input(s): AMMONIA in the last 168 hours.  Cardiac Enzymes: No results for input(s): CKTOTAL, CKMB, CKMBINDEX, TROPONINI in the last 168 hours. BNP (last 3 results) Recent Labs    07/24/17 1100  BNP 84.9    ProBNP (last 3 results) No results for input(s): PROBNP in the last 8760 hours.    Studies: US Abdomen Limited  Result Date: 08/02/2017 CLINICAL DATA:  Liver mass on CT EXAM: ULTRASOUND ABDOMEN LIMITED RIGHT UPPER QUADRANT COMPARISON:  Chest CT 07/31/2017 FINDINGS: Gallbladder: No gallstones or wall thickening visualized. No sonographic Murphy sign noted by sonographer. Common bile duct: Diameter: Normal caliber, 3 mm Liver: Small cysts in the left hepatic lobe corresponding to the abnormalities seen on chest CT. These measure 1.3 cm and 0.9 cm maximally and appeared represent benign cysts. Portal vein is patent on color Doppler imaging with normal  direction of blood flow towards the liver. IMPRESSION: Small cystic areas within the left hepatic lobe as above, most compatible with benign cysts. These could be followed with repeat ultrasound in 6-12 months to ensure stability. Electronically Signed   By: Charlett Nose M.D.   On: 08/02/2017 09:08   Dg Chest Port 1 View  Result Date: 08/03/2017 CLINICAL DATA:  Respiratory failure. EXAM: PORTABLE CHEST 1 VIEW COMPARISON:  08/01/2017 FINDINGS: The cardiac silhouette, mediastinal and hilar contours are within normal limits and stable. There is tortuosity and calcification of the thoracic aorta. Low the lungs demonstrate stable emphysematous changes but no definite acute overlying pulmonary process. Mild pulmonary artery enlargement is stable. The bony thorax is intact. IMPRESSION: Stable emphysematous changes but no definite acute overlying pulmonary process. Electronically Signed   By: Rudie Meyer  M.D.   On: 08/03/2017 07:45    Scheduled Meds: . arformoterol  15 mcg Nebulization BID  . aspirin  81 mg Oral Daily  . bisacodyl  10 mg Rectal Daily  . budesonide (PULMICORT) nebulizer solution  0.5 mg Nebulization BID  . docusate sodium  200 mg Oral BID  . enoxaparin (LOVENOX) injection  40 mg Subcutaneous Q24H  . furosemide  40 mg Oral Daily  . ipratropium-albuterol  3 mL Nebulization Q6H  . [START ON 08/04/2017] methylPREDNISolone (SOLU-MEDROL) injection  40 mg Intravenous Daily  . nicotine  21 mg Transdermal Daily  . polyethylene glycol  17 g Oral BID  . raloxifene  60 mg Oral Daily  . sodium chloride flush  3 mL Intravenous Q12H      Time spent: 25 min  Meredeth IdeGagan S Lovena Kluck   [facility] Pager [phone]. If 7PM-7AM, please contact night-coverage at [email or web address], Office  [phone]  password [email or web address]  08/03/2017, 1:25 PM  LOS: 10 days

## 2017-08-03 NOTE — Progress Notes (Signed)
Hospital follow up arranged with Pulmonary.  PCCM will be available PRN.  Please call back if new needs arise.    Canary BrimBrandi Ollis, NP-C [place] [facility] Pgr: [phone] or if no answer [phone] 08/03/2017, 12:02 PM

## 2017-08-04 LAB — BASIC METABOLIC PANEL
ANION GAP: 8 (ref 5–15)
BUN: 25 mg/dL — ABNORMAL HIGH (ref 6–20)
CALCIUM: 8.6 mg/dL — AB (ref 8.9–10.3)
CO2: 42 mmol/L — ABNORMAL HIGH (ref 22–32)
Chloride: 92 mmol/L — ABNORMAL LOW (ref 101–111)
Creatinine, Ser: 0.53 mg/dL (ref 0.44–1.00)
Glucose, Bld: 95 mg/dL (ref 65–99)
Potassium: 4.1 mmol/L (ref 3.5–5.1)
Sodium: 142 mmol/L (ref 135–145)

## 2017-08-04 LAB — CBC
HCT: 43.4 % (ref 36.0–46.0)
Hemoglobin: 13.4 g/dL (ref 12.0–15.0)
MCH: 29.5 pg (ref 26.0–34.0)
MCHC: 30.9 g/dL (ref 30.0–36.0)
MCV: 95.4 fL (ref 78.0–100.0)
PLATELETS: 226 10*3/uL (ref 150–400)
RBC: 4.55 MIL/uL (ref 3.87–5.11)
RDW: 14.2 % (ref 11.5–15.5)
WBC: 11.1 10*3/uL — AB (ref 4.0–10.5)

## 2017-08-04 LAB — MAGNESIUM: Magnesium: 2.3 mg/dL (ref 1.7–2.4)

## 2017-08-04 NOTE — Progress Notes (Signed)
OT Treatment Note  Pt making slow progress toward goals. Max HR 130 initially upon sitting with O2 in 90s on RA while sitting. Drop in systolic pressure initially upon standing but pt not symptomatic. Dypsnea 3/4 during mobility ( 4 steps to chair). [facility]  Encourage nursing to mobilize pt OOB to chair at least BID. Encourage pt ot use BSC rather than purewick to increase mobility if possible.      08/04/17 1600  OT Visit Information  Last OT Received On 08/04/17  Assistance Needed +2  PT/OT/SLP Co-Evaluation/Treatment Yes  Reason for Co-Treatment To address functional/ADL transfers;For patient/therapist safety  OT goals addressed during session ADL's and self-care  History of Present Illness 71 year old female with history of COPD, asthma, diabetes mellitus, hypertension, active tobacco use presented with worsening dyspnea.  She was admitted with COPD exacerbation  Precautions  Precautions Fall  Precaution Comments monitor O2 & HR  Cognition  Arousal/Alertness Awake/alert  Behavior During Therapy Flat affect  Overall Cognitive Status Within Functional Limits for tasks assessed  ADL  Overall ADL's  Needs assistance/impaired  Grooming Minimal assistance;Sitting  Upper Body Bathing Moderate assistance;Sitting  Lower Body Bathing Moderate assistance;Sit to/from stand  Upper Body Dressing  Moderate assistance  Lower Body Dressing Moderate assistance;Sit to/from Scientist, research (life sciences)stand  Toilet Transfer +2 for physical assistance;Minimal assistance  Toilet Transfer Details (indicate cue type and reason) simulated  Toileting - Clothing Manipulation Details (indicate cue type and reason) encouraged pt to use BSC  Functional mobility during ADLs Minimal assistance;+2 for physical assistance;Rolling walker;Cueing for safety;Cueing for sequencing  General ADL Comments easily fatigues with minimal activity  Bed Mobility  Overal bed mobility Needs Assistance  General bed mobility  comments moved to EOB wtih PT assisting  Balance  Overall balance assessment Needs assistance  Sitting balance-Leahy Scale Good  Standing balance-Leahy Scale Poor  Transfers  Overall transfer level Needs assistance  Equipment used Rolling walker (2 wheeled)  Transfers Sit to/from Stand  Sit to Stand Min assist;+2 safety/equipment (no physical A from 2nd person)  OT - End of Session  Equipment Utilized During Treatment Gait belt;Rolling walker;Oxygen  Activity Tolerance Patient limited by fatigue  Patient left in chair;with call bell/phone within reach;with chair alarm set;with family/visitor present  Nurse Communication Mobility status  OT Assessment/Plan  OT Plan Discharge plan remains appropriate  OT Visit Diagnosis Unsteadiness on feet (R26.81);Muscle weakness (generalized) (M62.81)  OT Frequency (ACUTE ONLY) Min 2X/week  Follow Up Recommendations Supervision/Assistance - 24 hour  OT Equipment None recommended by OT  AM-PAC OT "6 Clicks" Daily Activity Outcome Measure  Help from another person eating meals? 4  Help from another person taking care of personal grooming? 3  Help from another person toileting, which includes using toliet, bedpan, or urinal? 2  Help from another person bathing (including washing, rinsing, drying)? 2  Help from another person to put on and taking off regular upper body clothing? 2  Help from another person to put on and taking off regular lower body clothing? 2  6 Click Score 15  ADL G Code Conversion CK  OT Goal Progression  Progress towards OT goals Not progressing toward goals - comment (medicaly declien)  Acute Rehab OT Goals  Patient Stated Goal to get stronger  OT Goal Formulation With patient  Time For Goal Achievement 08/05/17  Potential to Achieve Goals Good  ADL Goals  Pt Will Transfer to Toilet with modified independence;ambulating;regular height toilet  Additional ADL Goal #1 pt will verbalize  3 energy conservation strategies  OT  Time Calculation  OT Start Time (ACUTE ONLY) 1148  OT Stop Time (ACUTE ONLY) 1212  OT Time Calculation (min) 24 min  OT General Charges  $OT Visit 1 Visit  OT Treatments  $Self Care/Home Management  8-22 mins  [facility] Kino [place], OT/L  [phone] 08/04/2017

## 2017-08-04 NOTE — Progress Notes (Signed)
Physical Therapy Treatment Patient Details Name: Katie Waller MRN: [id number] DOB: Mar 02, 1946 Today's Date: 08/04/2017    History of Present Illness 71 year old female with history of COPD, asthma, diabetes mellitus, hypertension, active tobacco use presented with worsening dyspnea.  She was admitted with COPD exacerbation    PT Comments    Assisted pt OOB required +2 ASSIST AND CLOSE MONITORING OF VITALS SUPINE: HR 104, BP133/57, 2lts 97% SITTING: HR 107, BP 118/63, 2lts 100% STANDING: HR 130, BP 101/55, RA 89 Pt was only able to amb 4 feet MAX fatigue.  Significant decliner from prior Tx 11/24 pt amb 110 feet.   Follow Up Recommendations  SNF     Equipment Recommendations       Recommendations for Other Services       Precautions / Restrictions Precautions Precautions: Fall Precaution Comments: monitor O2 Restrictions Weight Bearing Restrictions: No    Mobility  Bed Mobility Overal bed mobility: Needs Assistance Bed Mobility: Supine to Sit     Supine to sit: HOB elevated;Mod assist;+2 for safety/equipment     General bed mobility comments: assist to raise trunk  Transfers Overall transfer level: Needs assistance Equipment used: Rolling walker (2 wheeled) Transfers: Sit to/from Stand Sit to Stand: Min assist;+2 safety/equipment         General transfer comment: + 2 assist for safety and multiple lines/leads.  50% VC's on proper hand placement   Ambulation/Gait Ambulation/Gait assistance: Min assist;+2 physical assistance;+2 safety/equipment Ambulation Distance (Feet): 4 Feet Assistive device: Rolling walker (2 wheeled) Gait Pattern/deviations: Step-through pattern;Decreased step length - right;Decreased step length - left;Shuffle;Trunk flexed Gait velocity: decr   General Gait Details: limited distance/tolerance    Stairs            Wheelchair Mobility    Modified Rankin (Stroke Patients Only)       Balance                                             Cognition Arousal/Alertness: Awake/alert Behavior During Therapy: WFL for tasks assessed/performed;Flat affect Overall Cognitive Status: Within Functional Limits for tasks assessed                                 General Comments: sleepy/tired      Exercises      General Comments        Pertinent Vitals/Pain Pain Assessment: No/denies pain    Home Living                      Prior Function            PT Goals (current goals can now be found in the care plan section) Progress towards PT goals: Progressing toward goals    Frequency    Min 3X/week      PT Plan Discharge plan needs to be updated    Co-evaluation              AM-PAC PT "6 Clicks" Daily Activity  Outcome Measure  Difficulty turning over in bed (including adjusting bedclothes, sheets and blankets)?: A Lot Difficulty moving from lying on back to sitting on the side of the bed? : Unable Difficulty sitting down on and standing up from a chair with arms (e.g., wheelchair, bedside commode, etc,.)?: Unable Help needed moving  to and from a bed to chair (including a wheelchair)?: A Little Help needed walking in hospital room?: Total Help needed climbing 3-5 steps with a railing? : Total 6 Click Score: 9    End of Session Equipment Utilized During Treatment: Gait belt;Oxygen Activity Tolerance: Patient limited by fatigue Patient left: with call bell/phone within reach;with family/visitor present;in chair;with chair alarm set Nurse Communication: Mobility status PT Visit Diagnosis: Unsteadiness on feet (R26.81);Muscle weakness (generalized) (M62.81);Difficulty in walking, not elsewhere classified (R26.2)     Time: 1132-1207 PT Time Calculation (min) (ACUTE ONLY): 35 min  Charges:  $Gait Training: 8-22 mins $Therapeutic Activity: 8-22 mins                    G Codes:       Felecia ShellingLori Waymond Meador  PTA [facility]  Acute  Rehab Pager       [phone]

## 2017-08-04 NOTE — Progress Notes (Addendum)
[facility]  PROGRESS NOTE  Katie DingwallVelma L Desrocher ZOX:[id number] DOB: Dec 17, 1945 DOA: 07/24/2017 PCP: Elias Elseeade, Robert, MD   Brief HPI:   71 year old female with history of COPD not adherent to prescribed home oxygen, asthma, diabetes mellitus, hypertension, active tobacco use presented with worsening dyspnea over the past month. CXR showed hyperinflation and diaphragm flattening with no infiltrate. She was admitted with COPD exacerbation. Echo showed EF 65-70%, G1DD with normal RV and PA caliber/pressures. Work of breathing remained elevated as of 11/21 despite IV steroids, azithromycin, and nebulizer treatments, prompting use of BiPAP and transfer to SDU. Pulmonology consulted. She has continued to require NIPPV intermittently.    Subjective   Patient seen and examined, off BiPAP this morning.   Assessment/Plan:     1. Acute on chronic hypoxic/hypercapnic respiratory failure-secondary to COPD noncompliant with home oxygen, tobacco abuse.  Was placed on BiPAP which has been currently taken off.  CT was negative for PE, chest x-ray was negative for infiltrate.  CCM was consulted patient  on IV steroids, nebulizer treatment. 2. Acute on chronic diastolic CHF-patient was started on IV Lasix, the dose of Lasix has been changed to 40 mg p.o daily. by P CCM.   3. Essential hypertension-continue Lasix, losartan is on hold. 4. Tobacco use-counseled to quit, continue nicotine patch 5. Nonspecific CT finding of liver density-ultrasound of the liver showed small cystic areas within the left hepatic lobe most compatible with a benign cyst.  Repeat ultrasound in 6-12 months to ensure stability.    DVT prophylaxis: Lovenox  Code Status: DNR  Family Communication: Discussed with patient's daughter at bedside  Disposition Plan: SNF  Consultants:  Patient centimeters  Procedures:  None  Continuous infusions . sodium chloride        Antibiotics:   Anti-infectives (From admission,  onward)   Start     Dose/Rate Route Frequency Ordered Stop   07/24/17 1730  azithromycin (ZITHROMAX) tablet 500 mg     500 mg Oral Daily 07/24/17 1647 07/28/17 1051       Objective   Vitals:   08/04/17 0600 08/04/17 0800 08/04/17 0909 08/04/17 1200  BP:      Pulse: (!) 107     Resp: (!) 26     Temp:  99.4 F (37.4 C)  99.1 F (37.3 C)  TempSrc:  Oral  Oral  SpO2: 97%  98%   Weight:      Height:        Intake/Output Summary (Last 24 hours) at 08/04/2017 1229 Last data filed at 08/04/2017 1009 Gross per 24 hour  Intake 3 ml  Output 1275 ml  Net -1272 ml   Filed Weights   08/02/17 0500 08/03/17 0500 08/04/17 0500  Weight: 63.5 kg (139 lb 15.9 oz) 61 kg (134 lb 7.7 oz) 62 kg (136 lb 11 oz)     Physical Examination:  Physical Exam: Eyes: No icterus, extraocular muscles intact  Mouth: Oral mucosa is moist, no lesions on palate,  Neck: Supple, no deformities, masses, or tenderness Lungs: Normal respiratory effort, decreased breath sounds bilaterally Heart: Regular rate and rhythm, S1 and S2 normal, no murmurs, rubs auscultated Abdomen: BS normoactive,soft,nondistended,non-tender to palpation,no organomegaly Extremities: No pretibial edema, no erythema, no cyanosis, no clubbing Neuro : Alert and oriented to time, place and person, No focal deficits Skin: No rashes seen on exam   Data Reviewed: I have personally reviewed following labs and imaging studies  CBG: No results for input(s): GLUCAP in the last 168 hours.  CBC: Recent Labs  Lab 07/30/17 0313 08/01/17 0330 08/02/17 0338 08/03/17 0354 08/04/17 0331  WBC 9.3 11.5* 13.7* 12.0* 11.1*  NEUTROABS 8.4*  --   --   --   --   HGB 13.6 13.4 13.6 13.4 13.4  HCT 44.4 44.9 44.2 42.9 43.4  MCV 95.5 98.7 96.1 94.5 95.4  PLT 275 246 225 211 226    Basic Metabolic Panel: Recent Labs  Lab 07/31/17 1757 08/01/17 0330 08/02/17 0338 08/03/17 0354 08/04/17 0331  NA 142 142 143 144 142  K 4.1 4.4 4.2 4.7 4.1   CL 90* 90* 89* 94* 92*  CO2 44* 45* 46* 45* 42*  GLUCOSE 197* 153* 143* 127* 95  BUN 28* 30* 31* 29* 25*  CREATININE 0.64 0.59 0.52 0.50 0.53  CALCIUM 8.5* 8.7* 8.9 8.8* 8.6*  MG  --  2.5* 2.4 2.4 2.3  PHOS  --   --   --  3.5  --     Recent Results (from the past 240 hour(s))  MRSA PCR Screening     Status: None   Collection Time: 07/26/17  9:19 PM  Result Value Ref Range Status   MRSA by PCR NEGATIVE NEGATIVE Final    Comment:        The GeneXpert MRSA Assay (FDA approved for NASAL specimens only), is one component of a comprehensive MRSA colonization surveillance program. It is not intended to diagnose MRSA infection nor to guide or monitor treatment for MRSA infections.      Liver Function Tests: No results for input(s): AST, ALT, ALKPHOS, BILITOT, PROT, ALBUMIN in the last 168 hours. No results for input(s): LIPASE, AMYLASE in the last 168 hours. No results for input(s): AMMONIA in the last 168 hours.  Cardiac Enzymes: No results for input(s): CKTOTAL, CKMB, CKMBINDEX, TROPONINI in the last 168 hours. BNP (last 3 results) Recent Labs    07/24/17 1100  BNP 84.9    ProBNP (last 3 results) No results for input(s): PROBNP in the last 8760 hours.    Studies: Dg Chest Port 1 View  Result Date: 08/03/2017 CLINICAL DATA:  Respiratory failure. EXAM: PORTABLE CHEST 1 VIEW COMPARISON:  08/01/2017 FINDINGS: The cardiac silhouette, mediastinal and hilar contours are within normal limits and stable. There is tortuosity and calcification of the thoracic aorta. Low the lungs demonstrate stable emphysematous changes but no definite acute overlying pulmonary process. Mild pulmonary artery enlargement is stable. The bony thorax is intact. IMPRESSION: Stable emphysematous changes but no definite acute overlying pulmonary process. Electronically Signed   By: Rudie MeyerP.  Gallerani M.D.   On: 08/03/2017 07:45    Scheduled Meds: . arformoterol  15 mcg Nebulization BID  . aspirin  81 mg  Oral Daily  . bisacodyl  10 mg Rectal Daily  . budesonide (PULMICORT) nebulizer solution  0.5 mg Nebulization BID  . docusate sodium  200 mg Oral BID  . enoxaparin (LOVENOX) injection  40 mg Subcutaneous Q24H  . furosemide  40 mg Oral Daily  . ipratropium-albuterol  3 mL Nebulization Q6H  . methylPREDNISolone (SOLU-MEDROL) injection  40 mg Intravenous Daily  . nicotine  21 mg Transdermal Daily  . polyethylene glycol  17 g Oral BID  . raloxifene  60 mg Oral Daily  . sodium chloride flush  3 mL Intravenous Q12H      Time spent: 25 min  Meredeth IdeGagan S Elek Holderness   [facility] Pager [phone]. If 7PM-7AM, please contact night-coverage at [email or web address], Office  [phone]  password [email or web address]  08/04/2017,  12:29 PM  LOS: 11 days

## 2017-08-04 NOTE — NC FL2 (Signed)
[place] MEDICAID FL2 LEVEL OF CARE SCREENING TOOL     IDENTIFICATION  Patient Name: Katie Waller Birthdate: Jul 10, 1946 Sex: female Admission Date (Current Location): 07/24/2017  [place] [facility] [place]   Facility and Address:  [facility],  [address], [place] [id number]      Provider Number: [id number]  Attending Physician Name and Address:  Meredeth IdeLama, Gagan S, MD  Relative Name and Phone Number:       Current Level of Care: SNF Recommended Level of Care: Skilled Nursing Facility Prior Approval Number:    Date Approved/Denied:   PASRR Number:   [id number]  Discharge Plan: SNF    Current Diagnoses: Patient Active Problem List   Diagnosis Date Noted  . Acute on chronic respiratory failure with hypercapnia (HCC)   . Palliative care encounter   . Goals of care, counseling/discussion   . Chronic diastolic CHF (congestive heart failure) (HCC) 07/28/2017  . Venous stasis of both lower extremities 06/13/2016  . Acute respiratory failure (HCC) 06/08/2016  . Abnormal finding on EKG 09/19/2013  . Prediabetes 09/19/2013  . Sinus tachycardia 09/18/2013  . Essential hypertension 09/18/2013  . Near syncope 09/17/2013  . COPD exacerbation (HCC) 09/17/2013  . Smoker 09/17/2013    Orientation RESPIRATION BLADDER Height & Weight     Self, Time, Situation, Place  O2(3 Liters) Continent Weight: 136 lb 11 oz (62 kg) Height:  5\' 1"  (154.9 cm)  BEHAVIORAL SYMPTOMS/MOOD NEUROLOGICAL BOWEL NUTRITION STATUS        Diet(Diet Soft)  AMBULATORY STATUS COMMUNICATION OF NEEDS Skin     Verbally Normal                       Personal Care Assistance Level of Assistance  Bathing, Feeding, Dressing Bathing Assistance: Maximum assistance Feeding assistance: Independent Dressing Assistance: Maximum assistance     Functional Limitations Info             SPECIAL CARE FACTORS FREQUENCY  PT (By licensed PT), OT (By licensed OT)     PT  Frequency: 5 OT Frequency: 5            Contractures      Additional Factors Info  Code Status, Allergies Code Status Info: DNR Allergies Info: ACE INHIBITORS, CODEINE           Current Medications (08/04/2017):  This is the current hospital active medication list Current Facility-Administered Medications  Medication Dose Route Frequency Provider Last Rate Last Dose  . 0.9 %  sodium chloride infusion  250 mL Intravenous PRN Arrien, York RamMauricio Daniel, MD      . acetaminophen (TYLENOL) tablet 650 mg  650 mg Oral Q6H PRN Arrien, York RamMauricio Daniel, MD   650 mg at 08/01/17 0900   Or  . acetaminophen (TYLENOL) suppository 650 mg  650 mg Rectal Q6H PRN Arrien, York RamMauricio Daniel, MD      . arformoterol [facility](BROVANA) nebulizer solution 15 mcg  15 mcg Nebulization BID Canary Brimllis, Brandi L, NP   15 mcg at 08/04/17 0913  . aspirin chewable tablet 81 mg  81 mg Oral Daily Arrien, York RamMauricio Daniel, MD   81 mg at 08/04/17 1008  . benzonatate (TESSALON) capsule 200 mg  200 mg Oral TID PRN Coralie KeensArrien, Mauricio Daniel, MD   200 mg at 08/04/17 0617  . bisacodyl (DULCOLAX) suppository 10 mg  10 mg Rectal Daily Leroy SeaSingh, Prashant K, MD   10 mg at 08/04/17 1009  . budesonide (PULMICORT) nebulizer solution  0.5 mg  0.5 mg Nebulization BID Canary Brimllis, Brandi L, NP   0.5 mg at 08/04/17 0918  . docusate sodium (COLACE) capsule 200 mg  200 mg Oral BID Leroy SeaSingh, Prashant K, MD   200 mg at 08/04/17 1008  . enoxaparin (LOVENOX) injection 40 mg  40 mg Subcutaneous Q24H Arrien, York RamMauricio Daniel, MD   40 mg at 08/03/17 1822  . furosemide (LASIX) tablet 40 mg  40 mg Oral Daily Leroy SeaSingh, Prashant K, MD   40 mg at 08/04/17 1008  . ipratropium-albuterol (DUONEB) 0.5-2.5 (3) MG/3ML nebulizer solution 3 mL  3 mL Nebulization Q2H PRN Arrien, York RamMauricio Daniel, MD      . ipratropium-albuterol (DUONEB) 0.5-2.5 (3) MG/3ML nebulizer solution 3 mL  3 mL Nebulization Q6H Ollis, Brandi L, NP   3 mL at 08/04/17 0906  . labetalol (NORMODYNE,TRANDATE) injection 10 mg   10 mg Intravenous Q2H PRN Nelda BucksFeinstein, Daniel J, MD   10 mg at 08/03/17 0644  . LORazepam (ATIVAN) tablet 1 mg  1 mg Oral Q6H PRN Tyrone NineGrunz, Ryan B, MD   1 mg at 08/04/17 0530  . methylPREDNISolone sodium succinate (SOLU-MEDROL) 40 mg/mL injection 40 mg  40 mg Intravenous Daily Nelda BucksFeinstein, Daniel J, MD   40 mg at 08/04/17 1008  . nicotine (NICODERM CQ - dosed in mg/24 hours) patch 21 mg  21 mg Transdermal Daily Meredeth IdeLama, Gagan S, MD   21 mg at 08/04/17 1009  . ondansetron (ZOFRAN) tablet 4 mg  4 mg Oral Q6H PRN Arrien, York RamMauricio Daniel, MD       Or  . ondansetron [facility](ZOFRAN) injection 4 mg  4 mg Intravenous Q6H PRN Arrien, York RamMauricio Daniel, MD      . polyethylene glycol (MIRALAX / GLYCOLAX) packet 17 g  17 g Oral BID Leroy SeaSingh, Prashant K, MD   17 g at 08/04/17 1008  . raloxifene (EVISTA) tablet 60 mg  60 mg Oral Daily Arrien, York RamMauricio Daniel, MD   60 mg at 08/04/17 1008  . sodium chloride flush (NS) 0.9 % injection 3 mL  3 mL Intravenous Q12H Arrien, York RamMauricio Daniel, MD   3 mL at 08/04/17 1009  . sodium chloride flush (NS) 0.9 % injection 3 mL  3 mL Intravenous PRN Arrien, York RamMauricio Daniel, MD      . traZODone (DESYREL) tablet 100 mg  100 mg Oral QHS PRN Tyrone NineGrunz, Ryan B, MD   100 mg at 07/29/17 2100     Discharge Medications: Please see discharge summary for a list of discharge medications.  Relevant Imaging Results:  Relevant Lab Results:   Additional Information SSN [id number]  Montine CircleKelsy Makeisha Jentsch, LCSW

## 2017-08-04 NOTE — Progress Notes (Signed)
Date: August 04, 2017 Katie Waller, BSN, [place]3, [place]  [phone] Chart and notes review for patient progress and needs./Remains on bipap. Will follow for case management and discharge needs. Next review date: [id number]

## 2017-08-04 NOTE — Clinical Social Work Note (Signed)
Clinical Social Work Assessment  Patient Details  Name: Katie Waller MRN: [id number] Date of Birth: 07/19/1946  Date of referral:  08/04/17               Reason for consult:  Facility Placement                Permission sought to share information with:  Family Supports Permission granted to share information::  Yes, Verbal Permission Granted  Name::        Agency::  SNF  Relationship::  Daughter/Son  Contact Information:     Housing/Transportation Living arrangements for the past 2 months:  Single Family Home Source of Information:  Adult Children Patient Interpreter Needed:  None Criminal Activity/Legal Involvement Pertinent to Current Situation/Hospitalization:  No - Comment as needed Significant Relationships:  Adult Children, Other Family Members Lives with:  Self Do you feel safe going back to the place where you live?  Yes Need for family participation in patient care:  Yes (Comment)  Care giving concerns: Patient coming from home for Dyspnea. The patient presents progressive dyspnea over the last three weeks, more sever over the last three days.  The patient has a decreased level of energy.  The patient was evaluated by physical therapist and recommends SNF. The patient is agreeable to complete short rehab.    Social Worker assessment / plan: CSW met the patient and adult children at bedside. CSW discussed SNF placement and options. CSW explain SNF process and later following up with bed offers.  The patient and family prefer a facility in [place] are agreeable to fax patient to [place] area.  CSW completed FL2/PASRR  Plan: Assist with discharge to SNF.   Employment status:  Retired Nurse, adult PT Recommendations:  [place] / Referral to community resources:  [place]  Patient/Family's Response to care:  Agreeable and Responding well to care.   Patient/Family's Understanding of and  Emotional Response to Diagnosis, Current Treatment, and Prognosis: "There is always a family member here to learn what is going on."  -We want her to regain her independence.    Emotional Assessment Appearance:  Appears stated age Attitude/Demeanor/Rapport:    Affect (typically observed):  Accepting, Calm Orientation:  Oriented to Place, Oriented to  Time, Oriented to Situation, Oriented to Self Alcohol / Substance use:  Not Applicable Psych involvement (Current and /or in the community):  No (Comment)  Discharge Needs  Concerns to be addressed:  Discharge Planning Concerns Readmission within the last 30 days:  No Current discharge risk:  Dependent with Mobility Barriers to Discharge:  Continued Medical Work up   [employer], [place] 08/04/2017, 2:40 PM

## 2017-08-05 LAB — MAGNESIUM: MAGNESIUM: 2.4 mg/dL (ref 1.7–2.4)

## 2017-08-05 MED ORDER — PHENOL 1.4 % MT LIQD
1.0000 | OROMUCOSAL | Status: DC | PRN
  Filled 2017-08-05: qty 177

## 2017-08-05 NOTE — Clinical Social Work Placement (Signed)
   CLINICAL SOCIAL WORK PLACEMENT  NOTE  Date:  08/05/2017  Patient Details  Name: Harland DingwallVelma L Lieber MRN: [id number] Date of Birth: 1946/06/20  Clinical Social Work is seeking post-discharge placement for this patient at the Skilled  Nursing Facility level of care (*CSW will initial, date and re-position this form in  chart as items are completed):  Yes   Patient/family provided with [facility] Clinical Social Work Department's list of facilities offering this level of care within the geographic area requested by the patient (or if unable, by the patient's family).  Yes   Patient/family informed of their freedom to choose among providers that offer the needed level of care, that participate in Medicare, Medicaid or managed care program needed by the patient, have an available bed and are willing to accept the patient.  Yes   Patient/family informed of [facility]'s ownership interest in [facility] and [facility], as well as of the fact that they are under no obligation to receive care at these facilities.  PASRR submitted to EDS on 08/04/17     PASRR number received on 08/04/17     Existing PASRR number confirmed on       FL2 transmitted to all facilities in geographic area requested by pt/family on 08/04/17     FL2 transmitted to all facilities within larger geographic area on       Patient informed that his/her managed care company has contracts with or will negotiate with certain facilities, including the following:        Yes   Patient/family informed of bed offers received.  Patient chooses bed at       Physician recommends and patient chooses bed at      Patient to be transferred to   on  .  Patient to be transferred to facility by       Patient family notified on   of transfer.  Name of family member notified:        PHYSICIAN       Additional Comment:    _______________________________________________ Antionette PolesKimberly L Jiya Kissinger, LCSW 08/05/2017, 2:55 PM

## 2017-08-05 NOTE — Progress Notes (Signed)
[facility]  PROGRESS NOTE  Katie Waller UJW:[id number] DOB: 06-11-1946 DOA: 07/24/2017 PCP: Katie Waller   Brief HPI:   71 year old female with history of COPD not adherent to prescribed home oxygen, asthma, diabetes mellitus, hypertension, active tobacco use presented with worsening dyspnea over the past month. CXR showed hyperinflation and diaphragm flattening with no infiltrate. She was admitted with COPD exacerbation. Echo showed EF 65-70%, G1DD with normal RV and PA caliber/pressures. Work of breathing remained elevated as of 11/21 despite IV steroids, azithromycin, and nebulizer treatments, prompting use of BiPAP and transfer to SDU. Pulmonology consulted. She has continued to require NIPPV intermittently.    Subjective   Patient seen and examined, breathing is improved.   Assessment/Plan:     1. Acute on chronic hypoxic/hypercapnic respiratory failure-secondary to COPD noncompliant with home oxygen, tobacco abuse.  Was placed on BiPAP which has been currently taken off.  CT was negative for PE, chest x-ray was negative for infiltrate.  CCM was consulted patient  on IV steroids, nebulizer treatment. 2. Acute on chronic diastolic CHF-patient was started on IV Lasix, the dose of Lasix has been changed to 40 mg p.o daily. by P CCM.   3. Essential hypertension-continue Lasix, losartan is on hold. 4. Tobacco use-counseled to quit, continue nicotine patch 5. Nonspecific CT finding of liver density-ultrasound of the liver showed small cystic areas within the left hepatic lobe most compatible with a benign cyst.  Repeat ultrasound in 6-12 months to ensure stability.    DVT prophylaxis: Lovenox  Code Status: DNR  Family Communication: Discussed with patient's daughter at bedside  Disposition Plan: SNF  Consultants:  Patient centimeters  Procedures:  None  Continuous infusions . sodium chloride        Antibiotics:   Anti-infectives (From admission,  onward)   Start     Dose/Rate Route Frequency Ordered Stop   07/24/17 1730  azithromycin (ZITHROMAX) tablet 500 mg     500 mg Oral Daily 07/24/17 1647 07/28/17 1051       Objective   Vitals:   08/05/17 1135 08/05/17 1240 08/05/17 1400 08/05/17 1446  BP:  (!) 116/59 (!) 109/57   Pulse:  (!) 113 (!) 113 (!) 120  Resp:    (!) 24  Temp: 98.7 F (37.1 C)     TempSrc: Oral     SpO2:  95% 92% 93%  Weight:      Height:       No intake or output data in the 24 hours ending 08/05/17 1549 Filed Weights   08/03/17 0500 08/04/17 0500 08/05/17 0410  Weight: 61 kg (134 lb 7.7 oz) 62 kg (136 lb 11 oz) 62.2 kg (137 lb 2 oz)     Physical Examination:  Physical Exam: Eyes: No icterus, extraocular muscles intact  Mouth: Oral mucosa is moist, no lesions on palate,  Neck: Supple, no deformities, masses, or tenderness Lungs: Normal respiratory effort, decreased breath sounds bilaterally Heart: Regular rate and rhythm, S1 and S2 normal, no murmurs, rubs auscultated Abdomen: BS normoactive,soft,nondistended,non-tender to palpation,no organomegaly Extremities: No pretibial edema, no erythema, no cyanosis, no clubbing Neuro : Alert and oriented to time, place and person, No focal deficits Skin: No rashes seen on exam   Data Reviewed: I have personally reviewed following labs and imaging studies  CBG: No results for input(s): GLUCAP in the last 168 hours.  CBC: Recent Labs  Lab 07/30/17 0313 08/01/17 0330 08/02/17 0338 08/03/17 0354 08/04/17 0331  WBC 9.3 11.5* 13.7* 12.0*  11.1*  NEUTROABS 8.4*  --   --   --   --   HGB 13.6 13.4 13.6 13.4 13.4  HCT 44.4 44.9 44.2 42.9 43.4  MCV 95.5 98.7 96.1 94.5 95.4  PLT 275 246 225 211 226    Basic Metabolic Panel: Recent Labs  Lab 07/31/17 1757 08/01/17 0330 08/02/17 0338 08/03/17 0354 08/04/17 0331 08/05/17 0306  NA 142 142 143 144 142  --   K 4.1 4.4 4.2 4.7 4.1  --   CL 90* 90* 89* 94* 92*  --   CO2 44* 45* 46* 45* 42*  --    GLUCOSE 197* 153* 143* 127* 95  --   BUN 28* 30* 31* 29* 25*  --   CREATININE 0.64 0.59 0.52 0.50 0.53  --   CALCIUM 8.5* 8.7* 8.9 8.8* 8.6*  --   MG  --  2.5* 2.4 2.4 2.3 2.4  PHOS  --   --   --  3.5  --   --     Recent Results (from the past 240 hour(s))  MRSA PCR Screening     Status: None   Collection Time: 07/26/17  9:19 PM  Result Value Ref Range Status   MRSA by PCR NEGATIVE NEGATIVE Final    Comment:        The GeneXpert MRSA Assay (FDA approved for NASAL specimens only), is one component of a comprehensive MRSA colonization surveillance program. It is not intended to diagnose MRSA infection nor to guide or monitor treatment for MRSA infections.      Liver Function Tests: No results for input(s): AST, ALT, ALKPHOS, BILITOT, PROT, ALBUMIN in the last 168 hours. No results for input(s): LIPASE, AMYLASE in the last 168 hours. No results for input(s): AMMONIA in the last 168 hours.  Cardiac Enzymes: No results for input(s): CKTOTAL, CKMB, CKMBINDEX, TROPONINI in the last 168 hours. BNP (last 3 results) Recent Labs    07/24/17 1100  BNP 84.9    ProBNP (last 3 results) No results for input(s): PROBNP in the last 8760 hours.    Studies: No results found.  Scheduled Meds: . arformoterol  15 mcg Nebulization BID  . aspirin  81 mg Oral Daily  . bisacodyl  10 mg Rectal Daily  . budesonide (PULMICORT) nebulizer solution  0.5 mg Nebulization BID  . docusate sodium  200 mg Oral BID  . enoxaparin (LOVENOX) injection  40 mg Subcutaneous Q24H  . furosemide  40 mg Oral Daily  . ipratropium-albuterol  3 mL Nebulization Q6H  . methylPREDNISolone (SOLU-MEDROL) injection  40 mg Intravenous Daily  . nicotine  21 mg Transdermal Daily  . polyethylene glycol  17 g Oral BID  . raloxifene  60 mg Oral Daily  . sodium chloride flush  3 mL Intravenous Q12H      Time spent: 25 min  Meredeth IdeGagan S Laurieanne Galloway   [facility] Pager [phone]. If 7PM-7AM, please contact  night-coverage at [email or web address], Office  [phone]  password [email or web address]  08/05/2017, 3:49 PM  LOS: 12 days

## 2017-08-05 NOTE — Progress Notes (Signed)
Pt currently on 4 LPM [place] and tolerating well at this time.  Pt in no noted distress, RT will hold BIPAP.  RT to monitor and assess as needed.  

## 2017-08-06 LAB — MAGNESIUM: MAGNESIUM: 2 mg/dL (ref 1.7–2.4)

## 2017-08-06 MED ORDER — IPRATROPIUM-ALBUTEROL 0.5-2.5 (3) MG/3ML IN SOLN
3.0000 mL | Freq: Three times a day (TID) | RESPIRATORY_TRACT | Status: DC
  Administered 2017-08-06 – 2017-08-07 (×4): 3 mL via RESPIRATORY_TRACT
  Filled 2017-08-06 (×4): qty 3

## 2017-08-06 MED ORDER — HYDROCODONE-ACETAMINOPHEN 5-325 MG PO TABS
1.0000 | ORAL_TABLET | Freq: Four times a day (QID) | ORAL | Status: DC | PRN
  Administered 2017-08-06 (×2): 1 via ORAL
  Filled 2017-08-06 (×2): qty 1

## 2017-08-06 NOTE — Progress Notes (Signed)
[facility]  PROGRESS NOTE  Katie DingwallVelma L Veitch ZOX:[id number] DOB: 1946/04/25 DOA: 07/24/2017 PCP: Elias Elseeade, Robert, MD   Brief HPI:   71 year old female with history of COPD not adherent to prescribed home oxygen, asthma, diabetes mellitus, hypertension, active tobacco use presented with worsening dyspnea over the past month. CXR showed hyperinflation and diaphragm flattening with no infiltrate. She was admitted with COPD exacerbation. Echo showed EF 65-70%, G1DD with normal RV and PA caliber/pressures. Work of breathing remained elevated as of 11/21 despite IV steroids, azithromycin, and nebulizer treatments, prompting use of BiPAP and transfer to SDU. Pulmonology consulted. She has continued to require NIPPV intermittently.    Subjective   Patient seen and examined, breathing is improved.  She did not require BiPAP in the last 24 hours.  Complains of back pain   Assessment/Plan:     1. Acute on chronic hypoxic/hypercapnic respiratory failure-resolved, secondary to COPD,  noncompliant with home oxygen, tobacco abuse.  Was placed on BiPAP which has been currently taken off.  CT was negative for PE, chest x-ray was negative for infiltrate.  CCM was consulted patient  on IV steroids, nebulizer treatment. 2. Acute on chronic diastolic CHF-patient was started on IV Lasix, the dose of Lasix has been changed to 40 mg p.o daily. by PCCM.   3. Essential hypertension-continue Lasix, losartan is on hold. 4. Back pain-Vicodin as needed for pain 5. Tobacco use-counseled to quit, continue nicotine patch 6. Nonspecific CT finding of liver density-ultrasound of the liver showed small cystic areas within the left hepatic lobe most compatible with a benign cyst.  Repeat ultrasound in 6-12 months to ensure stability.    DVT prophylaxis: Lovenox  Code Status: DNR  Family Communication: Discussed with patient's daughter at bedside  Disposition Plan: SNF  Consultants:  Patient  centimeters  Procedures:  None  Continuous infusions . sodium chloride        Antibiotics:   Anti-infectives (From admission, onward)   Start     Dose/Rate Route Frequency Ordered Stop   07/24/17 1730  azithromycin (ZITHROMAX) tablet 500 mg     500 mg Oral Daily 07/24/17 1647 07/28/17 1051       Objective   Vitals:   08/06/17 0709 08/06/17 0714 08/06/17 0800 08/06/17 0943  BP: (!) 143/71     Pulse:  (!) 103  (!) 105  Resp:  (!) 24  (!) 22  Temp:   98.9 F (37.2 C)   TempSrc:   Oral   SpO2:  96%  92%  Weight:      Height:        Intake/Output Summary (Last 24 hours) at 08/06/2017 1031 Last data filed at 08/06/2017 0646 Gross per 24 hour  Intake 474 ml  Output 325 ml  Net 149 ml   Filed Weights   08/03/17 0500 08/04/17 0500 08/05/17 0410  Weight: 61 kg (134 lb 7.7 oz) 62 kg (136 lb 11 oz) 62.2 kg (137 lb 2 oz)     Physical Examination:  Physical Exam: Eyes: No icterus, extraocular muscles intact  Mouth: Oral mucosa is moist, no lesions on palate,  Neck: Supple, no deformities, masses, or tenderness Lungs: Normal respiratory effort, bilateral clear to auscultation, no crackles or wheezes.  Heart: Regular rate and rhythm, S1 and S2 normal, no murmurs, rubs auscultated Abdomen: BS normoactive,soft,nondistended,non-tender to palpation,no organomegaly Extremities: No pretibial edema, no erythema, no cyanosis, no clubbing Neuro : Alert and oriented to time, place and person, No focal deficits Skin: No rashes seen  on exam   Data Reviewed: I have personally reviewed following labs and imaging studies  CBG: No results for input(s): GLUCAP in the last 168 hours.  CBC: Recent Labs  Lab 08/01/17 0330 08/02/17 0338 08/03/17 0354 08/04/17 0331  WBC 11.5* 13.7* 12.0* 11.1*  HGB 13.4 13.6 13.4 13.4  HCT 44.9 44.2 42.9 43.4  MCV 98.7 96.1 94.5 95.4  PLT 246 225 211 226    Basic Metabolic Panel: Recent Labs  Lab 07/31/17 1757  08/01/17 0330  08/02/17 0338 08/03/17 0354 08/04/17 0331 08/05/17 0306 08/06/17 0332  NA 142  --  142 143 144 142  --   --   K 4.1  --  4.4 4.2 4.7 4.1  --   --   CL 90*  --  90* 89* 94* 92*  --   --   CO2 44*  --  45* 46* 45* 42*  --   --   GLUCOSE 197*  --  153* 143* 127* 95  --   --   BUN 28*  --  30* 31* 29* 25*  --   --   CREATININE 0.64  --  0.59 0.52 0.50 0.53  --   --   CALCIUM 8.5*  --  8.7* 8.9 8.8* 8.6*  --   --   MG  --    < > 2.5* 2.4 2.4 2.3 2.4 2.0  PHOS  --   --   --   --  3.5  --   --   --    < > = values in this interval not displayed.    No results found for this or any previous visit (from the past 240 hour(s)).    Cardiac Enzymes: No results for input(s): CKTOTAL, CKMB, CKMBINDEX, TROPONINI in the last 168 hours. BNP (last 3 results) Recent Labs    07/24/17 1100  BNP 84.9    ProBNP (last 3 results) No results for input(s): PROBNP in the last 8760 hours.    Studies: No results found.  Scheduled Meds: . arformoterol  15 mcg Nebulization BID  . aspirin  81 mg Oral Daily  . bisacodyl  10 mg Rectal Daily  . budesonide (PULMICORT) nebulizer solution  0.5 mg Nebulization BID  . docusate sodium  200 mg Oral BID  . enoxaparin (LOVENOX) injection  40 mg Subcutaneous Q24H  . furosemide  40 mg Oral Daily  . ipratropium-albuterol  3 mL Nebulization TID  . methylPREDNISolone (SOLU-MEDROL) injection  40 mg Intravenous Daily  . nicotine  21 mg Transdermal Daily  . polyethylene glycol  17 g Oral BID  . raloxifene  60 mg Oral Daily  . sodium chloride flush  3 mL Intravenous Q12H      Time spent: 25 min  Meredeth IdeGagan S Nolon Yellin   [facility] Pager [phone]. If 7PM-7AM, please contact night-coverage at [email or web address], Office  [phone]  password [email or web address]  08/06/2017, 10:31 AM  LOS: 13 days

## 2017-08-07 LAB — MAGNESIUM: MAGNESIUM: 2.1 mg/dL (ref 1.7–2.4)

## 2017-08-07 LAB — CREATININE, SERUM
CREATININE: 0.67 mg/dL (ref 0.44–1.00)
GFR calc non Af Amer: >60 mL/min (ref >60)

## 2017-08-07 MED ORDER — DOCUSATE SODIUM 100 MG PO CAPS: 200.0000 mg | ORAL_CAPSULE | Freq: Two times a day (BID) | ORAL | 0 refills | Status: DC

## 2017-08-07 MED ORDER — NICOTINE 21 MG/24HR TD PT24: 21.0000 mg | MEDICATED_PATCH | Freq: Every day | TRANSDERMAL | 0 refills | Status: DC

## 2017-08-07 MED ORDER — LORAZEPAM 1 MG PO TABS: 1.0000 mg | ORAL_TABLET | Freq: Four times a day (QID) | ORAL | 0 refills | Status: DC | PRN

## 2017-08-07 MED ORDER — HYDROCODONE-ACETAMINOPHEN 5-325 MG PO TABS: 1.0000 | ORAL_TABLET | Freq: Four times a day (QID) | ORAL | 0 refills | Status: DC | PRN

## 2017-08-07 MED ORDER — TRAZODONE HCL 100 MG PO TABS: 100.0000 mg | ORAL_TABLET | Freq: Every evening | ORAL | 0 refills | Status: DC | PRN

## 2017-08-07 MED ORDER — IPRATROPIUM-ALBUTEROL 0.5-2.5 (3) MG/3ML IN SOLN: 3.0000 mL | Freq: Three times a day (TID) | RESPIRATORY_TRACT | 0 refills | Status: DC

## 2017-08-07 MED ORDER — PREDNISONE 10 MG PO TABS: ORAL_TABLET | ORAL | 0 refills | Status: DC

## 2017-08-07 MED ORDER — ARFORMOTEROL TARTRATE 15 MCG/2ML IN NEBU
15.0000 ug | INHALATION_SOLUTION | Freq: Two times a day (BID) | RESPIRATORY_TRACT | Status: DC
Start: 2017-08-07 — End: 2017-09-11

## 2017-08-07 MED ORDER — BUDESONIDE 0.5 MG/2ML IN SUSP: 0.5000 mg | Freq: Two times a day (BID) | RESPIRATORY_TRACT | 12 refills | Status: DC

## 2017-08-07 NOTE — Progress Notes (Signed)
Darah, RN given report. Patient with no complaints at the current time. Will transfer via [facility]. D/c summary signed and copy sent with patient. PIV d/c'd.

## 2017-08-07 NOTE — Discharge Summary (Signed)
Physician Discharge Summary  Katie Waller RUE:[id number] DOB: 28-Dec-1945 DOA: 07/24/2017  PCP: Elias Else, MD  Admit date: 07/24/2017 Discharge date: 08/07/2017  Time spent: 35* minutes  Recommendations for Outpatient Follow-up:  1. Follow up Pulmonary clinic on 08/17/17 at 9 am   Discharge Diagnoses:  Principal Problem:   COPD exacerbation (HCC) Active Problems:   Smoker   Sinus tachycardia   Essential hypertension   Acute respiratory failure (HCC)   Chronic diastolic CHF (congestive heart failure) (HCC)   Acute on chronic respiratory failure with hypercapnia (HCC)   Palliative care encounter   Goals of care, counseling/discussion   Discharge Condition: Stable  Diet recommendation: Heart healthy diet  Filed Weights   08/05/17 0410 08/06/17 1800 08/07/17 0500  Weight: 62.2 kg (137 lb 2 oz) 61.4 kg (135 lb 5.8 oz) 59.5 kg (131 lb 2.8 oz)    History of present illness:  71 year old female with history of COPD not adherent to prescribed home oxygen, asthma, diabetes mellitus, hypertension, active tobacco use presented with worsening dyspnea over the past month. CXR showed hyperinflation and diaphragm flattening with no infiltrate. She was admitted with COPD exacerbation. Echo showed EF 65-70%, G1DD with normal RV and PA caliber/pressures. Work of breathing remained elevated as of 11/21 despite IV steroids, azithromycin, and nebulizer treatments, prompting use of BiPAP and transfer to SDU. Pulmonology consulted. She has continued to require NIPPV intermittently.    Hospital Course:  1. Acute on chronic hypoxic/hypercapnic respiratory failure-resolved, secondary to COPD noncompliant with home oxygen, tobacco abuse.  Was placed on BiPAP which has been currently taken off.  CT was negative for PE, chest x-ray was negative for infiltrate.  CCM consulted patient currently on IV steroids, nebulizer treatment.  Will discharge on Lasix 40 mg p.o. daily, prednisone taper, Brovana,  Pulmicort, DuoNeb nebulizers 3 times a day. 2. Acute on chronic diastolic CHF-patient was started on IV Lasix, the dose of Lasix has been changed to 40 mg p.o daily. by P CCM.    Continue with Lasix 40 mg p.o. daily 3. Essential hypertension-continue Lasix, losartan is on hold.  Blood pressure is well controlled, will discontinue losartan. 4. Tobacco use-counseled to quit, continue nicotine patch 5. Nonspecific CT finding of liver density-ultrasound of the liver showed small cystic areas within the left hepatic lobe most compatible with a benign cyst.  Repeat ultrasound in 6-12 months to ensure stability.  Discussed with patient's son at bedside     Procedures:  None  Consultations:     None  Discharge Exam: Vitals:   08/07/17 0929 08/07/17 0935  BP:    Pulse:    Resp:    Temp:    SpO2: 97% 97%    General: Appears in no acute distress Cardiovascular: S1-S2, regular Respiratory: Clear to auscultation bilaterally  Discharge Instructions   Discharge Instructions    Diet - low sodium heart healthy   Complete by:  As directed    Increase activity slowly   Complete by:  As directed      Allergies as of 08/07/2017      Reactions   Ace Inhibitors Swelling   Codeine Nausea And Vomiting      Medication List    STOP taking these medications   dextromethorphan-guaiFENesin 30-600 MG 12hr tablet Commonly known as:  MUCINEX DM   losartan 50 MG tablet Commonly known as:  COZAAR   SYMBICORT 160-4.5 MCG/ACT inhaler Generic drug:  budesonide-formoterol   tiotropium 18 MCG inhalation capsule Commonly known as:  SPIRIVA     TAKE these medications   albuterol 108 (90 Base) MCG/ACT inhaler Commonly known as:  PROVENTIL HFA;VENTOLIN HFA Inhale 2 puffs into the lungs every 6 (six) hours as needed for wheezing or shortness of breath.   arformoterol 15 MCG/2ML Nebu Commonly known as:  BROVANA Take 2 mLs (15 mcg total) by nebulization 2 (two) times daily.   aspirin 81 MG  chewable tablet Chew 1 tablet (81 mg total) by mouth daily.   benzonatate 100 MG capsule Commonly known as:  TESSALON Take 200 mg 3 (three) times daily as needed by mouth for cough. What changed:  Another medication with the same name was removed. Continue taking this medication, and follow the directions you see here.   budesonide 0.5 MG/2ML nebulizer solution Commonly known as:  PULMICORT Take 2 mLs (0.5 mg total) by nebulization 2 (two) times daily.   docusate sodium 100 MG capsule Commonly known as:  COLACE Take 2 capsules (200 mg total) by mouth 2 (two) times daily.   furosemide 40 MG tablet Commonly known as:  LASIX Take 40 mg daily by mouth.   HYDROcodone-acetaminophen 5-325 MG tablet Commonly known as:  NORCO/VICODIN Take 1 tablet by mouth every 6 (six) hours as needed for moderate pain.   ipratropium-albuterol 0.5-2.5 (3) MG/3ML Soln Commonly known as:  DUONEB Take 3 mLs by nebulization 3 (three) times daily.   LORazepam 1 MG tablet Commonly known as:  ATIVAN Take 1 tablet (1 mg total) by mouth every 6 (six) hours as needed for anxiety.   nicotine 21 mg/24hr patch Commonly known as:  NICODERM CQ - dosed in mg/24 hours Place 1 patch (21 mg total) onto the skin daily. Start taking on:  08/08/2017   predniSONE 10 MG tablet Commonly known as:  DELTASONE Prednisone 40 mg po daily x 3 day then Prednisone 30 mg po daily x 3 day then Prednisone 20 mg po daily x 3 day then Prednisone 10 mg daily x 2 day then stop... What changed:    how much to take  how to take this  when to take this  additional instructions  Another medication with the same name was removed. Continue taking this medication, and follow the directions you see here.   raloxifene 60 MG tablet Commonly known as:  EVISTA Take 60 mg daily by mouth.   T.E.D. BELOW KNEE/LARGE Misc 1 each by Does not apply route daily. On in AM and off in PM Please measure patient and give appropriate size.    traZODone 100 MG tablet Commonly known as:  DESYREL Take 1 tablet (100 mg total) by mouth at bedtime as needed for sleep.      Allergies  Allergen Reactions  . Ace Inhibitors Swelling  . Codeine Nausea And Vomiting   Follow-up Information    Bevelyn NgoGroce, Sarah F, NP Follow up on 08/17/2017.   Specialty:  Pulmonary Disease Why:  Appt at 9:00.  Arrive at 8:45 am for check in.  Contact information: [address] [place] [address] [phone]            The results of significant diagnostics from this hospitalization (including imaging, microbiology, ancillary and laboratory) are listed below for reference.    Significant Diagnostic Studies: Dg Chest 2 View  Result Date: 07/24/2017 CLINICAL DATA:  Severe dyspnea, cough and chest congestion. Symptoms have worsened yesterday and today. History of COPD, current smoker. EXAM: CHEST  2 VIEW COMPARISON:  Chest x-ray of Jan 13, 2017 FINDINGS: The lungs remain hyperinflated with hemidiaphragm flattening. There is no focal infiltrate. There is no pleural effusion. The cardiac silhouette is top-normal in size. The pulmonary vascularity is not engorged. There is calcification in the wall of the thoracic aorta. There is no pleural effusion or pneumothorax. The bony thorax is unremarkable. IMPRESSION: COPD.  No acute pneumonia nor pulmonary edema. Thoracic aortic atherosclerosis. Electronically Signed   By: David  [place] M.D.   On: 07/24/2017 11:59   Ct Angio Chest Pe W Or Wo Contrast  Result Date: 07/31/2017 CLINICAL DATA:  Worsening dyspnea for 1 month EXAM: CT ANGIOGRAPHY CHEST WITH CONTRAST TECHNIQUE: Multidetector CT imaging of the chest was performed using the standard protocol during bolus administration of intravenous contrast. Multiplanar CT image reconstructions and MIPs were obtained to evaluate the vascular anatomy. CONTRAST:  ISOVUE-370 IOPAMIDOL (ISOVUE-370) INJECTION 76% COMPARISON:  01/20/2017 FINDINGS:  Cardiovascular: There are no filling defects in the pulmonary arterial tree to suggest acute pulmonary thromboembolism. Extensive atherosclerotic calcifications of the aortic arch and descending thoracic aorta. There are atherosclerotic changes of the great vessels. There is prominent plaque at the origin of the left subclavian artery. Calcification makes quantification of narrowing difficult. Significant narrowing cannot be excluded. There is also calcified plaque at the origin of the vertebral arteries left greater than right. Moderate 3 vessel coronary artery calcification. There is no evidence of dissection or aortic aneurysm. Mediastinum/Nodes: No abnormal mediastinal adenopathy. No pericardial effusion. Visualized thyroid is unremarkable. Esophagus is unremarkable. Lungs/Pleura: Dependent atelectasis at the lung bases. Advanced panlobular emphysema. No pneumothorax or pleural effusion. Upper Abdomen: Low-density lesion in the lateral segment of the left lobe of the liver is stable at 15 mm. There are other low-density lesions scattered throughout the liver which were not visualized on the prior study but that is most likely due to technique. Musculoskeletal: No vertebral compression deformity. Review of the MIP images confirms the above findings. IMPRESSION: No evidence of acute pulmonary thromboembolism. Prominent atherosclerotic changes throughout the thorax. Significant narrowing at the origin of the left subclavian artery cannot be excluded. There are low-density lesions in the liver. The largest is stable. Other lesions were not clearly identified on the prior study. If there is a history of malignancy or risk of malignancy, consider six-month follow-up MRI. Aortic Atherosclerosis (ICD10-I70.0) and Emphysema (ICD10-J43.9). Electronically Signed   By: Jolaine Click M.D.   On: 07/31/2017 14:12   US Abdomen Limited  Result Date: 08/02/2017 CLINICAL DATA:  Liver mass on CT EXAM: ULTRASOUND ABDOMEN LIMITED  RIGHT UPPER QUADRANT COMPARISON:  Chest CT 07/31/2017 FINDINGS: Gallbladder: No gallstones or wall thickening visualized. No sonographic Murphy sign noted by sonographer. Common bile duct: Diameter: Normal caliber, 3 mm Liver: Small cysts in the left hepatic lobe corresponding to the abnormalities seen on chest CT. These measure 1.3 cm and 0.9 cm maximally and appeared represent benign cysts. Portal vein is patent on color Doppler imaging with normal direction of blood flow towards the liver. IMPRESSION: Small cystic areas within the left hepatic lobe as above, most compatible with benign cysts. These could be followed with repeat ultrasound in 6-12 months to ensure stability. Electronically Signed   By: Charlett Nose M.D.   On: 08/02/2017 09:08   Dg Chest Port 1 View  Result Date: 08/03/2017 CLINICAL DATA:  Respiratory failure. EXAM: PORTABLE CHEST 1 VIEW COMPARISON:  08/01/2017 FINDINGS: The cardiac silhouette, mediastinal and hilar contours are within normal limits and stable. There is tortuosity and calcification of the thoracic  aorta. Low the lungs demonstrate stable emphysematous changes but no definite acute overlying pulmonary process. Mild pulmonary artery enlargement is stable. The bony thorax is intact. IMPRESSION: Stable emphysematous changes but no definite acute overlying pulmonary process. Electronically Signed   By: Rudie MeyerP.  Gallerani M.D.   On: 08/03/2017 07:45   Dg Chest Port 1 View  Result Date: 08/01/2017 CLINICAL DATA:  Respiratory failure EXAM: PORTABLE CHEST 1 VIEW COMPARISON:  07/30/2017 FINDINGS: Heart size and vascularity normal. Lungs remain clear without infiltrate or effusion. Atherosclerotic aorta. IMPRESSION: No active disease. Electronically Signed   By: Marlan Palauharles  Clark M.D.   On: 08/01/2017 06:51   Dg Chest Port 1 View  Result Date: 07/30/2017 CLINICAL DATA:  COPD. EXAM: PORTABLE CHEST 1 VIEW COMPARISON:  07/28/2017 FINDINGS: The cardiomediastinal silhouette is unchanged and  within normal limits. Aortic atherosclerosis is noted. The lungs are slightly less well inflated than on the prior study. No confluent airspace opacity, edema, sizable pleural effusion, or pneumothorax is identified. IMPRESSION: No evidence of pneumonia or edema. Electronically Signed   By: Sebastian AcheAllen  Grady M.D.   On: 07/30/2017 06:53   Dg Chest Port 1 View  Result Date: 07/28/2017 CLINICAL DATA:  Acute on chronic respiratory failure with hypercapnia. EXAM: PORTABLE CHEST 1 VIEW COMPARISON:  07/27/2017, 07/24/2017 and 01/13/2017 FINDINGS: The heart size and pulmonary vascularity are normal. Calcification in the thoracic aorta. The lungs are hyperinflated but clear. No effusions. IMPRESSION: No acute abnormality.  Hyperinflated lungs. Aortic Atherosclerosis (ICD10-I70.0). Electronically Signed   By: Francene BoyersJames  Maxwell M.D.   On: 07/28/2017 14:31   Dg Chest Port 1 View  Result Date: 07/27/2017 CLINICAL DATA:  Respiratory failure EXAM: PORTABLE CHEST 1 VIEW COMPARISON:  July 24, 2017 FINDINGS: Lungs appear slightly hyperexpanded. No evident edema or consolidation. Heart size and pulmonary vascularity are normal. No adenopathy. There is aortic atherosclerosis. There is a bone island at the level of the inferior glenoid on the right. IMPRESSION: Lungs mildly hyperexpanded without edema or consolidation. Stable cardiac silhouette. There is aortic atherosclerosis. Aortic Atherosclerosis (ICD10-I70.0). Electronically Signed   By: Bretta BangWilliam  Woodruff III M.D.   On: 07/27/2017 07:14    Microbiology: No results found for this or any previous visit (from the past 240 hour(s)).   Labs: Basic Metabolic Panel: Recent Labs  Lab 07/31/17 1757  08/01/17 0330 08/02/17 [id number] 08/03/17 0354 08/04/17 0331 08/05/17 0306 08/06/17 0332 08/07/17 0326  NA 142  --  142 143 144 142  --   --   --   K 4.1  --  4.4 4.2 4.7 4.1  --   --   --   CL 90*  --  90* 89* 94* 92*  --   --   --   CO2 44*  --  45* 46* 45* 42*  --   --    --   GLUCOSE 197*  --  153* 143* 127* 95  --   --   --   BUN 28*  --  30* 31* 29* 25*  --   --   --   CREATININE 0.64  --  0.59 0.52 0.50 0.53  --   --  0.67  CALCIUM 8.5*  --  8.7* 8.9 8.8* 8.6*  --   --   --   MG  --    < > 2.5* 2.4 2.4 2.3 2.4 2.0 2.1  PHOS  --   --   --   --  3.5  --   --   --   --    < > =  values in this interval not displayed.   Liver Function Tests: No results for input(s): AST, ALT, ALKPHOS, BILITOT, PROT, ALBUMIN in the last 168 hours. No results for input(s): LIPASE, AMYLASE in the last 168 hours. No results for input(s): AMMONIA in the last 168 hours. CBC: Recent Labs  Lab 08/01/17 0330 08/02/17 0338 08/03/17 0354 08/04/17 0331  WBC 11.5* 13.7* 12.0* 11.1*  HGB 13.4 13.6 13.4 13.4  HCT 44.9 44.2 42.9 43.4  MCV 98.7 96.1 94.5 95.4  PLT 246 225 211 226   Cardiac Enzymes: No results for input(s): CKTOTAL, CKMB, CKMBINDEX, TROPONINI in the last 168 hours. BNP: BNP (last 3 results) Recent Labs    07/24/17 1100  BNP 84.9    ProBNP (last 3 results) No results for input(s): PROBNP in the last 8760 hours.  CBG: No results for input(s): GLUCAP in the last 168 hours.     Signed:  Meredeth Ide MD.  [facility] 08/07/2017, 11:19 AM

## 2017-08-07 NOTE — Progress Notes (Signed)
Date: August 07, 2017 Chart review for discharge needs:  None found for case management. Patient has no questions concerning post hospital care. 

## 2017-08-07 NOTE — Progress Notes (Signed)
Date: August 07, 2017 Rhonda Davis, BSN, RN3, CCM [phone] Chart and notes review for patient progress and needs. Will follow for case management and discharge needs. Next review date: [id number] 

## 2017-08-07 NOTE — Clinical Social Work Placement (Addendum)
D/C Summary sent via HUB.  Daughter completing paperwork via Email.  [facility] called for transport.  Nurse given number for report/ DNR signed.   CLINICAL SOCIAL WORK PLACEMENT  NOTE  Date:  08/07/2017  Patient Details  Name: Katie DingwallVelma L Canizales MRN: [id number] Date of Birth: [birth year]  Clinical Social Work is seeking post-discharge placement for this patient at the Skilled  Nursing Facility level of care (*CSW will initial, date and re-position this form in  chart as items are completed):  Yes   Patient/family provided with [facility] Clinical Social Work Department's list of facilities offering this level of care within the geographic area requested by the patient (or if unable, by the patient's family).  Yes   Patient/family informed of their freedom to choose among providers that offer the needed level of care, that participate in Medicare, Medicaid or managed care program needed by the patient, have an available bed and are willing to accept the patient.  Yes   Patient/family informed of [facility]'s ownership interest in [facility] and [facility], as well as of the fact that they are under no obligation to receive care at these facilities.  PASRR submitted to EDS on 08/04/17     PASRR number received on 08/04/17     Existing PASRR number confirmed on       FL2 transmitted to all facilities in geographic area requested by pt/family on 08/04/17     FL2 transmitted to all facilities within larger geographic area on       Patient informed that his/her managed care company has contracts with or will negotiate with certain facilities, including the following:  [facility]     Yes   Patient/family informed of bed offers received.  [facility]     Physician recommends and patient chooses bed at      [facility].  Patient to be transferred to facility by [facility]     Patient family notified on 08/07/17 of  transfer.  Name of family member notified:  Daughter     PHYSICIAN Please sign DNR     Additional Comment:    _______________________________________________ Clearance CootsNicole A Ewel Lona, LCSW 08/07/2017, 12:47 PM

## 2017-08-17 ENCOUNTER — Inpatient Hospital Stay: Payer: Medicare Other | Admitting: Acute Care

## 2017-09-11 ENCOUNTER — Ambulatory Visit (INDEPENDENT_AMBULATORY_CARE_PROVIDER_SITE_OTHER): Payer: Medicare Other | Admitting: Acute Care

## 2017-09-11 ENCOUNTER — Encounter: Payer: Self-pay | Admitting: Acute Care

## 2017-09-11 VITALS — BP 128/66 | HR 100 | Ht 61.0 in | Wt 136.2 lb

## 2017-09-11 DIAGNOSIS — J9602 Acute respiratory failure with hypercapnia: Secondary | ICD-10-CM | POA: Diagnosis not present

## 2017-09-11 DIAGNOSIS — F17210 Nicotine dependence, cigarettes, uncomplicated: Secondary | ICD-10-CM | POA: Diagnosis not present

## 2017-09-11 DIAGNOSIS — J9601 Acute respiratory failure with hypoxia: Secondary | ICD-10-CM | POA: Diagnosis not present

## 2017-09-11 DIAGNOSIS — F17200 Nicotine dependence, unspecified, uncomplicated: Secondary | ICD-10-CM

## 2017-09-11 DIAGNOSIS — J441 Chronic obstructive pulmonary disease with (acute) exacerbation: Secondary | ICD-10-CM

## 2017-09-11 MED ORDER — IPRATROPIUM-ALBUTEROL 0.5-2.5 (3) MG/3ML IN SOLN: 3.0000 mL | Freq: Three times a day (TID) | RESPIRATORY_TRACT | 3 refills | Status: DC

## 2017-09-11 MED ORDER — ARFORMOTEROL TARTRATE 15 MCG/2ML IN NEBU: 15.0000 ug | INHALATION_SOLUTION | Freq: Two times a day (BID) | RESPIRATORY_TRACT | 3 refills | Status: DC

## 2017-09-11 NOTE — Progress Notes (Signed)
History of Present Illness Katie Waller is a 72 y.o. female smoker ( 55 pack year smoker)with COPD   09/11/2017 Hospital Follow Up: Pt was hospitalized 11/19-12/11/2016 for acute on chronic respiratory failure secondary to COPD and acute on chronic diastolic CHF. She was treated with IV  Antibiotics, IV steroids, Scheduled nebulizer treatments, BiPAP, lasix  and oxygen. CXR was clear of infiltrates. She went to rehab x 1 month after discharge for deconditioning. Pt. States she is doing well. She completed her prednisone taper. Her cough has resolved. No wheezing. She is using Albuterol nebs and Brovana nebs. She is not coughing up anything. What she does cough up is clear. She states she feels better. She continues to wear jer oxygen at 2 L C. She left the hospital 12/3 and went to [facility] 4 weeks. She still have dyspnea with minimal exertion. She is wearing oxygen at 2 L [place]. She was qualified for her oxygen at [facility] by her PCP. He is accepting responsibility for ordering and maintaining that order per the patient. She is wearing her compression hose. She states would like to take her second dose of Lasix earlier  in the day so she is not up all night using the rest room. She states she raises her feet whenever she is sitting down. She quit smoking 07/23/17 and is nor having to wear a nicoderm patch anymore.She denies fever, chest pain, orthopnea or hemoptysis.  Test Results:  CBC Latest Ref Rng & Units 08/04/2017 08/03/2017 08/02/2017  WBC 4.0 - 10.5 K/uL 11.1(H) 12.0(H) 13.7(H)  Hemoglobin 12.0 - 15.0 g/dL 16.113.4 09.613.4 04.513.6  Hematocrit 36.0 - 46.0 % 43.4 42.9 44.2  Platelets 150 - 400 K/uL 226 211 225    BMP Latest Ref Rng & Units 08/07/2017 08/04/2017 08/03/2017  Glucose 65 - 99 mg/dL - 95 409(W127(H)  BUN 6 - 20 mg/dL - 11(B25(H) 14(N29(H)  Creatinine 0.44 - 1.00 mg/dL 8.290.67 5.620.53 1.300.50  Sodium 135 - 145 mmol/L - 142 144  Potassium 3.5 - 5.1 mmol/L - 4.1 4.7  Chloride 101 - 111 mmol/L -  92(L) 94(L)  CO2 22 - 32 mmol/L - 42(H) 45(H)  Calcium 8.9 - 10.3 mg/dL - 8.6(L) 8.8(L)    BNP    Component Value Date/Time   BNP 84.9 07/24/2017 1100      Past medical hx Past Medical History:  Diagnosis Date  . Asthma   . Diabetes (HCC)   . Heart disease   . Hypertension      Social History   Tobacco Use  . Smoking status: Former Smoker    Types: Cigarettes    Start date: 08/22/2017  . Smokeless tobacco: Never Used  . Tobacco comment: vapor  Substance Use Topics  . Alcohol use: No  . Drug use: No    Katie Waller reports that she has quit smoking. Her smoking use included cigarettes. She started smoking about 2 weeks ago. she has never used smokeless tobacco. She reports that she does not drink alcohol or use drugs.  Tobacco Cessation: Former 55 pack year smoker who quit 08/22/2017.  Counseled extensively x 3-4  minutes  to remain smoke free.  Past surgical hx, Family hx, Social hx all reviewed.  Current Outpatient Medications on File Prior to Visit  Medication Sig  . albuterol (PROVENTIL HFA;VENTOLIN HFA) 108 (90 BASE) MCG/ACT inhaler Inhale 2 puffs into the lungs every 6 (six) hours as needed for wheezing or shortness of breath.  Marland Kitchen. aspirin  81 MG chewable tablet Chew 1 tablet (81 mg total) by mouth daily.  . budesonide (PULMICORT) 0.5 MG/2ML nebulizer solution Take 2 mLs (0.5 mg total) by nebulization 2 (two) times daily.  . furosemide (LASIX) 40 MG tablet Take 40 mg daily by mouth.  Marland Kitchen LORazepam (ATIVAN) 1 MG tablet Take 1 tablet (1 mg total) by mouth every 6 (six) hours as needed for anxiety.  . raloxifene (EVISTA) 60 MG tablet Take 60 mg daily by mouth.  . traZODone (DESYREL) 100 MG tablet Take 1 tablet (100 mg total) by mouth at bedtime as needed for sleep.   No current facility-administered medications on file prior to visit.      Allergies  Allergen Reactions  . Ace Inhibitors Swelling  . Codeine Nausea And Vomiting    Review Of  Systems:  Constitutional:   No  weight loss, night sweats,  Fevers, chills, fatigue, or  lassitude.  HEENT:   No headaches,  Difficulty swallowing,  Tooth/dental problems, or  Sore throat,                No sneezing, itching, ear ache, nasal congestion, post nasal drip,   CV:  No chest pain,  Orthopnea, PND, + swelling in lower extremities, anasarca, dizziness, palpitations, syncope.   GI  No heartburn, indigestion, abdominal pain, nausea, vomiting, diarrhea, change in bowel habits, loss of appetite, bloody stools.   Resp: + shortness of breath with exertion less at rest.  No excess mucus, no productive cough,  No non-productive cough,  No coughing up of blood.  No change in color of mucus.  No wheezing.  No chest wall deformity  Skin: no rash or lesions.  GU: no dysuria, change in color of urine, no urgency or frequency.  No flank pain, no hematuria   MS:  No joint pain or swelling.  No decreased range of motion.  No back pain.  Psych:  No change in mood or affect. No depression or anxiety.  No memory loss.   Vital Signs BP 128/66 (BP Location: Left Arm, Cuff Size: Normal)   Pulse 100   Ht 5\' 1"  (1.549 m)   Wt 136 lb 3.2 oz (61.8 kg)   SpO2 97%   BMI 25.73 kg/m    Physical Exam:  General- No distress,  A&Ox3, pleasant wearing oxygen ENT: No sinus tenderness, TM clear, pale nasal mucosa, no oral exudate,no post nasal drip, no LAN Cardiac: S1, S2, regular rate and rhythm, no murmur Chest: No wheeze/ rales/ dullness; no accessory muscle use, no nasal flaring, no sternal retractions Abd.: Soft Non-tender Ext: No clubbing cyanosis, 1+ LE edema, L>R Neuro:Deconditioned, using walker Skin: No rashes, warm and dry Psych: normal mood and behavior   Assessment/Plan  Acute respiratory failure (HCC) Acute Respiratory Failure requiring BiPAP/ Hospitalization Plan: We will prescribe your brovana nebs, and Albuterol nebs. It is ok to try and transition back to your Symbicort and  Spiriva 2 puffs twice a day as you did before admission only if you  as you are less short of breath with exertion. Rinse mouth after use If you notice you are worsening, resort back to the nebulizer treatments. We will schedule you for Pulmonary Function Tests. Note your daily symptoms > remember "red flags" for COPD:  Increase in cough, increase in sputum production, increase in shortness of breath or activity tolerance. If you notice these symptoms, please call to be seen.   Follow up after PFT's with MD or NP. Please contact office  for sooner follow up if symptoms do not improve or worsen or seek emergency care    Smoker Quit 07/23/2017 after hospitalization  Plan: Counseled x 3-4 minutes on importance of abstinence    Bevelyn Ngo, NP 09/11/2017  9:45 AM

## 2017-09-11 NOTE — Patient Instructions (Addendum)
It is nice to meet you today. We will prescribe your brovana nebs, and Albuterol nebs. It is ok to try and transition back to your Symbicort and Spiriva 2 puffs twice a day as you did before admission only if you  as you are less short of breath with exertion. Rinse mouth after use If you notice you are worsening, resort back to the nebulizer treatments. We will schedule you for Pulmonary Function Tests. Note your daily symptoms > remember "red flags" for COPD:  Increase in cough, increase in sputum production, increase in shortness of breath or activity tolerance. If you notice these symptoms, please call to be seen.   Follow up after PFT's with MD or NP. Please contact office for sooner follow up if symptoms do not improve or worsen or seek emergency care

## 2017-09-11 NOTE — Assessment & Plan Note (Signed)
Quit 07/23/2017 after hospitalization  Plan: Counseled x 3-4 minutes on importance of abstinence

## 2017-09-11 NOTE — Assessment & Plan Note (Signed)
Acute Respiratory Failure requiring BiPAP/ Hospitalization Plan: We will prescribe your brovana nebs, and Albuterol nebs. It is ok to try and transition back to your Symbicort and Spiriva 2 puffs twice a day as you did before admission only if you  as you are less short of breath with exertion. Rinse mouth after use If you notice you are worsening, resort back to the nebulizer treatments. We will schedule you for Pulmonary Function Tests. Note your daily symptoms > remember "red flags" for COPD:  Increase in cough, increase in sputum production, increase in shortness of breath or activity tolerance. If you notice these symptoms, please call to be seen.   Follow up after PFT's with MD or NP. Please contact office for sooner follow up if symptoms do not improve or worsen or seek emergency care

## 2017-10-02 ENCOUNTER — Ambulatory Visit (INDEPENDENT_AMBULATORY_CARE_PROVIDER_SITE_OTHER): Payer: Medicare Other | Admitting: Pulmonary Disease

## 2017-10-02 ENCOUNTER — Encounter: Payer: Self-pay | Admitting: Pulmonary Disease

## 2017-10-02 VITALS — BP 122/76 | HR 95 | Ht 61.0 in | Wt 138.0 lb

## 2017-10-02 DIAGNOSIS — J441 Chronic obstructive pulmonary disease with (acute) exacerbation: Secondary | ICD-10-CM

## 2017-10-02 DIAGNOSIS — J449 Chronic obstructive pulmonary disease, unspecified: Secondary | ICD-10-CM | POA: Diagnosis not present

## 2017-10-02 LAB — PULMONARY FUNCTION TEST
DL/VA % PRED: 50 %
DL/VA: 2.22 ml/min/mmHg/L
DLCO UNC % PRED: 33 %
DLCO cor % pred: 34 %
DLCO cor: 6.99 ml/min/mmHg
DLCO unc: 6.7 ml/min/mmHg
FEF 25-75 PRE: 0.24 L/s
FEF 25-75 Post: 0.39 L/sec
FEF2575-%CHANGE-POST: 59 %
FEF2575-%Pred-Post: 21 %
FEF2575-%Pred-Pre: 13 %
FEV1-%Change-Post: 30 %
FEV1-%PRED-PRE: 27 %
FEV1-%Pred-Post: 36 %
FEV1-Post: 0.73 L
FEV1-Pre: 0.56 L
FEV1FVC-%CHANGE-POST: 7 %
FEV1FVC-%Pred-Pre: 55 %
FEV6-%CHANGE-POST: 22 %
FEV6-%PRED-POST: 61 %
FEV6-%PRED-PRE: 50 %
FEV6-PRE: 1.28 L
FEV6-Post: 1.57 L
FEV6FVC-%CHANGE-POST: 0 %
FEV6FVC-%PRED-POST: 100 %
FEV6FVC-%PRED-PRE: 100 %
FVC-%Change-Post: 22 %
FVC-%PRED-POST: 60 %
FVC-%Pred-Pre: 49 %
FVC-Post: 1.62 L
FVC-Pre: 1.33 L
POST FEV6/FVC RATIO: 97 %
Post FEV1/FVC ratio: 45 %
Pre FEV1/FVC ratio: 42 %
Pre FEV6/FVC Ratio: 96 %
RV % PRED: 215 %
RV: 4.35 L
TLC % PRED: 129 %
TLC: 5.97 L

## 2017-10-02 NOTE — Progress Notes (Signed)
Katie DingwallVelma L Waller    [id number]    [birth year]  Primary Care Physician:Reade, Molly Maduroobert, MD  Referring Physician: Elias Elseeade, Robert, MD [phone] [address] [place], [place] [id number]  Chief complaint: Follow-up for COPD GOLD D  HPI: 72 year old with severe COPD [CAT Score 16].  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  She is placed on BiPAP, treated with steroids, antibiotics, nebulizer.  Also noted to have acute on chronic diastolic heart failure which was treated with IV Lasix.  Discharged to rehab facility.  She is since returned to home and continues with exercise regimen at home with home health.  Continues on Brovana and Pulmicort inhalers.  She would like to get back on her inhaler regimen as she does not like the nebulizers.  Has chronic dyspnea on exertion, cough with sputum production.  No fevers, chills, hemoptysis  CT imaging noted for severe coronary atherosclerosis.  She follows up with Dr Rennis GoldenHilty, cardiologist.  Imaging also noted for liver cyst which is likely benign.  She knows to get follow-up imaging ordered by her primary care in 6-12 months.  Pets: Dogs.  No cats, birds, farm animals Occupation: Retired as an Print production planneroffice manager.   Exposures: No known exposures, no mold, hot tub. Smoking history: 55-pack-year smoking.  Quit in November 2018 Travel History: Not significant  Outpatient Encounter Medications as of 10/02/2017  Medication Sig  . albuterol (PROVENTIL HFA;VENTOLIN HFA) 108 (90 BASE) MCG/ACT inhaler Inhale 2 puffs into the lungs every 6 (six) hours as needed for wheezing or shortness of breath.  Marland Kitchen. arformoterol (BROVANA) 15 MCG/2ML NEBU Take 2 mLs (15 mcg total) by nebulization 2 (two) times daily.  Marland Kitchen. aspirin 81 MG chewable tablet Chew 1 tablet (81 mg total) by mouth daily.  . budesonide (PULMICORT) 0.5 MG/2ML nebulizer solution Take 2 mLs (0.5 mg total) by nebulization 2 (two) times daily.  . furosemide  (LASIX) 40 MG tablet Take 40 mg daily by mouth.  Marland Kitchen. ipratropium-albuterol (DUONEB) 0.5-2.5 (3) MG/3ML SOLN Take 3 mLs by nebulization 3 (three) times daily. DX: J44.1  . LORazepam (ATIVAN) 1 MG tablet Take 1 tablet (1 mg total) by mouth every 6 (six) hours as needed for anxiety.  . raloxifene (EVISTA) 60 MG tablet Take 60 mg daily by mouth.  . traZODone (DESYREL) 100 MG tablet Take 1 tablet (100 mg total) by mouth at bedtime as needed for sleep.   No facility-administered encounter medications on file as of 10/02/2017.     Allergies as of 10/02/2017 - Review Complete 10/02/2017  Allergen Reaction Noted  . Ace inhibitors Swelling 06/11/2016  . Codeine Nausea And Vomiting 09/16/2013    Past Medical History:  Diagnosis Date  . Asthma   . Diabetes (HCC)   . Heart disease   . Hypertension     Past Surgical History:  Procedure Laterality Date  . KNEE SURGERY Left     Family History  Problem Relation Age of Onset  . Hyperlipidemia Mother   . Hypertension Mother   . Hypertension Father   . Hyperlipidemia Father     Social History   Socioeconomic History  . Marital status: Divorced    Spouse name: Not on file  . Number of children: Not on file  . Years of education: Not on file  . Highest education level: Not on file  Social Needs  . Financial resource strain: Not on file  . Food insecurity - worry:  Not on file  . Food insecurity - inability: Not on file  . Transportation needs - medical: Not on file  . Transportation needs - non-medical: Not on file  Occupational History  . Not on file  Tobacco Use  . Smoking status: Former Smoker    Packs/day: 1.00    Years: 55.00    Pack years: 55.00    Types: Cigarettes    Start date: 07/23/2017    Last attempt to quit: 07/23/2017    Years since quitting: 0.1  . Smokeless tobacco: Never Used  Substance and Sexual Activity  . Alcohol use: No  . Drug use: No  . Sexual activity: No  Other Topics Concern  . Not on file  Social  History Narrative  . Not on file   Review of systems: Review of Systems  Constitutional: Negative for fever and chills.  HENT: Negative.   Eyes: Negative for blurred vision.  Respiratory: as per HPI  Cardiovascular: Negative for chest pain and palpitations.  Gastrointestinal: Negative for vomiting, diarrhea, blood per rectum. Genitourinary: Negative for dysuria, urgency, frequency and hematuria.  Musculoskeletal: Negative for myalgias, back pain and joint pain.  Skin: Negative for itching and rash.  Neurological: Negative for dizziness, tremors, focal weakness, seizures and loss of consciousness.  Endo/Heme/Allergies: Negative for environmental allergies.  Psychiatric/Behavioral: Negative for depression, suicidal ideas and hallucinations.  All other systems reviewed and are negative.  Physical Exam: Blood pressure 122/76, pulse 95, height 5\' 1"  (1.549 m), weight 138 lb (62.6 kg), SpO2 99 %. Gen:      No acute distress HEENT:  EOMI, sclera anicteric Neck:     No masses; no thyromegaly Lungs:    Clear to auscultation bilaterally; normal respiratory effort CV:         Regular rate and rhythm; no murmurs Abd:      + bowel sounds; soft, non-tender; no palpable masses, no distension Ext:    No edema; adequate peripheral perfusion Skin:      Warm and dry; no rash Neuro: alert and oriented x 3 Psych: normal mood and affect  Data Reviewed: PFTs 10/02/17 FVC 1.6 [60%], FEV1 0.73 [36%], F/F 45, TLC 129%, 3/0 369%, DLCO 33% Severe obstruction, bronchodilator response, hyperinflation with air trapping, severe diffusion impairment.  CT chest 07/31/17-no pulmonary embolism, emphysematous changes, dependent atelectasis.  Low-density lesion in the liver.  I have reviewed the images personally. Liver 07/25/17-Small Cystic Areas within the Left hepatic lobe compatible with benign cyst.  Follow-up imaging recommended in 6-12 months.  Assessment:  COPD GOLD D She is making a slow recovery from  her recent hospitalization.  She is currently on Brovana and Pulmicort but would like to stop the nebulizer We will put her on trelegy inhaler.  She will continue on albuterol inhaler and DuoNeb's as needed for rescue medication Exerted in the office today with desats noted.  Continue supplemental oxygen. Continue home exercise program  Plan/Recommendations: - Stop Brovana, Pulmicort.  Start trelegy - Continue albuterol inhaler, duonebs as needed - Supplemental oxygen, exercise regimen.  Chilton Greathouse MD [facility] Pager [phone] 10/02/2017, 10:52 AM  CC: Elias Else, MD

## 2017-10-02 NOTE — Patient Instructions (Signed)
We will stop the Brovana and Pulmicort.  We will start you on trelegy inhaler Continue using the albuterol inhaler and duo nebs as needed for rescue medication Congratulations on not starting smoking again Continue the exercise regimen at home We will continue on supplemental oxygen at 2 L  Follow-up with your heart doctor regarding the plaque formation in the heart and also your primary care regarding the liver cysts

## 2017-10-02 NOTE — Progress Notes (Signed)
PFT completed today 10/02/17  

## 2017-11-03 ENCOUNTER — Telehealth: Payer: Self-pay | Admitting: Pulmonary Disease

## 2017-11-03 MED ORDER — FLUTICASONE-UMECLIDIN-VILANT 100-62.5-25 MCG/INH IN AEPB: 1.0000 | INHALATION_SPRAY | Freq: Every day | RESPIRATORY_TRACT | 3 refills | Status: DC

## 2017-11-03 NOTE — Telephone Encounter (Signed)
Spoke with pt and advised rx sent to pharmacy. Nothing further is needed.   

## 2018-01-01 ENCOUNTER — Encounter: Payer: Self-pay | Admitting: Pulmonary Disease

## 2018-01-01 ENCOUNTER — Ambulatory Visit (INDEPENDENT_AMBULATORY_CARE_PROVIDER_SITE_OTHER): Payer: Medicare Other | Admitting: Pulmonary Disease

## 2018-01-01 VITALS — BP 132/78 | HR 86 | Ht 61.0 in | Wt 146.6 lb

## 2018-01-01 DIAGNOSIS — J449 Chronic obstructive pulmonary disease, unspecified: Secondary | ICD-10-CM | POA: Diagnosis not present

## 2018-01-01 NOTE — Progress Notes (Signed)
Katie Waller    [id number]    1946-05-13  Primary Care Physician:Reade, Molly Maduro, MD  Referring Physician: Elias Else, MD [phone] [address] [place], [place] [address]  Chief complaint: Follow-up for COPD GOLD D  HPI: 72 year old with severe COPD [CAT Score 13].  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  She is placed on BiPAP, treated with steroids, antibiotics, nebulizer.  Also noted to have acute on chronic diastolic heart failure which was treated with IV Lasix.  Discharged to rehab facility.  She is since returned to home and continues with exercise regimen at home with home health.  Continues on Brovana and Pulmicort inhalers.  She would like to get back on her inhaler regimen as she does not like the nebulizers.  Has chronic dyspnea on exertion, cough with sputum production.  No fevers, chills, hemoptysis  CT imaging noted for severe coronary atherosclerosis.  She follows up with Dr Rennis Golden, cardiologist.  Imaging also noted for liver cyst which is likely benign.  She knows to get follow-up imaging ordered by her primary care in 6-12 months.  Pets: Dogs.  No cats, birds, farm animals Occupation: Retired as an Print production planner.   Exposures: No known exposures, no mold, hot tub. Smoking history: 55-pack-year smoking.  Quit in November 2018 Travel History: Not significant  Interim History: At last visit Brovana and Pulmicort was stopped and she was started on trelegy She is using this every day without any issues but does not like the taste of the inhaler She has hardly needed to use her albuterol rescue inhaler Continue with home physical therapy Dyspnea on exertion is stable.  Denies any cough, sputum production, fevers, chills.  Outpatient Encounter Medications as of 01/01/2018  Medication Sig  . albuterol (PROVENTIL HFA;VENTOLIN HFA) 108 (90 BASE) MCG/ACT inhaler Inhale 2 puffs into the lungs every 6 (six)  hours as needed for wheezing or shortness of breath.  Marland Kitchen arformoterol (BROVANA) 15 MCG/2ML NEBU Take 2 mLs (15 mcg total) by nebulization 2 (two) times daily. (Patient taking differently: Take 15 mcg by nebulization 2 (two) times daily as needed. )  . aspirin 81 MG chewable tablet Chew 1 tablet (81 mg total) by mouth daily.  . budesonide (PULMICORT) 0.5 MG/2ML nebulizer solution Take 2 mLs (0.5 mg total) by nebulization 2 (two) times daily. (Patient taking differently: Take 0.5 mg by nebulization 2 (two) times daily as needed. )  . Calcium 600-200 MG-UNIT tablet Take 1 tablet by mouth daily.  . Fluticasone-Umeclidin-Vilant (TRELEGY ELLIPTA) 100-62.5-25 MCG/INH AEPB Inhale 1 puff into the lungs daily.  . furosemide (LASIX) 40 MG tablet Take 40 mg daily by mouth.  Marland Kitchen ipratropium-albuterol (DUONEB) 0.5-2.5 (3) MG/3ML SOLN Take 3 mLs by nebulization 3 (three) times daily. DX: J44.1  . LORazepam (ATIVAN) 1 MG tablet Take 1 tablet (1 mg total) by mouth every 6 (six) hours as needed for anxiety.  . Multiple Vitamin (MULTIVITAMIN) tablet Take 1 tablet by mouth daily.  . raloxifene (EVISTA) 60 MG tablet Take 60 mg daily by mouth.  . traZODone (DESYREL) 100 MG tablet Take 1 tablet (100 mg total) by mouth at bedtime as needed for sleep.   No facility-administered encounter medications on file as of 01/01/2018.     Allergies as of 01/01/2018 - Review Complete 01/01/2018  Allergen Reaction Noted  . Ace inhibitors Swelling 06/11/2016  . Codeine Nausea And Vomiting 09/16/2013    Past Medical  History:  Diagnosis Date  . Asthma   . Diabetes (HCC)   . Heart disease   . Hypertension     Past Surgical History:  Procedure Laterality Date  . KNEE SURGERY Left     Family History  Problem Relation Age of Onset  . Hyperlipidemia Mother   . Hypertension Mother   . Hypertension Father   . Hyperlipidemia Father     Social History   Socioeconomic History  . Marital status: Divorced    Spouse name: Not  on file  . Number of children: Not on file  . Years of education: Not on file  . Highest education level: Not on file  Occupational History  . Not on file  Social Needs  . Financial resource strain: Not on file  . Food insecurity:    Worry: Not on file    Inability: Not on file  . Transportation needs:    Medical: Not on file    Non-medical: Not on file  Tobacco Use  . Smoking status: Former Smoker    Packs/day: 1.00    Years: 55.00    Pack years: 55.00    Types: Cigarettes    Start date: 07/23/2017    Last attempt to quit: 07/23/2017    Years since quitting: 0.4  . Smokeless tobacco: Never Used  Substance and Sexual Activity  . Alcohol use: No  . Drug use: No  . Sexual activity: Never  Lifestyle  . Physical activity:    Days per week: Not on file    Minutes per session: Not on file  . Stress: Not on file  Relationships  . Social connections:    Talks on phone: Not on file    Gets together: Not on file    Attends religious service: Not on file    Active member of club or organization: Not on file    Attends meetings of clubs or organizations: Not on file    Relationship status: Not on file  . Intimate partner violence:    Fear of current or ex partner: Not on file    Emotionally abused: Not on file    Physically abused: Not on file    Forced sexual activity: Not on file  Other Topics Concern  . Not on file  Social History Narrative  . Not on file   Review of systems: Review of Systems  Constitutional: Negative for fever and chills.  HENT: Negative.   Eyes: Negative for blurred vision.  Respiratory: as per HPI  Cardiovascular: Negative for chest pain and palpitations.  Gastrointestinal: Negative for vomiting, diarrhea, blood per rectum. Genitourinary: Negative for dysuria, urgency, frequency and hematuria.  Musculoskeletal: Negative for myalgias, back pain and joint pain.  Skin: Negative for itching and rash.  Neurological: Negative for dizziness, tremors,  focal weakness, seizures and loss of consciousness.  Endo/Heme/Allergies: Negative for environmental allergies.  Psychiatric/Behavioral: Negative for depression, suicidal ideas and hallucinations.  All other systems reviewed and are negative.  Physical Exam: Blood pressure 122/76, pulse 95, height  (1.549 m), weight 138 lb (62.6 kg), SpO2 99 %. Gen:      No acute distress HEENT:  EOMI, sclera anicteric Neck:     No masses; no thyromegaly Lungs:    Clear to auscultation bilaterally; normal respiratory effort CV:         Regular rate and rhythm; no murmurs Abd:      + bowel sounds; soft, non-tender; no palpable masses, no distension Ext:  No edema; adequate peripheral perfusion Skin:      Warm and dry; no rash Neuro: alert and oriented x 3 Psych: normal mood and affect  Data Reviewed: PFTs 10/02/17 FVC 1.6 [60%], FEV1 0.73 [36%], F/F 45, TLC 129%, 3/0 369%, DLCO 33% Severe obstruction, bronchodilator response, hyperinflation with air trapping, severe diffusion impairment.  CT chest 07/31/17-no pulmonary embolism, emphysematous changes, dependent atelectasis.  Low-density lesion in the liver. Chest x-ray 08/03/2017-hyperinflation, no acute cardiopulmonary changes. I have reviewed the images personally  Liver 07/25/17-Small Cystic Areas within the Left hepatic lobe compatible with benign cyst.  Follow-up imaging recommended in 6-12 months.   Echocardiogram 07/25/2017 LVEF 65-70%, grade 1 diastolic dysfunction.  No pulmonary hypertension.  Assessment:  COPD GOLD D Blood pressure 132/78, pulse 86, height  (1.549 m), weight 146 lb 9.6 oz (66.5 kg), SpO2 96 %. Gen:      No acute distress HEENT:  EOMI, sclera anicteric Neck:     No masses; no thyromegaly Lungs:    Clear to auscultation bilaterally; normal respiratory effort CV:         Regular rate and rhythm; no murmurs Abd:      + bowel sounds; soft, non-tender; no palpable masses, no distension Ext:    No edema; adequate  peripheral perfusion Skin:      Warm and dry; no rash Neuro: alert and oriented x 3 Psych: normal mood and affect  Assessment:  COPD Gold D Stable on trelegy.  Continue the same regimen Continue supplemental oxygen.  Checks oxygen levels on exertion today and prescribe portable concentrator Advised her to continue her exercise regimen  Health maintenance States that she received the pneumonia vaccine at her primary care 2 years ago. 06/19/2017-influenza vaccine  Plan/Recommendations: - Continue trelegy - Supplemental oxygen, exercise regimen - Qualify for portable concentrator.  Chilton Greathouse MD [facility] 01/01/2018, 9:10 AM  CC: Elias Else, MD

## 2018-01-01 NOTE — Patient Instructions (Signed)
Continue on your inhalers Continue to work on exercise and staying in shape We will check your oxygen levels and see if you qualify for portable concentrator Return to clinic in 6 months.

## 2018-01-01 NOTE — Addendum Note (Signed)
Addended by: Maxwell Marion A on: 01/01/2018 09:47 AM   Modules accepted: Orders

## 2018-03-24 ENCOUNTER — Other Ambulatory Visit: Payer: Self-pay | Admitting: Pulmonary Disease

## 2018-07-06 ENCOUNTER — Ambulatory Visit (INDEPENDENT_AMBULATORY_CARE_PROVIDER_SITE_OTHER): Payer: Medicare Other | Admitting: Pulmonary Disease

## 2018-07-06 ENCOUNTER — Encounter: Payer: Self-pay | Admitting: Pulmonary Disease

## 2018-07-06 ENCOUNTER — Other Ambulatory Visit (INDEPENDENT_AMBULATORY_CARE_PROVIDER_SITE_OTHER): Payer: Medicare Other

## 2018-07-06 VITALS — BP 128/80 | HR 111 | Ht 61.0 in | Wt 150.4 lb

## 2018-07-06 DIAGNOSIS — Z87891 Personal history of nicotine dependence: Secondary | ICD-10-CM | POA: Diagnosis not present

## 2018-07-06 DIAGNOSIS — J449 Chronic obstructive pulmonary disease, unspecified: Secondary | ICD-10-CM

## 2018-07-06 LAB — CBC WITH DIFFERENTIAL/PLATELET
BASOS ABS: 0.1 10*3/uL (ref 0.0–0.1)
Basophils Relative: 0.7 % (ref 0.0–3.0)
Eosinophils Absolute: 0.1 10*3/uL (ref 0.0–0.7)
Eosinophils Relative: 1.1 % (ref 0.0–5.0)
HEMATOCRIT: 37.9 % (ref 36.0–46.0)
HEMOGLOBIN: 12.2 g/dL (ref 12.0–15.0)
LYMPHS PCT: 33.2 % (ref 12.0–46.0)
Lymphs Abs: 3 10*3/uL (ref 0.7–4.0)
MCHC: 32.2 g/dL (ref 30.0–36.0)
MCV: 89.9 fl (ref 78.0–100.0)
MONOS PCT: 8.2 % (ref 3.0–12.0)
Monocytes Absolute: 0.7 10*3/uL (ref 0.1–1.0)
Neutro Abs: 5.1 10*3/uL (ref 1.4–7.7)
Neutrophils Relative %: 56.8 % (ref 43.0–77.0)
Platelets: 412 10*3/uL — ABNORMAL HIGH (ref 150.0–400.0)
RBC: 4.21 Mil/uL (ref 3.87–5.11)
RDW: 13.4 % (ref 11.5–15.5)
WBC: 9 10*3/uL (ref 4.0–10.5)

## 2018-07-06 MED ORDER — BUDESONIDE-FORMOTEROL FUMARATE 160-4.5 MCG/ACT IN AERO: 2.0000 | INHALATION_SPRAY | Freq: Two times a day (BID) | RESPIRATORY_TRACT | 4 refills | Status: DC

## 2018-07-06 MED ORDER — TIOTROPIUM BROMIDE MONOHYDRATE 2.5 MCG/ACT IN AERS: 2.0000 | INHALATION_SPRAY | Freq: Every day | RESPIRATORY_TRACT | 4 refills | Status: DC

## 2018-07-06 NOTE — Patient Instructions (Signed)
I am sorry the trelegy is not working as well for you We will switch her back to Symbicort 160/4.5 and Spiriva We will check some blood test today including CBC differential, alpha-1 antitrypsin levels and phenotype, IgE  Follow-up in 6 months with nurse practitioner.

## 2018-07-06 NOTE — Progress Notes (Signed)
Patient ID: Katie Waller, female   DOB: 04/24/1946, 72 y.o.   MRN: [id number]   Inhaler training given. In-check peak flow #:55

## 2018-07-06 NOTE — Progress Notes (Addendum)
Katie Waller    [id number]    10-Mar-1946  Primary Care Physician:Reade, Molly Maduro, MD  Referring Physician: Elias Else, MD [phone] [place], [place] [address]  Chief complaint: Follow-up for COPD GOLD D  HPI: 72 year old with severe COPD [CAT Score 13].   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  She is placed on BiPAP, treated with steroids, antibiotics, nebulizer.  Also noted to have acute on chronic diastolic heart failure which was treated with IV Lasix.  Discharged to rehab facility.  She is since returned to home and continues with exercise regimen at home with home health.    Did not like Brovana and Pulmicort since she does not like a mask on the face So she was changed to Trelegy in march 2019  Pets: Dogs.  No cats, birds, farm animals Occupation: Retired as an Print production planner.   Exposures: No known exposures, no mold, hot tub. Smoking history: 55-pack-year smoking.  Quit in November 2018 Travel History: Not significant  Interim History: Feels the trelegy is not working for her as she feels is not getting the medication She wants to go back to Symbicort and Spiriva.  Dyspnea on exertion is stable.  Denies any cough, sputum production, fevers, chills.  Outpatient Encounter Medications as of 07/06/2018  Medication Sig  . albuterol (PROVENTIL HFA;VENTOLIN HFA) 108 (90 BASE) MCG/ACT inhaler Inhale 2 puffs into the lungs every 6 (six) hours as needed for wheezing or shortness of breath.  Marland Kitchen aspirin 81 MG chewable tablet Chew 1 tablet (81 mg total) by mouth daily.  . Calcium 600-200 MG-UNIT tablet Take 1 tablet by mouth daily.  . furosemide (LASIX) 40 MG tablet Take 40 mg daily by mouth.  Marland Kitchen ipratropium-albuterol (DUONEB) 0.5-2.5 (3) MG/3ML SOLN Take 3 mLs by nebulization 3 (three) times daily. DX: J44.1 (Patient taking differently: Take 3 mLs by nebulization as needed. DX: J44.1)  . LORazepam  (ATIVAN) 1 MG tablet Take 1 tablet (1 mg total) by mouth every 6 (six) hours as needed for anxiety.  . Multiple Vitamin (MULTIVITAMIN) tablet Take 1 tablet by mouth daily.  . raloxifene (EVISTA) 60 MG tablet Take 60 mg daily by mouth.  . traZODone (DESYREL) 100 MG tablet Take 1 tablet (100 mg total) by mouth at bedtime as needed for sleep.  . TRELEGY ELLIPTA 100-62.5-25 MCG/INH AEPB TAKE 1 PUFF BY MOUTH EVERY DAY   No facility-administered encounter medications on file as of 07/06/2018.    Physical Exam: Blood pressure 128/80, pulse (!) 111, height 5\' 1"  (1.549 m), weight 150 lb 6.4 oz (68.2 kg), SpO2 96 %. Gen:      No acute distress HEENT:  EOMI, sclera anicteric Neck:     No masses; no thyromegaly Lungs:    Diminished air entry, no wheeze or crackles. CV:         Regular rate and rhythm; no murmurs Abd:      + bowel sounds; soft, non-tender; no palpable masses, no distension Ext:    No edema; adequate peripheral perfusion Skin:      Warm and dry; no rash Neuro: alert and oriented x 3 Psych: normal mood and affect  Data Reviewed: Imaging CT chest 07/31/17-no pulmonary embolism, emphysematous changes, dependent atelectasis.  Low-density lesion in the liver. Chest x-ray 08/03/2017-hyperinflation, no acute cardiopulmonary changes. I have reviewed the images personally  Liver 08/02/17-Small Cystic Areas within the Left hepatic lobe compatible with  benign cyst.  Follow-up imaging recommended in 6-12 months.   PFTs 10/02/17 FVC 1.6 [60%], FEV1 0.73 [36%], F/F 45, TLC 129%, 3/0 369%, DLCO 33% Severe obstruction, bronchodilator response, hyperinflation with air trapping, severe diffusion impairment.  Cardiac Echocardiogram 07/25/2017 LVEF 65-70%, grade 1 diastolic dysfunction.  No pulmonary hypertension.  Assessment:  COPD Gold D Does not like trelegy Change to Symbicort 160/4.5 and Spiriva. Continue supplemental oxygen via portable concentrator. Check CBC differential, alpha-1  antitrypsin levels and phenotype, IgE  Former smoker Refer for low-dose screening CTs of the chest.  Health maintenance 06/19/2017-influenza vaccine.  Plans on getting vaccination this year at her primary care 2/90/15- Prevnar 12/08/2014-Pneumovax  Plan/Recommendations: - Stop trelegy.  Start Symbicort, Spiriva - Supplemental oxygen, exercise regimen - Low-dose screening CT  Chilton Greathouse MD [facility] 07/06/2018, 2:03 PM  CC: Elias Else, MD

## 2018-07-09 ENCOUNTER — Other Ambulatory Visit: Payer: Self-pay | Admitting: Acute Care

## 2018-07-09 DIAGNOSIS — Z122 Encounter for screening for malignant neoplasm of respiratory organs: Secondary | ICD-10-CM

## 2018-07-09 DIAGNOSIS — Z87891 Personal history of nicotine dependence: Secondary | ICD-10-CM

## 2018-07-10 ENCOUNTER — Other Ambulatory Visit: Payer: Self-pay | Admitting: Family Medicine

## 2018-07-10 DIAGNOSIS — M810 Age-related osteoporosis without current pathological fracture: Secondary | ICD-10-CM

## 2018-07-10 DIAGNOSIS — E2839 Other primary ovarian failure: Secondary | ICD-10-CM

## 2018-07-11 ENCOUNTER — Other Ambulatory Visit: Payer: Self-pay | Admitting: Family Medicine

## 2018-07-11 DIAGNOSIS — Z1231 Encounter for screening mammogram for malignant neoplasm of breast: Secondary | ICD-10-CM

## 2018-07-11 DIAGNOSIS — R160 Hepatomegaly, not elsewhere classified: Secondary | ICD-10-CM

## 2018-07-11 LAB — ALPHA-1 ANTITRYPSIN PHENOTYPE: A-1 Antitrypsin, Ser: 178 mg/dL (ref 83–199)

## 2018-07-11 LAB — IGE: IGE (IMMUNOGLOBULIN E), SERUM: 133 kU/L — AB (ref <=114)

## 2018-07-17 ENCOUNTER — Other Ambulatory Visit: Payer: Medicare Other

## 2018-07-20 ENCOUNTER — Other Ambulatory Visit: Payer: Medicare Other

## 2018-07-23 ENCOUNTER — Ambulatory Visit
Admission: RE | Admit: 2018-07-23 | Discharge: 2018-07-23 | Disposition: A | Discharge status: Home / Self Care | Payer: Medicare Other | Source: Ambulatory Visit | Attending: Family Medicine | Admitting: Family Medicine

## 2018-07-23 DIAGNOSIS — R160 Hepatomegaly, not elsewhere classified: Secondary | ICD-10-CM

## 2018-07-25 ENCOUNTER — Inpatient Hospital Stay: Admission: RE | Admit: 2018-07-25 | Payer: Medicare Other | Source: Ambulatory Visit

## 2018-07-25 ENCOUNTER — Encounter: Payer: Medicare Other | Admitting: Acute Care

## 2018-08-07 ENCOUNTER — Other Ambulatory Visit: Payer: Medicare Other

## 2018-08-07 ENCOUNTER — Ambulatory Visit
Admission: RE | Admit: 2018-08-07 | Discharge: 2018-08-07 | Disposition: A | Discharge status: Home / Self Care | Payer: Medicare Other | Source: Ambulatory Visit | Attending: Family Medicine | Admitting: Family Medicine

## 2018-08-07 DIAGNOSIS — Z1231 Encounter for screening mammogram for malignant neoplasm of breast: Secondary | ICD-10-CM

## 2018-08-07 DIAGNOSIS — M810 Age-related osteoporosis without current pathological fracture: Secondary | ICD-10-CM

## 2018-08-07 DIAGNOSIS — E2839 Other primary ovarian failure: Secondary | ICD-10-CM

## 2018-08-13 ENCOUNTER — Encounter: Payer: Medicare Other | Admitting: Acute Care

## 2018-08-13 ENCOUNTER — Inpatient Hospital Stay: Admission: RE | Admit: 2018-08-13 | Payer: Medicare Other | Source: Ambulatory Visit

## 2018-08-24 ENCOUNTER — Ambulatory Visit: Payer: Medicare Other

## 2018-09-03 ENCOUNTER — Ambulatory Visit (INDEPENDENT_AMBULATORY_CARE_PROVIDER_SITE_OTHER): Payer: Medicare Other | Admitting: Acute Care

## 2018-09-03 ENCOUNTER — Encounter: Payer: Self-pay | Admitting: Acute Care

## 2018-09-03 ENCOUNTER — Ambulatory Visit (INDEPENDENT_AMBULATORY_CARE_PROVIDER_SITE_OTHER)
Admission: RE | Admit: 2018-09-03 | Discharge: 2018-09-03 | Disposition: A | Discharge status: Home / Self Care | Payer: Medicare Other | Source: Ambulatory Visit | Attending: Acute Care | Admitting: Acute Care

## 2018-09-03 VITALS — BP 164/80 | HR 106 | Ht 61.0 in | Wt 151.0 lb

## 2018-09-03 DIAGNOSIS — Z87891 Personal history of nicotine dependence: Secondary | ICD-10-CM

## 2018-09-03 DIAGNOSIS — Z122 Encounter for screening for malignant neoplasm of respiratory organs: Secondary | ICD-10-CM

## 2018-09-03 NOTE — Progress Notes (Signed)
Shared Decision Making Visit Lung Cancer Screening Program [phone]([id number])   Eligibility:  Age 72 y.o.  Pack Years Smoking History Calculation 54 pack year smoking history (# packs/per year x # years smoked)  Recent History of coughing up blood  no  Unexplained weight loss? no ( >Than 15 pounds within the last 6 months )  Prior History Lung / other cancer no (Diagnosis within the last 5 years already requiring surveillance chest CT Scans).  Smoking Status Former Smoker  Former Smokers: Years since quit: 2018  Quit Date: 07/2017  Visit Components:  Discussion included one or more decision making aids. yes  Discussion included risk/benefits of screening. yes  Discussion included potential follow up diagnostic testing for abnormal scans. yes  Discussion included meaning and risk of over diagnosis. yes  Discussion included meaning and risk of False Positives. yes  Discussion included meaning of total radiation exposure. yes  Counseling Included:  Importance of adherence to annual lung cancer LDCT screening. yes  Impact of comorbidities on ability to participate in the program. yes  Ability and willingness to under diagnostic treatment. yes  Smoking Cessation Counseling:  Current Smokers:   Discussed importance of smoking cessation. yes  Information about tobacco cessation classes and interventions provided to patient. yes  Patient provided with "ticket" for LDCT Scan. yes  Symptomatic Patient. no  Counseling  Diagnosis Code: Tobacco Use Z72.0  Asymptomatic Patient yes  Counseling (Intermediate counseling: > three minutes counseling) [id number]  Former Smokers:   Discussed the importance of maintaining cigarette abstinence. yes  Diagnosis Code: Personal History of Nicotine Dependence. U98.119Z87.891  Information about tobacco cessation classes and interventions provided to patient. Yes  Patient provided with "ticket" for LDCT Scan. yes  Written Order for Lung Cancer  Screening with LDCT placed in Epic. Yes (CT Chest Lung Cancer Screening Low Dose W/O CM) [id number] Z12.2-Screening of respiratory organs Z87.891-Personal history of nicotine dependence  I spent 25 minutes of face to face time with Ms. Rosezetta SchlatterBurnette discussing the risks and benefits of lung cancer screening. We viewed a power point together that explained in detail the above noted topics. We took the time to pause the power point at intervals to allow for questions to be asked and answered to ensure understanding. We discussed that she had taken the single most powerful action possible to decrease her risk of developing lung cancer when she quit smoking. I counseled her to remain smoke free, and to contact me if she ever had the desire to smoke again so that I can provide resources and tools to help support the effort to remain smoke free. We discussed the time and location of the scan, and that either  Abigail Miyamotoenise Phelps RN or I will call with the results within  24-48 hours of receiving them. She has my card and contact information in the event she needs to speak with me, in addition to a copy of the power point we reviewed as a resource. She verbalized understanding of all of the above and had no further questions upon leaving the office.     I explained to the patient that there has been a high incidence of coronary artery disease noted on these exams. I explained that this is a non-gated exam therefore degree or severity cannot be determined. This patient is not on statin therapy. I have asked the patient to follow-up with their PCP regarding any incidental finding of coronary artery disease and management with diet or medication as they feel is clinically indicated.  The patient verbalized understanding of the above and had no further questions.       Bevelyn NgoSarah F Groce, NP 09/03/2018 3:28 PM

## 2018-09-07 ENCOUNTER — Telehealth: Payer: Self-pay | Admitting: Acute Care

## 2018-09-07 DIAGNOSIS — Z122 Encounter for screening for malignant neoplasm of respiratory organs: Secondary | ICD-10-CM

## 2018-09-07 DIAGNOSIS — Z87891 Personal history of nicotine dependence: Secondary | ICD-10-CM

## 2018-09-07 IMAGING — DX DG CHEST 1V PORT
2 series · 2 of 2 positions shown · non-contrast
Comparison: 07/27/2017, 07/24/2017 and 01/13/2017

CLINICAL DATA: Acute on chronic respiratory failure with
hypercapnia.

EXAM:
PORTABLE CHEST 1 VIEW

[chest ap (1 of 2)]
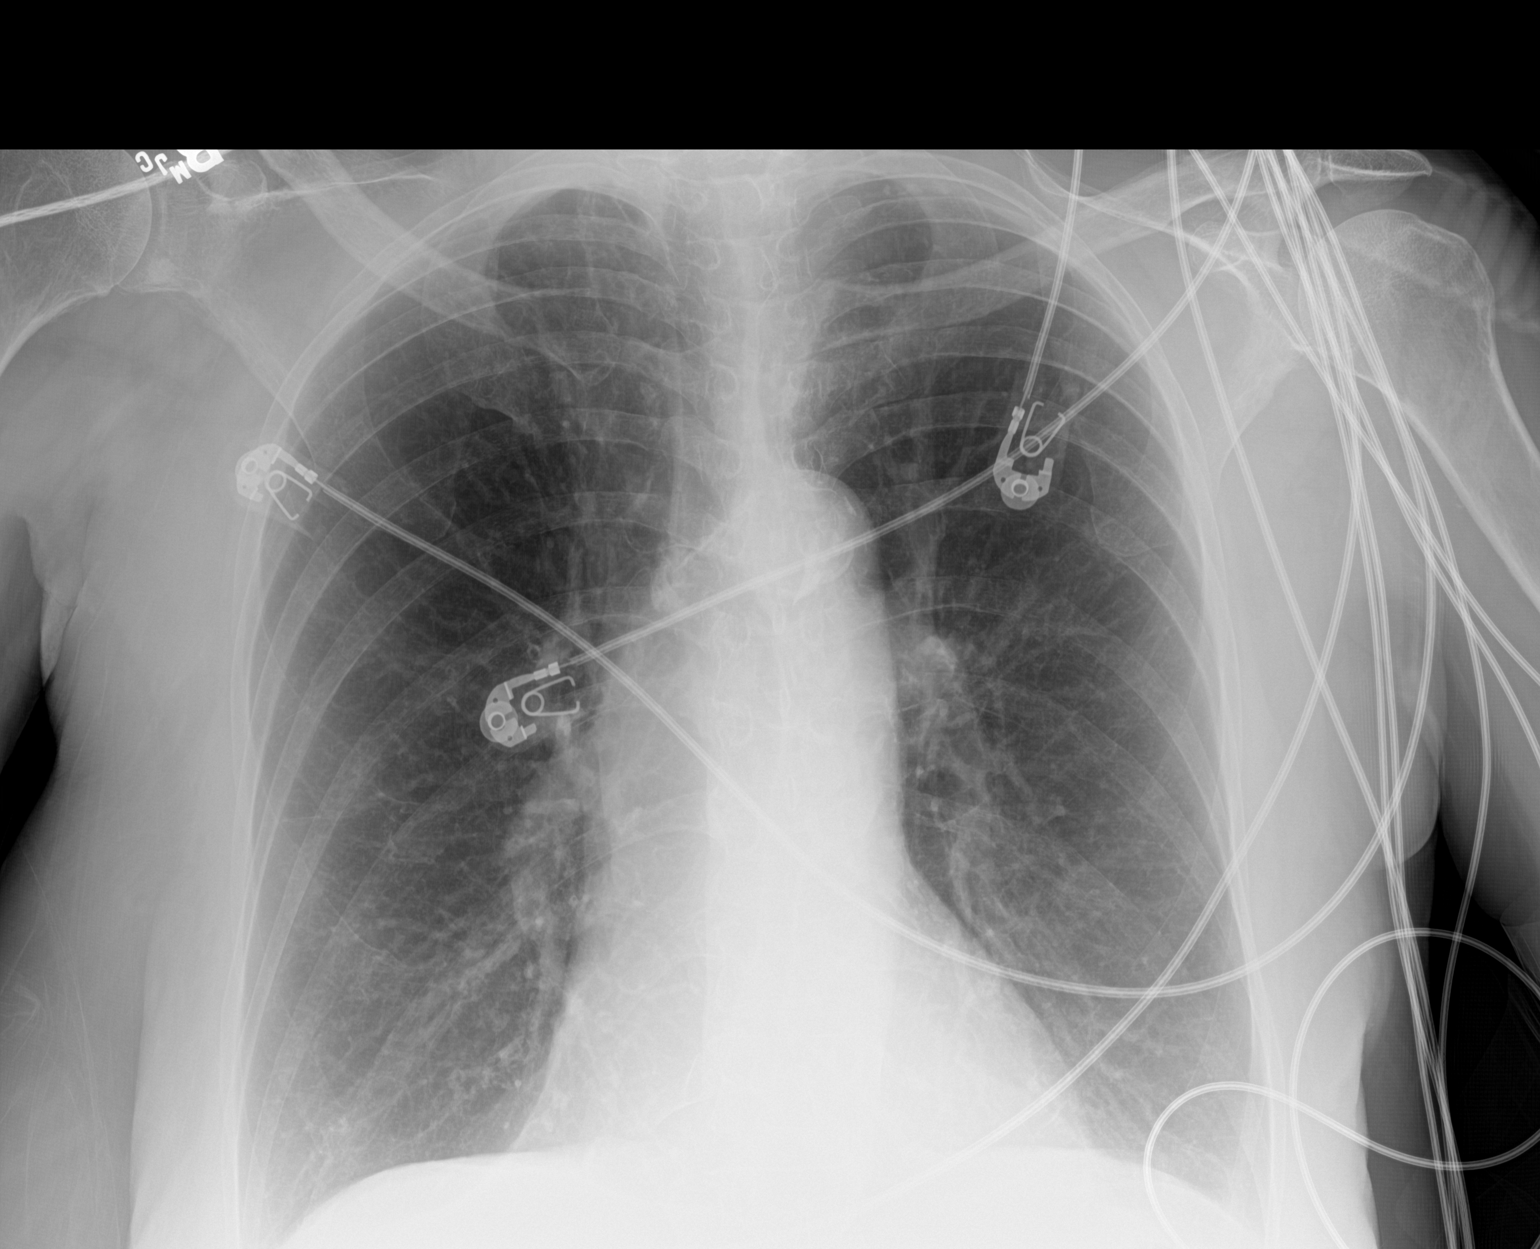

[chest ap (2 of 2)]
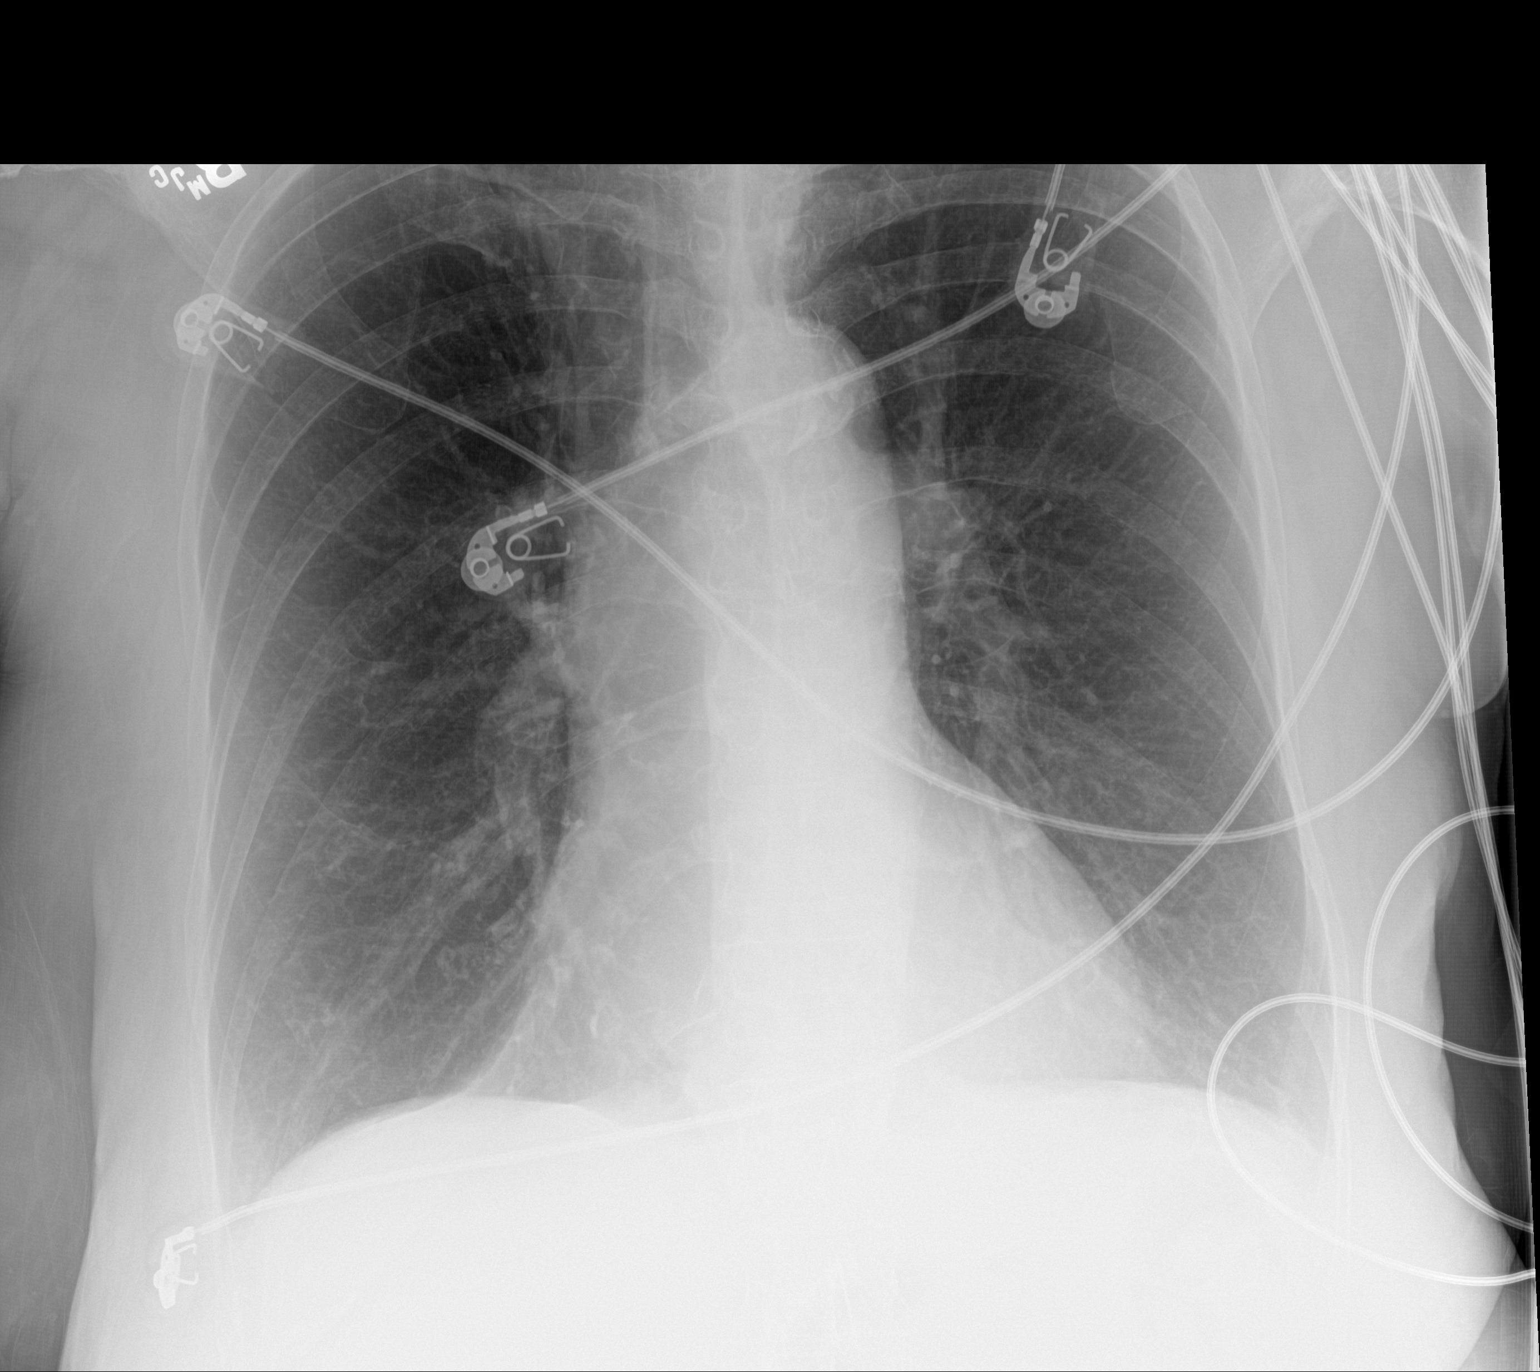

[2 of 2 positions shown; findings below may reference images not displayed]

FINDINGS: The heart size and pulmonary vascularity are normal. Calcification
in the thoracic aorta. The lungs are hyperinflated but clear. No
effusions.
IMPRESSION: No acute abnormality.  Hyperinflated lungs.

Aortic Atherosclerosis ([id number].D).

## 2018-09-07 NOTE — Telephone Encounter (Signed)
Pt is returning a call to Abigail Miyamoto, RN for her lung cancer screening CT. Will route message to [place].

## 2018-09-07 NOTE — Telephone Encounter (Signed)
Pt informed of CT results per Sarah Groce, NP.  PT verbalized understanding.  Copy sent to PCP.  Order placed for 1 yr f/u CT.  

## 2018-09-11 IMAGING — DX DG CHEST 1V PORT
1 series · 1 of 1 positions shown · non-contrast
Comparison: 07/30/2017

CLINICAL DATA: Respiratory failure

EXAM:
PORTABLE CHEST 1 VIEW

[chest ap]
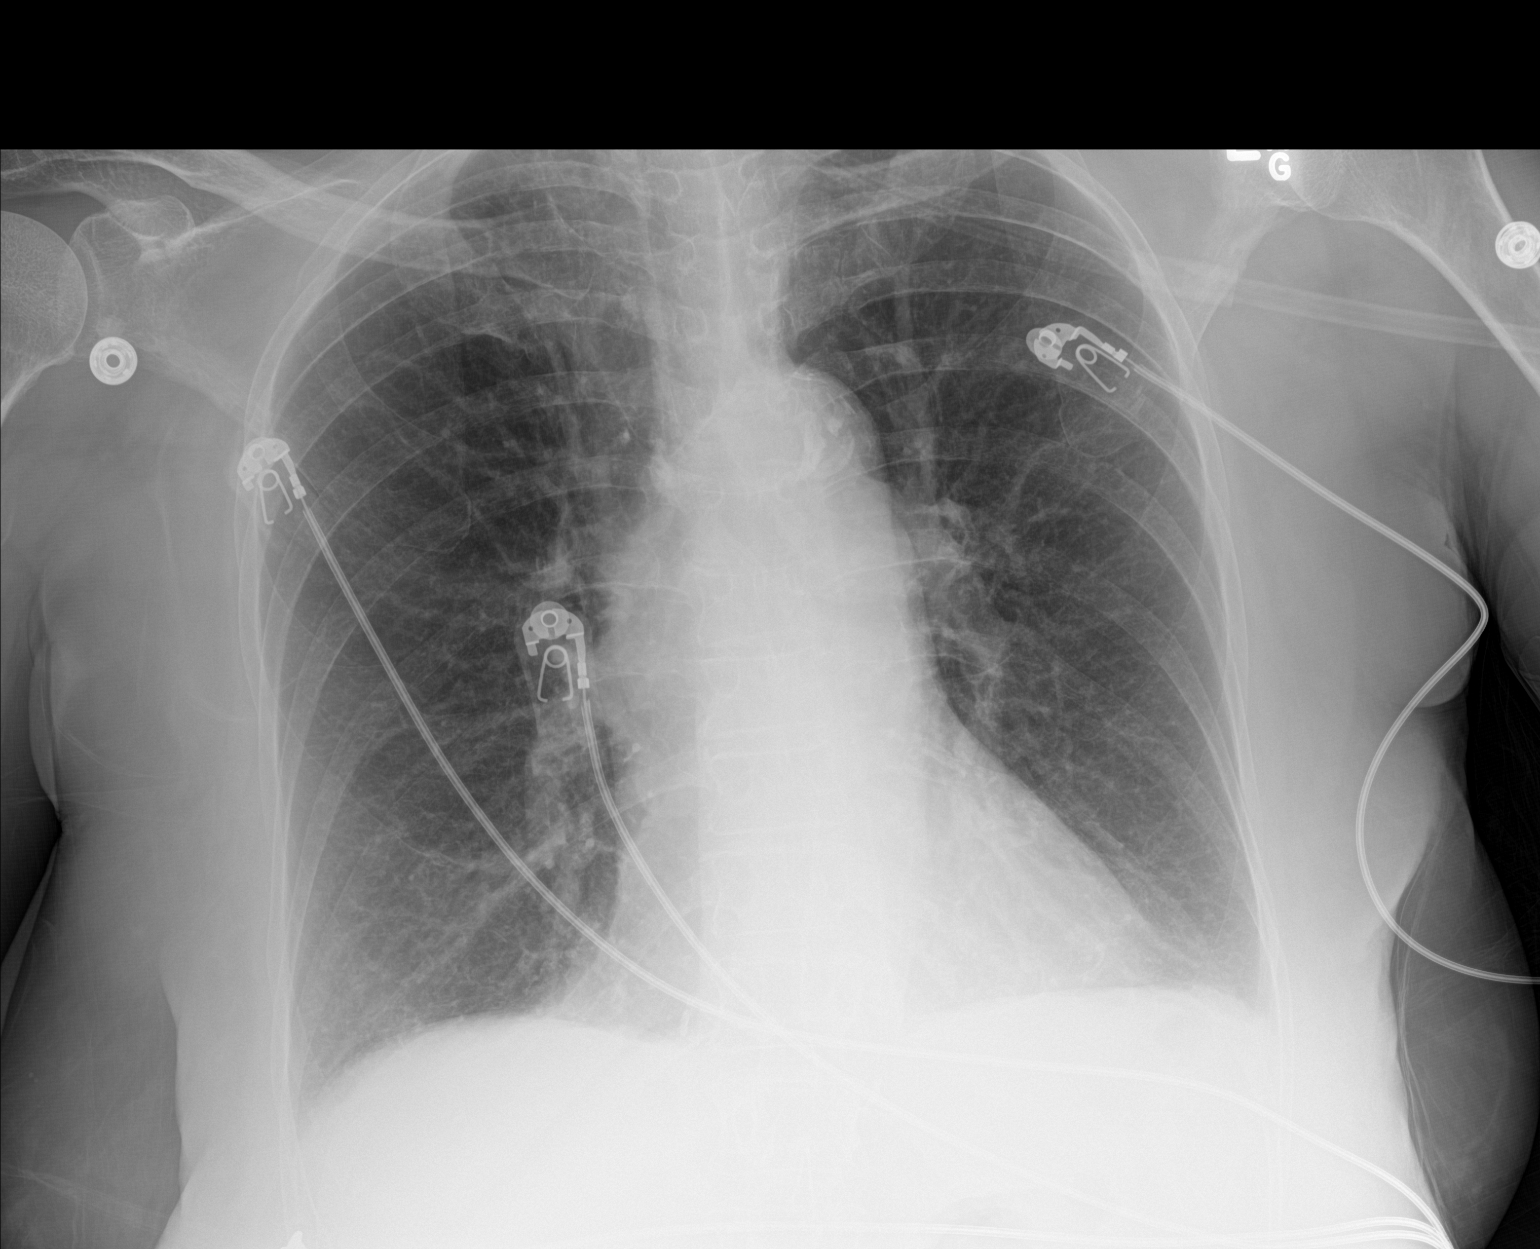

[1 of 1 positions shown; findings below may reference images not displayed]

FINDINGS: Heart size and vascularity normal. Lungs remain clear without
infiltrate or effusion. Atherosclerotic aorta.
IMPRESSION: No active disease.

## 2018-11-05 ENCOUNTER — Ambulatory Visit: Payer: Medicare Other | Admitting: Cardiology

## 2018-11-14 ENCOUNTER — Ambulatory Visit (INDEPENDENT_AMBULATORY_CARE_PROVIDER_SITE_OTHER): Payer: Medicare Other | Admitting: Cardiology

## 2018-11-14 ENCOUNTER — Other Ambulatory Visit: Payer: Self-pay

## 2018-11-14 ENCOUNTER — Encounter: Payer: Self-pay | Admitting: Cardiology

## 2018-11-14 VITALS — BP 144/80 | HR 100 | Ht 61.0 in | Wt 147.8 lb

## 2018-11-14 DIAGNOSIS — I878 Other specified disorders of veins: Secondary | ICD-10-CM | POA: Diagnosis not present

## 2018-11-14 DIAGNOSIS — I5032 Chronic diastolic (congestive) heart failure: Secondary | ICD-10-CM

## 2018-11-14 DIAGNOSIS — J449 Chronic obstructive pulmonary disease, unspecified: Secondary | ICD-10-CM

## 2018-11-14 DIAGNOSIS — I10 Essential (primary) hypertension: Secondary | ICD-10-CM | POA: Diagnosis not present

## 2018-11-14 NOTE — Patient Instructions (Signed)
Medication Instructions:  Your physician recommends that you continue on your current medications as directed. Please refer to the Current Medication list given to you today.  If you need a refill on your cardiac medications before your next appointment, please call your pharmacy.   Lab work: None.  If you have labs (blood work) drawn today and your tests are completely normal, you will receive your results only by: Marland Kitchen MyChart Message (if you have MyChart) OR . A paper copy in the mail If you have any lab test that is abnormal or we need to change your treatment, we will call you to review the results.  Testing/Procedures: Your physician has requested that you have an echocardiogram. Echocardiography is a painless test that uses sound waves to create images of your heart. It provides your doctor with information about the size and shape of your heart and how well your heart's chambers and valves are working. This procedure takes approximately one hour. There are no restrictions for this procedure.  Your physician has requested that you have a lower extremity arterial duplex. During this test, exercise and ultrasound are used to evaluate arterial blood flow in the legs. Allow one hour for this exam. There are no restrictions or special instructions.    Follow-Up: At [facility], you and your health needs are our priority.  As part of our continuing mission to provide you with exceptional heart care, we have created designated Provider Care Teams.  These Care Teams include your primary Cardiologist (physician) and Advanced Practice Providers (APPs -  Physician Assistants and Nurse Practitioners) who all work together to provide you with the care you need, when you need it. You will need a follow up appointment in 6 months.  Please call our office 2 months in advance to schedule this appointment.  You may see No primary care provider on file. or another member of our [employer] Provider Team  in [place]: Norman Herrlich, MD . Belva Crome, MD  Any Other Special Instructions Will Be Listed Below (If Applicable).   Echocardiogram An echocardiogram is a procedure that uses painless sound waves (ultrasound) to produce an image of the heart. Images from an echocardiogram can provide important information about:  Signs of coronary artery disease (CAD).  Aneurysm detection. An aneurysm is a weak or damaged part of an artery wall that bulges out from the normal force of blood pumping through the body.  Heart size and shape. Changes in the size or shape of the heart can be associated with certain conditions, including heart failure, aneurysm, and CAD.  Heart muscle function.  Heart valve function.  Signs of a past heart attack.  Fluid buildup around the heart.  Thickening of the heart muscle.  A tumor or infectious growth around the heart valves. Tell a health care provider about:  Any allergies you have.  All medicines you are taking, including vitamins, herbs, eye drops, creams, and over-the-counter medicines.  Any blood disorders you have.  Any surgeries you have had.  Any medical conditions you have.  Whether you are pregnant or may be pregnant. What are the risks? Generally, this is a safe procedure. However, problems may occur, including:  Allergic reaction to dye (contrast) that may be used during the procedure. What happens before the procedure? No specific preparation is needed. You may eat and drink normally. What happens during the procedure?   An IV tube may be inserted into one of your veins.  You may receive contrast  through this tube. A contrast is an injection that improves the quality of the pictures from your heart.  A gel will be applied to your chest.  A wand-like tool (transducer) will be moved over your chest. The gel will help to transmit the sound waves from the transducer.  The sound waves will harmlessly bounce off of your heart  to allow the heart images to be captured in real-time motion. The images will be recorded on a computer. The procedure may vary among health care providers and hospitals. What happens after the procedure?  You may return to your normal, everyday life, including diet, activities, and medicines, unless your health care provider tells you not to do that. Summary  An echocardiogram is a procedure that uses painless sound waves (ultrasound) to produce an image of the heart.  Images from an echocardiogram can provide important information about the size and shape of your heart, heart muscle function, heart valve function, and fluid buildup around your heart.  You do not need to do anything to prepare before this procedure. You may eat and drink normally.  After the echocardiogram is completed, you may return to your normal, everyday life, unless your health care provider tells you not to do that. This information is not intended to replace advice given to you by your health care provider. Make sure you discuss any questions you have with your health care provider. Document Released: 08/19/2000 Document Revised: 09/24/2016 Document Reviewed: 09/24/2016 Elsevier Interactive Patient Education  2019 [employer].

## 2018-11-14 NOTE — Progress Notes (Signed)
Cardiology Office Note:    Date:  11/14/2018   ID:  Katie Waller, DOB 1945-12-14, MRN [id number]  PCP:  Elias Else, MD  Cardiologist:  Gypsy Balsam, MD    Referring MD: Elias Else, MD   Chief Complaint  Patient presents with  . Follow-up  Doing well  History of Present Illness:    Katie Waller is a 73 y.o. female who is a patient of Dr. Donnie Aho who comes to our office for regular follow-up.  Overall she is doing well does have shortness of breath use oxygen all the time short of breath very easily but from swelling of lower extremities point review she takes furosemide on as-needed basis she also check her weight on a regular basis and seems to be steady does have some cough sometimes sputum production but overall seems to be doing quite well.  Has having any additional cardiac complaints.  Past Medical History:  Diagnosis Date  . Asthma   . Diabetes (HCC)   . Heart disease   . Hypertension     Past Surgical History:  Procedure Laterality Date  . KNEE SURGERY Left     Current Medications: Current Meds  Medication Sig  . albuterol (PROVENTIL HFA;VENTOLIN HFA) 108 (90 BASE) MCG/ACT inhaler Inhale 2 puffs into the lungs every 6 (six) hours as needed for wheezing or shortness of breath.  Marland Kitchen aspirin 81 MG chewable tablet Chew 1 tablet (81 mg total) by mouth daily.  . benzonatate (TESSALON) 100 MG capsule Take 100 mg by mouth as needed for cough.  . budesonide-formoterol (SYMBICORT) 160-4.5 MCG/ACT inhaler Inhale 2 puffs into the lungs 2 (two) times daily.  . Calcium 600-200 MG-UNIT tablet Take 1 tablet by mouth daily.  . furosemide (LASIX) 40 MG tablet Take 40 mg daily by mouth.  Marland Kitchen ipratropium-albuterol (DUONEB) 0.5-2.5 (3) MG/3ML SOLN Take 3 mLs by nebulization 3 (three) times daily. DX: J44.1 (Patient taking differently: Take 3 mLs by nebulization as needed. DX: J44.1)  . LORazepam (ATIVAN) 1 MG tablet Take 1 tablet (1 mg total) by mouth every 6 (six) hours as  needed for anxiety.  . Multiple Vitamin (MULTIVITAMIN) tablet Take 1 tablet by mouth daily.  Marland Kitchen tiotropium (SPIRIVA) 18 MCG inhalation capsule Place 18 mcg into inhaler and inhale daily.  . traZODone (DESYREL) 100 MG tablet Take 1 tablet (100 mg total) by mouth at bedtime as needed for sleep.  . [DISCONTINUED] raloxifene (EVISTA) 60 MG tablet Take 60 mg daily by mouth.     Allergies:   Ace inhibitors and Codeine   Social History   Socioeconomic History  . Marital status: Divorced    Spouse name: Not on file  . Number of children: Not on file  . Years of education: Not on file  . Highest education level: Not on file  Occupational History  . Not on file  Social Needs  . Financial resource strain: Not on file  . Food insecurity:    Worry: Not on file    Inability: Not on file  . Transportation needs:    Medical: Not on file    Non-medical: Not on file  Tobacco Use  . Smoking status: Former Smoker    Packs/day: 1.00    Years: 55.00    Pack years: 55.00    Types: Cigarettes    Start date: 07/23/2017    Last attempt to quit: 07/23/2017    Years since quitting: 1.3  . Smokeless tobacco: Never Used  Substance and Sexual  Activity  . Alcohol use: No  . Drug use: No  . Sexual activity: Never  Lifestyle  . Physical activity:    Days per week: Not on file    Minutes per session: Not on file  . Stress: Not on file  Relationships  . Social connections:    Talks on phone: Not on file    Gets together: Not on file    Attends religious service: Not on file    Active member of club or organization: Not on file    Attends meetings of clubs or organizations: Not on file    Relationship status: Not on file  Other Topics Concern  . Not on file  Social History Narrative  . Not on file     Family History: The patient's family history includes Hyperlipidemia in her father and mother; Hypertension in her father and mother. ROS:   Please see the history of present illness.    All  14 point review of systems negative except as described per history of present illness  EKGs/Labs/Other Studies Reviewed:      Recent Labs: 07/06/2018: Hemoglobin 12.2; Platelets 412.0  Recent Lipid Panel    Component Value Date/Time   CHOL 169 09/18/2013 0614   TRIG 134 09/18/2013 0614   HDL 76 09/18/2013 0614   CHOLHDL 2.2 09/18/2013 0614   VLDL 27 09/18/2013 0614   LDLCALC 66 09/18/2013 0614    Physical Exam:    VS:  BP (!) 144/80   Pulse 100   Ht 5\' 1"  (1.549 m)   Wt 147 lb 12.8 oz (67 kg)   SpO2 98% Comment: 2 lt of O2  BMI 27.93 kg/m     Wt Readings from Last 3 Encounters:  11/14/18 147 lb 12.8 oz (67 kg)  09/03/18 151 lb (68.5 kg)  07/06/18 150 lb 6.4 oz (68.2 kg)     GEN:  Well nourished, well developed in no acute distress HEENT: Normal NECK: No JVD; No carotid bruits LYMPHATICS: No lymphadenopathy CARDIAC: RRR, no murmurs, no rubs, no gallops RESPIRATORY:  Clear to auscultation without rales, wheezing or rhonchi  ABDOMEN: Soft, non-tender, non-distended MUSCULOSKELETAL:  No edema; No deformity  SKIN: Warm and dry LOWER EXTREMITIES: no swelling NEUROLOGIC:  Alert and oriented x 3 PSYCHIATRIC:  Normal affect   ASSESSMENT:    1. Chronic diastolic CHF (congestive heart failure) (HCC)   2. Essential hypertension   3. COPD, group D, by GOLD 2017 classification (HCC)   4. Venous stasis of both lower extremities    PLAN:    In order of problems listed above:  1. Chronic diastolic congestive heart failure seems to be compensated I will ask him to repeat echocardiogram to check left ventricular ejection fraction.  I also will be interested in the right ventricle size and function. 2. Essential hypertension blood pressure appears to be well controlled continue present management. 3. COPD doing well from that point of view.  Follow-up by pulmonary team. 4. Tonic venous stasis I be interested in checking her pulmonary pressure and I worry that she may have  pulmonary hypertension which probably will be related to COPD. 5. Pain in lower extremities she noticed that she does have pain especially at night however she also complained of having some pain while walking concern is about potentially having peripheral vascular disease I do feel some still tibial pulses but does a week we will do arterial duplex of lower extremities   Medication Adjustments/Labs and Tests Ordered: Current medicines  are reviewed at length with the patient today.  Concerns regarding medicines are outlined above.  No orders of the defined types were placed in this encounter.  Medication changes: No orders of the defined types were placed in this encounter.   Signed, Georgeanna Lea, MD, [facility] 11/14/2018 2:00 PM    [facility]

## 2018-11-22 ENCOUNTER — Other Ambulatory Visit: Payer: Self-pay

## 2018-11-22 ENCOUNTER — Ambulatory Visit (HOSPITAL_BASED_OUTPATIENT_CLINIC_OR_DEPARTMENT_OTHER)
Admission: RE | Admit: 2018-11-22 | Discharge: 2018-11-22 | Disposition: A | Discharge status: Home / Self Care | Payer: Medicare Other | Source: Ambulatory Visit | Attending: Cardiology | Admitting: Cardiology

## 2018-11-22 DIAGNOSIS — I10 Essential (primary) hypertension: Secondary | ICD-10-CM

## 2018-11-22 DIAGNOSIS — I5032 Chronic diastolic (congestive) heart failure: Secondary | ICD-10-CM

## 2018-11-22 DIAGNOSIS — I878 Other specified disorders of veins: Secondary | ICD-10-CM

## 2018-11-22 NOTE — Progress Notes (Signed)
  Echocardiogram 2D Echocardiogram has been performed.  Linetta Regner T Malayiah Mcbrayer 11/22/2018, 3:39 PM

## 2018-12-24 ENCOUNTER — Encounter (HOSPITAL_COMMUNITY): Payer: Self-pay | Admitting: Emergency Medicine

## 2018-12-24 ENCOUNTER — Emergency Department (HOSPITAL_COMMUNITY)
Admission: EM | Admit: 2018-12-24 | Discharge: 2018-12-25 | Disposition: A | Discharge status: Home / Self Care | Payer: Medicare Other | Attending: Emergency Medicine | Admitting: Emergency Medicine

## 2018-12-24 ENCOUNTER — Other Ambulatory Visit: Payer: Self-pay

## 2018-12-24 ENCOUNTER — Emergency Department (HOSPITAL_COMMUNITY): Payer: Medicare Other

## 2018-12-24 DIAGNOSIS — R030 Elevated blood-pressure reading, without diagnosis of hypertension: Secondary | ICD-10-CM

## 2018-12-24 DIAGNOSIS — R1031 Right lower quadrant pain: Secondary | ICD-10-CM | POA: Diagnosis present

## 2018-12-24 DIAGNOSIS — J449 Chronic obstructive pulmonary disease, unspecified: Secondary | ICD-10-CM | POA: Insufficient documentation

## 2018-12-24 DIAGNOSIS — Z7982 Long term (current) use of aspirin: Secondary | ICD-10-CM | POA: Insufficient documentation

## 2018-12-24 DIAGNOSIS — I110 Hypertensive heart disease with heart failure: Secondary | ICD-10-CM | POA: Insufficient documentation

## 2018-12-24 DIAGNOSIS — Z87891 Personal history of nicotine dependence: Secondary | ICD-10-CM | POA: Insufficient documentation

## 2018-12-24 DIAGNOSIS — Z79899 Other long term (current) drug therapy: Secondary | ICD-10-CM | POA: Diagnosis not present

## 2018-12-24 DIAGNOSIS — I5032 Chronic diastolic (congestive) heart failure: Secondary | ICD-10-CM | POA: Diagnosis not present

## 2018-12-24 DIAGNOSIS — N201 Calculus of ureter: Secondary | ICD-10-CM | POA: Diagnosis not present

## 2018-12-24 LAB — CBC WITH DIFFERENTIAL/PLATELET
Abs Immature Granulocytes: 0.03 10*3/uL (ref 0.00–0.07)
Basophils Absolute: 0.1 10*3/uL (ref 0.0–0.1)
Basophils Relative: 0 %
Eosinophils Absolute: 0.1 10*3/uL (ref 0.0–0.5)
Eosinophils Relative: 1 %
HCT: 38.2 % (ref 36.0–46.0)
Hemoglobin: 11.5 g/dL — ABNORMAL LOW (ref 12.0–15.0)
Immature Granulocytes: 0 %
Lymphocytes Relative: 15 %
Lymphs Abs: 1.7 10*3/uL (ref 0.7–4.0)
MCH: 27.5 pg (ref 26.0–34.0)
MCHC: 30.1 g/dL (ref 30.0–36.0)
MCV: 91.4 fL (ref 80.0–100.0)
Monocytes Absolute: 0.8 10*3/uL (ref 0.1–1.0)
Monocytes Relative: 7 %
Neutro Abs: 8.7 10*3/uL — ABNORMAL HIGH (ref 1.7–7.7)
Neutrophils Relative %: 77 %
Platelets: 418 10*3/uL — ABNORMAL HIGH (ref 150–400)
RBC: 4.18 MIL/uL (ref 3.87–5.11)
RDW: 13.9 % (ref 11.5–15.5)
WBC: 11.3 10*3/uL — ABNORMAL HIGH (ref 4.0–10.5)
nRBC: 0 % (ref 0.0–0.2)

## 2018-12-24 LAB — URINALYSIS, ROUTINE W REFLEX MICROSCOPIC
Bilirubin Urine: NEGATIVE
Glucose, UA: NEGATIVE mg/dL
Ketones, ur: 5 mg/dL — AB
Nitrite: NEGATIVE
Protein, ur: 100 mg/dL — AB
Specific Gravity, Urine: 1.032 — ABNORMAL HIGH (ref 1.005–1.030)
pH: 5 (ref 5.0–8.0)

## 2018-12-24 LAB — BASIC METABOLIC PANEL
Anion gap: 9 (ref 5–15)
BUN: 23 mg/dL (ref 8–23)
CO2: 29 mmol/L (ref 22–32)
Calcium: 9.3 mg/dL (ref 8.9–10.3)
Chloride: 102 mmol/L (ref 98–111)
Creatinine, Ser: 1.39 mg/dL — ABNORMAL HIGH (ref 0.44–1.00)
GFR calc Af Amer: 44 mL/min — ABNORMAL LOW (ref >60)
GFR calc non Af Amer: 38 mL/min — ABNORMAL LOW (ref >60)
Glucose, Bld: 137 mg/dL — ABNORMAL HIGH (ref 70–99)
Potassium: 3.9 mmol/L (ref 3.5–5.1)
Sodium: 140 mmol/L (ref 135–145)

## 2018-12-24 MED ORDER — SODIUM CHLORIDE 0.9 % IV BOLUS
500.0000 mL | Freq: Once | INTRAVENOUS | Status: AC
  Administered 2018-12-24: 500 mL via INTRAVENOUS

## 2018-12-24 MED ORDER — HYDROCODONE-ACETAMINOPHEN 5-325 MG PO TABS
1.0000 | ORAL_TABLET | Freq: Once | ORAL | Status: AC
  Administered 2018-12-24: 22:00:00 1 via ORAL
  Filled 2018-12-24: qty 1

## 2018-12-24 MED ORDER — FENTANYL CITRATE (PF) 100 MCG/2ML IJ SOLN
25.0000 ug | Freq: Once | INTRAMUSCULAR | Status: AC
  Administered 2018-12-24: 18:00:00 25 ug via INTRAVENOUS
  Filled 2018-12-24: qty 2

## 2018-12-24 MED ORDER — METOCLOPRAMIDE HCL 5 MG/ML IJ SOLN
5.0000 mg | Freq: Once | INTRAMUSCULAR | Status: AC
  Administered 2018-12-24: 5 mg via INTRAVENOUS
  Filled 2018-12-24: qty 2

## 2018-12-24 MED ORDER — HYDROCODONE-ACETAMINOPHEN 5-325 MG PO TABS: 1.0000 | ORAL_TABLET | Freq: Four times a day (QID) | ORAL | 0 refills | Status: DC | PRN

## 2018-12-24 MED ORDER — ONDANSETRON 4 MG PO TBDP: 4.0000 mg | ORAL_TABLET | Freq: Three times a day (TID) | ORAL | 0 refills | Status: DC | PRN

## 2018-12-24 MED ORDER — FENTANYL CITRATE (PF) 100 MCG/2ML IJ SOLN
25.0000 ug | Freq: Once | INTRAMUSCULAR | Status: AC
  Administered 2018-12-24: 25 ug via INTRAVENOUS
  Filled 2018-12-24: qty 2

## 2018-12-24 MED ORDER — ONDANSETRON HCL 4 MG/2ML IJ SOLN
4.0000 mg | Freq: Once | INTRAMUSCULAR | Status: AC
  Administered 2018-12-24: 4 mg via INTRAVENOUS
  Filled 2018-12-24: qty 2

## 2018-12-24 NOTE — ED Provider Notes (Signed)
[facility]-EMERGENCY DEPT Provider Note   CSN: [id number] Arrival date & time: 12/24/18  1744    History   Chief Complaint Chief Complaint  Patient presents with  . Flank Pain  . Urinary Frequency    HPI Katie Waller is a 73 y.o. female with a past medical history of COPD on oxygen via [place] at all times, CHF, hypertension presents to ED for 1 day history of right-sided flank pain radiating to her right lower quadrant with associated urinary frequency and dysuria.  She states that she woke up this morning with the symptoms.  Describes pain as sharp, intermittent.  She has tried ibuprofen with no improvement in her symptoms.  No history of similar symptoms in the past.  She cannot recall any inciting event that may have triggered the symptoms.  Denies any changes to bowel movements, fever, vomiting or shortness of breath.     HPI  Past Medical History:  Diagnosis Date  . Asthma   . Diabetes (HCC)   . Heart disease   . Hypertension     Patient Active Problem List   Diagnosis Date Noted  . Acute on chronic respiratory failure with hypercapnia (HCC)   . Palliative care encounter   . Goals of care, counseling/discussion   . Chronic diastolic CHF (congestive heart failure) (HCC) 07/28/2017  . Venous stasis of both lower extremities 06/13/2016  . Acute respiratory failure (HCC) 06/08/2016  . Abnormal finding on EKG 09/19/2013  . Prediabetes 09/19/2013  . Sinus tachycardia 09/18/2013  . Essential hypertension 09/18/2013  . Near syncope 09/17/2013  . COPD, group D, by GOLD 2017 classification (HCC) 09/17/2013  . Smoker 09/17/2013    Past Surgical History:  Procedure Laterality Date  . KNEE SURGERY Left      OB History   No obstetric history on file.      Home Medications    Prior to Admission medications   Medication Sig Start Date End Date Taking? Authorizing Provider  albuterol (PROVENTIL HFA;VENTOLIN HFA) 108 (90 BASE) MCG/ACT inhaler  Inhale 2 puffs into the lungs every 6 (six) hours as needed for wheezing or shortness of breath. 09/19/13  Yes Johnson, Clanford L, MD  aspirin 81 MG chewable tablet Chew 1 tablet (81 mg total) by mouth daily. Patient taking differently: Chew 81 mg by mouth at bedtime.  09/19/13  Yes Johnson, Clanford L, MD  benzonatate (TESSALON) 100 MG capsule Take 100 mg by mouth 3 (three) times daily as needed for cough.    Yes [provider]  budesonide-formoterol (SYMBICORT) 160-4.5 MCG/ACT inhaler Inhale 2 puffs into the lungs 2 (two) times daily. 07/06/18  Yes Mannam, Praveen, MD  Calcium 600-200 MG-UNIT tablet Take 1 tablet by mouth daily.   Yes [provider]  furosemide (LASIX) 40 MG tablet Take 40 mg by mouth daily as needed for fluid or edema.    Yes [provider]  ipratropium-albuterol (DUONEB) 0.5-2.5 (3) MG/3ML SOLN Take 3 mLs by nebulization 3 (three) times daily. DX: J44.1 Patient taking differently: Take 3 mLs by nebulization as needed. DX: J44.1 09/11/17  Yes Bevelyn Ngo, NP  LORazepam (ATIVAN) 1 MG tablet Take 1 tablet (1 mg total) by mouth every 6 (six) hours as needed for anxiety. 08/07/17  Yes Meredeth Ide, MD  Multiple Vitamin (MULTIVITAMIN) tablet Take 1 tablet by mouth daily.   Yes [provider]  tiotropium (SPIRIVA) 18 MCG inhalation capsule Place 18 mcg into inhaler and inhale daily.  Yes [provider]  traZODone (DESYREL) 100 MG tablet Take 1 tablet (100 mg total) by mouth at bedtime as needed for sleep. Patient taking differently: Take 100 mg by mouth at bedtime.  08/07/17  Yes Meredeth Ide, MD  HYDROcodone-acetaminophen (NORCO/VICODIN) 5-325 MG tablet Take 1 tablet by mouth every 6 (six) hours as needed. 12/24/18   Jacobus Colvin, PA-C  ondansetron (ZOFRAN ODT) 4 MG disintegrating tablet Take 1 tablet (4 mg total) by mouth every 8 (eight) hours as needed for nausea or vomiting. 12/24/18   Dietrich Pates, PA-C    Family History Family  History  Problem Relation Age of Onset  . Hyperlipidemia Mother   . Hypertension Mother   . Hypertension Father   . Hyperlipidemia Father     Social History Social History   Tobacco Use  . Smoking status: Former Smoker    Packs/day: 1.00    Years: 55.00    Pack years: 55.00    Types: Cigarettes    Start date: 07/23/2017    Last attempt to quit: 07/23/2017    Years since quitting: 1.4  . Smokeless tobacco: Never Used  Substance Use Topics  . Alcohol use: No  . Drug use: No     Allergies   Ace inhibitors and Codeine   Review of Systems Review of Systems  Constitutional: Negative for appetite change, chills and fever.  HENT: Negative for ear pain, rhinorrhea, sneezing and sore throat.   Eyes: Negative for photophobia and visual disturbance.  Respiratory: Negative for cough, chest tightness, shortness of breath and wheezing.   Cardiovascular: Negative for chest pain and palpitations.  Gastrointestinal: Positive for abdominal pain and nausea. Negative for blood in stool, constipation, diarrhea and vomiting.  Genitourinary: Positive for dysuria, flank pain and frequency. Negative for hematuria and urgency.  Musculoskeletal: Negative for myalgias.  Skin: Negative for rash.  Neurological: Negative for dizziness, weakness and light-headedness.     Physical Exam Updated Vital Signs BP (!) 194/99 (BP Location: Left Arm)   Pulse (!) 101   Temp 97.9 F (36.6 C) (Oral)   Resp 20   SpO2 100%   Physical Exam Vitals signs and nursing note reviewed.  Constitutional:      General: She is not in acute distress.    Appearance: She is well-developed.     Comments: Nontoxic appearing, speaking in complete sentences without difficulty.  HENT:     Head: Normocephalic and atraumatic.     Nose: Nose normal.  Eyes:     General: No scleral icterus.       Right eye: No discharge.        Left eye: No discharge.     Conjunctiva/sclera: Conjunctivae normal.  Neck:      Musculoskeletal: Normal range of motion and neck supple.  Cardiovascular:     Rate and Rhythm: Normal rate and regular rhythm.     Heart sounds: Normal heart sounds. No murmur. No friction rub. No gallop.   Pulmonary:     Effort: Pulmonary effort is normal. No respiratory distress.     Breath sounds: Normal breath sounds.  Abdominal:     General: Bowel sounds are normal. There is no distension.     Palpations: Abdomen is soft.     Tenderness: There is abdominal tenderness in the right lower quadrant. There is no guarding.    Musculoskeletal: Normal range of motion.  Skin:    General: Skin is warm and dry.     Findings: No rash.  Neurological:     Mental Status: She is alert.     Motor: No abnormal muscle tone.     Coordination: Coordination normal.      ED Treatments / Results  Labs (all labs ordered are listed, but only abnormal results are displayed) Labs Reviewed  URINALYSIS, ROUTINE W REFLEX MICROSCOPIC - Abnormal; Notable for the following components:      Result Value   APPearance HAZY (*)    Specific Gravity, Urine 1.032 (*)    Hgb urine dipstick SMALL (*)    Ketones, ur 5 (*)    Protein, ur 100 (*)    Leukocytes,Ua SMALL (*)    Bacteria, UA RARE (*)    All other components within normal limits  BASIC METABOLIC PANEL - Abnormal; Notable for the following components:   Glucose, Bld 137 (*)    Creatinine, Ser 1.39 (*)    GFR calc non Af Amer 38 (*)    GFR calc Af Amer 44 (*)    All other components within normal limits  CBC WITH DIFFERENTIAL/PLATELET - Abnormal; Notable for the following components:   WBC 11.3 (*)    Hemoglobin 11.5 (*)    Platelets 418 (*)    Neutro Abs 8.7 (*)    All other components within normal limits  URINE CULTURE    EKG None  Radiology Ct Renal Stone Study  Result Date: 12/24/2018 CLINICAL DATA:  Right flank pain EXAM: CT ABDOMEN AND PELVIS WITHOUT CONTRAST TECHNIQUE: Multidetector CT imaging of the abdomen and pelvis was  performed following the standard protocol without IV contrast. COMPARISON:  None. FINDINGS: Lower chest: Mild scarring in the lung bases. No infiltrate or effusion. Mild pericardial thickening. Hepatobiliary: Multiple subcentimeter hypodensities in the liver, too small to characterize. Small calcification in the liver adjacent to the gallbladder fossa. Negative for biliary dilatation. No gallbladder wall thickening. Pancreas: Negative Spleen: Negative Adrenals/Urinary Tract: Moderate right hydronephrosis with perinephric edema. Right ureter dilated down to the bladder. Obstructing 3 mm calculus at the right UVJ. Normal left kidney. Stomach/Bowel: Stomach is within normal limits. Appendix appears normal. No evidence of bowel wall thickening, distention, or inflammatory changes. Vascular/Lymphatic: Extensive atherosclerotic disease in the aorta and iliac arteries without aneurysm. No enlarged lymph nodes identified. Reproductive: Small calcified uterine fibroids. Overall uterus normal in size. No pelvic mass. Other: Negative for free fluid. Musculoskeletal: No acute skeletal abnormality. IMPRESSION: 3 mm obstructing stone distal right ureter at the UVJ. No other renal calculi Normal appendix Multiple small liver lesions, too small to characterize. Electronically Signed   By: Marlan Palau M.D.   On: 12/24/2018 20:13    Procedures Procedures (including critical care time)  Medications Ordered in ED Medications  metoCLOPramide (REGLAN) injection 5 mg (has no administration in time range)  fentaNYL (SUBLIMAZE) injection 25 mcg (25 mcg Intravenous Given 12/24/18 1828)  sodium chloride 0.9 % bolus 500 mL (0 mLs Intravenous Stopped 12/24/18 2025)  fentaNYL (SUBLIMAZE) injection 25 mcg (25 mcg Intravenous Given 12/24/18 2024)  ondansetron (ZOFRAN) injection 4 mg (4 mg Intravenous Given 12/24/18 2023)  HYDROcodone-acetaminophen (NORCO/VICODIN) 5-325 MG per tablet 1 tablet (1 tablet Oral Given 12/24/18 2203)      Initial Impression / Assessment and Plan / ED Course  I have reviewed the triage vital signs and the nursing notes.  Pertinent labs & imaging results that were available during my care of the patient were reviewed by me and considered in my medical decision making (see chart for details).  73 year old female with a pa70st medical history of COPD, CHF, hypertension presents to ED for 1 day history of right-sided flank pain radiating to right lower quadrant with associated urinary frequency and dysuria.  Her symptoms began this morning when she woke up.  No improvement with ibuprofen.  No history of similar symptoms in the past.  Denies history of kidney stones.  She denies any fevers.  On my exam there is tenderness palpation of the right flank and right lower quadrant without rebound or guarding.  Her vital signs are within normal limits, hypertensive which could be secondary to discomfort.  She has no respiratory symptoms.  Urinalysis shows ketonuria, small leukocytes, rare bacteria, CMP with mild elevation in creatinine to 1.3, leukocytosis of 11 on CBC.  CT renal stone study shows 3 mm obstructing stone at the right UVJ.  Will give fluids, pain medication, antiemetics and reassess.  Patient with improvement in her symptoms with pain medication.  However, was given Vicodin and began having nausea. Will continue to monitor after additional antiemetics. Anticipate dispo home if symptoms improve. Care handed off to oncoming provider.  Final Clinical Impressions(s) / ED Diagnoses   Final diagnoses:  Ureterolithiasis    ED Discharge Orders         Ordered    HYDROcodone-acetaminophen (NORCO/VICODIN) 5-325 MG tablet  Every 6 hours PRN     12/24/18 2222    ondansetron (ZOFRAN ODT) 4 MG disintegrating tablet  Every 8 hours PRN     12/24/18 2222            Portions of this note were generated with Dragon dictation software. Dictation errors may occur despite best attempts at  proofreading.    Dietrich PatesKhatri, Evamarie Raetz, PA-C 12/24/18 2224    Gwyneth SproutPlunkett, Whitney, MD 12/24/18 2236

## 2018-12-24 NOTE — ED Notes (Signed)
Patient ambulated to the restroom and back to stretcher. Patient became nausea while in the restroom. She vomited once she came to the room. Comfort measures given to patient. Hina, PA was present for her vomiting.

## 2018-12-24 NOTE — ED Notes (Signed)
Patient ambulated to restroom with assistance. Patient is requesting pain medication and nausea medication.

## 2018-12-24 NOTE — Discharge Instructions (Addendum)
Thank you for allowing me to care for you today in the Emergency Department.   You may take 650 mg of Tylenol once every 6 hours for mild to moderate pain. If you are unable to control your pain with Tylenol, you may take one tablet of Norco. Each tablet of Norco contains 325 mg of Tylenol.  Do not take more than 4000 mg of Tylenol from all sources in a 24-hour. Do not drive while taking Norco as it is an opioid and can be addicting.  You should also not take it with other substances that can make you drowsy or more sleepy.  Your urine is concerning for infection.  Take 1 tablet of Keflex 2 times daily for the next 5 days.  Your urine has been sent to the lab for a culture.  If you develop nausea or vomiting, let 1 tablet of Zofran does dissolve under your tongue every 8 hours as needed.  Use the urine strainer to catch the stone if you pass it.  If you do not pass the kidney stone within the next week, please follow-up with urology.  Their office information is listed above.  Please follow-up with your primary care provider to have your blood pressure checked within the next week as it was elevated in the ER today.  If your blood pressure remains elevated and you develop a severe headache, chest pain, if you stop making urine, or develop severe abdominal pain, you should return to the ER. You should also return to the ER if you develop uncontrollable vomiting, uncontrollable pain, or other new, concerning symptoms.

## 2018-12-24 NOTE — ED Provider Notes (Signed)
"Katie Waller is a 73 y.o. female with a past medical history of COPD on oxygen via [place] at all times, CHF, hypertension presents to ED for 1 day history of right-sided flank pain radiating to her right lower quadrant with associated urinary frequency and dysuria.  She states that she woke up this morning with the symptoms.  Describes pain as sharp, intermittent.  She has tried ibuprofen with no improvement in her symptoms.  No history of similar symptoms in the past.  She cannot recall any inciting event that may have triggered the symptoms.  Denies any changes to bowel movements, fever, vomiting or shortness of breath."  Patient reports that she was taking losartan for hypertension until she was hospitalized in 2018.  Losartan was discontinued at discharge after that hospitalization.  She currently does not take any antihypertensives.  Physical Exam  BP (!) 171/92 (BP Location: Left Arm)   Pulse 80   Temp (!) 97.5 F (36.4 C) (Oral)   Resp 18   SpO2 100%   Physical Exam Vitals signs and nursing note reviewed.  Constitutional:      General: She is not in acute distress.    Appearance: She is well-developed. She is not ill-appearing, toxic-appearing or diaphoretic.     Comments: Elderly, cachectic appearing female  HENT:     Head: Normocephalic and atraumatic.     Mouth/Throat:     Mouth: Mucous membranes are moist.  Neck:     Musculoskeletal: Normal range of motion.  Cardiovascular:     Rate and Rhythm: Normal rate.  Abdominal:     General: Abdomen is flat. There is no distension.     Palpations: There is no mass.     Tenderness: There is no abdominal tenderness. There is no right CVA tenderness, left CVA tenderness, guarding or rebound.     Hernia: No hernia is present.     Comments: Abdomen soft, nontender, nondistended.  No CVA tenderness bilaterally.  Musculoskeletal: Normal range of motion.  Neurological:     Mental Status: She is alert.       ED Course/Procedures      Procedures  MDM    73 year old female received a signout from [place] pending antiemetics and reevaluation.  Please see her note for further work-up and medical decision making.  Patient was found to have a 3 mm obstructing stone in the right UVJ on CT.  Creatinine was found to be elevated to 1.3.  Urine appears concerning for infection and urine culture has been sent.  She has a mild leukocytosis of 11 on CBC.  The patient was given Norco in the ER and began having vomiting.  Vomiting improved with Reglan.  Blood pressure was elevated to 190 over 90s, which is elevated from the patient's baseline.  The patient denies any signs or symptoms of hypertensive urgency or emergency.  She does report that pain is currently at a 6 out of 10, down from 10 out of 10 on arrival to the ER.  On my abdominal exam, she has no CVA tenderness or right lower quadrant tenderness.  We had a shared decision-making conversation, and the patient reports that she is ready for discharge as her daughter is waiting in the parking lot due to COVID-19 pandemic.  She has good follow-up with both primary care and pulmonology.  I have encouraged her to have her blood pressure rechecked within the next week.  I do not suspect that it is elevated secondary to CHF  exacerbation given the patient's presenting complaints today.  I suspect blood pressure is elevated secondary to pain. Will discharge home with pain control, antiemetic, and Keflex for UTI.  Blood pressure has improved to 170s over 90s without treatment in the ER.  She has been given a urine strainer and referral to urology. She is hemodynamically stable and in no acute distress.  Safe for discharge to home with outpatient follow-up.     Barkley BoardsMcDonald, Navdeep Fessenden A, PA-C 12/25/18 0040    Shon BatonHorton, Courtney F, MD 12/25/18 [phone]

## 2018-12-24 NOTE — ED Triage Notes (Signed)
Pt c/o right flank pain with urinary frequency that started today. Denies blood in urine

## 2018-12-25 LAB — URINE CULTURE

## 2018-12-25 MED ORDER — CEPHALEXIN 500 MG PO CAPS: 500.0000 mg | ORAL_CAPSULE | Freq: Two times a day (BID) | ORAL | 0 refills | Status: DC

## 2019-03-23 IMAGING — US US ABDOMEN LIMITED
1 series · 14 of 25 positions shown · non-contrast
Comparison: Chest CT 07/31/2017

CLINICAL DATA: Liver mass on CT

EXAM:
ULTRASOUND ABDOMEN LIMITED RIGHT UPPER QUADRANT

[Series 1: us abdomen limited · 0.20mm/px · 14 of 66 slices shown]
[im 1/66]
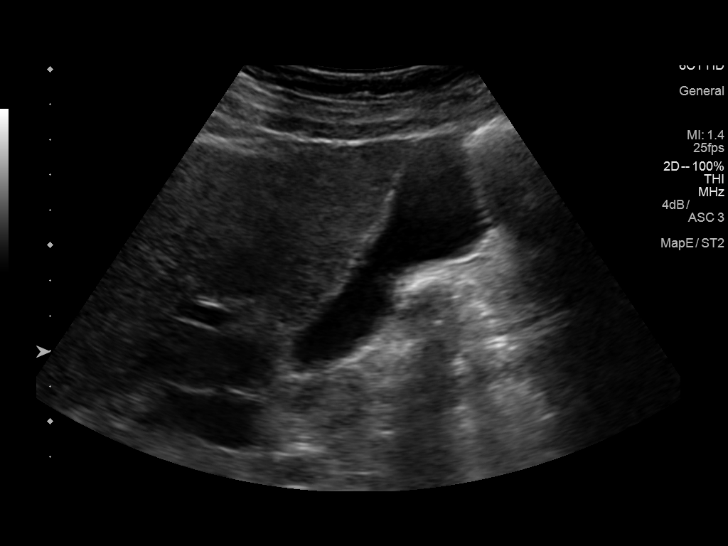
[im 6/66]
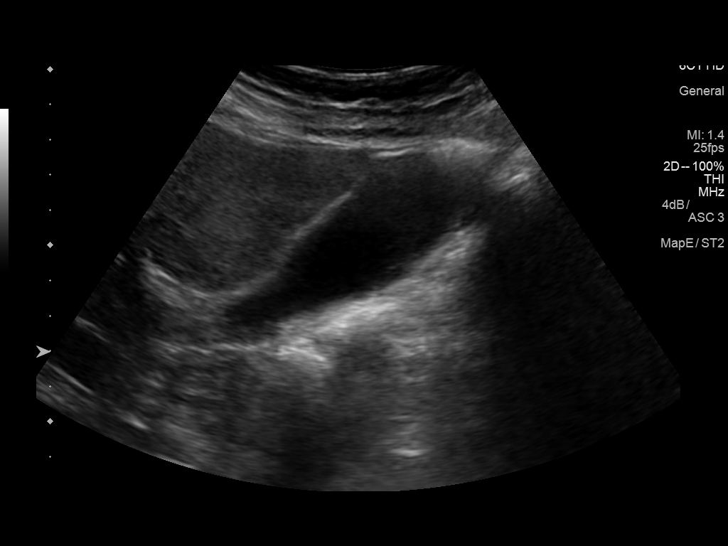
[im 11/66]
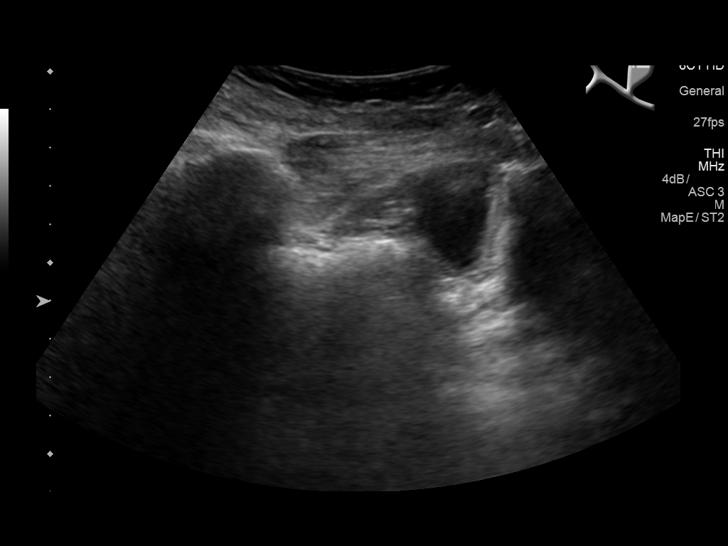
[im 17/66]
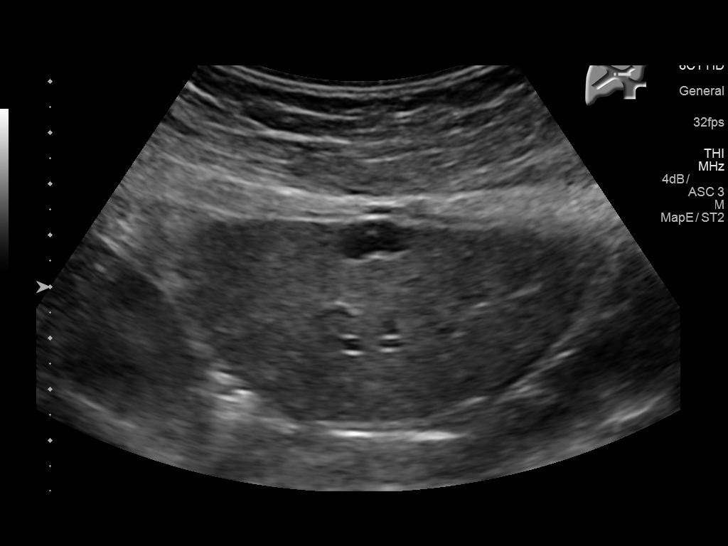
[im 22/66]
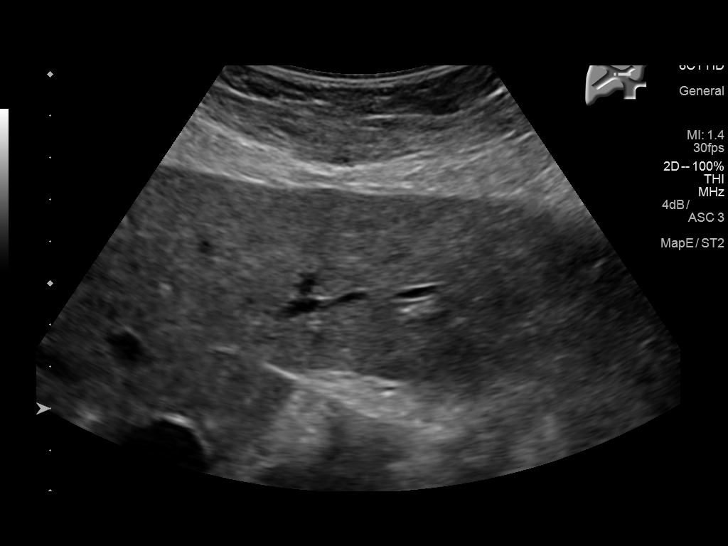
[im 25/66]
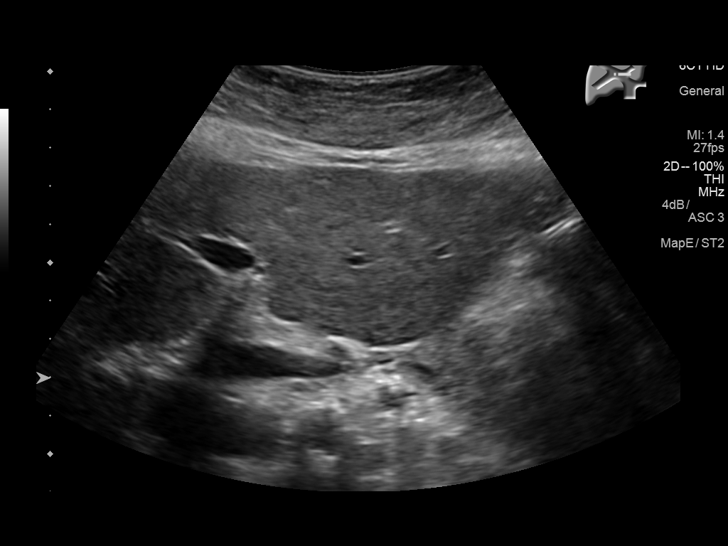
[im 30/66]
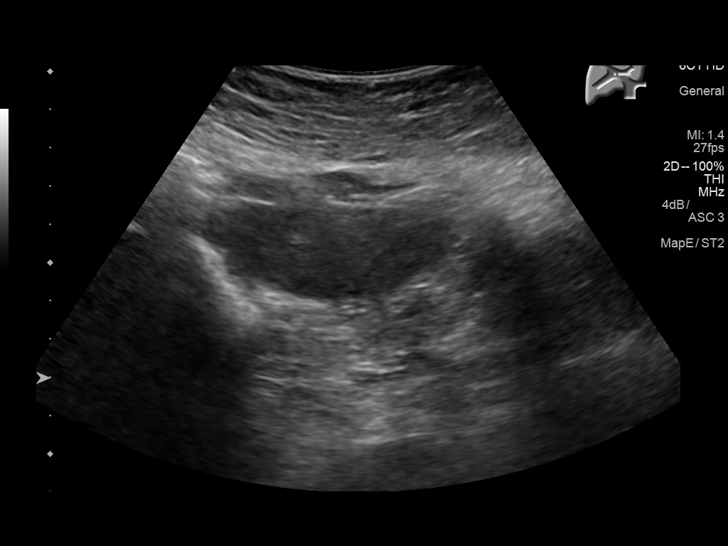
[im 36/66]
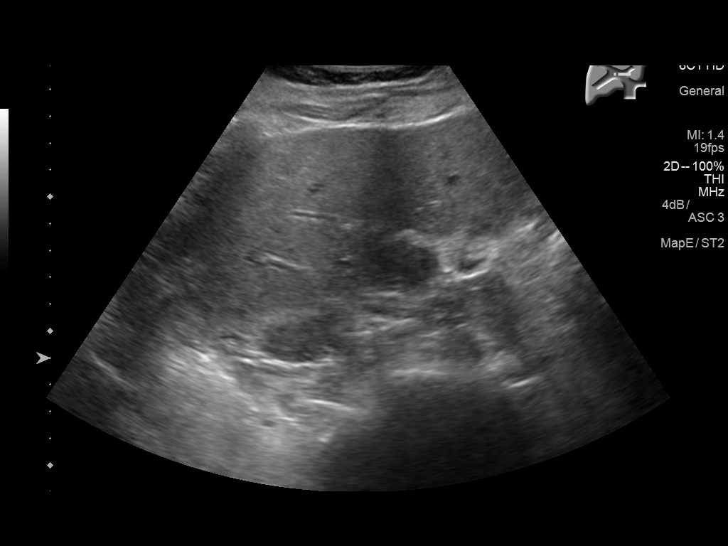
[im 41/66]
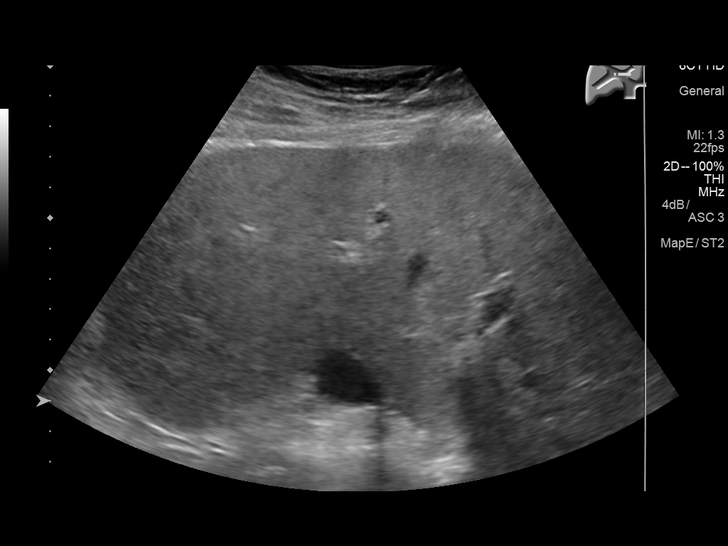
[im 44/66]
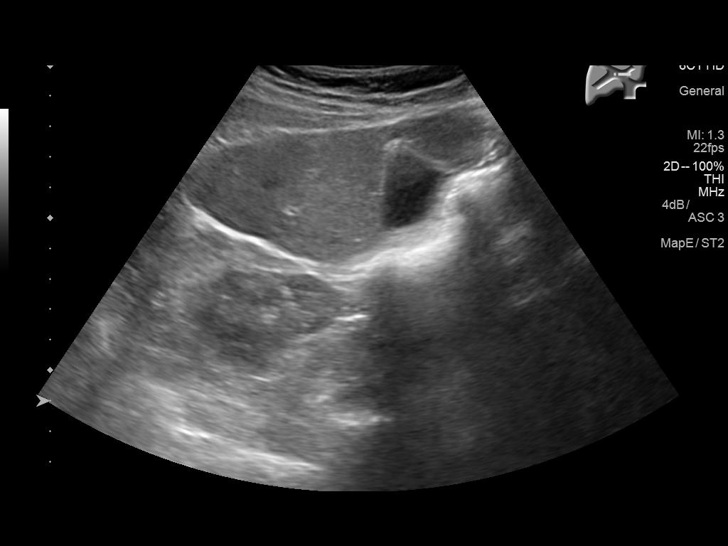
[im 49/66]
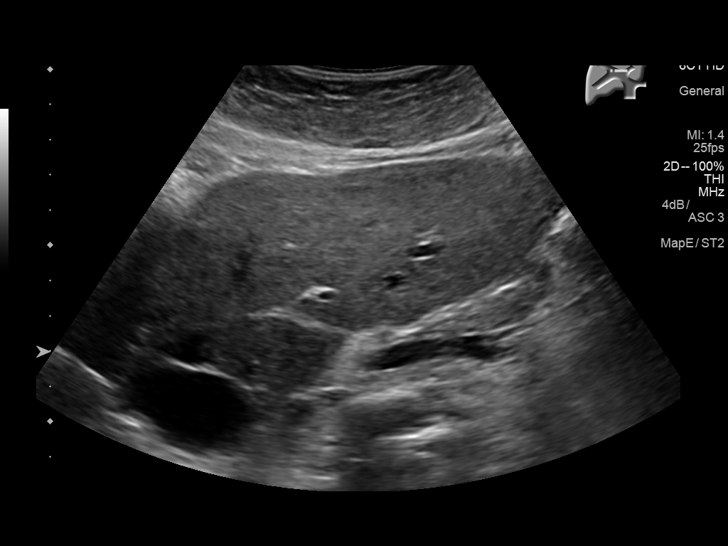
[im 55/66]
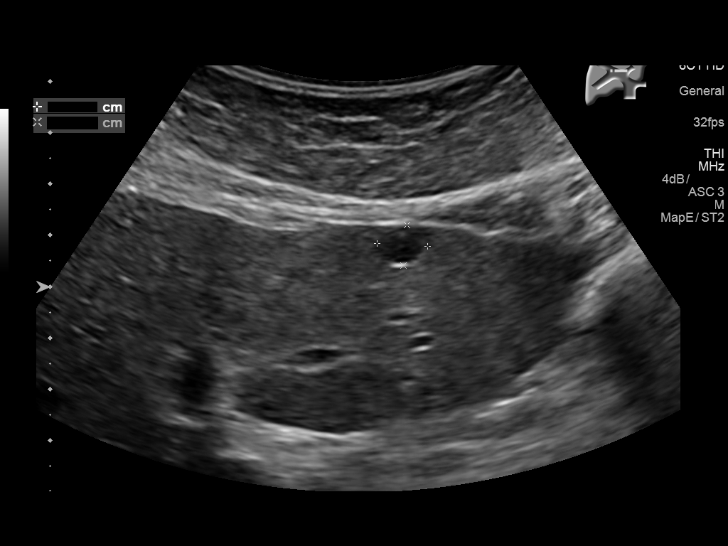
[im 60/66]
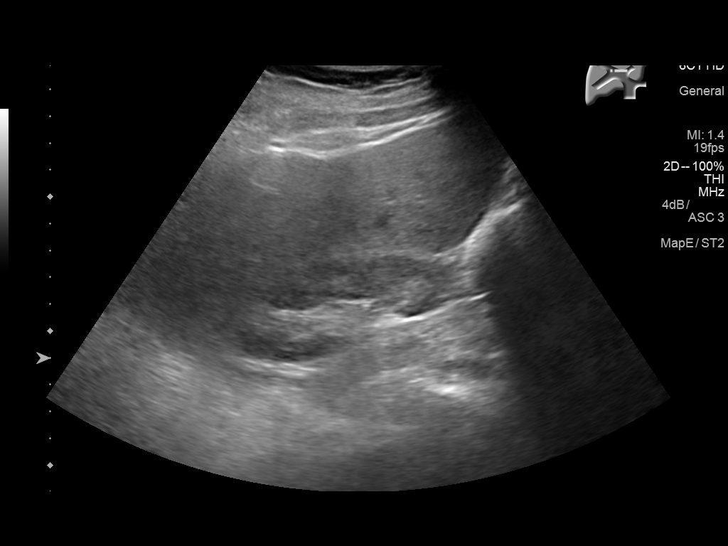
[im 66/66]
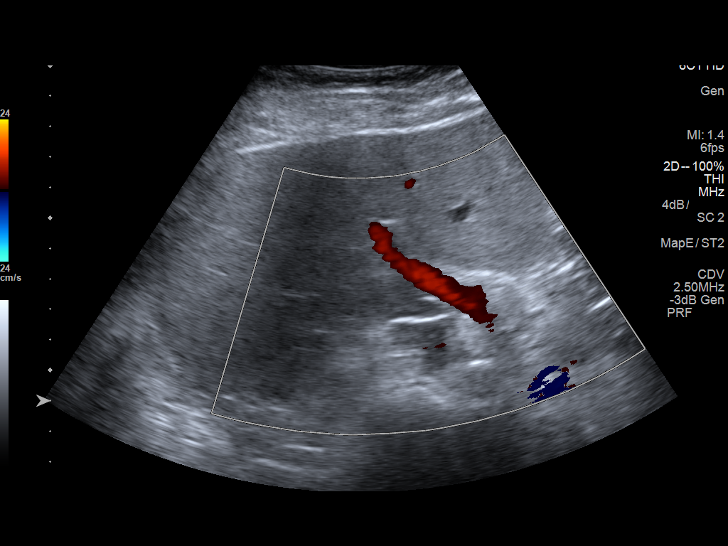

[14 of 25 positions shown; findings below may reference images not displayed]

FINDINGS: Gallbladder:

No gallstones or wall thickening visualized. No sonographic Murphy
sign noted by sonographer.

Common bile duct:

Diameter: Normal caliber, 3 mm

Liver:

Small cysts in the left hepatic lobe corresponding to the
abnormalities seen on chest CT. These measure 1.3 cm and 0.9 cm
maximally and appeared represent benign cysts. Portal vein is patent
on color Doppler imaging with normal direction of blood flow towards
the liver.
IMPRESSION: Small cystic areas within the left hepatic lobe as above, most
compatible with benign cysts. These could be followed with repeat
ultrasound in 6-12 months to ensure stability.

## 2019-05-20 ENCOUNTER — Ambulatory Visit: Payer: Medicare Other | Admitting: Cardiology

## 2019-05-21 ENCOUNTER — Encounter: Payer: Self-pay | Admitting: Cardiology

## 2019-05-21 ENCOUNTER — Ambulatory Visit (INDEPENDENT_AMBULATORY_CARE_PROVIDER_SITE_OTHER): Payer: Medicare Other | Admitting: Cardiology

## 2019-05-21 ENCOUNTER — Other Ambulatory Visit: Payer: Self-pay

## 2019-05-21 VITALS — BP 152/74 | HR 74 | Ht 61.0 in | Wt 145.1 lb

## 2019-05-21 DIAGNOSIS — I5032 Chronic diastolic (congestive) heart failure: Secondary | ICD-10-CM

## 2019-05-21 DIAGNOSIS — R7303 Prediabetes: Secondary | ICD-10-CM | POA: Diagnosis not present

## 2019-05-21 DIAGNOSIS — R0989 Other specified symptoms and signs involving the circulatory and respiratory systems: Secondary | ICD-10-CM | POA: Diagnosis not present

## 2019-05-21 DIAGNOSIS — R9431 Abnormal electrocardiogram [ECG] [EKG]: Secondary | ICD-10-CM

## 2019-05-21 NOTE — Patient Instructions (Signed)
Medication Instructions:  Your physician recommends that you continue on your current medications as directed. Please refer to the Current Medication list given to you today.  If you need a refill on your cardiac medications before your next appointment, please call your pharmacy.   Lab work: None.  If you have labs (blood work) drawn today and your tests are completely normal, you will receive your results only by: Marland Kitchen MyChart Message (if you have MyChart) OR . A paper copy in the mail If you have any lab test that is abnormal or we need to change your treatment, we will call you to review the results.  Testing/Procedures: Your physician has requested that you have a carotid duplex. This test is an ultrasound of the carotid arteries in your neck. It looks at blood flow through these arteries that supply the brain with blood. Allow one hour for this exam. There are no restrictions or special instructions.    Follow-Up: At [facility], you and your health needs are our priority.  As part of our continuing mission to provide you with exceptional heart care, we have created designated Provider Care Teams.  These Care Teams include your primary Cardiologist (physician) and Advanced Practice Providers (APPs -  Physician Assistants and Nurse Practitioners) who all work together to provide you with the care you need, when you need it. You will need a follow up appointment in 6 months.  Please call our office 2 months in advance to schedule this appointment.  You may see No primary care provider on file. or another member of our [employer] Provider Team in [place]: Shirlee More, MD . Jyl Heinz, MD  Any Other Special Instructions Will Be Listed Below (If Applicable).

## 2019-05-21 NOTE — Addendum Note (Signed)
Addended by: Ashok Norris on: 05/21/2019 03:26 PM   Modules accepted: Orders

## 2019-05-21 NOTE — Progress Notes (Signed)
Cardiology Office Note:    Date:  05/21/2019   ID:  Katie Waller, DOB [birth year], MRN [id number]  PCP:  Maury Dus, MD  Cardiologist:  Jenne Campus, MD    Referring MD: Maury Dus, MD   Chief Complaint  Patient presents with  . Follow-up  Doing well  History of Present Illness:    Katie Waller is a 73 y.o. female who is referred originally to [place] because of abnormal EKG.  It showed possibility of anterior wall myocardial infarction.  Echocardiogram has been done which showed normal left ventricle ejection fraction without segmental motion abnormalities.  Overall she is doing well cardiac wise she complained of having shortness of breath which is chronic issue most likely related to COPD.  Luckily she stopped smoking and she abstain from smoking additional problem that she has history of high blood pressure.  Apparently fairly controlled however recently some additional medication has been added.  Past Medical History:  Diagnosis Date  . Asthma   . Diabetes ([place])   . Heart disease   . Hypertension     Past Surgical History:  Procedure Laterality Date  . KNEE SURGERY Left     Current Medications: Current Meds  Medication Sig  . albuterol (PROVENTIL HFA;VENTOLIN HFA) 108 (90 BASE) MCG/ACT inhaler Inhale 2 puffs into the lungs every 6 (six) hours as needed for wheezing or shortness of breath.  Marland Kitchen amLODipine (NORVASC) 5 MG tablet Take 5 mg by mouth every morning.  Marland Kitchen aspirin 81 MG chewable tablet Chew 1 tablet (81 mg total) by mouth daily. (Patient taking differently: Chew 81 mg by mouth at bedtime. )  . benzonatate (TESSALON) 100 MG capsule Take 100 mg by mouth 3 (three) times daily as needed for cough.   . budesonide-formoterol (SYMBICORT) 160-4.5 MCG/ACT inhaler Inhale 2 puffs into the lungs 2 (two) times daily.  . Calcium 600-200 MG-UNIT tablet Take 1 tablet by mouth daily.  . furosemide (LASIX) 40 MG tablet Take 40 mg by mouth daily as needed for fluid or  edema.   Marland Kitchen HYDROcodone-acetaminophen (NORCO/VICODIN) 5-325 MG tablet Take 1 tablet by mouth every 6 (six) hours as needed.  Marland Kitchen ipratropium-albuterol (DUONEB) 0.5-2.5 (3) MG/3ML SOLN Take 3 mLs by nebulization 3 (three) times daily. DX: J44.1 (Patient taking differently: Take 3 mLs by nebulization as needed. DX: J44.1)  . LORazepam (ATIVAN) 1 MG tablet Take 1 tablet (1 mg total) by mouth every 6 (six) hours as needed for anxiety.  . Multiple Vitamin (MULTIVITAMIN) tablet Take 1 tablet by mouth daily.  . ondansetron (ZOFRAN ODT) 4 MG disintegrating tablet Take 1 tablet (4 mg total) by mouth every 8 (eight) hours as needed for nausea or vomiting.  . tiotropium (SPIRIVA) 18 MCG inhalation capsule Place 18 mcg into inhaler and inhale daily.  . traZODone (DESYREL) 100 MG tablet Take 1 tablet (100 mg total) by mouth at bedtime as needed for sleep. (Patient taking differently: Take 100 mg by mouth at bedtime. )     Allergies:   Ace inhibitors and Codeine   Social History   Socioeconomic History  . Marital status: Divorced    Spouse name: Not on file  . Number of children: Not on file  . Years of education: Not on file  . Highest education level: Not on file  Occupational History  . Not on file  Social Needs  . Financial resource strain: Not on file  . Food insecurity    Worry: Not on file  Inability: Not on file  . Transportation needs    Medical: Not on file    Non-medical: Not on file  Tobacco Use  . Smoking status: Former Smoker    Packs/day: 1.00    Years: 55.00    Pack years: 55.00    Types: Cigarettes    Start date: 07/23/2017    Quit date: 07/23/2017    Years since quitting: 1.8  . Smokeless tobacco: Never Used  Substance and Sexual Activity  . Alcohol use: No  . Drug use: No  . Sexual activity: Never  Lifestyle  . Physical activity    Days per week: Not on file    Minutes per session: Not on file  . Stress: Not on file  Relationships  . Social Musicianconnections    Talks  on phone: Not on file    Gets together: Not on file    Attends religious service: Not on file    Active member of club or organization: Not on file    Attends meetings of clubs or organizations: Not on file    Relationship status: Not on file  Other Topics Concern  . Not on file  Social History Narrative  . Not on file     Family History: The patient's family history includes Hyperlipidemia in her father and mother; Hypertension in her father and mother. ROS:   Please see the history of present illness.    All 14 point review of systems negative except as described per history of present illness  EKGs/Labs/Other Studies Reviewed:      Recent Labs: 12/24/2018: BUN 23; Creatinine, Ser 1.39; Hemoglobin 11.5; Platelets 418; Potassium 3.9; Sodium 140  Recent Lipid Panel    Component Value Date/Time   CHOL 169 09/18/2013 0614   TRIG 134 09/18/2013 0614   HDL 76 09/18/2013 0614   CHOLHDL 2.2 09/18/2013 0614   VLDL 27 09/18/2013 0614   LDLCALC 66 09/18/2013 0614    Physical Exam:    VS:  BP (!) 152/74   Pulse 74   Ht 5\' 1"  (1.549 m)   Wt 145 lb 1.9 oz (65.8 kg)   SpO2 99%   BMI 27.42 kg/m     Wt Readings from Last 3 Encounters:  05/21/19 145 lb 1.9 oz (65.8 kg)  11/14/18 147 lb 12.8 oz (67 kg)  09/03/18 151 lb (68.5 kg)     GEN:  Well nourished, well developed in no acute distress HEENT: Normal NECK: No JVD; No carotid bruits LYMPHATICS: No lymphadenopathy CARDIAC: RRR, no murmurs, no rubs, no gallops RESPIRATORY:  Clear to auscultation without rales, wheezing or rhonchi  ABDOMEN: Soft, non-tender, non-distended MUSCULOSKELETAL:  No edema; No deformity  SKIN: Warm and dry LOWER EXTREMITIES: no swelling NEUROLOGIC:  Alert and oriented x 3 PSYCHIATRIC:  Normal affect   ASSESSMENT:    1. Chronic diastolic CHF (congestive heart failure) (HCC)   2. Prediabetes   3. Abnormal finding on EKG    PLAN:    In order of problems listed above:  1. Chronic diastolic  congestive heart failure is to be compensated there is no swelling she is doing well overall. 2. Prediabetes followed by internal medicine team 3. Abnormal EKG possible to anterior wall MI but echocardiogram did not confirm that normal ejection fraction without segmental motion abnormalities 4. Essential hypertension asked her to check blood pressure on the regular basis and bring results to us within next 2 weeks.   Medication Adjustments/Labs and Tests Ordered: Current medicines are reviewed  at length with the patient today.  Concerns regarding medicines are outlined above.  No orders of the defined types were placed in this encounter.  Medication changes: No orders of the defined types were placed in this encounter.   Signed, Georgeanna Lea, MD, [facility] 05/21/2019 3:10 PM    [facility]

## 2019-05-28 ENCOUNTER — Other Ambulatory Visit: Payer: Self-pay

## 2019-05-28 ENCOUNTER — Ambulatory Visit (HOSPITAL_BASED_OUTPATIENT_CLINIC_OR_DEPARTMENT_OTHER)
Admission: RE | Admit: 2019-05-28 | Discharge: 2019-05-28 | Disposition: A | Discharge status: Home / Self Care | Payer: Medicare Other | Source: Ambulatory Visit | Attending: Cardiology | Admitting: Cardiology

## 2019-05-28 DIAGNOSIS — R0989 Other specified symptoms and signs involving the circulatory and respiratory systems: Secondary | ICD-10-CM | POA: Insufficient documentation

## 2019-05-28 NOTE — Progress Notes (Signed)
Bilateral carotid doppler performed    05/28/19 Joseph Jennings Stirling RDCS, RVT 

## 2019-08-23 ENCOUNTER — Other Ambulatory Visit: Payer: Self-pay

## 2019-08-23 MED ORDER — BUDESONIDE-FORMOTEROL FUMARATE 160-4.5 MCG/ACT IN AERO: 2.0000 | INHALATION_SPRAY | Freq: Two times a day (BID) | RESPIRATORY_TRACT | 4 refills | Status: DC

## 2019-09-17 ENCOUNTER — Inpatient Hospital Stay: Admission: RE | Admit: 2019-09-17 | Payer: Medicare Other | Source: Ambulatory Visit

## 2019-10-09 ENCOUNTER — Ambulatory Visit (INDEPENDENT_AMBULATORY_CARE_PROVIDER_SITE_OTHER)
Admission: RE | Admit: 2019-10-09 | Discharge: 2019-10-09 | Disposition: A | Discharge status: Home / Self Care | Payer: Medicare Other | Source: Ambulatory Visit | Attending: Acute Care | Admitting: Acute Care

## 2019-10-09 ENCOUNTER — Other Ambulatory Visit: Payer: Self-pay

## 2019-10-09 DIAGNOSIS — Z122 Encounter for screening for malignant neoplasm of respiratory organs: Secondary | ICD-10-CM

## 2019-10-09 DIAGNOSIS — Z87891 Personal history of nicotine dependence: Secondary | ICD-10-CM

## 2019-10-09 NOTE — Progress Notes (Signed)
Please call patient and let them  know their  low dose Ct was read as a Lung RADS 2: nodules that are benign in appearance and behavior with a very low likelihood of becoming a clinically active cancer due to size or lack of growth. Recommendation per radiology is for a repeat LDCT in 12 months. .Please let them  know we will order and schedule their  annual screening scan for 10/2020. Please let them  know there was notation of CAD on their  scan.  Please remind the patient  that this is a non-gated exam therefore degree or severity of disease  cannot be determined. Please have them  follow up with their PCP regarding potential risk factor modification, dietary therapy or pharmacologic therapy if clinically indicated. Pt.  is not currently on statin therapy. Please place order for annual  screening scan for  10/2020 and fax results to PCP. Thanks so much. 

## 2019-10-09 NOTE — Progress Notes (Signed)
Pt. Has had a recent echocardiogram . Pulmonary artery is normal in size and origin.

## 2019-10-10 ENCOUNTER — Other Ambulatory Visit: Payer: Self-pay | Admitting: *Deleted

## 2019-10-10 DIAGNOSIS — Z87891 Personal history of nicotine dependence: Secondary | ICD-10-CM

## 2020-01-03 ENCOUNTER — Encounter: Payer: Self-pay | Admitting: Primary Care

## 2020-01-03 ENCOUNTER — Ambulatory Visit (INDEPENDENT_AMBULATORY_CARE_PROVIDER_SITE_OTHER): Payer: Medicare Other | Admitting: Primary Care

## 2020-01-03 ENCOUNTER — Telehealth: Payer: Self-pay | Admitting: Pulmonary Disease

## 2020-01-03 ENCOUNTER — Other Ambulatory Visit: Payer: Self-pay

## 2020-01-03 VITALS — BP 142/70 | HR 106 | Temp 98.3°F | Ht 61.0 in | Wt 152.8 lb

## 2020-01-03 DIAGNOSIS — J449 Chronic obstructive pulmonary disease, unspecified: Secondary | ICD-10-CM | POA: Diagnosis not present

## 2020-01-03 NOTE — Progress Notes (Signed)
@Patient  ID: , female    DOB: [birth year], 74 y.o.   MRN: [id number]  Chief Complaint  Patient presents with  . Follow-up    f/u copd     Referring provider: [id number], MD  HPI: 74 year old female, former smoker. PMH significant for COPD. Patient of Dr. 62, last seen on 07/2018.  Maintained on Symbicort and Spiriva. She did not like Trelegy. She is on 3L pulsed oxygen. Follows with lung cancer screening program. LDCT in February lung RADS2. Due for CT chest in February 2022.   01/03/2020 Patient presents today for regular follow-up. Reports that her breathing has been up and down.She does have dyspnea with moderate exertion. No significant cough or wheezing. Her daughter had covid in January 2021. Patient was never tested. She did have flu like symptoms. She was not hospitalized but needed to increase her oxygen from 2 to 3L. No other residual effects. She has not had covid vaccine.    PFTs 10/02/17 FVC 1.6 [60%], FEV1 0.73 [36%], F/F 45, TLC 129%, 3/0 369%, DLCO 33% Severe obstruction, bronchodilator response, hyperinflation with air trapping, severe diffusion impairment.  Cardiac Echocardiogram 07/25/2017 LVEF 65-70%, grade 1 diastolic dysfunction.  No pulmonary hypertension.   Allergies  Allergen Reactions  . Ace Inhibitors Swelling  . Codeine Nausea And Vomiting    Immunization History  Administered Date(s) Administered  . Influenza, High Dose Seasonal PF 06/19/2017  . Pneumococcal Conjugate-13 10/24/2013  . Pneumococcal Polysaccharide-23 12/08/2014    Past Medical History:  Diagnosis Date  . Asthma   . Diabetes (HCC)   . Heart disease   . Hypertension     Tobacco History: Social History   Tobacco Use  Smoking Status Former Smoker  . Packs/day: 1.00  . Years: 55.00  . Pack years: 55.00  . Types: Cigarettes  . Start date: 07/23/2017  . Quit date: 07/23/2017  . Years since quitting: 2.4  Smokeless Tobacco Never Used   Counseling  given: Not Answered   Outpatient Medications Prior to Visit  Medication Sig Dispense Refill  . albuterol (PROVENTIL HFA;VENTOLIN HFA) 108 (90 BASE) MCG/ACT inhaler Inhale 2 puffs into the lungs every 6 (six) hours as needed for wheezing or shortness of breath. 1 Inhaler 0  . amLODipine (NORVASC) 5 MG tablet Take 5 mg by mouth every morning.    07/25/2017 aspirin 81 MG chewable tablet Chew 1 tablet (81 mg total) by mouth daily. (Patient taking differently: Chew 81 mg by mouth at bedtime. )    . benzonatate (TESSALON) 100 MG capsule Take 100 mg by mouth 3 (three) times daily as needed for cough.     . budesonide-formoterol (SYMBICORT) 160-4.5 MCG/ACT inhaler Inhale 2 puffs into the lungs 2 (two) times daily. 1 Inhaler 4  . Calcium 600-200 MG-UNIT tablet Take 1 tablet by mouth daily.    . furosemide (LASIX) 40 MG tablet Take 40 mg by mouth daily as needed for fluid or edema.     Marland Kitchen ipratropium-albuterol (DUONEB) 0.5-2.5 (3) MG/3ML SOLN Take 3 mLs by nebulization 3 (three) times daily. DX: J44.1 (Patient taking differently: Take 3 mLs by nebulization as needed. DX: J44.1) 360 mL 3  . LORazepam (ATIVAN) 1 MG tablet Take 1 tablet (1 mg total) by mouth every 6 (six) hours as needed for anxiety. 30 tablet 0  . Multiple Vitamin (MULTIVITAMIN) tablet Take 1 tablet by mouth daily.    . ondansetron (ZOFRAN ODT) 4 MG disintegrating tablet Take 1 tablet (4 mg total)  by mouth every 8 (eight) hours as needed for nausea or vomiting. 5 tablet 0  . tiotropium (SPIRIVA) 18 MCG inhalation capsule Place 18 mcg into inhaler and inhale daily.    . traZODone (DESYREL) 100 MG tablet Take 1 tablet (100 mg total) by mouth at bedtime as needed for sleep. (Patient taking differently: Take 100 mg by mouth at bedtime. ) 10 tablet 0  . HYDROcodone-acetaminophen (NORCO/VICODIN) 5-325 MG tablet Take 1 tablet by mouth every 6 (six) hours as needed. 3 tablet 0   No facility-administered medications prior to visit.    Review of Systems   Review of Systems  Constitutional: Negative.   Respiratory: Positive for shortness of breath. Negative for cough and wheezing.   Cardiovascular: Negative.     Physical Exam  BP (!) 142/70 (BP Location: Left Arm, Cuff Size: Normal)   Pulse (!) 106   Temp 98.3 F (36.8 C) (Temporal)   Ht 5\' 1"  (1.549 m)   Wt 152 lb 12.8 oz (69.3 kg)   SpO2 94%   BMI 28.87 kg/m  Physical Exam Constitutional:      Appearance: Normal appearance.  HENT:     Head: Normocephalic and atraumatic.     Mouth/Throat:     Mouth: Mucous membranes are moist.     Pharynx: Oropharynx is clear.  Cardiovascular:     Rate and Rhythm: Normal rate and regular rhythm.     Comments: Trace pedal  Pulmonary:     Effort: Pulmonary effort is normal.     Breath sounds: No wheezing.     Comments: Diminished; 3L pulsed O2  Neurological:     General: No focal deficit present.     Mental Status: She is alert and oriented to person, place, and time. Mental status is at baseline.  Psychiatric:        Mood and Affect: Mood normal.        Behavior: Behavior normal.        Thought Content: Thought content normal.        Judgment: Judgment normal.      Lab Results:  CBC    Component Value Date/Time   WBC 11.3 (H) 12/24/2018 1801   RBC 4.18 12/24/2018 1801   HGB 11.5 (L) 12/24/2018 1801   HCT 38.2 12/24/2018 1801   PLT 418 (H) 12/24/2018 1801   MCV 91.4 12/24/2018 1801   MCH 27.5 12/24/2018 1801   MCHC 30.1 12/24/2018 1801   RDW 13.9 12/24/2018 1801   LYMPHSABS 1.7 12/24/2018 1801   MONOABS 0.8 12/24/2018 1801   EOSABS 0.1 12/24/2018 1801   BASOSABS 0.1 12/24/2018 1801    BMET    Component Value Date/Time   NA 140 12/24/2018 1801   K 3.9 12/24/2018 1801   CL 102 12/24/2018 1801   CO2 29 12/24/2018 1801   GLUCOSE 137 (H) 12/24/2018 1801   BUN 23 12/24/2018 1801   CREATININE 1.39 (H) 12/24/2018 1801   CALCIUM 9.3 12/24/2018 1801   GFRNONAA 38 (L) 12/24/2018 1801   GFRAA 44 (L) 12/24/2018 1801     BNP    Component Value Date/Time   BNP 84.9 07/24/2017 1100    ProBNP No results found for: PROBNP  Imaging: No results found.   Assessment & Plan:   COPD, group D, by GOLD 2017 classification ([place]) - Presumed Covid-19 in January 2021, not hospitalized - Experiences moderate dyspnea with exertion, increased oxygen from 2 to 3L since last visit  - Continue Symbicort 160 two puffs  twice daily  - Continue Spiriva handihaler once daily - Recommend patient get COVID vaccine  - Follows with lung cancer screening program, due for LDCT in February 2022  - Refer to pulmonary rehab    Glenford Bayley, NP 01/04/2020

## 2020-01-03 NOTE — Telephone Encounter (Signed)
Order has been fixed.  Nothing further needed 

## 2020-01-03 NOTE — Patient Instructions (Addendum)
COPD: Continue Symbicort two puffs twice daily (rinse mouth after use) Continue Spiriva two puffs once daily Recommend you get covid vaccine  Chronic respiratory failure: You need to wear __3____ L on exertion to keep O2 >88-90%  Referral: Pulmonary rehab   Follow-up: 3 months with Dr. Isaiah Serge or sooner if needed

## 2020-01-04 NOTE — Assessment & Plan Note (Addendum)
-   Presumed Covid-19 in January 2021, not hospitalized - Experiences moderate dyspnea with exertion, increased oxygen from 2 to 3L since last visit  - Continue Symbicort 160 two puffs twice daily  - Continue Spiriva handihaler once daily - Recommend patient get COVID vaccine  - Follows with lung cancer screening program, due for LDCT in February 2022  - Refer to pulmonary rehab

## 2020-01-06 ENCOUNTER — Encounter (HOSPITAL_COMMUNITY): Payer: Self-pay | Admitting: *Deleted

## 2020-01-06 NOTE — Progress Notes (Signed)
Received referral from Dr. Isaiah Serge for this pt to participate in pulmonary rehab with the the diagnosis of COPD Stage IV. Pt with post predicted FEV1 27 per PFT completed 09/2017.  Clinical review of pt follow up appt on 01/04/20 with Ames Dura NP with Dr. Isaiah Serge Pulmonary office note.  Pt with Covid Risk Score - 5. Pt appropriate for scheduling for Pulmonary rehab.  Will forward to support staff for verification of insurance  Eligibility/benefits and to pulmonary rehab staff for scheduling with pt consent. Alanson Aly, BSN Cardiac and Emergency planning/management officer

## 2020-01-07 ENCOUNTER — Telehealth (HOSPITAL_COMMUNITY): Payer: Self-pay

## 2020-01-07 NOTE — Telephone Encounter (Signed)
Pt insurance is active and benefits verified through [facility] Medicare Co-pay 0, DED 0/0 met, out of pocket $7,550/$7,550 met, co-insurance 20%. no pre-authorization required, [place]/[employer], REF# [id number]  Passed to [employer]. In the pulmonary rehab department for scheduling!

## 2020-01-08 ENCOUNTER — Telehealth (HOSPITAL_COMMUNITY): Payer: Self-pay | Admitting: *Deleted

## 2020-01-22 ENCOUNTER — Telehealth (HOSPITAL_COMMUNITY): Payer: Self-pay

## 2020-01-22 NOTE — Telephone Encounter (Signed)
Spoke with pt and her daughter, Luther Hearing. Pt is scheduled for pulmonary rehab orientation 6/2 @ 0900 and will begin exercise 6/8. Her daughter will be providing transportation and will give me a call back in the next few days to choose [facility] class time. Insurance coverage reviewed. Instructed to wear a mask and tennis shoes.

## 2020-01-29 ENCOUNTER — Telehealth (HOSPITAL_COMMUNITY): Payer: Self-pay

## 2020-02-05 ENCOUNTER — Encounter (HOSPITAL_COMMUNITY)
Admission: RE | Admit: 2020-02-05 | Discharge: 2020-02-05 | Disposition: A | Discharge status: Home / Self Care | Payer: Medicare Other | Source: Ambulatory Visit | Attending: Pulmonary Disease | Admitting: Pulmonary Disease

## 2020-02-05 ENCOUNTER — Other Ambulatory Visit: Payer: Self-pay

## 2020-02-05 VITALS — BP 130/80 | HR 102 | Ht 61.5 in | Wt 151.7 lb

## 2020-02-05 DIAGNOSIS — J449 Chronic obstructive pulmonary disease, unspecified: Secondary | ICD-10-CM | POA: Insufficient documentation

## 2020-02-05 NOTE — Progress Notes (Signed)
Pulmonary Individual Treatment Plan  Patient Details  Name: Katie Waller MRN: [id number] Date of Birth: 1945/09/20 Referring Provider:     Pulmonary Rehab Walk Test from 02/05/2020 in [facility] CARDIAC REHAB  Referring Provider  Dr. Chilton Greathouse MD      Initial Encounter Date:    Pulmonary Rehab Walk Test from 02/05/2020 in [facility] CARDIAC REHAB  Date  02/05/20      Visit Diagnosis: Chronic obstructive pulmonary disease, unspecified COPD type (HCC)  Patient's Home Medications on Admission:   Current Outpatient Medications:  .  albuterol (PROVENTIL HFA;VENTOLIN HFA) 108 (90 BASE) MCG/ACT inhaler, Inhale 2 puffs into the lungs every 6 (six) hours as needed for wheezing or shortness of breath., Disp: 1 Inhaler, Rfl: 0 .  amLODipine (NORVASC) 5 MG tablet, Take 5 mg by mouth every morning., Disp: , Rfl:  .  aspirin 81 MG chewable tablet, Chew 1 tablet (81 mg total) by mouth daily. (Patient taking differently: Chew 81 mg by mouth at bedtime. ), Disp: , Rfl:  .  benzonatate (TESSALON) 100 MG capsule, Take 100 mg by mouth 3 (three) times daily as needed for cough. , Disp: , Rfl:  .  budesonide-formoterol (SYMBICORT) 160-4.5 MCG/ACT inhaler, Inhale 2 puffs into the lungs 2 (two) times daily., Disp: 1 Inhaler, Rfl: 4 .  Calcium 600-200 MG-UNIT tablet, Take 1 tablet by mouth daily., Disp: , Rfl:  .  furosemide (LASIX) 40 MG tablet, Take 40 mg by mouth daily as needed for fluid or edema. , Disp: , Rfl:  .  gabapentin (NEURONTIN) 300 MG capsule, Take 300 mg by mouth at bedtime., Disp: , Rfl:  .  ipratropium-albuterol (DUONEB) 0.5-2.5 (3) MG/3ML SOLN, Take 3 mLs by nebulization 3 (three) times daily. DX: J44.1 (Patient taking differently: Take 3 mLs by nebulization as needed. DX: J44.1), Disp: 360 mL, Rfl: 3 .  LORazepam (ATIVAN) 1 MG tablet, Take 1 tablet (1 mg total) by mouth every 6 (six) hours as needed for anxiety., Disp: 30 tablet, Rfl: 0 .  losartan  (COZAAR) 50 MG tablet, Take 50 mg by mouth daily., Disp: , Rfl:  .  Multiple Vitamin (MULTIVITAMIN) tablet, Take 1 tablet by mouth daily., Disp: , Rfl:  .  ondansetron (ZOFRAN ODT) 4 MG disintegrating tablet, Take 1 tablet (4 mg total) by mouth every 8 (eight) hours as needed for nausea or vomiting., Disp: 5 tablet, Rfl: 0 .  tiotropium (SPIRIVA) 18 MCG inhalation capsule, Place 18 mcg into inhaler and inhale daily., Disp: , Rfl:  .  traZODone (DESYREL) 100 MG tablet, Take 1 tablet (100 mg total) by mouth at bedtime as needed for sleep. (Patient taking differently: Take 100 mg by mouth at bedtime. ), Disp: 10 tablet, Rfl: 0  Past Medical History: Past Medical History:  Diagnosis Date  . Asthma   . Diabetes (HCC)   . Heart disease   . Hypertension     Tobacco Use: Social History   Tobacco Use  Smoking Status Former Smoker  . Packs/day: 1.00  . Years: 55.00  . Pack years: 55.00  . Types: Cigarettes  . Start date: 07/23/2017  . Quit date: 07/23/2017  . Years since quitting: 2.5  Smokeless Tobacco Never Used    Labs: Recent Review Advice worker    Labs for ITP Cardiac and Pulmonary Rehab Latest Ref Rng & Units 09/17/2013 09/18/2013 07/26/2017 07/31/2017   Cholestrol 0 - 200 mg/dL - 831 - -   LDLCALC 0 -  99 mg/dL - 66 - -   HDL >66 mg/dL - 76 - -   Trlycerides <150 mg/dL - 440 - -   Hemoglobin A1c <5.7 % 6.1(H) - - -   PHART 7.350 - 7.450 - - 7.310(L) 7.334(L)   PCO2ART 32.0 - 48.0 mmHg - - 92.7(HH) 89.0(HH)   HCO3 20.0 - 28.0 mmol/L - - 45.3(H) 46.0(H)   O2SAT % - - 81.4 90.4      Capillary Blood Glucose: No results found for: GLUCAP   Pulmonary Assessment Scores: Pulmonary Assessment Scores    Row Name 02/05/20 1105 02/05/20 1241       ADL UCSD   ADL Phase  --  Entry    SOB Score total  --  62      CAT Score   CAT Score  --  27      mMRC Score   mMRC Score  4  4      UCSD: Self-administered rating of dyspnea associated with activities of daily living  (ADLs) 6-point scale (0 = "not at all" to 5 = "maximal or unable to do because of breathlessness")  Scoring Scores range from 0 to 120.  Minimally important difference is 5 units  CAT: CAT can identify the health impairment of COPD patients and is better correlated with disease progression.  CAT has a scoring range of zero to 40. The CAT score is classified into four groups of low (less than 10), medium (10 - 20), high (21-30) and very high (31-40) based on the impact level of disease on health status. A CAT score over 10 suggests significant symptoms.  A worsening CAT score could be explained by an exacerbation, poor medication adherence, poor inhaler technique, or progression of COPD or comorbid conditions.  CAT MCID is 2 points  mMRC: mMRC (Modified Medical Research Council) Dyspnea Scale is used to assess the degree of baseline functional disability in patients of respiratory disease due to dyspnea. No minimal important difference is established. A decrease in score of 1 point or greater is considered a positive change.   Pulmonary Function Assessment: Pulmonary Function Assessment - 02/05/20 1241      Post Bronchodilator Spirometry Results   FEV1%  36 %      Breath   Bilateral Breath Sounds  Clear;Decreased    Shortness of Breath  Yes;Limiting activity       Exercise Target Goals: Exercise Program Goal: Individual exercise prescription set using results from initial 6 min walk test and THRR while considering  patient's activity barriers and safety.   Exercise Prescription Goal: Initial exercise prescription builds to 30-45 minutes a day of aerobic activity, 2-3 days per week.  Home exercise guidelines will be given to patient during program as part of exercise prescription that the participant will acknowledge.  Activity Barriers & Risk Stratification: Activity Barriers & Cardiac Risk Stratification - 02/05/20 0954      Activity Barriers & Cardiac Risk Stratification    Activity Barriers  Deconditioning;Muscular Weakness;Shortness of Breath;Arthritis;History of Falls;Assistive Device       6 Minute Walk: 6 Minute Walk    Row Name 02/05/20 1058         6 Minute Walk   Distance  904 feet     Walk Time  6 minutes Total Walk Time: 5 minutes, 18 seconds     # of Rest Breaks  1     MPH  1.7     METS  2.3  RPE  13     Perceived Dyspnea   1     VO2 Peak  8.1     Symptoms  Yes (comment)     Comments  Mild SOB, Took 1 42 second rest break halfway through walk test     Resting HR  99 bpm     Resting BP  130/80     Resting Oxygen Saturation   100 %     Exercise Oxygen Saturation  during 6 min walk  95 %     Max Ex. HR  124 bpm     Max Ex. BP  136/84     2 Minute Post BP  128/76        Oxygen Initial Assessment: Oxygen Initial Assessment - 02/05/20 0959      Home Oxygen   Home Oxygen Device  E-Tanks;Home Concentrator;Portable Concentrator    Sleep Oxygen Prescription  Continuous    Liters per minute  3    Home Exercise Oxygen Prescription  Pulsed    Liters per minute  3    Home at Rest Exercise Oxygen Prescription  Continuous    Liters per minute  3    Compliance with Home Oxygen Use  Yes       Oxygen Re-Evaluation:   Oxygen Discharge (Final Oxygen Re-Evaluation):   Initial Exercise Prescription: Initial Exercise Prescription - 02/05/20 1100      Date of Initial Exercise RX and Referring Provider   Date  02/05/20    Referring Provider  Dr. Chilton GreathouseMannam, Praveen MD    Expected Discharge Date  04/03/20      Oxygen   Oxygen  Continuous    Liters  3      NuStep   Level  2    SPM  75    Minutes  30    METs  1.5      Prescription Details   Frequency (times per week)  2x    Duration  Progress to 10 minutes continuous walking  at current work load and total walking time to 30-45 min      Intensity   THRR 40-80% of Max Heartrate  60-119    Ratings of Perceived Exertion  11-13    Perceived Dyspnea  0-4      Progression    Progression  Continue progressive overload as per policy without signs/symptoms or physical distress.      Resistance Training   Training Prescription  Yes    Weight  Orange Band    Reps  10-15       Perform Capillary Blood Glucose checks as needed.  Exercise Prescription Changes:   Exercise Comments:   Exercise Goals and Review: Exercise Goals    Row Name 02/05/20 1105             Exercise Goals   Increase Physical Activity  Yes       Intervention  Provide advice, education, support and counseling about physical activity/exercise needs.;Develop an individualized exercise prescription for aerobic and resistive training based on initial evaluation findings, risk stratification, comorbidities and participant's personal goals.       Expected Outcomes  Short Term: Attend rehab on a regular basis to increase amount of physical activity.;Long Term: Add in home exercise to make exercise part of routine and to increase amount of physical activity.;Long Term: Exercising regularly at least 3-5 days a week.       Increase Strength and Stamina  Yes  Intervention  Provide advice, education, support and counseling about physical activity/exercise needs.;Develop an individualized exercise prescription for aerobic and resistive training based on initial evaluation findings, risk stratification, comorbidities and participant's personal goals.       Expected Outcomes  Short Term: Perform resistance training exercises routinely during rehab and add in resistance training at home;Short Term: Increase workloads from initial exercise prescription for resistance, speed, and METs.;Long Term: Improve cardiorespiratory fitness, muscular endurance and strength as measured by increased METs and functional capacity (6MWT)       Able to understand and use rate of perceived exertion (RPE) scale  Yes       Intervention  Provide education and explanation on how to use RPE scale       Expected Outcomes  Short  Term: Able to use RPE daily in rehab to express subjective intensity level;Long Term:  Able to use RPE to guide intensity level when exercising independently       Able to understand and use Dyspnea scale  Yes       Intervention  Provide education and explanation on how to use Dyspnea scale       Expected Outcomes  Short Term: Able to use Dyspnea scale daily in rehab to express subjective sense of shortness of breath during exertion;Long Term: Able to use Dyspnea scale to guide intensity level when exercising independently       Knowledge and understanding of Target Heart Rate Range (THRR)  Yes       Intervention  Provide education and explanation of THRR including how the numbers were predicted and where they are located for reference       Expected Outcomes  Short Term: Able to state/look up THRR;Long Term: Able to use THRR to govern intensity when exercising independently;Short Term: Able to use daily as guideline for intensity in rehab       Understanding of Exercise Prescription  Yes       Intervention  Provide education, explanation, and written materials on patient's individual exercise prescription       Expected Outcomes  Short Term: Able to explain program exercise prescription;Long Term: Able to explain home exercise prescription to exercise independently          Exercise Goals Re-Evaluation :   Discharge Exercise Prescription (Final Exercise Prescription Changes):   Nutrition:  Target Goals: Understanding of nutrition guidelines, daily intake of sodium 1500mg , cholesterol 200mg , calories 30% from fat and 7% or less from saturated fats, daily to have 5 or more servings of fruits and vegetables.  Biometrics: Pre Biometrics - 02/05/20 0955      Pre Biometrics   Height  5' 1.5" (1.562 m)    Weight  68.8 kg    BMI (Calculated)  28.2    Grip Strength  18 kg        Nutrition Therapy Plan and Nutrition Goals:   Nutrition Assessments:   Nutrition Goals  Re-Evaluation:   Nutrition Goals Discharge (Final Nutrition Goals Re-Evaluation):   Psychosocial: Target Goals: Acknowledge presence or absence of significant depression and/or stress, maximize coping skills, provide positive support system. Participant is able to verbalize types and ability to use techniques and skills needed for reducing stress and depression.  Initial Review & Psychosocial Screening: Initial Psych Review & Screening - 02/05/20 1000      Initial Review   Current issues with  None Identified      Family Dynamics   Good Support System?  Yes  Barriers   Psychosocial barriers to participate in program  There are no identifiable barriers or psychosocial needs.      Screening Interventions   Interventions  Encouraged to exercise       Quality of Life Scores:  Scores of 19 and below usually indicate a poorer quality of life in these areas.  A difference of  2-3 points is a clinically meaningful difference.  A difference of 2-3 points in the total score of the Quality of Life Index has been associated with significant improvement in overall quality of life, self-image, physical symptoms, and general health in studies assessing change in quality of life.  PHQ-9: Recent Review Flowsheet Data    Depression screen [facility] 2/9 02/05/2020   Decreased Interest 0   Down, Depressed, Hopeless 0   PHQ - 2 Score 0   Altered sleeping 0   Tired, decreased energy 0   Change in appetite 0   Feeling bad or failure about yourself  0   Trouble concentrating 0   Moving slowly or fidgety/restless 0   Suicidal thoughts 0   PHQ-9 Score 0   Difficult doing work/chores Not difficult at all     Interpretation of Total Score  Total Score Depression Severity:  1-4 = Minimal depression, 5-9 = Mild depression, 10-14 = Moderate depression, 15-19 = Moderately severe depression, 20-27 = Severe depression   Psychosocial Evaluation and Intervention: Psychosocial Evaluation - 02/05/20 1242       Psychosocial Evaluation & Interventions   Continue Psychosocial Services   No Follow up required       Psychosocial Re-Evaluation:   Psychosocial Discharge (Final Psychosocial Re-Evaluation):   Education: Education Goals: Education classes will be provided on a weekly basis, covering required topics. Participant will state understanding/return demonstration of topics presented.  Learning Barriers/Preferences: Learning Barriers/Preferences - 02/05/20 1243      Learning Barriers/Preferences   Learning Barriers  None    Learning Preferences  Audio;Computer/Internet;Group Instruction;Individual Instruction;Pictoral;Skilled Demonstration;Verbal Instruction;Video;Written Material       Education Topics: Risk Factor Reduction:  -Group instruction that is supported by a PowerPoint presentation. Instructor discusses the definition of a risk factor, different risk factors for pulmonary disease, and how the heart and lungs work together.     Nutrition for Pulmonary Patient:  -Group instruction provided by PowerPoint slides, verbal discussion, and written materials to support subject matter. The instructor gives an explanation and review of healthy diet recommendations, which includes a discussion on weight management, recommendations for fruit and vegetable consumption, as well as protein, fluid, caffeine, fiber, sodium, sugar, and alcohol. Tips for eating when patients are short of breath are discussed.   Pursed Lip Breathing:  -Group instruction that is supported by demonstration and informational handouts. Instructor discusses the benefits of pursed lip and diaphragmatic breathing and detailed demonstration on how to preform both.     Oxygen Safety:  -Group instruction provided by PowerPoint, verbal discussion, and written material to support subject matter. There is an overview of "What is Oxygen" and "Why do we need it".  Instructor also reviews how to create a safe environment  for oxygen use, the importance of using oxygen as prescribed, and the risks of noncompliance. There is a brief discussion on traveling with oxygen and resources the patient may utilize.   Oxygen Equipment:  -Group instruction provided by [facility] Staff utilizing handouts, written materials, and equipment demonstrations.   Signs and Symptoms:  -Group instruction provided by written material and verbal discussion to support  subject matter. Warning signs and symptoms of infection, stroke, and heart attack are reviewed and when to call the physician/911 reinforced. Tips for preventing the spread of infection discussed.   Advanced Directives:  -Group instruction provided by verbal instruction and written material to support subject matter. Instructor reviews Advanced Directive laws and proper instruction for filling out document.   Pulmonary Video:  -Group video education that reviews the importance of medication and oxygen compliance, exercise, good nutrition, pulmonary hygiene, and pursed lip and diaphragmatic breathing for the pulmonary patient.   Exercise for the Pulmonary Patient:  -Group instruction that is supported by a PowerPoint presentation. Instructor discusses benefits of exercise, core components of exercise, frequency, duration, and intensity of an exercise routine, importance of utilizing pulse oximetry during exercise, safety while exercising, and options of places to exercise outside of rehab.     Pulmonary Medications:  -Verbally interactive group education provided by instructor with focus on inhaled medications and proper administration.   Anatomy and Physiology of the Respiratory System and Intimacy:  -Group instruction provided by PowerPoint, verbal discussion, and written material to support subject matter. Instructor reviews respiratory cycle and anatomical components of the respiratory system and their functions. Instructor also reviews differences in obstructive  and restrictive respiratory diseases with examples of each. Intimacy, Sex, and Sexuality differences are reviewed with a discussion on how relationships can change when diagnosed with pulmonary disease. Common sexual concerns are reviewed.   MD DAY -A group question and answer session with a medical doctor that allows participants to ask questions that relate to their pulmonary disease state.   OTHER EDUCATION -Group or individual verbal, written, or video instructions that support the educational goals of the pulmonary rehab program.   Holiday Eating Survival Tips:  -Group instruction provided by PowerPoint slides, verbal discussion, and written materials to support subject matter. The instructor gives patients tips, tricks, and techniques to help them not only survive but enjoy the holidays despite the onslaught of food that accompanies the holidays.   Knowledge Questionnaire Score: Knowledge Questionnaire Score - 02/05/20 1243      Knowledge Questionnaire Score   Pre Score  patient did not complete       Core Components/Risk Factors/Patient Goals at Admission: Personal Goals and Risk Factors at Admission - 02/05/20 1243      Core Components/Risk Factors/Patient Goals on Admission   Improve shortness of breath with ADL's  Yes    Intervention  Provide education, individualized exercise plan and daily activity instruction to help decrease symptoms of SOB with activities of daily living.    Expected Outcomes  Short Term: Improve cardiorespiratory fitness to achieve a reduction of symptoms when performing ADLs;Long Term: Be able to perform more ADLs without symptoms or delay the onset of symptoms       Core Components/Risk Factors/Patient Goals Review:  Goals and Risk Factor Review    Row Name 02/05/20 1244             Core Components/Risk Factors/Patient Goals Review   Personal Goals Review  Increase knowledge of respiratory medications and ability to use respiratory devices  properly.;Improve shortness of breath with ADL's;Develop more efficient breathing techniques such as purse lipped breathing and diaphragmatic breathing and practicing self-pacing with activity.          Core Components/Risk Factors/Patient Goals at Discharge (Final Review):  Goals and Risk Factor Review - 02/05/20 1244      Core Components/Risk Factors/Patient Goals Review   Personal Goals Review  Increase  knowledge of respiratory medications and ability to use respiratory devices properly.;Improve shortness of breath with ADL's;Develop more efficient breathing techniques such as purse lipped breathing and diaphragmatic breathing and practicing self-pacing with activity.       ITP Comments:   Comments:

## 2020-02-05 NOTE — Progress Notes (Signed)
Katie Waller 74 y.o. female Pulmonary Rehab Orientation Note Patient arrived today in Cardiac and Pulmonary Rehab for orientation to Pulmonary Rehab. She was transported from [employer] via wheel chair by patient's daughter. She does carry portable oxygen. Per pt, she uses oxygen continuously at 3 liters/min.  Color good, skin warm and dry. Patient is oriented to time and place. Patient's medical history, psychosocial health, and medications reviewed. Psychosocial assessment reveals pt lives with their daughter. Pt is currently retired as an Biomedical engineer. Pt does not exhibit signs of depression. PHQ2/9 score 0/0. Pt shows good  coping skills with positive outlook . Will continue to monitor and evaluate psychosocial concerns while in pulmonary rehab. Physical assessment reveals heart rate is tachycardic, breath sounds clear to auscultation, no wheezes, rales, or rhonchi. Grip strength equal, strong. Distal pulses 3+ bilateral posterior tibial pulses present with 1+ left ankle edema. Patient reports she does take medications as prescribed. Patient states she follows a Regular diet. The patient reports no specific efforts to gain or lose weight.. Patient's weight will be monitored closely. Demonstration and practice of PLB using pulse oximeter. Patient able to return demonstration satisfactorily. Safety and hand hygiene in the exercise area reviewed with patient. Patient voices understanding of the information reviewed. Department expectations discussed with patient and achievable goals were set. The patient shows enthusiasm about attending the program and we look forward to working with this nice lady. The patient completed a 6 min walk test today, 02/05/2020 and will begin exercise on Tuesday, 02/11/20 in the 1000 exercise slot.  0900-1100

## 2020-02-11 ENCOUNTER — Other Ambulatory Visit: Payer: Self-pay

## 2020-02-11 ENCOUNTER — Encounter (HOSPITAL_COMMUNITY)
Admission: RE | Admit: 2020-02-11 | Discharge: 2020-02-11 | Disposition: A | Discharge status: Home / Self Care | Payer: Medicare Other | Source: Ambulatory Visit | Attending: Pulmonary Disease | Admitting: Pulmonary Disease

## 2020-02-11 DIAGNOSIS — J449 Chronic obstructive pulmonary disease, unspecified: Secondary | ICD-10-CM

## 2020-02-11 NOTE — Progress Notes (Signed)
Daily Session Note  Patient Details  Name: Katie Waller MRN: [id number] Date of Birth: [birth year] Referring Provider:     Pulmonary Rehab Walk Test from 02/05/2020 in [facility]  Referring Provider  Dr. Marshell Garfinkel MD      Encounter Date: 02/11/2020  Check In: Session Check In - 02/11/20 1005      Check-In   Supervising physician immediately available to respond to emergencies  [facility] immediately available    Physician(s)  Dr. Cruzita Lederer    Location  [facility]-Cardiac & Pulmonary Rehab    Staff Present  [name], RN, BSN;Lisa [name], RN;Jariah Tarkowski Kris Mouton, MS, CEP, Exercise Physiologist    Virtual Visit  No    Medication changes reported      No    Fall or balance concerns reported     No    Tobacco Cessation  No Change    Warm-up and Cool-down  Performed as group-led instruction    Resistance Training Performed  Yes    VAD Patient?  No    PAD/SET Patient?  No      Pain Assessment   Currently in Pain?  No/denies    Multiple Pain Sites  No       Capillary Blood Glucose: No results found for this or any previous visit (from the past 24 hour(s)).    Social History   Tobacco Use  Smoking Status Former Smoker  . Packs/day: 1.00  . Years: 55.00  . Pack years: 55.00  . Types: Cigarettes  . Start date: 07/23/2017  . Quit date: 07/23/2017  . Years since quitting: 2.5  Smokeless Tobacco Never Used    Goals Met:  Independence with exercise equipment Exercise tolerated well Strength training completed today  Goals Unmet:  Not Applicable  Comments: Service time is from 1048 to 1146    Dr. Fransico Him is Medical Director for Cardiac Rehab at [facility].

## 2020-02-13 ENCOUNTER — Other Ambulatory Visit: Payer: Self-pay

## 2020-02-13 ENCOUNTER — Encounter (HOSPITAL_COMMUNITY)
Admission: RE | Admit: 2020-02-13 | Discharge: 2020-02-13 | Disposition: A | Discharge status: Home / Self Care | Payer: Medicare Other | Source: Ambulatory Visit | Attending: Pulmonary Disease | Admitting: Pulmonary Disease

## 2020-02-13 DIAGNOSIS — J449 Chronic obstructive pulmonary disease, unspecified: Secondary | ICD-10-CM | POA: Diagnosis not present

## 2020-02-13 NOTE — Progress Notes (Signed)
Daily Session Note  Patient Details  Name: Katie Waller MRN: [id number] Date of Birth: 08-22-1946 Referring Provider:     Pulmonary Rehab Walk Test from 02/05/2020 in [place]  Referring Provider Dr. Marshell Garfinkel MD      Encounter Date: 02/13/2020  Check In:  Session Check In - 02/13/20 1012      Check-In   Supervising physician immediately available to respond to emergencies [facility] immediately available    Physician(s) Dr. Cruzita Lederer    Location [facility]    Staff Present [name], RN, BSN;[name] [name], RN;[name], MS, CEP, Exercise Physiologist    Virtual Visit No    Medication changes reported     No    Fall or balance concerns reported    No    Tobacco Cessation No Change    Warm-up and Cool-down Performed as group-led instruction    Resistance Training Performed Yes    VAD Patient? No    PAD/SET Patient? No      Pain Assessment   Currently in Pain? No/denies    Multiple Pain Sites No           Capillary Blood Glucose: No results found for this or any previous visit (from the past 24 hour(s)).    Social History   Tobacco Use  Smoking Status Former Smoker  . Packs/day: 1.00  . Years: 55.00  . Pack years: 55.00  . Types: Cigarettes  . Start date: 07/23/2017  . Quit date: 07/23/2017  . Years since quitting: 2.5  Smokeless Tobacco Never Used    Goals Met:  Exercise tolerated well No report of cardiac concerns or symptoms Strength training completed today  Goals Unmet:  Not Applicable  Comments: Service time is from 0945 to 1050    Dr. Fransico Him is Medical Director for Cardiac Rehab at [facility].

## 2020-02-18 ENCOUNTER — Other Ambulatory Visit: Payer: Self-pay

## 2020-02-18 ENCOUNTER — Encounter (HOSPITAL_COMMUNITY)
Admission: RE | Admit: 2020-02-18 | Discharge: 2020-02-18 | Disposition: A | Discharge status: Home / Self Care | Payer: Medicare Other | Source: Ambulatory Visit | Attending: Pulmonary Disease | Admitting: Pulmonary Disease

## 2020-02-18 VITALS — Wt 152.8 lb

## 2020-02-18 DIAGNOSIS — J449 Chronic obstructive pulmonary disease, unspecified: Secondary | ICD-10-CM

## 2020-02-18 NOTE — Progress Notes (Signed)
Daily Session Note  Patient Details  Name: Katie Waller MRN: [id number] Date of Birth: May 12, 1946 Referring Provider:     Pulmonary Rehab Walk Test from 02/05/2020 in [place]  Referring Provider Dr. Marshell Garfinkel MD      Encounter Date: 02/18/2020  Check In:  Session Check In - 02/18/20 1001      Check-In   Supervising physician immediately available to respond to emergencies [facility] immediately available    Physician(s) Dr. Cruzita Lederer    Location [facility]    Staff Present [name], RN, BSN;Kayin Osment [name], RN;[name], MS, CEP, Exercise Physiologist    Virtual Visit No    Medication changes reported     No    Fall or balance concerns reported    No    Tobacco Cessation No Change    Warm-up and Cool-down Performed as group-led instruction    Resistance Training Performed Yes    VAD Patient? No    PAD/SET Patient? No      Pain Assessment   Currently in Pain? No/denies    Multiple Pain Sites No           Capillary Blood Glucose: No results found for this or any previous visit (from the past 24 hour(s)).   Exercise Prescription Changes - 02/18/20 1100      Response to Exercise   Blood Pressure (Admit) 118/70    Blood Pressure (Exercise) 142/70    Blood Pressure (Exit) 114/76    Heart Rate (Admit) 102 bpm    Heart Rate (Exercise) 116 bpm    Heart Rate (Exit) 110 bpm    Oxygen Saturation (Admit) 99 %    Oxygen Saturation (Exercise) 99 %    Oxygen Saturation (Exit) 100 %    Rating of Perceived Exertion (Exercise) 13    Perceived Dyspnea (Exercise) 2    Duration Continue with 30 min of aerobic exercise without signs/symptoms of physical distress.    Intensity Other (comment)      Progression   Progression Continue to progress workloads to maintain intensity without signs/symptoms of physical distress.   40-80% if HRR     Resistance Training   Training Prescription Yes    Weight orange bands     Reps 10-15    Time 10 Minutes      Oxygen   Oxygen Continuous    Liters 3      NuStep   Level 2    SPM 80    Minutes 30    METs 2           Social History   Tobacco Use  Smoking Status Former Smoker  . Packs/day: 1.00  . Years: 55.00  . Pack years: 55.00  . Types: Cigarettes  . Start date: 07/23/2017  . Quit date: 07/23/2017  . Years since quitting: 2.5  Smokeless Tobacco Never Used    Goals Met:  Exercise tolerated well No report of cardiac concerns or symptoms Strength training completed today  Goals Unmet:  Not Applicable  Comments: Service time is from 0950 to 1055    Dr. Fransico Him is Medical Director for Cardiac Rehab at [facility].

## 2020-02-18 NOTE — Progress Notes (Signed)
Katie Waller 74 y.o. female Nutrition Note  Visit Diagnosis: Chronic obstructive pulmonary disease, unspecified COPD type (HCC)  Past Medical History:  Diagnosis Date  . Asthma   . Diabetes (HCC)   . Heart disease   . Hypertension      Medications reviewed.   Current Outpatient Medications:  .  albuterol (PROVENTIL HFA;VENTOLIN HFA) 108 (90 BASE) MCG/ACT inhaler, Inhale 2 puffs into the lungs every 6 (six) hours as needed for wheezing or shortness of breath., Disp: 1 Inhaler, Rfl: 0 .  amLODipine (NORVASC) 5 MG tablet, Take 5 mg by mouth every morning., Disp: , Rfl:  .  aspirin 81 MG chewable tablet, Chew 1 tablet (81 mg total) by mouth daily. (Patient taking differently: Chew 81 mg by mouth at bedtime. ), Disp: , Rfl:  .  benzonatate (TESSALON) 100 MG capsule, Take 100 mg by mouth 3 (three) times daily as needed for cough. , Disp: , Rfl:  .  budesonide-formoterol (SYMBICORT) 160-4.5 MCG/ACT inhaler, Inhale 2 puffs into the lungs 2 (two) times daily., Disp: 1 Inhaler, Rfl: 4 .  Calcium 600-200 MG-UNIT tablet, Take 1 tablet by mouth daily., Disp: , Rfl:  .  furosemide (LASIX) 40 MG tablet, Take 40 mg by mouth daily as needed for fluid or edema. , Disp: , Rfl:  .  gabapentin (NEURONTIN) 300 MG capsule, Take 300 mg by mouth at bedtime., Disp: , Rfl:  .  ipratropium-albuterol (DUONEB) 0.5-2.5 (3) MG/3ML SOLN, Take 3 mLs by nebulization 3 (three) times daily. DX: J44.1 (Patient taking differently: Take 3 mLs by nebulization as needed. DX: J44.1), Disp: 360 mL, Rfl: 3 .  LORazepam (ATIVAN) 1 MG tablet, Take 1 tablet (1 mg total) by mouth every 6 (six) hours as needed for anxiety., Disp: 30 tablet, Rfl: 0 .  losartan (COZAAR) 50 MG tablet, Take 50 mg by mouth daily., Disp: , Rfl:  .  Multiple Vitamin (MULTIVITAMIN) tablet, Take 1 tablet by mouth daily., Disp: , Rfl:  .  ondansetron (ZOFRAN ODT) 4 MG disintegrating tablet, Take 1 tablet (4 mg total) by mouth every 8 (eight) hours as needed  for nausea or vomiting., Disp: 5 tablet, Rfl: 0 .  tiotropium (SPIRIVA) 18 MCG inhalation capsule, Place 18 mcg into inhaler and inhale daily., Disp: , Rfl:  .  traZODone (DESYREL) 100 MG tablet, Take 1 tablet (100 mg total) by mouth at bedtime as needed for sleep. (Patient taking differently: Take 100 mg by mouth at bedtime. ), Disp: 10 tablet, Rfl: 0   Ht Readings from Last 1 Encounters:  02/05/20 5' 1.5" (1.562 m)     Wt Readings from Last 3 Encounters:  02/05/20 151 lb 10.8 oz (68.8 kg)  01/03/20 152 lb 12.8 oz (69.3 kg)  05/21/19 145 lb 1.9 oz (65.8 kg)     There is no height or weight on file to calculate BMI.   Social History   Tobacco Use  Smoking Status Former Smoker  . Packs/day: 1.00  . Years: 55.00  . Pack years: 55.00  . Types: Cigarettes  . Start date: 07/23/2017  . Quit date: 07/23/2017  . Years since quitting: 2.5  Smokeless Tobacco Never Used     Nutrition Note  Spoke with pt. Nutrition Plan and Nutrition Survey goals reviewed with pt.  Pt's daughter lives with her and grocery shops/prepares all the meals. Pt states daughter reads labels and helps her avoid high sodium foods. She eats small portions and always tries to incorporate veggies and proteins  with her meals  Based on diet recall, she is still eating high sodium foods often. We discussed label reading and choosing those foods less often.  She takes Lasix PRN which typically is M/W/F/Sat. She takes a daily weight and was able to verbalize protocol for when to call the doctor if she gains 3 lbs overnight.  Pt has Pre-diabetes. Last A1c indicates blood glucose well-controlled.   Pt with dx of CHF. Per discussion, pt does use canned/convenience foods often. Pt does add salt to food. Pt does not eat out frequently.   Pt expressed understanding of the information reviewed.    Nutrition Diagnosis ? Excessive sodium intake related to overconsumption of highly processed foods as evidenced by diet recall     Nutrition Intervention ? Pt's individual nutrition plan reviewed with pt. ? Benefits of adopting healthy diet reviewed with Rate My Plate survey   ? Continue client-centered nutrition education by RD, as part of interdisciplinary care.  Goal(s) ? Pt to build a healthy plate including vegetables, fruits, whole grains, and low-fat dairy products in a generally healthy meal plan. ? Pt to limit high sodium foods by reading labels   Plan:   Will provide client-centered nutrition education as part of interdisciplinary care  Monitor and evaluate progress toward nutrition goal with team.   Michaele Offer, MS, RDN, LDN

## 2020-02-19 NOTE — Progress Notes (Signed)
Pulmonary Individual Treatment Plan  Patient Details  Name: Katie Waller MRN: [id number] Date of Birth: 03/18/1946 Referring Provider:     Pulmonary Rehab Walk Test from 02/05/2020 in [place]  Referring Provider Dr. Marshell Garfinkel MD      Initial Encounter Date:    Pulmonary Rehab Walk Test from 02/05/2020 in [place]  Date 02/05/20      Visit Diagnosis: Chronic obstructive pulmonary disease, unspecified COPD type ([place])  Patient's Home Medications on Admission:   Current Outpatient Medications:  .  albuterol (PROVENTIL HFA;VENTOLIN HFA) 108 (90 BASE) MCG/ACT inhaler, Inhale 2 puffs into the lungs every 6 (six) hours as needed for wheezing or shortness of breath., Disp: 1 Inhaler, Rfl: 0 .  amLODipine (NORVASC) 5 MG tablet, Take 5 mg by mouth every morning., Disp: , Rfl:  .  aspirin 81 MG chewable tablet, Chew 1 tablet (81 mg total) by mouth daily. (Patient taking differently: Chew 81 mg by mouth at bedtime. ), Disp: , Rfl:  .  benzonatate (TESSALON) 100 MG capsule, Take 100 mg by mouth 3 (three) times daily as needed for cough. , Disp: , Rfl:  .  budesonide-formoterol (SYMBICORT) 160-4.5 MCG/ACT inhaler, Inhale 2 puffs into the lungs 2 (two) times daily., Disp: 1 Inhaler, Rfl: 4 .  Calcium 600-200 MG-UNIT tablet, Take 1 tablet by mouth daily., Disp: , Rfl:  .  furosemide (LASIX) 40 MG tablet, Take 40 mg by mouth daily as needed for fluid or edema. , Disp: , Rfl:  .  gabapentin (NEURONTIN) 300 MG capsule, Take 300 mg by mouth at bedtime., Disp: , Rfl:  .  ipratropium-albuterol (DUONEB) 0.5-2.5 (3) MG/3ML SOLN, Take 3 mLs by nebulization 3 (three) times daily. DX: J44.1 (Patient taking differently: Take 3 mLs by nebulization as needed. DX: J44.1), Disp: 360 mL, Rfl: 3 .  LORazepam (ATIVAN) 1 MG tablet, Take 1 tablet (1 mg total) by mouth every 6 (six) hours as needed for anxiety., Disp: 30 tablet, Rfl: 0 .  losartan  (COZAAR) 50 MG tablet, Take 50 mg by mouth daily., Disp: , Rfl:  .  Multiple Vitamin (MULTIVITAMIN) tablet, Take 1 tablet by mouth daily., Disp: , Rfl:  .  ondansetron (ZOFRAN ODT) 4 MG disintegrating tablet, Take 1 tablet (4 mg total) by mouth every 8 (eight) hours as needed for nausea or vomiting., Disp: 5 tablet, Rfl: 0 .  tiotropium (SPIRIVA) 18 MCG inhalation capsule, Place 18 mcg into inhaler and inhale daily., Disp: , Rfl:  .  traZODone (DESYREL) 100 MG tablet, Take 1 tablet (100 mg total) by mouth at bedtime as needed for sleep. (Patient taking differently: Take 100 mg by mouth at bedtime. ), Disp: 10 tablet, Rfl: 0  Past Medical History: Past Medical History:  Diagnosis Date  . Asthma   . Diabetes ([place])   . Heart disease   . Hypertension     Tobacco Use: Social History   Tobacco Use  Smoking Status Former Smoker  . Packs/day: 1.00  . Years: 55.00  . Pack years: 55.00  . Types: Cigarettes  . Start date: 07/23/2017  . Quit date: 07/23/2017  . Years since quitting: 2.5  Smokeless Tobacco Never Used    Labs: Recent Review Scientist, physiological    Labs for ITP Cardiac and Pulmonary Rehab Latest Ref Rng & Units 09/17/2013 09/18/2013 07/26/2017 07/31/2017   Cholestrol 0 - 200 mg/dL - 169 - -   LDLCALC 0 - 99 mg/dL -  66 - -   HDL >39 mg/dL - 76 - -   Trlycerides <150 mg/dL - 134 - -   Hemoglobin A1c <5.7 % 6.1(H) - - -   PHART 7.35 - 7.45 - - 7.310(L) 7.334(L)   PCO2ART 32 - 48 mmHg - - 92.7(HH) 89.0(HH)   HCO3 20.0 - 28.0 mmol/L - - 45.3(H) 46.0(H)   O2SAT % - - 81.4 90.4      Capillary Blood Glucose: No results found for: GLUCAP   Pulmonary Assessment Scores:  Pulmonary Assessment Scores    Row Name 02/05/20 1105 02/05/20 1241       ADL UCSD   ADL Phase -- Entry    SOB Score total -- 62      CAT Score   CAT Score -- 27      mMRC Score   mMRC Score 4 4          UCSD: Self-administered rating of dyspnea associated with activities of daily living  (ADLs) 6-point scale (0 = "not at all" to 5 = "maximal or unable to do because of breathlessness")  Scoring Scores range from 0 to 120.  Minimally important difference is 5 units  CAT: CAT can identify the health impairment of COPD patients and is better correlated with disease progression.  CAT has a scoring range of zero to 40. The CAT score is classified into four groups of low (less than 10), medium (10 - 20), high (21-30) and very high (31-40) based on the impact level of disease on health status. A CAT score over 10 suggests significant symptoms.  A worsening CAT score could be explained by an exacerbation, poor medication adherence, poor inhaler technique, or progression of COPD or comorbid conditions.  CAT MCID is 2 points  mMRC: mMRC (Modified Medical Research Council) Dyspnea Scale is used to assess the degree of baseline functional disability in patients of respiratory disease due to dyspnea. No minimal important difference is established. A decrease in score of 1 point or greater is considered a positive change.   Pulmonary Function Assessment:  Pulmonary Function Assessment - 02/05/20 1241      Post Bronchodilator Spirometry Results   FEV1% 36 %      Breath   Bilateral Breath Sounds Clear;Decreased    Shortness of Breath Yes;Limiting activity           Exercise Target Goals: Exercise Program Goal: Individual exercise prescription set using results from initial 6 min walk test and THRR while considering  patient's activity barriers and safety.   Exercise Prescription Goal: Initial exercise prescription builds to 30-45 minutes a day of aerobic activity, 2-3 days per week.  Home exercise guidelines will be given to patient during program as part of exercise prescription that the participant will acknowledge.  Activity Barriers & Risk Stratification:  Activity Barriers & Cardiac Risk Stratification - 02/05/20 0954      Activity Barriers & Cardiac Risk Stratification    Activity Barriers Deconditioning;Muscular Weakness;Shortness of Breath;Arthritis;History of Falls;Assistive Device           6 Minute Walk:  6 Minute Walk    Row Name 02/05/20 1058         6 Minute Walk   Distance 904 feet     Walk Time 6 minutes  Total Walk Time: 5 minutes, 18 seconds     # of Rest Breaks 1     MPH 1.7     METS 2.3     RPE 13  Perceived Dyspnea  1     VO2 Peak 8.1     Symptoms Yes (comment)     Comments Mild SOB, Took 1 42 second rest break halfway through walk test     Resting HR 99 bpm     Resting BP 130/80     Resting Oxygen Saturation  100 %     Exercise Oxygen Saturation  during 6 min walk 95 %     Max Ex. HR 124 bpm     Max Ex. BP 136/84     2 Minute Post BP 128/76            Oxygen Initial Assessment:  Oxygen Initial Assessment - 02/05/20 0959      Home Oxygen   Home Oxygen Device E-Tanks;Home Concentrator;Portable Concentrator    Sleep Oxygen Prescription Continuous    Liters per minute 3    Home Exercise Oxygen Prescription Pulsed    Liters per minute 3    Home at Rest Exercise Oxygen Prescription Continuous    Liters per minute 3    Compliance with Home Oxygen Use Yes           Oxygen Re-Evaluation:  Oxygen Re-Evaluation    Row Name 02/18/20 1459             Program Oxygen Prescription   Program Oxygen Prescription Continuous       Liters per minute 3         Home Oxygen   Home Oxygen Device E-Tanks;Home Concentrator;Portable Concentrator       Sleep Oxygen Prescription Continuous       Liters per minute 3       Home Exercise Oxygen Prescription Pulsed       Liters per minute 3       Home at Rest Exercise Oxygen Prescription Continuous       Liters per minute 3       Compliance with Home Oxygen Use Yes         Goals/Expected Outcomes   Short Term Goals To learn and exhibit compliance with exercise, home and travel O2 prescription;To learn and understand importance of monitoring SPO2 with pulse oximeter and  demonstrate accurate use of the pulse oximeter.;To learn and understand importance of maintaining oxygen saturations>88%;To learn and demonstrate proper pursed lip breathing techniques or other breathing techniques.;To learn and demonstrate proper use of respiratory medications       Long  Term Goals Exhibits compliance with exercise, home and travel O2 prescription;Verbalizes importance of monitoring SPO2 with pulse oximeter and return demonstration;Maintenance of O2 saturations>88%;Exhibits proper breathing techniques, such as pursed lip breathing or other method taught during program session;Compliance with respiratory medication;Demonstrates proper use of MDI's       Goals/Expected Outcomes compliance              Oxygen Discharge (Final Oxygen Re-Evaluation):  Oxygen Re-Evaluation - 02/18/20 1459      Program Oxygen Prescription   Program Oxygen Prescription Continuous    Liters per minute 3      Home Oxygen   Home Oxygen Device E-Tanks;Home Concentrator;Portable Concentrator    Sleep Oxygen Prescription Continuous    Liters per minute 3    Home Exercise Oxygen Prescription Pulsed    Liters per minute 3    Home at Rest Exercise Oxygen Prescription Continuous    Liters per minute 3    Compliance with Home Oxygen Use Yes      Goals/Expected Outcomes   Short  Term Goals To learn and exhibit compliance with exercise, home and travel O2 prescription;To learn and understand importance of monitoring SPO2 with pulse oximeter and demonstrate accurate use of the pulse oximeter.;To learn and understand importance of maintaining oxygen saturations>88%;To learn and demonstrate proper pursed lip breathing techniques or other breathing techniques.;To learn and demonstrate proper use of respiratory medications    Long  Term Goals Exhibits compliance with exercise, home and travel O2 prescription;Verbalizes importance of monitoring SPO2 with pulse oximeter and return demonstration;Maintenance of O2  saturations>88%;Exhibits proper breathing techniques, such as pursed lip breathing or other method taught during program session;Compliance with respiratory medication;Demonstrates proper use of MDI's    Goals/Expected Outcomes compliance           Initial Exercise Prescription:  Initial Exercise Prescription - 02/05/20 1100      Date of Initial Exercise RX and Referring Provider   Date 02/05/20    Referring Provider Dr. Marshell Garfinkel MD    Expected Discharge Date 04/03/20      Oxygen   Oxygen Continuous    Liters 3      NuStep   Level 2    SPM 75    Minutes 30    METs 1.5      Prescription Details   Frequency (times per week) 2x    Duration Progress to 10 minutes continuous walking  at current work load and total walking time to 30-45 min      Intensity   THRR 40-80% of Max Heartrate 60-119    Ratings of Perceived Exertion 11-13    Perceived Dyspnea 0-4      Progression   Progression Continue progressive overload as per policy without signs/symptoms or physical distress.      Resistance Training   Training Prescription Yes    Weight Orange Band    Reps 10-15           Perform Capillary Blood Glucose checks as needed.  Exercise Prescription Changes:  Exercise Prescription Changes    Row Name 02/18/20 1100             Response to Exercise   Blood Pressure (Admit) 118/70       Blood Pressure (Exercise) 142/70       Blood Pressure (Exit) 114/76       Heart Rate (Admit) 102 bpm       Heart Rate (Exercise) 116 bpm       Heart Rate (Exit) 110 bpm       Oxygen Saturation (Admit) 99 %       Oxygen Saturation (Exercise) 99 %       Oxygen Saturation (Exit) 100 %       Rating of Perceived Exertion (Exercise) 13       Perceived Dyspnea (Exercise) 2       Duration Continue with 30 min of aerobic exercise without signs/symptoms of physical distress.       Intensity Other (comment)         Progression   Progression Continue to progress workloads to maintain  intensity without signs/symptoms of physical distress.  40-80% if HRR         Resistance Training   Training Prescription Yes       Weight orange bands       Reps 10-15       Time 10 Minutes         Oxygen   Oxygen Continuous       Liters 3  NuStep   Level 2       SPM 80       Minutes 30       METs 2              Exercise Comments:  Exercise Comments    Row Name 02/11/20 1204           Exercise Comments Pt completed first day of exercise. Tolerated well. No complaints.              Exercise Goals and Review:  Exercise Goals    Row Name 02/05/20 1105 02/18/20 1500           Exercise Goals   Increase Physical Activity Yes Yes      Intervention Provide advice, education, support and counseling about physical activity/exercise needs.;Develop an individualized exercise prescription for aerobic and resistive training based on initial evaluation findings, risk stratification, comorbidities and participant's personal goals. Provide advice, education, support and counseling about physical activity/exercise needs.;Develop an individualized exercise prescription for aerobic and resistive training based on initial evaluation findings, risk stratification, comorbidities and participant's personal goals.      Expected Outcomes Short Term: Attend rehab on a regular basis to increase amount of physical activity.;Long Term: Add in home exercise to make exercise part of routine and to increase amount of physical activity.;Long Term: Exercising regularly at least 3-5 days a week. Short Term: Attend rehab on a regular basis to increase amount of physical activity.;Long Term: Add in home exercise to make exercise part of routine and to increase amount of physical activity.;Long Term: Exercising regularly at least 3-5 days a week.      Increase Strength and Stamina Yes Yes      Intervention Provide advice, education, support and counseling about physical activity/exercise  needs.;Develop an individualized exercise prescription for aerobic and resistive training based on initial evaluation findings, risk stratification, comorbidities and participant's personal goals. Provide advice, education, support and counseling about physical activity/exercise needs.;Develop an individualized exercise prescription for aerobic and resistive training based on initial evaluation findings, risk stratification, comorbidities and participant's personal goals.      Expected Outcomes Short Term: Perform resistance training exercises routinely during rehab and add in resistance training at home;Short Term: Increase workloads from initial exercise prescription for resistance, speed, and METs.;Long Term: Improve cardiorespiratory fitness, muscular endurance and strength as measured by increased METs and functional capacity ( ) Short Term: Perform resistance training exercises routinely during rehab and add in resistance training at home;Short Term: Increase workloads from initial exercise prescription for resistance, speed, and METs.;Long Term: Improve cardiorespiratory fitness, muscular endurance and strength as measured by increased METs and functional capacity ( )      Able to understand and use rate of perceived exertion (RPE) scale Yes Yes      Intervention Provide education and explanation on how to use RPE scale Provide education and explanation on how to use RPE scale      Expected Outcomes Short Term: Able to use RPE daily in rehab to express subjective intensity level;Long Term:  Able to use RPE to guide intensity level when exercising independently Short Term: Able to use RPE daily in rehab to express subjective intensity level;Long Term:  Able to use RPE to guide intensity level when exercising independently      Able to understand and use Dyspnea scale Yes Yes      Intervention Provide education and explanation on how to use Dyspnea scale Provide education and explanation on how to  use Dyspnea scale      Expected Outcomes Short Term: Able to use Dyspnea scale daily in rehab to express subjective sense of shortness of breath during exertion;Long Term: Able to use Dyspnea scale to guide intensity level when exercising independently Short Term: Able to use Dyspnea scale daily in rehab to express subjective sense of shortness of breath during exertion;Long Term: Able to use Dyspnea scale to guide intensity level when exercising independently      Knowledge and understanding of Target Heart Rate Range (THRR) Yes Yes      Intervention Provide education and explanation of THRR including how the numbers were predicted and where they are located for reference Provide education and explanation of THRR including how the numbers were predicted and where they are located for reference      Expected Outcomes Short Term: Able to state/look up THRR;Long Term: Able to use THRR to govern intensity when exercising independently;Short Term: Able to use daily as guideline for intensity in rehab Short Term: Able to state/look up THRR;Long Term: Able to use THRR to govern intensity when exercising independently;Short Term: Able to use daily as guideline for intensity in rehab      Understanding of Exercise Prescription Yes Yes      Intervention Provide education, explanation, and written materials on patient's individual exercise prescription Provide education, explanation, and written materials on patient's individual exercise prescription      Expected Outcomes Short Term: Able to explain program exercise prescription;Long Term: Able to explain home exercise prescription to exercise independently Short Term: Able to explain program exercise prescription;Long Term: Able to explain home exercise prescription to exercise independently             Exercise Goals Re-Evaluation :  Exercise Goals Re-Evaluation    Row Name 02/18/20 1500             Exercise Goal Re-Evaluation   Exercise Goals Review  Increase Physical Activity;Increase Strength and Stamina;Able to understand and use rate of perceived exertion (RPE) scale;Able to understand and use Dyspnea scale;Knowledge and understanding of Target Heart Rate Range (THRR);Understanding of Exercise Prescription       Comments Pt has completed 3 exercise sessions. She has a positive attitude and wants to become more active. She is currently exercising at 2.0 METs on the stepper. Will continue to monitor and progress as able.       Expected Outcomes Through exercise at rehab and at home, the patient will decrease shortness of breath with daily activities and feel confident in carrying out an exercise regime at home.              Discharge Exercise Prescription (Final Exercise Prescription Changes):  Exercise Prescription Changes - 02/18/20 1100      Response to Exercise   Blood Pressure (Admit) 118/70    Blood Pressure (Exercise) 142/70    Blood Pressure (Exit) 114/76    Heart Rate (Admit) 102 bpm    Heart Rate (Exercise) 116 bpm    Heart Rate (Exit) 110 bpm    Oxygen Saturation (Admit) 99 %    Oxygen Saturation (Exercise) 99 %    Oxygen Saturation (Exit) 100 %    Rating of Perceived Exertion (Exercise) 13    Perceived Dyspnea (Exercise) 2    Duration Continue with 30 min of aerobic exercise without signs/symptoms of physical distress.    Intensity Other (comment)      Progression   Progression Continue to progress workloads to maintain intensity without  signs/symptoms of physical distress.   40-80% if HRR     Resistance Training   Training Prescription Yes    Weight orange bands    Reps 10-15    Time 10 Minutes      Oxygen   Oxygen Continuous    Liters 3      NuStep   Level 2    SPM 80    Minutes 30    METs 2           Nutrition:  Target Goals: Understanding of nutrition guidelines, daily intake of sodium '1500mg'$ , cholesterol '200mg'$ , calories 30% from fat and 7% or less from saturated fats, daily to have 5 or more  servings of fruits and vegetables.  Biometrics:  Pre Biometrics - 02/05/20 0955      Pre Biometrics   Height 5' 1.5" (1.562 m)    Weight 68.8 kg    BMI (Calculated) 28.2    Grip Strength 18 kg            Nutrition Therapy Plan and Nutrition Goals:  Nutrition Therapy & Goals - 02/18/20 1110      Nutrition Therapy   Diet Low Sodium      Personal Nutrition Goals   Nutrition Goal Pt to limit high sodium foods by reading labels    Personal Goal #2 Pt to build a healthy plate including vegetables, fruits, whole grains, and low-fat dairy products in a generally healthy meal plan.      Intervention Plan   Intervention Prescribe, educate and counsel regarding individualized specific dietary modifications aiming towards targeted core components such as weight, hypertension, lipid management, diabetes, heart failure and other comorbidities.    Expected Outcomes Short Term Goal: Understand basic principles of dietary content, such as calories, fat, sodium, cholesterol and nutrients.           Nutrition Assessments:  Nutrition Assessments - 02/18/20 1111      Rate Your Plate Scores   Pre Score 43           Nutrition Goals Re-Evaluation:  Nutrition Goals Re-Evaluation    Row Name 02/18/20 1111             Goals   Current Weight 151 lb (68.5 kg)       Nutrition Goal Pt to limit high sodium foods by reading labels         Personal Goal #2 Re-Evaluation   Personal Goal #2 Pt to build a healthy plate including vegetables, fruits, whole grains, and low-fat dairy products in a generally healthy meal plan.              Nutrition Goals Discharge (Final Nutrition Goals Re-Evaluation):  Nutrition Goals Re-Evaluation - 02/18/20 1111      Goals   Current Weight 151 lb (68.5 kg)    Nutrition Goal Pt to limit high sodium foods by reading labels      Personal Goal #2 Re-Evaluation   Personal Goal #2 Pt to build a healthy plate including vegetables, fruits, whole grains, and  low-fat dairy products in a generally healthy meal plan.           Psychosocial: Target Goals: Acknowledge presence or absence of significant depression and/or stress, maximize coping skills, provide positive support system. Participant is able to verbalize types and ability to use techniques and skills needed for reducing stress and depression.  Initial Review & Psychosocial Screening:  Initial Psych Review & Screening - 02/05/20 1000  Initial Review   Current issues with None Identified      Family Dynamics   Good Support System? Yes      Barriers   Psychosocial barriers to participate in program There are no identifiable barriers or psychosocial needs.      Screening Interventions   Interventions Encouraged to exercise           Quality of Life Scores:  Scores of 19 and below usually indicate a poorer quality of life in these areas.  A difference of  2-3 points is a clinically meaningful difference.  A difference of 2-3 points in the total score of the Quality of Life Index has been associated with significant improvement in overall quality of life, self-image, physical symptoms, and general health in studies assessing change in quality of life.  PHQ-9: Recent Review Flowsheet Data    Depression screen [facility] 2/9 02/05/2020   Decreased Interest 0   Down, Depressed, Hopeless 0   PHQ - 2 Score 0   Altered sleeping 0   Tired, decreased energy 0   Change in appetite 0   Feeling bad or failure about yourself  0   Trouble concentrating 0   Moving slowly or fidgety/restless 0   Suicidal thoughts 0   PHQ-9 Score 0   Difficult doing work/chores Not difficult at all     Interpretation of Total Score  Total Score Depression Severity:  1-4 = Minimal depression, 5-9 = Mild depression, 10-14 = Moderate depression, 15-19 = Moderately severe depression, 20-27 = Severe depression   Psychosocial Evaluation and Intervention:  Psychosocial Evaluation - 02/05/20 1242       Psychosocial Evaluation & Interventions   Continue Psychosocial Services  No Follow up required           Psychosocial Re-Evaluation:  Psychosocial Re-Evaluation    [place] Name 02/17/20 0931             Psychosocial Re-Evaluation   Current issues with None Identified       Comments No barriers or psychosocial concerns identified at this time.       Expected Outcomes For patient to continue to be free of barriers or psychosocial concerns while participating in pulmonary rehab.       Interventions Encouraged to attend Pulmonary Rehabilitation for the exercise       Continue Psychosocial Services  No Follow up required              Psychosocial Discharge (Final Psychosocial Re-Evaluation):  Psychosocial Re-Evaluation - 02/17/20 0931      Psychosocial Re-Evaluation   Current issues with None Identified    Comments No barriers or psychosocial concerns identified at this time.    Expected Outcomes For patient to continue to be free of barriers or psychosocial concerns while participating in pulmonary rehab.    Interventions Encouraged to attend Pulmonary Rehabilitation for the exercise    Continue Psychosocial Services  No Follow up required           Education: Education Goals: Education classes will be provided on a weekly basis, covering required topics. Participant will state understanding/return demonstration of topics presented.  Learning Barriers/Preferences:  Learning Barriers/Preferences - 02/05/20 1243      Learning Barriers/Preferences   Learning Barriers None    Learning Preferences Audio;Computer/Internet;Group Instruction;Individual Instruction;Pictoral;Skilled Demonstration;Verbal Instruction;Video;Written Material           Education Topics: Risk Factor Reduction:  -Group instruction that is supported by a PowerPoint presentation. Instructor discusses the definition  of a risk factor, different risk factors for pulmonary disease, and how the heart and  lungs work together.     Nutrition for Pulmonary Patient:  -Group instruction provided by PowerPoint slides, verbal discussion, and written materials to support subject matter. The instructor gives an explanation and review of healthy diet recommendations, which includes a discussion on weight management, recommendations for fruit and vegetable consumption, as well as protein, fluid, caffeine, fiber, sodium, sugar, and alcohol. Tips for eating when patients are short of breath are discussed.   Pursed Lip Breathing:  -Group instruction that is supported by demonstration and informational handouts. Instructor discusses the benefits of pursed lip and diaphragmatic breathing and detailed demonstration on how to preform both.     Oxygen Safety:  -Group instruction provided by PowerPoint, verbal discussion, and written material to support subject matter. There is an overview of "What is Oxygen" and "Why do we need it".  Instructor also reviews how to create a safe environment for oxygen use, the importance of using oxygen as prescribed, and the risks of noncompliance. There is a brief discussion on traveling with oxygen and resources the patient may utilize.   Oxygen Equipment:  -Group instruction provided by [facility] Staff utilizing handouts, written materials, and equipment demonstrations.   Signs and Symptoms:  -Group instruction provided by written material and verbal discussion to support subject matter. Warning signs and symptoms of infection, stroke, and heart attack are reviewed and when to call the physician/911 reinforced. Tips for preventing the spread of infection discussed.   Advanced Directives:  -Group instruction provided by verbal instruction and written material to support subject matter. Instructor reviews Advanced Directive laws and proper instruction for filling out document.   Pulmonary Video:  -Group video education that reviews the importance of medication and oxygen  compliance, exercise, good nutrition, pulmonary hygiene, and pursed lip and diaphragmatic breathing for the pulmonary patient.   Exercise for the Pulmonary Patient:  -Group instruction that is supported by a PowerPoint presentation. Instructor discusses benefits of exercise, core components of exercise, frequency, duration, and intensity of an exercise routine, importance of utilizing pulse oximetry during exercise, safety while exercising, and options of places to exercise outside of rehab.     Pulmonary Medications:  -Verbally interactive group education provided by instructor with focus on inhaled medications and proper administration.   PULMONARY REHAB CHRONIC OBSTRUCTIVE PULMONARY DISEASE from 02/11/2020 in [place]  Date 02/11/20  Educator handout  Instruction Review Code 2- Demonstrated Understanding      Anatomy and Physiology of the Respiratory System and Intimacy:  -Group instruction provided by PowerPoint, verbal discussion, and written material to support subject matter. Instructor reviews respiratory cycle and anatomical components of the respiratory system and their functions. Instructor also reviews differences in obstructive and restrictive respiratory diseases with examples of each. Intimacy, Sex, and Sexuality differences are reviewed with a discussion on how relationships can change when diagnosed with pulmonary disease. Common sexual concerns are reviewed.   MD DAY -A group question and answer session with a medical doctor that allows participants to ask questions that relate to their pulmonary disease state.   OTHER EDUCATION -Group or individual verbal, written, or video instructions that support the educational goals of the pulmonary rehab program.   Holiday Eating Survival Tips:  -Group instruction provided by PowerPoint slides, verbal discussion, and written materials to support subject matter. The instructor gives patients tips,  tricks, and techniques to help them not only survive but  enjoy the holidays despite the onslaught of food that accompanies the holidays.   Knowledge Questionnaire Score:  Knowledge Questionnaire Score - 02/05/20 1243      Knowledge Questionnaire Score   Pre Score patient did not complete           Core Components/Risk Factors/Patient Goals at Admission:  Personal Goals and Risk Factors at Admission - 02/05/20 1243      Core Components/Risk Factors/Patient Goals on Admission   Improve shortness of breath with ADL's Yes    Intervention Provide education, individualized exercise plan and daily activity instruction to help decrease symptoms of SOB with activities of daily living.    Expected Outcomes Short Term: Improve cardiorespiratory fitness to achieve a reduction of symptoms when performing ADLs;Long Term: Be able to perform more ADLs without symptoms or delay the onset of symptoms           Core Components/Risk Factors/Patient Goals Review:   Goals and Risk Factor Review    Row Name 02/05/20 1244 02/17/20 0932           Core Components/Risk Factors/Patient Goals Review   Personal Goals Review Increase knowledge of respiratory medications and ability to use respiratory devices properly.;Improve shortness of breath with ADL's;Develop more efficient breathing techniques such as purse lipped breathing and diaphragmatic breathing and practicing self-pacing with activity. Increase knowledge of respiratory medications and ability to use respiratory devices properly.;Improve shortness of breath with ADL's;Develop more efficient breathing techniques such as purse lipped breathing and diaphragmatic breathing and practicing self-pacing with activity.      Review -- Celena just started program, has attended 2 exercise sessions, too early to have met program goals.      Expected Outcomes -- See admission goals.             Core Components/Risk Factors/Patient Goals at Discharge (Final  Review):   Goals and Risk Factor Review - 02/17/20 0932      Core Components/Risk Factors/Patient Goals Review   Personal Goals Review Increase knowledge of respiratory medications and ability to use respiratory devices properly.;Improve shortness of breath with ADL's;Develop more efficient breathing techniques such as purse lipped breathing and diaphragmatic breathing and practicing self-pacing with activity.    Review Tiawana just started program, has attended 2 exercise sessions, too early to have met program goals.    Expected Outcomes See admission goals.           ITP Comments:   Comments: ITP REVIEW Pt is making expected progress toward pulmonary rehab goals after completing 3 sessions. Recommend continued exercise, life style modification, education, and utilization of breathing techniques to increase stamina and strength and decrease shortness of breath with exertion.

## 2020-02-20 ENCOUNTER — Other Ambulatory Visit: Payer: Self-pay

## 2020-02-20 ENCOUNTER — Encounter (HOSPITAL_COMMUNITY)
Admission: RE | Admit: 2020-02-20 | Discharge: 2020-02-20 | Disposition: A | Discharge status: Home / Self Care | Payer: Medicare Other | Source: Ambulatory Visit | Attending: Pulmonary Disease | Admitting: Pulmonary Disease

## 2020-02-20 DIAGNOSIS — J449 Chronic obstructive pulmonary disease, unspecified: Secondary | ICD-10-CM

## 2020-02-20 NOTE — Progress Notes (Signed)
Daily Session Note  Patient Details  Name: LYRICA MCCLARTY MRN: [id number] Date of Birth: 13-Feb-1946 Referring Provider:     Pulmonary Rehab Walk Test from 02/05/2020 in [place]  Referring Provider Dr. Marshell Garfinkel MD      Encounter Date: 02/20/2020  Check In:  Session Check In - 02/20/20 0949      Check-In   Supervising physician immediately available to respond to emergencies See telemetry face sheet for immediately available ER MD    Physician(s) Dr.E Rodena Piety    Location [facility]    Staff Present Rodney Langton, RN;Joan Leonia Reeves, RN, BSN;Other    Virtual Visit No    Medication changes reported     No    Fall or balance concerns reported    No    Tobacco Cessation No Change    Warm-up and Cool-down Performed on first and last piece of equipment    Resistance Training Performed Yes    VAD Patient? No    PAD/SET Patient? No      Pain Assessment   Currently in Pain? No/denies    Multiple Pain Sites No           Capillary Blood Glucose: No results found for this or any previous visit (from the past 24 hour(s)).    Social History   Tobacco Use  Smoking Status Former Smoker  . Packs/day: 1.00  . Years: 55.00  . Pack years: 55.00  . Types: Cigarettes  . Start date: 07/23/2017  . Quit date: 07/23/2017  . Years since quitting: 2.5  Smokeless Tobacco Never Used    Goals Met:  Exercise tolerated well No report of cardiac concerns or symptoms Strength training completed today  Goals Unmet:  Not Applicable  Comments: Service time is from 0945 to 1055    Dr. Fransico Him is Medical Director for Cardiac Rehab at [facility].

## 2020-02-25 ENCOUNTER — Other Ambulatory Visit: Payer: Self-pay

## 2020-02-25 ENCOUNTER — Encounter (HOSPITAL_COMMUNITY)
Admission: RE | Admit: 2020-02-25 | Discharge: 2020-02-25 | Disposition: A | Discharge status: Home / Self Care | Payer: Medicare Other | Source: Ambulatory Visit | Attending: Pulmonary Disease | Admitting: Pulmonary Disease

## 2020-02-25 DIAGNOSIS — J449 Chronic obstructive pulmonary disease, unspecified: Secondary | ICD-10-CM | POA: Diagnosis not present

## 2020-02-25 NOTE — Progress Notes (Signed)
Daily Session Note  Patient Details  Name: Katie Waller MRN: [id number] Date of Birth: 1946/06/20 Referring Provider:     Pulmonary Rehab Walk Test from 02/05/2020 in [place]  Referring Provider Dr. Marshell Garfinkel MD      Encounter Date: 02/25/2020  Check In:  Session Check In - 02/25/20 0949      Check-In   Supervising physician immediately available to respond to emergencies [facility] immediately available    Physician(s) Dr. Rodena Piety    Location [facility]    Staff Present [name], RN, BSN;Kenneith Stief [name], RN;Jessica Hassell Done, MS, ACSM-CEP, Exercise Physiologist    Virtual Visit No    Medication changes reported     No    Fall or balance concerns reported    No    Tobacco Cessation No Change    Warm-up and Cool-down Performed on first and last piece of equipment    Resistance Training Performed Yes    VAD Patient? No    PAD/SET Patient? No      Pain Assessment   Currently in Pain? No/denies    Multiple Pain Sites No           Capillary Blood Glucose: No results found for this or any previous visit (from the past 24 hour(s)).    Social History   Tobacco Use  Smoking Status Former Smoker  . Packs/day: 1.00  . Years: 55.00  . Pack years: 55.00  . Types: Cigarettes  . Start date: 07/23/2017  . Quit date: 07/23/2017  . Years since quitting: 2.5  Smokeless Tobacco Never Used    Goals Met:  Exercise tolerated well No report of cardiac concerns or symptoms Strength training completed today  Goals Unmet:  Not Applicable  Comments: Service time is from 0945 to 71    Dr. Fransico Him is Medical Director for Cardiac Rehab at [facility].

## 2020-02-27 ENCOUNTER — Other Ambulatory Visit: Payer: Self-pay

## 2020-02-27 ENCOUNTER — Encounter (HOSPITAL_COMMUNITY)
Admission: RE | Admit: 2020-02-27 | Discharge: 2020-02-27 | Disposition: A | Discharge status: Home / Self Care | Payer: Medicare Other | Source: Ambulatory Visit | Attending: Pulmonary Disease | Admitting: Pulmonary Disease

## 2020-02-27 DIAGNOSIS — J449 Chronic obstructive pulmonary disease, unspecified: Secondary | ICD-10-CM | POA: Diagnosis not present

## 2020-02-27 NOTE — Progress Notes (Signed)
Daily Session Note  Patient Details  Name: Katie Waller MRN: [id number] Date of Birth: 12-25-1945 Referring Provider:     Pulmonary Rehab Walk Test from 02/05/2020 in [place]  Referring Provider Dr. Marshell Garfinkel MD      Encounter Date: 02/27/2020  Check In:  Session Check In - 02/27/20 0947      Check-In   Supervising physician immediately available to respond to emergencies [facility] immediately available    Physician(s) Dr. Rodena Piety    [facility]    Staff Present [name], RN, Bjorn Loser, MS, CEP, Exercise Physiologist;Billye Pickerel Jani Gravel, MS, ACSM-CEP, Exercise Physiologist    Virtual Visit No    Medication changes reported     No    Fall or balance concerns reported    No    Tobacco Cessation No Change    Warm-up and Cool-down Performed on first and last piece of equipment    Resistance Training Performed Yes    VAD Patient? No    PAD/SET Patient? No      Pain Assessment   Currently in Pain? No/denies    Multiple Pain Sites No           Capillary Blood Glucose: No results found for this or any previous visit (from the past 24 hour(s)).    Social History   Tobacco Use  Smoking Status Former Smoker  . Packs/day: 1.00  . Years: 55.00  . Pack years: 55.00  . Types: Cigarettes  . Start date: 07/23/2017  . Quit date: 07/23/2017  . Years since quitting: 2.6  Smokeless Tobacco Never Used    Goals Met:  Exercise tolerated well No report of cardiac concerns or symptoms Strength training completed today  Goals Unmet:  Not Applicable  Comments: Service time is from 0945 to [place]   Dr. Fransico Him is Medical Director for Cardiac Rehab at [facility].

## 2020-03-03 ENCOUNTER — Other Ambulatory Visit: Payer: Self-pay

## 2020-03-03 ENCOUNTER — Encounter (HOSPITAL_COMMUNITY)
Admission: RE | Admit: 2020-03-03 | Discharge: 2020-03-03 | Disposition: A | Discharge status: Home / Self Care | Payer: Medicare Other | Source: Ambulatory Visit | Attending: Pulmonary Disease | Admitting: Pulmonary Disease

## 2020-03-03 VITALS — Wt 150.4 lb

## 2020-03-03 DIAGNOSIS — J449 Chronic obstructive pulmonary disease, unspecified: Secondary | ICD-10-CM | POA: Diagnosis not present

## 2020-03-03 NOTE — Progress Notes (Signed)
Daily Session Note  Patient Details  Name: Katie Waller MRN: [id number] Date of Birth: 28-Feb-1946 Referring Provider:     Pulmonary Rehab Walk Test from 02/05/2020 in [place]  Referring Provider Dr. Marshell Garfinkel MD      Encounter Date: 03/03/2020  Check In:  Session Check In - 03/03/20 0952      Check-In   Supervising physician immediately available to respond to emergencies [facility] immediately available    Physician(s) Dr. Barth Kirks    [facility]    Staff Present [name], RN, Bjorn Loser, MS, CEP, Exercise Physiologist;Lisa Jani Gravel, MS, ACSM-CEP, Exercise Physiologist    Virtual Visit No    Medication changes reported     No    Fall or balance concerns reported    No    Tobacco Cessation No Change    Warm-up and Cool-down Performed on first and last piece of equipment    Resistance Training Performed Yes    VAD Patient? No    PAD/SET Patient? No      Pain Assessment   Currently in Pain? No/denies    Multiple Pain Sites No           Capillary Blood Glucose: No results found for this or any previous visit (from the past 24 hour(s)).   Exercise Prescription Changes - 03/03/20 1200      Response to Exercise   Blood Pressure (Admit) 130/64    Blood Pressure (Exercise) 156/70    Blood Pressure (Exit) 122/78    Heart Rate (Admit) 108 bpm    Heart Rate (Exercise) 114 bpm    Heart Rate (Exit) 116 bpm    Oxygen Saturation (Admit) 99 %    Oxygen Saturation (Exercise) 100 %    Oxygen Saturation (Exit) 100 %    Rating of Perceived Exertion (Exercise) 11    Perceived Dyspnea (Exercise) 1    Duration Continue with 30 min of aerobic exercise without signs/symptoms of physical distress.    Intensity THRR unchanged      Progression   Progression Continue to progress workloads to maintain intensity without signs/symptoms of physical distress.      Resistance Training    Training Prescription Yes    Weight orange band    Reps 10-15    Time 10 Minutes      Oxygen   Oxygen Continuous    Liters 3      NuStep   Level 2    SPM 80    Minutes 30    METs 2.2           Social History   Tobacco Use  Smoking Status Former Smoker  . Packs/day: 1.00  . Years: 55.00  . Pack years: 55.00  . Types: Cigarettes  . Start date: 07/23/2017  . Quit date: 07/23/2017  . Years since quitting: 2.6  Smokeless Tobacco Never Used    Goals Met:  Proper associated with RPD/PD & O2 Sat Exercise tolerated well Strength training completed today  Goals Unmet:  Not Applicable  Comments: Service time is from 0945 to 4   Dr. Fransico Him is Medical Director for Cardiac Rehab at [facility].

## 2020-03-05 ENCOUNTER — Encounter (HOSPITAL_COMMUNITY): Payer: Medicare Other

## 2020-03-05 ENCOUNTER — Telehealth (HOSPITAL_COMMUNITY): Payer: Self-pay | Admitting: Family Medicine

## 2020-03-10 ENCOUNTER — Encounter (HOSPITAL_COMMUNITY): Payer: Medicare Other

## 2020-03-12 ENCOUNTER — Other Ambulatory Visit: Payer: Self-pay

## 2020-03-12 ENCOUNTER — Encounter (HOSPITAL_COMMUNITY)
Admission: RE | Admit: 2020-03-12 | Discharge: 2020-03-12 | Disposition: A | Discharge status: Home / Self Care | Payer: Medicare Other | Source: Ambulatory Visit | Attending: Pulmonary Disease | Admitting: Pulmonary Disease

## 2020-03-12 DIAGNOSIS — J449 Chronic obstructive pulmonary disease, unspecified: Secondary | ICD-10-CM | POA: Diagnosis present

## 2020-03-12 NOTE — Progress Notes (Signed)
Daily Session Note  Patient Details  Name: Katie Waller MRN: [id number] Date of Birth: May 08, 1946 Referring Provider:     Pulmonary Rehab Walk Test from 02/05/2020 in [place]  Referring Provider Dr. Marshell Garfinkel MD      Encounter Date: 03/12/2020  Check In:  Session Check In - 03/12/20 [place]      Check-In   Supervising physician immediately available to respond to emergencies [facility] immediately available    Physician(s) Dr. Sloan Leiter    Location [facility]    Staff Present [name], RN, Bjorn Loser, MS, CEP, Exercise Physiologist;Lisa [name], RN    Virtual Visit No    Medication changes reported     No    Fall or balance concerns reported    No    Tobacco Cessation No Change    Warm-up and Cool-down Performed on first and last piece of equipment    Resistance Training Performed Yes    VAD Patient? No    PAD/SET Patient? No      Pain Assessment   Currently in Pain? No/denies    Multiple Pain Sites No           Capillary Blood Glucose: No results found for this or any previous visit (from the past 24 hour(s)).    Social History   Tobacco Use  Smoking Status Former Smoker  . Packs/day: 1.00  . Years: 55.00  . Pack years: 55.00  . Types: Cigarettes  . Start date: 07/23/2017  . Quit date: 07/23/2017  . Years since quitting: 2.6  Smokeless Tobacco Never Used    Goals Met:  Proper associated with RPD/PD & O2 Sat Exercise tolerated well Strength training completed today  Goals Unmet:  Not Applicable  Comments: Service time is from 0945 to 1100    Dr. Fransico Him is Medical Director for Cardiac Rehab at [facility].

## 2020-03-12 NOTE — Progress Notes (Signed)
I have reviewed a Home Exercise Prescription with [name] . Katie Waller is  currently exercising at home.  The patient was advised to walk 2 days a week for 30 minutes.  Amour and I discussed how to progress their exercise prescription.  The patient stated that their goals were to get around better and to breathe better.  The patient stated that they understand the exercise prescription.  We reviewed exercise guidelines, target heart rate during exercise, RPE Scale, weather conditions, NTG use, endpoints for exercise, warmup and cool down.  Patient is encouraged to come to me with any questions. I will continue to follow up with the patient to assist them with progression and safety.

## 2020-03-13 ENCOUNTER — Encounter: Payer: Self-pay | Admitting: Cardiology

## 2020-03-13 ENCOUNTER — Ambulatory Visit (INDEPENDENT_AMBULATORY_CARE_PROVIDER_SITE_OTHER): Payer: Medicare Other | Admitting: Cardiology

## 2020-03-13 VITALS — BP 138/72 | HR 110 | Ht 61.5 in | Wt 152.0 lb

## 2020-03-13 DIAGNOSIS — I10 Essential (primary) hypertension: Secondary | ICD-10-CM | POA: Diagnosis not present

## 2020-03-13 DIAGNOSIS — R000 Tachycardia, unspecified: Secondary | ICD-10-CM

## 2020-03-13 DIAGNOSIS — J449 Chronic obstructive pulmonary disease, unspecified: Secondary | ICD-10-CM

## 2020-03-13 DIAGNOSIS — I5032 Chronic diastolic (congestive) heart failure: Secondary | ICD-10-CM | POA: Diagnosis not present

## 2020-03-13 MED ORDER — RANOLAZINE ER 500 MG PO TB12: 500.0000 mg | ORAL_TABLET | Freq: Two times a day (BID) | ORAL | 1 refills | Status: DC

## 2020-03-13 NOTE — Progress Notes (Signed)
Cardiology Office Note:    Date:  03/13/2020   ID:  Katie Waller, DOB Jan 15, 1946, MRN [id number]  PCP:  Elias Else, MD  Cardiologist:  Gypsy Balsam, MD    Referring MD: Elias Else, MD   No chief complaint on file. My heart rate is fast  History of Present Illness:    Katie Waller is a 74 y.o. 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  Comes today to my office for follow-up.  Chief complaint is the fact that her heart rate speeds up.  She said she cannot do any effort and she will feel her heart going fast.  Also recently she started pulmonary rehab and she noticed that sometimes they have to slow down exercises because of heart rate being too fast.  She also described to have some uneasy sensation in the chest when she has fast heartbeats.  Is difficult for her to tell if this is because of pounding in the chest because of heart rate spitting up in different reasons.  Overall she is living fairly sedentary lifestyle.  Past Medical History:  Diagnosis Date  . Asthma   . Diabetes (HCC)   . Heart disease   . Hypertension     Past Surgical History:  Procedure Laterality Date  . KNEE SURGERY Left     Current Medications: Current Meds  Medication Sig  . albuterol (PROVENTIL HFA;VENTOLIN HFA) 108 (90 BASE) MCG/ACT inhaler Inhale 2 puffs into the lungs every 6 (six) hours as needed for wheezing or shortness of breath.  Marland Kitchen amLODipine (NORVASC) 5 MG tablet Take 5 mg by mouth every morning.  Marland Kitchen aspirin 81 MG chewable tablet Chew 1 tablet (81 mg total) by mouth daily. (Patient taking differently: Chew 81 mg by mouth at bedtime. )  . benzonatate (TESSALON) 100 MG capsule Take 100 mg by mouth 3 (three) times daily as needed for cough.   . budesonide-formoterol (SYMBICORT) 160-4.5 MCG/ACT inhaler Inhale 2 puffs into the lungs 2 (two) times daily.  . Calcium 600-200 MG-UNIT  tablet Take 1 tablet by mouth daily.  . furosemide (LASIX) 40 MG tablet Take 40 mg by mouth daily as needed for fluid or edema.   . gabapentin (NEURONTIN) 300 MG capsule Take 300 mg by mouth at bedtime.  Marland Kitchen ipratropium-albuterol (DUONEB) 0.5-2.5 (3) MG/3ML SOLN Take 3 mLs by nebulization 3 (three) times daily. DX: J44.1 (Patient taking differently: Take 3 mLs by nebulization as needed. DX: J44.1)  . LORazepam (ATIVAN) 1 MG tablet Take 1 tablet (1 mg total) by mouth every 6 (six) hours as needed for anxiety.  Marland Kitchen losartan (COZAAR) 50 MG tablet Take 50 mg by mouth daily.  . Multiple Vitamin (MULTIVITAMIN) tablet Take 1 tablet by mouth daily.  . ondansetron (ZOFRAN ODT) 4 MG disintegrating tablet Take 1 tablet (4 mg total) by mouth every 8 (eight) hours as needed for nausea or vomiting.  . tiotropium (SPIRIVA) 18 MCG inhalation capsule Place 18 mcg into inhaler and inhale daily.  . traZODone (DESYREL) 100 MG tablet Take 1 tablet (100 mg total) by mouth at bedtime as needed for sleep. (Patient taking differently: Take 100 mg by mouth at bedtime. )     Allergies:   Ace inhibitors and Codeine   Social History   Socioeconomic History  . Marital status: Divorced    Spouse name: Not on file  . Number of children: Not on file  . Years of  education: Not on file  . Highest education level: Not on file  Occupational History  . Not on file  Tobacco Use  . Smoking status: Former Smoker    Packs/day: 1.00    Years: 55.00    Pack years: 55.00    Types: Cigarettes    Start date: 07/23/2017    Quit date: 07/23/2017    Years since quitting: 2.6  . Smokeless tobacco: Never Used  Vaping Use  . Vaping Use: Former  Substance and Sexual Activity  . Alcohol use: No  . Drug use: No  . Sexual activity: Never  Other Topics Concern  . Not on file  Social History Narrative  . Not on file   Social Determinants of Health   Financial Resource Strain:   . Difficulty of Paying Living Expenses:   Food  Insecurity:   . Worried About Programme researcher, broadcasting/film/video in the Last Year:   . Barista in the Last Year:   Transportation Needs:   . Freight forwarder (Medical):   Marland Kitchen Lack of Transportation (Non-Medical):   Physical Activity:   . Days of Exercise per Week:   . Minutes of Exercise per Session:   Stress:   . Feeling of Stress :   Social Connections:   . Frequency of Communication with Friends and Family:   . Frequency of Social Gatherings with Friends and Family:   . Attends Religious Services:   . Active Member of Clubs or Organizations:   . Attends Banker Meetings:   Marland Kitchen Marital Status:      Family History: The patient's family history includes Hyperlipidemia in her father and mother; Hypertension in her father and mother. ROS:   Please see the history of present illness.    All 14 point review of systems negative except as described per history of present illness  EKGs/Labs/Other Studies Reviewed:      Recent Labs: No results found for requested labs within last 8760 hours.  Recent Lipid Panel    Component Value Date/Time   CHOL 169 09/18/2013 0614   TRIG 134 09/18/2013 0614   HDL 76 09/18/2013 0614   CHOLHDL 2.2 09/18/2013 0614   VLDL 27 09/18/2013 0614   LDLCALC 66 09/18/2013 0614    Physical Exam:    VS:  BP 138/72 (BP Location: Right Arm, Patient Position: Sitting, Cuff Size: Normal)   Pulse (!) 110   Ht 5' 1.5" (1.562 m)   Wt 152 lb (68.9 kg)   SpO2 (!) 89%   BMI 28.26 kg/m     Wt Readings from Last 3 Encounters:  03/13/20 152 lb (68.9 kg)  03/03/20 150 lb 5.7 oz (68.2 kg)  02/18/20 152 lb 12.5 oz (69.3 kg)     GEN:  Well nourished, well developed in no acute distress HEENT: Normal NECK: No JVD; No carotid bruits LYMPHATICS: No lymphadenopathy CARDIAC: RRR, no murmurs, no rubs, no gallops RESPIRATORY:  Clear to auscultation without rales, wheezing or rhonchi  ABDOMEN: Soft, non-tender, non-distended MUSCULOSKELETAL:  No edema;  No deformity  SKIN: Warm and dry LOWER EXTREMITIES: no swelling NEUROLOGIC:  Alert and oriented x 3 PSYCHIATRIC:  Normal affect   ASSESSMENT:    1. Sinus tachycardia   2. Chronic diastolic CHF (congestive heart failure) (HCC)   3. Essential hypertension   4. COPD, group D, by GOLD 2017 classification (HCC)    PLAN:    In order of problems listed above:  1. Sinus tachycardia: I  suspect this is multifactorial mostly related to significant lung problem obviously cardiac condition need to be rule out, therefore, I will ask you to have an echocardiogram to assess left ventricle ejection fraction. 2. Chronic diastolic congestive heart failure she looks compensated on my physical examination echocardiogram will be done to check it. 3. Essential hypertension blood pressure well controlled continue present management. 4. COPD: Advanced.  On oxygen all the time. 5. Chest tightness she had difficult time to describe it I am not sure exactly if this is chest tightness or simply pounding in the chest.  I will try to give her ranolazine 500 mg twice daily see how she respond to the therapy.   Medication Adjustments/Labs and Tests Ordered: Current medicines are reviewed at length with the patient today.  Concerns regarding medicines are outlined above.  No orders of the defined types were placed in this encounter.  Medication changes: No orders of the defined types were placed in this encounter.   Signed, Georgeanna Lea, MD, [facility] 03/13/2020 2:45 PM    [facility]

## 2020-03-13 NOTE — Patient Instructions (Signed)
Medication Instructions:  Your physician has recommended you make the following change in your medication:  START: Ranexa 500 mg twice daily  *If you need a refill on your cardiac medications before your next appointment, please call your pharmacy*   Lab Work: None.  If you have labs (blood work) drawn today and your tests are completely normal, you will receive your results only by: Marland Kitchen MyChart Message (if you have MyChart) OR . A paper copy in the mail If you have any lab test that is abnormal or we need to change your treatment, we will call you to review the results.   Testing/Procedures: Your physician has requested that you have an echocardiogram. Echocardiography is a painless test that uses sound waves to create images of your heart. It provides your doctor with information about the size and shape of your heart and how well your heart's chambers and valves are working. This procedure takes approximately one hour. There are no restrictions for this procedure.     Follow-Up: At [facility], you and your health needs are our priority.  As part of our continuing mission to provide you with exceptional heart care, we have created designated Provider Care Teams.  These Care Teams include your primary Cardiologist (physician) and Advanced Practice Providers (APPs -  Physician Assistants and Nurse Practitioners) who all work together to provide you with the care you need, when you need it.  We recommend signing up for the patient portal called "MyChart".  Sign up information is provided on this After Visit Summary.  MyChart is used to connect with patients for Virtual Visits (Telemedicine).  Patients are able to view lab/test results, encounter notes, upcoming appointments, etc.  Non-urgent messages can be sent to your provider as well.   To learn more about what you can do with MyChart, go to [email or web address].    Your next appointment:   1 month(s)  The format for your next  appointment:   In Person  Provider:   Gypsy Balsam, MD   Other Instructions   Echocardiogram An echocardiogram is a procedure that uses painless sound waves (ultrasound) to produce an image of the heart. Images from an echocardiogram can provide important information about:  Signs of coronary artery disease (CAD).  Aneurysm detection. An aneurysm is a weak or damaged part of an artery wall that bulges out from the normal force of blood pumping through the body.  Heart size and shape. Changes in the size or shape of the heart can be associated with certain conditions, including heart failure, aneurysm, and CAD.  Heart muscle function.  Heart valve function.  Signs of a past heart attack.  Fluid buildup around the heart.  Thickening of the heart muscle.  A tumor or infectious growth around the heart valves. Tell a health care provider about:  Any allergies you have.  All medicines you are taking, including vitamins, herbs, eye drops, creams, and over-the-counter medicines.  Any blood disorders you have.  Any surgeries you have had.  Any medical conditions you have.  Whether you are pregnant or may be pregnant. What are the risks? Generally, this is a safe procedure. However, problems may occur, including:  Allergic reaction to dye (contrast) that may be used during the procedure. What happens before the procedure? No specific preparation is needed. You may eat and drink normally. What happens during the procedure?   An IV tube may be inserted into one of your veins.  You may receive contrast through this  tube. A contrast is an injection that improves the quality of the pictures from your heart.  A gel will be applied to your chest.  A wand-like tool (transducer) will be moved over your chest. The gel will help to transmit the sound waves from the transducer.  The sound waves will harmlessly bounce off of your heart to allow the heart images to be captured  in real-time motion. The images will be recorded on a computer. The procedure may vary among health care providers and hospitals. What happens after the procedure?  You may return to your normal, everyday life, including diet, activities, and medicines, unless your health care provider tells you not to do that. Summary  An echocardiogram is a procedure that uses painless sound waves (ultrasound) to produce an image of the heart.  Images from an echocardiogram can provide important information about the size and shape of your heart, heart muscle function, heart valve function, and fluid buildup around your heart.  You do not need to do anything to prepare before this procedure. You may eat and drink normally.  After the echocardiogram is completed, you may return to your normal, everyday life, unless your health care provider tells you not to do that. This information is not intended to replace advice given to you by your health care provider. Make sure you discuss any questions you have with your health care provider. Document Revised: 12/13/2018 Document Reviewed: 09/24/2016 Elsevier Patient Education  2020 Elsevier Inc.  Ranolazine tablets, extended release What is this medicine? RANOLAZINE (ra NOE la zeen) is a heart medicine. It is used to treat chronic chest pain (angina). This medicine must be taken regularly. It will not relieve an acute episode of chest pain. This medicine may be used for other purposes; ask your health care provider or pharmacist if you have questions. COMMON BRAND NAME(S): Ranexa What should I tell my health care provider before I take this medicine? They need to know if you have any of these conditions:  heart disease  irregular heartbeat  kidney disease  liver disease  low levels of potassium or magnesium in the blood  an unusual or allergic reaction to ranolazine, other medicines, foods, dyes, or preservatives  pregnant or trying to get  pregnant  breast-feeding How should I use this medicine? Take this medicine by mouth with a glass of water. Follow the directions on the prescription label. Do not cut, crush, or chew this medicine. Take with or without food. Do not take this medication with grapefruit juice. Take your doses at regular intervals. Do not take your medicine more often then directed. Talk to your pediatrician regarding the use of this medicine in children. Special care may be needed. Overdosage: If you think you have taken too much of this medicine contact a poison control center or emergency room at once. NOTE: This medicine is only for you. Do not share this medicine with others. What if I miss a dose? If you miss a dose, take it as soon as you can. If it is almost time for your next dose, take only that dose. Do not take double or extra doses. What may interact with this medicine? Do not take this medicine with any of the following medications:  antivirals for HIV or AIDS  cerivastatin  certain antibiotics like chloramphenicol, clarithromycin, dalfopristin; quinupristin, isoniazid, rifabutin, rifampin, rifapentine  certain medicines used for cancer like imatinib, nilotinib  certain medicines for fungal infections like fluconazole, itraconazole, ketoconazole, posaconazole, voriconazole  certain  medicines for irregular heart beat like dronedarone  certain medicines for seizures like carbamazepine, fosphenytoin, oxcarbazepine, phenobarbital, phenytoin  cisapride  conivaptan  cyclosporine  grapefruit or grapefruit juice  lumacaftor; ivacaftor  nefazodone  pimozide  quinacrine  St John's wort  thioridazine This medicine may also interact with the following medications:  alfuzosin  certain medicines for depression, anxiety, or psychotic disturbances like bupropion, citalopram, fluoxetine, fluphenazine, paroxetine, perphenazine, risperidone, sertraline, trifluoperazine  certain medicines  for cholesterol like atorvastatin, lovastatin, simvastatin  certain medicines for stomach problems like octreotide, palonosetron, prochlorperazine  eplerenone  ergot alkaloids like dihydroergotamine, ergonovine, ergotamine, methylergonovine  metformin  nicardipine  other medicines that prolong the QT interval (cause an abnormal heart rhythm) like dofetilide, ziprasidone  sirolimus  tacrolimus This list may not describe all possible interactions. Give your health care provider a list of all the medicines, herbs, non-prescription drugs, or dietary supplements you use. Also tell them if you smoke, drink alcohol, or use illegal drugs. Some items may interact with your medicine. What should I watch for while using this medicine? Visit your doctor for regular check ups. Tell your doctor or healthcare professional if your symptoms do not start to get better or if they get worse. This medicine will not relieve an acute attack of angina or chest pain. This medicine can change your heart rhythm. Your health care provider may check your heart rhythm by ordering an electrocardiogram (ECG) while you are taking this medicine. You may get drowsy or dizzy. Do not drive, use machinery, or do anything that needs mental alertness until you know how this medicine affects you. Do not stand or sit up quickly, especially if you are an older patient. This reduces the risk of dizzy or fainting spells. Alcohol may interfere with the effect of this medicine. Avoid alcoholic drinks. If you are scheduled for any medical or dental procedure, tell your healthcare provider that you are taking this medicine. This medicine can interact with other medicines used during surgery. What side effects may I notice from receiving this medicine? Side effects that you should report to your doctor or health care professional as soon as possible:  allergic reactions like skin rash, itching or hives, swelling of the face, lips, or  tongue  breathing problems  changes in vision  fast, irregular or pounding heartbeat  feeling faint or lightheaded, falls  low or high blood pressure  numbness or tingling feelings  ringing in the ears  tremor or shakiness  slow heartbeat (fewer than 50 beats per minute)  swelling of the legs or feet Side effects that usually do not require medical attention (report to your doctor or health care professional if they continue or are bothersome):  constipation  drowsy  dry mouth  headache  nausea or vomiting  stomach upset This list may not describe all possible side effects. Call your doctor for medical advice about side effects. You may report side effects to FDA at 1-800-FDA-1088. Where should I keep my medicine? Keep out of the reach of children. Store at room temperature between 15 and 30 degrees C (59 and 86 degrees F). Throw away any unused medicine after the expiration date. NOTE: This sheet is a summary. It may not cover all possible information. If you have questions about this medicine, talk to your doctor, pharmacist, or health care provider.  2020 Elsevier/Gold Standard (2018-08-14 09:18:49)

## 2020-03-13 NOTE — Addendum Note (Signed)
Addended by: Lita Mains on: 03/13/2020 02:52 PM   Modules accepted: Orders

## 2020-03-17 ENCOUNTER — Encounter (HOSPITAL_COMMUNITY)
Admission: RE | Admit: 2020-03-17 | Discharge: 2020-03-17 | Disposition: A | Discharge status: Home / Self Care | Payer: Medicare Other | Source: Ambulatory Visit | Attending: Pulmonary Disease | Admitting: Pulmonary Disease

## 2020-03-17 ENCOUNTER — Other Ambulatory Visit: Payer: Self-pay

## 2020-03-17 VITALS — Wt 152.1 lb

## 2020-03-17 DIAGNOSIS — J449 Chronic obstructive pulmonary disease, unspecified: Secondary | ICD-10-CM

## 2020-03-17 NOTE — Addendum Note (Signed)
Encounter addended by: Drema Pry, RN on: 03/17/2020 11:10 AM  Actions taken: Clinical Note Signed, Delete clinical note

## 2020-03-17 NOTE — Progress Notes (Deleted)
Katie Waller has been absent from pulmonary rehab for 3 weeks, her mailbox is full and unable to leave a message, she is being discharged due to poor attendance and having a COPD exacerbation.

## 2020-03-17 NOTE — Progress Notes (Signed)
Daily Session Note  Patient Details  Name: Katie Waller MRN: [id number] Date of Birth: 1946-02-06 Referring Provider:     Pulmonary Rehab Walk Test from 02/05/2020 in [place]  Referring Provider Dr. Marshell Garfinkel MD      Encounter Date: 03/17/2020  Check In:  Session Check In - 03/17/20 1000      Check-In   Supervising physician immediately available to respond to emergencies [facility] immediately available    Physician(s) Dr. Sloan Leiter    Location [facility]    Staff Present [name], RN, BSN;Carlette Wilber Oliphant, RN, Bjorn Loser, MS, CEP, Exercise Physiologist    Virtual Visit No    Medication changes reported     No    Fall or balance concerns reported    No    Tobacco Cessation No Change    Warm-up and Cool-down Performed as group-led instruction    Resistance Training Performed Yes    VAD Patient? No    PAD/SET Patient? No      Pain Assessment   Currently in Pain? No/denies    Multiple Pain Sites No           Capillary Blood Glucose: No results found for this or any previous visit (from the past 24 hour(s)).   Exercise Prescription Changes - 03/17/20 1100      Response to Exercise   Blood Pressure (Admit) 118/66    Blood Pressure (Exercise) 126/62    Blood Pressure (Exit) 124/76    Heart Rate (Admit) 103 bpm    Heart Rate (Exercise) 111 bpm    Heart Rate (Exit) 113 bpm    Oxygen Saturation (Admit) 100 %    Oxygen Saturation (Exercise) 97 %    Oxygen Saturation (Exit) 99 %    Rating of Perceived Exertion (Exercise) 14    Perceived Dyspnea (Exercise) 3    Duration Continue with 30 min of aerobic exercise without signs/symptoms of physical distress.    Intensity THRR New      Progression   Progression Continue to progress workloads to maintain intensity without signs/symptoms of physical distress.      Resistance Training   Training Prescription Yes    Weight orange bands    Reps  10-15    Time 10 Minutes      Oxygen   Oxygen Continuous    Liters 3      NuStep   Level 2    SPM 80    Minutes 30    METs 2.3           Social History   Tobacco Use  Smoking Status Former Smoker  . Packs/day: 1.00  . Years: 55.00  . Pack years: 55.00  . Types: Cigarettes  . Start date: 07/23/2017  . Quit date: 07/23/2017  . Years since quitting: 2.6  Smokeless Tobacco Never Used    Goals Met:  Proper associated with RPD/PD & O2 Sat Exercise tolerated well Strength training completed today  Goals Unmet:  Not Applicable  Comments: Service time is from 0950  to 1046.    Dr. Fransico Him is Medical Director for Cardiac Rehab at [facility].

## 2020-03-19 ENCOUNTER — Telehealth (HOSPITAL_COMMUNITY): Payer: Self-pay | Admitting: Family Medicine

## 2020-03-19 ENCOUNTER — Encounter (HOSPITAL_COMMUNITY): Payer: Medicare Other

## 2020-03-19 NOTE — Progress Notes (Signed)
Pulmonary Individual Treatment Plan  Patient Details  Name: RANIYA GOLEMBESKI MRN: [id number] Date of Birth: Jan 28, 1946 Referring Provider:     Pulmonary Rehab Walk Test from 02/05/2020 in [facility] CARDIAC REHAB  Referring Provider Dr. Chilton Greathouse MD      Initial Encounter Date:    Pulmonary Rehab Walk Test from 02/05/2020 in [facility] CARDIAC REHAB  Date 02/05/20      Visit Diagnosis: Chronic obstructive pulmonary disease, unspecified COPD type (HCC)  Patient's Home Medications on Admission:   Current Outpatient Medications:  .  albuterol (PROVENTIL HFA;VENTOLIN HFA) 108 (90 BASE) MCG/ACT inhaler, Inhale 2 puffs into the lungs every 6 (six) hours as needed for wheezing or shortness of breath., Disp: 1 Inhaler, Rfl: 0 .  amLODipine (NORVASC) 5 MG tablet, Take 5 mg by mouth every morning., Disp: , Rfl:  .  aspirin 81 MG chewable tablet, Chew 1 tablet (81 mg total) by mouth daily. (Patient taking differently: Chew 81 mg by mouth at bedtime. ), Disp: , Rfl:  .  benzonatate (TESSALON) 100 MG capsule, Take 100 mg by mouth 3 (three) times daily as needed for cough. , Disp: , Rfl:  .  budesonide-formoterol (SYMBICORT) 160-4.5 MCG/ACT inhaler, Inhale 2 puffs into the lungs 2 (two) times daily., Disp: 1 Inhaler, Rfl: 4 .  Calcium 600-200 MG-UNIT tablet, Take 1 tablet by mouth daily., Disp: , Rfl:  .  furosemide (LASIX) 40 MG tablet, Take 40 mg by mouth daily as needed for fluid or edema. , Disp: , Rfl:  .  gabapentin (NEURONTIN) 300 MG capsule, Take 300 mg by mouth at bedtime., Disp: , Rfl:  .  ipratropium-albuterol (DUONEB) 0.5-2.5 (3) MG/3ML SOLN, Take 3 mLs by nebulization 3 (three) times daily. DX: J44.1 (Patient taking differently: Take 3 mLs by nebulization as needed. DX: J44.1), Disp: 360 mL, Rfl: 3 .  LORazepam (ATIVAN) 1 MG tablet, Take 1 tablet (1 mg total) by mouth every 6 (six) hours as needed for anxiety., Disp: 30 tablet, Rfl: 0 .  losartan  (COZAAR) 50 MG tablet, Take 50 mg by mouth daily., Disp: , Rfl:  .  Multiple Vitamin (MULTIVITAMIN) tablet, Take 1 tablet by mouth daily., Disp: , Rfl:  .  ondansetron (ZOFRAN ODT) 4 MG disintegrating tablet, Take 1 tablet (4 mg total) by mouth every 8 (eight) hours as needed for nausea or vomiting., Disp: 5 tablet, Rfl: 0 .  ranolazine (RANEXA) 500 MG 12 hr tablet, Take 1 tablet (500 mg total) by mouth 2 (two) times daily., Disp: 60 tablet, Rfl: 1 .  tiotropium (SPIRIVA) 18 MCG inhalation capsule, Place 18 mcg into inhaler and inhale daily., Disp: , Rfl:  .  traZODone (DESYREL) 100 MG tablet, Take 1 tablet (100 mg total) by mouth at bedtime as needed for sleep. (Patient taking differently: Take 100 mg by mouth at bedtime. ), Disp: 10 tablet, Rfl: 0  Past Medical History: Past Medical History:  Diagnosis Date  . Asthma   . Diabetes (HCC)   . Heart disease   . Hypertension     Tobacco Use: Social History   Tobacco Use  Smoking Status Former Smoker  . Packs/day: 1.00  . Years: 55.00  . Pack years: 55.00  . Types: Cigarettes  . Start date: 07/23/2017  . Quit date: 07/23/2017  . Years since quitting: 2.6  Smokeless Tobacco Never Used    Labs: Recent Review [facility] for [facility] Cardiac and Pulmonary Rehab Latest  Ref Rng & Units 09/17/2013 09/18/2013 07/26/2017 07/31/2017   Cholestrol 0 - 200 mg/dL - 169 - -   LDLCALC 0 - 99 mg/dL - 66 - -   HDL >39 mg/dL - 76 - -   Trlycerides <150 mg/dL - 134 - -   Hemoglobin A1c <5.7 % 6.1(H) - - -   PHART 7.35 - 7.45 - - 7.310(L) 7.334(L)   PCO2ART 32 - 48 mmHg - - 92.7(HH) 89.0(HH)   HCO3 20.0 - 28.0 mmol/L - - 45.3(H) 46.0(H)   O2SAT % - - 81.4 90.4      Capillary Blood Glucose: No results found for: GLUCAP   Pulmonary Assessment Scores:  Pulmonary Assessment Scores    Row Name 02/05/20 1105 02/05/20 1241       ADL UCSD   ADL Phase -- Entry    SOB Score total -- 62      CAT Score   CAT Score -- 27      mMRC Score    mMRC Score 4 4          UCSD: Self-administered rating of dyspnea associated with activities of daily living (ADLs) 6-point scale (0 = "not at all" to 5 = "maximal or unable to do because of breathlessness")  Scoring Scores range from 0 to 120.  Minimally important difference is 5 units  CAT: CAT can identify the health impairment of COPD patients and is better correlated with disease progression.  CAT has a scoring range of zero to 40. The CAT score is classified into four groups of low (less than 10), medium (10 - 20), high (21-30) and very high (31-40) based on the impact level of disease on health status. A CAT score over 10 suggests significant symptoms.  A worsening CAT score could be explained by an exacerbation, poor medication adherence, poor inhaler technique, or progression of COPD or comorbid conditions.  CAT MCID is 2 points  mMRC: mMRC (Modified Medical Research Council) Dyspnea Scale is used to assess the degree of baseline functional disability in patients of respiratory disease due to dyspnea. No minimal important difference is established. A decrease in score of 1 point or greater is considered a positive change.   Pulmonary Function Assessment:  Pulmonary Function Assessment - 02/05/20 1241      Post Bronchodilator Spirometry Results   FEV1% 36 %      Breath   Bilateral Breath Sounds Clear;Decreased    Shortness of Breath Yes;Limiting activity           Exercise Target Goals: Exercise Program Goal: Individual exercise prescription set using results from initial 6 min walk test and THRR while considering  patient's activity barriers and safety.   Exercise Prescription Goal: Initial exercise prescription builds to 30-45 minutes a day of aerobic activity, 2-3 days per week.  Home exercise guidelines will be given to patient during program as part of exercise prescription that the participant will acknowledge.  Activity Barriers & Risk Stratification:   Activity Barriers & Cardiac Risk Stratification - 02/05/20 0954      Activity Barriers & Cardiac Risk Stratification   Activity Barriers Deconditioning;Muscular Weakness;Shortness of Breath;Arthritis;History of Falls;Assistive Device           6 Minute Walk:  6 Minute Walk    Row Name 02/05/20 1058         6 Minute Walk   Distance 904 feet     Walk Time 6 minutes  Total Walk Time: 5 minutes, 18 seconds     #  of Rest Breaks 1     MPH 1.7     METS 2.3     RPE 13     Perceived Dyspnea  1     VO2 Peak 8.1     Symptoms Yes (comment)     Comments Mild SOB, Took 1 42 second rest break halfway through walk test     Resting HR 99 bpm     Resting BP 130/80     Resting Oxygen Saturation  100 %     Exercise Oxygen Saturation  during 6 min walk 95 %     Max Ex. HR 124 bpm     Max Ex. BP 136/84     2 Minute Post BP 128/76            Oxygen Initial Assessment:  Oxygen Initial Assessment - 02/05/20 0959      Home Oxygen   Home Oxygen Device E-Tanks;Home Concentrator;Portable Concentrator    Sleep Oxygen Prescription Continuous    Liters per minute 3    Home Exercise Oxygen Prescription Pulsed    Liters per minute 3    Home at Rest Exercise Oxygen Prescription Continuous    Liters per minute 3    Compliance with Home Oxygen Use Yes           Oxygen Re-Evaluation:  Oxygen Re-Evaluation    Row Name 02/18/20 1459 03/17/20 0648           Program Oxygen Prescription   Program Oxygen Prescription Continuous Continuous      Liters per minute 3 3        Home Oxygen   Home Oxygen Device E-Tanks;Home Concentrator;Portable Concentrator E-Tanks;Home Concentrator;Portable Concentrator      Sleep Oxygen Prescription Continuous Continuous      Liters per minute 3 3      Home Exercise Oxygen Prescription Pulsed Pulsed      Liters per minute 3 3      Home at Rest Exercise Oxygen Prescription Continuous Continuous      Liters per minute 3 3      Compliance with Home Oxygen Use  Yes Yes        Goals/Expected Outcomes   Short Term Goals To learn and exhibit compliance with exercise, home and travel O2 prescription;To learn and understand importance of monitoring SPO2 with pulse oximeter and demonstrate accurate use of the pulse oximeter.;To learn and understand importance of maintaining oxygen saturations>88%;To learn and demonstrate proper pursed lip breathing techniques or other breathing techniques.;To learn and demonstrate proper use of respiratory medications To learn and exhibit compliance with exercise, home and travel O2 prescription;To learn and understand importance of monitoring SPO2 with pulse oximeter and demonstrate accurate use of the pulse oximeter.;To learn and understand importance of maintaining oxygen saturations>88%;To learn and demonstrate proper pursed lip breathing techniques or other breathing techniques.;To learn and demonstrate proper use of respiratory medications      Long  Term Goals Exhibits compliance with exercise, home and travel O2 prescription;Verbalizes importance of monitoring SPO2 with pulse oximeter and return demonstration;Maintenance of O2 saturations>88%;Exhibits proper breathing techniques, such as pursed lip breathing or other method taught during program session;Compliance with respiratory medication;Demonstrates proper use of MDI's Exhibits compliance with exercise, home and travel O2 prescription;Verbalizes importance of monitoring SPO2 with pulse oximeter and return demonstration;Maintenance of O2 saturations>88%;Exhibits proper breathing techniques, such as pursed lip breathing or other method taught during program session;Compliance with respiratory medication;Demonstrates proper use of MDI's  Goals/Expected Outcomes compliance compliance             Oxygen Discharge (Final Oxygen Re-Evaluation):  Oxygen Re-Evaluation - 03/17/20 0648      Program Oxygen Prescription   Program Oxygen Prescription Continuous    Liters  per minute 3      Home Oxygen   Home Oxygen Device E-Tanks;Home Concentrator;Portable Concentrator    Sleep Oxygen Prescription Continuous    Liters per minute 3    Home Exercise Oxygen Prescription Pulsed    Liters per minute 3    Home at Rest Exercise Oxygen Prescription Continuous    Liters per minute 3    Compliance with Home Oxygen Use Yes      Goals/Expected Outcomes   Short Term Goals To learn and exhibit compliance with exercise, home and travel O2 prescription;To learn and understand importance of monitoring SPO2 with pulse oximeter and demonstrate accurate use of the pulse oximeter.;To learn and understand importance of maintaining oxygen saturations>88%;To learn and demonstrate proper pursed lip breathing techniques or other breathing techniques.;To learn and demonstrate proper use of respiratory medications    Long  Term Goals Exhibits compliance with exercise, home and travel O2 prescription;Verbalizes importance of monitoring SPO2 with pulse oximeter and return demonstration;Maintenance of O2 saturations>88%;Exhibits proper breathing techniques, such as pursed lip breathing or other method taught during program session;Compliance with respiratory medication;Demonstrates proper use of MDI's    Goals/Expected Outcomes compliance           Initial Exercise Prescription:  Initial Exercise Prescription - 02/05/20 1100      Date of Initial Exercise RX and Referring Provider   Date 02/05/20    Referring Provider Dr. Marshell Garfinkel MD    Expected Discharge Date 04/03/20      Oxygen   Oxygen Continuous    Liters 3      NuStep   Level 2    SPM 75    Minutes 30    METs 1.5      Prescription Details   Frequency (times per week) 2x    Duration Progress to 10 minutes continuous walking  at current work load and total walking time to 30-45 min      Intensity   THRR 40-80% of Max Heartrate 60-119    Ratings of Perceived Exertion 11-13    Perceived Dyspnea 0-4       Progression   Progression Continue progressive overload as per policy without signs/symptoms or physical distress.      Resistance Training   Training Prescription Yes    Weight Orange Band    Reps 10-15           Perform Capillary Blood Glucose checks as needed.  Exercise Prescription Changes:  Exercise Prescription Changes    Row Name 02/18/20 1100 03/03/20 1200 03/12/20 1100 03/17/20 1100       Response to Exercise   Blood Pressure (Admit) 118/70 130/64 -- 118/66    Blood Pressure (Exercise) 142/70 156/70 -- 126/62    Blood Pressure (Exit) 114/76 122/78 -- 124/76    Heart Rate (Admit) 102 bpm 108 bpm -- 103 bpm    Heart Rate (Exercise) 116 bpm 114 bpm -- 111 bpm    Heart Rate (Exit) 110 bpm 116 bpm -- 113 bpm    Oxygen Saturation (Admit) 99 % 99 % -- 100 %    Oxygen Saturation (Exercise) 99 % 100 % -- 97 %    Oxygen Saturation (Exit) 100 % 100 % -- 99 %  Rating of Perceived Exertion (Exercise) 13 11 -- 14    Perceived Dyspnea (Exercise) 2 1 -- 3    Duration Continue with 30 min of aerobic exercise without signs/symptoms of physical distress. Continue with 30 min of aerobic exercise without signs/symptoms of physical distress. -- Continue with 30 min of aerobic exercise without signs/symptoms of physical distress.    Intensity Other (comment) THRR unchanged -- THRR New      Progression   Progression Continue to progress workloads to maintain intensity without signs/symptoms of physical distress.  40-80% if HRR Continue to progress workloads to maintain intensity without signs/symptoms of physical distress. -- Continue to progress workloads to maintain intensity without signs/symptoms of physical distress.      Resistance Training   Training Prescription Yes Yes -- Yes    Weight orange bands orange band -- orange bands    Reps 10-15 10-15 -- 10-15    Time 10 Minutes 10 Minutes -- 10 Minutes      Oxygen   Oxygen Continuous Continuous -- Continuous    Liters 3 3 -- 3       NuStep   Level 2 2 -- 2    SPM 80 80 -- 80    Minutes 30 30 -- 30    METs 2 2.2 -- 2.3      Home Exercise Plan   Plans to continue exercise at -- -- Home (comment) --    Frequency -- -- Add 2 additional days to program exercise sessions. --    Initial Home Exercises Provided -- -- 03/12/20 --           Exercise Comments:  Exercise Comments    Row Name 02/11/20 1204 03/12/20 1143         Exercise Comments Pt completed first day of exercise. Tolerated well. No complaints. Home exercise reviewed             Exercise Goals and Review:  Exercise Goals    Row Name 02/05/20 1105 02/18/20 1500 03/17/20 0649         Exercise Goals   Increase Physical Activity Yes Yes Yes     Intervention Provide advice, education, support and counseling about physical activity/exercise needs.;Develop an individualized exercise prescription for aerobic and resistive training based on initial evaluation findings, risk stratification, comorbidities and participant's personal goals. Provide advice, education, support and counseling about physical activity/exercise needs.;Develop an individualized exercise prescription for aerobic and resistive training based on initial evaluation findings, risk stratification, comorbidities and participant's personal goals. Provide advice, education, support and counseling about physical activity/exercise needs.;Develop an individualized exercise prescription for aerobic and resistive training based on initial evaluation findings, risk stratification, comorbidities and participant's personal goals.     Expected Outcomes Short Term: Attend rehab on a regular basis to increase amount of physical activity.;Long Term: Add in home exercise to make exercise part of routine and to increase amount of physical activity.;Long Term: Exercising regularly at least 3-5 days a week. Short Term: Attend rehab on a regular basis to increase amount of physical activity.;Long Term: Add in home  exercise to make exercise part of routine and to increase amount of physical activity.;Long Term: Exercising regularly at least 3-5 days a week. Short Term: Attend rehab on a regular basis to increase amount of physical activity.;Long Term: Add in home exercise to make exercise part of routine and to increase amount of physical activity.;Long Term: Exercising regularly at least 3-5 days a week.     Increase Strength  and Stamina Yes Yes Yes     Intervention Provide advice, education, support and counseling about physical activity/exercise needs.;Develop an individualized exercise prescription for aerobic and resistive training based on initial evaluation findings, risk stratification, comorbidities and participant's personal goals. Provide advice, education, support and counseling about physical activity/exercise needs.;Develop an individualized exercise prescription for aerobic and resistive training based on initial evaluation findings, risk stratification, comorbidities and participant's personal goals. Provide advice, education, support and counseling about physical activity/exercise needs.;Develop an individualized exercise prescription for aerobic and resistive training based on initial evaluation findings, risk stratification, comorbidities and participant's personal goals.     Expected Outcomes Short Term: Perform resistance training exercises routinely during rehab and add in resistance training at home;Short Term: Increase workloads from initial exercise prescription for resistance, speed, and METs.;Long Term: Improve cardiorespiratory fitness, muscular endurance and strength as measured by increased METs and functional capacity ( ) Short Term: Perform resistance training exercises routinely during rehab and add in resistance training at home;Short Term: Increase workloads from initial exercise prescription for resistance, speed, and METs.;Long Term: Improve cardiorespiratory fitness, muscular  endurance and strength as measured by increased METs and functional capacity ( ) Short Term: Perform resistance training exercises routinely during rehab and add in resistance training at home;Short Term: Increase workloads from initial exercise prescription for resistance, speed, and METs.;Long Term: Improve cardiorespiratory fitness, muscular endurance and strength as measured by increased METs and functional capacity ( )     Able to understand and use rate of perceived exertion (RPE) scale Yes Yes Yes     Intervention Provide education and explanation on how to use RPE scale Provide education and explanation on how to use RPE scale Provide education and explanation on how to use RPE scale     Expected Outcomes Short Term: Able to use RPE daily in rehab to express subjective intensity level;Long Term:  Able to use RPE to guide intensity level when exercising independently Short Term: Able to use RPE daily in rehab to express subjective intensity level;Long Term:  Able to use RPE to guide intensity level when exercising independently Short Term: Able to use RPE daily in rehab to express subjective intensity level;Long Term:  Able to use RPE to guide intensity level when exercising independently     Able to understand and use Dyspnea scale Yes Yes Yes     Intervention Provide education and explanation on how to use Dyspnea scale Provide education and explanation on how to use Dyspnea scale Provide education and explanation on how to use Dyspnea scale     Expected Outcomes Short Term: Able to use Dyspnea scale daily in rehab to express subjective sense of shortness of breath during exertion;Long Term: Able to use Dyspnea scale to guide intensity level when exercising independently Short Term: Able to use Dyspnea scale daily in rehab to express subjective sense of shortness of breath during exertion;Long Term: Able to use Dyspnea scale to guide intensity level when exercising independently Short Term: Able  to use Dyspnea scale daily in rehab to express subjective sense of shortness of breath during exertion;Long Term: Able to use Dyspnea scale to guide intensity level when exercising independently     Knowledge and understanding of Target Heart Rate Range (THRR) Yes Yes Yes     Intervention Provide education and explanation of THRR including how the numbers were predicted and where they are located for reference Provide education and explanation of THRR including how the numbers were predicted and where they are located for reference Provide  education and explanation of THRR including how the numbers were predicted and where they are located for reference     Expected Outcomes Short Term: Able to state/look up THRR;Long Term: Able to use THRR to govern intensity when exercising independently;Short Term: Able to use daily as guideline for intensity in rehab Short Term: Able to state/look up THRR;Long Term: Able to use THRR to govern intensity when exercising independently;Short Term: Able to use daily as guideline for intensity in rehab Short Term: Able to state/look up THRR;Long Term: Able to use THRR to govern intensity when exercising independently;Short Term: Able to use daily as guideline for intensity in rehab     Understanding of Exercise Prescription Yes Yes Yes     Intervention Provide education, explanation, and written materials on patient's individual exercise prescription Provide education, explanation, and written materials on patient's individual exercise prescription Provide education, explanation, and written materials on patient's individual exercise prescription     Expected Outcomes Short Term: Able to explain program exercise prescription;Long Term: Able to explain home exercise prescription to exercise independently Short Term: Able to explain program exercise prescription;Long Term: Able to explain home exercise prescription to exercise independently Short Term: Able to explain program  exercise prescription;Long Term: Able to explain home exercise prescription to exercise independently            Exercise Goals Re-Evaluation :  Exercise Goals Re-Evaluation    Row Name 02/18/20 1500 03/17/20 0649           Exercise Goal Re-Evaluation   Exercise Goals Review Increase Physical Activity;Increase Strength and Stamina;Able to understand and use rate of perceived exertion (RPE) scale;Able to understand and use Dyspnea scale;Knowledge and understanding of Target Heart Rate Range (THRR);Understanding of Exercise Prescription Increase Physical Activity;Increase Strength and Stamina;Able to understand and use rate of perceived exertion (RPE) scale;Able to understand and use Dyspnea scale;Knowledge and understanding of Target Heart Rate Range (THRR);Understanding of Exercise Prescription      Comments Pt has completed 3 exercise sessions. She has a positive attitude and wants to become more active. She is currently exercising at 2.0 METs on the stepper. Will continue to monitor and progress as able. Pt has completed 8 exercise sessions. She still remains motivated and is progressing her workloads. She is currently exercising at 2.3 METs on the stepper. Will continue to monitor and progress as able.      Expected Outcomes Through exercise at rehab and at home, the patient will decrease shortness of breath with daily activities and feel confident in carrying out an exercise regime at home. Through exercise at rehab and at home, the patient will decrease shortness of breath with daily activities and feel confident in carrying out an exercise regime at home.             Discharge Exercise Prescription (Final Exercise Prescription Changes):  Exercise Prescription Changes - 03/17/20 1100      Response to Exercise   Blood Pressure (Admit) 118/66    Blood Pressure (Exercise) 126/62    Blood Pressure (Exit) 124/76    Heart Rate (Admit) 103 bpm    Heart Rate (Exercise) 111 bpm    Heart  Rate (Exit) 113 bpm    Oxygen Saturation (Admit) 100 %    Oxygen Saturation (Exercise) 97 %    Oxygen Saturation (Exit) 99 %    Rating of Perceived Exertion (Exercise) 14    Perceived Dyspnea (Exercise) 3    Duration Continue with 30 min of aerobic exercise  without signs/symptoms of physical distress.    Intensity THRR New      Progression   Progression Continue to progress workloads to maintain intensity without signs/symptoms of physical distress.      Resistance Training   Training Prescription Yes    Weight orange bands    Reps 10-15    Time 10 Minutes      Oxygen   Oxygen Continuous    Liters 3      NuStep   Level 2    SPM 80    Minutes 30    METs 2.3           Nutrition:  Target Goals: Understanding of nutrition guidelines, daily intake of sodium '1500mg'$ , cholesterol '200mg'$ , calories 30% from fat and 7% or less from saturated fats, daily to have 5 or more servings of fruits and vegetables.  Biometrics:  Pre Biometrics - 02/05/20 0955      Pre Biometrics   Height 5' 1.5" (1.562 m)    Weight 68.8 kg    BMI (Calculated) 28.2    Grip Strength 18 kg            Nutrition Therapy Plan and Nutrition Goals:  Nutrition Therapy & Goals - 02/18/20 1110      Nutrition Therapy   Diet Low Sodium      Personal Nutrition Goals   Nutrition Goal Pt to limit high sodium foods by reading labels    Personal Goal #2 Pt to build a healthy plate including vegetables, fruits, whole grains, and low-fat dairy products in a generally healthy meal plan.      Intervention Plan   Intervention Prescribe, educate and counsel regarding individualized specific dietary modifications aiming towards targeted core components such as weight, hypertension, lipid management, diabetes, heart failure and other comorbidities.    Expected Outcomes Short Term Goal: Understand basic principles of dietary content, such as calories, fat, sodium, cholesterol and nutrients.           Nutrition  Assessments:  Nutrition Assessments - 02/18/20 1111      Rate Your Plate Scores   Pre Score 43           Nutrition Goals Re-Evaluation:  Nutrition Goals Re-Evaluation    Row Name 02/18/20 1111 03/13/20 1427 03/17/20 1358         Goals   Current Weight 151 lb (68.5 kg) 152 lb (68.9 kg) 152 lb 1.9 oz (69 kg)     Nutrition Goal Pt to limit high sodium foods by reading labels Pt to limit high sodium foods by reading labels --     Comment -- Pt eating lower sodium items at home and reading more labels. --       Personal Goal #2 Re-Evaluation   Personal Goal #2 Pt to build a healthy plate including vegetables, fruits, whole grains, and low-fat dairy products in a generally healthy meal plan. Pt to build a healthy plate including vegetables, fruits, whole grains, and low-fat dairy products in a generally healthy meal plan. --            Nutrition Goals Discharge (Final Nutrition Goals Re-Evaluation):  Nutrition Goals Re-Evaluation - 03/17/20 1358      Goals   Current Weight 152 lb 1.9 oz (69 kg)           Psychosocial: Target Goals: Acknowledge presence or absence of significant depression and/or stress, maximize coping skills, provide positive support system. Participant is able to verbalize types and ability to  use techniques and skills needed for reducing stress and depression.  Initial Review & Psychosocial Screening:  Initial Psych Review & Screening - 02/05/20 1000      Initial Review   Current issues with None Identified      Family Dynamics   Good Support System? Yes      Barriers   Psychosocial barriers to participate in program There are no identifiable barriers or psychosocial needs.      Screening Interventions   Interventions Encouraged to exercise           Quality of Life Scores:  Scores of 19 and below usually indicate a poorer quality of life in these areas.  A difference of  2-3 points is a clinically meaningful difference.  A difference of 2-3  points in the total score of the Quality of Life Index has been associated with significant improvement in overall quality of life, self-image, physical symptoms, and general health in studies assessing change in quality of life.  PHQ-9: Recent Review Flowsheet Data    Depression screen [facility] 2/9 02/05/2020   Decreased Interest 0   Down, Depressed, Hopeless 0   PHQ - 2 Score 0   Altered sleeping 0   Tired, decreased energy 0   Change in appetite 0   Feeling bad or failure about yourself  0   Trouble concentrating 0   Moving slowly or fidgety/restless 0   Suicidal thoughts 0   PHQ-9 Score 0   Difficult doing work/chores Not difficult at all     Interpretation of Total Score  Total Score Depression Severity:  1-4 = Minimal depression, 5-9 = Mild depression, 10-14 = Moderate depression, 15-19 = Moderately severe depression, 20-27 = Severe depression   Psychosocial Evaluation and Intervention:  Psychosocial Evaluation - 02/05/20 1242      Psychosocial Evaluation & Interventions   Continue Psychosocial Services  No Follow up required           Psychosocial Re-Evaluation:  Psychosocial Re-Evaluation    Peters Name 02/17/20 0931 03/16/20 1307           Psychosocial Re-Evaluation   Current issues with None Identified None Identified      Comments No barriers or psychosocial concerns identified at this time. No concerns identified      Expected Outcomes For patient to continue to be free of barriers or psychosocial concerns while participating in pulmonary rehab. For patient to continue with no barriers or psychosocial concerns while participating in pulmonary rehab      Interventions Encouraged to attend Pulmonary Rehabilitation for the exercise Encouraged to attend Pulmonary Rehabilitation for the exercise      Continue Psychosocial Services  No Follow up required No Follow up required             Psychosocial Discharge (Final Psychosocial Re-Evaluation):  Psychosocial  Re-Evaluation - 03/16/20 1307      Psychosocial Re-Evaluation   Current issues with None Identified    Comments No concerns identified    Expected Outcomes For patient to continue with no barriers or psychosocial concerns while participating in pulmonary rehab    Interventions Encouraged to attend Pulmonary Rehabilitation for the exercise    Continue Psychosocial Services  No Follow up required           Education: Education Goals: Education classes will be provided on a weekly basis, covering required topics. Participant will state understanding/return demonstration of topics presented.  Learning Barriers/Preferences:  Learning Barriers/Preferences - 02/05/20 1243  Learning Barriers/Preferences   Learning Barriers None    Learning Preferences Audio;Computer/Internet;Group Instruction;Individual Instruction;Pictoral;Skilled Demonstration;Verbal Instruction;Video;Written Material           Education Topics: Risk Factor Reduction:  -Group instruction that is supported by a PowerPoint presentation. Instructor discusses the definition of a risk factor, different risk factors for pulmonary disease, and how the heart and lungs work together.     PULMONARY REHAB CHRONIC OBSTRUCTIVE PULMONARY DISEASE from 03/12/2020 in [place]  Date 03/12/20  Educator handout      Nutrition for Pulmonary Patient:  -Group instruction provided by PowerPoint slides, verbal discussion, and written materials to support subject matter. The instructor gives an explanation and review of healthy diet recommendations, which includes a discussion on weight management, recommendations for fruit and vegetable consumption, as well as protein, fluid, caffeine, fiber, sodium, sugar, and alcohol. Tips for eating when patients are short of breath are discussed.   Pursed Lip Breathing:  -Group instruction that is supported by demonstration and informational handouts. Instructor  discusses the benefits of pursed lip and diaphragmatic breathing and detailed demonstration on how to preform both.     PULMONARY REHAB CHRONIC OBSTRUCTIVE PULMONARY DISEASE from 02/25/2020 in [place]  Date 02/25/20  Educator Handout      Oxygen Safety:  -Group instruction provided by PowerPoint, verbal discussion, and written material to support subject matter. There is an overview of "What is Oxygen" and "Why do we need it".  Instructor also reviews how to create a safe environment for oxygen use, the importance of using oxygen as prescribed, and the risks of noncompliance. There is a brief discussion on traveling with oxygen and resources the patient may utilize.   Oxygen Equipment:  -Group instruction provided by [facility] Staff utilizing handouts, written materials, and equipment demonstrations.   Signs and Symptoms:  -Group instruction provided by written material and verbal discussion to support subject matter. Warning signs and symptoms of infection, stroke, and heart attack are reviewed and when to call the physician/911 reinforced. Tips for preventing the spread of infection discussed.   Advanced Directives:  -Group instruction provided by verbal instruction and written material to support subject matter. Instructor reviews Advanced Directive laws and proper instruction for filling out document.   Pulmonary Video:  -Group video education that reviews the importance of medication and oxygen compliance, exercise, good nutrition, pulmonary hygiene, and pursed lip and diaphragmatic breathing for the pulmonary patient.   Exercise for the Pulmonary Patient:  -Group instruction that is supported by a PowerPoint presentation. Instructor discusses benefits of exercise, core components of exercise, frequency, duration, and intensity of an exercise routine, importance of utilizing pulse oximetry during exercise, safety while exercising, and options of  places to exercise outside of rehab.     Pulmonary Medications:  -Verbally interactive group education provided by instructor with focus on inhaled medications and proper administration.   PULMONARY REHAB CHRONIC OBSTRUCTIVE PULMONARY DISEASE from 02/25/2020 in [place]  Date 02/11/20  Educator handout  Instruction Review Code 2- Demonstrated Understanding      Anatomy and Physiology of the Respiratory System and Intimacy:  -Group instruction provided by PowerPoint, verbal discussion, and written material to support subject matter. Instructor reviews respiratory cycle and anatomical components of the respiratory system and their functions. Instructor also reviews differences in obstructive and restrictive respiratory diseases with examples of each. Intimacy, Sex, and Sexuality differences are reviewed with a discussion on how relationships  can change when diagnosed with pulmonary disease. Common sexual concerns are reviewed.   MD DAY -A group question and answer session with a medical doctor that allows participants to ask questions that relate to their pulmonary disease state.   OTHER EDUCATION -Group or individual verbal, written, or video instructions that support the educational goals of the pulmonary rehab program.   PULMONARY REHAB CHRONIC OBSTRUCTIVE PULMONARY DISEASE from 03/03/2020 in [place]  Date 03/03/20  Educator Handout      Holiday Eating Survival Tips:  -Group instruction provided by PowerPoint slides, verbal discussion, and written materials to support subject matter. The instructor gives patients tips, tricks, and techniques to help them not only survive but enjoy the holidays despite the onslaught of food that accompanies the holidays.   Knowledge Questionnaire Score:  Knowledge Questionnaire Score - 02/05/20 1243      Knowledge Questionnaire Score   Pre Score patient did not complete            Core Components/Risk Factors/Patient Goals at Admission:  Personal Goals and Risk Factors at Admission - 02/05/20 1243      Core Components/Risk Factors/Patient Goals on Admission   Improve shortness of breath with ADL's Yes    Intervention Provide education, individualized exercise plan and daily activity instruction to help decrease symptoms of SOB with activities of daily living.    Expected Outcomes Short Term: Improve cardiorespiratory fitness to achieve a reduction of symptoms when performing ADLs;Long Term: Be able to perform more ADLs without symptoms or delay the onset of symptoms           Core Components/Risk Factors/Patient Goals Review:   Goals and Risk Factor Review    Row Name 02/05/20 1244 02/17/20 0932 03/16/20 1317 03/16/20 1324       Core Components/Risk Factors/Patient Goals Review   Personal Goals Review Increase knowledge of respiratory medications and ability to use respiratory devices properly.;Improve shortness of breath with ADL's;Develop more efficient breathing techniques such as purse lipped breathing and diaphragmatic breathing and practicing self-pacing with activity. Increase knowledge of respiratory medications and ability to use respiratory devices properly.;Improve shortness of breath with ADL's;Develop more efficient breathing techniques such as purse lipped breathing and diaphragmatic breathing and practicing self-pacing with activity. Increase knowledge of respiratory medications and ability to use respiratory devices properly.;Improve shortness of breath with ADL's;Develop more efficient breathing techniques such as purse lipped breathing and diaphragmatic breathing and practicing self-pacing with activity. --    Review -- Trude just started program, has attended 2 exercise sessions, too early to have met program goals. Keymani has increased her workload on the nustep to 2.3 mets, she seems to be enjoying the program and her attendance has been good. --     Expected Outcomes -- See admission goals. To continue increasing her workloads as tolerated. See admission goals.           Core Components/Risk Factors/Patient Goals at Discharge (Final Review):   Goals and Risk Factor Review - 03/16/20 1324      Core Components/Risk Factors/Patient Goals Review   Expected Outcomes See admission goals.           ITP Comments:   Comments: ITP REVIEW Pt is making expected progress toward pulmonary rehab goals after completing 9 sessions. Recommend continued exercise, life style modification, education, and utilization of breathing techniques to increase stamina and strength and decrease shortness of breath with exertion.

## 2020-03-24 ENCOUNTER — Other Ambulatory Visit: Payer: Self-pay

## 2020-03-24 ENCOUNTER — Encounter (HOSPITAL_COMMUNITY)
Admission: RE | Admit: 2020-03-24 | Discharge: 2020-03-24 | Disposition: A | Discharge status: Home / Self Care | Payer: Medicare Other | Source: Ambulatory Visit | Attending: Pulmonary Disease | Admitting: Pulmonary Disease

## 2020-03-24 DIAGNOSIS — J449 Chronic obstructive pulmonary disease, unspecified: Secondary | ICD-10-CM

## 2020-03-24 NOTE — Progress Notes (Signed)
Daily Session Note  Patient Details  Name: Katie Waller MRN: [id number] Date of Birth: 07-Sep-1945 Referring Provider:     Pulmonary Rehab Walk Test from 02/05/2020 in [place]  Referring Provider Dr. Marshell Garfinkel MD      Encounter Date: 03/24/2020  Check In:  Session Check In - 03/24/20 1018      Check-In   Supervising physician immediately available to respond to emergencies See telemetry face sheet for immediately available MD    Physician(s) Dr. Pietro Cassis    Location [facility]    Staff Present [name], RN, Bjorn Loser, MS, CEP, Exercise Physiologist;Lisa Jani Gravel, MS, ACSM-CEP, Exercise Physiologist    Virtual Visit No    Medication changes reported     No    Fall or balance concerns reported    No    Tobacco Cessation No Change    Warm-up and Cool-down Performed as group-led instruction    Resistance Training Performed Yes    VAD Patient? No    PAD/SET Patient? No      Pain Assessment   Currently in Pain? No/denies    Multiple Pain Sites No           Capillary Blood Glucose: No results found for this or any previous visit (from the past 24 hour(s)).    Social History   Tobacco Use  Smoking Status Former Smoker   Packs/day: 1.00   Years: 55.00   Pack years: 55.00   Types: Cigarettes   Start date: 07/23/2017   Quit date: 07/23/2017   Years since quitting: 2.6  Smokeless Tobacco Never Used    Goals Met:  Proper associated with RPD/PD & O2 Sat Exercise tolerated well Strength training completed today  Goals Unmet:  Not Applicable  Comments: Service time is from 0950 to 1050.   Dr. Fransico Him is Medical Director for Cardiac Rehab at [facility].

## 2020-03-26 ENCOUNTER — Encounter (HOSPITAL_COMMUNITY)
Admission: RE | Admit: 2020-03-26 | Discharge: 2020-03-26 | Disposition: A | Discharge status: Home / Self Care | Payer: Medicare Other | Source: Ambulatory Visit | Attending: Pulmonary Disease | Admitting: Pulmonary Disease

## 2020-03-26 ENCOUNTER — Other Ambulatory Visit: Payer: Self-pay

## 2020-03-26 DIAGNOSIS — J449 Chronic obstructive pulmonary disease, unspecified: Secondary | ICD-10-CM | POA: Diagnosis not present

## 2020-03-26 NOTE — Progress Notes (Signed)
Daily Session Note  Patient Details  Name: Katie Waller MRN: [id number] Date of Birth: 1946-02-20 Referring Provider:     Pulmonary Rehab Walk Test from 02/05/2020 in [place]  Referring Provider Dr. Marshell Garfinkel MD      Encounter Date: 03/26/2020  Check In:  Session Check In - 03/26/20 0952      Check-In   Supervising physician immediately available to respond to emergencies [facility] immediately available    Physician(s) Dr. Pietro Cassis    [facility]    Staff Present [name], RN, Bjorn Loser, MS, CEP, Exercise Physiologist;Lisa Jani Gravel, MS, ACSM-CEP, Exercise Physiologist    Virtual Visit No    Medication changes reported     No    Fall or balance concerns reported    No    Tobacco Cessation No Change    Warm-up and Cool-down Performed on first and last piece of equipment    Resistance Training Performed Yes    VAD Patient? No    PAD/SET Patient? No      Pain Assessment   Currently in Pain? No/denies    Multiple Pain Sites No           Capillary Blood Glucose: No results found for this or any previous visit (from the past 24 hour(s)).    Social History   Tobacco Use  Smoking Status Former Smoker  . Packs/day: 1.00  . Years: 55.00  . Pack years: 55.00  . Types: Cigarettes  . Start date: 07/23/2017  . Quit date: 07/23/2017  . Years since quitting: 2.6  Smokeless Tobacco Never Used    Goals Met:  Independence with exercise equipment Exercise tolerated well No report of cardiac concerns or symptoms Strength training completed today  Goals Unmet:  Not Applicable  Comments: Service time is from 0950 to 1055    Dr. Fransico Him is Medical Director for Cardiac Rehab at [facility].

## 2020-03-31 ENCOUNTER — Other Ambulatory Visit: Payer: Self-pay

## 2020-03-31 ENCOUNTER — Encounter (HOSPITAL_COMMUNITY)
Admission: RE | Admit: 2020-03-31 | Discharge: 2020-03-31 | Disposition: A | Discharge status: Home / Self Care | Payer: Medicare Other | Source: Ambulatory Visit | Attending: Pulmonary Disease | Admitting: Pulmonary Disease

## 2020-03-31 VITALS — Wt 153.7 lb

## 2020-03-31 DIAGNOSIS — J449 Chronic obstructive pulmonary disease, unspecified: Secondary | ICD-10-CM | POA: Diagnosis not present

## 2020-03-31 NOTE — Progress Notes (Signed)
Daily Session Note  Patient Details  Name: Katie Waller MRN: [id number] Date of Birth: 1946-02-10 Referring Provider:     Pulmonary Rehab Walk Test from 02/05/2020 in [place]  Referring Provider Dr. Marshell Garfinkel MD      Encounter Date: 03/31/2020  Check In:  Session Check In - 03/31/20 0953      Check-In   Supervising physician immediately available to respond to emergencies [facility] immediately available    Physician(s) Dr. Jamse Arn    [facility]    Staff Present Hoy Register, MS, CEP, Exercise Physiologist;Lisa Jani Gravel, MS, ACSM-CEP, Exercise Physiologist    Virtual Visit No    Medication changes reported     No    Fall or balance concerns reported    No    Tobacco Cessation No Change    Warm-up and Cool-down Performed on first and last piece of equipment    Resistance Training Performed Yes    VAD Patient? No    PAD/SET Patient? No      Pain Assessment   Currently in Pain? No/denies    Multiple Pain Sites No           Capillary Blood Glucose: No results found for this or any previous visit (from the past 24 hour(s)).   Exercise Prescription Changes - 03/31/20 1100      Response to Exercise   Blood Pressure (Admit) 124/72    Blood Pressure (Exercise) 152/68    Blood Pressure (Exit) 140/70    Heart Rate (Admit) 114 bpm    Heart Rate (Exercise) 125 bpm    Heart Rate (Exit) 118 bpm    Oxygen Saturation (Admit) 95 %    Oxygen Saturation (Exercise) 100 %    Oxygen Saturation (Exit) 99 %    Rating of Perceived Exertion (Exercise) 11    Perceived Dyspnea (Exercise) 1    Duration Continue with 30 min of aerobic exercise without signs/symptoms of physical distress.    Intensity THRR unchanged      Progression   Progression Continue to progress workloads to maintain intensity without signs/symptoms of physical distress.      Resistance Training   Training  Prescription Yes    Weight orange bands    Reps 10-15    Time 10 Minutes      Oxygen   Oxygen Continuous    Liters 3      NuStep   Level 3    SPM 80    Minutes 30    METs 1.8           Social History   Tobacco Use  Smoking Status Former Smoker  . Packs/day: 1.00  . Years: 55.00  . Pack years: 55.00  . Types: Cigarettes  . Start date: 07/23/2017  . Quit date: 07/23/2017  . Years since quitting: 2.6  Smokeless Tobacco Never Used    Goals Met:  Independence with exercise equipment Exercise tolerated well No report of cardiac concerns or symptoms Strength training completed today  Goals Unmet:  Not Applicable  Comments: Service time is from 0945 to 1045    Dr. Kathie Dike is Medical Director for [facility] Pulmonary Rehab.

## 2020-04-02 ENCOUNTER — Encounter (HOSPITAL_COMMUNITY)
Admission: RE | Admit: 2020-04-02 | Discharge: 2020-04-02 | Disposition: A | Discharge status: Home / Self Care | Payer: Medicare Other | Source: Ambulatory Visit | Attending: Pulmonary Disease | Admitting: Pulmonary Disease

## 2020-04-02 ENCOUNTER — Other Ambulatory Visit: Payer: Self-pay

## 2020-04-02 DIAGNOSIS — J449 Chronic obstructive pulmonary disease, unspecified: Secondary | ICD-10-CM

## 2020-04-02 NOTE — Progress Notes (Signed)
Daily Session Note  Patient Details  Name: Katie Waller MRN: [id number] Date of Birth: [birth year] Referring Provider:     Pulmonary Rehab Walk Test from 02/05/2020 in [place]  Referring Provider Dr. Marshell Garfinkel MD      Encounter Date: 04/02/2020  Check In:  Session Check In - 04/02/20 1001      Check-In   Supervising physician immediately available to respond to emergencies [facility] immediately available    Physician(s) Dr. Wyline Copas    [facility]    Staff Present Maurice Small, RN, Bjorn Loser, MS, CEP, Exercise Physiologist;Peighton Edgin [name], RN;David Wood-Ridge, MS, EP-C, CCRP    Virtual Visit No    Medication changes reported     No    Fall or balance concerns reported    No    Tobacco Cessation No Change    Warm-up and Cool-down Performed on first and last piece of equipment    Resistance Training Performed Yes    VAD Patient? No    PAD/SET Patient? No      Pain Assessment   Currently in Pain? No/denies    Multiple Pain Sites No           Capillary Blood Glucose: No results found for this or any previous visit (from the past 24 hour(s)).    Social History   Tobacco Use  Smoking Status Former Smoker  . Packs/day: 1.00  . Years: 55.00  . Pack years: 55.00  . Types: Cigarettes  . Start date: 07/23/2017  . Quit date: 07/23/2017  . Years since quitting: 2.6  Smokeless Tobacco Never Used    Goals Met:  Exercise tolerated well No report of cardiac concerns or symptoms Strength training completed today  Goals Unmet:  Not Applicable  Comments: Service time is from 0945 to 37    Dr. Fransico Him is Medical Director for Cardiac Rehab at [facility].

## 2020-04-04 ENCOUNTER — Other Ambulatory Visit: Payer: Self-pay | Admitting: Cardiology

## 2020-04-07 ENCOUNTER — Encounter (HOSPITAL_COMMUNITY)
Admission: RE | Admit: 2020-04-07 | Discharge: 2020-04-07 | Disposition: A | Discharge status: Home / Self Care | Payer: Medicare Other | Source: Ambulatory Visit | Attending: Pulmonary Disease | Admitting: Pulmonary Disease

## 2020-04-07 ENCOUNTER — Other Ambulatory Visit: Payer: Self-pay

## 2020-04-07 DIAGNOSIS — J449 Chronic obstructive pulmonary disease, unspecified: Secondary | ICD-10-CM | POA: Insufficient documentation

## 2020-04-07 NOTE — Progress Notes (Signed)
Daily Session Note  Patient Details  Name: Katie Waller MRN: [id number] Date of Birth: Sep 01, 1946 Referring Provider:     Pulmonary Rehab Walk Test from 02/05/2020 in [place]  Referring Provider Dr. Marshell Garfinkel MD      Encounter Date: 04/07/2020  Check In:  Session Check In - 04/07/20 0956      Check-In   Supervising physician immediately available to respond to emergencies [facility] immediately available    Physician(s) Dr. Levora Angel    [facility]    Staff Present Hoy Register, MS, CEP, Exercise Physiologist;Lisa Jani Gravel, MS, ACSM-CEP, Exercise Physiologist    Virtual Visit No    Medication changes reported     No    Fall or balance concerns reported    No    Tobacco Cessation No Change    Warm-up and Cool-down Performed on first and last piece of equipment    Resistance Training Performed Yes    VAD Patient? No    PAD/SET Patient? No      Pain Assessment   Currently in Pain? No/denies    Multiple Pain Sites No           Capillary Blood Glucose: No results found for this or any previous visit (from the past 24 hour(s)).    Social History   Tobacco Use  Smoking Status Former Smoker  . Packs/day: 1.00  . Years: 55.00  . Pack years: 55.00  . Types: Cigarettes  . Start date: 07/23/2017  . Quit date: 07/23/2017  . Years since quitting: 2.7  Smokeless Tobacco Never Used    Goals Met:  Independence with exercise equipment Exercise tolerated well Strength training completed today  Goals Unmet:  Not Applicable  Comments: Service time is from 0950 to 1100    Dr. Fransico Him is Medical Director for Cardiac Rehab at [facility].

## 2020-04-08 ENCOUNTER — Ambulatory Visit (INDEPENDENT_AMBULATORY_CARE_PROVIDER_SITE_OTHER): Payer: Medicare Other | Admitting: Pulmonary Disease

## 2020-04-08 ENCOUNTER — Encounter: Payer: Self-pay | Admitting: Pulmonary Disease

## 2020-04-08 VITALS — BP 122/82 | HR 81 | Temp 98.3°F | Ht 61.0 in | Wt 151.8 lb

## 2020-04-08 DIAGNOSIS — J449 Chronic obstructive pulmonary disease, unspecified: Secondary | ICD-10-CM

## 2020-04-08 NOTE — Addendum Note (Signed)
Addended by: Luna Kitchens D on: 04/08/2020 09:36 AM   Modules accepted: Orders

## 2020-04-08 NOTE — Patient Instructions (Signed)
I am glad you are doing well with regard to your breathing. Continue the inhalers and pulmonary rehab I strongly encourage you to get the COVID-19 vaccine Follow-up in 1 year.

## 2020-04-08 NOTE — Progress Notes (Signed)
Katie Waller    [id number]    12/12/1945  Primary Care Physician:Waller, Katie Maduro, MD  Referring Physician: Elias Else, MD [phone] Katie Waller [address] [place],  [place] [address]  Chief complaint: Follow-up for COPD GOLD D  HPI: 74 year old with severe COPD [CAT Score 13].   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  She is placed on BiPAP, treated with steroids, antibiotics, nebulizer.  Also noted to have acute on chronic diastolic heart failure which was treated with IV Lasix.  Discharged to rehab facility.  She is since returned to home and continues with exercise regimen at home with home health.    Did not like Brovana and Pulmicort since she does not like a mask on the face TriedTrelegy in march 2019 but preferred to go back on symbicort and spiriva  Pets: Dogs.  No cats, birds, farm animals Occupation: Retired as an Print production planner.   Exposures: No known exposures, no mold, hot tub. Smoking history: 55-pack-year smoking.  Quit in November 2018 Travel History: Not significant  Interim History: She had Covid-like illness in December 2020 after exposure in the family.  She was never tested. Follow-up screening CT in December did not earlier this year did not show any interstitial abnormalities  Currently in pulmonary rehab.  States that breathing is stable.  Outpatient Encounter Medications as of 04/08/2020  Medication Sig  . albuterol (PROVENTIL HFA;VENTOLIN HFA) 108 (90 BASE) MCG/ACT inhaler Inhale 2 puffs into the lungs every 6 (six) hours as needed for wheezing or shortness of breath.  Marland Kitchen amLODipine (NORVASC) 5 MG tablet Take 5 mg by mouth every morning.  Marland Kitchen aspirin 81 MG chewable tablet Chew 1 tablet (81 mg total) by mouth daily. (Patient taking differently: Chew 81 mg by mouth at bedtime. )  . benzonatate (TESSALON) 100 MG capsule Take 100 mg by mouth 3 (three) times daily as needed for cough.   .  budesonide-formoterol (SYMBICORT) 160-4.5 MCG/ACT inhaler Inhale 2 puffs into the lungs 2 (two) times daily.  . Calcium 600-200 MG-UNIT tablet Take 1 tablet by mouth daily.  . furosemide (LASIX) 40 MG tablet Take 40 mg by mouth daily as needed for fluid or edema.   . gabapentin (NEURONTIN) 300 MG capsule Take 300 mg by mouth at bedtime.  Marland Kitchen ipratropium-albuterol (DUONEB) 0.5-2.5 (3) MG/3ML SOLN Take 3 mLs by nebulization 3 (three) times daily. DX: J44.1 (Patient taking differently: Take 3 mLs by nebulization as needed. DX: J44.1)  . LORazepam (ATIVAN) 1 MG tablet Take 1 tablet (1 mg total) by mouth every 6 (six) hours as needed for anxiety.  Marland Kitchen losartan (COZAAR) 50 MG tablet Take 50 mg by mouth daily.  . Multiple Vitamin (MULTIVITAMIN) tablet Take 1 tablet by mouth daily.  . ondansetron (ZOFRAN ODT) 4 MG disintegrating tablet Take 1 tablet (4 mg total) by mouth every 8 (eight) hours as needed for nausea or vomiting.  . tiotropium (SPIRIVA) 18 MCG inhalation capsule Place 18 mcg into inhaler and inhale daily.  . traZODone (DESYREL) 100 MG tablet Take 1 tablet (100 mg total) by mouth at bedtime as needed for sleep. (Patient taking differently: Take 100 mg by mouth at bedtime. )  . [DISCONTINUED] ranolazine (RANEXA) 500 MG 12 hr tablet TAKE 1 TABLET BY MOUTH TWICE A DAY   No facility-administered encounter medications on file as of 04/08/2020.   Physical Exam: Blood pressure 122/82, pulse 81, temperature 98.3 F (36.8 C),  temperature source Oral, height 5\' 1"  (1.549 m), weight 151 lb 12.8 oz (68.9 kg), SpO2 94 %. Gen:      No acute distress HEENT:  EOMI, sclera anicteric Neck:     No masses; no thyromegaly Lungs:    Clear to auscultation bilaterally; normal respiratory effort CV:         Regular rate and rhythm; no murmurs Abd:      + bowel sounds; soft, non-tender; no palpable masses, no distension Ext:    No edema; adequate peripheral perfusion Skin:      Warm and dry; no rash Neuro: alert and  oriented x 3 Psych: normal mood and affect  Data Reviewed: Imaging CT chest 07/31/17-no pulmonary embolism, emphysematous changes, dependent atelectasis.  Low-density lesion in the liver.  Screening CT chest 09/03/2018-subcentimeter pulmonary nodules, emphysema  Screening CT chest 10/09/2019-severe emphysema, stable subcentimeter pulmonary nodules.  Low-density lesion in the liver.  I have reviewed the images personally. I have reviewed the images personally  Liver 08/02/17-Small Cystic Areas within the Left hepatic lobe compatible with benign cyst.  Follow-up imaging recommended in 6-12 months.   PFTs 10/02/17 FVC 1.6 [60%], FEV1 0.73 [36%], F/F 45, TLC 129%, 3/0 369%, DLCO 33% Severe obstruction, bronchodilator response, hyperinflation with air trapping, severe diffusion impairment.  Labs: CBC differential 12/24/2018-WBC 11.3, eos 1%, absolute eosinophil count 113 IgE 05/06/2018-133 Alpha-1 antitrypsin 09/05/2017-178, PI MM  Cardiac Echo 11/22/2018 LVEF 55-60%, mild LVH, no pulmonary hypertension  Assessment:  COPD Gold D Continue on Symbicort 160/4.5 and Spiriva. Continue supplemental oxygen via portable concentrator.  On pulmonary rehab now  Former smoker Screening CTs reviewed with no concerning findings  Health maintenance 2/90/15- Prevnar 12/08/2014-Pneumovax  She is hesitant to get COVID-19 vaccine due to fear of side effects.  Discussed with patient and daughter today and strongly encouraged her to get  Plan/Recommendations: -Continue Symbicort, Spiriva - Supplemental oxygen, rehab - Low-dose screening CT  02/07/2015 MD [facility] 04/08/2020, 8:58 AM  CC: 06/08/2020, MD

## 2020-04-09 ENCOUNTER — Encounter (HOSPITAL_COMMUNITY): Payer: Medicare Other

## 2020-04-09 ENCOUNTER — Telehealth (HOSPITAL_COMMUNITY): Payer: Self-pay | Admitting: Family Medicine

## 2020-04-24 ENCOUNTER — Ambulatory Visit (HOSPITAL_BASED_OUTPATIENT_CLINIC_OR_DEPARTMENT_OTHER)
Admission: RE | Admit: 2020-04-24 | Discharge: 2020-04-24 | Disposition: A | Discharge status: Home / Self Care | Payer: Medicare Other | Source: Ambulatory Visit | Attending: Cardiology | Admitting: Cardiology

## 2020-04-24 ENCOUNTER — Other Ambulatory Visit: Payer: Self-pay

## 2020-04-24 DIAGNOSIS — I5032 Chronic diastolic (congestive) heart failure: Secondary | ICD-10-CM | POA: Diagnosis present

## 2020-04-24 LAB — ECHOCARDIOGRAM COMPLETE
AR max vel: 1.49 cm2
AV Area VTI: 1.78 cm2
AV Area mean vel: 1.44 cm2
AV Mean grad: 4 mmHg
AV Peak grad: 7.6 mmHg
Ao pk vel: 1.38 m/s
Area-P 1/2: 4.96 cm2
Calc EF: 56.6 %
S' Lateral: 2.41 cm
Single Plane A2C EF: 47.9 %
Single Plane A4C EF: 60.2 %

## 2020-04-30 NOTE — Addendum Note (Signed)
Encounter addended by: Randell Loop, RD on: 04/30/2020 7:03 AM  Actions taken: Flowsheet accepted

## 2020-05-04 ENCOUNTER — Ambulatory Visit: Payer: Medicare Other | Admitting: Cardiology

## 2020-05-08 ENCOUNTER — Other Ambulatory Visit: Payer: Self-pay

## 2020-05-08 ENCOUNTER — Encounter: Payer: Self-pay | Admitting: Cardiology

## 2020-05-08 ENCOUNTER — Ambulatory Visit (INDEPENDENT_AMBULATORY_CARE_PROVIDER_SITE_OTHER): Payer: Medicare Other | Admitting: Cardiology

## 2020-05-08 VITALS — BP 170/92 | HR 90 | Ht 61.0 in | Wt 151.0 lb

## 2020-05-08 DIAGNOSIS — R000 Tachycardia, unspecified: Secondary | ICD-10-CM

## 2020-05-08 DIAGNOSIS — I5032 Chronic diastolic (congestive) heart failure: Secondary | ICD-10-CM | POA: Diagnosis not present

## 2020-05-08 DIAGNOSIS — R7303 Prediabetes: Secondary | ICD-10-CM | POA: Diagnosis not present

## 2020-05-08 DIAGNOSIS — I10 Essential (primary) hypertension: Secondary | ICD-10-CM | POA: Diagnosis not present

## 2020-05-08 DIAGNOSIS — J449 Chronic obstructive pulmonary disease, unspecified: Secondary | ICD-10-CM

## 2020-05-08 DIAGNOSIS — Z79899 Other long term (current) drug therapy: Secondary | ICD-10-CM

## 2020-05-08 MED ORDER — ATORVASTATIN CALCIUM 10 MG PO TABS
10.0000 mg | ORAL_TABLET | Freq: Every day | ORAL | 1 refills | Status: DC
Start: 2020-05-08 — End: 2020-11-09

## 2020-05-08 NOTE — Patient Instructions (Signed)
Medication Instructions:  Your physician has recommended you make the following change in your medication:   START: Lipitor 10 mg daily   *If you need a refill on your cardiac medications before your next appointment, please call your pharmacy*   Lab Work: Your physician recommends that you return for lab work today: tsh, probnp, ast alt    Your physician recommends that you return for lab work in 6 weeks fasting: Lipid, ast alt   If you have labs (blood work) drawn today and your tests are completely normal, you will receive your results only by: Marland Kitchen MyChart Message (if you have MyChart) OR . A paper copy in the mail If you have any lab test that is abnormal or we need to change your treatment, we will call you to review the results.   Testing/Procedures: None.    Follow-Up: At [facility], you and your health needs are our priority.  As part of our continuing mission to provide you with exceptional heart care, we have created designated Provider Care Teams.  These Care Teams include your primary Cardiologist (physician) and Advanced Practice Providers (APPs -  Physician Assistants and Nurse Practitioners) who all work together to provide you with the care you need, when you need it.  We recommend signing up for the patient portal called "MyChart".  Sign up information is provided on this After Visit Summary.  MyChart is used to connect with patients for Virtual Visits (Telemedicine).  Patients are able to view lab/test results, encounter notes, upcoming appointments, etc.  Non-urgent messages can be sent to your provider as well.   To learn more about what you can do with MyChart, go to [email or web address].    Your next appointment:   5 month(s)  The format for your next appointment:   In Person  Provider:   Gypsy Balsam, MD   Other Instructions  Atorvastatin tablets What is this medicine? ATORVASTATIN (a TORE va sta tin) is known as a HMG-CoA reductase  inhibitor or 'statin'. It lowers the level of cholesterol and triglycerides in the blood. This drug may also reduce the risk of heart attack, stroke, or other health problems in patients with risk factors for heart disease. Diet and lifestyle changes are often used with this drug. This medicine may be used for other purposes; ask your health care provider or pharmacist if you have questions. COMMON BRAND NAME(S): Lipitor What should I tell my health care provider before I take this medicine? They need to know if you have any of these conditions:  diabetes  if you often drink alcohol  history of stroke  kidney disease  liver disease  muscle aches or weakness  thyroid disease  an unusual or allergic reaction to atorvastatin, other medicines, foods, dyes, or preservatives  pregnant or trying to get pregnant  breast-feeding How should I use this medicine? Take this medicine by mouth with a glass of water. Follow the directions on the prescription label. You can take it with or without food. If it upsets your stomach, take it with food. Do not take with grapefruit juice. Take your medicine at regular intervals. Do not take it more often than directed. Do not stop taking except on your doctor's advice. Talk to your pediatrician regarding the use of this medicine in children. While this drug may be prescribed for children as young as 10 for selected conditions, precautions do apply. Overdosage: If you think you have taken too much of this medicine contact a poison control  center or emergency room at once. NOTE: This medicine is only for you. Do not share this medicine with others. What if I miss a dose? If you miss a dose, take it as soon as you can. If your next dose is to be taken in less than 12 hours, then do not take the missed dose. Take the next dose at your regular time. Do not take double or extra doses. What may interact with this medicine? Do not take this medicine with any of  the following medications:  dasabuvir; ombitasvir; paritaprevir; ritonavir  ombitasvir; paritaprevir; ritonavir  posaconazole  red yeast rice This medicine may also interact with the following medications:  alcohol  birth control pills  certain antibiotics like erythromycin and clarithromycin  certain antivirals for HIV or hepatitis  certain medicines for cholesterol like fenofibrate, gemfibrozil, and niacin  certain medicines for fungal infections like ketoconazole and itraconazole  colchicine  cyclosporine  digoxin  grapefruit juice  rifampin This list may not describe all possible interactions. Give your health care provider a list of all the medicines, herbs, non-prescription drugs, or dietary supplements you use. Also tell them if you smoke, drink alcohol, or use illegal drugs. Some items may interact with your medicine. What should I watch for while using this medicine? Visit your doctor or health care professional for regular check-ups. You may need regular tests to make sure your liver is working properly. Your health care professional may tell you to stop taking this medicine if you develop muscle problems. If your muscle problems do not go away after stopping this medicine, contact your health care professional. Do not become pregnant while taking this medicine. Women should inform their health care professional if they wish to become pregnant or think they might be pregnant. There is a potential for serious side effects to an unborn child. Talk to your health care professional or pharmacist for more information. Do not breast-feed an infant while taking this medicine. This medicine may increase blood sugar. Ask your healthcare provider if changes in diet or medicines are needed if you have diabetes. If you are going to need surgery or other procedure, tell your doctor that you are using this medicine. This drug is only part of a total heart-health program. Your  doctor or a dietician can suggest a low-cholesterol and low-fat diet to help. Avoid alcohol and smoking, and keep a proper exercise schedule. This medicine may cause a decrease in Co-Enzyme Q-10. You should make sure that you get enough Co-Enzyme Q-10 while you are taking this medicine. Discuss the foods you eat and the vitamins you take with your health care professional. What side effects may I notice from receiving this medicine? Side effects that you should report to your doctor or health care professional as soon as possible:  allergic reactions like skin rash, itching or hives, swelling of the face, lips, or tongue  fever  joint pain  loss of memory  redness, blistering, peeling or loosening of the skin, including inside the mouth  signs and symptoms of high blood sugar such as being more thirsty or hungry or having to urinate more than normal. You may also feel very tired or have blurry vision.  signs and symptoms of liver injury like dark yellow or brown urine; general ill feeling or flu-like symptoms; light-belly pain; unusually weak or tired; yellowing of the eyes or skin  signs and symptoms of muscle injury like dark urine; trouble passing urine or change in the amount of  urine; unusually weak or tired; muscle pain or side or back pain Side effects that usually do not require medical attention (report to your doctor or health care professional if they continue or are bothersome):  diarrhea  nausea  stomach pain  trouble sleeping  upset stomach This list may not describe all possible side effects. Call your doctor for medical advice about side effects. You may report side effects to FDA at 1-800-FDA-1088. Where should I keep my medicine? Keep out of the reach of children. Store between 20 and 25 degrees C (68 and 77 degrees F). Throw away any unused medicine after the expiration date. NOTE: This sheet is a summary. It may not cover all possible information. If you have  questions about this medicine, talk to your doctor, pharmacist, or health care provider.  2020 Elsevier/Gold Standard (2018-06-13 11:36:16)

## 2020-05-08 NOTE — Progress Notes (Signed)
Cardiology Office Note:    Date:  05/08/2020   ID:  Katie Waller, DOB 1945-10-01, MRN [id number]  PCP:  Elias Else, MD  Cardiologist:  Gypsy Balsam, MD    Referring MD: Elias Else, MD   Chief Complaint  Patient presents with  . Follow-up    Echo  I am doing fine  History of Present Illness:    Katie Waller is a 74 y.o. female with past medical history significant for COPD, essential hypertension, she does have diagnosis of congestive heart failure as well.  He was sent to me because of persistent sinus tachycardia.  She did have echocardiogram trying to assess her left ventricle ejection fraction which showed preserved left ventricle ejection fraction, there was diastolic abnormality but only type I which is relaxation of normality which is fairly typical for people more than 72 years old.  Her tachycardia is still present her heart rate is about 90 today.  Overall she is doing well she gets short of breath easily while walking and sometimes tired.  Denies have any chest pain tightness squeezing pressure burning chest no dizziness or passing out.  Past Medical History:  Diagnosis Date  . Asthma   . Diabetes (HCC)   . Heart disease   . Hypertension     Past Surgical History:  Procedure Laterality Date  . KNEE SURGERY Left     Current Medications: Current Meds  Medication Sig  . albuterol (PROVENTIL HFA;VENTOLIN HFA) 108 (90 BASE) MCG/ACT inhaler Inhale 2 puffs into the lungs every 6 (six) hours as needed for wheezing or shortness of breath.  Marland Kitchen amLODipine (NORVASC) 5 MG tablet Take 5 mg by mouth every morning.  Marland Kitchen aspirin 81 MG chewable tablet Chew 1 tablet (81 mg total) by mouth daily.  . benzonatate (TESSALON) 100 MG capsule Take 100 mg by mouth 3 (three) times daily as needed for cough.   . budesonide-formoterol (SYMBICORT) 160-4.5 MCG/ACT inhaler Inhale 2 puffs into the lungs 2 (two) times daily.  . Calcium 600-200 MG-UNIT tablet Take 1 tablet by mouth  daily.  . furosemide (LASIX) 40 MG tablet Take 40 mg by mouth daily as needed for fluid or edema.   . gabapentin (NEURONTIN) 300 MG capsule Take 300 mg by mouth at bedtime.  Marland Kitchen LORazepam (ATIVAN) 1 MG tablet Take 1 tablet (1 mg total) by mouth every 6 (six) hours as needed for anxiety.  Marland Kitchen losartan (COZAAR) 50 MG tablet Take 50 mg by mouth daily.  . Multiple Vitamin (MULTIVITAMIN) tablet Take 1 tablet by mouth daily.  . ondansetron (ZOFRAN ODT) 4 MG disintegrating tablet Take 1 tablet (4 mg total) by mouth every 8 (eight) hours as needed for nausea or vomiting.  . tiotropium (SPIRIVA) 18 MCG inhalation capsule Place 18 mcg into inhaler and inhale daily.  . traZODone (DESYREL) 100 MG tablet Take 1 tablet (100 mg total) by mouth at bedtime as needed for sleep.  . [DISCONTINUED] ipratropium-albuterol (DUONEB) 0.5-2.5 (3) MG/3ML SOLN Take 3 mLs by nebulization 3 (three) times daily. DX: J44.1     Allergies:   Ace inhibitors and Codeine   Social History   Socioeconomic History  . Marital status: Divorced    Spouse name: Not on file  . Number of children: Not on file  . Years of education: Not on file  . Highest education level: Not on file  Occupational History  . Not on file  Tobacco Use  . Smoking status: Former Smoker    Packs/day:  1.00    Years: 55.00    Pack years: 55.00    Types: Cigarettes    Start date: 07/23/2017    Quit date: 07/23/2017    Years since quitting: 2.7  . Smokeless tobacco: Never Used  Vaping Use  . Vaping Use: Former  Substance and Sexual Activity  . Alcohol use: No  . Drug use: No  . Sexual activity: Never  Other Topics Concern  . Not on file  Social History Narrative  . Not on file   Social Determinants of Health   Financial Resource Strain:   . Difficulty of Paying Living Expenses: Not on file  Food Insecurity:   . Worried About Programme researcher, broadcasting/film/video in the Last Year: Not on file  . Ran Out of Food in the Last Year: Not on file  Transportation  Needs:   . Lack of Transportation (Medical): Not on file  . Lack of Transportation (Non-Medical): Not on file  Physical Activity:   . Days of Exercise per Week: Not on file  . Minutes of Exercise per Session: Not on file  Stress:   . Feeling of Stress : Not on file  Social Connections:   . Frequency of Communication with Friends and Family: Not on file  . Frequency of Social Gatherings with Friends and Family: Not on file  . Attends Religious Services: Not on file  . Active Member of Clubs or Organizations: Not on file  . Attends Banker Meetings: Not on file  . Marital Status: Not on file     Family History: The patient's family history includes Hyperlipidemia in her father and mother; Hypertension in her father and mother. ROS:   Please see the history of present illness.    All 14 point review of systems negative except as described per history of present illness  EKGs/Labs/Other Studies Reviewed:      Recent Labs: No results found for requested labs within last 8760 hours.  Recent Lipid Panel    Component Value Date/Time   CHOL 169 09/18/2013 0614   TRIG 134 09/18/2013 0614   HDL 76 09/18/2013 0614   CHOLHDL 2.2 09/18/2013 0614   VLDL 27 09/18/2013 0614   LDLCALC 66 09/18/2013 0614    Physical Exam:    VS:  BP (!) 170/92 (BP Location: Left Arm, Patient Position: Sitting, Cuff Size: Normal)   Pulse 90   Ht 5\' 1"  (1.549 m)   Wt 151 lb (68.5 kg)   SpO2 96%   BMI 28.53 kg/m     Wt Readings from Last 3 Encounters:  05/08/20 151 lb (68.5 kg)  04/08/20 151 lb 12.8 oz (68.9 kg)  03/31/20 153 lb 10.6 oz (69.7 kg)     GEN:  Well nourished, well developed in no acute distress HEENT: Normal NECK: No JVD; No carotid bruits LYMPHATICS: No lymphadenopathy CARDIAC: RRR, no murmurs, no rubs, no gallops RESPIRATORY:  Clear to auscultation without rales, wheezing or rhonchi  ABDOMEN: Soft, non-tender, non-distended MUSCULOSKELETAL:  No edema; No deformity   SKIN: Warm and dry LOWER EXTREMITIES: no swelling NEUROLOGIC:  Alert and oriented x 3 PSYCHIATRIC:  Normal affect   ASSESSMENT:    1. Sinus tachycardia   2. Prediabetes   3. Chronic diastolic CHF (congestive heart failure) (HCC)   4. Essential hypertension   5. COPD, group D, by GOLD 2017 classification (HCC)    PLAN:    In order of problems listed above:  1. Sinus tachycardia.  I will  check her TSH today, proBNP will be done as well.  Her echocardiogram showed preserved left ventricle ejection fraction, concern was about potentially tachycardia induced cardiomyopathy, however, typical sinus tachycardia does not cause cardiomyopathy.  And sure enough her echocardiogram did not show that. 2. Prediabetes: That being managed by primary care physician. 3. Chronic diastolic congestive heart failure.  I will check a proBNP.  Based on transmitral flow look like her pulmonary artery wedge pressure is normal.  She did have relaxation normality on transmitral flow pattern.  She is on diuretic which I will continue but again we will check proBNP today. 4. Essential hypertension blood pressure elevated today but she said when she check it at home it is good it is usually 130/80.  We need to keep it below that especially for suspecting diastolic congestive heart failure. 5. COPD: To be managed excellently by primary care physician. 6. Dyslipidemia: Her calculated 10 years risk is 24%.  We will start Lipitor 10 mg daily we will follow-up with fasting lipid profile within the next 6 weeks   Medication Adjustments/Labs and Tests Ordered: Current medicines are reviewed at length with the patient today.  Concerns regarding medicines are outlined above.  No orders of the defined types were placed in this encounter.  Medication changes: No orders of the defined types were placed in this encounter.   Signed, Georgeanna Lea, MD, [facility] 05/08/2020 9:55 AM    [facility]

## 2020-05-09 LAB — PRO B NATRIURETIC PEPTIDE: NT-Pro BNP: 151 pg/mL (ref 0–301)

## 2020-05-09 LAB — TSH: TSH: 1.84 u[IU]/mL (ref 0.450–4.500)

## 2020-05-09 LAB — AST: AST: 15 IU/L (ref 0–40)

## 2020-05-09 LAB — ALT: ALT: 8 IU/L (ref 0–32)

## 2020-05-25 NOTE — Addendum Note (Signed)
Encounter addended by: Chelsea Aus, RN on: 05/25/2020 1:36 PM  Actions taken: Episode resolved, Flowsheet data copied forward, Flowsheet accepted

## 2020-06-16 ENCOUNTER — Other Ambulatory Visit: Payer: Self-pay | Admitting: Family Medicine

## 2020-06-16 DIAGNOSIS — M810 Age-related osteoporosis without current pathological fracture: Secondary | ICD-10-CM

## 2020-06-16 DIAGNOSIS — E2839 Other primary ovarian failure: Secondary | ICD-10-CM

## 2020-06-16 DIAGNOSIS — Z1231 Encounter for screening mammogram for malignant neoplasm of breast: Secondary | ICD-10-CM

## 2020-06-28 NOTE — Progress Notes (Signed)
Discharge Progress Report  Patient Details  Name: Katie Waller MRN: [id number] Date of Birth: 02-Dec-1945 Referring Provider:     Pulmonary Rehab Walk Test from 02/05/2020 in [place]  Referring Provider Dr. Marshell Garfinkel MD       Number of Visits: 14  Reason for Discharge:  Patient reached a stable level of exercise. Patient independent in their exercise. Patient has met program and personal goals.  Smoking History:  Social History   Tobacco Use  Smoking Status Former Smoker   Packs/day: 1.00   Years: 55.00   Pack years: 55.00   Types: Cigarettes   Start date: 07/23/2017   Quit date: 07/23/2017   Years since quitting: 2.9  Smokeless Tobacco Never Used    Diagnosis:  Chronic obstructive pulmonary disease, unspecified COPD type ([place])  ADL [facility]:  Pulmonary Assessment Scores    Row Name 02/05/20 1105 02/05/20 1241 04/09/20 1513     ADL [facility]   ADL Phase -- Entry Exit   SOB Score total -- 62 80     CAT Score   CAT Score -- 27 24     mMRC Score   mMRC Score 4 4 --          Initial Exercise Prescription:  Initial Exercise Prescription - 02/05/20 1100      Date of Initial Exercise RX and Referring Provider   Date 02/05/20    Referring Provider Dr. Marshell Garfinkel MD    Expected Discharge Date 04/03/20      Oxygen   Oxygen Continuous    Liters 3      NuStep   Level 2    SPM 75    Minutes 30    METs 1.5      Prescription Details   Frequency (times per week) 2x    Duration Progress to 10 minutes continuous walking  at current work load and total walking time to 30-45 min      Intensity   THRR 40-80% of Max Heartrate 60-119    Ratings of Perceived Exertion 11-13    Perceived Dyspnea 0-4      Progression   Progression Continue progressive overload as per policy without signs/symptoms or physical distress.      Resistance Training   Training Prescription Yes    Weight Orange Band    Reps 10-15            Discharge Exercise Prescription (Final Exercise Prescription Changes):  Exercise Prescription Changes - 03/31/20 1100      Response to Exercise   Blood Pressure (Admit) 124/72    Blood Pressure (Exercise) 152/68    Blood Pressure (Exit) 140/70    Heart Rate (Admit) 114 bpm    Heart Rate (Exercise) 125 bpm    Heart Rate (Exit) 118 bpm    Oxygen Saturation (Admit) 95 %    Oxygen Saturation (Exercise) 100 %    Oxygen Saturation (Exit) 99 %    Rating of Perceived Exertion (Exercise) 11    Perceived Dyspnea (Exercise) 1    Duration Continue with 30 min of aerobic exercise without signs/symptoms of physical distress.    Intensity THRR unchanged      Progression   Progression Continue to progress workloads to maintain intensity without signs/symptoms of physical distress.      Resistance Training   Training Prescription Yes    Weight orange bands    Reps 10-15    Time 10 Minutes  Oxygen   Oxygen Continuous    Liters 3      NuStep   Level 3    SPM 80    Minutes 30    METs 1.8           Functional Capacity:  6 Minute Walk    Row Name 02/05/20 1058         6 Minute Walk   Distance 904 feet     Walk Time 6 minutes  Total Walk Time: 5 minutes, 18 seconds     # of Rest Breaks 1     MPH 1.7     METS 2.3     RPE 13     Perceived Dyspnea  1     VO2 Peak 8.1     Symptoms Yes (comment)     Comments Mild SOB, Took 1 42 second rest break halfway through walk test     Resting HR 99 bpm     Resting BP 130/80     Resting Oxygen Saturation  100 %     Exercise Oxygen Saturation  during 6 min walk 95 %     Max Ex. HR 124 bpm     Max Ex. BP 136/84     2 Minute Post BP 128/76            Psychological, QOL, Others - Outcomes: PHQ 2/9: Depression screen PHQ 2/9 02/05/2020  Decreased Interest 0  Down, Depressed, Hopeless 0  PHQ - 2 Score 0  Altered sleeping 0  Tired, decreased energy 0  Change in appetite 0  Feeling bad or failure about yourself  0  Trouble  concentrating 0  Moving slowly or fidgety/restless 0  Suicidal thoughts 0  PHQ-9 Score 0  Difficult doing work/chores Not difficult at all    Quality of Life:   Personal Goals: Goals established at orientation with interventions provided to work toward goal.  Personal Goals and Risk Factors at Admission - 02/05/20 1243      Core Components/Risk Factors/Patient Goals on Admission   Improve shortness of breath with ADL's Yes    Intervention Provide education, individualized exercise plan and daily activity instruction to help decrease symptoms of SOB with activities of daily living.    Expected Outcomes Short Term: Improve cardiorespiratory fitness to achieve a reduction of symptoms when performing ADLs;Long Term: Be able to perform more ADLs without symptoms or delay the onset of symptoms            Personal Goals Discharge:  Goals and Risk Factor Review    Row Name 02/05/20 1244 02/17/20 0932 03/16/20 1317 03/16/20 1324 05/25/20 1310     Core Components/Risk Factors/Patient Goals Review   Personal Goals Review Increase knowledge of respiratory medications and ability to use respiratory devices properly.;Improve shortness of breath with ADL's;Develop more efficient breathing techniques such as purse lipped breathing and diaphragmatic breathing and practicing self-pacing with activity. Increase knowledge of respiratory medications and ability to use respiratory devices properly.;Improve shortness of breath with ADL's;Develop more efficient breathing techniques such as purse lipped breathing and diaphragmatic breathing and practicing self-pacing with activity. Increase knowledge of respiratory medications and ability to use respiratory devices properly.;Improve shortness of breath with ADL's;Develop more efficient breathing techniques such as purse lipped breathing and diaphragmatic breathing and practicing self-pacing with activity. -- Increase knowledge of respiratory medications and  ability to use respiratory devices properly.;Improve shortness of breath with ADL's;Develop more efficient breathing techniques such as purse lipped breathing and diaphragmatic  breathing and practicing self-pacing with activity.   Review -- Katie Waller just started program, has attended 2 exercise sessions, too early to have met program goals. Katie Waller has increased her workload on the nustep to 2.3 mets, she seems to be enjoying the program and her attendance has been good. -- Pt completed 14 exercise sessions in pulmonary rehab.   Expected Outcomes -- See admission goals. To continue increasing her workloads as tolerated. See admission goals. Pt achieved her admission goals.  Pt able to exercise on the nustep at level 3 for 30 minutes and achieved 2.1 MET on 3LNC.  O2 saturation 98-100.   [place] Name 05/25/20 1324 06/28/20 1552           Core Components/Risk Factors/Patient Goals Review   Review Pt completed 14 exercise sessions in pulmonary rehab on 04/07/20.  Pt showed increase score of 80 on her post pulmonary rehab dyspnea scale with 62 pre.  This increase may be attributed to better understanding of the dyspnea scale and what each number means.  Pt with decrease in CAT score of 27 to 24 Pt completed post assessments but elected not to return for post walk test.             Exercise Goals and Review:  Exercise Goals    Row Name 02/05/20 1105 02/18/20 1500 03/17/20 0649         Exercise Goals   Increase Physical Activity Yes Yes Yes     Intervention Provide advice, education, support and counseling about physical activity/exercise needs.;Develop an individualized exercise prescription for aerobic and resistive training based on initial evaluation findings, risk stratification, comorbidities and participant's personal goals. Provide advice, education, support and counseling about physical activity/exercise needs.;Develop an individualized exercise prescription for aerobic and resistive training based on  initial evaluation findings, risk stratification, comorbidities and participant's personal goals. Provide advice, education, support and counseling about physical activity/exercise needs.;Develop an individualized exercise prescription for aerobic and resistive training based on initial evaluation findings, risk stratification, comorbidities and participant's personal goals.     Expected Outcomes Short Term: Attend rehab on a regular basis to increase amount of physical activity.;Long Term: Add in home exercise to make exercise part of routine and to increase amount of physical activity.;Long Term: Exercising regularly at least 3-5 days a week. Short Term: Attend rehab on a regular basis to increase amount of physical activity.;Long Term: Add in home exercise to make exercise part of routine and to increase amount of physical activity.;Long Term: Exercising regularly at least 3-5 days a week. Short Term: Attend rehab on a regular basis to increase amount of physical activity.;Long Term: Add in home exercise to make exercise part of routine and to increase amount of physical activity.;Long Term: Exercising regularly at least 3-5 days a week.     Increase Strength and Stamina Yes Yes Yes     Intervention Provide advice, education, support and counseling about physical activity/exercise needs.;Develop an individualized exercise prescription for aerobic and resistive training based on initial evaluation findings, risk stratification, comorbidities and participant's personal goals. Provide advice, education, support and counseling about physical activity/exercise needs.;Develop an individualized exercise prescription for aerobic and resistive training based on initial evaluation findings, risk stratification, comorbidities and participant's personal goals. Provide advice, education, support and counseling about physical activity/exercise needs.;Develop an individualized exercise prescription for aerobic and resistive  training based on initial evaluation findings, risk stratification, comorbidities and participant's personal goals.     Expected Outcomes Short Term: Perform resistance training exercises routinely  during rehab and add in resistance training at home;Short Term: Increase workloads from initial exercise prescription for resistance, speed, and METs.;Long Term: Improve cardiorespiratory fitness, muscular endurance and strength as measured by increased METs and functional capacity (6MWT) Short Term: Perform resistance training exercises routinely during rehab and add in resistance training at home;Short Term: Increase workloads from initial exercise prescription for resistance, speed, and METs.;Long Term: Improve cardiorespiratory fitness, muscular endurance and strength as measured by increased METs and functional capacity (6MWT) Short Term: Perform resistance training exercises routinely during rehab and add in resistance training at home;Short Term: Increase workloads from initial exercise prescription for resistance, speed, and METs.;Long Term: Improve cardiorespiratory fitness, muscular endurance and strength as measured by increased METs and functional capacity (6MWT)     Able to understand and use rate of perceived exertion (RPE) scale Yes Yes Yes     Intervention Provide education and explanation on how to use RPE scale Provide education and explanation on how to use RPE scale Provide education and explanation on how to use RPE scale     Expected Outcomes Short Term: Able to use RPE daily in rehab to express subjective intensity level;Long Term:  Able to use RPE to guide intensity level when exercising independently Short Term: Able to use RPE daily in rehab to express subjective intensity level;Long Term:  Able to use RPE to guide intensity level when exercising independently Short Term: Able to use RPE daily in rehab to express subjective intensity level;Long Term:  Able to use RPE to guide intensity level  when exercising independently     Able to understand and use Dyspnea scale Yes Yes Yes     Intervention Provide education and explanation on how to use Dyspnea scale Provide education and explanation on how to use Dyspnea scale Provide education and explanation on how to use Dyspnea scale     Expected Outcomes Short Term: Able to use Dyspnea scale daily in rehab to express subjective sense of shortness of breath during exertion;Long Term: Able to use Dyspnea scale to guide intensity level when exercising independently Short Term: Able to use Dyspnea scale daily in rehab to express subjective sense of shortness of breath during exertion;Long Term: Able to use Dyspnea scale to guide intensity level when exercising independently Short Term: Able to use Dyspnea scale daily in rehab to express subjective sense of shortness of breath during exertion;Long Term: Able to use Dyspnea scale to guide intensity level when exercising independently     Knowledge and understanding of Target Heart Rate Range (THRR) Yes Yes Yes     Intervention Provide education and explanation of THRR including how the numbers were predicted and where they are located for reference Provide education and explanation of THRR including how the numbers were predicted and where they are located for reference Provide education and explanation of THRR including how the numbers were predicted and where they are located for reference     Expected Outcomes Short Term: Able to state/look up THRR;Long Term: Able to use THRR to govern intensity when exercising independently;Short Term: Able to use daily as guideline for intensity in rehab Short Term: Able to state/look up THRR;Long Term: Able to use THRR to govern intensity when exercising independently;Short Term: Able to use daily as guideline for intensity in rehab Short Term: Able to state/look up THRR;Long Term: Able to use THRR to govern intensity when exercising independently;Short Term: Able to use  daily as guideline for intensity in rehab     Understanding  of Exercise Prescription Yes Yes Yes     Intervention Provide education, explanation, and written materials on patient's individual exercise prescription Provide education, explanation, and written materials on patient's individual exercise prescription Provide education, explanation, and written materials on patient's individual exercise prescription     Expected Outcomes Short Term: Able to explain program exercise prescription;Long Term: Able to explain home exercise prescription to exercise independently Short Term: Able to explain program exercise prescription;Long Term: Able to explain home exercise prescription to exercise independently Short Term: Able to explain program exercise prescription;Long Term: Able to explain home exercise prescription to exercise independently            Exercise Goals Re-Evaluation:  Exercise Goals Re-Evaluation    Row Name 02/18/20 1500 03/17/20 0649           Exercise Goal Re-Evaluation   Exercise Goals Review Increase Physical Activity;Increase Strength and Stamina;Able to understand and use rate of perceived exertion (RPE) scale;Able to understand and use Dyspnea scale;Knowledge and understanding of Target Heart Rate Range (THRR);Understanding of Exercise Prescription Increase Physical Activity;Increase Strength and Stamina;Able to understand and use rate of perceived exertion (RPE) scale;Able to understand and use Dyspnea scale;Knowledge and understanding of Target Heart Rate Range (THRR);Understanding of Exercise Prescription      Comments Pt has completed 3 exercise sessions. She has a positive attitude and wants to become more active. She is currently exercising at 2.0 METs on the stepper. Will continue to monitor and progress as able. Pt has completed 8 exercise sessions. She still remains motivated and is progressing her workloads. She is currently exercising at 2.3 METs on the stepper. Will  continue to monitor and progress as able.      Expected Outcomes Through exercise at rehab and at home, the patient will decrease shortness of breath with daily activities and feel confident in carrying out an exercise regime at home. Through exercise at rehab and at home, the patient will decrease shortness of breath with daily activities and feel confident in carrying out an exercise regime at home.             Nutrition & Weight - Outcomes:  Pre Biometrics - 02/05/20 0955      Pre Biometrics   Height 5' 1.5" (1.562 m)    Weight 68.8 kg    BMI (Calculated) 28.2    Grip Strength 18 kg            Nutrition:  Nutrition Therapy & Goals - 02/18/20 1110      Nutrition Therapy   Diet Low Sodium      Personal Nutrition Goals   Nutrition Goal Pt to limit high sodium foods by reading labels    Personal Goal #2 Pt to build a healthy plate including vegetables, fruits, whole grains, and low-fat dairy products in a generally healthy meal plan.      Intervention Plan   Intervention Prescribe, educate and counsel regarding individualized specific dietary modifications aiming towards targeted core components such as weight, hypertension, lipid management, diabetes, heart failure and other comorbidities.    Expected Outcomes Short Term Goal: Understand basic principles of dietary content, such as calories, fat, sodium, cholesterol and nutrients.           Nutrition Discharge:  Nutrition Assessments - 04/30/20 0703      Rate Your Plate Scores   Pre Score % 49 %           Education Questionnaire Score:  Knowledge Questionnaire Score - 04/09/20  1517      Knowledge Questionnaire Score   Post Score 17/18           Goals reviewed with patient Maurice Small RN, BSN Cardiac and Pulmonary Rehab Nurse Navigator

## 2020-06-28 NOTE — Addendum Note (Signed)
Encounter addended by: Chelsea Aus, RN on: 06/28/2020 4:03 PM  Actions taken: Flowsheet accepted, Clinical Note Signed

## 2020-09-05 DIAGNOSIS — J441 Chronic obstructive pulmonary disease with (acute) exacerbation: Secondary | ICD-10-CM | POA: Diagnosis not present

## 2020-09-05 DIAGNOSIS — J9600 Acute respiratory failure, unspecified whether with hypoxia or hypercapnia: Secondary | ICD-10-CM | POA: Diagnosis not present

## 2020-09-05 DIAGNOSIS — R0902 Hypoxemia: Secondary | ICD-10-CM | POA: Diagnosis not present

## 2020-09-05 DIAGNOSIS — J449 Chronic obstructive pulmonary disease, unspecified: Secondary | ICD-10-CM | POA: Diagnosis not present

## 2020-09-25 ENCOUNTER — Other Ambulatory Visit: Payer: Self-pay | Admitting: Gastroenterology

## 2020-09-25 DIAGNOSIS — R195 Other fecal abnormalities: Secondary | ICD-10-CM

## 2020-09-25 DIAGNOSIS — Z86010 Personal history of colonic polyps: Secondary | ICD-10-CM

## 2020-09-28 DIAGNOSIS — G4700 Insomnia, unspecified: Secondary | ICD-10-CM | POA: Diagnosis not present

## 2020-09-28 DIAGNOSIS — M810 Age-related osteoporosis without current pathological fracture: Secondary | ICD-10-CM | POA: Diagnosis not present

## 2020-09-28 DIAGNOSIS — J449 Chronic obstructive pulmonary disease, unspecified: Secondary | ICD-10-CM | POA: Diagnosis not present

## 2020-09-28 DIAGNOSIS — G459 Transient cerebral ischemic attack, unspecified: Secondary | ICD-10-CM | POA: Diagnosis not present

## 2020-09-28 DIAGNOSIS — I110 Hypertensive heart disease with heart failure: Secondary | ICD-10-CM | POA: Diagnosis not present

## 2020-09-28 DIAGNOSIS — J441 Chronic obstructive pulmonary disease with (acute) exacerbation: Secondary | ICD-10-CM | POA: Diagnosis not present

## 2020-09-28 DIAGNOSIS — I10 Essential (primary) hypertension: Secondary | ICD-10-CM | POA: Diagnosis not present

## 2020-09-28 DIAGNOSIS — I509 Heart failure, unspecified: Secondary | ICD-10-CM | POA: Diagnosis not present

## 2020-10-02 ENCOUNTER — Other Ambulatory Visit: Payer: Medicare Other

## 2020-10-02 ENCOUNTER — Ambulatory Visit: Payer: Medicare Other

## 2020-10-06 DIAGNOSIS — J9600 Acute respiratory failure, unspecified whether with hypoxia or hypercapnia: Secondary | ICD-10-CM | POA: Diagnosis not present

## 2020-10-06 DIAGNOSIS — J441 Chronic obstructive pulmonary disease with (acute) exacerbation: Secondary | ICD-10-CM | POA: Diagnosis not present

## 2020-10-06 DIAGNOSIS — R0902 Hypoxemia: Secondary | ICD-10-CM | POA: Diagnosis not present

## 2020-10-06 DIAGNOSIS — J449 Chronic obstructive pulmonary disease, unspecified: Secondary | ICD-10-CM | POA: Diagnosis not present

## 2020-10-14 ENCOUNTER — Ambulatory Visit: Payer: Medicare Other | Admitting: Cardiology

## 2020-10-22 ENCOUNTER — Telehealth: Payer: Self-pay | Admitting: Pulmonary Disease

## 2020-10-22 DIAGNOSIS — M810 Age-related osteoporosis without current pathological fracture: Secondary | ICD-10-CM | POA: Diagnosis not present

## 2020-10-22 DIAGNOSIS — D649 Anemia, unspecified: Secondary | ICD-10-CM | POA: Diagnosis not present

## 2020-10-22 DIAGNOSIS — R609 Edema, unspecified: Secondary | ICD-10-CM | POA: Diagnosis not present

## 2020-10-22 DIAGNOSIS — G4700 Insomnia, unspecified: Secondary | ICD-10-CM | POA: Diagnosis not present

## 2020-10-22 DIAGNOSIS — I509 Heart failure, unspecified: Secondary | ICD-10-CM | POA: Diagnosis not present

## 2020-10-22 DIAGNOSIS — I700 Atherosclerosis of aorta: Secondary | ICD-10-CM | POA: Diagnosis not present

## 2020-10-22 DIAGNOSIS — R202 Paresthesia of skin: Secondary | ICD-10-CM | POA: Diagnosis not present

## 2020-10-22 DIAGNOSIS — J449 Chronic obstructive pulmonary disease, unspecified: Secondary | ICD-10-CM | POA: Diagnosis not present

## 2020-10-22 DIAGNOSIS — E7800 Pure hypercholesterolemia, unspecified: Secondary | ICD-10-CM | POA: Diagnosis not present

## 2020-10-22 DIAGNOSIS — Z23 Encounter for immunization: Secondary | ICD-10-CM | POA: Diagnosis not present

## 2020-10-22 DIAGNOSIS — I10 Essential (primary) hypertension: Secondary | ICD-10-CM | POA: Diagnosis not present

## 2020-10-22 NOTE — Telephone Encounter (Signed)
I had to leave a message for Katie Waller to return my call to schedule Katie Waller's L[facility] CT [phone] °

## 2020-10-26 NOTE — Telephone Encounter (Signed)
I had to leave a message for Katie Waller to return my call to schedule Mrs. Kirkwood's LCS CT [phone]

## 2020-10-27 ENCOUNTER — Ambulatory Visit
Admission: RE | Admit: 2020-10-27 | Discharge: 2020-10-27 | Disposition: A | Discharge status: Home / Self Care | Payer: Medicare Other | Source: Ambulatory Visit | Attending: Gastroenterology | Admitting: Gastroenterology

## 2020-10-27 ENCOUNTER — Other Ambulatory Visit: Payer: Self-pay

## 2020-10-27 DIAGNOSIS — K4020 Bilateral inguinal hernia, without obstruction or gangrene, not specified as recurrent: Secondary | ICD-10-CM | POA: Diagnosis not present

## 2020-10-27 DIAGNOSIS — R195 Other fecal abnormalities: Secondary | ICD-10-CM

## 2020-10-27 DIAGNOSIS — Z86010 Personal history of colonic polyps: Secondary | ICD-10-CM

## 2020-10-27 DIAGNOSIS — Z0389 Encounter for observation for other suspected diseases and conditions ruled out: Secondary | ICD-10-CM | POA: Diagnosis not present

## 2020-10-27 NOTE — Telephone Encounter (Signed)
I have spoke with Derryl Harbor and we now have Mrs. Panos's LCS CT scheduled for 11/03/2020 @ 3:30pm

## 2020-11-03 ENCOUNTER — Ambulatory Visit (INDEPENDENT_AMBULATORY_CARE_PROVIDER_SITE_OTHER)
Admission: RE | Admit: 2020-11-03 | Discharge: 2020-11-03 | Disposition: A | Discharge status: Home / Self Care | Payer: Medicare Other | Source: Ambulatory Visit | Attending: Family Medicine | Admitting: Family Medicine

## 2020-11-03 ENCOUNTER — Other Ambulatory Visit: Payer: Self-pay

## 2020-11-03 DIAGNOSIS — Z87891 Personal history of nicotine dependence: Secondary | ICD-10-CM | POA: Diagnosis not present

## 2020-11-03 DIAGNOSIS — J441 Chronic obstructive pulmonary disease with (acute) exacerbation: Secondary | ICD-10-CM | POA: Diagnosis not present

## 2020-11-03 DIAGNOSIS — R0902 Hypoxemia: Secondary | ICD-10-CM | POA: Diagnosis not present

## 2020-11-03 DIAGNOSIS — J449 Chronic obstructive pulmonary disease, unspecified: Secondary | ICD-10-CM | POA: Diagnosis not present

## 2020-11-03 DIAGNOSIS — J9600 Acute respiratory failure, unspecified whether with hypoxia or hypercapnia: Secondary | ICD-10-CM | POA: Diagnosis not present

## 2020-11-09 ENCOUNTER — Other Ambulatory Visit: Payer: Self-pay | Admitting: Cardiology

## 2020-11-09 ENCOUNTER — Other Ambulatory Visit: Payer: Self-pay

## 2020-11-09 DIAGNOSIS — I7300 Raynaud's syndrome without gangrene: Secondary | ICD-10-CM | POA: Insufficient documentation

## 2020-11-09 DIAGNOSIS — D509 Iron deficiency anemia, unspecified: Secondary | ICD-10-CM | POA: Insufficient documentation

## 2020-11-09 DIAGNOSIS — R160 Hepatomegaly, not elsewhere classified: Secondary | ICD-10-CM | POA: Insufficient documentation

## 2020-11-09 DIAGNOSIS — I700 Atherosclerosis of aorta: Secondary | ICD-10-CM

## 2020-11-09 DIAGNOSIS — E7800 Pure hypercholesterolemia, unspecified: Secondary | ICD-10-CM | POA: Insufficient documentation

## 2020-11-09 DIAGNOSIS — G4700 Insomnia, unspecified: Secondary | ICD-10-CM

## 2020-11-09 DIAGNOSIS — E2839 Other primary ovarian failure: Secondary | ICD-10-CM | POA: Insufficient documentation

## 2020-11-09 DIAGNOSIS — F17201 Nicotine dependence, unspecified, in remission: Secondary | ICD-10-CM

## 2020-11-09 DIAGNOSIS — R609 Edema, unspecified: Secondary | ICD-10-CM

## 2020-11-09 DIAGNOSIS — R0989 Other specified symptoms and signs involving the circulatory and respiratory systems: Secondary | ICD-10-CM | POA: Insufficient documentation

## 2020-11-09 DIAGNOSIS — Z860100 Personal history of colon polyps, unspecified: Secondary | ICD-10-CM | POA: Insufficient documentation

## 2020-11-09 DIAGNOSIS — F419 Anxiety disorder, unspecified: Secondary | ICD-10-CM | POA: Insufficient documentation

## 2020-11-09 DIAGNOSIS — M810 Age-related osteoporosis without current pathological fracture: Secondary | ICD-10-CM | POA: Insufficient documentation

## 2020-11-09 DIAGNOSIS — J45909 Unspecified asthma, uncomplicated: Secondary | ICD-10-CM | POA: Insufficient documentation

## 2020-11-09 DIAGNOSIS — J449 Chronic obstructive pulmonary disease, unspecified: Secondary | ICD-10-CM

## 2020-11-09 DIAGNOSIS — E119 Type 2 diabetes mellitus without complications: Secondary | ICD-10-CM | POA: Insufficient documentation

## 2020-11-09 DIAGNOSIS — G459 Transient cerebral ischemic attack, unspecified: Secondary | ICD-10-CM | POA: Insufficient documentation

## 2020-11-09 DIAGNOSIS — R209 Unspecified disturbances of skin sensation: Secondary | ICD-10-CM | POA: Insufficient documentation

## 2020-11-09 DIAGNOSIS — R0902 Hypoxemia: Secondary | ICD-10-CM | POA: Insufficient documentation

## 2020-11-09 DIAGNOSIS — R918 Other nonspecific abnormal finding of lung field: Secondary | ICD-10-CM | POA: Insufficient documentation

## 2020-11-09 DIAGNOSIS — I519 Heart disease, unspecified: Secondary | ICD-10-CM | POA: Insufficient documentation

## 2020-11-09 DIAGNOSIS — R059 Cough, unspecified: Secondary | ICD-10-CM | POA: Insufficient documentation

## 2020-11-09 DIAGNOSIS — I10 Essential (primary) hypertension: Secondary | ICD-10-CM | POA: Insufficient documentation

## 2020-11-09 DIAGNOSIS — I110 Hypertensive heart disease with heart failure: Secondary | ICD-10-CM | POA: Insufficient documentation

## 2020-11-09 HISTORY — DX: Cough, unspecified: R05.9

## 2020-11-09 HISTORY — DX: Transient cerebral ischemic attack, unspecified: G45.9

## 2020-11-09 HISTORY — DX: Chronic obstructive pulmonary disease, unspecified: J44.9

## 2020-11-09 HISTORY — DX: Anxiety disorder, unspecified: F41.9

## 2020-11-09 HISTORY — DX: Raynaud's syndrome without gangrene: I73.00

## 2020-11-09 HISTORY — DX: Other primary ovarian failure: E28.39

## 2020-11-09 HISTORY — DX: Edema, unspecified: R60.9

## 2020-11-09 HISTORY — DX: Atherosclerosis of aorta: I70.0

## 2020-11-09 HISTORY — DX: Unspecified disturbances of skin sensation: R20.9

## 2020-11-09 HISTORY — DX: Pure hypercholesterolemia, unspecified: E78.00

## 2020-11-09 HISTORY — DX: Iron deficiency anemia, unspecified: D50.9

## 2020-11-09 HISTORY — DX: Nicotine dependence, unspecified, in remission: F17.201

## 2020-11-09 HISTORY — DX: Other specified symptoms and signs involving the circulatory and respiratory systems: R09.89

## 2020-11-09 HISTORY — DX: Insomnia, unspecified: G47.00

## 2020-11-09 HISTORY — DX: Age-related osteoporosis without current pathological fracture: M81.0

## 2020-11-09 HISTORY — DX: Hypertensive heart disease with heart failure: I11.0

## 2020-11-09 HISTORY — DX: Hepatomegaly, not elsewhere classified: R16.0

## 2020-11-09 HISTORY — DX: Hypoxemia: R09.02

## 2020-11-10 NOTE — Progress Notes (Signed)
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 I told her we will call her closer to the time to get her scheduled. She verbalized understanding and had no further questions at completion of the call.  Angelique Blonder, please send results to  PCP and place order for 6 month follow up.  Thanks so much

## 2020-11-12 ENCOUNTER — Other Ambulatory Visit: Payer: Self-pay | Admitting: *Deleted

## 2020-11-12 ENCOUNTER — Other Ambulatory Visit: Payer: Self-pay

## 2020-11-12 ENCOUNTER — Encounter: Payer: Self-pay | Admitting: Cardiology

## 2020-11-12 ENCOUNTER — Ambulatory Visit (INDEPENDENT_AMBULATORY_CARE_PROVIDER_SITE_OTHER): Payer: Medicare Other | Admitting: Cardiology

## 2020-11-12 VITALS — BP 122/74 | HR 105 | Ht 61.0 in | Wt 157.0 lb

## 2020-11-12 DIAGNOSIS — F17201 Nicotine dependence, unspecified, in remission: Secondary | ICD-10-CM

## 2020-11-12 DIAGNOSIS — I110 Hypertensive heart disease with heart failure: Secondary | ICD-10-CM | POA: Diagnosis not present

## 2020-11-12 DIAGNOSIS — I5032 Chronic diastolic (congestive) heart failure: Secondary | ICD-10-CM

## 2020-11-12 DIAGNOSIS — R0989 Other specified symptoms and signs involving the circulatory and respiratory systems: Secondary | ICD-10-CM | POA: Diagnosis not present

## 2020-11-12 DIAGNOSIS — R000 Tachycardia, unspecified: Secondary | ICD-10-CM | POA: Diagnosis not present

## 2020-11-12 DIAGNOSIS — Z87891 Personal history of nicotine dependence: Secondary | ICD-10-CM

## 2020-11-12 NOTE — Progress Notes (Signed)
Cardiology Office Note:    Date:  11/12/2020   ID:  Katie Waller, DOB 28-Jan-1946, MRN [id number]  PCP:  Elias Else, MD  Cardiologist:  Gypsy Balsam, MD    Referring MD: Elias Else, MD   Chief Complaint  Patient presents with  . Follow-up  Very worried  History of Present Illness:    Katie Waller is a 75 y.o. female with past medical history significant for COPD, diastolic congestive heart failure, peripheral vascular disease in form of carotic bruit and carotic arterial stenosis but not critical based on last notes from 2020, chronic respiratory failure with high oxygen.  Initially she was referred to [place] because of sinus tachycardia all work-up for this being unrevealing, TSH normal proBNP normal echocardiogram showed preserved ejection fraction with diastolic dysfunction type I.  Overall have no good explanation for tachycardia. She comes today to my office to talk about her issues.  She is terrified about the fact that she was told only 50% of her kidneys functioning, she also was told to have some lesion on the CT of her chest that she is concerned about it.  She is also concerned about the fact that she is anemic.  We did talk some about that and I told her all distance correctable of course CT of her chest need to be followed within the next 6 months.  Denies have any cardiac complaints no chest pain tightness squeezing pressure burning chest, she is spend her time sitting in the chair and watching action movies  Past Medical History:  Diagnosis Date  . Abnormal finding on EKG 09/19/2013  . Acute on chronic respiratory failure with hypercapnia (HCC)   . Acute respiratory failure (HCC) 06/08/2016  . Anxiety 11/09/2020  . Asthma   . Carotid bruit 11/09/2020  . Chronic diastolic CHF (congestive heart failure) (HCC) 07/28/2017  . Chronic obstructive pulmonary disease, unspecified (HCC) 11/09/2020  . COPD, group D, by GOLD 2017 classification (HCC) 09/17/2013  . Cough  11/09/2020  . Decreased estrogen level 11/09/2020  . Diabetes (HCC)   . Edema 11/09/2020  . Essential hypertension 09/18/2013  . Goals of care, counseling/discussion   . Hardening of the aorta (main artery of the heart) (HCC) 11/09/2020  . Heart disease   . Hypertension   . Hypertensive heart failure (HCC) 11/09/2020  . Hypoxia 11/09/2020  . Insomnia 11/09/2020  . Iron deficiency anemia 11/09/2020  . Large liver 11/09/2020  . Near syncope 09/17/2013  . Osteoporosis 11/09/2020  . Palliative care encounter   . Prediabetes 09/19/2013  . Pure hypercholesterolemia 11/09/2020  . Raynaud's disease 11/09/2020  . Sinus tachycardia 09/18/2013  . Skin sensation disturbance 11/09/2020  . Smoker 09/17/2013  . 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  . Transient ischemic attack 11/09/2020  . Venous stasis of both lower extremities 06/13/2016    Past Surgical History:  Procedure Laterality Date  . KNEE SURGERY Left     Current Medications: Current Meds  Medication Sig  . albuterol (PROVENTIL HFA;VENTOLIN HFA) 108 (90 BASE) MCG/ACT inhaler Inhale 2 puffs into the lungs every 6 (six) hours as needed for wheezing or shortness of breath.  Marland Kitchen amLODipine (NORVASC) 5 MG tablet Take 5 mg by mouth every morning.  Marland Kitchen aspirin 81 MG chewable tablet Chew 1 tablet (81 mg total) by mouth daily.  Marland Kitchen atorvastatin (LIPITOR) 10 MG tablet TAKE 1 TABLET BY MOUTH EVERY DAY  . benzonatate (TESSALON) 100 MG capsule Take 100 mg by mouth 3 (three) times daily as  needed for cough.   . budesonide-formoterol (SYMBICORT) 160-4.5 MCG/ACT inhaler Inhale 2 puffs into the lungs 2 (two) times daily.  . Calcium 600-200 MG-UNIT tablet Take 1 tablet by mouth daily.  . furosemide (LASIX) 40 MG tablet Take 40 mg by mouth daily as needed for fluid or edema.   . gabapentin (NEURONTIN) 300 MG capsule Take 600 mg by mouth at bedtime.  . iron polysaccharides (NIFEREX) 150 MG capsule Take 150 mg by mouth daily.  Marland Kitchen LORazepam (ATIVAN) 1 MG tablet Take 1 tablet (1 mg  total) by mouth every 6 (six) hours as needed for anxiety.  Marland Kitchen losartan (COZAAR) 50 MG tablet Take 50 mg by mouth daily.  . Multiple Vitamin (MULTIVITAMIN) tablet Take 1 tablet by mouth daily.  . raloxifene (EVISTA) 60 MG tablet Take 60 mg by mouth every morning.  . tiotropium (SPIRIVA) 18 MCG inhalation capsule Place 18 mcg into inhaler and inhale daily.  . traZODone (DESYREL) 100 MG tablet Take 1 tablet (100 mg total) by mouth at bedtime as needed for sleep.     Allergies:   Ace inhibitors, Diphenhydramine, Doxycycline hyclate, and Codeine   Social History   Socioeconomic History  . Marital status: Divorced    Spouse name: Not on file  . Number of children: Not on file  . Years of education: Not on file  . Highest education level: Not on file  Occupational History  . Not on file  Tobacco Use  . Smoking status: Former Smoker    Packs/day: 1.00    Years: 55.00    Pack years: 55.00    Types: Cigarettes    Start date: 07/23/2017    Quit date: 07/23/2017    Years since quitting: 3.3  . Smokeless tobacco: Never Used  Vaping Use  . Vaping Use: Former  Substance and Sexual Activity  . Alcohol use: No  . Drug use: No  . Sexual activity: Never  Other Topics Concern  . Not on file  Social History Narrative  . Not on file   Social Determinants of Health   Financial Resource Strain: Not on file  Food Insecurity: Not on file  Transportation Needs: Not on file  Physical Activity: Not on file  Stress: Not on file  Social Connections: Not on file     Family History: The patient's family history includes Hyperlipidemia in her father and mother; Hypertension in her father and mother. ROS:   Please see the history of present illness.    All 14 point review of systems negative except as described per history of present illness  EKGs/Labs/Other Studies Reviewed:      Recent Labs: 05/08/2020: ALT 8; NT-Pro BNP 151; TSH 1.840  Recent Lipid Panel    Component Value Date/Time    CHOL 169 09/18/2013 0614   TRIG 134 09/18/2013 0614   HDL 76 09/18/2013 0614   CHOLHDL 2.2 09/18/2013 0614   VLDL 27 09/18/2013 0614   LDLCALC 66 09/18/2013 0614    Physical Exam:    VS:  BP 122/74 (BP Location: Right Arm, Patient Position: Sitting)   Pulse (!) 105   Ht 5\' 1"  (1.549 m)   Wt 157 lb (71.2 kg)   SpO2 (!) 89%   BMI 29.66 kg/m     Wt Readings from Last 3 Encounters:  11/12/20 157 lb (71.2 kg)  05/08/20 151 lb (68.5 kg)  04/08/20 151 lb 12.8 oz (68.9 kg)     GEN:  Well nourished, well developed in no acute  distress HEENT: Normal NECK: No JVD; No carotid bruits LYMPHATICS: No lymphadenopathy CARDIAC: RRR, no murmurs, no rubs, no gallops RESPIRATORY:  Clear to auscultation without rales, wheezing or rhonchi  ABDOMEN: Soft, non-tender, non-distended MUSCULOSKELETAL:  No edema; No deformity  SKIN: Warm and dry LOWER EXTREMITIES: no swelling NEUROLOGIC:  Alert and oriented x 3 PSYCHIATRIC:  Normal affect   ASSESSMENT:    1. Hypertensive heart failure (HCC)   2. Chronic diastolic CHF (congestive heart failure) (HCC)   3. Bilateral carotid bruits   4. Sinus tachycardia   5. Tobacco dependence in remission    PLAN:    In order of problems listed above:  1. Essential hypertension blood pressure well controlled we will continue present management. 2. Dyslipidemia she is taking moderate intensity statin in form of Lipitor 10 which I will continue her last KAF cholesterol is LDL 90 HDL 83 this is from 10/22/2020 is from K PN.  We will recheck carotic ultrasounds and based on that we will decide about how aggressive want to be with her therapy. 3. Peripheral vascular disease in form of carotic arterial disease time to repeat her carotid ultrasounds which we will do.  Last ultrasound reviewed which was done from 2020. 4. History of tobacco use she is abstain from smoking and I encouraged her to stay away from it.   Medication Adjustments/Labs and Tests  Ordered: Current medicines are reviewed at length with the patient today.  Concerns regarding medicines are outlined above.  No orders of the defined types were placed in this encounter.  Medication changes: No orders of the defined types were placed in this encounter.   Signed, Georgeanna Lea, MD, [facility] 11/12/2020 1:56 PM    [facility]

## 2020-11-12 NOTE — Patient Instructions (Signed)
Medication Instructions:  Your physician recommends that you continue on your current medications as directed. Please refer to the Current Medication list given to you today.  *If you need a refill on your cardiac medications before your next appointment, please call your pharmacy*   Lab Work: None If you have labs (blood work) drawn today and your tests are completely normal, you will receive your results only by: MyChart Message (if you have MyChart) OR A paper copy in the mail If you have any lab test that is abnormal or we need to change your treatment, we will call you to review the results.   Testing/Procedures: Your physician has requested that you have a carotid duplex. This test is an ultrasound of the carotid arteries in your neck. It looks at blood flow through these arteries that supply the brain with blood. Allow one hour for this exam. There are no restrictions or special instructions.    Follow-Up: At [facility], you and your health needs are our priority.  As part of our continuing mission to provide you with exceptional heart care, we have created designated Provider Care Teams.  These Care Teams include your primary Cardiologist (physician) and Advanced Practice Providers (APPs -  Physician Assistants and Nurse Practitioners) who all work together to provide you with the care you need, when you need it.  We recommend signing up for the patient portal called "MyChart".  Sign up information is provided on this After Visit Summary.  MyChart is used to connect with patients for Virtual Visits (Telemedicine).  Patients are able to view lab/test results, encounter notes, upcoming appointments, etc.  Non-urgent messages can be sent to your provider as well.   To learn more about what you can do with MyChart, go to [email or web address].    Your next appointment:   1 year(s)  The format for your next appointment:   In Person  Provider:   Robert Krasowski, MD    Other  Instructions   

## 2020-11-12 NOTE — Addendum Note (Signed)
Addended by: Hazle Quant on: 11/12/2020 02:04 PM   Modules accepted: Orders

## 2020-11-16 DIAGNOSIS — I110 Hypertensive heart disease with heart failure: Secondary | ICD-10-CM | POA: Diagnosis not present

## 2020-11-16 DIAGNOSIS — G4700 Insomnia, unspecified: Secondary | ICD-10-CM | POA: Diagnosis not present

## 2020-11-16 DIAGNOSIS — D509 Iron deficiency anemia, unspecified: Secondary | ICD-10-CM | POA: Diagnosis not present

## 2020-11-16 DIAGNOSIS — I10 Essential (primary) hypertension: Secondary | ICD-10-CM | POA: Diagnosis not present

## 2020-11-16 DIAGNOSIS — M810 Age-related osteoporosis without current pathological fracture: Secondary | ICD-10-CM | POA: Diagnosis not present

## 2020-11-16 DIAGNOSIS — G459 Transient cerebral ischemic attack, unspecified: Secondary | ICD-10-CM | POA: Diagnosis not present

## 2020-11-16 DIAGNOSIS — I509 Heart failure, unspecified: Secondary | ICD-10-CM | POA: Diagnosis not present

## 2020-11-16 DIAGNOSIS — J449 Chronic obstructive pulmonary disease, unspecified: Secondary | ICD-10-CM | POA: Diagnosis not present

## 2020-11-16 DIAGNOSIS — E7800 Pure hypercholesterolemia, unspecified: Secondary | ICD-10-CM | POA: Diagnosis not present

## 2020-12-03 DIAGNOSIS — E559 Vitamin D deficiency, unspecified: Secondary | ICD-10-CM | POA: Diagnosis not present

## 2020-12-03 DIAGNOSIS — D509 Iron deficiency anemia, unspecified: Secondary | ICD-10-CM | POA: Diagnosis not present

## 2020-12-03 DIAGNOSIS — N289 Disorder of kidney and ureter, unspecified: Secondary | ICD-10-CM | POA: Diagnosis not present

## 2020-12-04 DIAGNOSIS — J9600 Acute respiratory failure, unspecified whether with hypoxia or hypercapnia: Secondary | ICD-10-CM | POA: Diagnosis not present

## 2020-12-04 DIAGNOSIS — J449 Chronic obstructive pulmonary disease, unspecified: Secondary | ICD-10-CM | POA: Diagnosis not present

## 2020-12-04 DIAGNOSIS — R0902 Hypoxemia: Secondary | ICD-10-CM | POA: Diagnosis not present

## 2020-12-21 ENCOUNTER — Other Ambulatory Visit: Payer: Self-pay

## 2020-12-21 ENCOUNTER — Ambulatory Visit (HOSPITAL_BASED_OUTPATIENT_CLINIC_OR_DEPARTMENT_OTHER)
Admission: RE | Admit: 2020-12-21 | Discharge: 2020-12-21 | Disposition: A | Discharge status: Home / Self Care | Payer: Medicare Other | Source: Ambulatory Visit | Attending: Cardiology | Admitting: Cardiology

## 2020-12-21 DIAGNOSIS — R0989 Other specified symptoms and signs involving the circulatory and respiratory systems: Secondary | ICD-10-CM | POA: Diagnosis not present

## 2020-12-21 DIAGNOSIS — M810 Age-related osteoporosis without current pathological fracture: Secondary | ICD-10-CM | POA: Diagnosis not present

## 2020-12-21 DIAGNOSIS — D509 Iron deficiency anemia, unspecified: Secondary | ICD-10-CM | POA: Diagnosis not present

## 2020-12-21 DIAGNOSIS — G4700 Insomnia, unspecified: Secondary | ICD-10-CM | POA: Diagnosis not present

## 2020-12-21 DIAGNOSIS — E7800 Pure hypercholesterolemia, unspecified: Secondary | ICD-10-CM | POA: Diagnosis not present

## 2020-12-21 DIAGNOSIS — G459 Transient cerebral ischemic attack, unspecified: Secondary | ICD-10-CM | POA: Diagnosis not present

## 2020-12-21 DIAGNOSIS — I110 Hypertensive heart disease with heart failure: Secondary | ICD-10-CM | POA: Diagnosis not present

## 2020-12-21 DIAGNOSIS — I509 Heart failure, unspecified: Secondary | ICD-10-CM | POA: Diagnosis not present

## 2020-12-21 DIAGNOSIS — J449 Chronic obstructive pulmonary disease, unspecified: Secondary | ICD-10-CM | POA: Diagnosis not present

## 2020-12-21 DIAGNOSIS — I10 Essential (primary) hypertension: Secondary | ICD-10-CM | POA: Diagnosis not present

## 2021-01-03 DIAGNOSIS — J449 Chronic obstructive pulmonary disease, unspecified: Secondary | ICD-10-CM | POA: Diagnosis not present

## 2021-01-03 DIAGNOSIS — J9600 Acute respiratory failure, unspecified whether with hypoxia or hypercapnia: Secondary | ICD-10-CM | POA: Diagnosis not present

## 2021-01-03 DIAGNOSIS — R0902 Hypoxemia: Secondary | ICD-10-CM | POA: Diagnosis not present

## 2021-01-05 DIAGNOSIS — D509 Iron deficiency anemia, unspecified: Secondary | ICD-10-CM | POA: Diagnosis not present

## 2021-02-03 DIAGNOSIS — J449 Chronic obstructive pulmonary disease, unspecified: Secondary | ICD-10-CM | POA: Diagnosis not present

## 2021-02-03 DIAGNOSIS — J9600 Acute respiratory failure, unspecified whether with hypoxia or hypercapnia: Secondary | ICD-10-CM | POA: Diagnosis not present

## 2021-02-03 DIAGNOSIS — R0902 Hypoxemia: Secondary | ICD-10-CM | POA: Diagnosis not present

## 2021-02-09 DIAGNOSIS — D509 Iron deficiency anemia, unspecified: Secondary | ICD-10-CM | POA: Diagnosis not present

## 2021-02-09 DIAGNOSIS — E7800 Pure hypercholesterolemia, unspecified: Secondary | ICD-10-CM | POA: Diagnosis not present

## 2021-02-09 DIAGNOSIS — J449 Chronic obstructive pulmonary disease, unspecified: Secondary | ICD-10-CM | POA: Diagnosis not present

## 2021-02-09 DIAGNOSIS — I110 Hypertensive heart disease with heart failure: Secondary | ICD-10-CM | POA: Diagnosis not present

## 2021-02-09 DIAGNOSIS — G4700 Insomnia, unspecified: Secondary | ICD-10-CM | POA: Diagnosis not present

## 2021-02-09 DIAGNOSIS — I509 Heart failure, unspecified: Secondary | ICD-10-CM | POA: Diagnosis not present

## 2021-02-09 DIAGNOSIS — G459 Transient cerebral ischemic attack, unspecified: Secondary | ICD-10-CM | POA: Diagnosis not present

## 2021-02-09 DIAGNOSIS — M810 Age-related osteoporosis without current pathological fracture: Secondary | ICD-10-CM | POA: Diagnosis not present

## 2021-02-09 DIAGNOSIS — I10 Essential (primary) hypertension: Secondary | ICD-10-CM | POA: Diagnosis not present

## 2021-02-16 ENCOUNTER — Ambulatory Visit
Admission: RE | Admit: 2021-02-16 | Discharge: 2021-02-16 | Disposition: A | Discharge status: Home / Self Care | Payer: Medicare Other | Source: Ambulatory Visit | Attending: Family Medicine | Admitting: Family Medicine

## 2021-02-16 ENCOUNTER — Other Ambulatory Visit: Payer: Self-pay

## 2021-02-16 DIAGNOSIS — Z1231 Encounter for screening mammogram for malignant neoplasm of breast: Secondary | ICD-10-CM | POA: Diagnosis not present

## 2021-02-16 DIAGNOSIS — E2839 Other primary ovarian failure: Secondary | ICD-10-CM

## 2021-02-16 DIAGNOSIS — M810 Age-related osteoporosis without current pathological fracture: Secondary | ICD-10-CM | POA: Diagnosis not present

## 2021-02-16 DIAGNOSIS — M85831 Other specified disorders of bone density and structure, right forearm: Secondary | ICD-10-CM | POA: Diagnosis not present

## 2021-02-16 DIAGNOSIS — Z780 Asymptomatic menopausal state: Secondary | ICD-10-CM | POA: Diagnosis not present

## 2021-03-05 DIAGNOSIS — R0902 Hypoxemia: Secondary | ICD-10-CM | POA: Diagnosis not present

## 2021-03-05 DIAGNOSIS — J9600 Acute respiratory failure, unspecified whether with hypoxia or hypercapnia: Secondary | ICD-10-CM | POA: Diagnosis not present

## 2021-03-05 DIAGNOSIS — J449 Chronic obstructive pulmonary disease, unspecified: Secondary | ICD-10-CM | POA: Diagnosis not present

## 2021-03-23 DIAGNOSIS — Z0000 Encounter for general adult medical examination without abnormal findings: Secondary | ICD-10-CM | POA: Diagnosis not present

## 2021-03-23 DIAGNOSIS — E7800 Pure hypercholesterolemia, unspecified: Secondary | ICD-10-CM | POA: Diagnosis not present

## 2021-03-23 DIAGNOSIS — I700 Atherosclerosis of aorta: Secondary | ICD-10-CM | POA: Diagnosis not present

## 2021-03-23 DIAGNOSIS — Z1389 Encounter for screening for other disorder: Secondary | ICD-10-CM | POA: Diagnosis not present

## 2021-03-23 DIAGNOSIS — R7303 Prediabetes: Secondary | ICD-10-CM | POA: Diagnosis not present

## 2021-03-23 DIAGNOSIS — I10 Essential (primary) hypertension: Secondary | ICD-10-CM | POA: Diagnosis not present

## 2021-03-23 DIAGNOSIS — D509 Iron deficiency anemia, unspecified: Secondary | ICD-10-CM | POA: Diagnosis not present

## 2021-03-23 DIAGNOSIS — M810 Age-related osteoporosis without current pathological fracture: Secondary | ICD-10-CM | POA: Diagnosis not present

## 2021-03-23 DIAGNOSIS — J449 Chronic obstructive pulmonary disease, unspecified: Secondary | ICD-10-CM | POA: Diagnosis not present

## 2021-03-23 DIAGNOSIS — E559 Vitamin D deficiency, unspecified: Secondary | ICD-10-CM | POA: Diagnosis not present

## 2021-03-23 DIAGNOSIS — R202 Paresthesia of skin: Secondary | ICD-10-CM | POA: Diagnosis not present

## 2021-03-23 DIAGNOSIS — I509 Heart failure, unspecified: Secondary | ICD-10-CM | POA: Diagnosis not present

## 2021-04-05 DIAGNOSIS — J449 Chronic obstructive pulmonary disease, unspecified: Secondary | ICD-10-CM | POA: Diagnosis not present

## 2021-04-05 DIAGNOSIS — R0902 Hypoxemia: Secondary | ICD-10-CM | POA: Diagnosis not present

## 2021-04-05 DIAGNOSIS — J9600 Acute respiratory failure, unspecified whether with hypoxia or hypercapnia: Secondary | ICD-10-CM | POA: Diagnosis not present

## 2021-04-13 DIAGNOSIS — N1831 Chronic kidney disease, stage 3a: Secondary | ICD-10-CM | POA: Diagnosis not present

## 2021-04-26 ENCOUNTER — Other Ambulatory Visit: Payer: Self-pay | Admitting: Cardiology

## 2021-05-06 DIAGNOSIS — J9600 Acute respiratory failure, unspecified whether with hypoxia or hypercapnia: Secondary | ICD-10-CM | POA: Diagnosis not present

## 2021-05-06 DIAGNOSIS — J449 Chronic obstructive pulmonary disease, unspecified: Secondary | ICD-10-CM | POA: Diagnosis not present

## 2021-05-06 DIAGNOSIS — R0902 Hypoxemia: Secondary | ICD-10-CM | POA: Diagnosis not present

## 2021-05-17 ENCOUNTER — Other Ambulatory Visit: Payer: Self-pay | Admitting: Family Medicine

## 2021-05-17 DIAGNOSIS — R911 Solitary pulmonary nodule: Secondary | ICD-10-CM

## 2021-05-19 DIAGNOSIS — I10 Essential (primary) hypertension: Secondary | ICD-10-CM | POA: Diagnosis not present

## 2021-05-19 DIAGNOSIS — M810 Age-related osteoporosis without current pathological fracture: Secondary | ICD-10-CM | POA: Diagnosis not present

## 2021-05-19 DIAGNOSIS — J449 Chronic obstructive pulmonary disease, unspecified: Secondary | ICD-10-CM | POA: Diagnosis not present

## 2021-05-19 DIAGNOSIS — I509 Heart failure, unspecified: Secondary | ICD-10-CM | POA: Diagnosis not present

## 2021-05-19 DIAGNOSIS — E7800 Pure hypercholesterolemia, unspecified: Secondary | ICD-10-CM | POA: Diagnosis not present

## 2021-05-19 DIAGNOSIS — G459 Transient cerebral ischemic attack, unspecified: Secondary | ICD-10-CM | POA: Diagnosis not present

## 2021-05-19 DIAGNOSIS — N1831 Chronic kidney disease, stage 3a: Secondary | ICD-10-CM | POA: Diagnosis not present

## 2021-05-19 DIAGNOSIS — G4700 Insomnia, unspecified: Secondary | ICD-10-CM | POA: Diagnosis not present

## 2021-05-31 ENCOUNTER — Other Ambulatory Visit: Payer: Self-pay

## 2021-05-31 ENCOUNTER — Ambulatory Visit (INDEPENDENT_AMBULATORY_CARE_PROVIDER_SITE_OTHER): Payer: Medicare Other | Admitting: Pulmonary Disease

## 2021-05-31 ENCOUNTER — Encounter: Payer: Self-pay | Admitting: Pulmonary Disease

## 2021-05-31 VITALS — BP 128/70 | HR 98 | Temp 98.3°F | Ht 61.0 in | Wt 157.6 lb

## 2021-05-31 DIAGNOSIS — J449 Chronic obstructive pulmonary disease, unspecified: Secondary | ICD-10-CM

## 2021-05-31 NOTE — Progress Notes (Signed)
Katie Waller    [id number]    01/05/1946  Primary Care Physician:Reade, Molly Maduro, MD  Referring Physician: Elias Else, MD [phone] [place],  [place] [address]  Chief complaint: Follow-up for COPD GOLD D  HPI: 75 year old with severe COPD [CAT Score 13].   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  She is placed on BiPAP, treated with steroids, antibiotics, nebulizer.  Also noted to have acute on chronic diastolic heart failure which was treated with IV Lasix.  Discharged to rehab facility.  She is since returned to home and continues with exercise regimen at home with home health.    Did not like Brovana and Pulmicort since she does not like a mask on the face Tried Trelegy in march 2019 but preferred to go back on symbicort and spiriva  Pets: Dogs.  No cats, birds, farm animals Occupation: Retired as an Print production planner.   Exposures: No known exposures, no mold, hot tub. Smoking history: 55-pack-year smoking.  Quit in November 2018 Travel History: Not significant  Interim History: Continues to be stable. Reports occasional productive cough but symptoms have been relatively stable. Continues to have dyspnea on exertion. Requiring 3 L [place] at home. Per daughter, gets out of breath easily with minimal exertion. Compliant with her Spiriva and symbicort  LE edema has improved with compression socks.   Outpatient Encounter Medications as of 05/31/2021  Medication Sig   albuterol (PROVENTIL HFA;VENTOLIN HFA) 108 (90 BASE) MCG/ACT inhaler Inhale 2 puffs into the lungs every 6 (six) hours as needed for wheezing or shortness of breath.   amLODipine (NORVASC) 5 MG tablet Take 5 mg by mouth every morning.   aspirin 81 MG chewable tablet Chew 1 tablet (81 mg total) by mouth daily.   atorvastatin (LIPITOR) 10 MG tablet TAKE 1 TABLET BY MOUTH EVERY DAY   benzonatate (TESSALON) 100 MG capsule Take 100 mg by mouth 3  (three) times daily as needed for cough.    budesonide-formoterol (SYMBICORT) 160-4.5 MCG/ACT inhaler Inhale 2 puffs into the lungs 2 (two) times daily.   Calcium 600-200 MG-UNIT tablet Take 1 tablet by mouth daily.   [employer] VITAMIN D3 250 MCG (10000 UT) CAPS Take 1 capsule by mouth daily.   furosemide (LASIX) 40 MG tablet Take 40 mg by mouth daily as needed for fluid or edema.    gabapentin (NEURONTIN) 300 MG capsule Take 600 mg by mouth at bedtime.   iron polysaccharides (NIFEREX) 150 MG capsule Take 150 mg by mouth daily.   LORazepam (ATIVAN) 1 MG tablet Take 1 tablet (1 mg total) by mouth every 6 (six) hours as needed for anxiety.   losartan (COZAAR) 50 MG tablet Take 50 mg by mouth daily.   Multiple Vitamin (MULTIVITAMIN) tablet Take 1 tablet by mouth daily.   raloxifene (EVISTA) 60 MG tablet Take 60 mg by mouth every morning.   tiotropium (SPIRIVA) 18 MCG inhalation capsule Place 18 mcg into inhaler and inhale daily.   traZODone (DESYREL) 100 MG tablet Take 1 tablet (100 mg total) by mouth at bedtime as needed for sleep.   No facility-administered encounter medications on file as of 05/31/2021.   Physical Exam: Blood pressure 122/82, pulse 81, temperature 98.3 F (36.8 C), temperature source Oral, height 5\' 1"  (1.549 m), weight 151 lb 12.8 oz (68.9 kg), SpO2 94 %. Gen:      Pleasant elderly woman. No acute distress HEENT:  EOMI, sclera anicteric Neck:     No masses; no thyromegaly Lungs:    On portable O2 @ 3LNC. Decreased air movement throughout lung fields. No wheezing or rales.  CV:         Regular rate and rhythm; no murmurs Abd:      + bowel sounds; soft, non-tender; no palpable masses, no distension Ext:    No edema; adequate peripheral perfusion Skin:      Warm and dry; no rash Neuro: alert and oriented x 3 Psych: normal mood and affect  Data Reviewed: Imaging CT chest 07/31/17-no pulmonary embolism, emphysematous changes, dependent atelectasis.  Low-density lesion in the  liver.  Screening CT chest 09/03/2018-subcentimeter pulmonary nodules, emphysema  Screening CT chest 10/09/2019-severe emphysema, stable subcentimeter pulmonary nodules.  Low-density lesion in the liver.  I have reviewed the images personally. I have reviewed the images personally  Liver 08/02/17-Small Cystic Areas within the Left hepatic lobe compatible with benign cyst.  Follow-up imaging recommended in 6-12 months.   PFTs 10/02/17 FVC 1.6 [60%], FEV1 0.73 [36%], F/F 45, TLC 129%, 3/0 369%, DLCO 33% Severe obstruction, bronchodilator response, hyperinflation with air trapping, severe diffusion impairment.  Labs: CBC differential 12/24/2018-WBC 11.3, eos 1%, absolute eosinophil count 113 IgE 05/06/2018-133 Alpha-1 antitrypsin 09/05/2017-178, PI MM  Cardiac Echo 11/22/2018 LVEF 55-60%, mild LVH, no pulmonary hypertension  Assessment:  COPD Gold D Remains relatively stable on Symbicort 160/4.5 and Spiriva. Continues to have dyspnea on exertion and occasional productive cough. Element of CHF contributing to her dyspnea on exertion. Will need to optimize HF therapy.  On 3 L [place] supplemental oxygen via portable concentrator with O2 sats in the low-to-mid 90s. No recent COPD exacerbations   Former smoker Screening CTs reviewed with no concerning findings. Next CT scheduled for 06/08/21.   Health maintenance 2/90/15- Prevnar 12/08/2014-Pneumovax  Plan/Recommendations: - Continue Symbicort, Spiriva - Continue supplemental oxygen, rehab - Pending Low-dose screening CT next week.   Chilton Greathouse MD [facility] 05/31/2021, 10:12 AM  CC: Elias Else, MD

## 2021-05-31 NOTE — Patient Instructions (Signed)
Continue the inhalers Your O2 levels dropped a little bit on exertion and will increase the oxygen prescription to 5 L Your follow-up CT has been scheduled for early October  Return to clinic in 1 year.

## 2021-06-01 ENCOUNTER — Encounter: Payer: Self-pay | Admitting: Pulmonary Disease

## 2021-06-05 DIAGNOSIS — R0902 Hypoxemia: Secondary | ICD-10-CM | POA: Diagnosis not present

## 2021-06-05 DIAGNOSIS — J9600 Acute respiratory failure, unspecified whether with hypoxia or hypercapnia: Secondary | ICD-10-CM | POA: Diagnosis not present

## 2021-06-05 DIAGNOSIS — J441 Chronic obstructive pulmonary disease with (acute) exacerbation: Secondary | ICD-10-CM | POA: Diagnosis not present

## 2021-06-05 DIAGNOSIS — J449 Chronic obstructive pulmonary disease, unspecified: Secondary | ICD-10-CM | POA: Diagnosis not present

## 2021-06-08 ENCOUNTER — Ambulatory Visit
Admission: RE | Admit: 2021-06-08 | Discharge: 2021-06-08 | Disposition: A | Discharge status: Home / Self Care | Payer: Medicare Other | Source: Ambulatory Visit | Attending: Family Medicine | Admitting: Family Medicine

## 2021-06-08 ENCOUNTER — Inpatient Hospital Stay: Admission: RE | Admit: 2021-06-08 | Payer: Medicare Other | Source: Ambulatory Visit

## 2021-06-08 DIAGNOSIS — J439 Emphysema, unspecified: Secondary | ICD-10-CM | POA: Diagnosis not present

## 2021-06-08 DIAGNOSIS — I700 Atherosclerosis of aorta: Secondary | ICD-10-CM | POA: Diagnosis not present

## 2021-06-08 DIAGNOSIS — R911 Solitary pulmonary nodule: Secondary | ICD-10-CM

## 2021-06-10 ENCOUNTER — Ambulatory Visit (INDEPENDENT_AMBULATORY_CARE_PROVIDER_SITE_OTHER): Payer: Medicare Other | Admitting: Cardiology

## 2021-06-10 ENCOUNTER — Other Ambulatory Visit: Payer: Self-pay

## 2021-06-10 ENCOUNTER — Encounter: Payer: Self-pay | Admitting: Cardiology

## 2021-06-10 VITALS — BP 122/84 | HR 89 | Ht 61.0 in | Wt 155.0 lb

## 2021-06-10 DIAGNOSIS — I10 Essential (primary) hypertension: Secondary | ICD-10-CM

## 2021-06-10 DIAGNOSIS — I5032 Chronic diastolic (congestive) heart failure: Secondary | ICD-10-CM | POA: Diagnosis not present

## 2021-06-10 NOTE — Patient Instructions (Signed)

## 2021-06-10 NOTE — Progress Notes (Signed)
Cardiology Office Note:    Date:  06/10/2021   ID:  Katie Waller, DOB June 19, 1946, MRN [id number]  PCP:  Elias Else, MD  Cardiologist:  Gypsy Balsam, MD    Referring MD: No ref. provider found   Chief Complaint  Patient presents with   Follow-up    History of Present Illness:    Katie Waller is a 75 y.o. female with past medical history significant for COPD, she is chronically on oxygen, diastolic congestive heart failure, peripheral vascular disease in form of carotic arterial disease with latest assessment of up to 39% stenosis. She comes today 2 months of follow-up overall she seems to be doing well.  She denies have any chest pain tightness squeezing pressure burning chest she is oxygen dependent she sits on the chair all the time does not move much.  Yesterday she had CT of her chest done again because she did have some pulmonary nodule apparently old nodule disappeared all got smaller rather but however the new nodule showing up officially this is concerning plan is to treat with antibiotic and repeat CT within 3 months.  Past Medical History:  Diagnosis Date   Abnormal finding on EKG 09/19/2013   Acute on chronic respiratory failure with hypercapnia (HCC)    Acute respiratory failure (HCC) 06/08/2016   Anxiety 11/09/2020   Asthma    Carotid bruit 11/09/2020   Chronic diastolic CHF (congestive heart failure) (HCC) 07/28/2017   Chronic obstructive pulmonary disease, unspecified (HCC) 11/09/2020   COPD, group D, by GOLD 2017 classification (HCC) 09/17/2013   Cough 11/09/2020   Decreased estrogen level 11/09/2020   Diabetes (HCC)    Edema 11/09/2020   Essential hypertension 09/18/2013   Goals of care, counseling/discussion    Hardening of the aorta (main artery of the heart) (HCC) 11/09/2020   Heart disease    Hypertension    Hypertensive heart failure (HCC) 11/09/2020   Hypoxia 11/09/2020   Insomnia 11/09/2020   Iron deficiency anemia 11/09/2020   Large liver 11/09/2020   Near  syncope 09/17/2013   Osteoporosis 11/09/2020   Palliative care encounter    Prediabetes 09/19/2013   Pure hypercholesterolemia 11/09/2020   Raynaud's disease 11/09/2020   Sinus tachycardia 09/18/2013   Skin sensation disturbance 11/09/2020   Smoker 09/17/2013   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   amLODipine (NORVASC) 5 MG tablet Take 5 mg by mouth every morning.   aspirin 81 MG chewable tablet Chew 1 tablet (81 mg total) by mouth daily.   atorvastatin (LIPITOR) 10 MG tablet TAKE 1 TABLET BY MOUTH EVERY DAY (Patient taking differently: Take 10 mg by mouth daily.)   benzonatate (TESSALON) 100 MG capsule Take 100 mg by mouth 3 (three) times daily as needed for cough.    budesonide-formoterol (SYMBICORT) 160-4.5 MCG/ACT inhaler Inhale 2 puffs into the lungs 2 (two) times daily.   Calcium 600-200 MG-UNIT tablet Take 1 tablet by mouth daily.   [employer]S VITAMIN D3 250 MCG (10000 UT) CAPS Take 1 capsule by mouth daily.   furosemide (LASIX) 40 MG tablet Take 40 mg by mouth  daily as needed for fluid or edema.    gabapentin (NEURONTIN) 300 MG capsule Take 900 mg by mouth at bedtime.   iron polysaccharides (NIFEREX) 150 MG capsule Take 150 mg by mouth daily.   LORazepam (ATIVAN) 1 MG tablet Take 1 tablet (1 mg total) by mouth every 6 (six) hours as needed for anxiety.   losartan (COZAAR) 50 MG tablet Take 50 mg by mouth daily.   raloxifene (EVISTA) 60 MG tablet Take 60 mg by mouth every morning.   tiotropium (SPIRIVA) 18 MCG inhalation capsule Place 18 mcg into inhaler and inhale daily.   traZODone (DESYREL) 100 MG tablet  Take 1 tablet (100 mg total) by mouth at bedtime as needed for sleep.     Allergies:   Ace inhibitors, Diphenhydramine, Doxycycline hyclate, and Codeine   Social History   Socioeconomic History   Marital status: Divorced    Spouse name: Not on file   Number of children: Not on file   Years of education: Not on file   Highest education level: Not on file  Occupational History   Not on file  Tobacco Use   Smoking status: Former    Packs/day: 1.00    Years: 55.00    Pack years: 55.00    Types: Cigarettes    Start date: 07/22/2017    Quit date: 07/23/2017    Years since quitting: 3.8   Smokeless tobacco: Never  Vaping Use   Vaping Use: Former  Substance and Sexual Activity   Alcohol use: No   Drug use: No   Sexual activity: Never  Other Topics Concern   Not on file  Social History Narrative   ** Merged History Encounter **       Social Determinants of Health   Financial Resource Strain: Not on file  Food Insecurity: Not on file  Transportation Needs: Not on file  Physical Activity: Not on file  Stress: Not on file  Social Connections: Not on file     Family History: The patient's family history includes Hyperlipidemia in her father and mother; Hypertension in her father and mother. ROS:   Please see the history of present illness.    All 14 point review of systems negative except as described per history of present illness  EKGs/Labs/Other Studies Reviewed:      Recent Labs: No results found for requested labs within last 8760 hours.  Recent Lipid Panel    Component Value Date/Time   CHOL 169 09/18/2013 0614   TRIG 134 09/18/2013 0614   HDL 76 09/18/2013 0614   CHOLHDL 2.2 09/18/2013 0614   VLDL 27 09/18/2013 0614   LDLCALC 66 09/18/2013 0614    Physical Exam:    VS:  BP 122/84 (BP Location: Right Arm, Patient Position: Sitting)   Pulse 89   Ht 5\' 1"  (1.549 m)   Wt 155 lb (70.3 kg)   SpO2 93%   BMI 29.29 kg/m     Wt Readings from Last 3  Encounters:  06/10/21 155 lb (70.3 kg)  05/31/21 157 lb 9.6 oz (71.5 kg)  11/12/20 157 lb (71.2 kg)     GEN:  Well nourished, well developed in no acute distress HEENT: Normal NECK: No JVD; No carotid bruits LYMPHATICS: No lymphadenopathy CARDIAC: RRR, no murmurs, no rubs, no gallops RESPIRATORY: Poor air entry bilaterally ABDOMEN: Soft, non-tender, non-distended MUSCULOSKELETAL:  No edema; No deformity  SKIN: Warm and dry LOWER EXTREMITIES: no swelling NEUROLOGIC:  Alert and oriented x 3 PSYCHIATRIC:  Normal affect   ASSESSMENT:    1. Essential hypertension   2. Chronic diastolic CHF (congestive heart failure) (HCC)    PLAN:    In order of problems listed above:  Essential hypertension blood pressure well controlled continue present management. Dyslipidemia I did review her K PN which show me LDL of 71 HDL 80 she is on Lipitor 10 which I will continue for now. Chronic diastolic congestive heart failure appears to be compensated on appropriate medications which I will continue. Peripheral vascular disease.  Carotic ultrasounds reviewed with the patient no critical lesions she is on antiplatelet therapy which I will continue.  She is also on statin which I will continue.   Medication Adjustments/Labs and Tests Ordered: Current medicines are reviewed at length with the patient today.  Concerns regarding medicines are outlined above.  Orders Placed This Encounter  Procedures   EKG 12-Lead   Medication changes: No orders of the defined types were placed in this encounter.   Signed, Georgeanna Lea, MD, [facility] 06/10/2021 12:20 PM    [facility]

## 2021-06-18 ENCOUNTER — Other Ambulatory Visit: Payer: Self-pay | Admitting: Family Medicine

## 2021-06-18 DIAGNOSIS — E7800 Pure hypercholesterolemia, unspecified: Secondary | ICD-10-CM | POA: Diagnosis not present

## 2021-06-18 DIAGNOSIS — N1831 Chronic kidney disease, stage 3a: Secondary | ICD-10-CM | POA: Diagnosis not present

## 2021-06-18 DIAGNOSIS — I10 Essential (primary) hypertension: Secondary | ICD-10-CM | POA: Diagnosis not present

## 2021-06-18 DIAGNOSIS — M810 Age-related osteoporosis without current pathological fracture: Secondary | ICD-10-CM | POA: Diagnosis not present

## 2021-06-18 DIAGNOSIS — I509 Heart failure, unspecified: Secondary | ICD-10-CM | POA: Diagnosis not present

## 2021-06-18 DIAGNOSIS — G459 Transient cerebral ischemic attack, unspecified: Secondary | ICD-10-CM | POA: Diagnosis not present

## 2021-06-18 DIAGNOSIS — D509 Iron deficiency anemia, unspecified: Secondary | ICD-10-CM | POA: Diagnosis not present

## 2021-06-18 DIAGNOSIS — G4700 Insomnia, unspecified: Secondary | ICD-10-CM | POA: Diagnosis not present

## 2021-06-18 DIAGNOSIS — R911 Solitary pulmonary nodule: Secondary | ICD-10-CM

## 2021-06-18 DIAGNOSIS — J441 Chronic obstructive pulmonary disease with (acute) exacerbation: Secondary | ICD-10-CM | POA: Diagnosis not present

## 2021-06-18 DIAGNOSIS — J449 Chronic obstructive pulmonary disease, unspecified: Secondary | ICD-10-CM | POA: Diagnosis not present

## 2021-06-22 ENCOUNTER — Telehealth: Payer: Self-pay | Admitting: Acute Care

## 2021-06-22 DIAGNOSIS — Z87891 Personal history of nicotine dependence: Secondary | ICD-10-CM

## 2021-06-22 NOTE — Telephone Encounter (Signed)
Katie Waller , Dr Nicholos Johns has already placed an order for a 3 mth nodule f/u CT and it is scheduled for 09/15/21.

## 2021-06-22 NOTE — Telephone Encounter (Signed)
I have called the patient with the results of her low dose CT. I explained that the nodule we were concerned about in her 06/09/2021.decreased in size. I also explained that there was a new nodule that had developed since the previous scan. The scan was read as a Lung RADS 4 B indicates suspicious findings for which additional diagnostic testing and or tissue sampling is recommended. I asked the patient if they were sick. She stated that she has been sick, and that her PCP has actually placed her on antibiotics to treat this.  I reviewed the scan with Dr. Tonia Brooms, and we will re scan the patient in 3 months to re-evaluate the nodule.  Angelique Blonder, please place order for 3 month follow up low dose CT Chest, and fax results to PCP, let them know we think this is infectious / inflammatory and that we will do a close term 3 month follow up to re-evaluate.  Thanks so much

## 2021-06-22 NOTE — Progress Notes (Signed)
I have called these results to the patient . Please see telephone note dated 06/22/2021

## 2021-07-19 DIAGNOSIS — D509 Iron deficiency anemia, unspecified: Secondary | ICD-10-CM | POA: Diagnosis not present

## 2021-07-19 DIAGNOSIS — M810 Age-related osteoporosis without current pathological fracture: Secondary | ICD-10-CM | POA: Diagnosis not present

## 2021-07-19 DIAGNOSIS — N1831 Chronic kidney disease, stage 3a: Secondary | ICD-10-CM | POA: Diagnosis not present

## 2021-07-19 DIAGNOSIS — E7800 Pure hypercholesterolemia, unspecified: Secondary | ICD-10-CM | POA: Diagnosis not present

## 2021-07-19 DIAGNOSIS — J449 Chronic obstructive pulmonary disease, unspecified: Secondary | ICD-10-CM | POA: Diagnosis not present

## 2021-07-19 DIAGNOSIS — J441 Chronic obstructive pulmonary disease with (acute) exacerbation: Secondary | ICD-10-CM | POA: Diagnosis not present

## 2021-07-19 DIAGNOSIS — I509 Heart failure, unspecified: Secondary | ICD-10-CM | POA: Diagnosis not present

## 2021-07-19 DIAGNOSIS — I10 Essential (primary) hypertension: Secondary | ICD-10-CM | POA: Diagnosis not present

## 2021-07-19 DIAGNOSIS — G4700 Insomnia, unspecified: Secondary | ICD-10-CM | POA: Diagnosis not present

## 2021-07-19 DIAGNOSIS — I110 Hypertensive heart disease with heart failure: Secondary | ICD-10-CM | POA: Diagnosis not present

## 2021-07-19 DIAGNOSIS — G459 Transient cerebral ischemic attack, unspecified: Secondary | ICD-10-CM | POA: Diagnosis not present

## 2021-09-01 DIAGNOSIS — J449 Chronic obstructive pulmonary disease, unspecified: Secondary | ICD-10-CM | POA: Diagnosis not present

## 2021-09-01 DIAGNOSIS — R0902 Hypoxemia: Secondary | ICD-10-CM | POA: Diagnosis not present

## 2021-09-01 DIAGNOSIS — J9600 Acute respiratory failure, unspecified whether with hypoxia or hypercapnia: Secondary | ICD-10-CM | POA: Diagnosis not present

## 2021-09-01 DIAGNOSIS — J441 Chronic obstructive pulmonary disease with (acute) exacerbation: Secondary | ICD-10-CM | POA: Diagnosis not present

## 2021-09-02 DIAGNOSIS — W19XXXA Unspecified fall, initial encounter: Secondary | ICD-10-CM | POA: Diagnosis not present

## 2021-09-02 DIAGNOSIS — S92901A Unspecified fracture of right foot, initial encounter for closed fracture: Secondary | ICD-10-CM | POA: Diagnosis not present

## 2021-09-03 DIAGNOSIS — M79671 Pain in right foot: Secondary | ICD-10-CM | POA: Diagnosis not present

## 2021-09-03 DIAGNOSIS — M25571 Pain in right ankle and joints of right foot: Secondary | ICD-10-CM | POA: Diagnosis not present

## 2021-09-08 DIAGNOSIS — J441 Chronic obstructive pulmonary disease with (acute) exacerbation: Secondary | ICD-10-CM | POA: Diagnosis not present

## 2021-09-08 DIAGNOSIS — G4700 Insomnia, unspecified: Secondary | ICD-10-CM | POA: Diagnosis not present

## 2021-09-08 DIAGNOSIS — I509 Heart failure, unspecified: Secondary | ICD-10-CM | POA: Diagnosis not present

## 2021-09-08 DIAGNOSIS — N1831 Chronic kidney disease, stage 3a: Secondary | ICD-10-CM | POA: Diagnosis not present

## 2021-09-08 DIAGNOSIS — E7800 Pure hypercholesterolemia, unspecified: Secondary | ICD-10-CM | POA: Diagnosis not present

## 2021-09-08 DIAGNOSIS — M810 Age-related osteoporosis without current pathological fracture: Secondary | ICD-10-CM | POA: Diagnosis not present

## 2021-09-08 DIAGNOSIS — I10 Essential (primary) hypertension: Secondary | ICD-10-CM | POA: Diagnosis not present

## 2021-09-08 DIAGNOSIS — D509 Iron deficiency anemia, unspecified: Secondary | ICD-10-CM | POA: Diagnosis not present

## 2021-09-08 DIAGNOSIS — I110 Hypertensive heart disease with heart failure: Secondary | ICD-10-CM | POA: Diagnosis not present

## 2021-09-08 DIAGNOSIS — G459 Transient cerebral ischemic attack, unspecified: Secondary | ICD-10-CM | POA: Diagnosis not present

## 2021-09-15 ENCOUNTER — Ambulatory Visit
Admission: RE | Admit: 2021-09-15 | Discharge: 2021-09-15 | Disposition: A | Discharge status: Home / Self Care | Payer: Commercial Managed Care - HMO | Source: Ambulatory Visit | Attending: Family Medicine | Admitting: Family Medicine

## 2021-09-15 DIAGNOSIS — R918 Other nonspecific abnormal finding of lung field: Secondary | ICD-10-CM | POA: Diagnosis not present

## 2021-09-15 DIAGNOSIS — J439 Emphysema, unspecified: Secondary | ICD-10-CM | POA: Diagnosis not present

## 2021-09-15 DIAGNOSIS — R911 Solitary pulmonary nodule: Secondary | ICD-10-CM

## 2021-09-15 DIAGNOSIS — I3139 Other pericardial effusion (noninflammatory): Secondary | ICD-10-CM | POA: Diagnosis not present

## 2021-09-23 ENCOUNTER — Telehealth: Payer: Self-pay | Admitting: Acute Care

## 2021-09-23 NOTE — Telephone Encounter (Signed)
I have called the patient with the results of her low-dose CT.  I explained that her scan was read as a lung RADS 3, probably benign findings.  There was notation of a stable clustered nodule of the left upper lobe 8.2 mm in mean diameter that could potentially be postinfectious.  When discussing with the patient she states she has had a cold and she did sound congested.  Plan is for a 29-month follow-up low-dose CT which will be due in July 2023.  Patient verbalized understanding and is in agreement with the above plan. Angelique Blonder please place order for low-dose CT in July 2023 and fax results to PCP.  Please let him know plan is for a 107-month follow-up thanks so much

## 2021-09-23 NOTE — Telephone Encounter (Signed)
Listed below is impression from LDCT done by Dr Alyson Ingles on 09/15/21 ( She was originally screening by [place]) :   IMPRESSION: 1. Stable clustered nodules of the left upper lobe, largest measures 8.2 mm in mean diameter, potentially post infectious, although indolent primary lung neoplasm cannot be excluded. Lung-RADS 3, probably benign findings. Short-term follow-up in 6 months is recommended with repeat low-dose chest CT without contrast (please use the following order, "CT CHEST LCS NODULE FOLLOW-UP W/O CM"). 2. Three-vessel coronary artery calcifications. 3. Aortic Atherosclerosis (ICD10-I70.0) and Emphysema (ICD10-J43.9).

## 2021-09-24 NOTE — Telephone Encounter (Signed)
Ct results faxed to PCP with plans to repeat  CT in 6 months. Order placed for 6 mth f/u low dose CT.

## 2021-09-24 NOTE — Telephone Encounter (Signed)
See telephone note dated 06/22/21

## 2021-09-24 NOTE — Addendum Note (Signed)
Addended by: Abigail Miyamoto D on: 09/24/2021 08:29 AM   Modules accepted: Orders

## 2021-10-04 DIAGNOSIS — M25571 Pain in right ankle and joints of right foot: Secondary | ICD-10-CM | POA: Diagnosis not present

## 2021-10-04 DIAGNOSIS — M79671 Pain in right foot: Secondary | ICD-10-CM | POA: Diagnosis not present

## 2021-10-05 DIAGNOSIS — I10 Essential (primary) hypertension: Secondary | ICD-10-CM | POA: Diagnosis not present

## 2021-10-05 DIAGNOSIS — E7800 Pure hypercholesterolemia, unspecified: Secondary | ICD-10-CM | POA: Diagnosis not present

## 2021-10-05 DIAGNOSIS — R7303 Prediabetes: Secondary | ICD-10-CM | POA: Diagnosis not present

## 2021-10-05 DIAGNOSIS — J449 Chronic obstructive pulmonary disease, unspecified: Secondary | ICD-10-CM | POA: Diagnosis not present

## 2021-10-05 DIAGNOSIS — D509 Iron deficiency anemia, unspecified: Secondary | ICD-10-CM | POA: Diagnosis not present

## 2021-10-05 DIAGNOSIS — M810 Age-related osteoporosis without current pathological fracture: Secondary | ICD-10-CM | POA: Diagnosis not present

## 2021-10-05 DIAGNOSIS — I509 Heart failure, unspecified: Secondary | ICD-10-CM | POA: Diagnosis not present

## 2021-10-05 DIAGNOSIS — N1831 Chronic kidney disease, stage 3a: Secondary | ICD-10-CM | POA: Diagnosis not present

## 2021-10-05 DIAGNOSIS — E559 Vitamin D deficiency, unspecified: Secondary | ICD-10-CM | POA: Diagnosis not present

## 2021-10-05 DIAGNOSIS — R8290 Unspecified abnormal findings in urine: Secondary | ICD-10-CM | POA: Diagnosis not present

## 2021-10-05 DIAGNOSIS — I700 Atherosclerosis of aorta: Secondary | ICD-10-CM | POA: Diagnosis not present

## 2021-11-02 DIAGNOSIS — N1831 Chronic kidney disease, stage 3a: Secondary | ICD-10-CM | POA: Diagnosis not present

## 2021-11-15 ENCOUNTER — Other Ambulatory Visit: Payer: Self-pay | Admitting: Cardiology

## 2021-11-26 ENCOUNTER — Other Ambulatory Visit: Payer: Self-pay

## 2021-11-26 ENCOUNTER — Encounter: Payer: Self-pay | Admitting: Cardiology

## 2021-11-26 ENCOUNTER — Ambulatory Visit (INDEPENDENT_AMBULATORY_CARE_PROVIDER_SITE_OTHER): Payer: Medicare Other | Admitting: Cardiology

## 2021-11-26 VITALS — BP 130/72 | HR 112 | Ht 61.0 in | Wt 155.0 lb

## 2021-11-26 DIAGNOSIS — I6523 Occlusion and stenosis of bilateral carotid arteries: Secondary | ICD-10-CM

## 2021-11-26 DIAGNOSIS — E559 Vitamin D deficiency, unspecified: Secondary | ICD-10-CM | POA: Insufficient documentation

## 2021-11-26 DIAGNOSIS — J449 Chronic obstructive pulmonary disease, unspecified: Secondary | ICD-10-CM | POA: Diagnosis not present

## 2021-11-26 DIAGNOSIS — I10 Essential (primary) hypertension: Secondary | ICD-10-CM | POA: Diagnosis not present

## 2021-11-26 DIAGNOSIS — N1831 Chronic kidney disease, stage 3a: Secondary | ICD-10-CM | POA: Insufficient documentation

## 2021-11-26 DIAGNOSIS — I5032 Chronic diastolic (congestive) heart failure: Secondary | ICD-10-CM | POA: Diagnosis not present

## 2021-11-26 DIAGNOSIS — I779 Disorder of arteries and arterioles, unspecified: Secondary | ICD-10-CM | POA: Insufficient documentation

## 2021-11-26 NOTE — Progress Notes (Signed)
?Cardiology Office Note:   ? ?Date:  11/26/2021  ? ?IDHarland Dingwall:  Katie Waller, DOB 1946/07/22, MRN [id number] ? ?PCP:  Elias Elseeade, Danaisha Celli, MD  ?Cardiologist:  Gypsy Balsamobert Darrius Montano, MD   ? ?Referring MD: Elias Elseeade, Ginamarie Banfield, MD  ? ?Chief Complaint  ?Patient presents with  ? Follow-up  ?Doing fine ? ?History of Present Illness:   ? ?Katie Waller is a 76 y.o. female with past medical history significant for advanced COPD, she is chronically on oxygen, diastolic congestive heart failure, peripheral vascular disease involved cardiac arterial disease. ?She is coming today to my office for follow-up.  Overall doing well.  Shortness of breath still there.  She is on the wheelchair and she is a with oxygen all the time.  She said it does not get any better.  There is no chest pain no swelling of lower extremities no palpitations no TIA/CVA-like symptoms. ? ?Past Medical History:  ?Diagnosis Date  ? Abnormal finding on EKG 09/19/2013  ? Acute on chronic respiratory failure with hypercapnia (HCC)   ? Acute respiratory failure (HCC) 06/08/2016  ? Anxiety 11/09/2020  ? Asthma   ? Carotid bruit 11/09/2020  ? Chronic diastolic CHF (congestive heart failure) (HCC) 07/28/2017  ? Chronic obstructive pulmonary disease, unspecified (HCC) 11/09/2020  ? COPD, group D, by GOLD 2017 classification (HCC) 09/17/2013  ? Cough 11/09/2020  ? Decreased estrogen level 11/09/2020  ? Diabetes (HCC)   ? Edema 11/09/2020  ? Essential hypertension 09/18/2013  ? Goals of care, counseling/discussion   ? Hardening of the aorta (main artery of the heart) (HCC) 11/09/2020  ? Heart disease   ? Hypertension   ? Hypertensive heart failure (HCC) 11/09/2020  ? Hypoxia 11/09/2020  ? Insomnia 11/09/2020  ? Iron deficiency anemia 11/09/2020  ? Large liver 11/09/2020  ? Near syncope 09/17/2013  ? Osteoporosis 11/09/2020  ? Palliative care encounter   ? Prediabetes 09/19/2013  ? Pure hypercholesterolemia 11/09/2020  ? Raynaud's disease 11/09/2020  ? Sinus tachycardia 09/18/2013  ? Skin sensation disturbance 11/09/2020   ? Smoker 09/17/2013  ? 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 Transient ischemic attack 11/09/2020  ? Venous stasis of both lower extremities 06/13/2016  ? ? ?Past Surgical History:  ?Procedure Laterality Date  ? KNEE SURGERY Left   ? ? ?Current Medications: ?Current Meds  ?Medication Sig  ? albuterol (PROVENTIL HFA;VENTOLIN HFA) 108 (90 BASE) MCG/ACT inhaler Inhale 2 puffs into the lungs every 6 (six) hours as needed for wheezing or shortness of breath.  ? amLODipine (NORVASC) 5 MG tablet Take 5 mg by mouth every morning.  ? aspirin 81 MG chewable tablet Chew 1 tablet (81 mg total) by mouth daily.  ? atorvastatin (LIPITOR) 10 MG tablet TAKE 1 TABLET BY MOUTH EVERY DAY  ? benzonatate (TESSALON) 100 MG capsule Take 100 mg by mouth 3 (three) times daily as needed for cough.   ? budesonide-formoterol (SYMBICORT) 160-4.5 MCG/ACT inhaler Inhale 2 puffs into the lungs 2 (two) times daily.  ? Calcium 600-200 MG-UNIT tablet Take 1 tablet by mouth daily.  ? CVS VITAMIN D3 250 MCG (10000 UT) CAPS Take 1 capsule by mouth daily.  ? furosemide (LASIX) 40 MG tablet Take 40 mg by mouth daily as needed for fluid or edema.   ? gabapentin (NEURONTIN) 300 MG capsule Take 900 mg by mouth at bedtime.  ? iron polysaccharides (NIFEREX) 150 MG capsule Take 150 mg by mouth daily.  ? LORazepam (ATIVAN) 1 MG tablet Take 1 tablet (1 mg total)  by mouth every 6 (six) hours as needed for anxiety.  ? losartan (COZAAR) 50 MG tablet Take 50 mg by mouth daily.  ? raloxifene (EVISTA) 60 MG tablet Take 60 mg by mouth every morning.  ? tiotropium (SPIRIVA) 18 MCG inhalation capsule Place 18 mcg into inhaler and inhale daily.  ? traZODone (DESYREL) 100 MG tablet Take 1 tablet (100 mg total) by mouth at bedtime as needed for sleep.  ?  ? ?Allergies:   Ace inhibitors, Diphenhydramine, Doxycycline hyclate, and Codeine  ? ?Social History  ? ?Socioeconomic History  ? Marital status: Divorced  ?  Spouse name: Not on file  ? Number of children: Not on file  ?  Years of education: Not on file  ? Highest education level: Not on file  ?Occupational History  ? Not on file  ?Tobacco Use  ? Smoking status: Former  ?  Packs/day: 1.00  ?  Years: 55.00  ?  Pack years: 55.00  ?  Types: Cigarettes  ?  Start date: 07/22/2017  ?  Quit date: 07/23/2017  ?  Years since quitting: 4.3  ? Smokeless tobacco: Never  ?Vaping Use  ? Vaping Use: Former  ?Substance and Sexual Activity  ? Alcohol use: No  ? Drug use: No  ? Sexual activity: Never  ?Other Topics Concern  ? Not on file  ?Social History Narrative  ? ** Merged History Encounter **  ?    ? ?Social Determinants of Health  ? ?Financial Resource Strain: Not on file  ?Food Insecurity: Not on file  ?Transportation Needs: Not on file  ?Physical Activity: Not on file  ?Stress: Not on file  ?Social Connections: Not on file  ?  ? ?Family History: ?The patient's family history includes Hyperlipidemia in her father and mother; Hypertension in her father and mother. ?ROS:   ?Please see the history of present illness.    ?All 14 point review of systems negative except as described per history of present illness ? ?EKGs/Labs/Other Studies Reviewed:   ? ? ? ?Recent Labs: ?No results found for requested labs within last 8760 hours.  ?Recent Lipid Panel ?   ?Component Value Date/Time  ? CHOL 169 09/18/2013 0614  ? TRIG 134 09/18/2013 0614  ? HDL 76 09/18/2013 0614  ? CHOLHDL 2.2 09/18/2013 0614  ? VLDL 27 09/18/2013 0614  ? LDLCALC 66 09/18/2013 0614  ? ? ?Physical Exam:   ? ?VS:  BP 130/72 (BP Location: Left Arm, Patient Position: Sitting)   Pulse (!) 112   Ht 5\' 1"  (1.549 m)   Wt 155 lb (70.3 kg)   SpO2 95%   BMI 29.29 kg/m?    ? ?Wt Readings from Last 3 Encounters:  ?11/26/21 155 lb (70.3 kg)  ?06/10/21 155 lb (70.3 kg)  ?05/31/21 157 lb 9.6 oz (71.5 kg)  ?  ? ?GEN:  Well nourished, well developed in no acute distress ?HEENT: Normal ?NECK: No JVD; No carotid bruits ?LYMPHATICS: No lymphadenopathy ?CARDIAC: RRR, no murmurs, no rubs, no  gallops ?RESPIRATORY:  Clear to auscultation without rales, wheezing or rhonchi  ?ABDOMEN: Soft, non-tender, non-distended ?MUSCULOSKELETAL:  No edema; No deformity  ?SKIN: Warm and dry ?LOWER EXTREMITIES: no swelling ?NEUROLOGIC:  Alert and oriented x 3 ?PSYCHIATRIC:  Normal affect  ? ?ASSESSMENT:   ? ?1. Chronic diastolic CHF (congestive heart failure) (HCC)   ?2. Primary hypertension   ?3. COPD, group D, by GOLD 2017 classification (HCC)   ?4. Bilateral carotid artery stenosis   ? ?  PLAN:   ? ?In order of problems listed above: ? ?Chronic diastolic congestive heart failure seems to be perfectly compensated.  We will continue present management. ?Essential hypertension blood pressure 10/04/1970 we will continue present management ?Dyslipidemia she is on statin which I will continue her LDL 71 HDL 80 ?Smoking she quit and still abstain from smoking for the last 2 to 3 years. ?Peripheral vascular disease.  She be scheduled to have carotic ultrasound. ? ? ?Medication Adjustments/Labs and Tests Ordered: ?Current medicines are reviewed at length with the patient today.  Concerns regarding medicines are outlined above.  ?No orders of the defined types were placed in this encounter. ? ?Medication changes: No orders of the defined types were placed in this encounter. ? ? ?Signed, ?Georgeanna Lea, MD, [facility] ?11/26/2021 4:43 PM    ?[facility] ?

## 2021-11-26 NOTE — Patient Instructions (Signed)
Medication Instructions:  °Your physician recommends that you continue on your current medications as directed. Please refer to the Current Medication list given to you today. ° °*If you need a refill on your cardiac medications before your next appointment, please call your pharmacy* ° ° °Lab Work: °None °If you have labs (blood work) drawn today and your tests are completely normal, you will receive your results only by: °MyChart Message (if you have MyChart) OR °A paper copy in the mail °If you have any lab test that is abnormal or we need to change your treatment, we will call you to review the results. ° ° °Testing/Procedures: °Your physician has requested that you have a carotid duplex. This test is an ultrasound of the carotid arteries in your neck. It looks at blood flow through these arteries that supply the brain with blood. Allow one hour for this exam. There are no restrictions or special instructions.  ° ° °Follow-Up: °At [facility], you and your health needs are our priority.  As part of our continuing mission to provide you with exceptional heart care, we have created designated Provider Care Teams.  These Care Teams include your primary Cardiologist (physician) and Advanced Practice Providers (APPs -  Physician Assistants and Nurse Practitioners) who all work together to provide you with the care you need, when you need it. ° °We recommend signing up for the patient portal called "MyChart".  Sign up information is provided on this After Visit Summary.  MyChart is used to connect with patients for Virtual Visits (Telemedicine).  Patients are able to view lab/test results, encounter notes, upcoming appointments, etc.  Non-urgent messages can be sent to your provider as well.   °To learn more about what you can do with MyChart, go to [email or web address].   ° °Your next appointment:   °6 month(s) ° °The format for your next appointment:   °In Person ° °Provider:   °Robert Krasowski, MD   ° ° °Other Instructions °None ° °

## 2021-12-07 ENCOUNTER — Ambulatory Visit: Payer: Commercial Managed Care - HMO | Admitting: Cardiology

## 2021-12-07 DIAGNOSIS — I10 Essential (primary) hypertension: Secondary | ICD-10-CM | POA: Diagnosis not present

## 2021-12-07 DIAGNOSIS — E7800 Pure hypercholesterolemia, unspecified: Secondary | ICD-10-CM | POA: Diagnosis not present

## 2021-12-07 DIAGNOSIS — I509 Heart failure, unspecified: Secondary | ICD-10-CM | POA: Diagnosis not present

## 2021-12-07 DIAGNOSIS — J449 Chronic obstructive pulmonary disease, unspecified: Secondary | ICD-10-CM | POA: Diagnosis not present

## 2021-12-14 IMAGING — CT CT CHEST LUNG CANCER SCREENING LOW DOSE W/O CM
2 of 4 series · 15 of 36 positions shown, 18 images · non-contrast
Comparison: 10/09/2019.

CLINICAL DATA: Lung cancer screening. Former asymptomatic smoker
with 54 pack-year history.

EXAM:
CT CHEST WITHOUT CONTRAST LOW-DOSE FOR LUNG CANCER SCREENING
TECHNIQUE: Multidetector CT imaging of the chest was performed following the
standard protocol without IV contrast.

[Series 3: lung thins 1.0 · axial · 0.70mm/px · z∈[-287,-5]mm · 12 of 312 slices shown, 15 images]
[im 15/312  mediastinal]
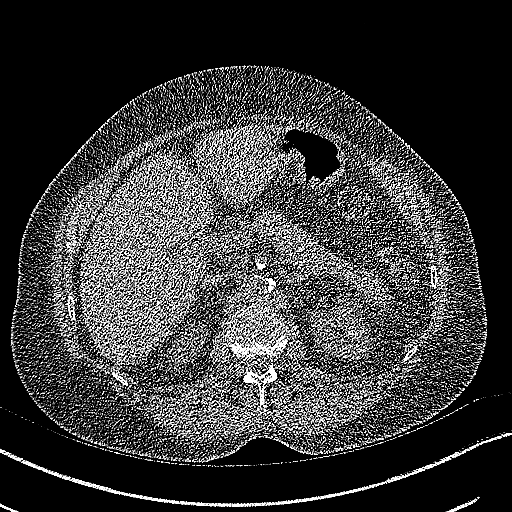
[im 15/312  lung]
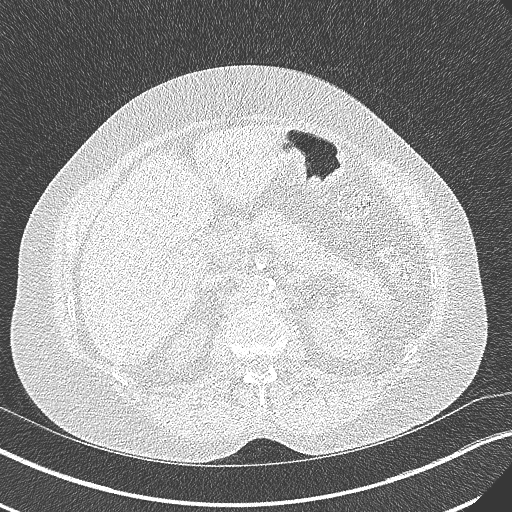
[im 43/312  lung]
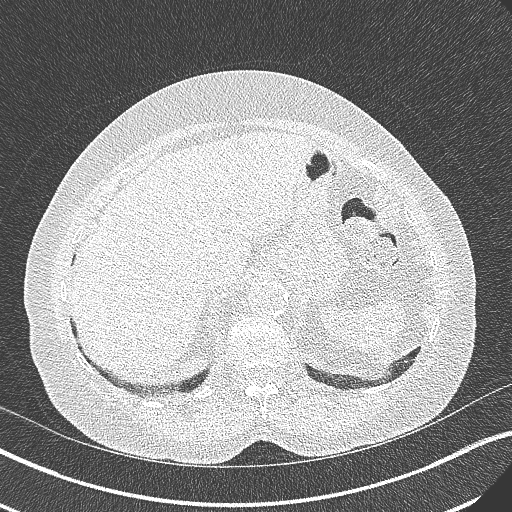
[im 71/312  lung]
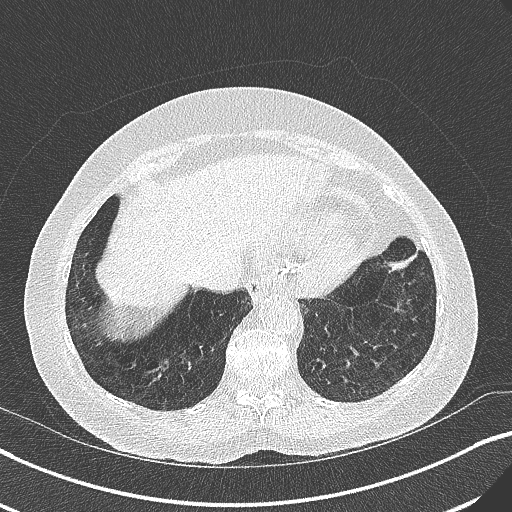
[im 99/312  lung]
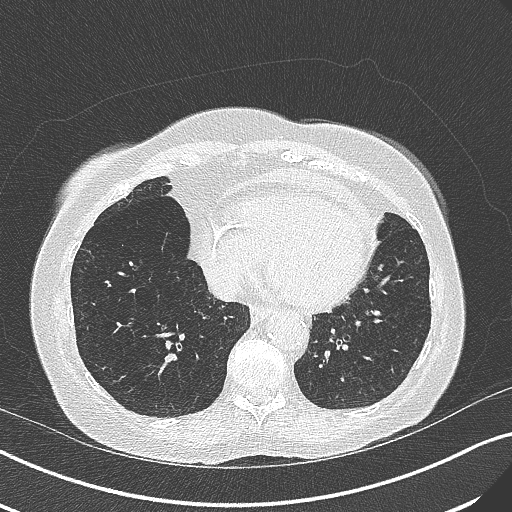
[im 114/312  mediastinal]
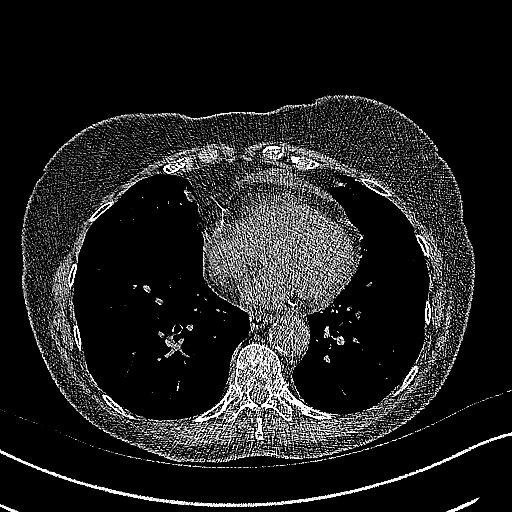
[im 114/312  lung]
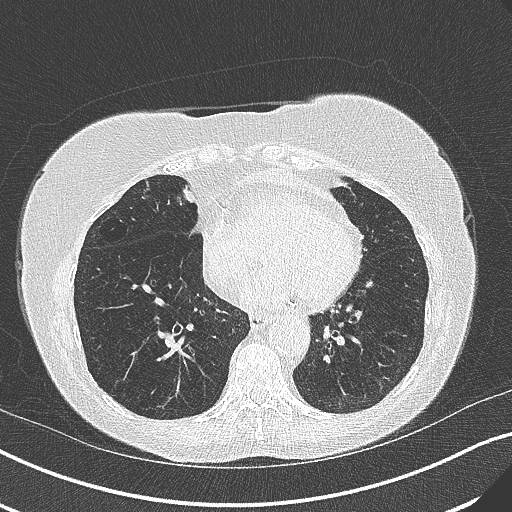
[im 142/312  lung]
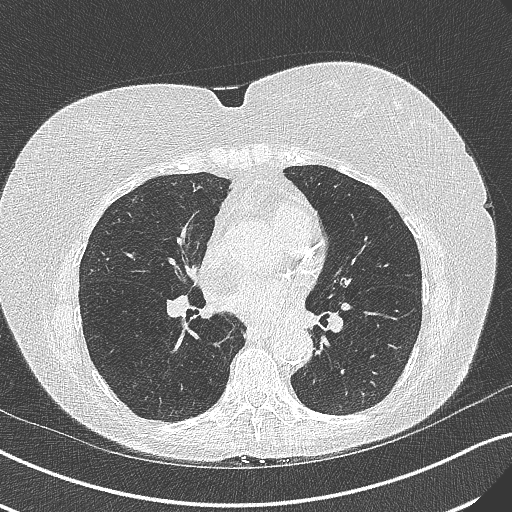
[im 170/312  lung]
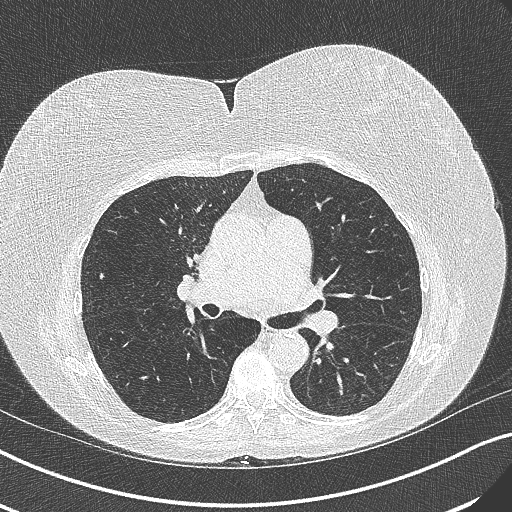
[im 198/312  lung]
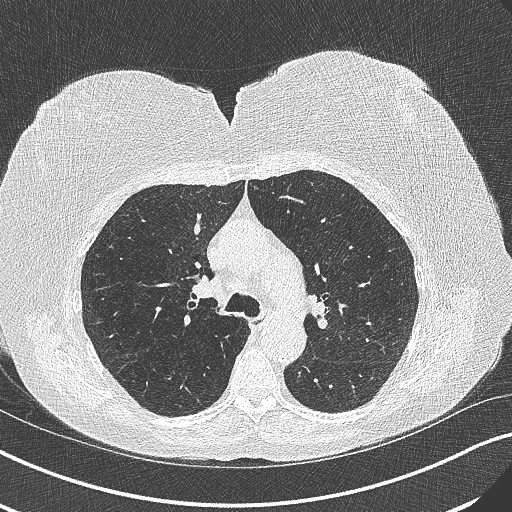
[im 213/312  mediastinal]
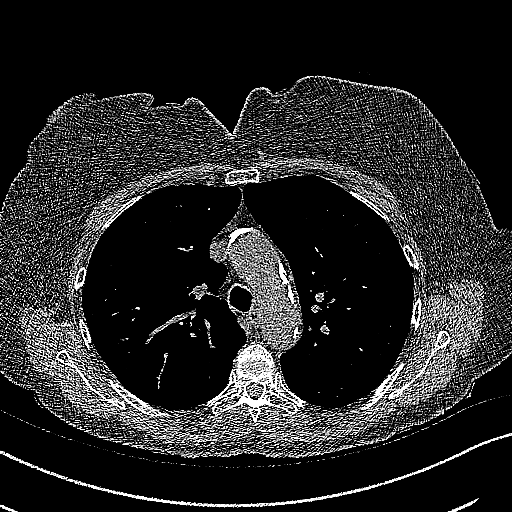
[im 213/312  lung]
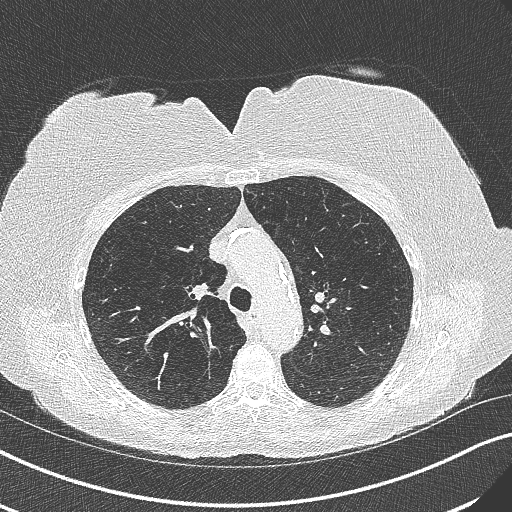
[im 241/312  lung]
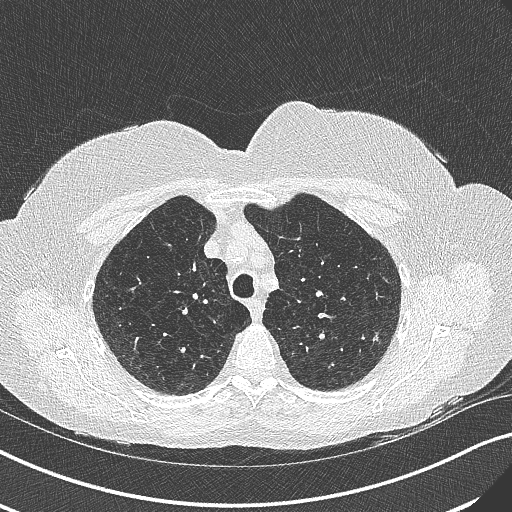
[im 269/312  lung]
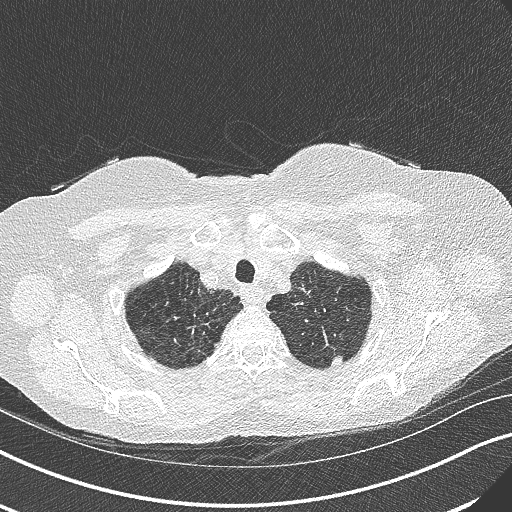
[im 297/312  lung]
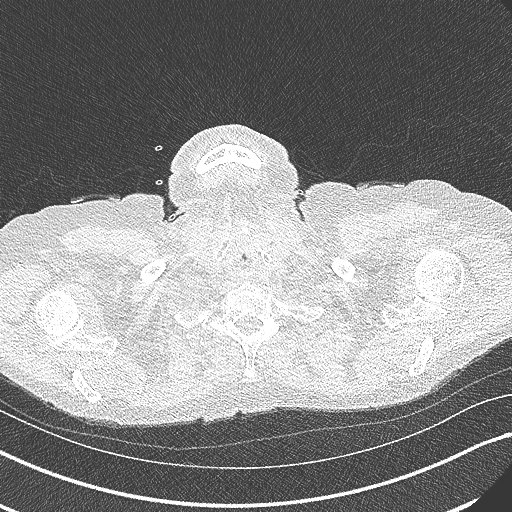

[Series 5: coronal · coronal · 0.59mm/px · 3 of 112 slices shown]
[im 23/112  lung]
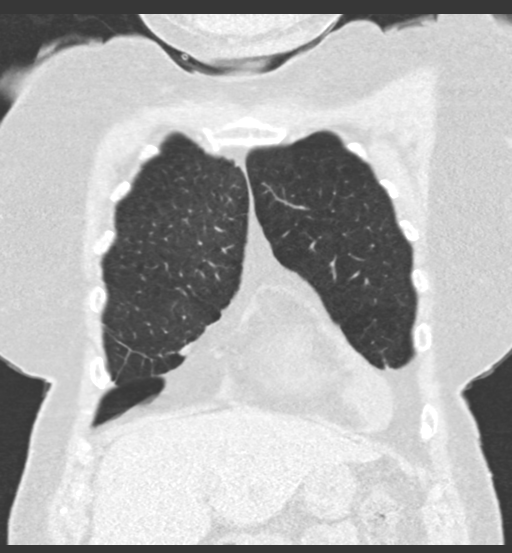
[im 45/112  lung]
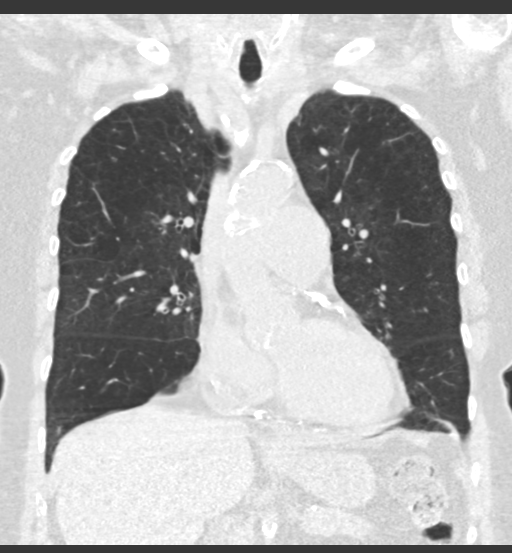
[im 67/112  lung]
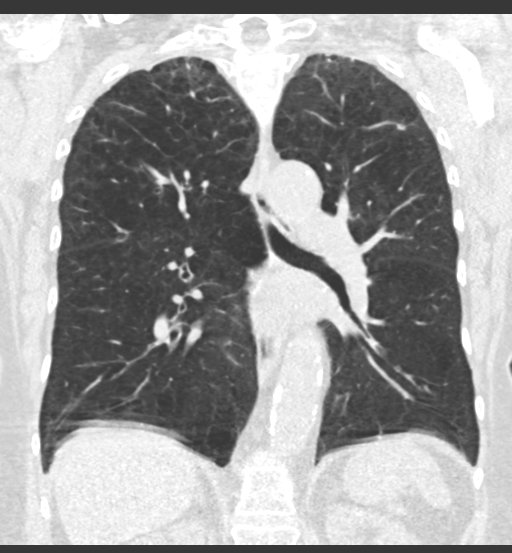

[15 of 36 positions shown; findings below may reference images not displayed]

FINDINGS: Cardiovascular: The heart size is normal. Similar appearance of
small pericardial effusion which measures 1.4 cm over the right
ventricle, image 45/2. Aortic atherosclerosis. Coronary artery
calcifications.

Mediastinum/Nodes: No enlarged mediastinal, hilar, or axillary lymph
nodes. Thyroid gland, trachea, and esophagus demonstrate no
significant findings.

Lungs/Pleura: No pleural effusion, airspace consolidation, or
atelectasis. Advanced changes of centrilobular and paraseptal
emphysema. Scattered small lung nodules are again noted measuring up
to 4.7 mm. There is a new nodule within the left upper lobe which
has a mean derived diameter of 4.8 mm.

Upper Abdomen: No acute abnormality. Unchanged low-attenuation liver
lesions compatible with benign cysts and/or hemangiomas. Aortic
atherosclerosis.

Musculoskeletal: No chest wall mass or suspicious bone lesions
identified.
IMPRESSION: 1. Lung-RADS 3, probably benign findings. Short-term follow-up in 6
months is recommended with repeat low-dose chest CT without contrast
(please use the following order, "CT CHEST LCS NODULE FOLLOW-UP W/O
CM").
2. Coronary artery calcifications
3. Stable small pericardial effusion.

Aortic Atherosclerosis ([facility]0-01B.B) and Emphysema ([facility]0-13D.5).

## 2021-12-15 ENCOUNTER — Encounter (HOSPITAL_COMMUNITY): Payer: Medicare Other

## 2021-12-17 ENCOUNTER — Ambulatory Visit (HOSPITAL_COMMUNITY)
Admission: RE | Admit: 2021-12-17 | Discharge: 2021-12-17 | Disposition: A | Discharge status: Home / Self Care | Payer: Medicare Other | Source: Ambulatory Visit | Attending: Cardiology | Admitting: Cardiology

## 2021-12-17 DIAGNOSIS — I5032 Chronic diastolic (congestive) heart failure: Secondary | ICD-10-CM | POA: Diagnosis not present

## 2021-12-17 DIAGNOSIS — I6523 Occlusion and stenosis of bilateral carotid arteries: Secondary | ICD-10-CM

## 2021-12-17 DIAGNOSIS — I10 Essential (primary) hypertension: Secondary | ICD-10-CM | POA: Diagnosis not present

## 2021-12-17 DIAGNOSIS — J449 Chronic obstructive pulmonary disease, unspecified: Secondary | ICD-10-CM

## 2021-12-17 NOTE — Progress Notes (Signed)
Carotid artery duplex completed. ?Refer to "CV Proc" under chart review to view preliminary results. ? ?12/17/2021 4:39 PM ?Eula Fried., MHA, RVT, RDCS, RDMS   ?

## 2022-01-28 DIAGNOSIS — H01009 Unspecified blepharitis unspecified eye, unspecified eyelid: Secondary | ICD-10-CM | POA: Diagnosis not present

## 2022-01-28 DIAGNOSIS — H2513 Age-related nuclear cataract, bilateral: Secondary | ICD-10-CM | POA: Diagnosis not present

## 2022-01-28 DIAGNOSIS — H25013 Cortical age-related cataract, bilateral: Secondary | ICD-10-CM | POA: Diagnosis not present

## 2022-01-28 DIAGNOSIS — H40022 Open angle with borderline findings, high risk, left eye: Secondary | ICD-10-CM | POA: Diagnosis not present

## 2022-01-28 DIAGNOSIS — H5201 Hypermetropia, right eye: Secondary | ICD-10-CM | POA: Diagnosis not present

## 2022-03-09 DIAGNOSIS — I509 Heart failure, unspecified: Secondary | ICD-10-CM | POA: Diagnosis not present

## 2022-03-09 DIAGNOSIS — E7800 Pure hypercholesterolemia, unspecified: Secondary | ICD-10-CM | POA: Diagnosis not present

## 2022-03-09 DIAGNOSIS — I10 Essential (primary) hypertension: Secondary | ICD-10-CM | POA: Diagnosis not present

## 2022-03-09 DIAGNOSIS — J449 Chronic obstructive pulmonary disease, unspecified: Secondary | ICD-10-CM | POA: Diagnosis not present

## 2022-03-29 ENCOUNTER — Other Ambulatory Visit: Payer: Self-pay | Admitting: Family Medicine

## 2022-03-29 DIAGNOSIS — Z1231 Encounter for screening mammogram for malignant neoplasm of breast: Secondary | ICD-10-CM

## 2022-03-30 ENCOUNTER — Other Ambulatory Visit: Payer: Medicare Other

## 2022-04-06 ENCOUNTER — Telehealth: Payer: Self-pay | Admitting: Pulmonary Disease

## 2022-04-18 ENCOUNTER — Ambulatory Visit
Admission: RE | Admit: 2022-04-18 | Discharge: 2022-04-18 | Disposition: A | Discharge status: Home / Self Care | Payer: Medicare Other | Source: Ambulatory Visit | Attending: Acute Care | Admitting: Acute Care

## 2022-04-18 DIAGNOSIS — R911 Solitary pulmonary nodule: Secondary | ICD-10-CM | POA: Diagnosis not present

## 2022-04-18 DIAGNOSIS — J439 Emphysema, unspecified: Secondary | ICD-10-CM | POA: Diagnosis not present

## 2022-04-18 DIAGNOSIS — I700 Atherosclerosis of aorta: Secondary | ICD-10-CM | POA: Diagnosis not present

## 2022-04-18 DIAGNOSIS — Z87891 Personal history of nicotine dependence: Secondary | ICD-10-CM

## 2022-04-18 DIAGNOSIS — I3139 Other pericardial effusion (noninflammatory): Secondary | ICD-10-CM | POA: Diagnosis not present

## 2022-04-19 DIAGNOSIS — H2512 Age-related nuclear cataract, left eye: Secondary | ICD-10-CM | POA: Diagnosis not present

## 2022-04-19 DIAGNOSIS — H25043 Posterior subcapsular polar age-related cataract, bilateral: Secondary | ICD-10-CM | POA: Diagnosis not present

## 2022-04-19 DIAGNOSIS — H2513 Age-related nuclear cataract, bilateral: Secondary | ICD-10-CM | POA: Diagnosis not present

## 2022-04-19 DIAGNOSIS — H43813 Vitreous degeneration, bilateral: Secondary | ICD-10-CM | POA: Diagnosis not present

## 2022-04-19 DIAGNOSIS — H18413 Arcus senilis, bilateral: Secondary | ICD-10-CM | POA: Diagnosis not present

## 2022-04-21 ENCOUNTER — Other Ambulatory Visit: Payer: Self-pay

## 2022-04-21 DIAGNOSIS — Z122 Encounter for screening for malignant neoplasm of respiratory organs: Secondary | ICD-10-CM

## 2022-04-21 DIAGNOSIS — Z87891 Personal history of nicotine dependence: Secondary | ICD-10-CM

## 2022-04-26 ENCOUNTER — Ambulatory Visit: Payer: Medicare Other

## 2022-04-26 DIAGNOSIS — D509 Iron deficiency anemia, unspecified: Secondary | ICD-10-CM | POA: Diagnosis not present

## 2022-04-26 DIAGNOSIS — J9611 Chronic respiratory failure with hypoxia: Secondary | ICD-10-CM | POA: Diagnosis not present

## 2022-04-26 DIAGNOSIS — E559 Vitamin D deficiency, unspecified: Secondary | ICD-10-CM | POA: Diagnosis not present

## 2022-04-26 DIAGNOSIS — E7800 Pure hypercholesterolemia, unspecified: Secondary | ICD-10-CM | POA: Diagnosis not present

## 2022-04-26 DIAGNOSIS — I10 Essential (primary) hypertension: Secondary | ICD-10-CM | POA: Diagnosis not present

## 2022-04-26 DIAGNOSIS — R202 Paresthesia of skin: Secondary | ICD-10-CM | POA: Diagnosis not present

## 2022-04-26 DIAGNOSIS — J449 Chronic obstructive pulmonary disease, unspecified: Secondary | ICD-10-CM | POA: Diagnosis not present

## 2022-04-26 DIAGNOSIS — R7303 Prediabetes: Secondary | ICD-10-CM | POA: Diagnosis not present

## 2022-04-26 DIAGNOSIS — I509 Heart failure, unspecified: Secondary | ICD-10-CM | POA: Diagnosis not present

## 2022-04-26 DIAGNOSIS — N1831 Chronic kidney disease, stage 3a: Secondary | ICD-10-CM | POA: Diagnosis not present

## 2022-04-26 DIAGNOSIS — I700 Atherosclerosis of aorta: Secondary | ICD-10-CM | POA: Diagnosis not present

## 2022-04-26 DIAGNOSIS — Z0000 Encounter for general adult medical examination without abnormal findings: Secondary | ICD-10-CM | POA: Diagnosis not present

## 2022-05-08 ENCOUNTER — Other Ambulatory Visit: Payer: Self-pay | Admitting: Cardiology

## 2022-05-13 ENCOUNTER — Encounter: Payer: Self-pay | Admitting: Pulmonary Disease

## 2022-05-13 ENCOUNTER — Ambulatory Visit (INDEPENDENT_AMBULATORY_CARE_PROVIDER_SITE_OTHER): Payer: Medicare Other | Admitting: Pulmonary Disease

## 2022-05-13 VITALS — BP 126/66 | HR 101 | Temp 98.4°F | Ht 61.0 in | Wt 159.2 lb

## 2022-05-13 DIAGNOSIS — J449 Chronic obstructive pulmonary disease, unspecified: Secondary | ICD-10-CM | POA: Diagnosis not present

## 2022-05-13 NOTE — Patient Instructions (Signed)
Please work on weight loss with diet and exercise We will send a referral to the DME company to see if they can get you on a better concentrator You can increase the DuoNebs to 2-3 times a day Continue the Symbicort and Spiriva Follow-up in 6 months.

## 2022-05-13 NOTE — Progress Notes (Signed)
Katie Waller    [id number]    10/11/1945  Primary Care Physician:Reade, Molly Maduro, MD  Referring Physician: Elias Else, MD [phone] [place],  [place] [address]  Chief complaint: Follow-up for COPD GOLD D  HPI: 76 year old with severe COPD [CAT Score 13].   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  She is placed on BiPAP, treated with steroids, antibiotics, nebulizer.  Also noted to have acute on chronic diastolic heart failure which was treated with IV Lasix.  Discharged to rehab facility.  She is since returned to home and continues with exercise regimen at home with home health.    Did not like Brovana and Pulmicort since she does not like a mask on the face Tried Trelegy in march 2019 but preferred to go back on symbicort and spiriva  Pets: Dogs.  No cats, birds, farm animals Occupation: Retired as an Print production planner.   Exposures: No known exposures, no mold, hot tub. Smoking history: 55-pack-year smoking.  Quit in November 2018 Travel History: Not significant  Interim History: Complains of worsening dyspnea on exertion over the past year. Continues on Symbicort and Spiriva Feels that her POC is not effective while she is moving around outside  Outpatient Encounter Medications as of 05/13/2022  Medication Sig   albuterol (PROVENTIL HFA;VENTOLIN HFA) 108 (90 BASE) MCG/ACT inhaler Inhale 2 puffs into the lungs every 6 (six) hours as needed for wheezing or shortness of breath.   amLODipine (NORVASC) 5 MG tablet Take 5 mg by mouth every morning.   aspirin 81 MG chewable tablet Chew 1 tablet (81 mg total) by mouth daily.   atorvastatin (LIPITOR) 10 MG tablet TAKE 1 TABLET BY MOUTH EVERY DAY   benzonatate (TESSALON) 100 MG capsule Take 100 mg by mouth 3 (three) times daily as needed for cough.    budesonide-formoterol (SYMBICORT) 160-4.5 MCG/ACT inhaler Inhale 2 puffs into the lungs 2 (two) times daily.    Calcium 600-200 MG-UNIT tablet Take 1 tablet by mouth daily.   furosemide (LASIX) 40 MG tablet Take 40 mg by mouth daily as needed for fluid or edema.    gabapentin (NEURONTIN) 300 MG capsule Take 900 mg by mouth at bedtime.   iron polysaccharides (NIFEREX) 150 MG capsule Take 150 mg by mouth daily.   LORazepam (ATIVAN) 1 MG tablet Take 1 tablet (1 mg total) by mouth every 6 (six) hours as needed for anxiety.   losartan (COZAAR) 50 MG tablet Take 50 mg by mouth daily.   raloxifene (EVISTA) 60 MG tablet Take 60 mg by mouth every morning.   tiotropium (SPIRIVA) 18 MCG inhalation capsule Place 18 mcg into inhaler and inhale daily.   traZODone (DESYREL) 100 MG tablet Take 1 tablet (100 mg total) by mouth at bedtime as needed for sleep.   [DISCONTINUED] CVS VITAMIN D3 250 MCG (10000 UT) CAPS Take 1 capsule by mouth daily.   No facility-administered encounter medications on file as of 05/13/2022.   Physical Exam: Blood pressure 126/66, pulse (!) 101, temperature 98.4 F (36.9 C), temperature source Oral, height 5\' 1"  (1.549 m), weight 159 lb 3.2 oz (72.2 kg), SpO2 98 %. Gen:      No acute distress HEENT:  EOMI, sclera anicteric Neck:     No masses; no thyromegaly Lungs:    Clear to auscultation bilaterally; normal respiratory effort CV:         Regular rate and rhythm;  no murmurs Abd:      + bowel sounds; soft, non-tender; no palpable masses, no distension Ext:    No edema; adequate peripheral perfusion Skin:      Warm and dry; no rash Neuro: alert and oriented x 3 Psych: normal mood and affect   Data Reviewed: Imaging CT chest 07/31/17-no pulmonary embolism, emphysematous changes, dependent atelectasis.  Low-density lesion in the liver.  Screening CT chest 04/18/2022-moderate emphysema, stable pulmonary nodules.  Small pericardial effusion, atherosclerosis. I have reviewed the images personally.   PFTs 10/02/17 FVC 1.6 [60%], FEV1 0.73 [36%], F/F 45, TLC 129%, 3/0 369%, DLCO 33% Severe  obstruction, bronchodilator response, hyperinflation with air trapping, severe diffusion impairment.  Labs: CBC differential 12/24/2018-WBC 11.3, eos 1%, absolute eosinophil count 113 IgE 05/06/2018-133 Alpha-1 antitrypsin 09/05/2017-178, PI MM  Cardiac Echo 11/22/2018 LVEF 55-60%, mild LVH, no pulmonary hypertension  Assessment:  COPD Gold D Complains of worsening dyspnea which I suspect is from progression of COPD Continues on Symbicort and Spiriva.  Sees Dr. Bing Matter for heart failure and he feels that she is euvolemic.  Recent CT this month reviewed with no significant interstitial lung issues  We will reassess for new portable concentrator and oxygen requirements Increase DuoNebs to 3 times a day as needed She did pulmonary rehab in 2020.  We discussed going back to rehab therapy but she wants to try exercise at home  Former smoker Screening CTs reviewed with no concerning findings.   Health maintenance 2/90/15- Prevnar 12/08/2014-Pneumovax  Plan/Recommendations: - Continue Symbicort, Spiriva - Continue supplemental oxygen - Increase DuoNebs - Weight loss with diet and exercise.  Chilton Greathouse MD [facility] 05/13/2022, 3:48 PM  CC: Elias Else, MD

## 2022-06-09 DIAGNOSIS — J449 Chronic obstructive pulmonary disease, unspecified: Secondary | ICD-10-CM | POA: Diagnosis not present

## 2022-06-09 DIAGNOSIS — E7800 Pure hypercholesterolemia, unspecified: Secondary | ICD-10-CM | POA: Diagnosis not present

## 2022-06-09 DIAGNOSIS — I10 Essential (primary) hypertension: Secondary | ICD-10-CM | POA: Diagnosis not present

## 2022-06-09 DIAGNOSIS — N1831 Chronic kidney disease, stage 3a: Secondary | ICD-10-CM | POA: Diagnosis not present

## 2022-06-09 DIAGNOSIS — E559 Vitamin D deficiency, unspecified: Secondary | ICD-10-CM | POA: Diagnosis not present

## 2022-06-09 DIAGNOSIS — G4700 Insomnia, unspecified: Secondary | ICD-10-CM | POA: Diagnosis not present

## 2022-06-09 DIAGNOSIS — D509 Iron deficiency anemia, unspecified: Secondary | ICD-10-CM | POA: Diagnosis not present

## 2022-06-10 DIAGNOSIS — H2512 Age-related nuclear cataract, left eye: Secondary | ICD-10-CM | POA: Diagnosis not present

## 2022-06-10 DIAGNOSIS — H2511 Age-related nuclear cataract, right eye: Secondary | ICD-10-CM | POA: Diagnosis not present

## 2022-06-10 DIAGNOSIS — H52222 Regular astigmatism, left eye: Secondary | ICD-10-CM | POA: Diagnosis not present

## 2022-06-10 DIAGNOSIS — H25013 Cortical age-related cataract, bilateral: Secondary | ICD-10-CM | POA: Diagnosis not present

## 2022-06-10 DIAGNOSIS — H40022 Open angle with borderline findings, high risk, left eye: Secondary | ICD-10-CM | POA: Diagnosis not present

## 2022-06-10 DIAGNOSIS — H01009 Unspecified blepharitis unspecified eye, unspecified eyelid: Secondary | ICD-10-CM | POA: Diagnosis not present

## 2022-06-10 DIAGNOSIS — H5212 Myopia, left eye: Secondary | ICD-10-CM | POA: Diagnosis not present

## 2022-07-06 DIAGNOSIS — E7800 Pure hypercholesterolemia, unspecified: Secondary | ICD-10-CM | POA: Diagnosis not present

## 2022-07-06 DIAGNOSIS — I10 Essential (primary) hypertension: Secondary | ICD-10-CM | POA: Diagnosis not present

## 2022-07-06 DIAGNOSIS — I509 Heart failure, unspecified: Secondary | ICD-10-CM | POA: Diagnosis not present

## 2022-07-06 DIAGNOSIS — J449 Chronic obstructive pulmonary disease, unspecified: Secondary | ICD-10-CM | POA: Diagnosis not present

## 2022-09-06 ENCOUNTER — Ambulatory Visit: Payer: Medicare Other | Admitting: Cardiology

## 2022-09-13 ENCOUNTER — Telehealth: Payer: Self-pay | Admitting: Pulmonary Disease

## 2022-09-13 DIAGNOSIS — I509 Heart failure, unspecified: Secondary | ICD-10-CM | POA: Diagnosis not present

## 2022-09-13 DIAGNOSIS — J449 Chronic obstructive pulmonary disease, unspecified: Secondary | ICD-10-CM | POA: Diagnosis not present

## 2022-09-13 DIAGNOSIS — I10 Essential (primary) hypertension: Secondary | ICD-10-CM | POA: Diagnosis not present

## 2022-09-13 DIAGNOSIS — E7800 Pure hypercholesterolemia, unspecified: Secondary | ICD-10-CM | POA: Diagnosis not present

## 2022-09-13 NOTE — Telephone Encounter (Signed)
[facility] calling. PT had a phone appt w/ theirPharmacist today and they were concerned that her O2 is in the 70's on exertion and wonder if she needs to be seen. Please call PT to advise at number on file.

## 2022-09-14 MED ORDER — PREDNISONE 10 MG PO TABS: 40.0000 mg | ORAL_TABLET | Freq: Every day | ORAL | 0 refills | Status: AC

## 2022-09-14 NOTE — Telephone Encounter (Signed)
Prescribe prednisone 40mg /day for 5 days Agree with antibiotic from PCP  We can get her to come in for acute visit if symptoms do not improve

## 2022-09-14 NOTE — Telephone Encounter (Signed)
Called and spoke with patient and went over order for prednisone with her. She verbalized understanding. Verified  pharmacy. Nothing further needed

## 2022-09-14 NOTE — Telephone Encounter (Signed)
Spoke with patient she states with exertion her 02 has been dropping to the 70's. She had recently seen her pcp for congestion he gave her an antibiotic, she states this has helped some with the sob. She has been monitoring her o2 and states with any walking her 02 drops.  Dr. Vaughan Browner please advise, there is a recall placed for patient to see you in march. Do you want [place] to schedule her sooner?

## 2022-09-17 DIAGNOSIS — J449 Chronic obstructive pulmonary disease, unspecified: Secondary | ICD-10-CM | POA: Diagnosis not present

## 2022-09-29 DIAGNOSIS — H2512 Age-related nuclear cataract, left eye: Secondary | ICD-10-CM | POA: Diagnosis not present

## 2022-10-14 ENCOUNTER — Ambulatory Visit: Payer: 59 | Attending: Cardiology | Admitting: Cardiology

## 2022-10-14 ENCOUNTER — Encounter: Payer: Self-pay | Admitting: Cardiology

## 2022-10-14 VITALS — BP 142/76 | HR 114 | Ht 61.0 in | Wt 159.0 lb

## 2022-10-14 DIAGNOSIS — I6523 Occlusion and stenosis of bilateral carotid arteries: Secondary | ICD-10-CM | POA: Diagnosis not present

## 2022-10-14 DIAGNOSIS — I10 Essential (primary) hypertension: Secondary | ICD-10-CM | POA: Diagnosis not present

## 2022-10-14 DIAGNOSIS — F17201 Nicotine dependence, unspecified, in remission: Secondary | ICD-10-CM | POA: Diagnosis not present

## 2022-10-14 DIAGNOSIS — I5032 Chronic diastolic (congestive) heart failure: Secondary | ICD-10-CM | POA: Diagnosis not present

## 2022-10-14 DIAGNOSIS — J449 Chronic obstructive pulmonary disease, unspecified: Secondary | ICD-10-CM

## 2022-10-14 NOTE — Progress Notes (Unsigned)
Cardiology Office Note:    Date:  10/14/2022   ID:  Katie Waller, DOB [birth year], MRN [id number]  PCP:  Maury Dus, MD (Inactive)  Cardiologist:  Jenne Campus, MD    Referring MD: Maury Dus, MD   Chief Complaint  Patient presents with   Follow-up    History of Present Illness:    Katie Waller is a 77 y.o. 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  She is coming today to months for follow-up.  Overall she seems to be doing well she is on oxygen all the time she lives sedentary lifestyle.  Her daughter who is coming with her tell me that sometimes you have swollen legs that typically happen after the weekend when she is outside salty food.  Denies have any chest pain tightness squeezing pressure burning in her chest  Past Medical History:  Diagnosis Date   Abnormal finding on EKG 09/19/2013   Acute on chronic respiratory failure with hypercapnia (HCC)    Acute respiratory failure ([place]) 06/08/2016   Anxiety 11/09/2020   Asthma    Carotid bruit 11/09/2020   Chronic diastolic CHF (congestive heart failure) ([place]) 07/28/2017   Chronic obstructive pulmonary disease, unspecified ([place]) 11/09/2020   COPD, group D, by GOLD 2017 classification ([place]) 09/17/2013   Cough 11/09/2020   Decreased estrogen level 11/09/2020   Diabetes ([place])    Edema 11/09/2020   Essential hypertension 09/18/2013   Goals of care, counseling/discussion    Hardening of the aorta (main artery of the heart) ([place]) 11/09/2020   Heart disease    Hypertension    Hypertensive heart failure ([place]) 11/09/2020   Hypoxia 11/09/2020   Insomnia 11/09/2020   Iron deficiency anemia 11/09/2020   Large liver 11/09/2020   Near syncope 09/17/2013   Osteoporosis 11/09/2020   Palliative care encounter    Prediabetes 09/19/2013   Pure hypercholesterolemia 11/09/2020   Raynaud's disease 11/09/2020    Sinus tachycardia 09/18/2013   Skin sensation disturbance 11/09/2020   Smoker 09/17/2013   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   amLODipine (NORVASC) 5 MG tablet Take 5 mg by mouth every morning.   aspirin 81 MG chewable tablet Chew 1 tablet (81 mg total) by mouth daily.   atorvastatin (LIPITOR) 10 MG tablet TAKE 1 TABLET BY MOUTH EVERY DAY (Patient taking differently: Take 10 mg by mouth daily.)   benzonatate (TESSALON) 100 MG capsule Take 100 mg by mouth 3 (three) times daily as needed for cough.    budesonide-formoterol (SYMBICORT) 160-4.5 MCG/ACT inhaler Inhale 2 puffs into the lungs 2 (two) times daily.   Calcium 600-200 MG-UNIT tablet Take 1 tablet by mouth daily.   furosemide (LASIX) 40 MG tablet Take 40 mg by mouth daily as needed for fluid or edema.    gabapentin (NEURONTIN) 300 MG capsule Take 900 mg by mouth at bedtime.   iron polysaccharides (NIFEREX) 150 MG capsule Take 150 mg by mouth daily.   LORazepam (ATIVAN) 1 MG tablet Take  1 tablet (1 mg total) by mouth every 6 (six) hours as needed for anxiety.   losartan (COZAAR) 50 MG tablet Take 50 mg by mouth daily.   raloxifene (EVISTA) 60 MG tablet Take 60 mg by mouth every morning.   tiotropium (SPIRIVA) 18 MCG inhalation capsule Place 18 mcg into inhaler and inhale daily.   traZODone (DESYREL) 100 MG tablet Take 1 tablet (100 mg total) by mouth at bedtime as needed for sleep.     Allergies:   Ace inhibitors, Diphenhydramine, Doxycycline hyclate, and Codeine   Social History   Socioeconomic History   Marital status: Divorced    Spouse  name: Not on file   Number of children: Not on file   Years of education: Not on file   Highest education level: Not on file  Occupational History   Not on file  Tobacco Use   Smoking status: Former    Packs/day: 1.00    Years: 55.00    Total pack years: 55.00    Types: Cigarettes    Start date: 07/22/2017    Quit date: 07/23/2017    Years since quitting: 5.2   Smokeless tobacco: Never  Vaping Use   Vaping Use: Former  Substance and Sexual Activity   Alcohol use: No   Drug use: No   Sexual activity: Never  Other Topics Concern   Not on file  Social History Narrative   ** Merged History Encounter **       Social Determinants of Health   Financial Resource Strain: Not on file  Food Insecurity: Not on file  Transportation Needs: Not on file  Physical Activity: Not on file  Stress: Not on file  Social Connections: Not on file     Family History: The patient's family history includes Hyperlipidemia in her father and mother; Hypertension in her father and mother. ROS:   Please see the history of present illness.    All 14 point review of systems negative except as described per history of present illness  EKGs/Labs/Other Studies Reviewed:      Recent Labs: No results found for requested labs within last 365 days.  Recent Lipid Panel    Component Value Date/Time   CHOL 169 09/18/2013 0614   TRIG 134 09/18/2013 0614   HDL 76 09/18/2013 0614   CHOLHDL 2.2 09/18/2013 0614   VLDL 27 09/18/2013 0614   LDLCALC 66 09/18/2013 0614    Physical Exam:    VS:  BP (!) 142/76 (BP Location: Left Arm, Patient Position: Sitting)   Pulse (!) 114   Ht 5' 1"$  (1.549 m)   Wt 159 lb 0.6 oz (72.1 kg)   SpO2 96%   BMI 30.05 kg/m     Wt Readings from Last 3 Encounters:  10/14/22 159 lb 0.6 oz (72.1 kg)  05/13/22 159 lb 3.2 oz (72.2 kg)  11/26/21 155 lb (70.3 kg)     GEN:  Well nourished, well developed in no acute distress HEENT: Normal NECK: No JVD; No carotid  bruits LYMPHATICS: No lymphadenopathy CARDIAC: RRR, no murmurs, no rubs, no gallops RESPIRATORY:  Clear to auscultation without rales, wheezing or rhonchi  ABDOMEN: Soft, non-tender, non-distended MUSCULOSKELETAL:  No edema; No deformity  SKIN: Warm and dry LOWER EXTREMITIES: no swelling NEUROLOGIC:  Alert and oriented x 3 PSYCHIATRIC:  Normal affect   ASSESSMENT:    1. Chronic diastolic CHF (congestive heart failure) ([place])   2. Essential hypertension   3. COPD, group D, by GOLD 2017 classification (  [place])   4. Bilateral carotid artery stenosis   5. Tobacco dependence in remission    PLAN:    In order of problems listed above:  Chronic diastolic congestive heart failure seems to be compensated continue present management. Dyslipidemia I did review her K PN which show me her LDL of 76 HDL 76.  Will continue present management. COPD advance noted stable. Bilateral carotic arterial disease stable. Will recheck carotic ultrasounds When I see her   Medication Adjustments/Labs and Tests Ordered: Current medicines are reviewed at length with the patient today.  Concerns regarding medicines are outlined above.  No orders of the defined types were placed in this encounter.  Medication changes: No orders of the defined types were placed in this encounter.   Signed, Park Liter, MD, [facility] 10/14/2022 4:47 PM    [place]

## 2022-10-14 NOTE — Progress Notes (Unsigned)
This visit was accompanied by Truddie Hidden, RN.

## 2022-10-14 NOTE — Patient Instructions (Addendum)
Medication Instructions:  Your physician recommends that you continue on your current medications as directed. Please refer to the Current Medication list given to you today.  *If you need a refill on your cardiac medications before your next appointment, please call your pharmacy*   Lab Work: None Ordered If you have labs (blood work) drawn today and your tests are completely normal, you will receive your results only by: [place] (if you have MyChart) OR A paper copy in the mail If you have any lab test that is abnormal or we need to change your treatment, we will call you to review the results.   Testing/Procedures: None Ordered   Follow-Up: At [facility], you and your health needs are our priority.  As part of our continuing mission to provide you with exceptional heart care, we have created designated Provider Care Teams.  These Care Teams include your primary Cardiologist (physician) and Advanced Practice Providers (APPs -  Physician Assistants and Nurse Practitioners) who all work together to provide you with the care you need, when you need it.  We recommend signing up for the patient portal called "MyChart".  Sign up information is provided on this After Visit Summary.  MyChart is used to connect with patients for Virtual Visits (Telemedicine).  Patients are able to view lab/test results, encounter notes, upcoming appointments, etc.  Non-urgent messages can be sent to your provider as well.   To learn more about what you can do with MyChart, go to [email or web address].    Your next appointment:   6 month(s)  The format for your next appointment:   In Person  Provider:   Jenne Campus, MD    Other Instructions This visit was accompanied by Truddie Hidden, RN.

## 2022-10-18 DIAGNOSIS — J449 Chronic obstructive pulmonary disease, unspecified: Secondary | ICD-10-CM | POA: Diagnosis not present

## 2022-11-03 DIAGNOSIS — H2511 Age-related nuclear cataract, right eye: Secondary | ICD-10-CM | POA: Diagnosis not present

## 2022-11-04 DIAGNOSIS — H2511 Age-related nuclear cataract, right eye: Secondary | ICD-10-CM | POA: Diagnosis not present

## 2022-11-05 ENCOUNTER — Other Ambulatory Visit: Payer: Self-pay | Admitting: Cardiology

## 2022-11-25 DIAGNOSIS — I509 Heart failure, unspecified: Secondary | ICD-10-CM | POA: Diagnosis not present

## 2022-11-25 DIAGNOSIS — I130 Hypertensive heart and chronic kidney disease with heart failure and stage 1 through stage 4 chronic kidney disease, or unspecified chronic kidney disease: Secondary | ICD-10-CM | POA: Diagnosis not present

## 2022-11-25 DIAGNOSIS — M810 Age-related osteoporosis without current pathological fracture: Secondary | ICD-10-CM | POA: Diagnosis not present

## 2022-11-25 DIAGNOSIS — D509 Iron deficiency anemia, unspecified: Secondary | ICD-10-CM | POA: Diagnosis not present

## 2022-11-25 DIAGNOSIS — I10 Essential (primary) hypertension: Secondary | ICD-10-CM | POA: Diagnosis not present

## 2022-11-25 DIAGNOSIS — R7303 Prediabetes: Secondary | ICD-10-CM | POA: Diagnosis not present

## 2022-11-25 DIAGNOSIS — N1831 Chronic kidney disease, stage 3a: Secondary | ICD-10-CM | POA: Diagnosis not present

## 2022-11-25 DIAGNOSIS — J9611 Chronic respiratory failure with hypoxia: Secondary | ICD-10-CM | POA: Diagnosis not present

## 2022-11-25 DIAGNOSIS — E559 Vitamin D deficiency, unspecified: Secondary | ICD-10-CM | POA: Diagnosis not present

## 2022-11-25 DIAGNOSIS — I700 Atherosclerosis of aorta: Secondary | ICD-10-CM | POA: Diagnosis not present

## 2022-11-25 DIAGNOSIS — Z23 Encounter for immunization: Secondary | ICD-10-CM | POA: Diagnosis not present

## 2022-11-25 DIAGNOSIS — J449 Chronic obstructive pulmonary disease, unspecified: Secondary | ICD-10-CM | POA: Diagnosis not present

## 2022-11-28 ENCOUNTER — Other Ambulatory Visit: Payer: Self-pay | Admitting: Family Medicine

## 2022-11-28 DIAGNOSIS — M810 Age-related osteoporosis without current pathological fracture: Secondary | ICD-10-CM

## 2022-11-28 DIAGNOSIS — R0989 Other specified symptoms and signs involving the circulatory and respiratory systems: Secondary | ICD-10-CM

## 2022-11-30 ENCOUNTER — Other Ambulatory Visit: Payer: Self-pay | Admitting: Family Medicine

## 2022-11-30 DIAGNOSIS — Z1231 Encounter for screening mammogram for malignant neoplasm of breast: Secondary | ICD-10-CM

## 2022-12-01 ENCOUNTER — Telehealth: Payer: Self-pay | Admitting: Cardiology

## 2022-12-01 NOTE — Telephone Encounter (Signed)
New Message:     Patient would like to talk to Dr Agustin Cree please.  She said since he patient last seen Dr Agustin Cree, she has a hard knot on her neck. She is very concerned about this.

## 2022-12-01 NOTE — Telephone Encounter (Signed)
Pts daughter Mrs Hassell Done- per [facility]- called stating that the pt saw her PCP last Friday. She has a visible knot that has come up one her neck and the PCP says its the carotid artery. She has a carotid US ordered by the PCP on 12-23-22. She continues to have H/A, fatigue and some memory issues. She is wanting to know if you think her [place] should be moved up sooner?

## 2022-12-02 NOTE — Telephone Encounter (Signed)
Spoke with daughter- per DPR- per Dr. Wendy Poet note. She verbalized understanding and had no further questions.

## 2022-12-05 DIAGNOSIS — J449 Chronic obstructive pulmonary disease, unspecified: Secondary | ICD-10-CM | POA: Diagnosis not present

## 2022-12-23 ENCOUNTER — Ambulatory Visit
Admission: RE | Admit: 2022-12-23 | Discharge: 2022-12-23 | Disposition: A | Discharge status: Home / Self Care | Payer: 59 | Source: Ambulatory Visit | Attending: Family Medicine | Admitting: Family Medicine

## 2022-12-23 DIAGNOSIS — I771 Stricture of artery: Secondary | ICD-10-CM | POA: Diagnosis not present

## 2022-12-23 DIAGNOSIS — R0989 Other specified symptoms and signs involving the circulatory and respiratory systems: Secondary | ICD-10-CM

## 2022-12-23 DIAGNOSIS — I6523 Occlusion and stenosis of bilateral carotid arteries: Secondary | ICD-10-CM | POA: Diagnosis not present

## 2022-12-23 DIAGNOSIS — I6502 Occlusion and stenosis of left vertebral artery: Secondary | ICD-10-CM | POA: Diagnosis not present

## 2022-12-27 ENCOUNTER — Telehealth: Payer: Self-pay

## 2022-12-27 NOTE — Telephone Encounter (Signed)
Spoke with daughter per DPR. She wanted to see if Dr. Bing Matter would look at recent carotid US that PCP ordered and see if there is anything he needs to address. The lump is still on her neck and she has some issues with swallowing and coughing. Her PCP retired and her New PCP has been on leave.

## 2022-12-27 NOTE — Telephone Encounter (Signed)
Spoke with pts daughter regarding [place] that was ordered by PCP. Advised per Dr. Bing Matter to follow up with PCP to be referred for evaluation of neck lump.

## 2022-12-27 NOTE — Telephone Encounter (Signed)
Daughter is calling back because the knot is still there and want to see if patient can go ahead and get schedule. But there is no order for the procedure. Please advise

## 2022-12-29 DIAGNOSIS — I10 Essential (primary) hypertension: Secondary | ICD-10-CM | POA: Diagnosis not present

## 2022-12-29 DIAGNOSIS — J449 Chronic obstructive pulmonary disease, unspecified: Secondary | ICD-10-CM | POA: Diagnosis not present

## 2022-12-29 DIAGNOSIS — I509 Heart failure, unspecified: Secondary | ICD-10-CM | POA: Diagnosis not present

## 2022-12-29 DIAGNOSIS — D509 Iron deficiency anemia, unspecified: Secondary | ICD-10-CM | POA: Diagnosis not present

## 2022-12-29 DIAGNOSIS — E7800 Pure hypercholesterolemia, unspecified: Secondary | ICD-10-CM | POA: Diagnosis not present

## 2022-12-29 NOTE — Telephone Encounter (Signed)
Spoke with pts daughter- per DPR- per Dr. Vanetta Shawl note and advised to follow up with PCP for lump on neck. Also reviewed results of carotid US. Daughter had no questions and verbalized understanding.

## 2023-01-16 DIAGNOSIS — J449 Chronic obstructive pulmonary disease, unspecified: Secondary | ICD-10-CM | POA: Diagnosis not present

## 2023-02-16 DIAGNOSIS — J449 Chronic obstructive pulmonary disease, unspecified: Secondary | ICD-10-CM | POA: Diagnosis not present

## 2023-03-15 ENCOUNTER — Ambulatory Visit: Payer: 59 | Admitting: Pulmonary Disease

## 2023-03-18 DIAGNOSIS — J449 Chronic obstructive pulmonary disease, unspecified: Secondary | ICD-10-CM | POA: Diagnosis not present

## 2023-03-22 ENCOUNTER — Ambulatory Visit (INDEPENDENT_AMBULATORY_CARE_PROVIDER_SITE_OTHER): Payer: 59 | Admitting: Pulmonary Disease

## 2023-03-22 ENCOUNTER — Encounter: Payer: Self-pay | Admitting: Pulmonary Disease

## 2023-03-22 VITALS — BP 118/68 | HR 120 | Ht 61.0 in | Wt 158.8 lb

## 2023-03-22 DIAGNOSIS — J9600 Acute respiratory failure, unspecified whether with hypoxia or hypercapnia: Secondary | ICD-10-CM | POA: Diagnosis not present

## 2023-03-22 DIAGNOSIS — J441 Chronic obstructive pulmonary disease with (acute) exacerbation: Secondary | ICD-10-CM | POA: Diagnosis not present

## 2023-03-22 DIAGNOSIS — R0902 Hypoxemia: Secondary | ICD-10-CM | POA: Diagnosis not present

## 2023-03-22 DIAGNOSIS — J449 Chronic obstructive pulmonary disease, unspecified: Secondary | ICD-10-CM

## 2023-03-22 MED ORDER — PREDNISONE 20 MG PO TABS: 40.0000 mg | ORAL_TABLET | Freq: Every day | ORAL | 0 refills | Status: DC

## 2023-03-22 NOTE — Patient Instructions (Signed)
Sorry you are not feeling well Will give prednisone 40 mg a day for 5 days Give you a flutter wall.  Use Mucinex for mucociliary clearance Continue the inhalers Follow-up in 6 months

## 2023-03-22 NOTE — Progress Notes (Signed)
Katie Waller    [id number]    02-09-1946  Primary Care Physician:Reade, Molly Maduro, MD (Inactive)  Referring Physician: No referring provider defined for this encounter.  Chief complaint: Follow-up for COPD GOLD D  HPI: 77 y.o.  with severe COPD [CAT Score 13].   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  She is placed on BiPAP, treated with steroids, antibiotics, nebulizer.  Also noted to have acute on chronic diastolic heart failure which was treated with IV Lasix.  Discharged to rehab facility.  She is since returned to home and continues with exercise regimen at home with home health.    Did not like Brovana and Pulmicort since she does not like a mask on the face Tried Trelegy in march 2019 but preferred to go back on symbicort and spiriva  Pets: Dogs.  No cats, birds, farm animals Occupation: Retired as an Print production planner.   Exposures: No known exposures, no mold, hot tub. Smoking history: 55-pack-year smoking.  Quit in November 2018 Travel History: Not significant  Interim History: Complains of worsening dyspnea on exertion over the past year. Continues on Symbicort and Spiriva Feels that her POC is not effective while she is moving around outside  Outpatient Encounter Medications as of 03/22/2023  Medication Sig   albuterol (PROVENTIL HFA;VENTOLIN HFA) 108 (90 BASE) MCG/ACT inhaler Inhale 2 puffs into the lungs every 6 (six) hours as needed for wheezing or shortness of breath.   amLODipine (NORVASC) 5 MG tablet Take 5 mg by mouth every morning.   aspirin 81 MG chewable tablet Chew 1 tablet (81 mg total) by mouth daily.   atorvastatin (LIPITOR) 10 MG tablet TAKE 1 TABLET BY MOUTH EVERY DAY   benzonatate (TESSALON) 100 MG capsule Take 100 mg by mouth 3 (three) times daily as needed for cough.    budesonide-formoterol (SYMBICORT) 160-4.5 MCG/ACT inhaler Inhale 2 puffs into the lungs 2 (two) times daily.   Calcium  600-200 MG-UNIT tablet Take 1 tablet by mouth daily.   furosemide (LASIX) 40 MG tablet Take 40 mg by mouth daily as needed for fluid or edema.    gabapentin (NEURONTIN) 300 MG capsule Take 900 mg by mouth at bedtime.   iron polysaccharides (NIFEREX) 150 MG capsule Take 150 mg by mouth daily.   LORazepam (ATIVAN) 1 MG tablet Take 1 tablet (1 mg total) by mouth every 6 (six) hours as needed for anxiety.   losartan (COZAAR) 50 MG tablet Take 50 mg by mouth daily.   raloxifene (EVISTA) 60 MG tablet Take 60 mg by mouth every morning.   tiotropium (SPIRIVA) 18 MCG inhalation capsule Place 18 mcg into inhaler and inhale daily.   traZODone (DESYREL) 100 MG tablet Take 1 tablet (100 mg total) by mouth at bedtime as needed for sleep.   No facility-administered encounter medications on file as of 03/22/2023.   Physical Exam: Blood pressure 126/66, pulse (!) 101, temperature 98.4 F (36.9 C), temperature source Oral, height 5\' 1"  (1.549 m), weight 159 lb 3.2 oz (72.2 kg), SpO2 98 %. Gen:      No acute distress HEENT:  EOMI, sclera anicteric Neck:     No masses; no thyromegaly Lungs:    Clear to auscultation bilaterally; normal respiratory effort CV:         Regular rate and rhythm; no murmurs Abd:      + bowel sounds; soft, non-tender; no palpable masses, no distension Ext:  No edema; adequate peripheral perfusion Skin:      Warm and dry; no rash Neuro: alert and oriented x 3 Psych: normal mood and affect   Data Reviewed: Imaging CT chest 07/31/17-no pulmonary embolism, emphysematous changes, dependent atelectasis.  Low-density lesion in the liver.  Screening CT chest 04/18/2022-moderate emphysema, stable pulmonary nodules.  Small pericardial effusion, atherosclerosis. I have reviewed the images personally.   PFTs 10/02/17 FVC 1.6 [60%], FEV1 0.73 [36%], F/F 45, TLC 129%, 3/0 369%, DLCO 33% Severe obstruction, bronchodilator response, hyperinflation with air trapping, severe diffusion  impairment.  Labs: CBC differential 12/24/2018-WBC 11.3, eos 1%, absolute eosinophil count 113 IgE 05/06/2018-133 Alpha-1 antitrypsin 09/05/2017-178, PI MM  Cardiac Echo 11/22/2018 LVEF 55-60%, mild LVH, no pulmonary hypertension  Assessment:  COPD Gold D Complains of worsening dyspnea which I suspect is from progression of COPD Continues on Symbicort and Spiriva.  Sees Dr. Bing Matter for heart failure and he feels that she is euvolemic.    Pred taper for 5 days and flutter valve for mucociliary clearance. She did pulmonary rehab in 2020.  We discussed going back to rehab therapy but she wants to try exercise at home  Former smoker Screening CTs reviewed with no concerning findings.   Health maintenance 2/90/15- Prevnar 12/08/2014-Pneumovax  Plan/Recommendations: - Continue Symbicort, Spiriva - Prednisone 40mg /day for 5 days - Flutter valve - Continue supplemental oxygen - Increase DuoNebs - Weight loss with diet and exercise.  Chilton Greathouse MD [facility] 03/22/2023, 3:55 PM  CC: No ref. provider found

## 2023-03-31 DIAGNOSIS — D509 Iron deficiency anemia, unspecified: Secondary | ICD-10-CM | POA: Diagnosis not present

## 2023-03-31 DIAGNOSIS — I10 Essential (primary) hypertension: Secondary | ICD-10-CM | POA: Diagnosis not present

## 2023-03-31 DIAGNOSIS — I509 Heart failure, unspecified: Secondary | ICD-10-CM | POA: Diagnosis not present

## 2023-03-31 DIAGNOSIS — J449 Chronic obstructive pulmonary disease, unspecified: Secondary | ICD-10-CM | POA: Diagnosis not present

## 2023-03-31 DIAGNOSIS — N1831 Chronic kidney disease, stage 3a: Secondary | ICD-10-CM | POA: Diagnosis not present

## 2023-03-31 DIAGNOSIS — E7800 Pure hypercholesterolemia, unspecified: Secondary | ICD-10-CM | POA: Diagnosis not present

## 2023-04-18 DIAGNOSIS — J449 Chronic obstructive pulmonary disease, unspecified: Secondary | ICD-10-CM | POA: Diagnosis not present

## 2023-04-21 ENCOUNTER — Ambulatory Visit
Admission: RE | Admit: 2023-04-21 | Discharge: 2023-04-21 | Disposition: A | Discharge status: Home / Self Care | Payer: 59 | Source: Ambulatory Visit | Attending: Acute Care | Admitting: Acute Care

## 2023-04-21 DIAGNOSIS — Z122 Encounter for screening for malignant neoplasm of respiratory organs: Secondary | ICD-10-CM

## 2023-04-21 DIAGNOSIS — Z87891 Personal history of nicotine dependence: Secondary | ICD-10-CM

## 2023-05-01 ENCOUNTER — Telehealth: Payer: Self-pay | Admitting: Acute Care

## 2023-05-01 NOTE — Telephone Encounter (Signed)
Received call report from [facility] with [place] radiology on pt's recent LDCT. Impression is below.    IMPRESSION: 1. Lung-RADS 4B, suspicious. Additional imaging evaluation or consultation with Pulmonology or Thoracic Surgery recommended. 2. Diffuse bronchial wall thickening with emphysema, as above; imaging findings suggestive of underlying COPD. Emphysema (ICD10-J43.9). 3. Aortic Atherosclerosis (ICD10-I70.0) and Emphysema (ICD10-J43.9).   These results will be called to the ordering clinician or representative by the Radiologist Assistant, and communication documented in the PACS or [employer].     Electronically Signed   By: Signa Kell M.D.   On: 04/30/2023 15:36

## 2023-05-03 ENCOUNTER — Telehealth: Payer: Self-pay | Admitting: Acute Care

## 2023-05-03 DIAGNOSIS — R911 Solitary pulmonary nodule: Secondary | ICD-10-CM

## 2023-05-03 NOTE — Telephone Encounter (Signed)
I have called the patent with the results of her low dose lung cancer screening scan.  Patient scan was read as a lung RADS 4B, suspicious.  The nodule within the superior segment of the left lower lobe that had previously measured 2 mm is now measuring 14.9 mm.  This was given a lung RADS 4B designation. Additionally there is a new nodule in the posterior right upper lung that abuts the major fissure which has a mean derived diameter of 11.6 mm. We discussed that we need to take a closer look at both of these nodules.  I told the patient that I was going to order a PET scan to take a better look at these nodules and that we will then have her come into the office to review the results with either myself or Dr. Tonia Brooms.  Based on the results of the PET scan we will determine whether or not a biopsy is recommended. Patient is in agreement with the above plan.  She had no further questions at completion of the call.  PET scan has been ordered, please schedule once we know the date of the PET scan for the follow-up office visit. Thank you so much

## 2023-05-05 DIAGNOSIS — Z0000 Encounter for general adult medical examination without abnormal findings: Secondary | ICD-10-CM | POA: Diagnosis not present

## 2023-05-05 DIAGNOSIS — D509 Iron deficiency anemia, unspecified: Secondary | ICD-10-CM | POA: Diagnosis not present

## 2023-05-05 DIAGNOSIS — G4700 Insomnia, unspecified: Secondary | ICD-10-CM | POA: Diagnosis not present

## 2023-05-05 DIAGNOSIS — I509 Heart failure, unspecified: Secondary | ICD-10-CM | POA: Diagnosis not present

## 2023-05-05 DIAGNOSIS — E7800 Pure hypercholesterolemia, unspecified: Secondary | ICD-10-CM | POA: Diagnosis not present

## 2023-05-05 DIAGNOSIS — I110 Hypertensive heart disease with heart failure: Secondary | ICD-10-CM | POA: Diagnosis not present

## 2023-05-05 DIAGNOSIS — I700 Atherosclerosis of aorta: Secondary | ICD-10-CM | POA: Diagnosis not present

## 2023-05-05 DIAGNOSIS — J449 Chronic obstructive pulmonary disease, unspecified: Secondary | ICD-10-CM | POA: Diagnosis not present

## 2023-05-05 DIAGNOSIS — R7303 Prediabetes: Secondary | ICD-10-CM | POA: Diagnosis not present

## 2023-05-05 DIAGNOSIS — M810 Age-related osteoporosis without current pathological fracture: Secondary | ICD-10-CM | POA: Diagnosis not present

## 2023-05-15 ENCOUNTER — Encounter (HOSPITAL_COMMUNITY)
Admission: RE | Admit: 2023-05-15 | Discharge: 2023-05-15 | Disposition: A | Discharge status: Home / Self Care | Payer: 59 | Source: Ambulatory Visit | Attending: Acute Care | Admitting: Acute Care

## 2023-05-15 ENCOUNTER — Telehealth: Payer: Self-pay

## 2023-05-15 DIAGNOSIS — R911 Solitary pulmonary nodule: Secondary | ICD-10-CM | POA: Insufficient documentation

## 2023-05-15 DIAGNOSIS — R918 Other nonspecific abnormal finding of lung field: Secondary | ICD-10-CM | POA: Diagnosis not present

## 2023-05-15 LAB — GLUCOSE, CAPILLARY: Glucose-Capillary: 95 mg/dL (ref 70–99)

## 2023-05-15 MED ORDER — FLUDEOXYGLUCOSE F - 18 (FDG) INJECTION
7.8000 | Freq: Once | INTRAVENOUS | Status: AC
  Administered 2023-05-15: 7.8 via INTRAVENOUS

## 2023-05-15 NOTE — Telephone Encounter (Signed)
Called and spoke with pt and I asked if she can come in for her appt earlier then 3:30 PM and pt stated it will be up to daughter. I spoke to the daughter as well and she stated she couldn't make it earlier due to work sch. and I informed her that if she can get here at anytime before 3:30 that will be fine. Pt and daughter verbalized understanding. Nothing further needed.

## 2023-05-17 NOTE — H&P (View-Only) (Signed)
Synopsis: Referred in September 2024 for pulmonary nodule by [name], MD  Subjective:   PATIENT ID: Katie Waller GENDER: female DOB: [birth year], MRN: [id number]  Chief Complaint  Patient presents with   Follow-up    F/up on PET    This is a 77 year old female, past medical history of chronic diastolic heart failure, hypertension, chronic hypoxemic respiratory failure baseline 4 L nasal cannula.  She is a former smoker.  She had lung cancer screening CT last year and a follow-up CT scan this year that shows a new left lower lobe nodule.  Nuclear medicine pet image was obtained which shows hypermetabolic uptake concerning for primary malignancy.    Past Medical History:  Diagnosis Date   Abnormal finding on EKG 09/19/2013   Acute on chronic respiratory failure with hypercapnia (HCC)    Acute respiratory failure (HCC) 06/08/2016   Anxiety 11/09/2020   Asthma    Carotid bruit 11/09/2020   Chronic diastolic CHF (congestive heart failure) (HCC) 07/28/2017   Chronic obstructive pulmonary disease, unspecified (HCC) 11/09/2020   COPD, group D, by GOLD 2017 classification (HCC) 09/17/2013   Cough 11/09/2020   Decreased estrogen level 11/09/2020   Diabetes (HCC)    Edema 11/09/2020   Essential hypertension 09/18/2013   Goals of care, counseling/discussion    Hardening of the aorta (main artery of the heart) (HCC) 11/09/2020   Heart disease    Hypertension    Hypertensive heart failure (HCC) 11/09/2020   Hypoxia 11/09/2020   Insomnia 11/09/2020   Iron deficiency anemia 11/09/2020   Large liver 11/09/2020   Near syncope 09/17/2013   Osteoporosis 11/09/2020   Palliative care encounter    Prediabetes 09/19/2013   Pure hypercholesterolemia 11/09/2020   Raynaud's disease 11/09/2020   Sinus tachycardia 09/18/2013   Skin sensation disturbance 11/09/2020   Smoker 09/17/2013   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   Transient ischemic attack 11/09/2020   Venous stasis of both lower extremities 06/13/2016      Family History  Problem Relation Age of Onset   Hyperlipidemia Mother    Hypertension Mother    Hypertension Father    Hyperlipidemia Father      Past Surgical History:  Procedure Laterality Date   KNEE SURGERY Left     Social History   Socioeconomic History   Marital status: Divorced    Spouse name: Not on file   Number of children: Not on file   Years of education: Not on file   Highest education level: Not on file  Occupational History   Not on file  Tobacco Use   Smoking status: Former    Current packs/day: 0.00    Average packs/day: 1 pack/day for 55.0 years (55.0 ttl pk-yrs)    Types: Cigarettes    Start date: 07/22/2017    Quit date: 07/23/2017    Years since quitting: 5.8   Smokeless tobacco: Never  Vaping Use   Vaping status: Former  Substance and Sexual Activity   Alcohol use: No   Drug use: No   Sexual activity: Never  Other Topics Concern   Not on file  Social History Narrative   ** Merged History Encounter **       Social Determinants of Health   Financial Resource Strain: Not on file  Food Insecurity: Not on file  Transportation Needs: Not on file  Physical Activity: Not on file  Stress: Not on file  Social Connections: Not on file  Intimate Partner Violence: Not on file  Allergies  Allergen Reactions   Ace Inhibitors Swelling   Diphenhydramine Other (See Comments)   Doxycycline Hyclate Other (See Comments)   Codeine Nausea And Vomiting     Outpatient Medications Prior to Visit  Medication Sig Dispense Refill   albuterol (PROVENTIL HFA;VENTOLIN HFA) 108 (90 BASE) MCG/ACT inhaler Inhale 2 puffs into the lungs every 6 (six) hours as needed for wheezing or shortness of breath. 1 Inhaler 0   amLODipine (NORVASC) 5 MG tablet Take 5 mg by mouth every morning.     aspirin 81 MG chewable tablet Chew 1 tablet (81 mg total) by mouth daily.     atorvastatin (LIPITOR) 10 MG tablet TAKE 1 TABLET BY MOUTH EVERY DAY (Patient taking  differently: Take 10 mg by mouth daily.) 90 tablet 1   benzonatate (TESSALON) 100 MG capsule Take 100 mg by mouth 3 (three) times daily as needed for cough.      budesonide-formoterol (SYMBICORT) 160-4.5 MCG/ACT inhaler Inhale 2 puffs into the lungs 2 (two) times daily. 1 Inhaler 4   Calcium 600-200 MG-UNIT tablet Take 1 tablet by mouth daily.     furosemide (LASIX) 40 MG tablet Take 40 mg by mouth daily as needed for fluid or edema.      gabapentin (NEURONTIN) 300 MG capsule Take 900 mg by mouth at bedtime.     iron polysaccharides (NIFEREX) 150 MG capsule Take 150 mg by mouth daily.     LORazepam (ATIVAN) 1 MG tablet Take 1 tablet (1 mg total) by mouth every 6 (six) hours as needed for anxiety. 30 tablet 0   losartan (COZAAR) 50 MG tablet Take 50 mg by mouth daily.     predniSONE (DELTASONE) 20 MG tablet Take 2 tablets (40 mg total) by mouth daily with breakfast. 10 tablet 0   raloxifene (EVISTA) 60 MG tablet Take 60 mg by mouth every morning.     tiotropium (SPIRIVA) 18 MCG inhalation capsule Place 18 mcg into inhaler and inhale daily.     traZODone (DESYREL) 100 MG tablet Take 1 tablet (100 mg total) by mouth at bedtime as needed for sleep. 10 tablet 0   No facility-administered medications prior to visit.    Review of Systems  Constitutional:  Negative for chills, fever, malaise/fatigue and weight loss.  HENT:  Negative for hearing loss, sore throat and tinnitus.   Eyes:  Negative for blurred vision and double vision.  Respiratory:  Positive for shortness of breath. Negative for cough, hemoptysis, sputum production, wheezing and stridor.   Cardiovascular:  Negative for chest pain, palpitations, orthopnea, leg swelling and PND.  Gastrointestinal:  Negative for abdominal pain, constipation, diarrhea, heartburn, nausea and vomiting.  Genitourinary:  Negative for dysuria, hematuria and urgency.  Musculoskeletal:  Negative for joint pain and myalgias.  Skin:  Negative for itching and rash.   Neurological:  Negative for dizziness, tingling, weakness and headaches.  Endo/Heme/Allergies:  Negative for environmental allergies. Does not bruise/bleed easily.  Psychiatric/Behavioral:  Negative for depression. The patient is not nervous/anxious and does not have insomnia.   All other systems reviewed and are negative.    Objective:  Physical Exam Vitals reviewed.  Constitutional:      General: She is not in acute distress.    Appearance: She is well-developed. She is obese.  HENT:     Head: Normocephalic and atraumatic.  Eyes:     General: No scleral icterus.    Conjunctiva/sclera: Conjunctivae normal.     Pupils: Pupils are equal, round, and reactive  to light.  Neck:     Vascular: No JVD.     Trachea: No tracheal deviation.  Cardiovascular:     Rate and Rhythm: Normal rate and regular rhythm.     Heart sounds: Normal heart sounds. No murmur heard. Pulmonary:     Effort: Pulmonary effort is normal. No tachypnea, accessory muscle usage or respiratory distress.     Breath sounds: No stridor. No wheezing, rhonchi or rales.     Comments: Severely diminished breath sounds bilaterally Abdominal:     General: Bowel sounds are normal. There is no distension.     Palpations: Abdomen is soft.     Tenderness: There is no abdominal tenderness.  Musculoskeletal:        General: No tenderness.     Cervical back: Neck supple.  Lymphadenopathy:     Cervical: No cervical adenopathy.  Skin:    General: Skin is warm and dry.     Capillary Refill: Capillary refill takes less than 2 seconds.     Findings: No rash.  Neurological:     Mental Status: She is alert and oriented to person, place, and time.  Psychiatric:        Behavior: Behavior normal.      Vitals:   05/18/23 0912  BP: 100/70  Pulse: (!) 105  SpO2: 94%  Weight: 158 lb 12.8 oz (72 kg)  Height: 5\' 1"  (1.549 m)   94% on RA BMI Readings from Last 3 Encounters:  05/18/23 30.00 kg/m  03/22/23 30.00 kg/m   10/14/22 30.05 kg/m   Wt Readings from Last 3 Encounters:  05/18/23 158 lb 12.8 oz (72 kg)  03/22/23 158 lb 12.8 oz (72 kg)  10/14/22 159 lb 0.6 oz (72.1 kg)     CBC    Component Value Date/Time   WBC 11.3 (H) 12/24/2018 1801   RBC 4.18 12/24/2018 1801   HGB 11.5 (L) 12/24/2018 1801   HCT 38.2 12/24/2018 1801   PLT 418 (H) 12/24/2018 1801   MCV 91.4 12/24/2018 1801   MCH 27.5 12/24/2018 1801   MCHC 30.1 12/24/2018 1801   RDW 13.9 12/24/2018 1801   LYMPHSABS 1.7 12/24/2018 1801   MONOABS 0.8 12/24/2018 1801   EOSABS 0.1 12/24/2018 1801   BASOSABS 0.1 12/24/2018 1801     Chest Imaging:  Nuclear medicine PET August 2024: Hypermetabolic uptake within a left lower lobe pulmonary nodule. The patient's images have been independently reviewed by me.    Pulmonary Functions Testing Results:    Latest Ref Rng & Units 10/02/2017    9:03 AM  PFT Results  FVC-Pre L 1.33   FVC-Predicted Pre % 49   FVC-Post L 1.62   FVC-Predicted Post % 60   Pre FEV1/FVC % % 42   Post FEV1/FCV % % 45   FEV1-Pre L 0.56   FEV1-Predicted Pre % 27   FEV1-Post L 0.73   DLCO uncorrected ml/min/mmHg 6.70   DLCO UNC% % 33   DLCO corrected ml/min/mmHg 6.99   DLCO COR %Predicted % 34   DLVA Predicted % 50   TLC L 5.97   TLC % Predicted % 129   RV % Predicted % 215     FeNO:  Pathology:   Echocardiogram:   Heart Catheterization:     Assessment & Plan:     ICD-10-CM   1. Lung nodule  R91.1 Procedural/ Surgical Case Request: ROBOTIC ASSISTED NAVIGATIONAL BRONCHOSCOPY    Ambulatory referral to Pulmonology    CT Super D  Chest Wo Contrast    2. Chronic obstructive pulmonary disease, unspecified COPD type (HCC)  J44.9     3. Acute on chronic respiratory failure with hypercapnia (HCC)  J96.22       Discussion:  This is a 77 year old female multiple medical morbidities, chronic hypoxemic respiratory failure related to COPD and chronic diastolic heart failure.  She has a lung cancer  screening CT that was abnormal that led to a PET scan which shows abnormal hypermetabolic uptake.  Plan: Today in the office we talked about the risk benefits and alternatives of various options to include bronchoscopy biopsy as well as referral to radiation oncology for punitive radiation treatments with known no diagnosis. She is moderate risk for postoperative respiratory complications having general anesthesia. I think of the options of bronchoscopy versus trans thoracic needle aspiration bronchoscopy would be safer in her setting.  I do not think that she could tolerate having any kind of post pneumothorax complication and the risk for this procedure transthoracic biopsy would be higher than bronchoscopy. We talked about all of these details today as well as consideration for radiation oncology referral and punitive radiation treatments. Both the daughter and her would want to know that is a malignancy and what type it is before considerations for treatments. I do think that is reasonable to consider bronchoscopy but I also explained that it would not be out of the question that if she had respiratory complications afterward or bleeding or anything that would make her look as if she was not recovering as quickly from the procedure that I could potentially keep her overnight for observation. They are in agreement to this plan.  Tentative bronchoscopy date will be on 05/25/2023   Current Outpatient Medications:    albuterol (PROVENTIL HFA;VENTOLIN HFA) 108 (90 BASE) MCG/ACT inhaler, Inhale 2 puffs into the lungs every 6 (six) hours as needed for wheezing or shortness of breath., Disp: 1 Inhaler, Rfl: 0   amLODipine (NORVASC) 5 MG tablet, Take 5 mg by mouth every morning., Disp: , Rfl:    aspirin 81 MG chewable tablet, Chew 1 tablet (81 mg total) by mouth daily., Disp: , Rfl:    atorvastatin (LIPITOR) 10 MG tablet, TAKE 1 TABLET BY MOUTH EVERY DAY (Patient taking differently: Take 10 mg by mouth  daily.), Disp: 90 tablet, Rfl: 1   benzonatate (TESSALON) 100 MG capsule, Take 100 mg by mouth 3 (three) times daily as needed for cough. , Disp: , Rfl:    budesonide-formoterol (SYMBICORT) 160-4.5 MCG/ACT inhaler, Inhale 2 puffs into the lungs 2 (two) times daily., Disp: 1 Inhaler, Rfl: 4   Calcium 600-200 MG-UNIT tablet, Take 1 tablet by mouth daily., Disp: , Rfl:    furosemide (LASIX) 40 MG tablet, Take 40 mg by mouth daily as needed for fluid or edema. , Disp: , Rfl:    gabapentin (NEURONTIN) 300 MG capsule, Take 900 mg by mouth at bedtime., Disp: , Rfl:    iron polysaccharides (NIFEREX) 150 MG capsule, Take 150 mg by mouth daily., Disp: , Rfl:    LORazepam (ATIVAN) 1 MG tablet, Take 1 tablet (1 mg total) by mouth every 6 (six) hours as needed for anxiety., Disp: 30 tablet, Rfl: 0   losartan (COZAAR) 50 MG tablet, Take 50 mg by mouth daily., Disp: , Rfl:    predniSONE (DELTASONE) 20 MG tablet, Take 2 tablets (40 mg total) by mouth daily with breakfast., Disp: 10 tablet, Rfl: 0   raloxifene (EVISTA) 60 MG tablet, Take 60  mg by mouth every morning., Disp: , Rfl:    tiotropium (SPIRIVA) 18 MCG inhalation capsule, Place 18 mcg into inhaler and inhale daily., Disp: , Rfl:    traZODone (DESYREL) 100 MG tablet, Take 1 tablet (100 mg total) by mouth at bedtime as needed for sleep., Disp: 10 tablet, Rfl: 0  I spent 42 minutes dedicated to the care of this patient on the date of this encounter to include pre-visit review of records, face-to-face time with the patient discussing conditions above, post visit ordering of testing, clinical documentation with the electronic health record, making appropriate referrals as documented, and communicating necessary findings to members of the patients care team.    Josephine Igo, DO [facility] 05/18/2023 9:58 AM

## 2023-05-17 NOTE — Progress Notes (Unsigned)
Synopsis: Referred in September 2024 for pulmonary nodule by Katie James, MD  Subjective:   PATIENT ID: Katie Waller GENDER: female DOB: December 21, 1945, MRN: [id number]  Chief Complaint  Patient presents with   Follow-up    F/up on PET    This is a 77 year old female, past medical history of chronic diastolic heart failure, hypertension, chronic hypoxemic respiratory failure baseline 4 L nasal cannula.  She is a former smoker.  She had lung cancer screening CT last year and a follow-up CT scan this year that shows a new left lower lobe nodule.  Nuclear medicine pet image was obtained which shows hypermetabolic uptake concerning for primary malignancy.    Past Medical History:  Diagnosis Date   Abnormal finding on EKG 09/19/2013   Acute on chronic respiratory failure with hypercapnia (HCC)    Acute respiratory failure (HCC) 06/08/2016   Anxiety 11/09/2020   Asthma    Carotid bruit 11/09/2020   Chronic diastolic CHF (congestive heart failure) (HCC) 07/28/2017   Chronic obstructive pulmonary disease, unspecified (HCC) 11/09/2020   COPD, group D, by GOLD 2017 classification (HCC) 09/17/2013   Cough 11/09/2020   Decreased estrogen level 11/09/2020   Diabetes (HCC)    Edema 11/09/2020   Essential hypertension 09/18/2013   Goals of care, counseling/discussion    Hardening of the aorta (main artery of the heart) (HCC) 11/09/2020   Heart disease    Hypertension    Hypertensive heart failure (HCC) 11/09/2020   Hypoxia 11/09/2020   Insomnia 11/09/2020   Iron deficiency anemia 11/09/2020   Large liver 11/09/2020   Near syncope 09/17/2013   Osteoporosis 11/09/2020   Palliative care encounter    Prediabetes 09/19/2013   Pure hypercholesterolemia 11/09/2020   Raynaud's disease 11/09/2020   Sinus tachycardia 09/18/2013   Skin sensation disturbance 11/09/2020   Smoker 09/17/2013   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   Transient ischemic attack 11/09/2020   Venous stasis of both lower extremities 06/13/2016      Family History  Problem Relation Age of Onset   Hyperlipidemia Mother    Hypertension Mother    Hypertension Father    Hyperlipidemia Father      Past Surgical History:  Procedure Laterality Date   KNEE SURGERY Left     Social History   Socioeconomic History   Marital status: Divorced    Spouse name: Not on file   Number of children: Not on file   Years of education: Not on file   Highest education level: Not on file  Occupational History   Not on file  Tobacco Use   Smoking status: Former    Current packs/day: 0.00    Average packs/day: 1 pack/day for 55.0 years (55.0 ttl pk-yrs)    Types: Cigarettes    Start date: 07/22/2017    Quit date: 07/23/2017    Years since quitting: 5.8   Smokeless tobacco: Never  Vaping Use   Vaping status: Former  Substance and Sexual Activity   Alcohol use: No   Drug use: No   Sexual activity: Never  Other Topics Concern   Not on file  Social History Narrative   ** Merged History Encounter **       Social Determinants of Health   Financial Resource Strain: Not on file  Food Insecurity: Not on file  Transportation Needs: Not on file  Physical Activity: Not on file  Stress: Not on file  Social Connections: Not on file  Intimate Partner Violence: Not on file  Allergies  Allergen Reactions   Ace Inhibitors Swelling   Diphenhydramine Other (See Comments)   Doxycycline Hyclate Other (See Comments)   Codeine Nausea And Vomiting     Outpatient Medications Prior to Visit  Medication Sig Dispense Refill   albuterol (PROVENTIL HFA;VENTOLIN HFA) 108 (90 BASE) MCG/ACT inhaler Inhale 2 puffs into the lungs every 6 (six) hours as needed for wheezing or shortness of breath. 1 Inhaler 0   amLODipine (NORVASC) 5 MG tablet Take 5 mg by mouth every morning.     aspirin 81 MG chewable tablet Chew 1 tablet (81 mg total) by mouth daily.     atorvastatin (LIPITOR) 10 MG tablet TAKE 1 TABLET BY MOUTH EVERY DAY (Patient taking  differently: Take 10 mg by mouth daily.) 90 tablet 1   benzonatate (TESSALON) 100 MG capsule Take 100 mg by mouth 3 (three) times daily as needed for cough.      budesonide-formoterol (SYMBICORT) 160-4.5 MCG/ACT inhaler Inhale 2 puffs into the lungs 2 (two) times daily. 1 Inhaler 4   Calcium 600-200 MG-UNIT tablet Take 1 tablet by mouth daily.     furosemide (LASIX) 40 MG tablet Take 40 mg by mouth daily as needed for fluid or edema.      gabapentin (NEURONTIN) 300 MG capsule Take 900 mg by mouth at bedtime.     iron polysaccharides (NIFEREX) 150 MG capsule Take 150 mg by mouth daily.     LORazepam (ATIVAN) 1 MG tablet Take 1 tablet (1 mg total) by mouth every 6 (six) hours as needed for anxiety. 30 tablet 0   losartan (COZAAR) 50 MG tablet Take 50 mg by mouth daily.     predniSONE (DELTASONE) 20 MG tablet Take 2 tablets (40 mg total) by mouth daily with breakfast. 10 tablet 0   raloxifene (EVISTA) 60 MG tablet Take 60 mg by mouth every morning.     tiotropium (SPIRIVA) 18 MCG inhalation capsule Place 18 mcg into inhaler and inhale daily.     traZODone (DESYREL) 100 MG tablet Take 1 tablet (100 mg total) by mouth at bedtime as needed for sleep. 10 tablet 0   No facility-administered medications prior to visit.    Review of Systems  Constitutional:  Negative for chills, fever, malaise/fatigue and weight loss.  HENT:  Negative for hearing loss, sore throat and tinnitus.   Eyes:  Negative for blurred vision and double vision.  Respiratory:  Positive for shortness of breath. Negative for cough, hemoptysis, sputum production, wheezing and stridor.   Cardiovascular:  Negative for chest pain, palpitations, orthopnea, leg swelling and PND.  Gastrointestinal:  Negative for abdominal pain, constipation, diarrhea, heartburn, nausea and vomiting.  Genitourinary:  Negative for dysuria, hematuria and urgency.  Musculoskeletal:  Negative for joint pain and myalgias.  Skin:  Negative for itching and rash.   Neurological:  Negative for dizziness, tingling, weakness and headaches.  Endo/Heme/Allergies:  Negative for environmental allergies. Does not bruise/bleed easily.  Psychiatric/Behavioral:  Negative for depression. The patient is not nervous/anxious and does not have insomnia.   All other systems reviewed and are negative.    Objective:  Physical Exam Vitals reviewed.  Constitutional:      General: She is not in acute distress.    Appearance: She is well-developed. She is obese.  HENT:     Head: Normocephalic and atraumatic.  Eyes:     General: No scleral icterus.    Conjunctiva/sclera: Conjunctivae normal.     Pupils: Pupils are equal, round, and reactive  to light.  Neck:     Vascular: No JVD.     Trachea: No tracheal deviation.  Cardiovascular:     Rate and Rhythm: Normal rate and regular rhythm.     Heart sounds: Normal heart sounds. No murmur heard. Pulmonary:     Effort: Pulmonary effort is normal. No tachypnea, accessory muscle usage or respiratory distress.     Breath sounds: No stridor. No wheezing, rhonchi or rales.     Comments: Severely diminished breath sounds bilaterally Abdominal:     General: Bowel sounds are normal. There is no distension.     Palpations: Abdomen is soft.     Tenderness: There is no abdominal tenderness.  Musculoskeletal:        General: No tenderness.     Cervical back: Neck supple.  Lymphadenopathy:     Cervical: No cervical adenopathy.  Skin:    General: Skin is warm and dry.     Capillary Refill: Capillary refill takes less than 2 seconds.     Findings: No rash.  Neurological:     Mental Status: She is alert and oriented to person, place, and time.  Psychiatric:        Behavior: Behavior normal.      Vitals:   05/18/23 0912  BP: 100/70  Pulse: (!) 105  SpO2: 94%  Weight: 158 lb 12.8 oz (72 kg)  Height: 5\' 1"  (1.549 m)   94% on RA BMI Readings from Last 3 Encounters:  05/18/23 30.00 kg/m  03/22/23 30.00 kg/m   10/14/22 30.05 kg/m   Wt Readings from Last 3 Encounters:  05/18/23 158 lb 12.8 oz (72 kg)  03/22/23 158 lb 12.8 oz (72 kg)  10/14/22 159 lb 0.6 oz (72.1 kg)     CBC    Component Value Date/Time   WBC 11.3 (H) 12/24/2018 1801   RBC 4.18 12/24/2018 1801   HGB 11.5 (L) 12/24/2018 1801   HCT 38.2 12/24/2018 1801   PLT 418 (H) 12/24/2018 1801   MCV 91.4 12/24/2018 1801   MCH 27.5 12/24/2018 1801   MCHC 30.1 12/24/2018 1801   RDW 13.9 12/24/2018 1801   LYMPHSABS 1.7 12/24/2018 1801   MONOABS 0.8 12/24/2018 1801   EOSABS 0.1 12/24/2018 1801   BASOSABS 0.1 12/24/2018 1801     Chest Imaging:  Nuclear medicine PET August 2024: Hypermetabolic uptake within a left lower lobe pulmonary nodule. The patient's images have been independently reviewed by me.    Pulmonary Functions Testing Results:    Latest Ref Rng & Units 10/02/2017    9:03 AM  PFT Results  FVC-Pre L 1.33   FVC-Predicted Pre % 49   FVC-Post L 1.62   FVC-Predicted Post % 60   Pre FEV1/FVC % % 42   Post FEV1/FCV % % 45   FEV1-Pre L 0.56   FEV1-Predicted Pre % 27   FEV1-Post L 0.73   DLCO uncorrected ml/min/mmHg 6.70   DLCO UNC% % 33   DLCO corrected ml/min/mmHg 6.99   DLCO COR %Predicted % 34   DLVA Predicted % 50   TLC L 5.97   TLC % Predicted % 129   RV % Predicted % 215     FeNO:  Pathology:   Echocardiogram:   Heart Catheterization:     Assessment & Plan:     ICD-10-CM   1. Lung nodule  R91.1 Procedural/ Surgical Case Request: ROBOTIC ASSISTED NAVIGATIONAL BRONCHOSCOPY    Ambulatory referral to Pulmonology    CT Super D  Chest Wo Contrast    2. Chronic obstructive pulmonary disease, unspecified COPD type (HCC)  J44.9     3. Acute on chronic respiratory failure with hypercapnia (HCC)  J96.22       Discussion:  This is a 77 year old female multiple medical morbidities, chronic hypoxemic respiratory failure related to COPD and chronic diastolic heart failure.  She has a lung cancer  screening CT that was abnormal that led to a PET scan which shows abnormal hypermetabolic uptake.  Plan: Today in the office we talked about the risk benefits and alternatives of various options to include bronchoscopy biopsy as well as referral to radiation oncology for punitive radiation treatments with known no diagnosis. She is moderate risk for postoperative respiratory complications having general anesthesia. I think of the options of bronchoscopy versus trans thoracic needle aspiration bronchoscopy would be safer in her setting.  I do not think that she could tolerate having any kind of post pneumothorax complication and the risk for this procedure transthoracic biopsy would be higher than bronchoscopy. We talked about all of these details today as well as consideration for radiation oncology referral and punitive radiation treatments. Both the daughter and her would want to know that is a malignancy and what type it is before considerations for treatments. I do think that is reasonable to consider bronchoscopy but I also explained that it would not be out of the question that if she had respiratory complications afterward or bleeding or anything that would make her look as if she was not recovering as quickly from the procedure that I could potentially keep her overnight for observation. They are in agreement to this plan.  Tentative bronchoscopy date will be on 05/25/2023   Current Outpatient Medications:    albuterol (PROVENTIL HFA;VENTOLIN HFA) 108 (90 BASE) MCG/ACT inhaler, Inhale 2 puffs into the lungs every 6 (six) hours as needed for wheezing or shortness of breath., Disp: 1 Inhaler, Rfl: 0   amLODipine (NORVASC) 5 MG tablet, Take 5 mg by mouth every morning., Disp: , Rfl:    aspirin 81 MG chewable tablet, Chew 1 tablet (81 mg total) by mouth daily., Disp: , Rfl:    atorvastatin (LIPITOR) 10 MG tablet, TAKE 1 TABLET BY MOUTH EVERY DAY (Patient taking differently: Take 10 mg by mouth  daily.), Disp: 90 tablet, Rfl: 1   benzonatate (TESSALON) 100 MG capsule, Take 100 mg by mouth 3 (three) times daily as needed for cough. , Disp: , Rfl:    budesonide-formoterol (SYMBICORT) 160-4.5 MCG/ACT inhaler, Inhale 2 puffs into the lungs 2 (two) times daily., Disp: 1 Inhaler, Rfl: 4   Calcium 600-200 MG-UNIT tablet, Take 1 tablet by mouth daily., Disp: , Rfl:    furosemide (LASIX) 40 MG tablet, Take 40 mg by mouth daily as needed for fluid or edema. , Disp: , Rfl:    gabapentin (NEURONTIN) 300 MG capsule, Take 900 mg by mouth at bedtime., Disp: , Rfl:    iron polysaccharides (NIFEREX) 150 MG capsule, Take 150 mg by mouth daily., Disp: , Rfl:    LORazepam (ATIVAN) 1 MG tablet, Take 1 tablet (1 mg total) by mouth every 6 (six) hours as needed for anxiety., Disp: 30 tablet, Rfl: 0   losartan (COZAAR) 50 MG tablet, Take 50 mg by mouth daily., Disp: , Rfl:    predniSONE (DELTASONE) 20 MG tablet, Take 2 tablets (40 mg total) by mouth daily with breakfast., Disp: 10 tablet, Rfl: 0   raloxifene (EVISTA) 60 MG tablet, Take 60  mg by mouth every morning., Disp: , Rfl:    tiotropium (SPIRIVA) 18 MCG inhalation capsule, Place 18 mcg into inhaler and inhale daily., Disp: , Rfl:    traZODone (DESYREL) 100 MG tablet, Take 1 tablet (100 mg total) by mouth at bedtime as needed for sleep., Disp: 10 tablet, Rfl: 0  I spent 42 minutes dedicated to the care of this patient on the date of this encounter to include pre-visit review of records, face-to-face time with the patient discussing conditions above, post visit ordering of testing, clinical documentation with the electronic health record, making appropriate referrals as documented, and communicating necessary findings to members of the patients care team.    Josephine Igo, DO [facility] 05/18/2023 9:58 AM

## 2023-05-18 ENCOUNTER — Encounter: Payer: Self-pay | Admitting: Pulmonary Disease

## 2023-05-18 ENCOUNTER — Ambulatory Visit (INDEPENDENT_AMBULATORY_CARE_PROVIDER_SITE_OTHER): Payer: 59 | Admitting: Pulmonary Disease

## 2023-05-18 VITALS — BP 100/70 | HR 105 | Ht 61.0 in | Wt 158.8 lb

## 2023-05-18 DIAGNOSIS — J449 Chronic obstructive pulmonary disease, unspecified: Secondary | ICD-10-CM

## 2023-05-18 DIAGNOSIS — J9622 Acute and chronic respiratory failure with hypercapnia: Secondary | ICD-10-CM | POA: Diagnosis not present

## 2023-05-18 DIAGNOSIS — R911 Solitary pulmonary nodule: Secondary | ICD-10-CM

## 2023-05-18 NOTE — Patient Instructions (Signed)
Thank you for visiting Dr. Tonia Brooms at [facility]. Today we recommend the following: Orders Placed This Encounter  Procedures   Procedural/ Surgical Case Request: ROBOTIC ASSISTED NAVIGATIONAL BRONCHOSCOPY   CT Super D Chest Wo Contrast   Ambulatory referral to Pulmonology   Return 1 week with Kandice Robinsons, NP after bronchoscopy.    Please do your part to reduce the spread of COVID-19.

## 2023-05-19 ENCOUNTER — Ambulatory Visit (HOSPITAL_COMMUNITY): Payer: 59 | Attending: Internal Medicine

## 2023-05-19 ENCOUNTER — Encounter: Payer: Self-pay | Admitting: Cardiology

## 2023-05-19 ENCOUNTER — Ambulatory Visit: Payer: 59 | Attending: Cardiology | Admitting: Cardiology

## 2023-05-19 ENCOUNTER — Telehealth: Payer: Self-pay | Admitting: Pulmonary Disease

## 2023-05-19 VITALS — BP 116/64 | HR 103 | Ht 61.0 in | Wt 158.0 lb

## 2023-05-19 DIAGNOSIS — I6523 Occlusion and stenosis of bilateral carotid arteries: Secondary | ICD-10-CM

## 2023-05-19 DIAGNOSIS — R0609 Other forms of dyspnea: Secondary | ICD-10-CM | POA: Diagnosis not present

## 2023-05-19 DIAGNOSIS — I700 Atherosclerosis of aorta: Secondary | ICD-10-CM

## 2023-05-19 DIAGNOSIS — I110 Hypertensive heart disease with heart failure: Secondary | ICD-10-CM | POA: Diagnosis not present

## 2023-05-19 DIAGNOSIS — Z01818 Encounter for other preprocedural examination: Secondary | ICD-10-CM

## 2023-05-19 DIAGNOSIS — J449 Chronic obstructive pulmonary disease, unspecified: Secondary | ICD-10-CM | POA: Diagnosis not present

## 2023-05-19 DIAGNOSIS — I5032 Chronic diastolic (congestive) heart failure: Secondary | ICD-10-CM

## 2023-05-19 LAB — ECHOCARDIOGRAM COMPLETE
Area-P 1/2: 5.07 cm2
Height: 61 in
P 1/2 time: 224 ms
S' Lateral: 2.25 cm
Weight: 2528 [oz_av]

## 2023-05-19 NOTE — Patient Instructions (Addendum)
Medication Instructions:  Your physician recommends that you continue on your current medications as directed. Please refer to the Current Medication list given to you today.  *If you need a refill on your cardiac medications before your next appointment, please call your pharmacy*   Lab Work: None Ordered If you have labs (blood work) drawn today and your tests are completely normal, you will receive your results only by: MyChart Message (if you have MyChart) OR A paper copy in the mail If you have any lab test that is abnormal or we need to change your treatment, we will call you to review the results.   Testing/Procedures: Your physician has requested that you have an echocardiogram. Echocardiography is a painless test that uses sound waves to create images of your heart. It provides your doctor with information about the size and shape of your heart and how well your heart's chambers and valves are working. This procedure takes approximately one hour. There are no restrictions for this procedure. Please do NOT wear cologne, perfume, aftershave, or lotions (deodorant is allowed). Please arrive 15 minutes prior to your appointment time.      Follow-Up: At [facility], you and your health needs are our priority.  As part of our continuing mission to provide you with exceptional heart care, we have created designated Provider Care Teams.  These Care Teams include your primary Cardiologist (physician) and Advanced Practice Providers (APPs -  Physician Assistants and Nurse Practitioners) who all work together to provide you with the care you need, when you need it.  We recommend signing up for the patient portal called "MyChart".  Sign up information is provided on this After Visit Summary.  MyChart is used to connect with patients for Virtual Visits (Telemedicine).  Patients are able to view lab/test results, encounter notes, upcoming appointments, etc.  Non-urgent messages can be sent to  your provider as well.   To learn more about what you can do with MyChart, go to [email or web address].    Your next appointment:   6 month(s)  The format for your next appointment:   In Person  Provider:   Gypsy Balsam, MD    Other Instructions NA

## 2023-05-19 NOTE — Telephone Encounter (Signed)
[name] daughter states patient needs surgical clearance sent to her cardiologist. Patient scheduled for Bronch procedure. [name] phone number is [phone].

## 2023-05-19 NOTE — Telephone Encounter (Signed)
Pt recently seen by both cardiology as well as Dr. Tonia Brooms. Routing to both Dr. Tonia Brooms and Verlon Au for help with this to see what needs to be done.

## 2023-05-19 NOTE — Progress Notes (Unsigned)
Cardiology Office Note:    Date:  05/19/2023   ID:  Katie Waller, DOB 01-20-1946, MRN [id number]  PCP:  [name], MD  Cardiologist:  Gypsy Balsam, MD    Referring MD: No ref. provider found   Chief Complaint  Patient presents with   CLearance for lung biopsy    Dr. Tonia Brooms for next Thursday    History of Present Illness:    Katie Waller is a 77 y.o. female with past medical history significant for advanced COPD, she is currently on oxygen, diastolic congestive heart failure, peripheral vascular disease in form of carotic arterial disease but not critical, calcification of the coronary artery.  She comes today for follow-up and also she is supposed to have biopsy of the lungs done.  Apparently she got multiple nodules in her lungs abnormal became larger PET scan was done positive she need to have a biopsy done.  Understand that she can have bronchoscopy with biopsy.  She is oxygen dependent she sits on the chair all the time clearly does not do 4 METS.  Denies have any chest pain tightness squeezing pressure burning chest.  Past Medical History:  Diagnosis Date   Abnormal finding on EKG 09/19/2013   Acute on chronic respiratory failure with hypercapnia (HCC)    Acute respiratory failure (HCC) 06/08/2016   Anxiety 11/09/2020   Asthma    Carotid bruit 11/09/2020   Chronic diastolic CHF (congestive heart failure) (HCC) 07/28/2017   Chronic obstructive pulmonary disease, unspecified (HCC) 11/09/2020   COPD, group D, by GOLD 2017 classification (HCC) 09/17/2013   Cough 11/09/2020   Decreased estrogen level 11/09/2020   Diabetes (HCC)    Edema 11/09/2020   Essential hypertension 09/18/2013   Goals of care, counseling/discussion    Hardening of the aorta (main artery of the heart) (HCC) 11/09/2020   Heart disease    Hypertension    Hypertensive heart failure (HCC) 11/09/2020   Hypoxia 11/09/2020   Insomnia 11/09/2020   Iron deficiency anemia 11/09/2020   Large liver 11/09/2020   Near syncope  09/17/2013   Osteoporosis 11/09/2020   Palliative care encounter    Prediabetes 09/19/2013   Pure hypercholesterolemia 11/09/2020   Raynaud's disease 11/09/2020   Sinus tachycardia 09/18/2013   Skin sensation disturbance 11/09/2020   Smoker 09/17/2013   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   amLODipine (NORVASC) 5 MG tablet Take 5 mg by mouth every morning.   aspirin 81 MG chewable tablet Chew 1 tablet (81 mg total) by mouth daily.   atorvastatin (LIPITOR) 10 MG tablet TAKE 1 TABLET BY MOUTH EVERY DAY (Patient taking differently: Take 10 mg by mouth daily.)   benzonatate (TESSALON) 100 MG capsule Take 100 mg by mouth 3 (three) times daily as needed for cough.    budesonide-formoterol (SYMBICORT) 160-4.5 MCG/ACT inhaler Inhale 2 puffs into the lungs 2 (two) times daily.   Calcium 600-200 MG-UNIT tablet Take 1 tablet by mouth daily.   furosemide (LASIX) 40 MG tablet Take 40 mg by mouth daily as needed for fluid or edema.    gabapentin (NEURONTIN) 300  MG capsule Take 900 mg by mouth at bedtime.   iron polysaccharides (NIFEREX) 150 MG capsule Take 150 mg by mouth daily.   LORazepam (ATIVAN) 1 MG tablet Take 1 tablet (1 mg total) by mouth every 6 (six) hours as needed for anxiety.   losartan (COZAAR) 50 MG tablet Take 50 mg by mouth daily.   predniSONE (DELTASONE) 20 MG tablet Take 2 tablets (40 mg total) by mouth daily with breakfast.   raloxifene (EVISTA) 60 MG tablet Take 60 mg by mouth every morning.   tiotropium (SPIRIVA) 18 MCG inhalation capsule Place 18 mcg into inhaler and inhale daily.   traZODone  (DESYREL) 100 MG tablet Take 1 tablet (100 mg total) by mouth at bedtime as needed for sleep.     Allergies:   Ace inhibitors, Diphenhydramine, Doxycycline hyclate, and Codeine   Social History   Socioeconomic History   Marital status: Divorced    Spouse name: Not on file   Number of children: Not on file   Years of education: Not on file   Highest education level: Not on file  Occupational History   Not on file  Tobacco Use   Smoking status: Former    Current packs/day: 0.00    Average packs/day: 1 pack/day for 55.0 years (55.0 ttl pk-yrs)    Types: Cigarettes    Start date: 07/22/2017    Quit date: 07/23/2017    Years since quitting: 5.8   Smokeless tobacco: Never  Vaping Use   Vaping status: Former  Substance and Sexual Activity   Alcohol use: No   Drug use: No   Sexual activity: Never  Other Topics Concern   Not on file  Social History Narrative   ** Merged History Encounter **       Social Determinants of Health   Financial Resource Strain: Not on file  Food Insecurity: Not on file  Transportation Needs: Not on file  Physical Activity: Not on file  Stress: Not on file  Social Connections: Not on file     Family History: The patient's family history includes Hyperlipidemia in her father and mother; Hypertension in her father and mother. ROS:   Please see the history of present illness.    All 14 point review of systems negative except as described per history of present illness  EKGs/Labs/Other Studies Reviewed:    EKG Interpretation Date/Time:  Friday May 19 2023 10:41:52 EDT Ventricular Rate:  102 PR Interval:  164 QRS Duration:  68 QT Interval:  308 QTC Calculation: 401 R Axis:   68  Text Interpretation: Sinus tachycardia Low voltage QRS When compared with ECG of 24-Jul-2017 11:17, PREVIOUS ECG IS PRESENT Confirmed by Gypsy Balsam [phone]) on 05/19/2023 10:48:07 AM    Recent Labs: No results found for requested labs within last 365  days.  Recent Lipid Panel    Component Value Date/Time   CHOL 169 09/18/2013 0614   TRIG 134 09/18/2013 0614   HDL 76 09/18/2013 0614   CHOLHDL 2.2 09/18/2013 0614   VLDL 27 09/18/2013 0614   LDLCALC 66 09/18/2013 0614    Physical Exam:    VS:  BP 116/64 (BP Location: Left Arm, Patient Position: Sitting)   Pulse (!) 103   Ht 5\' 1"  (1.549 m)   Wt 158 lb (71.7 kg)   SpO2 (!) 83%   BMI 29.85 kg/m     Wt Readings from Last 3 Encounters:  05/19/23 158 lb (71.7 kg)  05/18/23 158 lb 12.8 oz (72  kg)  03/22/23 158 lb 12.8 oz (72 kg)     GEN:  Well nourished, well developed in no acute distress HEENT: Normal NECK: No JVD; No carotid bruits LYMPHATICS: No lymphadenopathy CARDIAC: RRR, no murmurs, no rubs, no gallops RESPIRATORY: Poor air entry but not really with few wheezes ABDOMEN: Soft, non-tender, non-distended MUSCULOSKELETAL:  No edema; No deformity  SKIN: Warm and dry LOWER EXTREMITIES: no swelling NEUROLOGIC:  Alert and oriented x 3 PSYCHIATRIC:  Normal affect   ASSESSMENT:    1. Pre-op evaluation   2. Bilateral carotid artery stenosis   3. Chronic diastolic CHF (congestive heart failure) (HCC)   4. Hardening of the aorta (main artery of the heart) (HCC)   5. Hypertensive heart failure (HCC)   6. COPD, group D, by GOLD 2017 classification (HCC)    PLAN:    In order of problems listed above:  Preop evaluation for this lady with advanced COPD.  Clearly it is a very difficult situation obviously she is at high risk for this procedure for multiple reasons especially from pulmonary point of view.  However at the same time biopsy is needed to determine what kind of cancer with dealing with so it could be appropriately treated.  I think is some value to get echocardiogram to give advice in terms of fluid management, but regardless what we find out surgery need to be done, therefore considering the fact that this is high risk for multiple reasons I think we can proceed.   Both patient and family understand risk of procedure. Peripheral vascular disease: Last carotic ultrasounds reviewed not critical we will continue watching. Chronic diastolic congestive heart failure echocardiogram will be done. COPD noted advanced. Dyslipidemia she is on Lipitor 10 mg daily which is moderate intensity statin I did review K PN which show me LDL 70 HDL 81.   Medication Adjustments/Labs and Tests Ordered: Current medicines are reviewed at length with the patient today.  Concerns regarding medicines are outlined above.  Orders Placed This Encounter  Procedures   EKG 12-Lead   Medication changes: No orders of the defined types were placed in this encounter.   Signed, Georgeanna Lea, MD, [facility] 05/19/2023 10:58 AM    [facility]

## 2023-05-22 NOTE — Telephone Encounter (Signed)
Dr Tonia Brooms is doing the procedure   Pt seen by cards 05/19/23- Dr Bing Matter and he states the following in his note:   Preop evaluation for this lady with advanced COPD. Clearly it is a very difficult situation obviously she is at high risk for this procedure for multiple reasons especially from pulmonary point of view. However at the same time biopsy is needed to determine what kind of cancer with dealing with so it could be appropriately treated. I think is some value to get echocardiogram to give advice in terms of fluid management, but regardless what we find out surgery need to be done, therefore considering the fact that this is high risk for multiple reasons I think we can proceed. Both patient and family understand risk of procedure.   Routing to Dr. Tonia Brooms   Pt's daughter aware

## 2023-05-23 ENCOUNTER — Ambulatory Visit
Admission: RE | Admit: 2023-05-23 | Discharge: 2023-05-23 | Disposition: A | Discharge status: Home / Self Care | Payer: 59 | Source: Ambulatory Visit | Attending: Pulmonary Disease | Admitting: Pulmonary Disease

## 2023-05-23 DIAGNOSIS — R911 Solitary pulmonary nodule: Secondary | ICD-10-CM

## 2023-05-23 DIAGNOSIS — I700 Atherosclerosis of aorta: Secondary | ICD-10-CM | POA: Diagnosis not present

## 2023-05-23 DIAGNOSIS — J439 Emphysema, unspecified: Secondary | ICD-10-CM | POA: Diagnosis not present

## 2023-05-24 ENCOUNTER — Telehealth: Payer: Self-pay | Admitting: Pulmonary Disease

## 2023-05-24 ENCOUNTER — Other Ambulatory Visit: Payer: Self-pay

## 2023-05-24 ENCOUNTER — Encounter (HOSPITAL_COMMUNITY): Payer: Self-pay | Admitting: Pulmonary Disease

## 2023-05-24 NOTE — Progress Notes (Signed)
Anesthesia Chart Review: Same day workup  77 year old female with pertinent history including former smoker with associated COPD and chronic hypoxemic respiratory failure on 4 L continuous O2, HTN, HFpEF, TIA, moderate carotid disease, coronary calcifications.  Patient seen by cardiologist Dr. Bing Matter on 05/19/2023 for preop evaluation.  Per note, "Preop evaluation for this lady with advanced COPD.  Clearly it is a very difficult situation obviously she is at high risk for this procedure for multiple reasons especially from pulmonary point of view.  However at the same time biopsy is needed to determine what kind of cancer with dealing with so it could be appropriately treated.  I think is some value to get echocardiogram to give advice in terms of fluid management, but regardless what we find out surgery need to be done, therefore considering the fact that this is high risk for multiple reasons I think we can proceed.  Both patient and family understand risk of procedure."  Echo done 05/19/2023 showed EF 50 to 55%, grade 1 DD, no significant valvular abnormalities.  Will need day of surgery labs and eval.   EKG 05/19/23: Sinus tachycardia. Rate 102. Low voltage QRS.  TTE 05/19/2023: 1. Left ventricular ejection fraction, by estimation, is 50 to 55%. Left  ventricular ejection fraction by 3D volume is 53 %. The left ventricle has  low normal function. The left ventricle has no regional wall motion  abnormalities. There is mild left  ventricular hypertrophy. Left ventricular diastolic parameters are  consistent with Grade I diastolic dysfunction (impaired relaxation).   2. Right ventricular systolic function is normal. The right ventricular  size is normal.   3. The mitral valve is grossly normal. Trivial mitral valve  regurgitation.   4. The aortic valve was not well visualized. Aortic valve regurgitation  is trivial. Aortic valve sclerosis/calcification is present, without any  evidence of  aortic stenosis.   5. The inferior vena cava is normal in size with greater than 50%  respiratory variability, suggesting right atrial pressure of 3 mmHg.   Comparison(s): Prior images unable to be directly viewed, comparison made  by report only. Changes from prior study are noted. 04/24/2020: LVEF  55-60%.   Carotid duplex 12/23/2022: IMPRESSION: Right: Heterogeneous and partially calcified plaque at the right carotid bifurcation contributes to 50%-69% stenosis by established duplex criteria   Left: Color duplex indicates moderate heterogeneous and calcified plaque, with no hemodynamically significant stenosis by duplex criteria in the extracranial cerebrovascular circulation.   Waveform of the left vertebral artery indicates proximal left subclavian artery stenosis, without complete reversal. Correlation with office based upper extremity blood pressure cuffs may be useful    Zannie Cove [facility] Short Stay Center/Anesthesiology Phone [phone] 05/24/2023 11:50 AM

## 2023-05-24 NOTE — Anesthesia Preprocedure Evaluation (Addendum)
Anesthesia Evaluation  Patient identified by MRN, date of birth, ID band Patient awake    Reviewed: Allergy & Precautions, NPO status , Patient's Chart, lab work & pertinent test results  Airway Mallampati: III  TM Distance: >3 FB Neck ROM: Full    Dental  (+) Edentulous Lower, Edentulous Upper   Pulmonary asthma , COPD (4.5 L [place]),  COPD inhaler and oxygen dependent, former smoker   Pulmonary exam normal        Cardiovascular hypertension, Pt. on medications +CHF  Normal cardiovascular exam  ECHO: 1. Left ventricular ejection fraction, by estimation, is 50 to 55%. Left  ventricular ejection fraction by 3D volume is 53 %. The left ventricle has  low normal function. The left ventricle has no regional wall motion  abnormalities. There is mild left  ventricular hypertrophy. Left ventricular diastolic parameters are  consistent with Grade I diastolic dysfunction (impaired relaxation).   2. Right ventricular systolic function is normal. The right ventricular  size is normal.   3. The mitral valve is grossly normal. Trivial mitral valve  regurgitation.   4. The aortic valve was not well visualized. Aortic valve regurgitation  is trivial. Aortic valve sclerosis/calcification is present, without any  evidence of aortic stenosis.   5. The inferior vena cava is normal in size with greater than 50%  respiratory variability, suggesting right atrial pressure of 3 mmHg.     Neuro/Psych   Anxiety     negative neurological ROS     GI/Hepatic negative GI ROS, Neg liver ROS,,,  Endo/Other  diabetes    Renal/GU Renal disease     Musculoskeletal  (+) Arthritis ,    Abdominal   Peds  Hematology  (+) Blood dyscrasia, anemia   Anesthesia Other Findings lung nodule  Reproductive/Obstetrics                             Anesthesia Physical Anesthesia Plan  ASA: 4  Anesthesia Plan: General   Post-op Pain  Management:    Induction: Intravenous  PONV Risk Score and Plan: 3 and Ondansetron, Dexamethasone and Treatment may vary due to age or medical condition  Airway Management Planned: Oral ETT  Additional Equipment:   Intra-op Plan:   Post-operative Plan: Extubation in OR and Possible Post-op intubation/ventilation  Informed Consent: I have reviewed the patients History and Physical, chart, labs and discussed the procedure including the risks, benefits and alternatives for the proposed anesthesia with the patient or authorized representative who has indicated his/her understanding and acceptance.     Dental advisory given and Consent reviewed with POA  Plan Discussed with: CRNA and Surgeon  Anesthesia Plan Comments: (PAT note by Antionette Poles, PA-C: 77 year old female with pertinent history including former smoker with associated COPD and chronic hypoxemic respiratory failure on 4 L continuous O2, HTN, HFpEF, TIA, moderate carotid disease, coronary calcifications.  Patient seen by cardiologist Dr. Bing Matter on 05/19/2023 for preop evaluation.  Per note, "Preop evaluation for this lady with advanced COPD.  Clearly it is a very difficult situation obviously she is at high risk for this procedure for multiple reasons especially from pulmonary point of view.  However at the same time biopsy is needed to determine what kind of cancer with dealing with so it could be appropriately treated.  I think is some value to get echocardiogram to give advice in terms of fluid management, but regardless what we find out surgery need to be done,  therefore considering the fact that this is high risk for multiple reasons I think we can proceed.  Both patient and family understand risk of procedure."  Echo done 05/19/2023 showed EF 50 to 55%, grade 1 DD, no significant valvular abnormalities.  Will need day of surgery labs and eval.   EKG 05/19/23: Sinus tachycardia. Rate 102. Low voltage QRS.  TTE 05/19/2023: 1.  Left ventricular ejection fraction, by estimation, is 50 to 55%. Left  ventricular ejection fraction by 3D volume is 53 %. The left ventricle has  low normal function. The left ventricle has no regional wall motion  abnormalities. There is mild left  ventricular hypertrophy. Left ventricular diastolic parameters are  consistent with Grade I diastolic dysfunction (impaired relaxation).  2. Right ventricular systolic function is normal. The right ventricular  size is normal.  3. The mitral valve is grossly normal. Trivial mitral valve  regurgitation.  4. The aortic valve was not well visualized. Aortic valve regurgitation  is trivial. Aortic valve sclerosis/calcification is present, without any  evidence of aortic stenosis.  5. The inferior vena cava is normal in size with greater than 50%  respiratory variability, suggesting right atrial pressure of 3 mmHg.   Comparison(s): Prior images unable to be directly viewed, comparison made  by report only. Changes from prior study are noted. 04/24/2020: LVEF  55-60%.   Carotid duplex 12/23/2022: IMPRESSION: Right: Heterogeneous and partially calcified plaque at the right carotid bifurcation contributes to 50%-69% stenosis by established duplex criteria  Left: Color duplex indicates moderate heterogeneous and calcified plaque, with no hemodynamically significant stenosis by duplex criteria in the extracranial cerebrovascular circulation.  Waveform of the left vertebral artery indicates proximal left subclavian artery stenosis, without complete reversal. Correlation with office based upper extremity blood pressure cuffs may be useful   )        Anesthesia Quick Evaluation

## 2023-05-24 NOTE — Progress Notes (Addendum)
PCP - [name] Cardiologist - Cardiologist  PPM/ICD -  Device Orders -  Rep Notified -   Chest x-ray - Chest Ct 04-30-23 EKG - 05-19-23 Stress Test -  ECHO - 05-19-23 Cardiac Cath -   CPAP -  DOES use O2 at 4 1/12L on concentrator  DM - Pre DM Blood Thinner Instructions: Denies Aspirin Instructions:  Daughter states "they were told to continue with medication"  also states "patient would not be taking any medications until after the procedure  ERAS Protcol -  NPO  COVID TEST- n/a  Anesthesia review: Yes. HX of HTN, COPD, DM  Patient verbally denies any shortness of breath, fever, cough and chest pain during phone call   -------------  SDW INSTRUCTIONS given:  Your procedure is scheduled on Sept 05-25-23.  Report to [facility] Main Entrance "A" at 10:30 A.M., and check in at the Admitting office.  Call this number if you have problems the morning of surgery:  [phone]   Remember:  Do not eat after midnight the night before your surgery  You may drink clear liquids until  the morning of your surgery.   Clear liquids allowed are: Water, Non-Citrus Juices (without pulp), Carbonated Beverages, Clear Tea, Black Coffee Only, and Gatorade    Take these medicines the morning of surgery with A SIP OF WATER  amLODipine (NORVASC)  atorvastatin (LIPITOR)  budesonide-formoterol (SYMBICORT)   tiotropium (SPIRIVA)  raloxifene (EVISTA)    IF needed: albuterol (PROVENTIL HFA;VENTOLIN HFA) 108 (90 BASE) MCG/ACT inhaler  benzonatate (TESSALON)  LORazepam (ATIVAN)  predniSONE (DELTASONE) ( Does not take daily per daughter has not taken medication for a few month, reports they reach to MD for new RX when patient has COPD exacerbation)    As of today, STOP taking any Aspirin (unless otherwise instructed by your surgeon) Aleve, Naproxen, Ibuprofen, Motrin, Advil, Goody's, BC's, all herbal medications, fish oil, and all vitamins.                      Do not wear jewelry, make  up, or nail polish            Do not wear lotions, powders, perfumes/colognes, or deodorant.            Do not shave 48 hours prior to surgery.  Men may shave face and neck.            Do not bring valuables to the hospital.            [facility] is not responsible for any belongings or valuables.  Do NOT Smoke (Tobacco/Vaping) 24 hours prior to your procedure If you use a CPAP at night, you may bring all equipment for your overnight stay.   Contacts, glasses, dentures or bridgework may not be worn into surgery.      For patients admitted to the hospital, discharge time will be determined by your treatment team.   Patients discharged the day of surgery will not be allowed to drive home, and someone needs to stay with them for 24 hours.    Special instructions:   [facility]- Preparing For Surgery  Before surgery, you can play an important role. Because skin is not sterile, your skin needs to be as free of germs as possible. You can reduce the number of germs on your skin by washing with CHG (chlorahexidine gluconate) Soap before surgery.  CHG is an antiseptic cleaner which kills germs and bonds with the skin to  continue killing germs even after washing.    Oral Hygiene is also important to reduce your risk of infection.  Remember - BRUSH YOUR TEETH THE MORNING OF SURGERY WITH YOUR REGULAR TOOTHPASTE  Please do not use if you have an allergy to CHG or antibacterial soaps. If your skin becomes reddened/irritated stop using the CHG.  Do not shave (including legs and underarms) for at least 48 hours prior to first CHG shower. It is OK to shave your face.  Please follow these instructions carefully.   Shower the NIGHT BEFORE SURGERY and the MORNING OF SURGERY with DIAL Soap.   Pat yourself dry with a CLEAN TOWEL.  Wear CLEAN PAJAMAS to bed the night before surgery  Place CLEAN SHEETS on your bed the night of your first shower and DO NOT SLEEP WITH PETS.   Day of Surgery: Please  shower morning of surgery  Wear Clean/Comfortable clothing the morning of surgery Do not apply any deodorants/lotions.   Remember to brush your teeth WITH YOUR REGULAR TOOTHPASTE.   Questions were answered. Patient verbalized understanding of instructions.

## 2023-05-24 NOTE — Telephone Encounter (Signed)
9/18-CD dropped off. Chart noted. put in Dr. Myrlene Broker box.-TO)

## 2023-05-25 ENCOUNTER — Encounter (HOSPITAL_COMMUNITY)
Admission: RE | Disposition: A | Discharge status: Home / Self Care | Payer: Self-pay | Source: Home / Self Care | Attending: Pulmonary Disease

## 2023-05-25 ENCOUNTER — Ambulatory Visit (HOSPITAL_BASED_OUTPATIENT_CLINIC_OR_DEPARTMENT_OTHER): Payer: 59 | Admitting: Physician Assistant

## 2023-05-25 ENCOUNTER — Ambulatory Visit (HOSPITAL_COMMUNITY)
Admission: RE | Admit: 2023-05-25 | Discharge: 2023-05-25 | Disposition: A | Discharge status: Home / Self Care | Payer: 59 | Attending: Pulmonary Disease | Admitting: Pulmonary Disease

## 2023-05-25 ENCOUNTER — Ambulatory Visit (HOSPITAL_COMMUNITY): Payer: 59

## 2023-05-25 ENCOUNTER — Ambulatory Visit (HOSPITAL_COMMUNITY): Payer: 59 | Admitting: Physician Assistant

## 2023-05-25 ENCOUNTER — Encounter (HOSPITAL_COMMUNITY): Payer: Self-pay | Admitting: Pulmonary Disease

## 2023-05-25 DIAGNOSIS — I771 Stricture of artery: Secondary | ICD-10-CM | POA: Insufficient documentation

## 2023-05-25 DIAGNOSIS — J9622 Acute and chronic respiratory failure with hypercapnia: Secondary | ICD-10-CM | POA: Insufficient documentation

## 2023-05-25 DIAGNOSIS — R911 Solitary pulmonary nodule: Secondary | ICD-10-CM

## 2023-05-25 DIAGNOSIS — Z9981 Dependence on supplemental oxygen: Secondary | ICD-10-CM | POA: Diagnosis not present

## 2023-05-25 DIAGNOSIS — M1990 Unspecified osteoarthritis, unspecified site: Secondary | ICD-10-CM | POA: Diagnosis not present

## 2023-05-25 DIAGNOSIS — I7300 Raynaud's syndrome without gangrene: Secondary | ICD-10-CM | POA: Insufficient documentation

## 2023-05-25 DIAGNOSIS — I110 Hypertensive heart disease with heart failure: Secondary | ICD-10-CM | POA: Diagnosis not present

## 2023-05-25 DIAGNOSIS — I5032 Chronic diastolic (congestive) heart failure: Secondary | ICD-10-CM | POA: Diagnosis not present

## 2023-05-25 DIAGNOSIS — I130 Hypertensive heart and chronic kidney disease with heart failure and stage 1 through stage 4 chronic kidney disease, or unspecified chronic kidney disease: Secondary | ICD-10-CM | POA: Diagnosis not present

## 2023-05-25 DIAGNOSIS — C3432 Malignant neoplasm of lower lobe, left bronchus or lung: Secondary | ICD-10-CM | POA: Diagnosis not present

## 2023-05-25 DIAGNOSIS — J439 Emphysema, unspecified: Secondary | ICD-10-CM | POA: Diagnosis not present

## 2023-05-25 DIAGNOSIS — G4700 Insomnia, unspecified: Secondary | ICD-10-CM | POA: Diagnosis not present

## 2023-05-25 DIAGNOSIS — Z7951 Long term (current) use of inhaled steroids: Secondary | ICD-10-CM | POA: Diagnosis not present

## 2023-05-25 DIAGNOSIS — Z87891 Personal history of nicotine dependence: Secondary | ICD-10-CM | POA: Insufficient documentation

## 2023-05-25 DIAGNOSIS — I700 Atherosclerosis of aorta: Secondary | ICD-10-CM | POA: Insufficient documentation

## 2023-05-25 DIAGNOSIS — Z7952 Long term (current) use of systemic steroids: Secondary | ICD-10-CM | POA: Diagnosis not present

## 2023-05-25 DIAGNOSIS — E119 Type 2 diabetes mellitus without complications: Secondary | ICD-10-CM | POA: Diagnosis not present

## 2023-05-25 DIAGNOSIS — J4489 Other specified chronic obstructive pulmonary disease: Secondary | ICD-10-CM | POA: Insufficient documentation

## 2023-05-25 DIAGNOSIS — N1831 Chronic kidney disease, stage 3a: Secondary | ICD-10-CM | POA: Diagnosis not present

## 2023-05-25 DIAGNOSIS — M810 Age-related osteoporosis without current pathological fracture: Secondary | ICD-10-CM | POA: Insufficient documentation

## 2023-05-25 DIAGNOSIS — J449 Chronic obstructive pulmonary disease, unspecified: Secondary | ICD-10-CM

## 2023-05-25 DIAGNOSIS — Z48813 Encounter for surgical aftercare following surgery on the respiratory system: Secondary | ICD-10-CM | POA: Diagnosis not present

## 2023-05-25 HISTORY — PX: FIDUCIAL MARKER PLACEMENT: SHX6858

## 2023-05-25 HISTORY — PX: BRONCHIAL NEEDLE ASPIRATION BIOPSY: SHX5106

## 2023-05-25 HISTORY — DX: Dyspnea, unspecified: R06.00

## 2023-05-25 HISTORY — DX: Unspecified osteoarthritis, unspecified site: M19.90

## 2023-05-25 HISTORY — PX: BRONCHIAL BIOPSY: SHX5109

## 2023-05-25 LAB — COMPREHENSIVE METABOLIC PANEL
ALT: 13 U/L (ref 0–44)
AST: 22 U/L (ref 15–41)
Albumin: 3.4 g/dL — ABNORMAL LOW (ref 3.5–5.0)
Alkaline Phosphatase: 37 U/L — ABNORMAL LOW (ref 38–126)
Anion gap: 11 (ref 5–15)
BUN: 16 mg/dL (ref 8–23)
CO2: 29 mmol/L (ref 22–32)
Calcium: 9.5 mg/dL (ref 8.9–10.3)
Chloride: 99 mmol/L (ref 98–111)
Creatinine, Ser: 0.96 mg/dL (ref 0.44–1.00)
GFR, Estimated: >60 mL/min (ref >60)
Glucose, Bld: 101 mg/dL — ABNORMAL HIGH (ref 70–99)
Potassium: 4.3 mmol/L (ref 3.5–5.1)
Sodium: 139 mmol/L (ref 135–145)
Total Bilirubin: 0.7 mg/dL (ref 0.3–1.2)
Total Protein: 6.4 g/dL — ABNORMAL LOW (ref 6.5–8.1)

## 2023-05-25 LAB — CBC
HCT: 35.9 % — ABNORMAL LOW (ref 36.0–46.0)
Hemoglobin: 10.4 g/dL — ABNORMAL LOW (ref 12.0–15.0)
MCH: 27.2 pg (ref 26.0–34.0)
MCHC: 29 g/dL — ABNORMAL LOW (ref 30.0–36.0)
MCV: 93.7 fL (ref 80.0–100.0)
Platelets: 340 10*3/uL (ref 150–400)
RBC: 3.83 MIL/uL — ABNORMAL LOW (ref 3.87–5.11)
RDW: 14.5 % (ref 11.5–15.5)
WBC: 7.8 10*3/uL (ref 4.0–10.5)
nRBC: 0 % (ref 0.0–0.2)

## 2023-05-25 LAB — GLUCOSE, CAPILLARY
Glucose-Capillary: 102 mg/dL — ABNORMAL HIGH (ref 70–99)
Glucose-Capillary: 103 mg/dL — ABNORMAL HIGH (ref 70–99)

## 2023-05-25 SURGERY — BRONCHOSCOPY, WITH BIOPSY USING ELECTROMAGNETIC NAVIGATION
Anesthesia: General | Laterality: Left

## 2023-05-25 MED ORDER — LIDOCAINE 2% (20 MG/ML) 5 ML SYRINGE
INTRAMUSCULAR | Status: DC | PRN
  Administered 2023-05-25: 60 mg via INTRAVENOUS

## 2023-05-25 MED ORDER — CHLORHEXIDINE GLUCONATE 0.12 % MT SOLN
OROMUCOSAL | Status: AC
  Administered 2023-05-25: 15 mL via OROMUCOSAL
  Filled 2023-05-25: qty 15

## 2023-05-25 MED ORDER — PROPOFOL 500 MG/50ML IV EMUL
INTRAVENOUS | Status: DC | PRN
Start: 2023-05-25 — End: 2023-05-25
  Administered 2023-05-25: 75 ug/kg/min via INTRAVENOUS

## 2023-05-25 MED ORDER — FENTANYL CITRATE (PF) 100 MCG/2ML IJ SOLN
INTRAMUSCULAR | Status: AC
  Filled 2023-05-25: qty 2

## 2023-05-25 MED ORDER — PROPOFOL 10 MG/ML IV BOLUS
INTRAVENOUS | Status: DC | PRN
  Administered 2023-05-25: 50 mg via INTRAVENOUS

## 2023-05-25 MED ORDER — SUGAMMADEX SODIUM 200 MG/2ML IV SOLN
INTRAVENOUS | Status: DC | PRN
  Administered 2023-05-25: 200 mg via INTRAVENOUS

## 2023-05-25 MED ORDER — CHLORHEXIDINE GLUCONATE 0.12 % MT SOLN: 15.0000 mL | Freq: Once | OROMUCOSAL | Status: AC

## 2023-05-25 MED ORDER — ONDANSETRON HCL 4 MG/2ML IJ SOLN: 4.0000 mg | Freq: Once | INTRAMUSCULAR | Status: DC | PRN

## 2023-05-25 MED ORDER — ROCURONIUM BROMIDE 10 MG/ML (PF) SYRINGE
PREFILLED_SYRINGE | INTRAVENOUS | Status: DC | PRN
  Administered 2023-05-25: 30 mg via INTRAVENOUS
  Administered 2023-05-25: 10 mg via INTRAVENOUS

## 2023-05-25 MED ORDER — AMISULPRIDE (ANTIEMETIC) 5 MG/2ML IV SOLN
10.0000 mg | Freq: Once | INTRAVENOUS | Status: AC | PRN
  Administered 2023-05-25: 10 mg via INTRAVENOUS

## 2023-05-25 MED ORDER — PHENYLEPHRINE HCL-NACL 20-0.9 MG/250ML-% IV SOLN
INTRAVENOUS | Status: DC | PRN
  Administered 2023-05-25: 30 ug/min via INTRAVENOUS

## 2023-05-25 MED ORDER — DEXAMETHASONE SODIUM PHOSPHATE 10 MG/ML IJ SOLN
INTRAMUSCULAR | Status: DC | PRN
  Administered 2023-05-25: 10 mg via INTRAVENOUS

## 2023-05-25 MED ORDER — FENTANYL CITRATE (PF) 100 MCG/2ML IJ SOLN: 25.0000 ug | INTRAMUSCULAR | Status: DC | PRN

## 2023-05-25 MED ORDER — ACETAMINOPHEN 10 MG/ML IV SOLN: 1000.0000 mg | Freq: Once | INTRAVENOUS | Status: DC | PRN

## 2023-05-25 MED ORDER — LACTATED RINGERS IV SOLN: INTRAVENOUS | Status: DC

## 2023-05-25 MED ORDER — FENTANYL CITRATE (PF) 100 MCG/2ML IJ SOLN
INTRAMUSCULAR | Status: DC | PRN
Start: 2023-05-25 — End: 2023-05-25
  Administered 2023-05-25: 100 ug via INTRAVENOUS

## 2023-05-25 MED ORDER — ONDANSETRON HCL 4 MG/2ML IJ SOLN
INTRAMUSCULAR | Status: DC | PRN
  Administered 2023-05-25: 4 mg via INTRAVENOUS

## 2023-05-25 MED ORDER — AMISULPRIDE (ANTIEMETIC) 5 MG/2ML IV SOLN
INTRAVENOUS | Status: AC
  Filled 2023-05-25: qty 4

## 2023-05-25 SURGICAL SUPPLY — 1 items: superlock fiducial IMPLANT

## 2023-05-25 NOTE — Anesthesia Procedure Notes (Signed)
Procedure Name: Intubation Date/Time: 05/25/2023 1:01 PM  Performed by: Alease Medina, CRNAPre-anesthesia Checklist: Patient identified, Emergency Drugs available, Suction available and Patient being monitored Patient Re-evaluated:Patient Re-evaluated prior to induction Oxygen Delivery Method: Circle system utilized Preoxygenation: Pre-oxygenation with 100% oxygen Induction Type: IV induction Ventilation: Mask ventilation without difficulty and Oral airway inserted - appropriate to patient size Laryngoscope Size: Mac and 3 Grade View: Grade I Tube type: Oral Tube size: 8.5 mm Number of attempts: 1 Airway Equipment and Method: Stylet and Oral airway Placement Confirmation: ETT inserted through vocal cords under direct vision, positive ETCO2 and breath sounds checked- equal and bilateral Secured at: 21 cm Tube secured with: Tape Dental Injury: Teeth and Oropharynx as per pre-operative assessment

## 2023-05-25 NOTE — Op Note (Signed)
Video Bronchoscopy with Robotic Assisted Bronchoscopic Navigation   Date of Operation: 05/25/2023   Pre-op Diagnosis: LLL lung nodule   Post-op Diagnosis: LLL lung nodule   Surgeon: Josephine Igo, DO   Assistants: None   Anesthesia: General endotracheal anesthesia  Operation: Flexible video fiberoptic bronchoscopy with robotic assistance and biopsies.  Estimated Blood Loss: Minimal  Complications: None  Indications and History: Katie Waller is a 77 y.o. female with history of LLL lung nodule. The risks, benefits, complications, treatment options and expected outcomes were discussed with the patient.  The possibilities of pneumothorax, pneumonia, reaction to medication, pulmonary aspiration, perforation of a viscus, bleeding, failure to diagnose a condition and creating a complication requiring transfusion or operation were discussed with the patient who freely signed the consent.    Description of Procedure: The patient was seen in the Preoperative Area, was examined and was deemed appropriate to proceed.  The patient was taken to [facility] endoscopy room 3, identified as Clotine L Yon and the procedure verified as Flexible Video Fiberoptic Bronchoscopy.  A Time Out was held and the above information confirmed.   Prior to the date of the procedure a high-resolution CT scan of the chest was performed. Utilizing ION software program a virtual tracheobronchial tree was generated to allow the creation of distinct navigation pathways to the patient's parenchymal abnormalities. After being taken to the operating room general anesthesia was initiated and the patient  was orally intubated. The video fiberoptic bronchoscope was introduced via the endotracheal tube and a general inspection was performed which showed normal right and left lung anatomy, aspiration of the bilateral mainstems was completed to remove any remaining secretions. Robotic catheter inserted into patient's endotracheal tube.    Target #1 LLL lung nodule: The distinct navigation pathways prepared prior to this procedure were then utilized to navigate to patient's lesion identified on CT scan. The robotic catheter was secured into place and the vision probe was withdrawn.  Lesion location was approximated using fluoroscopy and 3d CBCT for Ct guided needle placement for peripheral targeting. Under fluoroscopic guidance, transbronchial needle biopsies, and transbronchial forceps biopsies were performed to be sent for cytology and pathology. Following biopsies a single fiducial was placed within proximity of the lesion using the fiducial catheter wire and delivery kit.   At the end of the procedure a general airway inspection was performed and there was no evidence of active bleeding. The bronchoscope was removed.  The patient tolerated the procedure well. There was no significant blood loss and there were no obvious complications. A post-procedural chest x-ray is pending.  Samples Target #1: 1. Transbronchial Wang needle biopsies from LLL 2. Transbronchial forceps biopsies from LLL  Plans:  The patient will be discharged from the PACU to home when recovered from anesthesia and after chest x-ray is reviewed. We will review the cytology, pathology results with the patient when they become available. Outpatient followup will be with Josephine Igo, DO.   Josephine Igo, DO  Pulmonary Critical Care 05/25/2023 1:39 PM

## 2023-05-25 NOTE — Interval H&P Note (Signed)
History and Physical Interval Note:  05/25/2023 12:33 PM  Katie Waller  has presented today for surgery, with the diagnosis of lung nodule.  The various methods of treatment have been discussed with the patient and family. After consideration of risks, benefits and other options for treatment, the patient has consented to  Procedure(s): ROBOTIC ASSISTED NAVIGATIONAL BRONCHOSCOPY (Left) as a surgical intervention.  The patient's history has been reviewed, patient examined, no change in status, stable for surgery.  I have reviewed the patient's chart and labs.  Questions were answered to the patient's satisfaction.     [name]

## 2023-05-25 NOTE — Transfer of Care (Signed)
Immediate Anesthesia Transfer of Care Note  Patient: Katie Waller  Procedure(s) Performed: ROBOTIC ASSISTED NAVIGATIONAL BRONCHOSCOPY (Left) BRONCHIAL NEEDLE ASPIRATION BIOPSIES BRONCHIAL BIOPSIES FIDUCIAL MARKER PLACEMENT  Patient Location: PACU  Anesthesia Type:General  Level of Consciousness: drowsy and patient cooperative  Airway & Oxygen Therapy: Patient Spontanous Breathing and Patient connected to nasal cannula oxygen  Post-op Assessment: Report given to RN, Post -op Vital signs reviewed and stable, and Patient moving all extremities X 4  Post vital signs: Reviewed and stable  Last Vitals:  Vitals Value Taken Time  BP 119/71 05/25/23 1345  Temp    Pulse 100 05/25/23 1347  Resp 19 05/25/23 1347  SpO2 98 % 05/25/23 1347  Vitals shown include unfiled device data.  Last Pain:  Vitals:   05/25/23 1058  TempSrc:   PainSc: 0-No pain         Complications: No notable events documented.

## 2023-05-26 LAB — CYTOLOGY - NON PAP

## 2023-05-26 NOTE — Anesthesia Postprocedure Evaluation (Signed)
Anesthesia Post Note  Patient: Katie Waller  Procedure(s) Performed: ROBOTIC ASSISTED NAVIGATIONAL BRONCHOSCOPY (Left) BRONCHIAL NEEDLE ASPIRATION BIOPSIES BRONCHIAL BIOPSIES FIDUCIAL MARKER PLACEMENT     Patient location during evaluation: PACU Anesthesia Type: General Level of consciousness: awake Pain management: pain level controlled Vital Signs Assessment: post-procedure vital signs reviewed and stable Respiratory status: spontaneous breathing, nonlabored ventilation and respiratory function stable Cardiovascular status: blood pressure returned to baseline and stable Postop Assessment: no apparent nausea or vomiting Anesthetic complications: no   No notable events documented.  Last Vitals:  Vitals:   05/25/23 1415 05/25/23 1430  BP: (!) 140/63 135/69  Pulse: (!) 113 (!) 111  Resp: (!) 28 20  Temp:  36.5 C  SpO2: 93% 100%    Last Pain:  Vitals:   05/25/23 1345  TempSrc:   PainSc: 0-No pain   Pain Goal:                   Catheryn Bacon Aurel Nguyen

## 2023-05-28 ENCOUNTER — Encounter (HOSPITAL_COMMUNITY): Payer: Self-pay | Admitting: Pulmonary Disease

## 2023-05-29 NOTE — Progress Notes (Signed)
Patient has appt tomorrow to discuss pathology with SG   Thanks,  BLI  Josephine Igo, DO [facility] 05/29/2023 3:38 PM

## 2023-05-29 NOTE — Progress Notes (Signed)
Ct reviewed prior to bronchoscopy    Thanks,  BLI  Josephine Igo, DO [place] [facility] 05/29/2023 3:44 PM

## 2023-05-30 ENCOUNTER — Ambulatory Visit: Payer: 59 | Admitting: Acute Care

## 2023-05-30 ENCOUNTER — Encounter: Payer: Self-pay | Admitting: Acute Care

## 2023-05-30 VITALS — BP 120/66 | HR 110 | Temp 98.6°F | Ht 61.0 in | Wt 158.8 lb

## 2023-05-30 DIAGNOSIS — C3432 Malignant neoplasm of lower lobe, left bronchus or lung: Secondary | ICD-10-CM | POA: Diagnosis not present

## 2023-05-30 DIAGNOSIS — Z9889 Other specified postprocedural states: Secondary | ICD-10-CM

## 2023-05-30 DIAGNOSIS — J069 Acute upper respiratory infection, unspecified: Secondary | ICD-10-CM | POA: Diagnosis not present

## 2023-05-30 DIAGNOSIS — J441 Chronic obstructive pulmonary disease with (acute) exacerbation: Secondary | ICD-10-CM

## 2023-05-30 MED ORDER — AZITHROMYCIN 250 MG PO TABS
ORAL_TABLET | ORAL | 0 refills | Status: DC
Start: 2023-05-30 — End: 2023-11-02

## 2023-05-30 NOTE — Progress Notes (Signed)
History of Present Illness Katie Waller is a 77 y.o. female former smoker with past medical history of chronic diastolic heart failure, hypertension, chronic hypoxemic respiratory failure baseline 4 L nasal cannula, followed through the lung cancer screening program.    Synopsis 77 year old female, past medical history of chronic diastolic heart failure, hypertension, chronic hypoxemic respiratory failure baseline 4 L nasal cannula. She is a former smoker. She had lung cancer screening CT last year and a follow-up CT scan this year that shows a new left lower lobe nodule. Nuclear medicine pet image was obtained which shows hypermetabolic uptake concerning for primary malignancy. She underwent  Flexible video fiberoptic bronchoscopy with robotic assistance and biopsies on 05/25/2023. She is here today for follow up after the procedure and to review cytology results.   05/30/2023 Pt. Presents for follow up after Flexible video fiberoptic bronchoscopy with robotic assistance and biopsies on 05/25/2023. She states she has been doing well after the procedure. She states she had a cough for about 48 hours after procedure. This was more than her baseline. She had to increase her oxygen to 6 L for the day after, then they have subsequently been able to decrease this to 5 L. Her baseline is 4.5 L. No discolored secretions, but increased chest congestion compared to her baseline . Mucus is clear.  Despite this I am going to treat patient with a Z-Pak just to ensure she does not have an infection growing after procedure. We have reviewed her cytology results.  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  I have reviewed this with both the patient and her daughter.  We discussed that pulmonary function test that were done in 2019 showed a severely reduced diffusion capacity and with her past medical history of diastolic heart failure and chronic respiratory failure at baseline she is  most likely not a candidate for surgery.  Plan will be to refer her to radiation oncology for evaluation and treatment. I reviewed and answered patient and daughter's questions.  They understand they will get a call to schedule consultation with a radiation oncologist.   Test Results: Cytology 05/25/2023 A. LUNG, LLL, FINE NEEDLE ASPIRATION:    FINAL MICROSCOPIC DIAGNOSIS:  - Squamous cell carcinoma      Latest Ref Rng & Units 05/25/2023   10:41 AM 12/24/2018    6:01 PM 07/06/2018    2:25 PM  CBC  WBC 4.0 - 10.5 K/uL 7.8  11.3  9.0   Hemoglobin 12.0 - 15.0 g/dL 78.2  95.6  21.3   Hematocrit 36.0 - 46.0 % 35.9  38.2  37.9   Platelets 150 - 400 K/uL 340  418  412.0        Latest Ref Rng & Units 05/25/2023   10:41 AM 12/24/2018    6:01 PM 08/07/2017    3:26 AM  BMP  Glucose 70 - 99 mg/dL 086  578    BUN 8 - 23 mg/dL 16  23    Creatinine 4.69 - 1.00 mg/dL 6.29  5.28  4.13   Sodium 135 - 145 mmol/L 139  140    Potassium 3.5 - 5.1 mmol/L 4.3  3.9    Chloride 98 - 111 mmol/L 99  102    CO2 22 - 32 mmol/L 29  29    Calcium 8.9 - 10.3 mg/dL 9.5  9.3      BNP    Component Value Date/Time   BNP 84.9 07/24/2017 1100  ProBNP    Component Value Date/Time   PROBNP 151 05/08/2020 1020    PFT    Component Value Date/Time   FEV1PRE 0.56 10/02/2017 0903   FEV1POST 0.73 10/02/2017 0903   FVCPRE 1.33 10/02/2017 0903   FVCPOST 1.62 10/02/2017 0903   TLC 5.97 10/02/2017 0903   DLCOUNC 6.70 10/02/2017 0903   PREFEV1FVCRT 42 10/02/2017 0903   PSTFEV1FVCRT 45 10/02/2017 0903    DG CHEST PORT 1 VIEW  Result Date: 05/25/2023 CLINICAL DATA:  Status post bronchoscopy EXAM: PORTABLE CHEST 1 VIEW COMPARISON:  PET CT 05/15/2023 FINDINGS: Interstitial thickening noted diffusely likely related to underlying emphysema, better seen on prior CT examination of 05/23/2023. Known pulmonary nodules are not well appreciated on this examination. No pneumothorax or pleural effusion. Cardiac size  within normal limits. Pulmonary vascularity is normal. New fiducial marker noted within the left hilum. No acute bone abnormality. IMPRESSION: 1. Emphysema. 2. No pneumothorax. Electronically Signed   By: Helyn Numbers M.D.   On: 05/25/2023 16:11   DG C-ARM BRONCHOSCOPY  Result Date: 05/25/2023 C-ARM BRONCHOSCOPY: Fluoroscopy was utilized by the requesting physician.  No radiographic interpretation.   CT Super D Chest Wo Contrast  Result Date: 05/24/2023 CLINICAL DATA:  Hypermetabolic superior segment left lower lobe pulmonary nodule. Biopsy pending. * Tracking Code: BO * EXAM: CT CHEST WITHOUT CONTRAST TECHNIQUE: Multidetector CT imaging of the chest was performed using thin slice collimation for electromagnetic bronchoscopy planning purposes, without intravenous contrast. RADIATION DOSE REDUCTION: This exam was performed according to the departmental dose-optimization program which includes automated exposure control, adjustment of the mA and/or kV according to patient size and/or use of iterative reconstruction technique. COMPARISON:  05/15/2023 PET-CT.  04/21/2023 screening chest CT. FINDINGS: Cardiovascular: Normal heart size. Small pericardial effusion is similar. Three-vessel coronary atherosclerosis. Atherosclerotic nonaneurysmal thoracic aorta. Dilated main pulmonary artery (3.5 cm diameter). Mediastinum/Nodes: No significant thyroid nodules. Unremarkable esophagus. No pathologically enlarged axillary, mediastinal or hilar lymph nodes, noting limited sensitivity for the detection of hilar adenopathy on this noncontrast study. Lungs/Pleura: No pneumothorax. No pleural effusion. Severe centrilobular emphysema with mild diffuse bronchial wall thickening. Irregular solid 1.8 x 1.5 cm superior segment left lower lobe pulmonary nodule (series 5/image 63), mildly increased from 1.6 x 1.3 cm on 04/21/2023 CT. Solid posterior right upper lobe 1.1 x 0.9 cm pulmonary nodule (series 5/image 46), decreased from  1.5 x 1.2 cm on 04/21/2023 CT. A few additional scattered small solid bilateral pulmonary nodules up to 0.4 cm in left upper lobe (series 5/image 28), all stable from 04/21/2023 CT. No acute consolidative airspace disease or new significant pulmonary nodules. Upper abdomen: Several small simple cysts scattered throughout the liver, largest 1.4 cm in the posterior left liver. Mild left colonic diverticulosis. Musculoskeletal: No aggressive appearing focal osseous lesions. Mild thoracic spondylosis. IMPRESSION: 1. Irregular solid 1.8 cm superior segment left lower lobe pulmonary nodule, mildly increased from 1.6 cm on 04/21/2023 CT and hypermetabolic on recent PET-CT, compatible with primary bronchogenic malignancy. 2. Solid posterior right upper lobe 1.1 cm pulmonary nodule, mildly decreased from 1.5 cm on 04/21/2023 CT, favoring benign inflammatory etiology. Recommend attention on follow-up chest CT in 3-6 months. 3. No thoracic adenopathy. 4. Three-vessel coronary atherosclerosis. 5. Dilated main pulmonary artery, suggesting pulmonary arterial hypertension. 6. Small pericardial effusion, similar. 7. Severe centrilobular emphysema with mild diffuse bronchial wall thickening, suggesting COPD. 8. Mild left colonic diverticulosis. 9. Aortic Atherosclerosis (ICD10-I70.0) and Emphysema (ICD10-J43.9). Electronically Signed   By: Delbert Phenix M.D.   On:  05/24/2023 20:43   NM PET Image Initial (PI) Skull Base To Thigh  Result Date: 05/24/2023 CLINICAL DATA:  Initial treatment strategy for enlarging superior segment left lower lobe pulmonary nodule and new right upper lobe pulmonary nodule on recent screening chest CT. EXAM: NUCLEAR MEDICINE PET SKULL BASE TO THIGH TECHNIQUE: 7.9 mCi F-18 FDG was injected intravenously. Full-ring PET imaging was performed from the skull base to thigh after the radiotracer. CT data was obtained and used for attenuation correction and anatomic localization. Fasting blood glucose: 95 mg/dl  COMPARISON:  09/07/7251 screening chest CT. FINDINGS: Mediastinal blood pool activity: SUV max 2.5 Liver activity: SUV max NA NECK: No hypermetabolic lymph nodes in the neck. Incidental CT findings: None. CHEST: Intensely hypermetabolic solid 1.7 x 1.2 cm superior segment left lower lobe pulmonary nodule with max SUV 14.5 (series 7/image 33). Posterior right upper lobe 1.1 x 1.0 cm solid pulmonary nodule demonstrates low level FDG uptake with max SUV 2.4 (series 7/image 23), appearing slightly decreased from 1.3 x 1.3 cm on 04/21/2023 chest CT. No additional hypermetabolic pulmonary findings. No enlarged or hypermetabolic hilar, mediastinal or axillary lymph nodes. Incidental CT findings: Severe centrilobular emphysema. Tiny 0.4 cm peripheral right middle lobe nodule is unchanged and below PET resolution (series 7/image 32). Atherosclerotic nonaneurysmal thoracic aorta. Coronary atherosclerosis. ABDOMEN/PELVIS: No abnormal hypermetabolic activity within the liver, pancreas, adrenal glands, or spleen. No hypermetabolic lymph nodes in the abdomen or pelvis. Incidental CT findings: A few small scattered simple liver cysts, largest 1.2 cm posteriorly in the left liver. Moderate diffuse colonic diverticulosis. Small degenerated calcified subcentimeter uterine fibroids. Atherosclerotic nonaneurysmal abdominal aorta. SKELETON: No focal hypermetabolic activity to suggest skeletal metastasis. Incidental CT findings: None. IMPRESSION: 1. Intensely hypermetabolic solid 1.7 cm superior segment left lower lobe pulmonary nodule, compatible with primary bronchogenic malignancy. 2. Posterior right upper lobe 1.1 cm solid pulmonary nodule demonstrates low level FDG uptake just below the at of the mediastinal blood pool, appearing slightly decreased in size since 04/21/2023 chest CT, favoring a benign etiology. Recommend attention on follow-up chest CT in 3-6 months. 3. No hypermetabolic thoracic adenopathy or distant hypermetabolic  metastatic disease. 4. Aortic Atherosclerosis (ICD10-I70.0) and Emphysema (ICD10-J43.9). Electronically Signed   By: Delbert Phenix M.D.   On: 05/24/2023 19:57   ECHOCARDIOGRAM COMPLETE  Result Date: 05/19/2023    ECHOCARDIOGRAM REPORT   Patient Name:   DLISA KALDENBERG Date of Exam: 05/19/2023 Medical Rec #:  [id number]        Height:       61.0 in Accession #:    [id number]       Weight:       158.0 lb Date of Birth:  1945-09-24       BSA:          1.709 m Patient Age:    76 years         BP:           116/64 mmHg Patient Gender: F                HR:           100 bpm. Exam Location:  [address] Procedure: 2D Echo, 3D Echo, Cardiac Doppler and Color Doppler Indications:    R06.00 Dyspnea  History:        Patient has prior history of Echocardiogram examinations, most                 recent 04/24/2020. Abnormal ECG, COPD and TIA,  Signs/Symptoms:Edema; Risk Factors:Hypertension, Dyslipidemia,                 Diabetes and Former Smoker. 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  Sonographer:    Farrel Conners RDCS Referring Phys: [name]  Sonographer Comments: Technically difficult study due to poor echo windows. IMPRESSIONS  1. Left ventricular ejection fraction, by estimation, is 50 to 55%. Left ventricular ejection fraction by 3D volume is 53 %. The left ventricle has low normal function. The left ventricle has no regional wall motion abnormalities. There is mild left ventricular hypertrophy. Left ventricular diastolic parameters are consistent with Grade I diastolic dysfunction (impaired relaxation).  2. Right ventricular systolic function is normal. The right ventricular size is normal.  3. The mitral valve is grossly normal. Trivial mitral valve regurgitation.  4. The aortic valve was not well visualized. Aortic valve regurgitation is trivial. Aortic valve sclerosis/calcification is present, without any evidence of aortic  stenosis.  5. The inferior vena cava is normal in size with greater than 50% respiratory variability, suggesting right atrial pressure of 3 mmHg. Comparison(s): Prior images unable to be directly viewed, comparison made by report only. Changes from prior study are noted. 04/24/2020: LVEF 55-60%. FINDINGS  Left Ventricle: Left ventricular ejection fraction, by estimation, is 50 to 55%. Left ventricular ejection fraction by 3D volume is 53 %. The left ventricle has low normal function. The left ventricle has no regional wall motion abnormalities. The left ventricular internal cavity size was normal in size. There is mild left ventricular hypertrophy. Left ventricular diastolic parameters are consistent with Grade I diastolic dysfunction (impaired relaxation). Indeterminate filling pressures. Right Ventricle: The right ventricular size is normal. No increase in right ventricular wall thickness. Right ventricular systolic function is normal. Left Atrium: Left atrial size was normal in size. Right Atrium: Right atrial size was normal in size. Pericardium: There is no evidence of pericardial effusion. Presence of epicardial fat layer. Mitral Valve: The mitral valve is grossly normal. Trivial mitral valve regurgitation. Tricuspid Valve: The tricuspid valve is normal in structure. Tricuspid valve regurgitation is not demonstrated. Aortic Valve: The aortic valve was not well visualized. Aortic valve regurgitation is trivial. Aortic regurgitation PHT measures 224 msec. Aortic valve sclerosis/calcification is present, without any evidence of aortic stenosis. Pulmonic Valve: The pulmonic valve was not well visualized. Pulmonic valve regurgitation is not visualized. Aorta: The aortic root and ascending aorta are structurally normal, with no evidence of dilitation. Venous: The inferior vena cava is normal in size with greater than 50% respiratory variability, suggesting right atrial pressure of 3 mmHg. IAS/Shunts: No atrial level  shunt detected by color flow Doppler.  LEFT VENTRICLE PLAX 2D LVIDd:         3.95 cm         Diastology LVIDs:         2.25 cm         LV e' medial:    6.22 cm/s LV PW:         0.90 cm         LV E/e' medial:  7.1 LV IVS:        0.80 cm         LV e' lateral:   8.39 cm/s LVOT diam:     2.40 cm         LV E/e' lateral: 5.2 LV  SV:         61 LV SV Index:   36 LVOT Area:     4.52 cm        3D Volume EF                                LV 3D EF:    Left                                             ventricul                                             ar                                             ejection                                             fraction                                             by 3D                                             volume is                                             53 %.                                 3D Volume EF:                                3D EF:        53 %                                LV EDV:       76 ml                                LV ESV:       36 ml                                LV SV:        40 ml RIGHT VENTRICLE RV Basal diam:  2.80 cm RV  S prime:     15.10 cm/s TAPSE (M-mode): 1.9 cm LEFT ATRIUM           Index        RIGHT ATRIUM           Index LA diam:      3.25 cm 1.90 cm/m   RA Pressure: 3.00 mmHg LA Vol (A2C): 43.4 ml 25.40 ml/m  RA Area:     12.20 cm LA Vol (A4C): 21.4 ml 12.52 ml/m  RA Volume:   26.60 ml  15.57 ml/m  AORTIC VALVE LVOT Vmax:   81.77 cm/s LVOT Vmean:  55.900 cm/s LVOT VTI:    0.134 m AI PHT:      224 msec  AORTA Ao Root diam: 3.10 cm Ao Asc diam:  3.40 cm MITRAL VALVE               TRICUSPID VALVE MV Area (PHT)  cm         Estimated RAP:  3.00 mmHg MV Decel Time: 150 msec MV E velocity: 44.03 cm/s  SHUNTS MV A velocity: 79.13 cm/s  Systemic VTI:  0.13 m MV E/A ratio:  0.56        Systemic Diam: 2.40 cm Katie Shutter MD Electronically signed by Katie Shutter MD Signature Date/Time: 05/19/2023/4:25:47 PM    Final      Past medical  hx Past Medical History:  Diagnosis Date   Abnormal finding on EKG 09/19/2013   Acute on chronic respiratory failure with hypercapnia (HCC)    Acute respiratory failure (HCC) 06/08/2016   Anxiety 11/09/2020   Arthritis    Asthma    Carotid bruit 11/09/2020   Chronic diastolic CHF (congestive heart failure) (HCC) 07/28/2017   Chronic obstructive pulmonary disease, unspecified (HCC) 11/09/2020   COPD, group D, by GOLD 2017 classification (HCC) 09/17/2013   Cough 11/09/2020   Decreased estrogen level 11/09/2020   Diabetes (HCC)    Dyspnea    Edema 11/09/2020   Essential hypertension 09/18/2013   Goals of care, counseling/discussion    Hardening of the aorta (main artery of the heart) (HCC) 11/09/2020   Heart disease    Hypertension    Hypertensive heart failure (HCC) 11/09/2020   Hypoxia 11/09/2020   Insomnia 11/09/2020   Iron deficiency anemia 11/09/2020   Large liver 11/09/2020   Near syncope 09/17/2013   Osteoporosis 11/09/2020   Palliative care encounter    Prediabetes 09/19/2013   Pure hypercholesterolemia 11/09/2020   Raynaud's disease 11/09/2020   Sinus tachycardia 09/18/2013   Skin sensation disturbance 11/09/2020   Smoker 09/17/2013   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 Her smoking use included cigarettes. She started smoking about 5 years ago. She has a 55 pack-year smoking history. She has never used smokeless tobacco. She  reports that she does not drink alcohol and does  not use drugs.  Tobacco Cessation: Quit 2018 with a 55-pack-year smoking history.   Past surgical hx, Family hx, Social hx all reviewed.  Current Outpatient Medications on File Prior to Visit  Medication Sig   acetaminophen (TYLENOL) 500 MG tablet Take 1,000 mg by mouth every 6 (six) hours as needed for moderate pain.   albuterol (PROVENTIL HFA;VENTOLIN HFA) 108 (90 BASE) MCG/ACT inhaler Inhale 2 puffs into the lungs every 6 (six) hours as needed for wheezing or shortness of breath.   amLODipine (NORVASC) 5 MG tablet Take 5 mg by mouth every morning.   aspirin 81 MG chewable tablet Chew 1 tablet (81 mg total) by mouth daily.   atorvastatin (LIPITOR) 10 MG tablet TAKE 1 TABLET BY MOUTH EVERY DAY   benzonatate (TESSALON) 100 MG capsule Take 100 mg by mouth 3 (three) times daily as needed for cough.    Biotin 10 MG CAPS Take 10 mg by mouth daily.   budesonide-formoterol (SYMBICORT) 160-4.5 MCG/ACT inhaler Inhale 2 puffs into the lungs 2 (two) times daily.   calcium carbonate (OSCAL) 1500 (600 Ca) MG TABS tablet Take 600 mg of elemental calcium by mouth daily.   Cholecalciferol (VITAMIN D) 50 MCG (2000 UT) tablet Take 2,000 Units by mouth daily.   furosemide (LASIX) 40 MG tablet Take 40 mg by mouth daily.   gabapentin (NEURONTIN) 300 MG capsule Take 900 mg by mouth at bedtime.   iron polysaccharides (NIFEREX) 150 MG capsule Take 150 mg by mouth daily.   LORazepam (ATIVAN) 1 MG tablet Take 1 tablet (1 mg total) by mouth every 6 (six) hours as needed for anxiety.   losartan (COZAAR) 50 MG tablet Take 50 mg by mouth daily.   Multiple Vitamin (MULTIVITAMIN WITH MINERALS) TABS tablet Take 1 tablet by mouth daily.   raloxifene (EVISTA) 60 MG tablet Take 60 mg by mouth every morning.   Tiotropium Bromide Monohydrate (SPIRIVA RESPIMAT) 2.5 MCG/ACT AERS Inhale 1 puff into the lungs daily.   traZODone (DESYREL) 100 MG tablet Take 1 tablet (100 mg total)  by mouth at bedtime as needed for sleep. (Patient taking differently: Take 100 mg by mouth at bedtime.)   No current facility-administered medications on file prior to visit.     Allergies  Allergen Reactions   Ace Inhibitors Swelling   Diphenhydramine Other (See Comments)    Hyperactive/jittery    Doxycycline Hyclate Nausea And Vomiting   Codeine Nausea And Vomiting    Review Of Systems:  Constitutional:   No  weight loss, night sweats,  Fevers, chills, fatigue, or  lassitude.  HEENT:   No headaches,  Difficulty swallowing,  Tooth/dental problems, or  Sore throat,                No sneezing, itching, ear ache, nasal congestion, post nasal drip,   CV:  No chest pain,  Orthopnea, PND, swelling in lower extremities, anasarca, dizziness, palpitations, syncope.   GI  No heartburn, indigestion, abdominal pain, nausea, vomiting, diarrhea, change in bowel habits, loss of appetite, bloody stools.   Resp: Positive shortness of breath with exertion less at rest.  Positive excess mucus, positive productive cough,  No non-productive cough,  No coughing up of blood.  No change in color of mucus.  No wheezing.  No chest wall deformity  Skin: no rash or lesions.  GU: no dysuria, change in color of urine, no urgency or frequency.  No flank pain, no hematuria   MS:  No joint pain or swelling.  No decreased range of motion.  No back pain.  Psych:  No change in mood or affect. No depression or anxiety.  No memory loss.   Vital Signs BP 120/66 (BP Location: Right Arm, Patient Position: Sitting, Cuff Size: Normal)   Pulse (!) 110   Temp 98.6 F (37 C) (Oral)   Ht 5\' 1"  (1.549 m)   Wt 158 lb 12.8 oz (72 kg)   SpO2 95%   BMI 30.00 kg/m    Physical Exam:  General- No distress,  A&Ox3, pleasant and appropriate ENT: No sinus tenderness, TM clear, pale nasal mucosa, no oral exudate,no post nasal drip, no LAN Cardiac: S1, S2, regular rate and rhythm, no murmur Chest: No wheeze/ rales/  dullness; no accessory muscle use, no nasal flaring, no sternal retractions, breath sounds in general are distant and diminished in her bases. Abd.: Soft Non-tender, nondistended, bowel sounds positive,Body mass index is 30 kg/m.  Ext: No clubbing cyanosis, no edema Neuro: Physically deconditioned, moving all extremities x 4, alert and oriented x 3, currently in a wheelchair Skin: No rashes, warm and dry, no lesions Psych: normal mood and behavior   Assessment/Plan New diagnosis squamous cell lung cancer in a former smoker. Found on lung cancer screening Plan Your biopsy was positive for squamous cell lung cancer.  I have referred you to radiation oncology. You will get a call to get this appointment scheduled.  They will take great care of you.  I have sent in a prescription for a z pack to ensure you get back to your baseline  Call us if you do not start to feel better.  Please call us if there is anything that you need.  Please contact office for sooner follow up if symptoms do not improve or worsen or seek emergency care    I spent 30 minutes dedicated to the care of this patient on the date of this encounter to include pre-visit review of records, face-to-face time with the patient discussing conditions above, post visit ordering of testing, clinical documentation with the electronic health record, making appropriate referrals as documented, and communicating necessary information to the patient's healthcare team.   Bevelyn Ngo, NP 05/30/2023  10:17 AM

## 2023-05-30 NOTE — Patient Instructions (Addendum)
It is good to see you today. Your biopsy was positive for squamous cell lung cancer.  I have referred you to radiation oncology. You will get a call to get this appointment scheduled.  They will take great care of you.  I have sent in a prescription for a z pack to ensure you get back to your baseline  Call us if you do not start to feel better.  Please call us if there is anything that you need.  Please contact office for sooner follow up if symptoms do not improve or worsen or seek emergency care

## 2023-06-01 ENCOUNTER — Other Ambulatory Visit: Payer: Self-pay

## 2023-06-01 NOTE — Progress Notes (Signed)
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 Myrlene Broker 05/18/23 note): "had lung cancer screening CT last year and a follow-up CT scan this year that shows a new left lower lobe nodule. Nuclear medicine pet image was obtained which shows hypermetabolic uptake concerning for primary malignancy."    CT Super D Chest w/o Contrast 05/23/2023 --IMPRESSION: Irregular solid 1.8 cm superior segment left lower lobe pulmonary nodule, mildly increased from 1.6 cm on 04/21/2023 CT and hypermetabolic on recent PET-CT, compatible with primary bronchogenic malignancy. Solid posterior right upper lobe 1.1 cm pulmonary nodule, mildly decreased from 1.5 cm on 04/21/2023 CT, favoring benign inflammatory etiology. Recommend attention on follow-up chest CT in 3-6 months. No thoracic adenopathy. Three-vessel coronary atherosclerosis. Dilated main pulmonary artery, suggesting pulmonary arterial hypertension. Small pericardial effusion, similar. Severe centrilobular emphysema with mild diffuse bronchial wall thickening, suggesting COPD. Mild left colonic diverticulosis. Aortic Atherosclerosis (ICD10-I70.0) and Emphysema (ICD10-J43.9)  PET Scan 05/15/2023 --IMPRESSION: Intensely hypermetabolic solid 1.7 cm superior segment left lower lobe pulmonary nodule, compatible with primary bronchogenic malignancy. Posterior right upper lobe 1.1 cm solid pulmonary nodule demonstrates low level FDG uptake just below the at of the mediastinal blood pool, appearing slightly decreased in size since 04/21/2023 chest CT, favoring a benign etiology. Recommend attention on follow-up chest CT in 3-6 months. No hypermetabolic thoracic adenopathy or distant hypermetabolic metastatic disease. Aortic Atherosclerosis (ICD10-I70.0) and Emphysema (ICD10-J43.9)  Biopsies revealed:  05/25/2023 A. LUNG, LLL, FINE NEEDLE ASPIRATION:  FINAL MICROSCOPIC DIAGNOSIS:  - Squamous  cell carcinoma   Tobacco/Marijuana/Snuff/ETOH use: Patient is a former smoker (quit in 2018). Denies any alcohol consumption or recreational drug use  Past/Anticipated interventions by cardiothoracic surgery, if any:  05/30/2023 (office visit) --Kandice Robinsons, NP Pt. Presents for follow up after Flexible video fiberoptic bronchoscopy with robotic assistance and biopsies on 05/25/2023. She states she has been doing well after the procedure. She states she had a cough for about 48 hours after procedure. This was more than her baseline. She had to increase her oxygen to 6 L for the day after, then they have subsequently been able to decrease this to 5 L. Her baseline is 4.5 L. No discolored secretions, but increased chest congestion compared to her baseline . Mucus is clear.  Despite this I am going to treat patient with a Z-Pak just to ensure she does not have an infection growing after procedure. We have reviewed her cytology results.  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  I have reviewed this with both the patient and her daughter.  We discussed that pulmonary function test that were done in 2019 showed a severely reduced diffusion capacity and with her past medical history of diastolic heart failure and chronic respiratory failure at baseline she is most likely not a candidate for surgery.  Plan will be to refer her to radiation oncology for evaluation and treatment. I reviewed and answered patient and daughter's questions.   05/24/2023 --Dr. Elige Radon Icard Video Bronchoscopy with Robotic Assisted Bronchoscopic Navigation    Past/Anticipated interventions by medical oncology, if any:  No referral at this time  Signs/Symptoms Weight changes, if any:  Wt Readings from Last 3 Encounters:  06/05/23 157 lb 2 oz (71.3 kg)  05/30/23 158 lb 12.8 oz (72 kg)  05/25/23 158 lb (71.7 kg)   Respiratory complaints, if any: Reports she developed a productive cough after her  recent biopsy (was prescribed PO antibiotics  by Pulmonology which she finished yesterday) Wears 4.5L-[place] chronically  Hemoptysis, if any: Denies Pain issues, if any:  Denies  SAFETY ISSUES: Prior radiation? No Pacemaker/ICD? No  Possible current pregnancy? No--postmenopausal Is the patient on methotrexate? No  Current Complaints / other details:  Nothing else of note

## 2023-06-02 NOTE — Progress Notes (Signed)
The proposed treatment discussed in conference is for discussion purpose only and is not a binding recommendation.  The patients have not been physically examined, or presented with their treatment options.  Therefore, final treatment plans cannot be decided.  

## 2023-06-04 NOTE — Progress Notes (Signed)
neck. Incidental CT findings: None. CHEST: Intensely hypermetabolic solid 1.7 x 1.2 cm superior segment left lower lobe pulmonary nodule with max SUV 14.5 (series 7/image 33). Posterior right upper lobe 1.1 x 1.0 cm solid pulmonary nodule demonstrates low level FDG uptake with max SUV 2.4 (series 7/image 23), appearing slightly decreased from 1.3 x 1.3 cm on 04/21/2023 chest CT. No additional hypermetabolic pulmonary findings. No enlarged or hypermetabolic hilar, mediastinal or axillary lymph nodes. Incidental CT findings: Severe centrilobular emphysema. Tiny 0.4 cm peripheral right middle lobe nodule is unchanged and below PET resolution (series 7/image 32). Atherosclerotic nonaneurysmal thoracic aorta. Coronary atherosclerosis. ABDOMEN/PELVIS: No abnormal hypermetabolic activity within the liver, pancreas, adrenal glands, or spleen. No hypermetabolic lymph nodes in the abdomen or pelvis. Incidental CT findings: A few small scattered simple liver cysts, largest 1.2 cm posteriorly in the left liver. Moderate diffuse colonic diverticulosis. Small  degenerated calcified subcentimeter uterine fibroids. Atherosclerotic nonaneurysmal abdominal aorta. SKELETON: No focal hypermetabolic activity to suggest skeletal metastasis. Incidental CT findings: None. IMPRESSION: 1. Intensely hypermetabolic solid 1.7 cm superior segment left lower lobe pulmonary nodule, compatible with primary bronchogenic malignancy. 2. Posterior right upper lobe 1.1 cm solid pulmonary nodule demonstrates low level FDG uptake just below the at of the mediastinal blood pool, appearing slightly decreased in size since 04/21/2023 chest CT, favoring a benign etiology. Recommend attention on follow-up chest CT in 3-6 months. 3. No hypermetabolic thoracic adenopathy or distant hypermetabolic metastatic disease. 4. Aortic Atherosclerosis (ICD10-I70.0) and Emphysema (ICD10-J43.9). Electronically Signed   By: Delbert Phenix M.D.   On: 05/24/2023 19:57   ECHOCARDIOGRAM COMPLETE  Result Date: 05/19/2023    ECHOCARDIOGRAM REPORT   Patient Name:   Katie Waller Date of Exam: 05/19/2023 Medical Rec #:  [id number]        Height:       61.0 in Accession #:    [id number]       Weight:       158.0 lb Date of Birth:  1945-10-06       BSA:          1.709 m Patient Age:    76 years         BP:           116/64 mmHg Patient Gender: F                HR:           100 bpm. Exam Location:  [address] Procedure: 2D Echo, 3D Echo, Cardiac Doppler and Color Doppler Indications:    R06.00 Dyspnea  History:        Patient has prior history of Echocardiogram examinations, most                 recent 04/24/2020. Abnormal ECG, COPD and TIA,                 Signs/Symptoms:Edema; Risk Factors:Hypertension, Dyslipidemia,                 Diabetes and Former Smoker. 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  Sonographer:    Farrel Conners RDCS Referring Phys: Deatra Doni Widmer  Sonographer Comments: Technically difficult study due to poor echo windows. IMPRESSIONS  1.  Left ventricular ejection fraction, by estimation, is 50 to 55%. Left ventricular ejection fraction by 3D volume is 53 %. The left ventricle  Radiation Oncology         [phone] ________________________________  Initial Outpatient Consultation  Name: Katie Waller MRN: [id number]  Date: 06/05/2023  DOB: 07-Mar-1946  CC:Sun, Charise Carwin, MD  Josephine Igo, DO   REFERRING PHYSICIAN: Josephine Igo, DO  DIAGNOSIS: {There were no encounter diagnoses. 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 female who is accompanied by ***. she is seen as a courtesy of Dr. Tonia Brooms for an opinion concerning radiation therapy as part of management for her recently diagnosed left lung cancer. She is a former smoker (55 pack year smoking history) and participates in the lung cancer screening program. She also has a past medical history of chronic diastolic heart failure, hypertension, chronic hypoxemic respiratory failure, and is on 4 L nasal cannula at baseline. Of note: Prior to her recent cancer diagnosis, her previous screening scans showed stable and benign appearing nodules bilaterally. Her history pertaining to her recent diagnosis of left lung cancer is detailed as follows.   The patient presented for a lung cancer screening CT on 04/21/23 which demonstrated: a substantial increase in size of a LLL nodule measuring 14/9 mm, previously 2 mm, and new nodule in the posterior RUL measuring 11.6 mm in mean diameter, and abutting the major fissure. The other bilateral nodules measuring up to 5.1 mm otherwise appeared unchanged.   PET scan on 05/15/23 further demonstrated intense hypermetabolism associated with the 1.7 cm LLL pulmonary nodule, concerning for primary bronchogenic carcinoma. The previously demonstrated RUL nodule showed low level FDG uptake and appeared to be slightly decreased in size since her screening CT, favoring a benign etiology. PET otherwise showed no evidence of hypermetabolic thoracic adenopathy or distant hypermetabolic  metastatic disease.  She was accordingly referred to Dr. Tonia Brooms and opted to proceed with bronchoscopy and FNA of the LLL on 05/25/23. Cytology showed findings consistent with squamous cell carcinoma.   Other pertinent imaging performed thus far includes a super-D chest CT on 05/23/23 which demonstrated: a mild increase in size of the LLL pulmonary nodule compatible with primary bronchogenic malignancy, measuring 1.8 cm, previously 1.6 cm on 08/16 CT, and a mild decrease in size of the RUL nodule, measuring 1.1 cm, previously 1.5 cm, favoring a benign etiology. CT also confirmed no evidence of thoracic adenopathy.   Of note: her case was recently presented at the tumor board held on 06/01/23.    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) 09/17/2013   Cough 11/09/2020   Decreased estrogen level 11/09/2020   Diabetes (HCC)    Dyspnea    Edema 11/09/2020   Essential hypertension 09/18/2013   Goals of care, counseling/discussion    Hardening of the aorta (main artery of the heart) (HCC) 11/09/2020   Heart disease    Hypertension    Hypertensive heart failure (HCC) 11/09/2020   Hypoxia 11/09/2020   Insomnia 11/09/2020   Iron deficiency anemia 11/09/2020   Large liver 11/09/2020  Radiation Oncology         [phone] ________________________________  Initial Outpatient Consultation  Name: Katie Waller MRN: [id number]  Date: 06/05/2023  DOB: 07-Mar-1946  CC:Sun, Charise Carwin, MD  Josephine Igo, DO   REFERRING PHYSICIAN: Josephine Igo, DO  DIAGNOSIS: {There were no encounter diagnoses. 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 female who is accompanied by ***. she is seen as a courtesy of Dr. Tonia Brooms for an opinion concerning radiation therapy as part of management for her recently diagnosed left lung cancer. She is a former smoker (55 pack year smoking history) and participates in the lung cancer screening program. She also has a past medical history of chronic diastolic heart failure, hypertension, chronic hypoxemic respiratory failure, and is on 4 L nasal cannula at baseline. Of note: Prior to her recent cancer diagnosis, her previous screening scans showed stable and benign appearing nodules bilaterally. Her history pertaining to her recent diagnosis of left lung cancer is detailed as follows.   The patient presented for a lung cancer screening CT on 04/21/23 which demonstrated: a substantial increase in size of a LLL nodule measuring 14/9 mm, previously 2 mm, and new nodule in the posterior RUL measuring 11.6 mm in mean diameter, and abutting the major fissure. The other bilateral nodules measuring up to 5.1 mm otherwise appeared unchanged.   PET scan on 05/15/23 further demonstrated intense hypermetabolism associated with the 1.7 cm LLL pulmonary nodule, concerning for primary bronchogenic carcinoma. The previously demonstrated RUL nodule showed low level FDG uptake and appeared to be slightly decreased in size since her screening CT, favoring a benign etiology. PET otherwise showed no evidence of hypermetabolic thoracic adenopathy or distant hypermetabolic  metastatic disease.  She was accordingly referred to Dr. Tonia Brooms and opted to proceed with bronchoscopy and FNA of the LLL on 05/25/23. Cytology showed findings consistent with squamous cell carcinoma.   Other pertinent imaging performed thus far includes a super-D chest CT on 05/23/23 which demonstrated: a mild increase in size of the LLL pulmonary nodule compatible with primary bronchogenic malignancy, measuring 1.8 cm, previously 1.6 cm on 08/16 CT, and a mild decrease in size of the RUL nodule, measuring 1.1 cm, previously 1.5 cm, favoring a benign etiology. CT also confirmed no evidence of thoracic adenopathy.   Of note: her case was recently presented at the tumor board held on 06/01/23.    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) 09/17/2013   Cough 11/09/2020   Decreased estrogen level 11/09/2020   Diabetes (HCC)    Dyspnea    Edema 11/09/2020   Essential hypertension 09/18/2013   Goals of care, counseling/discussion    Hardening of the aorta (main artery of the heart) (HCC) 11/09/2020   Heart disease    Hypertension    Hypertensive heart failure (HCC) 11/09/2020   Hypoxia 11/09/2020   Insomnia 11/09/2020   Iron deficiency anemia 11/09/2020   Large liver 11/09/2020  Radiation Oncology         [phone] ________________________________  Initial Outpatient Consultation  Name: Katie Waller MRN: [id number]  Date: 06/05/2023  DOB: 07-Mar-1946  CC:Sun, Charise Carwin, MD  Josephine Igo, DO   REFERRING PHYSICIAN: Josephine Igo, DO  DIAGNOSIS: {There were no encounter diagnoses. 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 female who is accompanied by ***. she is seen as a courtesy of Dr. Tonia Brooms for an opinion concerning radiation therapy as part of management for her recently diagnosed left lung cancer. She is a former smoker (55 pack year smoking history) and participates in the lung cancer screening program. She also has a past medical history of chronic diastolic heart failure, hypertension, chronic hypoxemic respiratory failure, and is on 4 L nasal cannula at baseline. Of note: Prior to her recent cancer diagnosis, her previous screening scans showed stable and benign appearing nodules bilaterally. Her history pertaining to her recent diagnosis of left lung cancer is detailed as follows.   The patient presented for a lung cancer screening CT on 04/21/23 which demonstrated: a substantial increase in size of a LLL nodule measuring 14/9 mm, previously 2 mm, and new nodule in the posterior RUL measuring 11.6 mm in mean diameter, and abutting the major fissure. The other bilateral nodules measuring up to 5.1 mm otherwise appeared unchanged.   PET scan on 05/15/23 further demonstrated intense hypermetabolism associated with the 1.7 cm LLL pulmonary nodule, concerning for primary bronchogenic carcinoma. The previously demonstrated RUL nodule showed low level FDG uptake and appeared to be slightly decreased in size since her screening CT, favoring a benign etiology. PET otherwise showed no evidence of hypermetabolic thoracic adenopathy or distant hypermetabolic  metastatic disease.  She was accordingly referred to Dr. Tonia Brooms and opted to proceed with bronchoscopy and FNA of the LLL on 05/25/23. Cytology showed findings consistent with squamous cell carcinoma.   Other pertinent imaging performed thus far includes a super-D chest CT on 05/23/23 which demonstrated: a mild increase in size of the LLL pulmonary nodule compatible with primary bronchogenic malignancy, measuring 1.8 cm, previously 1.6 cm on 08/16 CT, and a mild decrease in size of the RUL nodule, measuring 1.1 cm, previously 1.5 cm, favoring a benign etiology. CT also confirmed no evidence of thoracic adenopathy.   Of note: her case was recently presented at the tumor board held on 06/01/23.    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) 09/17/2013   Cough 11/09/2020   Decreased estrogen level 11/09/2020   Diabetes (HCC)    Dyspnea    Edema 11/09/2020   Essential hypertension 09/18/2013   Goals of care, counseling/discussion    Hardening of the aorta (main artery of the heart) (HCC) 11/09/2020   Heart disease    Hypertension    Hypertensive heart failure (HCC) 11/09/2020   Hypoxia 11/09/2020   Insomnia 11/09/2020   Iron deficiency anemia 11/09/2020   Large liver 11/09/2020  Radiation Oncology         [phone] ________________________________  Initial Outpatient Consultation  Name: Katie Waller MRN: [id number]  Date: 06/05/2023  DOB: 07-Mar-1946  CC:Sun, Charise Carwin, MD  Josephine Igo, DO   REFERRING PHYSICIAN: Josephine Igo, DO  DIAGNOSIS: {There were no encounter diagnoses. 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 female who is accompanied by ***. she is seen as a courtesy of Dr. Tonia Brooms for an opinion concerning radiation therapy as part of management for her recently diagnosed left lung cancer. She is a former smoker (55 pack year smoking history) and participates in the lung cancer screening program. She also has a past medical history of chronic diastolic heart failure, hypertension, chronic hypoxemic respiratory failure, and is on 4 L nasal cannula at baseline. Of note: Prior to her recent cancer diagnosis, her previous screening scans showed stable and benign appearing nodules bilaterally. Her history pertaining to her recent diagnosis of left lung cancer is detailed as follows.   The patient presented for a lung cancer screening CT on 04/21/23 which demonstrated: a substantial increase in size of a LLL nodule measuring 14/9 mm, previously 2 mm, and new nodule in the posterior RUL measuring 11.6 mm in mean diameter, and abutting the major fissure. The other bilateral nodules measuring up to 5.1 mm otherwise appeared unchanged.   PET scan on 05/15/23 further demonstrated intense hypermetabolism associated with the 1.7 cm LLL pulmonary nodule, concerning for primary bronchogenic carcinoma. The previously demonstrated RUL nodule showed low level FDG uptake and appeared to be slightly decreased in size since her screening CT, favoring a benign etiology. PET otherwise showed no evidence of hypermetabolic thoracic adenopathy or distant hypermetabolic  metastatic disease.  She was accordingly referred to Dr. Tonia Brooms and opted to proceed with bronchoscopy and FNA of the LLL on 05/25/23. Cytology showed findings consistent with squamous cell carcinoma.   Other pertinent imaging performed thus far includes a super-D chest CT on 05/23/23 which demonstrated: a mild increase in size of the LLL pulmonary nodule compatible with primary bronchogenic malignancy, measuring 1.8 cm, previously 1.6 cm on 08/16 CT, and a mild decrease in size of the RUL nodule, measuring 1.1 cm, previously 1.5 cm, favoring a benign etiology. CT also confirmed no evidence of thoracic adenopathy.   Of note: her case was recently presented at the tumor board held on 06/01/23.    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) 09/17/2013   Cough 11/09/2020   Decreased estrogen level 11/09/2020   Diabetes (HCC)    Dyspnea    Edema 11/09/2020   Essential hypertension 09/18/2013   Goals of care, counseling/discussion    Hardening of the aorta (main artery of the heart) (HCC) 11/09/2020   Heart disease    Hypertension    Hypertensive heart failure (HCC) 11/09/2020   Hypoxia 11/09/2020   Insomnia 11/09/2020   Iron deficiency anemia 11/09/2020   Large liver 11/09/2020  Radiation Oncology         [phone] ________________________________  Initial Outpatient Consultation  Name: Katie Waller MRN: [id number]  Date: 06/05/2023  DOB: 07-Mar-1946  CC:Sun, Charise Carwin, MD  Josephine Igo, DO   REFERRING PHYSICIAN: Josephine Igo, DO  DIAGNOSIS: {There were no encounter diagnoses. 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 female who is accompanied by ***. she is seen as a courtesy of Dr. Tonia Brooms for an opinion concerning radiation therapy as part of management for her recently diagnosed left lung cancer. She is a former smoker (55 pack year smoking history) and participates in the lung cancer screening program. She also has a past medical history of chronic diastolic heart failure, hypertension, chronic hypoxemic respiratory failure, and is on 4 L nasal cannula at baseline. Of note: Prior to her recent cancer diagnosis, her previous screening scans showed stable and benign appearing nodules bilaterally. Her history pertaining to her recent diagnosis of left lung cancer is detailed as follows.   The patient presented for a lung cancer screening CT on 04/21/23 which demonstrated: a substantial increase in size of a LLL nodule measuring 14/9 mm, previously 2 mm, and new nodule in the posterior RUL measuring 11.6 mm in mean diameter, and abutting the major fissure. The other bilateral nodules measuring up to 5.1 mm otherwise appeared unchanged.   PET scan on 05/15/23 further demonstrated intense hypermetabolism associated with the 1.7 cm LLL pulmonary nodule, concerning for primary bronchogenic carcinoma. The previously demonstrated RUL nodule showed low level FDG uptake and appeared to be slightly decreased in size since her screening CT, favoring a benign etiology. PET otherwise showed no evidence of hypermetabolic thoracic adenopathy or distant hypermetabolic  metastatic disease.  She was accordingly referred to Dr. Tonia Brooms and opted to proceed with bronchoscopy and FNA of the LLL on 05/25/23. Cytology showed findings consistent with squamous cell carcinoma.   Other pertinent imaging performed thus far includes a super-D chest CT on 05/23/23 which demonstrated: a mild increase in size of the LLL pulmonary nodule compatible with primary bronchogenic malignancy, measuring 1.8 cm, previously 1.6 cm on 08/16 CT, and a mild decrease in size of the RUL nodule, measuring 1.1 cm, previously 1.5 cm, favoring a benign etiology. CT also confirmed no evidence of thoracic adenopathy.   Of note: her case was recently presented at the tumor board held on 06/01/23.    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) 09/17/2013   Cough 11/09/2020   Decreased estrogen level 11/09/2020   Diabetes (HCC)    Dyspnea    Edema 11/09/2020   Essential hypertension 09/18/2013   Goals of care, counseling/discussion    Hardening of the aorta (main artery of the heart) (HCC) 11/09/2020   Heart disease    Hypertension    Hypertensive heart failure (HCC) 11/09/2020   Hypoxia 11/09/2020   Insomnia 11/09/2020   Iron deficiency anemia 11/09/2020   Large liver 11/09/2020  neck. Incidental CT findings: None. CHEST: Intensely hypermetabolic solid 1.7 x 1.2 cm superior segment left lower lobe pulmonary nodule with max SUV 14.5 (series 7/image 33). Posterior right upper lobe 1.1 x 1.0 cm solid pulmonary nodule demonstrates low level FDG uptake with max SUV 2.4 (series 7/image 23), appearing slightly decreased from 1.3 x 1.3 cm on 04/21/2023 chest CT. No additional hypermetabolic pulmonary findings. No enlarged or hypermetabolic hilar, mediastinal or axillary lymph nodes. Incidental CT findings: Severe centrilobular emphysema. Tiny 0.4 cm peripheral right middle lobe nodule is unchanged and below PET resolution (series 7/image 32). Atherosclerotic nonaneurysmal thoracic aorta. Coronary atherosclerosis. ABDOMEN/PELVIS: No abnormal hypermetabolic activity within the liver, pancreas, adrenal glands, or spleen. No hypermetabolic lymph nodes in the abdomen or pelvis. Incidental CT findings: A few small scattered simple liver cysts, largest 1.2 cm posteriorly in the left liver. Moderate diffuse colonic diverticulosis. Small  degenerated calcified subcentimeter uterine fibroids. Atherosclerotic nonaneurysmal abdominal aorta. SKELETON: No focal hypermetabolic activity to suggest skeletal metastasis. Incidental CT findings: None. IMPRESSION: 1. Intensely hypermetabolic solid 1.7 cm superior segment left lower lobe pulmonary nodule, compatible with primary bronchogenic malignancy. 2. Posterior right upper lobe 1.1 cm solid pulmonary nodule demonstrates low level FDG uptake just below the at of the mediastinal blood pool, appearing slightly decreased in size since 04/21/2023 chest CT, favoring a benign etiology. Recommend attention on follow-up chest CT in 3-6 months. 3. No hypermetabolic thoracic adenopathy or distant hypermetabolic metastatic disease. 4. Aortic Atherosclerosis (ICD10-I70.0) and Emphysema (ICD10-J43.9). Electronically Signed   By: Delbert Phenix M.D.   On: 05/24/2023 19:57   ECHOCARDIOGRAM COMPLETE  Result Date: 05/19/2023    ECHOCARDIOGRAM REPORT   Patient Name:   Katie Waller Date of Exam: 05/19/2023 Medical Rec #:  [id number]        Height:       61.0 in Accession #:    [id number]       Weight:       158.0 lb Date of Birth:  1945-10-06       BSA:          1.709 m Patient Age:    76 years         BP:           116/64 mmHg Patient Gender: F                HR:           100 bpm. Exam Location:  [address] Procedure: 2D Echo, 3D Echo, Cardiac Doppler and Color Doppler Indications:    R06.00 Dyspnea  History:        Patient has prior history of Echocardiogram examinations, most                 recent 04/24/2020. Abnormal ECG, COPD and TIA,                 Signs/Symptoms:Edema; Risk Factors:Hypertension, Dyslipidemia,                 Diabetes and Former Smoker. 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  Sonographer:    Farrel Conners RD[facility] Referring Phys: Deatra Doni Widmer  Sonographer Comments: Technically difficult study due to poor echo windows. IMPRESSIONS  1.  Left ventricular ejection fraction, by estimation, is 50 to 55%. Left ventricular ejection fraction by 3D volume is 53 %. The left ventricle  neck. Incidental CT findings: None. CHEST: Intensely hypermetabolic solid 1.7 x 1.2 cm superior segment left lower lobe pulmonary nodule with max SUV 14.5 (series 7/image 33). Posterior right upper lobe 1.1 x 1.0 cm solid pulmonary nodule demonstrates low level FDG uptake with max SUV 2.4 (series 7/image 23), appearing slightly decreased from 1.3 x 1.3 cm on 04/21/2023 chest CT. No additional hypermetabolic pulmonary findings. No enlarged or hypermetabolic hilar, mediastinal or axillary lymph nodes. Incidental CT findings: Severe centrilobular emphysema. Tiny 0.4 cm peripheral right middle lobe nodule is unchanged and below PET resolution (series 7/image 32). Atherosclerotic nonaneurysmal thoracic aorta. Coronary atherosclerosis. ABDOMEN/PELVIS: No abnormal hypermetabolic activity within the liver, pancreas, adrenal glands, or spleen. No hypermetabolic lymph nodes in the abdomen or pelvis. Incidental CT findings: A few small scattered simple liver cysts, largest 1.2 cm posteriorly in the left liver. Moderate diffuse colonic diverticulosis. Small  degenerated calcified subcentimeter uterine fibroids. Atherosclerotic nonaneurysmal abdominal aorta. SKELETON: No focal hypermetabolic activity to suggest skeletal metastasis. Incidental CT findings: None. IMPRESSION: 1. Intensely hypermetabolic solid 1.7 cm superior segment left lower lobe pulmonary nodule, compatible with primary bronchogenic malignancy. 2. Posterior right upper lobe 1.1 cm solid pulmonary nodule demonstrates low level FDG uptake just below the at of the mediastinal blood pool, appearing slightly decreased in size since 04/21/2023 chest CT, favoring a benign etiology. Recommend attention on follow-up chest CT in 3-6 months. 3. No hypermetabolic thoracic adenopathy or distant hypermetabolic metastatic disease. 4. Aortic Atherosclerosis (ICD10-I70.0) and Emphysema (ICD10-J43.9). Electronically Signed   By: Delbert Phenix M.D.   On: 05/24/2023 19:57   ECHOCARDIOGRAM COMPLETE  Result Date: 05/19/2023    ECHOCARDIOGRAM REPORT   Patient Name:   Katie Waller Date of Exam: 05/19/2023 Medical Rec #:  [id number]        Height:       61.0 in Accession #:    [id number]       Weight:       158.0 lb Date of Birth:  1945-10-06       BSA:          1.709 m Patient Age:    76 years         BP:           116/64 mmHg Patient Gender: F                HR:           100 bpm. Exam Location:  [address] Procedure: 2D Echo, 3D Echo, Cardiac Doppler and Color Doppler Indications:    R06.00 Dyspnea  History:        Patient has prior history of Echocardiogram examinations, most                 recent 04/24/2020. Abnormal ECG, COPD and TIA,                 Signs/Symptoms:Edema; Risk Factors:Hypertension, Dyslipidemia,                 Diabetes and Former Smoker. 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  Sonographer:    Farrel Conners RDCS Referring Phys: Deatra Doni Widmer  Sonographer Comments: Technically difficult study due to poor echo windows. IMPRESSIONS  1.  Left ventricular ejection fraction, by estimation, is 50 to 55%. Left ventricular ejection fraction by 3D volume is 53 %. The left ventricle

## 2023-06-05 ENCOUNTER — Ambulatory Visit
Admission: RE | Admit: 2023-06-05 | Discharge: 2023-06-05 | Disposition: A | Discharge status: Home / Self Care | Payer: 59 | Source: Ambulatory Visit | Attending: Radiation Oncology | Admitting: Radiation Oncology

## 2023-06-05 ENCOUNTER — Encounter: Payer: Self-pay | Admitting: Radiation Oncology

## 2023-06-05 VITALS — BP 129/66 | HR 108 | Temp 97.4°F | Resp 20 | Ht 61.0 in | Wt 157.1 lb

## 2023-06-05 DIAGNOSIS — R000 Tachycardia, unspecified: Secondary | ICD-10-CM | POA: Insufficient documentation

## 2023-06-05 DIAGNOSIS — Z9981 Dependence on supplemental oxygen: Secondary | ICD-10-CM | POA: Diagnosis not present

## 2023-06-05 DIAGNOSIS — J432 Centrilobular emphysema: Secondary | ICD-10-CM | POA: Insufficient documentation

## 2023-06-05 DIAGNOSIS — J9611 Chronic respiratory failure with hypoxia: Secondary | ICD-10-CM | POA: Diagnosis not present

## 2023-06-05 DIAGNOSIS — E7800 Pure hypercholesterolemia, unspecified: Secondary | ICD-10-CM | POA: Diagnosis not present

## 2023-06-05 DIAGNOSIS — Z87891 Personal history of nicotine dependence: Secondary | ICD-10-CM | POA: Diagnosis not present

## 2023-06-05 DIAGNOSIS — Z8673 Personal history of transient ischemic attack (TIA), and cerebral infarction without residual deficits: Secondary | ICD-10-CM | POA: Insufficient documentation

## 2023-06-05 DIAGNOSIS — J449 Chronic obstructive pulmonary disease, unspecified: Secondary | ICD-10-CM | POA: Insufficient documentation

## 2023-06-05 DIAGNOSIS — Z7951 Long term (current) use of inhaled steroids: Secondary | ICD-10-CM | POA: Diagnosis not present

## 2023-06-05 DIAGNOSIS — Z79899 Other long term (current) drug therapy: Secondary | ICD-10-CM | POA: Diagnosis not present

## 2023-06-05 DIAGNOSIS — I878 Other specified disorders of veins: Secondary | ICD-10-CM | POA: Insufficient documentation

## 2023-06-05 DIAGNOSIS — C3432 Malignant neoplasm of lower lobe, left bronchus or lung: Secondary | ICD-10-CM | POA: Insufficient documentation

## 2023-06-05 DIAGNOSIS — E119 Type 2 diabetes mellitus without complications: Secondary | ICD-10-CM | POA: Insufficient documentation

## 2023-06-05 DIAGNOSIS — R160 Hepatomegaly, not elsewhere classified: Secondary | ICD-10-CM | POA: Diagnosis not present

## 2023-06-05 DIAGNOSIS — G4700 Insomnia, unspecified: Secondary | ICD-10-CM | POA: Diagnosis not present

## 2023-06-05 DIAGNOSIS — R911 Solitary pulmonary nodule: Secondary | ICD-10-CM

## 2023-06-05 DIAGNOSIS — I700 Atherosclerosis of aorta: Secondary | ICD-10-CM | POA: Diagnosis not present

## 2023-06-05 DIAGNOSIS — I5032 Chronic diastolic (congestive) heart failure: Secondary | ICD-10-CM | POA: Diagnosis not present

## 2023-06-05 DIAGNOSIS — D509 Iron deficiency anemia, unspecified: Secondary | ICD-10-CM | POA: Diagnosis not present

## 2023-06-05 DIAGNOSIS — M47814 Spondylosis without myelopathy or radiculopathy, thoracic region: Secondary | ICD-10-CM | POA: Diagnosis not present

## 2023-06-05 DIAGNOSIS — M810 Age-related osteoporosis without current pathological fracture: Secondary | ICD-10-CM | POA: Diagnosis not present

## 2023-06-05 DIAGNOSIS — I130 Hypertensive heart and chronic kidney disease with heart failure and stage 1 through stage 4 chronic kidney disease, or unspecified chronic kidney disease: Secondary | ICD-10-CM | POA: Insufficient documentation

## 2023-06-05 DIAGNOSIS — I3139 Other pericardial effusion (noninflammatory): Secondary | ICD-10-CM | POA: Insufficient documentation

## 2023-06-05 DIAGNOSIS — I2510 Atherosclerotic heart disease of native coronary artery without angina pectoris: Secondary | ICD-10-CM | POA: Insufficient documentation

## 2023-06-06 ENCOUNTER — Ambulatory Visit
Admission: RE | Admit: 2023-06-06 | Discharge: 2023-06-06 | Disposition: A | Discharge status: Home / Self Care | Payer: 59 | Source: Ambulatory Visit | Attending: Radiation Oncology | Admitting: Radiation Oncology

## 2023-06-06 DIAGNOSIS — C3432 Malignant neoplasm of lower lobe, left bronchus or lung: Secondary | ICD-10-CM | POA: Insufficient documentation

## 2023-06-06 DIAGNOSIS — Z510 Encounter for antineoplastic radiation therapy: Secondary | ICD-10-CM | POA: Insufficient documentation

## 2023-06-06 DIAGNOSIS — Z87891 Personal history of nicotine dependence: Secondary | ICD-10-CM | POA: Diagnosis not present

## 2023-06-06 DIAGNOSIS — R918 Other nonspecific abnormal finding of lung field: Secondary | ICD-10-CM

## 2023-06-09 ENCOUNTER — Ambulatory Visit
Admission: RE | Admit: 2023-06-09 | Discharge: 2023-06-09 | Disposition: A | Discharge status: Home / Self Care | Payer: 59 | Source: Ambulatory Visit | Attending: Family Medicine | Admitting: Family Medicine

## 2023-06-09 DIAGNOSIS — Z1231 Encounter for screening mammogram for malignant neoplasm of breast: Secondary | ICD-10-CM

## 2023-06-09 DIAGNOSIS — J449 Chronic obstructive pulmonary disease, unspecified: Secondary | ICD-10-CM | POA: Diagnosis not present

## 2023-06-09 DIAGNOSIS — M8588 Other specified disorders of bone density and structure, other site: Secondary | ICD-10-CM | POA: Diagnosis not present

## 2023-06-09 DIAGNOSIS — N189 Chronic kidney disease, unspecified: Secondary | ICD-10-CM | POA: Diagnosis not present

## 2023-06-09 DIAGNOSIS — M810 Age-related osteoporosis without current pathological fracture: Secondary | ICD-10-CM

## 2023-06-15 DIAGNOSIS — Z510 Encounter for antineoplastic radiation therapy: Secondary | ICD-10-CM | POA: Diagnosis not present

## 2023-06-15 DIAGNOSIS — Z87891 Personal history of nicotine dependence: Secondary | ICD-10-CM | POA: Diagnosis not present

## 2023-06-15 DIAGNOSIS — H40022 Open angle with borderline findings, high risk, left eye: Secondary | ICD-10-CM | POA: Diagnosis not present

## 2023-06-15 DIAGNOSIS — H524 Presbyopia: Secondary | ICD-10-CM | POA: Diagnosis not present

## 2023-06-15 DIAGNOSIS — C3432 Malignant neoplasm of lower lobe, left bronchus or lung: Secondary | ICD-10-CM | POA: Diagnosis not present

## 2023-06-15 DIAGNOSIS — Z961 Presence of intraocular lens: Secondary | ICD-10-CM | POA: Diagnosis not present

## 2023-06-15 DIAGNOSIS — H01009 Unspecified blepharitis unspecified eye, unspecified eyelid: Secondary | ICD-10-CM | POA: Diagnosis not present

## 2023-06-15 DIAGNOSIS — H52221 Regular astigmatism, right eye: Secondary | ICD-10-CM | POA: Diagnosis not present

## 2023-06-18 DIAGNOSIS — J449 Chronic obstructive pulmonary disease, unspecified: Secondary | ICD-10-CM | POA: Diagnosis not present

## 2023-06-20 ENCOUNTER — Ambulatory Visit
Admission: RE | Admit: 2023-06-20 | Discharge: 2023-06-20 | Disposition: A | Discharge status: Home / Self Care | Payer: 59 | Source: Ambulatory Visit | Attending: Radiation Oncology | Admitting: Radiation Oncology

## 2023-06-20 ENCOUNTER — Other Ambulatory Visit: Payer: Self-pay

## 2023-06-20 DIAGNOSIS — R911 Solitary pulmonary nodule: Secondary | ICD-10-CM

## 2023-06-20 DIAGNOSIS — C3432 Malignant neoplasm of lower lobe, left bronchus or lung: Secondary | ICD-10-CM | POA: Diagnosis not present

## 2023-06-20 DIAGNOSIS — Z510 Encounter for antineoplastic radiation therapy: Secondary | ICD-10-CM | POA: Diagnosis not present

## 2023-06-20 LAB — RAD ONC ARIA SESSION SUMMARY
Course Elapsed Days: 0
Plan Fractions Treated to Date: 1
Plan Prescribed Dose Per Fraction: 18 Gy
Plan Total Fractions Prescribed: 3
Plan Total Prescribed Dose: 54 Gy
Reference Point Dosage Given to Date: 18 Gy
Reference Point Session Dosage Given: 18 Gy
Session Number: 1

## 2023-06-21 ENCOUNTER — Ambulatory Visit: Payer: 59 | Admitting: Radiation Oncology

## 2023-06-22 ENCOUNTER — Other Ambulatory Visit: Payer: Self-pay

## 2023-06-22 ENCOUNTER — Ambulatory Visit
Admission: RE | Admit: 2023-06-22 | Discharge: 2023-06-22 | Disposition: A | Discharge status: Home / Self Care | Payer: 59 | Source: Ambulatory Visit | Attending: Radiation Oncology | Admitting: Radiation Oncology

## 2023-06-22 DIAGNOSIS — Z510 Encounter for antineoplastic radiation therapy: Secondary | ICD-10-CM | POA: Diagnosis not present

## 2023-06-22 DIAGNOSIS — R911 Solitary pulmonary nodule: Secondary | ICD-10-CM

## 2023-06-22 DIAGNOSIS — C3432 Malignant neoplasm of lower lobe, left bronchus or lung: Secondary | ICD-10-CM | POA: Diagnosis not present

## 2023-06-22 LAB — RAD ONC ARIA SESSION SUMMARY
Course Elapsed Days: 2
Plan Fractions Treated to Date: 2
Plan Prescribed Dose Per Fraction: 18 Gy
Plan Total Fractions Prescribed: 3
Plan Total Prescribed Dose: 54 Gy
Reference Point Dosage Given to Date: 36 Gy
Reference Point Session Dosage Given: 18 Gy
Session Number: 2

## 2023-06-23 ENCOUNTER — Ambulatory Visit: Payer: 59 | Admitting: Radiation Oncology

## 2023-06-26 ENCOUNTER — Other Ambulatory Visit: Payer: Self-pay

## 2023-06-26 ENCOUNTER — Ambulatory Visit
Admission: RE | Admit: 2023-06-26 | Discharge: 2023-06-26 | Disposition: A | Discharge status: Home / Self Care | Payer: 59 | Source: Ambulatory Visit | Attending: Radiation Oncology | Admitting: Radiation Oncology

## 2023-06-26 DIAGNOSIS — R918 Other nonspecific abnormal finding of lung field: Secondary | ICD-10-CM

## 2023-06-26 DIAGNOSIS — C3432 Malignant neoplasm of lower lobe, left bronchus or lung: Secondary | ICD-10-CM | POA: Diagnosis not present

## 2023-06-26 DIAGNOSIS — Z510 Encounter for antineoplastic radiation therapy: Secondary | ICD-10-CM | POA: Diagnosis not present

## 2023-06-26 DIAGNOSIS — Z87891 Personal history of nicotine dependence: Secondary | ICD-10-CM | POA: Diagnosis not present

## 2023-06-26 LAB — RAD ONC ARIA SESSION SUMMARY
Course Elapsed Days: 6
Plan Fractions Treated to Date: 3
Plan Prescribed Dose Per Fraction: 18 Gy
Plan Total Fractions Prescribed: 3
Plan Total Prescribed Dose: 54 Gy
Reference Point Dosage Given to Date: 54 Gy
Reference Point Session Dosage Given: 18 Gy
Session Number: 3

## 2023-06-27 NOTE — Radiation Completion Notes (Signed)
Patient Name: Katie Waller, Katie Waller MRN: [id number] Date of Birth: 08-Jan-1946 Referring Physician: Audie Box, M.D. Date of Service: 2023-06-27 Radiation Oncologist: Arnette Schaumann, M.D. [facility]                             RADIATION ONCOLOGY END OF TREATMENT NOTE     Diagnosis: C34.32 Malignant neoplasm of lower lobe, left bronchus or lung Intent: Curative     ==========DELIVERED PLANS==========  First Treatment Date: 2023-06-20 - Last Treatment Date: 2023-06-26   Plan Name: Lung_L_SBRT Site: Lung, Left Technique: SBRT/SRT-IMRT Mode: Photon Dose Per Fraction: 18 Gy Prescribed Dose (Delivered / Prescribed): 54 Gy / 54 Gy Prescribed Fxs (Delivered / Prescribed): 3 / 3     ==========ON TREATMENT VISIT DATES========== 2023-06-20, 2023-06-22, 2023-06-26, 2023-06-26     ==========UPCOMING VISITS==========       ==========APPENDIX - ON TREATMENT VISIT NOTES==========   See weekly On Treatment Notes in Epic for details.

## 2023-06-28 ENCOUNTER — Other Ambulatory Visit: Payer: Self-pay | Admitting: Acute Care

## 2023-06-28 ENCOUNTER — Other Ambulatory Visit: Payer: Self-pay | Admitting: Pulmonary Disease

## 2023-06-28 ENCOUNTER — Ambulatory Visit: Payer: 59 | Admitting: Radiation Oncology

## 2023-06-28 ENCOUNTER — Ambulatory Visit: Payer: 59

## 2023-06-28 DIAGNOSIS — J069 Acute upper respiratory infection, unspecified: Secondary | ICD-10-CM

## 2023-06-30 ENCOUNTER — Ambulatory Visit: Payer: 59 | Admitting: Radiation Oncology

## 2023-07-19 DIAGNOSIS — J449 Chronic obstructive pulmonary disease, unspecified: Secondary | ICD-10-CM | POA: Diagnosis not present

## 2023-07-21 ENCOUNTER — Other Ambulatory Visit: Payer: Self-pay | Admitting: Cardiology

## 2023-07-26 ENCOUNTER — Encounter: Payer: Self-pay | Admitting: Radiation Oncology

## 2023-07-26 NOTE — Progress Notes (Signed)
  Radiation Oncology         [phone] ________________________________  Name: Katie Waller MRN: [id number]  Date: 07/27/2023  DOB: Feb 15, 1946  End of Treatment Note  Diagnosis: The primary encounter diagnosis was Malignant neoplasm of lower lobe of left lung (HCC). A diagnosis of Lung nodule was also pertinent to this visit.   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 On the date of her final treatment, the patient endorsed: pain beneath the left breast/left chest and left shoulder, fatigue, and shortness of breath (on 4L O2 at baseline). She denied any other concerns with radiation therapy.   Plan: The patient has completed radiation treatment. The patient will return to radiation oncology clinic for routine followup in one month. I advised them to call or return sooner if they have any questions or concerns related to their recovery or treatment.  -----------------------------------  Billie Lade, PhD, MD  This document serves as a record of services personally performed by Antony Blackbird, MD. It was created on his behalf by Neena Rhymes, a trained medical scribe. The creation of this record is based on the scribe's personal observations and the provider's statements to them. This document has been checked and approved by the attending provider.

## 2023-07-26 NOTE — Progress Notes (Signed)
Radiation Oncology         [phone] ________________________________  Name: Katie Waller MRN: [id number]  Date: 07/27/2023  DOB: 1946-02-10  Follow-Up Visit Note  CC: Deatra Contrell Ballentine, MD  Josephine Igo, DO  No diagnosis found.  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 She tolerated radiation treatment relatively well. On the date of her final treatment, the patient endorsed: pain beneath the left breast/left chest and left shoulder, fatigue, and shortness of breath (on 4L O2 at baseline). She denied any other concerns with radiation therapy.     Pertinent imaging performed in the interval since her consultation date includes:  -- DXA for bone density on 06/09/23 classified the patient as osteoporotic.  -- Bilateral screening mammogram on 06/09/23 showed no evidence of malignancy in either breast.             No significant interval history since she completed radiation therapy.   ***                 Allergies:  is allergic to ace inhibitors, diphenhydramine, doxycycline hyclate, and codeine.  Meds: Current Outpatient Medications  Medication Sig Dispense Refill   acetaminophen (TYLENOL) 500 MG tablet Take 1,000 mg by mouth every 6 (six) hours as needed for moderate pain.     albuterol (PROVENTIL HFA;VENTOLIN HFA) 108 (90 BASE) MCG/ACT inhaler Inhale 2 puffs into the lungs every 6 (six) hours as needed for wheezing or shortness of breath. 1 Inhaler 0   amLODipine (NORVASC) 5 MG tablet Take 5 mg by mouth  every morning.     aspirin 81 MG chewable tablet Chew 1 tablet (81 mg total) by mouth daily.     atorvastatin (LIPITOR) 10 MG tablet TAKE 1 TABLET BY MOUTH EVERY DAY 90 tablet 1   azithromycin (ZITHROMAX) 250 MG tablet Take 2 tablets today, then one tablet daily for the next 4 days. 6 tablet 0   benzonatate (TESSALON) 100 MG capsule Take 100 mg by mouth 3 (three) times daily as needed for cough.      Biotin 10 MG CAPS Take 10 mg by mouth daily.     budesonide-formoterol (SYMBICORT) 160-4.5 MCG/ACT inhaler Inhale 2 puffs into the lungs 2 (two) times daily. 1 Inhaler 4   calcium carbonate (OSCAL) 1500 (600 Ca) MG TABS tablet Take 600 mg of elemental calcium by mouth daily.     Cholecalciferol (VITAMIN D) 50 MCG (2000 UT) tablet Take 2,000 Units by mouth daily.     furosemide (LASIX) 40 MG tablet Take 40 mg by mouth daily.     gabapentin (NEURONTIN) 300 MG capsule Take 900 mg by mouth at bedtime.     iron polysaccharides (NIFEREX) 150 MG capsule Take 150 mg by mouth daily.     LORazepam (ATIVAN) 1 MG tablet Take 1 tablet (1 mg total) by mouth every 6 (six) hours as needed for anxiety. 30 tablet 0   losartan (COZAAR) 50 MG tablet Take 50 mg by mouth daily.     Multiple Vitamin (MULTIVITAMIN WITH MINERALS)  TABS tablet Take 1 tablet by mouth daily.     raloxifene (EVISTA) 60 MG tablet Take 60 mg by mouth every morning.     Tiotropium Bromide Monohydrate (SPIRIVA RESPIMAT) 2.5 MCG/ACT AERS Inhale 1 puff into the lungs daily.     traZODone (DESYREL) 100 MG tablet Take 1 tablet (100 mg total) by mouth at bedtime as needed for sleep. (Patient taking differently: Take 100 mg by mouth at bedtime.) 10 tablet 0   No current facility-administered medications for this encounter.    Physical Findings: The patient is in no acute distress. Patient is alert and oriented.  vitals were not taken for this visit. .  No significant changes. Lungs are clear to auscultation bilaterally. Heart has regular rate and  rhythm. No palpable cervical, supraclavicular, or axillary adenopathy. Abdomen soft, non-tender, normal bowel sounds.   Lab Findings: Lab Results  Component Value Date   WBC 7.8 05/25/2023   HGB 10.4 (L) 05/25/2023   HCT 35.9 (L) 05/25/2023   MCV 93.7 05/25/2023   PLT 340 05/25/2023    Radiographic Findings: No results found.  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  ***  Plan:  ***   *** minutes of total time was spent for this patient encounter, including preparation, face-to-face counseling with the patient and coordination of care, physical exam, and documentation of the encounter. ____________________________________  Billie Lade, PhD, MD  This document serves as a record of services personally performed by Antony Blackbird, MD. It was created on his behalf by Neena Rhymes, a trained medical scribe. The creation of this record is based on the scribe's personal observations and the provider's statements to them. This document has been checked and approved by the attending provider.

## 2023-07-27 ENCOUNTER — Encounter: Payer: Self-pay | Admitting: Radiation Oncology

## 2023-07-27 ENCOUNTER — Ambulatory Visit
Admission: RE | Admit: 2023-07-27 | Discharge: 2023-07-27 | Disposition: A | Discharge status: Home / Self Care | Payer: 59 | Source: Ambulatory Visit | Attending: Radiation Oncology | Admitting: Radiation Oncology

## 2023-07-27 VITALS — BP 120/79 | HR 105 | Temp 98.5°F | Resp 17 | Wt 158.8 lb

## 2023-07-27 DIAGNOSIS — C3432 Malignant neoplasm of lower lobe, left bronchus or lung: Secondary | ICD-10-CM | POA: Insufficient documentation

## 2023-07-27 DIAGNOSIS — Z923 Personal history of irradiation: Secondary | ICD-10-CM | POA: Insufficient documentation

## 2023-07-27 DIAGNOSIS — R911 Solitary pulmonary nodule: Secondary | ICD-10-CM

## 2023-07-27 HISTORY — DX: Personal history of irradiation: Z92.3

## 2023-07-27 MED ORDER — METHYLPREDNISOLONE 4 MG PO TBPK
ORAL_TABLET | ORAL | 0 refills | Status: DC
Start: 2023-07-27 — End: 2023-11-02

## 2023-07-27 MED ORDER — AZITHROMYCIN 250 MG PO TABS
ORAL_TABLET | ORAL | 0 refills | Status: DC
Start: 2023-07-27 — End: 2023-11-02

## 2023-07-27 NOTE — Progress Notes (Signed)
Katie Waller is here today for follow up post radiation to the lung.  Lung Side: Left, patient completed treatment on 06/26/23  Does the patient complain of any of the following: Pain:No Shortness of breath w/wo exertion: Yes, she is on five liters of oxygen continously. Cough: Yes Hemoptysis: No Pain with swallowing: No Swallowing/choking concerns: No Appetite: Good Energy Level: Poor Post radiation skin Changes: No BP 120/79 (BP Location: Right Arm, Patient Position: Sitting, Cuff Size: Normal)   Pulse (!) 105   Temp 98.5 F (36.9 C) (Oral)   Resp 17   Wt 158 lb 12.8 oz (72 kg)   SpO2 100% Comment: @5L   BMI 30.00 kg/m      Additional comments if applicable:

## 2023-08-10 ENCOUNTER — Telehealth: Payer: Self-pay | Admitting: Radiology

## 2023-08-10 NOTE — Telephone Encounter (Signed)
I called the patient to follow-up after our last appointment. She states her cough has improved and she is feeling much better. She was very happy with her progress to date. We will continue to plan on seeing her in 3 months after her next CT scan.     Joyice Faster, PA-C

## 2023-08-18 DIAGNOSIS — J449 Chronic obstructive pulmonary disease, unspecified: Secondary | ICD-10-CM | POA: Diagnosis not present

## 2023-09-18 DIAGNOSIS — J449 Chronic obstructive pulmonary disease, unspecified: Secondary | ICD-10-CM | POA: Diagnosis not present

## 2023-10-16 ENCOUNTER — Telehealth: Payer: Self-pay | Admitting: *Deleted

## 2023-10-16 NOTE — Telephone Encounter (Signed)
 CALLED PATIENT TO INFORM OF CT FOR 10-27-23- ARRIVAL TIME- 2:45 PM @ [facility], NO RESTRICTIONS TO SCAN, PATIENT TO RECEIVE RESULTS FROM DR. KINARD ON 11-02-23 @ 9:30 AM, SPOKE WITH PATIENT AND SHE IS AWARE OF THESE APPTS. AND THE INSTRUICTIONS

## 2023-10-19 DIAGNOSIS — J449 Chronic obstructive pulmonary disease, unspecified: Secondary | ICD-10-CM | POA: Diagnosis not present

## 2023-10-27 ENCOUNTER — Ambulatory Visit (HOSPITAL_COMMUNITY)
Admission: RE | Admit: 2023-10-27 | Discharge: 2023-10-27 | Disposition: A | Discharge status: Home / Self Care | Payer: 59 | Source: Ambulatory Visit | Attending: Radiology | Admitting: Radiology

## 2023-10-27 DIAGNOSIS — R911 Solitary pulmonary nodule: Secondary | ICD-10-CM | POA: Diagnosis not present

## 2023-10-27 DIAGNOSIS — C3490 Malignant neoplasm of unspecified part of unspecified bronchus or lung: Secondary | ICD-10-CM | POA: Diagnosis not present

## 2023-10-27 DIAGNOSIS — I700 Atherosclerosis of aorta: Secondary | ICD-10-CM | POA: Diagnosis not present

## 2023-10-27 DIAGNOSIS — J439 Emphysema, unspecified: Secondary | ICD-10-CM | POA: Diagnosis not present

## 2023-11-01 NOTE — Progress Notes (Signed)
 Radiation Oncology         [phone] ________________________________  Name: Katie Waller MRN: [id number]  Date: 11/02/2023  DOB: 17-Oct-1945  Follow-Up Visit Note  CC: Katie James, MD  Katie Igo, DO  No diagnosis found.  Diagnosis:   The primary encounter diagnosis was Malignant neoplasm of lower lobe of left lung (HCC). A diagnosis of Lung nodule was also pertinent to this visit.   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 She was last seen in office on 07-27-23. Most recent CT chest done on 10-27-23 showed ***  No other significant oncologic interval history since the patient was last seen.                                 Allergies:  is allergic to ace inhibitors, diphenhydramine, doxycycline hyclate, and codeine.  Meds: Current Outpatient Medications  Medication Sig Dispense Refill   acetaminophen (TYLENOL) 500 MG tablet Take 1,000 mg by mouth every 6 (six) hours as needed for moderate pain.     albuterol (PROVENTIL HFA;VENTOLIN HFA) 108 (90 BASE) MCG/ACT inhaler Inhale 2 puffs into the lungs every 6 (six) hours as needed for wheezing or shortness of breath. 1 Inhaler 0   amLODipine (NORVASC) 5 MG tablet Take 5 mg by mouth every morning.     aspirin 81 MG chewable tablet Chew 1 tablet (81 mg total) by mouth daily.     atorvastatin (LIPITOR) 10 MG tablet TAKE 1 TABLET BY MOUTH EVERY DAY 90 tablet 1   azithromycin (ZITHROMAX Z-PAK) 250 MG tablet Take 2 tablets today, then one tablet daily for the next 4  days. 6 each 0   azithromycin (ZITHROMAX) 250 MG tablet Take 2 tablets today, then one tablet daily for the next 4 days. 6 tablet 0   benzonatate (TESSALON) 100 MG capsule Take 100 mg by mouth 3 (three) times daily as needed for cough.      Biotin 10 MG CAPS Take 10 mg by mouth daily.     budesonide-formoterol (SYMBICORT) 160-4.5 MCG/ACT inhaler Inhale 2 puffs into the lungs 2 (two) times daily. 1 Inhaler 4   calcium carbonate (OSCAL) 1500 (600 Ca) MG TABS tablet Take 600 mg of elemental calcium by mouth daily.     Cholecalciferol (VITAMIN D) 50 MCG (2000 UT) tablet Take 2,000 Units by mouth daily.     furosemide (LASIX) 40 MG tablet Take 40 mg by mouth daily.     gabapentin (NEURONTIN) 300 MG capsule Take 900 mg by mouth at bedtime.     iron polysaccharides (NIFEREX) 150 MG capsule Take 150 mg by mouth daily.     LORazepam (ATIVAN) 1 MG tablet Take 1 tablet (1 mg total) by mouth every 6 (six) hours as needed for anxiety. 30 tablet 0   losartan (COZAAR) 50 MG tablet Take 50 mg by mouth daily.     methylPREDNISolone (MEDROL DOSEPAK) 4 MG TBPK tablet Day 1:  (  2) tablets before breakfast, (1) after lunch, (1) after dinner, (2) at bedtime  Day 2:  (1) before breakfast, (1) after lunch, (1) after dinner, (2) at bedtime  Day 3:  (1) before breakfast, (1) after lunch, (1) after dinner, (1) at bedtime  Day 4:  (1) before breakfast, (1) after lunch, (1) at bedtime  Day 5:  (1) before breakfast, (1) at bedtime  Day 6:  (1) before breakfast 21 tablet 0   Multiple Vitamin (MULTIVITAMIN WITH MINERALS) TABS tablet Take 1 tablet by mouth daily.     raloxifene (EVISTA) 60 MG tablet Take 60 mg by mouth every morning.     Tiotropium Bromide Monohydrate (SPIRIVA RESPIMAT) 2.5 MCG/ACT AERS Inhale 1 puff into the lungs daily.     traZODone (DESYREL) 100 MG tablet Take 1 tablet (100 mg total) by mouth at bedtime as needed for sleep. (Patient taking differently: Take 100 mg by mouth at bedtime.) 10 tablet 0   No  current facility-administered medications for this encounter.    Physical Findings: The patient is in no acute distress. Patient is alert and oriented.  vitals were not taken for this visit. .  No significant changes. Lungs are clear to auscultation bilaterally. Heart has regular rate and rhythm. No palpable cervical, supraclavicular, or axillary adenopathy. Abdomen soft, non-tender, normal bowel sounds.   Lab Findings: Lab Results  Component Value Date   WBC 7.8 05/25/2023   HGB 10.4 (L) 05/25/2023   HCT 35.9 (L) 05/25/2023   MCV 93.7 05/25/2023   PLT 340 05/25/2023    Radiographic Findings: No results found.  Impression:  The primary encounter diagnosis was Malignant neoplasm of lower lobe of left lung (HCC). A diagnosis of Lung nodule was also pertinent to this visit.   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  ***  Plan:  ***   *** minutes of total time was spent for this patient encounter, including preparation, face-to-face counseling with the patient and coordination of care, physical exam, and documentation of the encounter. ____________________________________  Billie Lade, PhD, MD  This document serves as a record of services personally performed by Antony Blackbird, MD. It was created on his behalf by Herbie Saxon, a trained medical scribe. The creation of this record is based on the scribe's personal observations and the provider's statements to them. This document has been checked and approved by the attending provider.

## 2023-11-02 ENCOUNTER — Ambulatory Visit
Admission: RE | Admit: 2023-11-02 | Discharge: 2023-11-02 | Disposition: A | Discharge status: Home / Self Care | Payer: 59 | Source: Ambulatory Visit | Attending: Radiation Oncology | Admitting: Radiation Oncology

## 2023-11-02 ENCOUNTER — Encounter: Payer: Self-pay | Admitting: Radiation Oncology

## 2023-11-02 VITALS — BP 134/71 | HR 108 | Temp 98.0°F | Resp 20 | Ht 61.0 in | Wt 158.1 lb

## 2023-11-02 DIAGNOSIS — K7689 Other specified diseases of liver: Secondary | ICD-10-CM | POA: Insufficient documentation

## 2023-11-02 DIAGNOSIS — I3139 Other pericardial effusion (noninflammatory): Secondary | ICD-10-CM | POA: Insufficient documentation

## 2023-11-02 DIAGNOSIS — Z79899 Other long term (current) drug therapy: Secondary | ICD-10-CM | POA: Diagnosis not present

## 2023-11-02 DIAGNOSIS — C3432 Malignant neoplasm of lower lobe, left bronchus or lung: Secondary | ICD-10-CM | POA: Diagnosis not present

## 2023-11-02 DIAGNOSIS — J439 Emphysema, unspecified: Secondary | ICD-10-CM | POA: Diagnosis not present

## 2023-11-02 DIAGNOSIS — R911 Solitary pulmonary nodule: Secondary | ICD-10-CM

## 2023-11-02 DIAGNOSIS — R058 Other specified cough: Secondary | ICD-10-CM | POA: Insufficient documentation

## 2023-11-02 DIAGNOSIS — Z7951 Long term (current) use of inhaled steroids: Secondary | ICD-10-CM | POA: Insufficient documentation

## 2023-11-02 DIAGNOSIS — Z7952 Long term (current) use of systemic steroids: Secondary | ICD-10-CM | POA: Insufficient documentation

## 2023-11-02 DIAGNOSIS — Z7982 Long term (current) use of aspirin: Secondary | ICD-10-CM | POA: Diagnosis not present

## 2023-11-02 DIAGNOSIS — I2510 Atherosclerotic heart disease of native coronary artery without angina pectoris: Secondary | ICD-10-CM | POA: Diagnosis not present

## 2023-11-02 DIAGNOSIS — C342 Malignant neoplasm of middle lobe, bronchus or lung: Secondary | ICD-10-CM | POA: Diagnosis not present

## 2023-11-02 DIAGNOSIS — I700 Atherosclerosis of aorta: Secondary | ICD-10-CM | POA: Insufficient documentation

## 2023-11-02 DIAGNOSIS — Z923 Personal history of irradiation: Secondary | ICD-10-CM | POA: Insufficient documentation

## 2023-11-02 DIAGNOSIS — Z87891 Personal history of nicotine dependence: Secondary | ICD-10-CM | POA: Diagnosis not present

## 2023-11-02 NOTE — Progress Notes (Signed)
 Katie Waller is here today for follow up post radiation to the lung and to receive CT results.  Lung Side: Left, completed radiation on 06/26/2023.  Does the patient complain of any of the following: Pain:Denies Shortness of breath w/wo exertion: Yes Cough: Yes Hemoptysis: Denies Pain with swallowing: Denies Swallowing/choking concerns: Denies Appetite: Good Energy Level: Poor Post radiation skin Changes: Denies     BP 134/71 (BP Location: Left Arm, Patient Position: Sitting)   Pulse (!) 108   Temp 98 F (36.7 C) (Temporal)   Resp 20   Ht 5\' 1"  (1.549 m)   Wt 158 lb 2 oz (71.7 kg)   SpO2 95%   BMI 29.88 kg/m

## 2023-11-10 DIAGNOSIS — E7800 Pure hypercholesterolemia, unspecified: Secondary | ICD-10-CM | POA: Diagnosis not present

## 2023-11-10 DIAGNOSIS — J449 Chronic obstructive pulmonary disease, unspecified: Secondary | ICD-10-CM | POA: Diagnosis not present

## 2023-11-10 DIAGNOSIS — N1831 Chronic kidney disease, stage 3a: Secondary | ICD-10-CM | POA: Diagnosis not present

## 2023-11-10 DIAGNOSIS — J439 Emphysema, unspecified: Secondary | ICD-10-CM | POA: Diagnosis not present

## 2023-11-10 DIAGNOSIS — I5032 Chronic diastolic (congestive) heart failure: Secondary | ICD-10-CM | POA: Diagnosis not present

## 2023-11-10 DIAGNOSIS — J9611 Chronic respiratory failure with hypoxia: Secondary | ICD-10-CM | POA: Diagnosis not present

## 2023-11-10 DIAGNOSIS — R202 Paresthesia of skin: Secondary | ICD-10-CM | POA: Diagnosis not present

## 2023-11-10 DIAGNOSIS — R7303 Prediabetes: Secondary | ICD-10-CM | POA: Diagnosis not present

## 2023-11-10 DIAGNOSIS — C3432 Malignant neoplasm of lower lobe, left bronchus or lung: Secondary | ICD-10-CM | POA: Diagnosis not present

## 2023-11-10 DIAGNOSIS — I110 Hypertensive heart disease with heart failure: Secondary | ICD-10-CM | POA: Diagnosis not present

## 2023-11-16 DIAGNOSIS — J449 Chronic obstructive pulmonary disease, unspecified: Secondary | ICD-10-CM | POA: Diagnosis not present

## 2023-12-17 DIAGNOSIS — J449 Chronic obstructive pulmonary disease, unspecified: Secondary | ICD-10-CM | POA: Diagnosis not present

## 2024-01-02 ENCOUNTER — Other Ambulatory Visit: Payer: Self-pay | Admitting: Family Medicine

## 2024-01-02 DIAGNOSIS — R0989 Other specified symptoms and signs involving the circulatory and respiratory systems: Secondary | ICD-10-CM

## 2024-01-10 ENCOUNTER — Ambulatory Visit
Admission: RE | Admit: 2024-01-10 | Discharge: 2024-01-10 | Disposition: A | Discharge status: Home / Self Care | Source: Ambulatory Visit | Attending: Family Medicine | Admitting: Family Medicine

## 2024-01-10 DIAGNOSIS — R0989 Other specified symptoms and signs involving the circulatory and respiratory systems: Secondary | ICD-10-CM

## 2024-01-16 DIAGNOSIS — J449 Chronic obstructive pulmonary disease, unspecified: Secondary | ICD-10-CM | POA: Diagnosis not present

## 2024-02-15 ENCOUNTER — Ambulatory Visit: Attending: Cardiology | Admitting: Cardiology

## 2024-02-15 VITALS — BP 96/48 | HR 92 | Ht 61.0 in | Wt 158.0 lb

## 2024-02-15 DIAGNOSIS — I110 Hypertensive heart disease with heart failure: Secondary | ICD-10-CM

## 2024-02-15 DIAGNOSIS — I6523 Occlusion and stenosis of bilateral carotid arteries: Secondary | ICD-10-CM | POA: Diagnosis not present

## 2024-02-15 DIAGNOSIS — J449 Chronic obstructive pulmonary disease, unspecified: Secondary | ICD-10-CM | POA: Diagnosis not present

## 2024-02-15 DIAGNOSIS — J9611 Chronic respiratory failure with hypoxia: Secondary | ICD-10-CM | POA: Insufficient documentation

## 2024-02-15 DIAGNOSIS — I5032 Chronic diastolic (congestive) heart failure: Secondary | ICD-10-CM | POA: Diagnosis not present

## 2024-02-15 DIAGNOSIS — C3432 Malignant neoplasm of lower lobe, left bronchus or lung: Secondary | ICD-10-CM | POA: Insufficient documentation

## 2024-02-15 NOTE — Progress Notes (Signed)
 Cardiology Office Note:    Date:  02/15/2024   ID:  Katie Waller, DOB 11/06/1945, MRN [id number]  PCP:  Sun, Vyvyan, MD  Cardiologist:  Ralene Burger, MD    Referring MD: Sun, Vyvyan, MD   Chief Complaint  Patient presents with   Follow-up    History of Present Illness:    Katie Waller is a 78 y.o. 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  Overall doing fine denies have any chest pain tightness squeezing pressure burning chest she lives very sedentary lifestyle.  Oxygen  all the time.  That is because of COPD.  Past Medical History:  Diagnosis Date   Abnormal finding on EKG 09/19/2013   Acute on chronic respiratory failure with hypercapnia (HCC)    Acute respiratory failure (HCC) 06/08/2016   Anxiety 11/09/2020   Arthritis    Asthma    Carotid bruit 11/09/2020   Chronic diastolic CHF (congestive heart failure) (HCC) 07/28/2017   Chronic obstructive pulmonary disease, unspecified (HCC) 11/09/2020   COPD, group D, by GOLD 2017 classification (HCC) 09/17/2013   Cough 11/09/2020   Decreased estrogen level 11/09/2020   Diabetes (HCC)    Dyspnea    Edema 11/09/2020   Essential hypertension 09/18/2013   Goals of care, counseling/discussion    Hardening of the aorta (main artery of the heart) (HCC) 11/09/2020   Heart disease    History of radiation therapy    Left Lung- 06/20/23-06/26/23- Dr. 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   Transient ischemic attack 11/09/2020   Venous stasis of both lower extremities 06/13/2016    Past Surgical History:  Procedure Laterality Date   BRONCHIAL BIOPSY  05/25/2023   Procedure: BRONCHIAL BIOPSIES;  Surgeon: Prudy Brownie, DO;  Location: [facility];  Service: Pulmonary;;   BRONCHIAL NEEDLE ASPIRATION BIOPSY  05/25/2023   Procedure: BRONCHIAL NEEDLE ASPIRATION BIOPSIES;  Surgeon: Prudy Brownie, DO;  Location: [facility];  Service: Pulmonary;;   FIDUCIAL MARKER PLACEMENT  05/25/2023   Procedure: FIDUCIAL MARKER PLACEMENT;  Surgeon: Prudy Brownie, DO;  Location: [facility];  Service: Pulmonary;;   KNEE SURGERY Left     Current Medications: Current Meds  Medication Sig   acetaminophen  (TYLENOL ) 500 MG tablet Take 1,000 mg by mouth every 6 (six) hours as needed for moderate pain.   albuterol  (PROVENTIL  HFA;VENTOLIN  HFA) 108 (90 BASE) MCG/ACT inhaler Inhale 2 puffs into the lungs every 6 (six) hours as needed for wheezing or shortness of breath.   amLODipine  (NORVASC ) 5 MG tablet Take 5 mg by mouth every morning.   aspirin  81 MG chewable tablet Chew 1 tablet (81 mg total) by mouth daily.   atorvastatin  (LIPITOR) 10 MG tablet TAKE 1 TABLET BY MOUTH EVERY DAY (Patient taking differently: Take 10 mg by mouth daily.)   benzonatate  (TESSALON ) 100 MG capsule Take 100 mg by mouth 3 (three) times daily as needed for cough.  Biotin 10 MG CAPS Take 10 mg by mouth daily.   budesonide -formoterol  (SYMBICORT ) 160-4.5 MCG/ACT inhaler Inhale 2 puffs into the lungs 2 (two) times daily.   calcium  carbonate (OSCAL) 1500 (600 Ca) MG TABS tablet Take 600 mg of elemental calcium  by mouth daily.   Cholecalciferol (VITAMIN D ) 50 MCG (2000 UT) tablet Take 2,000 Units by mouth daily.   furosemide  (LASIX ) 40 MG tablet Take 40 mg by mouth daily.   gabapentin (NEURONTIN) 300 MG capsule Take 900 mg by mouth  at bedtime.   iron polysaccharides (NIFEREX) 150 MG capsule Take 150 mg by mouth daily.   LORazepam  (ATIVAN ) 1 MG tablet Take 1 tablet (1 mg total) by mouth every 6 (six) hours as needed for anxiety.   losartan  (COZAAR ) 50 MG tablet Take 50 mg by mouth daily.   Multiple Vitamin (MULTIVITAMIN WITH MINERALS) TABS tablet Take 1 tablet by mouth daily.   raloxifene  (EVISTA ) 60 MG tablet Take 60 mg by mouth every morning.   Tiotropium Bromide  Monohydrate (SPIRIVA  RESPIMAT) 2.5 MCG/ACT AERS Inhale 1 puff into the lungs daily.   traZODone  (DESYREL ) 100 MG tablet Take 1 tablet (100 mg total) by mouth at bedtime as needed for sleep. (Patient taking differently: Take 100 mg by mouth at bedtime.)     Allergies:   Ace inhibitors, Diphenhydramine, Doxycycline hyclate, and Codeine   Social History   Socioeconomic History   Marital status: Divorced    Spouse name: Not on file   Number of children: Not on file   Years of education: Not on file   Highest education level: Not on file  Occupational History   Not on file  Tobacco Use   Smoking status: Former    Current packs/day: 0.00    Average packs/day: 1 pack/day for 55.0 years (55.0 ttl pk-yrs)    Types: Cigarettes    Start date: 07/22/2017    Quit date: 07/23/2017    Years since quitting: 6.5   Smokeless tobacco: Never  Vaping Use   Vaping status: Former  Substance and Sexual Activity   Alcohol use: No   Drug use: No   Sexual activity: Not Currently  Other Topics Concern   Not on file  Social History Narrative   ** Merged History Encounter **       Social Drivers of Health   Financial Resource Strain: Not on file  Food Insecurity: No Food Insecurity (06/05/2023)   Hunger Vital Sign    Worried About Running Out of Food in the Last Year: Never true    Ran Out of Food in the Last Year: Never true  Transportation Needs: No Transportation Needs (06/05/2023)   PRAPARE - Administrator, Civil Service (Medical): No    Lack of  Transportation (Non-Medical): No  Physical Activity: Not on file  Stress: Not on file  Social Connections: Not on file     Family History: The patient's family history includes Hyperlipidemia in her father and mother; Hypertension in her father and mother. ROS:   Please see the history of present illness.    All 14 point review of systems negative except as described per history of present illness  EKGs/Labs/Other Studies Reviewed:         Recent Labs: 05/25/2023: ALT 13; BUN 16; Creatinine, Ser 0.96; Hemoglobin 10.4; Platelets 340; Potassium 4.3; Sodium 139  Recent Lipid Panel    Component Value Date/Time   CHOL 169 09/18/2013 0614   TRIG 134 09/18/2013 0614   HDL  76 09/18/2013 0614   CHOLHDL 2.2 09/18/2013 0614   VLDL 27 09/18/2013 0614   LDLCALC 66 09/18/2013 0614    Physical Exam:    VS:  BP 90/64 (BP Location: Right Arm, Patient Position: Sitting)   Pulse 92   Ht 5' 1 (1.549 m)   Wt 158 lb (71.7 kg)   SpO2 94%   BMI 29.85 kg/m     Wt Readings from Last 3 Encounters:  02/15/24 158 lb (71.7 kg)  11/02/23 158 lb 2 oz (71.7 kg)  07/27/23 158 lb 12.8 oz (72 kg)     GEN:  Well nourished, well developed in no acute distress HEENT: Normal NECK: No JVD; No carotid bruits LYMPHATICS: No lymphadenopathy CARDIAC: RRR, no murmurs, no rubs, no gallops RESPIRATORY:  Clear to auscultation without rales, wheezing or rhonchi  ABDOMEN: Soft, non-tender, non-distended MUSCULOSKELETAL:  No edema; No deformity  SKIN: Warm and dry LOWER EXTREMITIES: no swelling NEUROLOGIC:  Alert and oriented x 3 PSYCHIATRIC:  Normal affect   ASSESSMENT:    1. Chronic diastolic CHF (congestive heart failure) (HCC)   2. Bilateral carotid artery stenosis   3. Hypertensive heart failure (HCC)   4. COPD, group D, by GOLD 2017 classification (HCC)    PLAN:    In order of problems listed above:  Chronic diastolic congestive heart failure compensated on physical exam. Essential  hypertension blood pressure actually low today we will recheck it anticipate need to discontinue amlodipine . Bilateral carotic artery stenosis.  Not critical follow-up by vascular surgeon from [place]. COPD.  Noted.  Significant. Dyslipidemia I did review K PN LDL 72 HDL 85 this is from 11/10/2023 continue present management   Medication Adjustments/Labs and Tests Ordered: Current medicines are reviewed at length with the patient today.  Concerns regarding medicines are outlined above.  No orders of the defined types were placed in this encounter.  Medication changes: No orders of the defined types were placed in this encounter.   Signed, Manfred Seed, MD, [facility] 02/15/2024 2:43 PM    [facility]

## 2024-02-15 NOTE — Addendum Note (Signed)
 Addended by: Shawnee Dellen D on: 02/15/2024 02:59 PM   Modules accepted: Orders

## 2024-02-15 NOTE — Patient Instructions (Addendum)
 Medication Instructions:   STOP: Amlodipine   Lab Work: None Ordered If you have labs (blood work) drawn today and your tests are completely normal, you will receive your results only by: MyChart Message (if you have MyChart) OR A paper copy in the mail If you have any lab test that is abnormal or we need to change your treatment, we will call you to review the results.   Testing/Procedures: None Ordered   Follow-Up: At [facility], you and your health needs are our priority.  As part of our continuing mission to provide you with exceptional heart care, we have created designated Provider Care Teams.  These Care Teams include your primary Cardiologist (physician) and Advanced Practice Providers (APPs -  Physician Assistants and Nurse Practitioners) who all work together to provide you with the care you need, when you need it.  We recommend signing up for the patient portal called "MyChart".  Sign up information is provided on this After Visit Summary.  MyChart is used to connect with patients for Virtual Visits (Telemedicine).  Patients are able to view lab/test results, encounter notes, upcoming appointments, etc.  Non-urgent messages can be sent to your provider as well.   To learn more about what you can do with MyChart, go to [email or web address].    Your next appointment:   6 month(s)  The format for your next appointment:   In Person  Provider:   Gypsy Balsam, MD    Other Instructions NA

## 2024-02-16 DIAGNOSIS — J449 Chronic obstructive pulmonary disease, unspecified: Secondary | ICD-10-CM | POA: Diagnosis not present

## 2024-03-17 DIAGNOSIS — J449 Chronic obstructive pulmonary disease, unspecified: Secondary | ICD-10-CM | POA: Diagnosis not present

## 2024-03-22 DIAGNOSIS — J439 Emphysema, unspecified: Secondary | ICD-10-CM | POA: Diagnosis not present

## 2024-03-22 DIAGNOSIS — R058 Other specified cough: Secondary | ICD-10-CM | POA: Diagnosis not present

## 2024-03-22 DIAGNOSIS — I700 Atherosclerosis of aorta: Secondary | ICD-10-CM | POA: Diagnosis not present

## 2024-03-22 DIAGNOSIS — R0789 Other chest pain: Secondary | ICD-10-CM | POA: Diagnosis not present

## 2024-03-26 ENCOUNTER — Other Ambulatory Visit: Payer: Self-pay

## 2024-03-26 ENCOUNTER — Encounter: Payer: Self-pay | Admitting: Radiation Oncology

## 2024-03-26 ENCOUNTER — Emergency Department (HOSPITAL_BASED_OUTPATIENT_CLINIC_OR_DEPARTMENT_OTHER)

## 2024-03-26 ENCOUNTER — Encounter (HOSPITAL_BASED_OUTPATIENT_CLINIC_OR_DEPARTMENT_OTHER): Payer: Self-pay

## 2024-03-26 ENCOUNTER — Emergency Department (HOSPITAL_BASED_OUTPATIENT_CLINIC_OR_DEPARTMENT_OTHER)
Admission: EM | Admit: 2024-03-26 | Discharge: 2024-03-26 | Disposition: A | Discharge status: Home / Self Care | Attending: Emergency Medicine | Admitting: Emergency Medicine

## 2024-03-26 DIAGNOSIS — Z7982 Long term (current) use of aspirin: Secondary | ICD-10-CM | POA: Insufficient documentation

## 2024-03-26 DIAGNOSIS — J439 Emphysema, unspecified: Secondary | ICD-10-CM | POA: Diagnosis not present

## 2024-03-26 DIAGNOSIS — R0789 Other chest pain: Secondary | ICD-10-CM | POA: Insufficient documentation

## 2024-03-26 DIAGNOSIS — R000 Tachycardia, unspecified: Secondary | ICD-10-CM | POA: Insufficient documentation

## 2024-03-26 DIAGNOSIS — R0781 Pleurodynia: Secondary | ICD-10-CM | POA: Diagnosis not present

## 2024-03-26 DIAGNOSIS — I2510 Atherosclerotic heart disease of native coronary artery without angina pectoris: Secondary | ICD-10-CM | POA: Diagnosis not present

## 2024-03-26 DIAGNOSIS — Z9221 Personal history of antineoplastic chemotherapy: Secondary | ICD-10-CM | POA: Insufficient documentation

## 2024-03-26 DIAGNOSIS — R079 Chest pain, unspecified: Secondary | ICD-10-CM

## 2024-03-26 DIAGNOSIS — K769 Liver disease, unspecified: Secondary | ICD-10-CM | POA: Diagnosis not present

## 2024-03-26 DIAGNOSIS — C3432 Malignant neoplasm of lower lobe, left bronchus or lung: Secondary | ICD-10-CM | POA: Diagnosis not present

## 2024-03-26 DIAGNOSIS — Z85118 Personal history of other malignant neoplasm of bronchus and lung: Secondary | ICD-10-CM | POA: Insufficient documentation

## 2024-03-26 LAB — CBC WITH DIFFERENTIAL/PLATELET
Abs Immature Granulocytes: 0.01 K/uL (ref 0.00–0.07)
Basophils Absolute: 0 K/uL (ref 0.0–0.1)
Basophils Relative: 0 %
Eosinophils Absolute: 0.2 K/uL (ref 0.0–0.5)
Eosinophils Relative: 3 %
HCT: 32.7 % — ABNORMAL LOW (ref 36.0–46.0)
Hemoglobin: 9.7 g/dL — ABNORMAL LOW (ref 12.0–15.0)
Immature Granulocytes: 0 %
Lymphocytes Relative: 29 %
Lymphs Abs: 2 K/uL (ref 0.7–4.0)
MCH: 28.5 pg (ref 26.0–34.0)
MCHC: 29.7 g/dL — ABNORMAL LOW (ref 30.0–36.0)
MCV: 96.2 fL (ref 80.0–100.0)
Monocytes Absolute: 0.7 K/uL (ref 0.1–1.0)
Monocytes Relative: 10 %
Neutro Abs: 4 K/uL (ref 1.7–7.7)
Neutrophils Relative %: 58 %
Platelets: 275 K/uL (ref 150–400)
RBC: 3.4 MIL/uL — ABNORMAL LOW (ref 3.87–5.11)
RDW: 13.2 % (ref 11.5–15.5)
WBC: 7 K/uL (ref 4.0–10.5)
nRBC: 0 % (ref 0.0–0.2)

## 2024-03-26 LAB — COMPREHENSIVE METABOLIC PANEL WITH GFR
ALT: 11 U/L (ref 0–44)
AST: 16 U/L (ref 15–41)
Albumin: 4.1 g/dL (ref 3.5–5.0)
Alkaline Phosphatase: 46 U/L (ref 38–126)
Anion gap: 7 (ref 5–15)
BUN: 11 mg/dL (ref 8–23)
CO2: 37 mmol/L — ABNORMAL HIGH (ref 22–32)
Calcium: 10 mg/dL (ref 8.9–10.3)
Chloride: 98 mmol/L (ref 98–111)
Creatinine, Ser: 0.86 mg/dL (ref 0.44–1.00)
GFR, Estimated: >60 mL/min (ref >60)
Glucose, Bld: 101 mg/dL — ABNORMAL HIGH (ref 70–99)
Potassium: 4.4 mmol/L (ref 3.5–5.1)
Sodium: 142 mmol/L (ref 135–145)
Total Bilirubin: 0.3 mg/dL (ref 0.0–1.2)
Total Protein: 6.6 g/dL (ref 6.5–8.1)

## 2024-03-26 LAB — PRO BRAIN NATRIURETIC PEPTIDE: Pro Brain Natriuretic Peptide: 142 pg/mL (ref <300.0)

## 2024-03-26 LAB — LIPASE, BLOOD: Lipase: 35 U/L (ref 11–51)

## 2024-03-26 LAB — TROPONIN T, HIGH SENSITIVITY: Troponin T High Sensitivity: <15 ng/L (ref <19)

## 2024-03-26 MED ORDER — DICLOFENAC SODIUM 1 % EX GEL: 4.0000 g | Freq: Four times a day (QID) | CUTANEOUS | 0 refills | Status: DC

## 2024-03-26 MED ORDER — IOHEXOL 350 MG/ML SOLN
100.0000 mL | Freq: Once | INTRAVENOUS | Status: AC | PRN
  Administered 2024-03-26: 100 mL via INTRAVENOUS

## 2024-03-26 MED ORDER — MORPHINE SULFATE 15 MG PO TABS: 7.5000 mg | ORAL_TABLET | ORAL | 0 refills | Status: DC | PRN

## 2024-03-26 NOTE — Discharge Instructions (Addendum)
 Please follow-up with your family doctor and your lung doctor, and oncologist.   Use the gel as prescribed Also take tylenol  1000mg (2 extra strength) four times a day.   Then take the pain medicine if you feel like you need it. Narcotics do not help with the pain, they only make you care about it less.  You can become addicted to this, people may break into your house to steal it.  It will constipate you.  If you drive under the influence of this medicine you can get a DUI.

## 2024-03-26 NOTE — ED Triage Notes (Addendum)
 Worsening pain under left breast in ribs x 3 weeks. Had chest xray done Friday with normal results. Given abx, completed today.   Hx COPD, emphysema, lung cancer. Wears 5L O2 @ baseline

## 2024-03-26 NOTE — ED Provider Notes (Signed)
 [place] [facility] Provider Note   CSN: [id number] Arrival date & time: 03/26/24  1026     Patient presents with: Chest Pain   Katie Waller is a 78 y.o. female.   78 yo F with a chief complaints of left-sided chest discomfort.  This been going on for about 3 weeks now.  She has a history of lung cancer in that area status post resection and chemotherapy.  Is thought to be cancer free.  She has had some progressive difficulty breathing and fatigue as well.  Everything seems to make it worse including palpation twisting deep breathing.     Chest Pain      Prior to Admission medications   Medication Sig Start Date End Date Taking? Authorizing Provider  diclofenac  Sodium (VOLTAREN ) 1 % GEL Apply 4 g topically 4 (four) times daily. 03/26/24  Yes Emil Share, DO  morphine  (MSIR) 15 MG tablet Take 0.5 tablets (7.5 mg total) by mouth every 4 (four) hours as needed for severe pain (pain score 7-10). 03/26/24  Yes Emil Share, DO  acetaminophen  (TYLENOL ) 500 MG tablet Take 1,000 mg by mouth every 6 (six) hours as needed for moderate pain.    [provider]  albuterol  (PROVENTIL  HFA;VENTOLIN  HFA) 108 (90 BASE) MCG/ACT inhaler Inhale 2 puffs into the lungs every 6 (six) hours as needed for wheezing or shortness of breath. 09/19/13   Vicci Afton CROME, MD  aspirin  81 MG chewable tablet Chew 1 tablet (81 mg total) by mouth daily. 09/19/13   Johnson, Clanford L, MD  atorvastatin  (LIPITOR) 10 MG tablet TAKE 1 TABLET BY MOUTH EVERY DAY Patient taking differently: Take 10 mg by mouth daily. 07/21/23   Krasowski, Robert J, MD  benzonatate  (TESSALON ) 100 MG capsule Take 100 mg by mouth 3 (three) times daily as needed for cough.     [provider]  Biotin 10 MG CAPS Take 10 mg by mouth daily.    [provider]  budesonide -formoterol  (SYMBICORT ) 160-4.5 MCG/ACT inhaler Inhale 2 puffs into the lungs 2 (two) times daily. 08/23/19    Mannam, Praveen, MD  calcium  carbonate (OSCAL) 1500 (600 Ca) MG TABS tablet Take 600 mg of elemental calcium  by mouth daily.    [provider]  Cholecalciferol (VITAMIN D ) 50 MCG (2000 UT) tablet Take 2,000 Units by mouth daily.    [provider]  furosemide  (LASIX ) 40 MG tablet Take 40 mg by mouth daily.    [provider]  gabapentin (NEURONTIN) 300 MG capsule Take 900 mg by mouth at bedtime.    [provider]  iron polysaccharides (NIFEREX) 150 MG capsule Take 150 mg by mouth daily. 11/05/20   [provider]  LORazepam  (ATIVAN ) 1 MG tablet Take 1 tablet (1 mg total) by mouth every 6 (six) hours as needed for anxiety. 08/07/17   Drusilla Sabas RAMAN, MD  losartan  (COZAAR ) 50 MG tablet Take 50 mg by mouth daily.    [provider]  Multiple Vitamin (MULTIVITAMIN WITH MINERALS) TABS tablet Take 1 tablet by mouth daily.    [provider]  raloxifene  (EVISTA ) 60 MG tablet Take 60 mg by mouth every morning. 08/07/20   [provider]  Tiotropium Bromide  Monohydrate (SPIRIVA  RESPIMAT) 2.5 MCG/ACT AERS Inhale 1 puff into the lungs daily.    [provider]  traZODone  (DESYREL ) 100 MG tablet Take 1 tablet (100 mg total) by mouth at bedtime as needed for sleep. Patient taking differently:  Take 100 mg by mouth at bedtime. 08/07/17   Drusilla Sabas RAMAN, MD    Allergies: Ace inhibitors, Diphenhydramine, Doxycycline hyclate, and Codeine    Review of Systems  Cardiovascular:  Positive for chest pain.    Updated Vital Signs BP (!) 148/74   Pulse 86   Temp 98 F (36.7 C) (Oral)   Resp 13   Ht 5' 1 (1.549 m)   Wt 66.7 kg   SpO2 100%   BMI 27.78 kg/m   Physical Exam Vitals and nursing note reviewed.  Constitutional:      General: She is not in acute distress.    Appearance: She is well-developed. She is not diaphoretic.  HENT:     Head: Normocephalic and atraumatic.  Eyes:     Pupils: Pupils are equal, round, and reactive  to light.  Cardiovascular:     Rate and Rhythm: Normal rate and regular rhythm.     Heart sounds: No murmur heard.    No friction rub. No gallop.  Pulmonary:     Effort: Pulmonary effort is normal.     Breath sounds: No wheezing or rales.  Chest:     Chest wall: Tenderness present.     Comments: Pain along the left anterior rib angles about 6 through 8 reproduces her discomfort.  No appreciable rash. Abdominal:     General: There is no distension.     Palpations: Abdomen is soft.     Tenderness: There is no abdominal tenderness.  Musculoskeletal:        General: No tenderness.     Cervical back: Normal range of motion and neck supple.  Skin:    General: Skin is warm and dry.  Neurological:     Mental Status: She is alert and oriented to person, place, and time.  Psychiatric:        Behavior: Behavior normal.     (all labs ordered are listed, but only abnormal results are displayed) Labs Reviewed  CBC WITH DIFFERENTIAL/PLATELET - Abnormal; Notable for the following components:      Result Value   RBC 3.40 (*)    Hemoglobin 9.7 (*)    HCT 32.7 (*)    MCHC 29.7 (*)    All other components within normal limits  COMPREHENSIVE METABOLIC PANEL WITH GFR - Abnormal; Notable for the following components:   CO2 37 (*)    Glucose, Bld 101 (*)    All other components within normal limits  LIPASE, BLOOD  PRO BRAIN NATRIURETIC PEPTIDE  TROPONIN T, HIGH SENSITIVITY    EKG: EKG Interpretation Date/Time:  Tuesday March 26 2024 10:52:44 EDT Ventricular Rate:  101 PR Interval:  165 QRS Duration:  75 QT Interval:  320 QTC Calculation: 415 R Axis:   64  Text Interpretation: Sinus tachycardia Probable left atrial enlargement Low voltage, precordial leads No significant change since last tracing Confirmed by Emil Share [phone]) on 03/26/2024 11:20:09 AM  Radiology: CT Angio Chest PE W and/or Wo Contrast Result Date: 03/26/2024 CLINICAL DATA:  Evaluate for pulmonary embolism, high  probability, left lower lobe lung cancer. EXAM: CT ANGIOGRAPHY CHEST WITH CONTRAST TECHNIQUE: Multidetector CT imaging of the chest was performed using the standard protocol during bolus administration of intravenous contrast. Multiplanar CT image reconstructions and MIPs were obtained to evaluate the vascular anatomy. RADIATION DOSE REDUCTION: This exam was performed according to the departmental dose-optimization program which includes automated exposure control, adjustment of the mA and/or kV according to patient size and/or use of  iterative reconstruction technique. CONTRAST:  OMNIPAQUE  IOHEXOL  350 MG/ML SOLN COMPARISON:  Chest radiograph March 26, 2024, chest CT October 27, 2023 FINDINGS: Cardiovascular: Satisfactory opacification of the pulmonary arteries to the segmental level. No evidence of pulmonary embolism. Atherosclerotic calcifications of coronary arteries. Pulmonary artery is dilated measuring 3.4 cm. The heart size is mildly enlarged. Trace pericardial effusion. Mediastinum/Nodes: No enlarged mediastinal, hilar, or axillary lymph nodes. Thyroid  gland, trachea, and esophagus demonstrate no significant findings. Lungs/Pleura: Moderate to severe upper lobe predominant centrilobular emphysematous changes. Left lower lobe solid pulmonary nodule measuring 1.2 x 1 cm (12/69) consistent with known primary bronchogenic carcinoma, previously measured 1.2 x 0.9 cm. Fiducial markers are identified proximal to the lesion. Left upper lobe subsolid nodule measuring 2 x 1.6 cm with a solid component measuring 6 mm (12/29). Ground-glass component is more conspicuous to prior exam. This may represent a second primary versus metastatic disease. Remainder of the pulmonary nodules are stable. Left upper lobe pulmonary nodule measuring 5 mm (12/32, stable. Right upper lobe pulmonary nodule measuring 5 mm (12/41, stable. No new nodules. Moderate to severe bronchial and bronchiolar wall thickening. No pleural  effusion. Upper Abdomen: Hypodense liver lesions, stable to prior. Cholelithiasis. Hiatal hernia.No suspicious new adrenal nodule. Musculoskeletal: No suspicious osseous lesion. Review of the MIP images confirms the above findings. IMPRESSION: No suspicious findings to suggest pulmonary embolism. Stable left lower lobe malignancy with fiducial markers in place. Left upper lobe subsolid nodule with interval increase in ground-glass component may represent a second primary (adenocarcinoma spectrum) versus metastatic disease. Solid component is grossly stable to prior. Remainder of the pulmonary nodules are stable. Follow-up according to oncology protocols. No suspicious lymphadenopathy or evidence of new metastatic disease within the field of view. Electronically Signed   By: Megan  Zare M.D.   On: 03/26/2024 12:38   DG Chest Port 1 View Result Date: 03/26/2024 CLINICAL DATA:  Left rib pain for 3 weeks.  History of lung cancer. EXAM: PORTABLE CHEST 1 VIEW COMPARISON:  March 22, 2024. FINDINGS: The heart size and mediastinal contours are within normal limits. Minimal bibasilar subsegmental atelectasis or scarring. The visualized skeletal structures are unremarkable. IMPRESSION: Minimal bibasilar subsegmental atelectasis or scarring. Electronically Signed   By: Lynwood Landy Raddle M.D.   On: 03/26/2024 11:34     Procedures   Medications Ordered in the ED  iohexol  (OMNIPAQUE ) 350 MG/ML injection 100 mL (100 mLs Intravenous Contrast Given 03/26/24 1154)                                    Medical Decision Making Amount and/or Complexity of Data Reviewed Labs: ordered. Radiology: ordered.  Risk Prescription drug management.   78 yo F with a significant past medical history of lung cancer COPD on 5 L at all time comes in with a chief complaints of chest pain and difficulty breathing.  Going on for about 3 weeks now.  Has a chronic cough but thinks it is mildly worse over the past week or so.  Mildly  tachycardic on initial assessment.  Will obtain a chest x-ray blood work.  Chest x-ray independently interpreted by me without focal infiltrate or pneumothorax.  No acute anemia no significant electrolyte abnormalities.  Troponin negative BNP negative.  LFTs and lipase are unremarkable.  CT angiogram of the chest without PE, no obvious pneumonia.  Radiology read with concern for possible developing cancer.  More in the  left upper than left lower region.  Not sure if this is causing her discomfort.  I discussed results with patient and family.  Plan to follow-up with their oncologist and pulmonologist.  1:20 PM:  I have discussed the diagnosis/risks/treatment options with the patient and family.  Evaluation and diagnostic testing in the emergency department does not suggest an emergent condition requiring admission or immediate intervention beyond what has been performed at this time.  They will follow up with PCP, oncology, pulm. We also discussed returning to the ED immediately if new or worsening sx occur. We discussed the sx which are most concerning (e.g., sudden worsening pain, fever, inability to tolerate by mouth, worsening sob) that necessitate immediate return. Medications administered to the patient during their visit and any new prescriptions provided to the patient are listed below.  Medications given during this visit Medications  iohexol  (OMNIPAQUE ) 350 MG/ML injection 100 mL (100 mLs Intravenous Contrast Given 03/26/24 1154)     The patient appears reasonably screen and/or stabilized for discharge and I doubt any other medical condition or other [facility] requiring further screening, evaluation, or treatment in the ED at this time prior to discharge.       Final diagnoses:  Left-sided chest pain    ED Discharge Orders          Ordered    morphine  (MSIR) 15 MG tablet  Every 4 hours PRN        03/26/24 1300    diclofenac  Sodium (VOLTAREN ) 1 % GEL  4 times daily        03/26/24 1300                [place], DO 03/26/24 1320

## 2024-03-27 NOTE — Telephone Encounter (Signed)
 Called patient to make aware that Dr. Shannon has reviewed recent CT scan and would like patient to come in as scheduled in September 2025 after CT scan in August 2025. Patient voiced understanding.

## 2024-04-02 DIAGNOSIS — C3432 Malignant neoplasm of lower lobe, left bronchus or lung: Secondary | ICD-10-CM | POA: Diagnosis not present

## 2024-04-02 DIAGNOSIS — R079 Chest pain, unspecified: Secondary | ICD-10-CM | POA: Diagnosis not present

## 2024-04-17 DIAGNOSIS — J449 Chronic obstructive pulmonary disease, unspecified: Secondary | ICD-10-CM | POA: Diagnosis not present

## 2024-04-30 ENCOUNTER — Ambulatory Visit (HOSPITAL_COMMUNITY)
Admission: RE | Admit: 2024-04-30 | Discharge: 2024-04-30 | Disposition: A | Discharge status: Home / Self Care | Source: Ambulatory Visit | Attending: Radiology | Admitting: Radiology

## 2024-04-30 DIAGNOSIS — R911 Solitary pulmonary nodule: Secondary | ICD-10-CM | POA: Insufficient documentation

## 2024-04-30 DIAGNOSIS — J432 Centrilobular emphysema: Secondary | ICD-10-CM | POA: Diagnosis not present

## 2024-04-30 DIAGNOSIS — C3490 Malignant neoplasm of unspecified part of unspecified bronchus or lung: Secondary | ICD-10-CM | POA: Diagnosis not present

## 2024-04-30 DIAGNOSIS — I700 Atherosclerosis of aorta: Secondary | ICD-10-CM | POA: Diagnosis not present

## 2024-05-08 NOTE — Progress Notes (Signed)
 Radiation Oncology         [phone] ________________________________  Name: Katie Waller MRN: [id number]  Date: 05/09/2024  DOB: 12-27-1945  Follow-Up Visit Note  CC: Sun, Vyvyan, MD  Brenna Adine LITTIE, DO  No diagnosis found.  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 She was last seen in office on 11/02/23 for a follow up visit. Patient continued to follow up with their specialists to manage their chronic conditions.   In the interval since she was last seen, she presented for a follow up visit with her PCP on 03/22/21 complaining of chest pain and a productive cough. She was prescribed antibiotics at that time as chest x-ray was unremarkable. However, she presented to the ED on 7/22 complaining of persistent chest pain. CT angiogram of the chest without PE and no obvious pneumonia. There was an impression from the radiologist was a possible developing cancer in the left upper region however after I reviewed the CT scan, it was determined that changes in the left upper lung was not a malignancy.   Her most recent CT chest done on 04/30/24 showed no substantial interval change in the spiculated left lower lobe pulmonary nodule. Scan also noted a stable 6 mm ground-glass nodule left upper lobe and a 2.0 x 1.6 cm ground-glass component to this lesion measured previously has decreased in the interval.    No other significant oncologic interval history since the patient was last seen.                          Allergies:  is allergic to ace inhibitors, diphenhydramine, doxycycline hyclate, and codeine.  Meds: Current Outpatient Medications  Medication Sig Dispense Refill   acetaminophen  (TYLENOL ) 500 MG tablet Take 1,000 mg by mouth every 6 (six) hours as needed for moderate pain.     albuterol  (PROVENTIL  HFA;VENTOLIN  HFA) 108 (90 BASE) MCG/ACT inhaler Inhale 2 puffs into the lungs every 6 (six) hours as needed for wheezing or shortness of breath. 1 Inhaler 0   aspirin  81 MG chewable tablet Chew 1 tablet (81 mg total) by mouth daily.     atorvastatin  (LIPITOR) 10 MG tablet TAKE 1 TABLET BY MOUTH EVERY DAY (Patient taking differently: Take 10 mg by mouth daily.) 90 tablet 1   benzonatate  (TESSALON ) 100 MG capsule Take 100 mg by mouth 3 (three) times daily as needed for cough.      Biotin 10 MG CAPS Take 10 mg by mouth daily.     budesonide -formoterol  (SYMBICORT ) 160-4.5 MCG/ACT inhaler Inhale 2 puffs into the lungs 2 (two) times daily. 1 Inhaler 4   calcium  carbonate (OSCAL) 1500 (600 Ca) MG TABS tablet Take 600 mg of elemental calcium  by mouth daily.     Cholecalciferol (VITAMIN D ) 50 MCG (2000 UT) tablet Take 2,000 Units by mouth daily.     diclofenac  Sodium (VOLTAREN ) 1 % GEL Apply 4 g topically 4 (four) times daily. 100 g 0  furosemide  (LASIX ) 40 MG tablet Take 40 mg by mouth daily.     gabapentin (NEURONTIN) 300 MG capsule Take 900 mg by mouth at bedtime.     iron polysaccharides (NIFEREX) 150 MG capsule Take 150 mg by mouth daily.     LORazepam  (ATIVAN ) 1 MG tablet Take 1 tablet (1 mg total) by mouth every 6 (six) hours as needed for anxiety. 30 tablet 0   losartan  (COZAAR ) 50 MG tablet Take 50 mg by mouth daily.     morphine  (MSIR) 15 MG tablet Take 0.5 tablets (7.5 mg total) by mouth every 4 (four) hours as needed for severe pain (pain score 7-10). 7 tablet 0   Multiple Vitamin (MULTIVITAMIN WITH MINERALS) TABS tablet Take 1 tablet by mouth daily.     raloxifene  (EVISTA ) 60 MG  tablet Take 60 mg by mouth every morning.     Tiotropium Bromide  Monohydrate (SPIRIVA  RESPIMAT) 2.5 MCG/ACT AERS Inhale 1 puff into the lungs daily.     traZODone  (DESYREL ) 100 MG tablet Take 1 tablet (100 mg total) by mouth at bedtime as needed for sleep. (Patient taking differently: Take 100 mg by mouth at bedtime.) 10 tablet 0   No current facility-administered medications for this encounter.    Physical Findings: The patient is in no acute distress. Patient is alert and oriented.  vitals were not taken for this visit. .  No significant changes. Lungs are clear to auscultation bilaterally. Heart has regular rate and rhythm. No palpable cervical, supraclavicular, or axillary adenopathy. Abdomen soft, non-tender, normal bowel sounds.   Lab Findings: Lab Results  Component Value Date   WBC 7.0 03/26/2024   HGB 9.7 (L) 03/26/2024   HCT 32.7 (L) 03/26/2024   MCV 96.2 03/26/2024   PLT 275 03/26/2024    Radiographic Findings: CT CHEST WO CONTRAST Result Date: 05/08/2024 CLINICAL DATA:  Non-small-cell lung cancer. Restaging. * Tracking Code: BO * EXAM: CT CHEST WITHOUT CONTRAST TECHNIQUE: Multidetector CT imaging of the chest was performed following the standard protocol without IV contrast. RADIATION DOSE REDUCTION: This exam was performed according to the departmental dose-optimization program which includes automated exposure control, adjustment of the mA and/or kV according to patient size and/or use of iterative reconstruction technique. COMPARISON:  03/26/2024 FINDINGS: Cardiovascular: The heart size is normal. No substantial pericardial effusion. Coronary artery calcification is evident. Moderate atherosclerotic calcification is noted in the wall of the thoracic aorta. Mediastinum/Nodes: No mediastinal lymphadenopathy. No evidence for gross hilar lymphadenopathy although assessment is limited by the lack of intravenous contrast on the current study. The esophagus has normal imaging features.  There is no axillary lymphadenopathy. Lungs/Pleura: Centrilobular and paraseptal emphysema evident. Spiculated left lower lobe pulmonary nodule measures 11 x 10 mm today compared to 12 x 10 mm previously. Architectural distortion with subtle 6 mm ground-glass nodule left upper lobe on 31/8 is stable. The 2.0 x 1.6 cm ground-glass component to this lesion measured previously has decreased in the interval. 4 mm posterior left upper lobe nodule on 35/8 is similar to prior. 4 mm right upper lobe nodule on 47/8 is similar. Scattered calcified granulomata are again noted bilaterally. No focal airspace consolidation. There is no evidence of pleural effusion. Upper Abdomen: Multiple low-density liver lesions scattered through both hepatic lobes are stable, likely cysts. Some of these lesions are too small to characterize but are statistically most likely benign. Musculoskeletal: No worrisome lytic or sclerotic osseous abnormality. IMPRESSION: 1. No substantial interval change in the spiculated left lower lobe pulmonary nodule.  2. Stable 6 mm ground-glass nodule left upper lobe. The 2.0 x 1.6 cm ground-glass component to this lesion measured previously has decreased in the interval. 3. Additional tiny bilateral pulmonary nodules are stable. 4. Aortic Atherosclerosis (ICD10-I70.0) and Emphysema (ICD10-J43.9). Electronically Signed   By: Camellia Candle M.D.   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  ***  Plan:  ***   *** minutes of total time was spent for this patient encounter, including preparation, face-to-face counseling with the patient and coordination of care, physical exam, and documentation of the encounter. ____________________________________  Lynwood CHARM Nasuti, PhD, MD  This document serves as a record of services personally performed by Lynwood Nasuti, MD. It was created on his behalf by  Reymundo Cartwright, a trained medical scribe. The creation of this record is based on the scribe's personal observations and the provider's statements to them. This document has been checked and approved by the attending provider.

## 2024-05-09 ENCOUNTER — Ambulatory Visit
Admission: RE | Admit: 2024-05-09 | Discharge: 2024-05-09 | Disposition: A | Discharge status: Home / Self Care | Payer: Self-pay | Source: Ambulatory Visit | Attending: Radiation Oncology | Admitting: Radiation Oncology

## 2024-05-09 DIAGNOSIS — Z923 Personal history of irradiation: Secondary | ICD-10-CM | POA: Insufficient documentation

## 2024-05-09 DIAGNOSIS — C3432 Malignant neoplasm of lower lobe, left bronchus or lung: Secondary | ICD-10-CM | POA: Diagnosis not present

## 2024-05-09 DIAGNOSIS — Z79899 Other long term (current) drug therapy: Secondary | ICD-10-CM | POA: Diagnosis not present

## 2024-05-09 DIAGNOSIS — Z7982 Long term (current) use of aspirin: Secondary | ICD-10-CM | POA: Diagnosis not present

## 2024-05-09 DIAGNOSIS — J432 Centrilobular emphysema: Secondary | ICD-10-CM | POA: Insufficient documentation

## 2024-05-09 DIAGNOSIS — Z7951 Long term (current) use of inhaled steroids: Secondary | ICD-10-CM | POA: Diagnosis not present

## 2024-05-09 DIAGNOSIS — Z791 Long term (current) use of non-steroidal anti-inflammatories (NSAID): Secondary | ICD-10-CM | POA: Insufficient documentation

## 2024-05-09 DIAGNOSIS — K769 Liver disease, unspecified: Secondary | ICD-10-CM | POA: Diagnosis not present

## 2024-05-09 DIAGNOSIS — Z79810 Long term (current) use of selective estrogen receptor modulators (SERMs): Secondary | ICD-10-CM | POA: Diagnosis not present

## 2024-05-09 DIAGNOSIS — I700 Atherosclerosis of aorta: Secondary | ICD-10-CM | POA: Diagnosis not present

## 2024-05-09 DIAGNOSIS — I2510 Atherosclerotic heart disease of native coronary artery without angina pectoris: Secondary | ICD-10-CM | POA: Insufficient documentation

## 2024-05-09 DIAGNOSIS — R000 Tachycardia, unspecified: Secondary | ICD-10-CM | POA: Insufficient documentation

## 2024-05-09 DIAGNOSIS — Z87891 Personal history of nicotine dependence: Secondary | ICD-10-CM | POA: Diagnosis not present

## 2024-05-09 NOTE — Progress Notes (Signed)
 Katie Waller is here today for follow up post radiation to the lung.  Lung Side: Left, patient completed treatment on 06/26/23.  Does the patient complain of any of the following: Pain:Denies Shortness of breath w/wo exertion: Yes, o2 @5  liters Cough: Denies Hemoptysis: Denies Pain with swallowing: Denies Swallowing/choking concerns: Denies Appetite: Good Energy Level: Low Post radiation skin Changes: Denies    Additional comments if applicable: No questions or concerns.  BP (P) 133/76 (BP Location: Left Arm, Patient Position: Sitting)   Pulse (!) (P) 120   Temp (!) (P) 97.5 F (36.4 C) (Temporal)   Resp (P) 20   Ht (P) 5' 1 (1.549 m)   Wt (P) 146 lb (66.2 kg)   BMI (P) 27.59 kg/m

## 2024-05-27 DIAGNOSIS — R7303 Prediabetes: Secondary | ICD-10-CM | POA: Diagnosis not present

## 2024-05-27 DIAGNOSIS — Z23 Encounter for immunization: Secondary | ICD-10-CM | POA: Diagnosis not present

## 2024-05-27 DIAGNOSIS — E7800 Pure hypercholesterolemia, unspecified: Secondary | ICD-10-CM | POA: Diagnosis not present

## 2024-05-27 DIAGNOSIS — I5032 Chronic diastolic (congestive) heart failure: Secondary | ICD-10-CM | POA: Diagnosis not present

## 2024-05-27 DIAGNOSIS — D509 Iron deficiency anemia, unspecified: Secondary | ICD-10-CM | POA: Diagnosis not present

## 2024-05-27 DIAGNOSIS — I110 Hypertensive heart disease with heart failure: Secondary | ICD-10-CM | POA: Diagnosis not present

## 2024-05-27 DIAGNOSIS — Z0000 Encounter for general adult medical examination without abnormal findings: Secondary | ICD-10-CM | POA: Diagnosis not present

## 2024-05-27 DIAGNOSIS — M810 Age-related osteoporosis without current pathological fracture: Secondary | ICD-10-CM | POA: Diagnosis not present

## 2024-05-27 DIAGNOSIS — J449 Chronic obstructive pulmonary disease, unspecified: Secondary | ICD-10-CM | POA: Diagnosis not present

## 2024-05-27 DIAGNOSIS — I700 Atherosclerosis of aorta: Secondary | ICD-10-CM | POA: Diagnosis not present

## 2024-05-27 DIAGNOSIS — R202 Paresthesia of skin: Secondary | ICD-10-CM | POA: Diagnosis not present

## 2024-06-11 ENCOUNTER — Encounter: Payer: Self-pay | Admitting: Pulmonary Disease

## 2024-06-11 ENCOUNTER — Ambulatory Visit: Admitting: Pulmonary Disease

## 2024-06-11 VITALS — BP 140/60 | HR 61 | Temp 98.4°F | Ht 61.0 in | Wt 150.0 lb

## 2024-06-11 DIAGNOSIS — Z9981 Dependence on supplemental oxygen: Secondary | ICD-10-CM | POA: Diagnosis not present

## 2024-06-11 DIAGNOSIS — R911 Solitary pulmonary nodule: Secondary | ICD-10-CM | POA: Diagnosis not present

## 2024-06-11 DIAGNOSIS — J441 Chronic obstructive pulmonary disease with (acute) exacerbation: Secondary | ICD-10-CM

## 2024-06-11 NOTE — Patient Instructions (Signed)
  VISIT SUMMARY: You had a follow-up visit to discuss your lung cancer treatment and respiratory symptoms. Your lung nodules remain stable after radiation therapy, and your COPD treatment plan has been adjusted to help manage your breathing difficulties.  YOUR PLAN: SEVERE CHRONIC OBSTRUCTIVE PULMONARY DISEASE (COPD) WITH SUPPLEMENTAL OXYGEN  DEPENDENCE: You have severe COPD and need supplemental oxygen . You are experiencing variable breathing difficulties. -Start using Ohtuvayre  nebulizer twice daily to reduce lung inflammation and improve symptoms. -Continue using your current inhalers: Symbicort  and Spiriva . -Refill your albuterol  nebulizer solution as needed. -Try virtual rehab exercises at home to help with your breathing. -Your home oxygen  level has been increased to 5.5 L/min. -Schedule a follow-up appointment in six months.  SQUAMOUS CELL CARCINOMA OF LEFT LOWER LOBE OF LUNG, POST-RADIATION: You have squamous cell carcinoma in the left lower lobe of your lung. You completed three rounds of radiation therapy, and recent CT scans show that your lung nodules are stable. -Continue monitoring with regular CT scans.  IMPAIRED MOBILITY WITH WHEELCHAIR DEPENDENCE: You have impaired mobility and are dependent on a wheelchair. -Try virtual rehab exercises at home to improve your mobility and potentially aid in your breathing.                                 Contains text generated by [employer].

## 2024-06-11 NOTE — Progress Notes (Signed)
 Katie Waller    [id number]    [birth year]  Primary Care Physician:Sun, Vyvyan, MD  Referring Physician: Sun, Vyvyan, MD [phone] [address] [place],  [place] [address]  Chief complaint: Follow-up for COPD GOLD D  HPI: 78 y.o.  with severe COPD [CAT Score 13].   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  She is placed on BiPAP, treated with steroids, antibiotics, nebulizer.  Also noted to have acute on chronic diastolic heart failure which was treated with IV Lasix .  Discharged to rehab facility.  She is since returned to home and continues with exercise regimen at home with home health.    Did not like Brovana  and Pulmicort  since she does not like a mask on the face Tried Trelegy in march 2019 but preferred to go back on symbicort  and spiriva   Interim History: Discussed the use of AI scribe software for clinical note transcription with the patient, who gave verbal consent to proceed.  History of Present Illness Katie Waller is a 78 year old female with squamous cell carcinoma of the left lower lobe and severe COPD who presents for follow-up of her lung cancer treatment and respiratory symptoms.  Pulmonary neoplasm - Diagnosed with squamous cell carcinoma of the left lower lobe in September 2025 - Completed three rounds of radiation therapy - Follow-up CT scans show stable lung nodules  Dyspnea and hypoxemia - Experiences variable breathing difficulties with episodes of gasping for breath - Home oxygen  increased to 5.5 L/min  Chronic obstructive pulmonary disease (copd) - Severe COPD - Uses Symbicort  and Spiriva  inhalers regularly - Uses nebulizer at home with albuterol  and duodenum mix as needed - Previously attempted pulmonary rehabilitation in 2020 - Currently wheelchair dependent    Relevant pulmonary history Pets: Dogs.  No cats, birds, farm animals Occupation: Retired as an Print production planner.    Exposures: No known exposures, no mold, hot tub. Smoking history: 55-pack-year smoking.  Quit in November 2018 Travel History: Not significant  Outpatient Encounter Medications as of 06/11/2024  Medication Sig   acetaminophen  (TYLENOL ) 500 MG tablet Take 1,000 mg by mouth every 6 (six) hours as needed for moderate pain.   albuterol  (PROVENTIL  HFA;VENTOLIN  HFA) 108 (90 BASE) MCG/ACT inhaler Inhale 2 puffs into the lungs every 6 (six) hours as needed for wheezing or shortness of breath.   aspirin  81 MG chewable tablet Chew 1 tablet (81 mg total) by mouth daily.   atorvastatin  (LIPITOR) 10 MG tablet TAKE 1 TABLET BY MOUTH EVERY DAY   benzonatate  (TESSALON ) 100 MG capsule Take 100 mg by mouth 3 (three) times daily as needed for cough.    Biotin 10 MG CAPS Take 10 mg by mouth daily.   budesonide -formoterol  (SYMBICORT ) 160-4.5 MCG/ACT inhaler Inhale 2 puffs into the lungs 2 (two) times daily.   calcium  carbonate (OSCAL) 1500 (600 Ca) MG TABS tablet Take 600 mg of elemental calcium  by mouth daily.   Cholecalciferol (VITAMIN D ) 50 MCG (2000 UT) tablet Take 2,000 Units by mouth daily.   furosemide  (LASIX ) 40 MG tablet Take 40 mg by mouth daily.   gabapentin (NEURONTIN) 300 MG capsule Take 900 mg by mouth at bedtime.   iron polysaccharides (NIFEREX) 150 MG capsule Take 150 mg by mouth daily.   LORazepam  (ATIVAN ) 1 MG tablet Take 1 tablet (1 mg total) by mouth every 6 (six) hours as needed for anxiety.   losartan  (COZAAR ) 50 MG  tablet Take 50 mg by mouth daily.   Multiple Vitamin (MULTIVITAMIN WITH MINERALS) TABS tablet Take 1 tablet by mouth daily.   raloxifene  (EVISTA ) 60 MG tablet Take 60 mg by mouth every morning.   Tiotropium Bromide  Monohydrate (SPIRIVA  RESPIMAT) 2.5 MCG/ACT AERS Inhale 1 puff into the lungs daily.   traZODone  (DESYREL ) 100 MG tablet Take 1 tablet (100 mg total) by mouth at bedtime as needed for sleep.   diclofenac  Sodium (VOLTAREN ) 1 % GEL Apply 4 g topically 4 (four) times  daily. (Patient not taking: Reported on 06/11/2024)   morphine  (MSIR) 15 MG tablet Take 0.5 tablets (7.5 mg total) by mouth every 4 (four) hours as needed for severe pain (pain score 7-10). (Patient not taking: Reported on 06/11/2024)   No facility-administered encounter medications on file as of 06/11/2024.   Vitals:   06/11/24 1555  BP: (!) 140/60  Pulse: 61  Temp: 98.4 F (36.9 C)  Height: 5' 1 (1.549 m)  Weight: 150 lb (68 kg)  SpO2: 97% Comment: 5L POC  TempSrc: Oral  BMI (Calculated): 28.36     Physical Exam GEN: No acute distress CV: Regular rate and rhythm no murmurs LUNGS: Clear to auscultation bilaterally normal respiratory effort SKIN JOINTS: Warm and dry no rash    Data Reviewed: Imaging CT chest 07/31/17-no pulmonary embolism, emphysematous changes, dependent atelectasis.  Low-density lesion in the liver.  Screening CT chest 04/18/2022-moderate emphysema, stable pulmonary nodules.  Small pericardial effusion, atherosclerosis.  CT chest 05/08/2024-stable size of spiculated left lower lobe pulm nodule.  Stable 6 mm ground glass nodule in the left upper lobe.  Centrilobular and paraseptal emphysema. I have reviewed the images personally.   PFTs  10/02/17 FVC 1.6 [60%], FEV1 0.73 [36%], F/F 45, TLC 129%, 3/0 369%, DLCO 33% Severe obstruction, bronchodilator response, hyperinflation with air trapping, severe diffusion impairment.  Labs: CBC differential 12/24/2018-WBC 11.3, eos 1%, absolute eosinophil count 113 IgE 05/06/2018-133 Alpha-1 antitrypsin 09/05/2017-178, PI MM  Cardiac Echo 11/22/2018 LVEF 55-60%, mild LVH, no pulmonary hypertension  Assessment & Plan Severe chronic obstructive pulmonary disease (COPD) with supplemental oxygen  dependence Severe COPD with supplemental oxygen  dependence, experiencing variable breathing difficulties. Current treatment includes Symbicort  and Spiriva  inhalers, with oxygen  levels increased to 5.5 L/min at home.  Previously  completed pulmonary rehab in 2020 and does not want to try in person therapy again - Prescribe Ohtuvayre  nebulizer twice daily to reduce lung inflammation and improve symptoms. - Recommend virtual rehab exercises at home to aid in breathing. - Continue current inhalers: Symbicort  and Spiriva . - Refill albuterol  nebulizer solution as needed. - Schedule follow-up in six months.  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 Completed three rounds of radiation therapy. Recent CT scans show stable lung nodules with no change in size. - Continue monitoring with regular CT scans.  Plan/Recommendations: - Continue Symbicort , Spiriva  - Prescribe Ohtuvayre , virtual pulmonary rehab - [facility]  I personally spent a total of 30 minutes in the care of the patient today including preparing to see the patient, getting/reviewing separately obtained history, performing a medically appropriate exam/evaluation, counseling and educating, placing orders, and independently interpreting results.   Ginni Eichler MD [facility] 06/11/2024, 4:06 PM  CC: Sun, Vyvyan, MD

## 2024-06-13 ENCOUNTER — Telehealth: Payer: Self-pay

## 2024-06-13 NOTE — Telephone Encounter (Signed)
 Received fax from [employer] confirming receipt of enrollment form  Patient ID: [id number]

## 2024-06-13 NOTE — Telephone Encounter (Signed)
 Received Ohtuvayre  new start paperwork. Completed form and faxed with clinicals and insurance card copy to [facility]   Phone#: [phone] Fax#: [phone]

## 2024-06-17 DIAGNOSIS — J449 Chronic obstructive pulmonary disease, unspecified: Secondary | ICD-10-CM | POA: Diagnosis not present

## 2024-06-17 NOTE — Telephone Encounter (Signed)
 Received fax from [employer] with summary of benefits. Referral form for Ohtuvayre  received. Rx will be triaged to [facility].. Once benefits investigation completed, pharmacy will reach out the patient to schedule shipment. If medication is unaffordable, patient will need to express financial hardship to be referred back to [facility] for patient assistance program pre-screening.   Patient ID: [id number] Pharmacy phone: [phone]  [facility] Phone#: [phone]

## 2024-06-18 NOTE — Telephone Encounter (Signed)
 Received fax from [facility] - prescription received.

## 2024-06-25 ENCOUNTER — Inpatient Hospital Stay (HOSPITAL_COMMUNITY)
Admission: EM | Admit: 2024-06-25 | Discharge: 2024-08-05 | DRG: 004 | Disposition: E | Attending: Internal Medicine | Admitting: Internal Medicine

## 2024-06-25 ENCOUNTER — Other Ambulatory Visit: Payer: Self-pay

## 2024-06-25 ENCOUNTER — Emergency Department (HOSPITAL_COMMUNITY)

## 2024-06-25 ENCOUNTER — Inpatient Hospital Stay (HOSPITAL_COMMUNITY)

## 2024-06-25 ENCOUNTER — Encounter (HOSPITAL_COMMUNITY): Payer: Self-pay

## 2024-06-25 DIAGNOSIS — Z9911 Dependence on respirator [ventilator] status: Secondary | ICD-10-CM

## 2024-06-25 DIAGNOSIS — T3695XA Adverse effect of unspecified systemic antibiotic, initial encounter: Secondary | ICD-10-CM | POA: Diagnosis not present

## 2024-06-25 DIAGNOSIS — E875 Hyperkalemia: Secondary | ICD-10-CM | POA: Diagnosis present

## 2024-06-25 DIAGNOSIS — N170 Acute kidney failure with tubular necrosis: Secondary | ICD-10-CM | POA: Diagnosis not present

## 2024-06-25 DIAGNOSIS — R6521 Severe sepsis with septic shock: Secondary | ICD-10-CM | POA: Diagnosis not present

## 2024-06-25 DIAGNOSIS — R0602 Shortness of breath: Secondary | ICD-10-CM | POA: Diagnosis not present

## 2024-06-25 DIAGNOSIS — A419 Sepsis, unspecified organism: Secondary | ICD-10-CM | POA: Diagnosis not present

## 2024-06-25 DIAGNOSIS — Z1152 Encounter for screening for COVID-19: Secondary | ICD-10-CM

## 2024-06-25 DIAGNOSIS — Z7901 Long term (current) use of anticoagulants: Secondary | ICD-10-CM

## 2024-06-25 DIAGNOSIS — J123 Human metapneumovirus pneumonia: Secondary | ICD-10-CM | POA: Diagnosis present

## 2024-06-25 DIAGNOSIS — N179 Acute kidney failure, unspecified: Secondary | ICD-10-CM

## 2024-06-25 DIAGNOSIS — Z923 Personal history of irradiation: Secondary | ICD-10-CM

## 2024-06-25 DIAGNOSIS — L89156 Pressure-induced deep tissue damage of sacral region: Secondary | ICD-10-CM | POA: Diagnosis not present

## 2024-06-25 DIAGNOSIS — Z7982 Long term (current) use of aspirin: Secondary | ICD-10-CM

## 2024-06-25 DIAGNOSIS — J90 Pleural effusion, not elsewhere classified: Secondary | ICD-10-CM | POA: Diagnosis not present

## 2024-06-25 DIAGNOSIS — J9622 Acute and chronic respiratory failure with hypercapnia: Secondary | ICD-10-CM | POA: Diagnosis present

## 2024-06-25 DIAGNOSIS — I6523 Occlusion and stenosis of bilateral carotid arteries: Secondary | ICD-10-CM | POA: Diagnosis not present

## 2024-06-25 DIAGNOSIS — L89616 Pressure-induced deep tissue damage of right heel: Secondary | ICD-10-CM | POA: Diagnosis not present

## 2024-06-25 DIAGNOSIS — J9621 Acute and chronic respiratory failure with hypoxia: Secondary | ICD-10-CM | POA: Diagnosis not present

## 2024-06-25 DIAGNOSIS — I708 Atherosclerosis of other arteries: Secondary | ICD-10-CM | POA: Diagnosis not present

## 2024-06-25 DIAGNOSIS — R000 Tachycardia, unspecified: Secondary | ICD-10-CM | POA: Diagnosis not present

## 2024-06-25 DIAGNOSIS — N182 Chronic kidney disease, stage 2 (mild): Secondary | ICD-10-CM

## 2024-06-25 DIAGNOSIS — I132 Hypertensive heart and chronic kidney disease with heart failure and with stage 5 chronic kidney disease, or end stage renal disease: Secondary | ICD-10-CM | POA: Diagnosis present

## 2024-06-25 DIAGNOSIS — K56609 Unspecified intestinal obstruction, unspecified as to partial versus complete obstruction: Secondary | ICD-10-CM

## 2024-06-25 DIAGNOSIS — M810 Age-related osteoporosis without current pathological fracture: Secondary | ICD-10-CM | POA: Diagnosis present

## 2024-06-25 DIAGNOSIS — I7300 Raynaud's syndrome without gangrene: Secondary | ICD-10-CM | POA: Diagnosis present

## 2024-06-25 DIAGNOSIS — E43 Unspecified severe protein-calorie malnutrition: Secondary | ICD-10-CM | POA: Diagnosis present

## 2024-06-25 DIAGNOSIS — I5032 Chronic diastolic (congestive) heart failure: Secondary | ICD-10-CM | POA: Diagnosis not present

## 2024-06-25 DIAGNOSIS — J440 Chronic obstructive pulmonary disease with acute lower respiratory infection: Secondary | ICD-10-CM | POA: Diagnosis present

## 2024-06-25 DIAGNOSIS — K567 Ileus, unspecified: Secondary | ICD-10-CM | POA: Diagnosis not present

## 2024-06-25 DIAGNOSIS — E873 Alkalosis: Secondary | ICD-10-CM | POA: Diagnosis present

## 2024-06-25 DIAGNOSIS — E869 Volume depletion, unspecified: Secondary | ICD-10-CM | POA: Diagnosis not present

## 2024-06-25 DIAGNOSIS — D631 Anemia in chronic kidney disease: Secondary | ICD-10-CM | POA: Diagnosis not present

## 2024-06-25 DIAGNOSIS — R29818 Other symptoms and signs involving the nervous system: Secondary | ICD-10-CM | POA: Diagnosis not present

## 2024-06-25 DIAGNOSIS — Z9981 Dependence on supplemental oxygen: Secondary | ICD-10-CM

## 2024-06-25 DIAGNOSIS — E7800 Pure hypercholesterolemia, unspecified: Secondary | ICD-10-CM | POA: Diagnosis present

## 2024-06-25 DIAGNOSIS — J449 Chronic obstructive pulmonary disease, unspecified: Secondary | ICD-10-CM

## 2024-06-25 DIAGNOSIS — Z87891 Personal history of nicotine dependence: Secondary | ICD-10-CM

## 2024-06-25 DIAGNOSIS — E876 Hypokalemia: Secondary | ICD-10-CM | POA: Diagnosis present

## 2024-06-25 DIAGNOSIS — F17200 Nicotine dependence, unspecified, uncomplicated: Secondary | ICD-10-CM | POA: Diagnosis not present

## 2024-06-25 DIAGNOSIS — R569 Unspecified convulsions: Secondary | ICD-10-CM | POA: Diagnosis not present

## 2024-06-25 DIAGNOSIS — I6503 Occlusion and stenosis of bilateral vertebral arteries: Secondary | ICD-10-CM | POA: Diagnosis not present

## 2024-06-25 DIAGNOSIS — N186 End stage renal disease: Secondary | ICD-10-CM | POA: Diagnosis not present

## 2024-06-25 DIAGNOSIS — Z7951 Long term (current) use of inhaled steroids: Secondary | ICD-10-CM

## 2024-06-25 DIAGNOSIS — Z79899 Other long term (current) drug therapy: Secondary | ICD-10-CM

## 2024-06-25 DIAGNOSIS — I671 Cerebral aneurysm, nonruptured: Secondary | ICD-10-CM | POA: Diagnosis not present

## 2024-06-25 DIAGNOSIS — E1165 Type 2 diabetes mellitus with hyperglycemia: Secondary | ICD-10-CM | POA: Diagnosis present

## 2024-06-25 DIAGNOSIS — E871 Hypo-osmolality and hyponatremia: Secondary | ICD-10-CM | POA: Diagnosis present

## 2024-06-25 DIAGNOSIS — J930 Spontaneous tension pneumothorax: Secondary | ICD-10-CM | POA: Diagnosis not present

## 2024-06-25 DIAGNOSIS — J129 Viral pneumonia, unspecified: Secondary | ICD-10-CM

## 2024-06-25 DIAGNOSIS — Z888 Allergy status to other drugs, medicaments and biological substances status: Secondary | ICD-10-CM

## 2024-06-25 DIAGNOSIS — L89626 Pressure-induced deep tissue damage of left heel: Secondary | ICD-10-CM | POA: Diagnosis not present

## 2024-06-25 DIAGNOSIS — J441 Chronic obstructive pulmonary disease with (acute) exacerbation: Secondary | ICD-10-CM | POA: Diagnosis not present

## 2024-06-25 DIAGNOSIS — Z66 Do not resuscitate: Secondary | ICD-10-CM | POA: Diagnosis not present

## 2024-06-25 DIAGNOSIS — E1151 Type 2 diabetes mellitus with diabetic peripheral angiopathy without gangrene: Secondary | ICD-10-CM | POA: Diagnosis present

## 2024-06-25 DIAGNOSIS — I480 Paroxysmal atrial fibrillation: Secondary | ICD-10-CM | POA: Diagnosis present

## 2024-06-25 DIAGNOSIS — I998 Other disorder of circulatory system: Secondary | ICD-10-CM

## 2024-06-25 DIAGNOSIS — Z4682 Encounter for fitting and adjustment of non-vascular catheter: Secondary | ICD-10-CM | POA: Diagnosis not present

## 2024-06-25 DIAGNOSIS — I70223 Atherosclerosis of native arteries of extremities with rest pain, bilateral legs: Secondary | ICD-10-CM | POA: Diagnosis not present

## 2024-06-25 DIAGNOSIS — Z8673 Personal history of transient ischemic attack (TIA), and cerebral infarction without residual deficits: Secondary | ICD-10-CM

## 2024-06-25 DIAGNOSIS — J439 Emphysema, unspecified: Secondary | ICD-10-CM | POA: Diagnosis not present

## 2024-06-25 DIAGNOSIS — I4891 Unspecified atrial fibrillation: Secondary | ICD-10-CM | POA: Diagnosis not present

## 2024-06-25 DIAGNOSIS — Z85118 Personal history of other malignant neoplasm of bronchus and lung: Secondary | ICD-10-CM

## 2024-06-25 DIAGNOSIS — Z8249 Family history of ischemic heart disease and other diseases of the circulatory system: Secondary | ICD-10-CM

## 2024-06-25 DIAGNOSIS — T17908A Unspecified foreign body in respiratory tract, part unspecified causing other injury, initial encounter: Secondary | ICD-10-CM

## 2024-06-25 DIAGNOSIS — T4275XA Adverse effect of unspecified antiepileptic and sedative-hypnotic drugs, initial encounter: Secondary | ICD-10-CM | POA: Diagnosis not present

## 2024-06-25 DIAGNOSIS — G8929 Other chronic pain: Secondary | ICD-10-CM | POA: Diagnosis present

## 2024-06-25 DIAGNOSIS — Z789 Other specified health status: Secondary | ICD-10-CM

## 2024-06-25 DIAGNOSIS — J189 Pneumonia, unspecified organism: Secondary | ICD-10-CM

## 2024-06-25 DIAGNOSIS — L8990 Pressure ulcer of unspecified site, unspecified stage: Secondary | ICD-10-CM | POA: Insufficient documentation

## 2024-06-25 DIAGNOSIS — J939 Pneumothorax, unspecified: Secondary | ICD-10-CM

## 2024-06-25 DIAGNOSIS — Z881 Allergy status to other antibiotic agents status: Secondary | ICD-10-CM

## 2024-06-25 DIAGNOSIS — E8809 Other disorders of plasma-protein metabolism, not elsewhere classified: Secondary | ICD-10-CM | POA: Diagnosis present

## 2024-06-25 DIAGNOSIS — M4802 Spinal stenosis, cervical region: Secondary | ICD-10-CM | POA: Diagnosis present

## 2024-06-25 DIAGNOSIS — Z6832 Body mass index (BMI) 32.0-32.9, adult: Secondary | ICD-10-CM

## 2024-06-25 DIAGNOSIS — I952 Hypotension due to drugs: Secondary | ICD-10-CM | POA: Diagnosis not present

## 2024-06-25 DIAGNOSIS — R739 Hyperglycemia, unspecified: Secondary | ICD-10-CM

## 2024-06-25 DIAGNOSIS — Z452 Encounter for adjustment and management of vascular access device: Secondary | ICD-10-CM | POA: Diagnosis not present

## 2024-06-25 DIAGNOSIS — F419 Anxiety disorder, unspecified: Secondary | ICD-10-CM | POA: Diagnosis not present

## 2024-06-25 DIAGNOSIS — Z885 Allergy status to narcotic agent status: Secondary | ICD-10-CM

## 2024-06-25 DIAGNOSIS — R918 Other nonspecific abnormal finding of lung field: Secondary | ICD-10-CM | POA: Diagnosis not present

## 2024-06-25 DIAGNOSIS — E1122 Type 2 diabetes mellitus with diabetic chronic kidney disease: Secondary | ICD-10-CM | POA: Diagnosis present

## 2024-06-25 DIAGNOSIS — M1990 Unspecified osteoarthritis, unspecified site: Secondary | ICD-10-CM | POA: Diagnosis present

## 2024-06-25 DIAGNOSIS — Z930 Tracheostomy status: Secondary | ICD-10-CM

## 2024-06-25 DIAGNOSIS — G9341 Metabolic encephalopathy: Secondary | ICD-10-CM | POA: Diagnosis not present

## 2024-06-25 DIAGNOSIS — J811 Chronic pulmonary edema: Secondary | ICD-10-CM | POA: Diagnosis not present

## 2024-06-25 DIAGNOSIS — Z8349 Family history of other endocrine, nutritional and metabolic diseases: Secondary | ICD-10-CM

## 2024-06-25 DIAGNOSIS — L89153 Pressure ulcer of sacral region, stage 3: Secondary | ICD-10-CM | POA: Diagnosis not present

## 2024-06-25 DIAGNOSIS — I959 Hypotension, unspecified: Secondary | ICD-10-CM

## 2024-06-25 DIAGNOSIS — R54 Age-related physical debility: Secondary | ICD-10-CM | POA: Diagnosis present

## 2024-06-25 DIAGNOSIS — J159 Unspecified bacterial pneumonia: Secondary | ICD-10-CM | POA: Diagnosis present

## 2024-06-25 DIAGNOSIS — Z515 Encounter for palliative care: Secondary | ICD-10-CM

## 2024-06-25 DIAGNOSIS — Z992 Dependence on renal dialysis: Secondary | ICD-10-CM

## 2024-06-25 LAB — GLUCOSE, CAPILLARY
Glucose-Capillary: 143 mg/dL — ABNORMAL HIGH (ref 70–99)
Glucose-Capillary: 151 mg/dL — ABNORMAL HIGH (ref 70–99)
Glucose-Capillary: 157 mg/dL — ABNORMAL HIGH (ref 70–99)
Glucose-Capillary: 201 mg/dL — ABNORMAL HIGH (ref 70–99)

## 2024-06-25 LAB — POCT I-STAT 7, (LYTES, BLD GAS, ICA,H+H)
Acid-Base Excess: 3 mmol/L — ABNORMAL HIGH (ref 0.0–2.0)
Acid-Base Excess: 4 mmol/L — ABNORMAL HIGH (ref 0.0–2.0)
Bicarbonate: 29.7 mmol/L — ABNORMAL HIGH (ref 20.0–28.0)
Bicarbonate: 29.6 mmol/L — ABNORMAL HIGH (ref 20.0–28.0)
Calcium, Ion: 1.18 mmol/L (ref 1.15–1.40)
Calcium, Ion: 1.17 mmol/L (ref 1.15–1.40)
HCT: 42 % (ref 36.0–46.0)
HCT: 29 % — ABNORMAL LOW (ref 36.0–46.0)
Hemoglobin: 14.3 g/dL (ref 12.0–15.0)
Hemoglobin: 9.9 g/dL — ABNORMAL LOW (ref 12.0–15.0)
O2 Saturation: 100 %
O2 Saturation: 98 %
Patient temperature: 39.7
Patient temperature: 39.7
Potassium: 4.8 mmol/L (ref 3.5–5.1)
Potassium: 4.8 mmol/L (ref 3.5–5.1)
Sodium: 136 mmol/L (ref 135–145)
Sodium: 135 mmol/L (ref 135–145)
TCO2: 31 mmol/L (ref 22–32)
TCO2: 31 mmol/L (ref 22–32)
pCO2 arterial: 62.3 mmHg — ABNORMAL HIGH (ref 32–48)
pCO2 arterial: 55 mmHg — ABNORMAL HIGH (ref 32–48)
pH, Arterial: 7.299 — ABNORMAL LOW (ref 7.35–7.45)
pH, Arterial: 7.352 (ref 7.35–7.45)
pO2, Arterial: 248 mmHg — ABNORMAL HIGH (ref 83–108)
pO2, Arterial: 134 mmHg — ABNORMAL HIGH (ref 83–108)

## 2024-06-25 LAB — URINALYSIS, W/ REFLEX TO CULTURE (INFECTION SUSPECTED)
Bilirubin Urine: NEGATIVE
Glucose, UA: NEGATIVE mg/dL
Ketones, ur: 5 mg/dL — AB
Leukocytes,Ua: NEGATIVE
Nitrite: NEGATIVE
Protein, ur: >=300 mg/dL — AB
Specific Gravity, Urine: 1.033 — ABNORMAL HIGH (ref 1.005–1.030)
pH: 5 (ref 5.0–8.0)

## 2024-06-25 LAB — COMPREHENSIVE METABOLIC PANEL WITH GFR
ALT: 11 U/L (ref 0–44)
AST: 24 U/L (ref 15–41)
Albumin: 3.3 g/dL — ABNORMAL LOW (ref 3.5–5.0)
Alkaline Phosphatase: 39 U/L (ref 38–126)
Anion gap: 10 (ref 5–15)
BUN: 16 mg/dL (ref 8–23)
CO2: 36 mmol/L — ABNORMAL HIGH (ref 22–32)
Calcium: 9.1 mg/dL (ref 8.9–10.3)
Chloride: 93 mmol/L — ABNORMAL LOW (ref 98–111)
Creatinine, Ser: 1.12 mg/dL — ABNORMAL HIGH (ref 0.44–1.00)
GFR, Estimated: 51 mL/min — ABNORMAL LOW (ref >60)
Glucose, Bld: 181 mg/dL — ABNORMAL HIGH (ref 70–99)
Potassium: 5.4 mmol/L — ABNORMAL HIGH (ref 3.5–5.1)
Sodium: 139 mmol/L (ref 135–145)
Total Bilirubin: 0.6 mg/dL (ref 0.0–1.2)
Total Protein: 7 g/dL (ref 6.5–8.1)

## 2024-06-25 LAB — I-STAT CHEM 8, ED
BUN: 20 mg/dL (ref 8–23)
Calcium, Ion: 1.1 mmol/L — ABNORMAL LOW (ref 1.15–1.40)
Chloride: 94 mmol/L — ABNORMAL LOW (ref 98–111)
Creatinine, Ser: 1.1 mg/dL — ABNORMAL HIGH (ref 0.44–1.00)
Glucose, Bld: 184 mg/dL — ABNORMAL HIGH (ref 70–99)
HCT: 38 % (ref 36.0–46.0)
Hemoglobin: 12.9 g/dL (ref 12.0–15.0)
Potassium: 5.2 mmol/L — ABNORMAL HIGH (ref 3.5–5.1)
Sodium: 137 mmol/L (ref 135–145)
TCO2: 37 mmol/L — ABNORMAL HIGH (ref 22–32)

## 2024-06-25 LAB — COOXEMETRY PANEL
Carboxyhemoglobin: 1.6 % — ABNORMAL HIGH (ref 0.5–1.5)
Methemoglobin: <0.7 % (ref 0.0–1.5)
O2 Saturation: 68.5 %
Total hemoglobin: 9.9 g/dL — ABNORMAL LOW (ref 12.0–16.0)

## 2024-06-25 LAB — CBC
HCT: 38.4 % (ref 36.0–46.0)
Hemoglobin: 10.8 g/dL — ABNORMAL LOW (ref 12.0–15.0)
MCH: 28.6 pg (ref 26.0–34.0)
MCHC: 28.1 g/dL — ABNORMAL LOW (ref 30.0–36.0)
MCV: 101.9 fL — ABNORMAL HIGH (ref 80.0–100.0)
Platelets: 295 K/uL (ref 150–400)
RBC: 3.77 MIL/uL — ABNORMAL LOW (ref 3.87–5.11)
RDW: 12.4 % (ref 11.5–15.5)
WBC: 9.5 K/uL (ref 4.0–10.5)
nRBC: 0 % (ref 0.0–0.2)

## 2024-06-25 LAB — APTT: aPTT: 23 s — ABNORMAL LOW (ref 24–36)

## 2024-06-25 LAB — DIFFERENTIAL
Abs Immature Granulocytes: 0.04 K/uL (ref 0.00–0.07)
Basophils Absolute: 0 K/uL (ref 0.0–0.1)
Basophils Relative: 0 %
Eosinophils Absolute: 0 K/uL (ref 0.0–0.5)
Eosinophils Relative: 0 %
Immature Granulocytes: 0 %
Lymphocytes Relative: 4 %
Lymphs Abs: 0.3 K/uL — ABNORMAL LOW (ref 0.7–4.0)
Monocytes Absolute: 0.5 K/uL (ref 0.1–1.0)
Monocytes Relative: 5 %
Neutro Abs: 8.6 K/uL — ABNORMAL HIGH (ref 1.7–7.7)
Neutrophils Relative %: 91 %

## 2024-06-25 LAB — I-STAT ARTERIAL BLOOD GAS, ED
Acid-Base Excess: 7 mmol/L — ABNORMAL HIGH (ref 0.0–2.0)
Bicarbonate: 36.2 mmol/L — ABNORMAL HIGH (ref 20.0–28.0)
Calcium, Ion: 1.23 mmol/L (ref 1.15–1.40)
HCT: 31 % — ABNORMAL LOW (ref 36.0–46.0)
Hemoglobin: 10.5 g/dL — ABNORMAL LOW (ref 12.0–15.0)
O2 Saturation: 100 %
Potassium: 4.7 mmol/L (ref 3.5–5.1)
Sodium: 139 mmol/L (ref 135–145)
TCO2: 39 mmol/L — ABNORMAL HIGH (ref 22–32)
pCO2 arterial: 82.5 mmHg (ref 32–48)
pH, Arterial: 7.25 — ABNORMAL LOW (ref 7.35–7.45)
pO2, Arterial: 269 mmHg — ABNORMAL HIGH (ref 83–108)

## 2024-06-25 LAB — PROTIME-INR
INR: 0.9 (ref 0.8–1.2)
Prothrombin Time: 13.1 s (ref 11.4–15.2)

## 2024-06-25 LAB — MAGNESIUM: Magnesium: 1.8 mg/dL (ref 1.7–2.4)

## 2024-06-25 LAB — PHOSPHORUS: Phosphorus: 2.7 mg/dL (ref 2.5–4.6)

## 2024-06-25 LAB — RESP PANEL BY RT-PCR (RSV, FLU A&B, COVID)  RVPGX2
Influenza A by PCR: NEGATIVE
Influenza B by PCR: NEGATIVE
Resp Syncytial Virus by PCR: NEGATIVE
SARS Coronavirus 2 by RT PCR: NEGATIVE

## 2024-06-25 LAB — I-STAT CG4 LACTIC ACID, ED: Lactic Acid, Venous: 1 mmol/L (ref 0.5–1.9)

## 2024-06-25 LAB — STREP PNEUMONIAE URINARY ANTIGEN: Strep Pneumo Urinary Antigen: NEGATIVE

## 2024-06-25 LAB — ETHANOL: Alcohol, Ethyl (B): <15 mg/dL (ref <15)

## 2024-06-25 LAB — MRSA NEXT GEN BY PCR, NASAL: MRSA by PCR Next Gen: NOT DETECTED

## 2024-06-25 MED ORDER — METHYLPREDNISOLONE SODIUM SUCC 125 MG IJ SOLR
100.0000 mg | Freq: Two times a day (BID) | INTRAMUSCULAR | Status: DC
Start: 2024-06-26 — End: 2024-06-25

## 2024-06-25 MED ORDER — LACTATED RINGERS IV SOLN: INTRAVENOUS | Status: AC

## 2024-06-25 MED ORDER — ONDANSETRON HCL 4 MG/2ML IJ SOLN
4.0000 mg | Freq: Four times a day (QID) | INTRAMUSCULAR | Status: DC | PRN
  Administered 2024-07-04 – 2024-07-18 (×7): 4 mg via INTRAVENOUS
  Filled 2024-06-25 (×7): qty 2

## 2024-06-25 MED ORDER — ARFORMOTEROL TARTRATE 15 MCG/2ML IN NEBU
15.0000 ug | INHALATION_SOLUTION | Freq: Two times a day (BID) | RESPIRATORY_TRACT | Status: DC
  Administered 2024-06-25 – 2024-07-31 (×67): 15 ug via RESPIRATORY_TRACT
  Filled 2024-06-25 (×67): qty 2

## 2024-06-25 MED ORDER — DEXMEDETOMIDINE HCL IN NACL 400 MCG/100ML IV SOLN
0.0000 ug/kg/h | INTRAVENOUS | Status: DC
  Administered 2024-06-25 (×2): 0.4 ug/kg/h via INTRAVENOUS
  Administered 2024-06-26: 0.8 ug/kg/h via INTRAVENOUS
  Administered 2024-06-26: 0.6 ug/kg/h via INTRAVENOUS
  Administered 2024-06-27: 1 ug/kg/h via INTRAVENOUS
  Administered 2024-06-27: 1.1 ug/kg/h via INTRAVENOUS
  Administered 2024-06-27: 1 ug/kg/h via INTRAVENOUS
  Administered 2024-06-27 – 2024-06-28 (×2): 1.1 ug/kg/h via INTRAVENOUS
  Administered 2024-06-28 (×2): 0.6 ug/kg/h via INTRAVENOUS
  Administered 2024-06-28: 1.2 ug/kg/h via INTRAVENOUS
  Administered 2024-06-29: 0.6 ug/kg/h via INTRAVENOUS
  Filled 2024-06-25 (×13): qty 100

## 2024-06-25 MED ORDER — ACETAMINOPHEN 650 MG RE SUPP: 650.0000 mg | Freq: Once | RECTAL | Status: DC

## 2024-06-25 MED ORDER — FENTANYL CITRATE (PF) 50 MCG/ML IJ SOSY
50.0000 ug | PREFILLED_SYRINGE | INTRAMUSCULAR | Status: DC | PRN
  Administered 2024-06-25 – 2024-06-30 (×16): 50 ug via INTRAVENOUS
  Filled 2024-06-25 (×15): qty 1

## 2024-06-25 MED ORDER — PANTOPRAZOLE SODIUM 40 MG IV SOLR
40.0000 mg | INTRAVENOUS | Status: DC
  Administered 2024-06-25 – 2024-07-08 (×14): 40 mg via INTRAVENOUS
  Filled 2024-06-25 (×14): qty 10

## 2024-06-25 MED ORDER — ROCURONIUM BROMIDE 10 MG/ML (PF) SYRINGE
PREFILLED_SYRINGE | INTRAVENOUS | Status: DC | PRN
  Administered 2024-06-25: 100 mg via INTRAVENOUS

## 2024-06-25 MED ORDER — CHLORHEXIDINE GLUCONATE CLOTH 2 % EX PADS
6.0000 | MEDICATED_PAD | Freq: Every day | CUTANEOUS | Status: DC
  Administered 2024-06-25 – 2024-07-30 (×35): 6 via TOPICAL

## 2024-06-25 MED ORDER — REVEFENACIN 175 MCG/3ML IN SOLN
175.0000 ug | Freq: Every day | RESPIRATORY_TRACT | Status: DC
  Administered 2024-06-25 – 2024-07-18 (×24): 175 ug via RESPIRATORY_TRACT
  Filled 2024-06-25 (×26): qty 3

## 2024-06-25 MED ORDER — ORAL CARE MOUTH RINSE
15.0000 mL | OROMUCOSAL | Status: DC
  Administered 2024-06-25 – 2024-07-07 (×138): 15 mL via OROMUCOSAL

## 2024-06-25 MED ORDER — HYDROCORTISONE SOD SUC (PF) 100 MG IJ SOLR
100.0000 mg | Freq: Two times a day (BID) | INTRAMUSCULAR | Status: DC
  Administered 2024-06-25 – 2024-06-26 (×2): 100 mg via INTRAVENOUS
  Filled 2024-06-25 (×2): qty 2

## 2024-06-25 MED ORDER — NOREPINEPHRINE 4 MG/250ML-% IV SOLN
0.0000 ug/min | INTRAVENOUS | Status: DC
  Administered 2024-06-25: 4 ug/min via INTRAVENOUS
  Filled 2024-06-25 (×2): qty 250

## 2024-06-25 MED ORDER — SODIUM CHLORIDE 0.9% FLUSH
3.0000 mL | Freq: Once | INTRAVENOUS | Status: AC
  Administered 2024-06-25: 3 mL via INTRAVENOUS

## 2024-06-25 MED ORDER — NALOXONE HCL 2 MG/2ML IJ SOSY
1.0000 mg | PREFILLED_SYRINGE | Freq: Once | INTRAMUSCULAR | Status: AC
  Administered 2024-06-25: 1 mg via INTRAVENOUS

## 2024-06-25 MED ORDER — POLYETHYLENE GLYCOL 3350 17 G PO PACK
17.0000 g | PACK | Freq: Every day | ORAL | Status: DC
  Administered 2024-06-26: 17 g via ORAL
  Filled 2024-06-25: qty 1

## 2024-06-25 MED ORDER — LACTATED RINGERS IV BOLUS (SEPSIS)
1000.0000 mL | Freq: Once | INTRAVENOUS | Status: AC
  Administered 2024-06-25: 1000 mL via INTRAVENOUS

## 2024-06-25 MED ORDER — NOREPINEPHRINE 4 MG/250ML-% IV SOLN
INTRAVENOUS | Status: AC
  Administered 2024-06-25: 10 ug/min via INTRAVENOUS
  Filled 2024-06-25: qty 250

## 2024-06-25 MED ORDER — FENTANYL CITRATE (PF) 50 MCG/ML IJ SOSY: 50.0000 ug | PREFILLED_SYRINGE | INTRAMUSCULAR | Status: DC | PRN

## 2024-06-25 MED ORDER — SODIUM CHLORIDE 0.9 % IV SOLN
500.0000 mg | INTRAVENOUS | Status: AC
  Administered 2024-06-25 – 2024-06-27 (×3): 500 mg via INTRAVENOUS
  Filled 2024-06-25 (×4): qty 5

## 2024-06-25 MED ORDER — LACTATED RINGERS IV BOLUS (SEPSIS): 250.0000 mL | Freq: Once | INTRAVENOUS | Status: DC

## 2024-06-25 MED ORDER — ETOMIDATE 2 MG/ML IV SOLN
INTRAVENOUS | Status: DC | PRN
  Administered 2024-06-25: 20 mg via INTRAVENOUS

## 2024-06-25 MED ORDER — INSULIN ASPART 100 UNIT/ML IJ SOLN
0.0000 [IU] | INTRAMUSCULAR | Status: DC
  Administered 2024-06-25: 3 [IU] via SUBCUTANEOUS
  Administered 2024-06-25: 5 [IU] via SUBCUTANEOUS
  Administered 2024-06-25: 3 [IU] via SUBCUTANEOUS
  Administered 2024-06-25 – 2024-06-26 (×3): 2 [IU] via SUBCUTANEOUS
  Administered 2024-06-26 (×2): 3 [IU] via SUBCUTANEOUS
  Administered 2024-06-27: 2 [IU] via SUBCUTANEOUS
  Administered 2024-06-27 (×3): 3 [IU] via SUBCUTANEOUS
  Administered 2024-06-28: 5 [IU] via SUBCUTANEOUS
  Administered 2024-06-28: 2 [IU] via SUBCUTANEOUS
  Administered 2024-06-28: 5 [IU] via SUBCUTANEOUS
  Administered 2024-06-28: 11 [IU] via SUBCUTANEOUS
  Administered 2024-06-28: 5 [IU] via SUBCUTANEOUS
  Administered 2024-06-29 (×3): 3 [IU] via SUBCUTANEOUS
  Administered 2024-06-30: 2 [IU] via SUBCUTANEOUS

## 2024-06-25 MED ORDER — MIDAZOLAM HCL (PF) 2 MG/2ML IJ SOLN
1.0000 mg | INTRAMUSCULAR | Status: DC | PRN
  Administered 2024-06-25: 2 mg via INTRAVENOUS
  Filled 2024-06-25: qty 2

## 2024-06-25 MED ORDER — POLYETHYLENE GLYCOL 3350 17 G PO PACK: 17.0000 g | PACK | Freq: Every day | ORAL | Status: DC | PRN

## 2024-06-25 MED ORDER — VANCOMYCIN HCL IN DEXTROSE 1-5 GM/200ML-% IV SOLN: 1000.0000 mg | Freq: Once | INTRAVENOUS | Status: DC

## 2024-06-25 MED ORDER — VANCOMYCIN HCL 1250 MG/250ML IV SOLN
1250.0000 mg | Freq: Once | INTRAVENOUS | Status: DC
  Filled 2024-06-25: qty 250

## 2024-06-25 MED ORDER — SODIUM CHLORIDE 0.9 % IV SOLN
2.0000 g | Freq: Two times a day (BID) | INTRAVENOUS | Status: DC
  Administered 2024-06-25: 2 g via INTRAVENOUS
  Filled 2024-06-25: qty 12.5

## 2024-06-25 MED ORDER — METHYLPREDNISOLONE SODIUM SUCC 40 MG IJ SOLR: 40.0000 mg | Freq: Two times a day (BID) | INTRAMUSCULAR | Status: DC

## 2024-06-25 MED ORDER — VASOPRESSIN 20 UNITS/100 ML INFUSION FOR SHOCK
0.0000 [IU]/min | INTRAVENOUS | Status: DC
  Administered 2024-06-25 (×2): 0.03 [IU]/min via INTRAVENOUS
  Filled 2024-06-25 (×3): qty 100

## 2024-06-25 MED ORDER — VASOPRESSIN 20 UNITS/100 ML INFUSION FOR SHOCK
INTRAVENOUS | Status: AC
  Filled 2024-06-25: qty 100

## 2024-06-25 MED ORDER — BUDESONIDE 0.5 MG/2ML IN SUSP
0.5000 mg | Freq: Two times a day (BID) | RESPIRATORY_TRACT | Status: DC
  Administered 2024-06-25 – 2024-07-15 (×40): 0.5 mg via RESPIRATORY_TRACT
  Filled 2024-06-25 (×42): qty 2

## 2024-06-25 MED ORDER — METRONIDAZOLE 500 MG/100ML IV SOLN
500.0000 mg | Freq: Once | INTRAVENOUS | Status: AC
  Administered 2024-06-25: 500 mg via INTRAVENOUS
  Filled 2024-06-25: qty 100

## 2024-06-25 MED ORDER — HEPARIN SODIUM (PORCINE) 5000 UNIT/ML IJ SOLN
5000.0000 [IU] | Freq: Three times a day (TID) | INTRAMUSCULAR | Status: DC
  Administered 2024-06-25 – 2024-07-01 (×17): 5000 [IU] via SUBCUTANEOUS
  Filled 2024-06-25 (×17): qty 1

## 2024-06-25 MED ORDER — DOCUSATE SODIUM 100 MG PO CAPS: 100.0000 mg | ORAL_CAPSULE | Freq: Two times a day (BID) | ORAL | Status: DC | PRN

## 2024-06-25 MED ORDER — SODIUM CHLORIDE 0.9 % IV SOLN
2.0000 g | Freq: Once | INTRAVENOUS | Status: AC
  Administered 2024-06-25: 2 g via INTRAVENOUS
  Filled 2024-06-25: qty 12.5

## 2024-06-25 MED ORDER — ORAL CARE MOUTH RINSE
15.0000 mL | OROMUCOSAL | Status: DC | PRN
Start: 2024-06-25 — End: 2024-07-07

## 2024-06-25 MED ORDER — ACETAMINOPHEN 500 MG PO TABS: 1000.0000 mg | ORAL_TABLET | Freq: Three times a day (TID) | ORAL | Status: DC

## 2024-06-25 MED ORDER — SENNA 8.6 MG PO TABS
1.0000 | ORAL_TABLET | Freq: Every day | ORAL | Status: DC
  Administered 2024-06-26: 8.6 mg via ORAL
  Filled 2024-06-25: qty 1

## 2024-06-25 MED ORDER — ACETAMINOPHEN 160 MG/5ML PO SOLN
1000.0000 mg | Freq: Three times a day (TID) | ORAL | Status: DC
  Administered 2024-06-25 – 2024-06-28 (×9): 1000 mg
  Filled 2024-06-25 (×9): qty 40.6

## 2024-06-25 MED ORDER — IOHEXOL 350 MG/ML SOLN
75.0000 mL | Freq: Once | INTRAVENOUS | Status: AC | PRN
  Administered 2024-06-25: 75 mL via INTRAVENOUS

## 2024-06-25 NOTE — ED Notes (Signed)
 Levophed to go at 15mcg per Dagenhart, NP.

## 2024-06-25 NOTE — ED Notes (Addendum)
 Report given to [facility]. [facility] secretary given number for previously assigned ED RN Lonni to answer any remaining questions for RN. Pt being transported to 8[facility] by 2 Rns, paramedic and RT. ICU RN notified of vasopressin being transported with patient but not started.

## 2024-06-25 NOTE — Procedures (Signed)
 Central Venous Catheter Insertion Procedure Note  Katie Waller  [id number]  01/01/1946  Date:06/25/24  Time:1:52 PM   Provider Performing:Emmelyn Schmale H Shequila Neglia   Procedure: Insertion of Non-tunneled Central Venous [phone]) with US  guidance ([id number])   Indication(s) Medication administration  Consent Risks of the procedure as well as the alternatives and risks of each were explained to the patient and/or caregiver.  Consent for the procedure was obtained and is signed in the bedside chart  Anesthesia Topical only with 1% lidocaine    Timeout Verified patient identification, verified procedure, site/side was marked, verified correct patient position, special equipment/implants available, medications/allergies/relevant history reviewed, required imaging and test results available.  Sterile Technique Maximal sterile technique including full sterile barrier drape, hand hygiene, sterile gown, sterile gloves, mask, hair covering, sterile ultrasound probe cover (if used).  Procedure Description Area of catheter insertion was cleaned with chlorhexidine  and draped in sterile fashion.  With real-time ultrasound guidance a central venous catheter was placed into the right internal jugular vein. Nonpulsatile blood flow and easy flushing noted in all ports.  The catheter was sutured in place and sterile dressing applied.  Complications/Tolerance None; patient tolerated the procedure well. Chest X-ray is ordered to verify placement for internal jugular or subclavian cannulation.    EBL Minimal  Specimen(s) None  Warren Shade, DNP, AGACNP-BC [facility]  Please see [email or web address] for pager details.  From 7A-7P if no response, please call [phone]. After hours, please call ELink [phone].

## 2024-06-25 NOTE — H&P (Addendum)
 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 Code stroke called. On arrival to ED patient emergently intubated for acute respiratory distress and airway protection. CT Head with no ICH or large infarct, CTA head and neck with no LVO. CXR concerning for a Pneumonia with visible BL lung apices on CTA demonstrating infection/inflammation. Code stroke cancelled with presentation suspicious for sepsis. Patient received 30ml/kg crystalloid, pan cultured, and started empirically on Cefepime/Vanc/Azithromycin . Of note, family reports patient has recently not felt well and likely has had a cold. PCCM consulted for ICU admission.  On exam in ED, patient febrile to >101 and hypotensive, likely multifactorial in setting of presumed sepsis and recent sedation on induction with underlying heart disease. Norepinephrine gtt was initiated for MAPs <65 and patient was transferred to ICU with plans to gain central access for vasopressor administration and arterial line placement for hemodynamic monitoring.  Family at bedside and indicated Full Code status. All questions were addressed at that time.  Pertinent Medical History:   Past Medical History:  Diagnosis Date   Abnormal finding on EKG 09/19/2013   Acute on chronic respiratory failure with hypercapnia (HCC)    Acute respiratory failure (HCC) 06/08/2016   Anxiety 11/09/2020   Arthritis    Asthma    Carotid bruit 11/09/2020   Chronic diastolic CHF (congestive heart failure) (HCC)  07/28/2017   Chronic obstructive pulmonary disease, unspecified (HCC) 11/09/2020   COPD, group D, by GOLD 2017 classification (HCC) 09/17/2013   Cough 11/09/2020   Decreased estrogen level 11/09/2020   Diabetes (HCC)    Dyspnea    Edema 11/09/2020   Essential hypertension 09/18/2013   Goals of care, counseling/discussion    Hardening of the aorta (main artery of the heart) 11/09/2020   Heart disease    History of radiation therapy    Left Lung- 06/20/23-06/26/23- Dr. 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 rate (!) 26, height 5' 1 (1.549 m), weight 71 kg, SpO2 97%.    Vent Mode: PRVC FiO2 (%):  [70 %-100 %] 70 % Set Rate:  [26 bmp-28 bmp] 28 bmp Vt Set:  [380 mL] 380 mL  PEEP:  [8 cmH20] 8 cmH20 Plateau Pressure:  [29 cmH20-30 cmH20] 30 cmH20   Intake/Output Summary (Last 24 hours) at 06/25/2024 1433 Last data filed at 06/25/2024 1300 Gross per 24 hour  Intake 2229.94 ml  Output --  Net  2229.94 ml   Filed Weights   06/25/24 1040  Weight: 71 kg   Examination: General: Acute on chronically-ill elderly woman lying in bed HENT: [place]/AT, PERRL 2mm bilaterally Lungs: Coarse lung sounds in middle/lower lung fields bilaterally. Ventilator assisted breaths Cardiovascular: NSR-ST, no m/r/g Abdomen: soft, rounded, nonperitonitic Extremities: Warm, dry Neuro: GCS 6T E1V1M4; on Precedex w/ recent sedation following intubation  Resolved Problem List:   Assessment and Plan:   Acute on chronic hypoxic/hypercarbic respiratory failure secondary to presumed pneumonia/sepsis, currently intubated and mechanically ventilation COPD on 5-6L [place] baseline Hx SCC left lower lobe s/p radiation in September 2025 -CTA: emphysema plus patchy and irregular bilateral interstitial thickening and distal airway patchy opacity suggesting superimposed acute pulmonary infection. -Continue ventilator support and wean FiO2 to maintain SpO2 >88% -Continue lung protective ventilation -Initiate bronchopulmonary hygiene therapies: duonebs, Brovana , Pulmicort , Yupelri -Methylprednisolone  40mg  q12 -ABG PRN -Daily CXR while intubated  Sepsis w/ septic shock (presumed pulmonary source) requiring vasopressors to maintain MAP >65 despite fluid resuscitation  Hx HTN Chronic diastolic heart failure -Sepsis protocol-Received 30ml/kg crystalloid; hypotensive despite appropriate fluid resuscitation -Vasopressors initiated: Continue Norepinephrine and Vasopressin to maintain MAP >65 -Will place arterial line for hemodynamic monitoring and central line for vasopressor administration -Hgb stable 10; no signs of active bleeding -Lactic 1.0 on admission -Last TTE 2024: LVEF 50%; LV w/ low normal function, mild LVH, Grade I diastolic dysfunction -No echo indicated at this time, would reconsider if hemodynamics worsen, new arrhythmia develops, or if concerned for sepsis-induced cardiomyopathy. -Monitor cbc daily  Mild  renal impairment in setting of sepsis -sCr 1.12 on admission (baseline 0.8) -Continue to monitor renal function -Place foley catheter -Continue to monitor I&Os  PAD -Precedex while mechanically ventilated -Acetaminophen  1g q8 -Fentanyl  PRN  FEN -LR @75ml /hr -NPO while intubated; NGT; hold TF in setting of high vasopressor requirements to reduce metabolic demand -Replace electrolytes as indicated  Presumed sepsis secondary to pulmonary source -No leukocytosis; WBC 9.5k -Febrile 101.2 on admission; now 103 -CT Chest with patchy opacities concerning for acute infectious process -RPP+Bcx, MRSA pending -Started on Cefepime/Vanc/Azithromycin  for sepsis/CAP coverage -UA negative -Continue to trend WBC and monitor fever curve -Ordered antipyretics   Labs:  CBC: Recent Labs  Lab 06/25/24 0922 06/25/24 0927 06/25/24 1016  WBC 9.5  --   --   NEUTROABS 8.6*  --   --   HGB 10.8* 12.9 10.5*  HCT 38.4 38.0 31.0*  MCV 101.9*  --   --   PLT 295  --   --     Basic Metabolic Panel: Recent Labs  Lab 06/25/24 0922 06/25/24 0927 06/25/24 1016  NA 139 137 139  K 5.4* 5.2* 4.7  CL 93* 94*  --   CO2 36*  --   --   GLUCOSE 181* 184*  --   BUN 16 20  --   CREATININE 1.12* 1.10*  --   CALCIUM  9.1  --   --    GFR: Estimated Creatinine Clearance: 39.8 mL/min (A) (by C-G formula based on SCr of 1.1 mg/dL (H)). Recent Labs  Lab 06/25/24 0922 06/25/24 0927  WBC 9.5  --   LATICACIDVEN  --  1.0    Liver Function Tests: Recent Labs  Lab 06/25/24  0922  AST 24  ALT 11  ALKPHOS 39  BILITOT 0.6  PROT 7.0  ALBUMIN 3.3*   No results for input(s): LIPASE, AMYLASE in the last 168 hours. No results for input(s): AMMONIA in the last 168 hours.  ABG    Component Value Date/Time   PHART 7.250 (L) 06/25/2024 1016   PCO2ART 82.5 (HH) 06/25/2024 1016   PO2ART 269 (H) 06/25/2024 1016   HCO3 36.2 (H) 06/25/2024 1016   TCO2 39 (H) 06/25/2024 1016   O2SAT 100 06/25/2024 1016      Coagulation Profile: Recent Labs  Lab 06/25/24 0922  INR 0.9    Cardiac Enzymes: No results for input(s): CKTOTAL, CKMB, CKMBINDEX, TROPONINI in the last 168 hours.  HbA1C: Hgb A1c MFr Bld  Date/Time Value Ref Range Status  09/17/2013 01:57 PM 6.1 (H) <5.7 % Final    Comment:    (NOTE)                                                                       According to the ADA Clinical Practice Recommendations for 2011, when HbA1c is used as a screening test:  >=6.5%   Diagnostic of Diabetes Mellitus           (if abnormal result is confirmed) 5.7-6.4%   Increased risk of developing Diabetes Mellitus References:Diagnosis and Classification of Diabetes Mellitus,Diabetes Care,2011,34(Suppl 1):S62-S69 and Standards of Medical Care in         Diabetes - 2011,Diabetes Care,2011,34 (Suppl 1):S11-S61.   CBG: Recent Labs  Lab 06/25/24 1355  GLUCAP 143*   Review of Systems:   Unable to complete ROS. Patient intubated and mechanically ventilated.  Past Medical History:  She,  has a past medical history of Abnormal finding on EKG (09/19/2013), Acute on chronic respiratory failure with hypercapnia (HCC), Acute respiratory failure (HCC) (06/08/2016), Anxiety (11/09/2020), Arthritis, Asthma, Carotid bruit (11/09/2020), Chronic diastolic CHF (congestive heart failure) (HCC) (07/28/2017), Chronic obstructive pulmonary disease, unspecified (HCC) (11/09/2020), COPD, group D, by GOLD 2017 classification (HCC) (09/17/2013), Cough (11/09/2020), Decreased estrogen level (11/09/2020), Diabetes (HCC), Dyspnea, Edema (11/09/2020), Essential hypertension (09/18/2013), Goals of care, counseling/discussion, Hardening of the aorta (main artery of the heart) (11/09/2020), Heart disease, History of radiation therapy, Hypertension, Hypertensive heart failure (HCC) (11/09/2020), Hypoxia (11/09/2020), Insomnia (11/09/2020), Iron deficiency anemia (11/09/2020), Large liver (11/09/2020), Near syncope  (09/17/2013), Osteoporosis (11/09/2020), Palliative care encounter, Prediabetes (09/19/2013), Pure hypercholesterolemia (11/09/2020), Raynaud's disease (11/09/2020), Sinus tachycardia (09/18/2013), Skin sensation disturbance (11/09/2020), Smoker (09/17/2013), 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   Surgical History:   Past Surgical History:  Procedure Laterality Date   BRONCHIAL BIOPSY  05/25/2023   Procedure: BRONCHIAL BIOPSIES;  Surgeon: Brenna Adine CROME, DO;  Location: [facility];  Service: Pulmonary;;   BRONCHIAL NEEDLE ASPIRATION BIOPSY  05/25/2023   Procedure: BRONCHIAL NEEDLE ASPIRATION BIOPSIES;  Surgeon: Brenna Adine CROME, DO;  Location: [facility];  Service: Pulmonary;;   FIDUCIAL MARKER PLACEMENT  05/25/2023   Procedure: FIDUCIAL MARKER PLACEMENT;  Surgeon: Brenna Adine CROME, DO;  Location: [facility];  Service: Pulmonary;;   KNEE SURGERY Left      Social History:   reports that she quit smoking about 6 years ago. Her  smoking use included cigarettes. She started smoking about 6 years ago. She has a 55 pack-year smoking history. She has never used smokeless tobacco. She reports that she does not drink alcohol and does not use drugs.   Family History:  Her family history includes Hyperlipidemia in her father and mother; Hypertension in her father and mother.   Allergies Allergies  Allergen Reactions   Ace Inhibitors Swelling   Diphenhydramine Other (See Comments)    Hyperactive/jittery    Doxycycline Hyclate Nausea And Vomiting   Codeine Nausea And Vomiting     Home Medications  Prior to Admission medications   Medication Sig Start Date End Date Taking? Authorizing Provider  acetaminophen  (TYLENOL ) 500 MG tablet Take 1,000 mg by mouth every 6 (six) hours as needed for moderate pain.   Yes [provider]  albuterol  (PROVENTIL  HFA;VENTOLIN  HFA) 108 (90 BASE)  MCG/ACT inhaler Inhale 2 puffs into the lungs every 6 (six) hours as needed for wheezing or shortness of breath. 09/19/13  Yes Johnson, Clanford L, MD  aspirin  81 MG chewable tablet Chew 1 tablet (81 mg total) by mouth daily. 09/19/13  Yes Johnson, Clanford L, MD  atorvastatin  (LIPITOR) 10 MG tablet TAKE 1 TABLET BY MOUTH EVERY DAY 07/21/23  Yes Krasowski, Robert J, MD  benzonatate  (TESSALON ) 100 MG capsule Take 100 mg by mouth 3 (three) times daily as needed for cough.    Yes [provider]  Biotin 10 MG CAPS Take 10 mg by mouth daily.   Yes [provider]  budesonide -formoterol  (SYMBICORT ) 160-4.5 MCG/ACT inhaler Inhale 2 puffs into the lungs 2 (two) times daily. 08/23/19  Yes Mannam, Praveen, MD  calcium  carbonate (OSCAL) 1500 (600 Ca) MG TABS tablet Take 600 mg of elemental calcium  by mouth daily.   Yes [provider]  Cholecalciferol (VITAMIN D ) 50 MCG (2000 UT) tablet Take 2,000 Units by mouth daily.   Yes [provider]  diphenhydramine-acetaminophen  (TYLENOL  PM) 25-500 MG TABS tablet Take 1 tablet by mouth at bedtime.   Yes [provider]  furosemide  (LASIX ) 40 MG tablet Take 40 mg by mouth daily.   Yes [provider]  gabapentin (NEURONTIN) 300 MG capsule Take 900 mg by mouth at bedtime.   Yes [provider]  iron polysaccharides (NIFEREX) 150 MG capsule Take 150 mg by mouth daily. 11/05/20  Yes [provider]  loratadine (CLARITIN) 10 MG tablet Take 10 mg by mouth daily as needed for allergies.   Yes [provider]  LORazepam  (ATIVAN ) 1 MG tablet Take 1 tablet (1 mg total) by mouth every 6 (six) hours as needed for anxiety. 08/07/17  Yes Drusilla Sabas RAMAN, MD  losartan  (COZAAR ) 50 MG tablet Take 50 mg by mouth daily.   Yes [provider]  Multiple Vitamin (MULTIVITAMIN WITH MINERALS) TABS tablet Take 1 tablet by mouth daily.   Yes [provider]  raloxifene  (EVISTA ) 60 MG tablet Take 60 mg  by mouth every morning. 08/07/20  Yes [provider]  Tiotropium Bromide  Monohydrate (SPIRIVA  RESPIMAT) 2.5 MCG/ACT AERS Inhale 1 puff into the lungs daily.   Yes [provider]  traZODone  (DESYREL ) 100 MG tablet Take 1 tablet (100 mg total) by mouth at bedtime as needed for sleep. 08/07/17  Yes Drusilla Sabas RAMAN, MD    Critical care time: 50 minutes   Audryanna Zurita, DNP, AGACNP-BC [facility]  Please see [email or web address] for pager details.  From 7A-7P if no response, please call  [phone]. After hours, please call ELink [phone].

## 2024-06-25 NOTE — Progress Notes (Signed)
 Elink monitoring for sepsis has ended.

## 2024-06-25 NOTE — ED Notes (Signed)
 Patient NOT [facility] going to ICU at [facility]

## 2024-06-25 NOTE — ED Notes (Signed)
 Precedex paused. EDP made aware of bp.

## 2024-06-25 NOTE — ED Notes (Signed)
 EDP notified of patient current bp with maps maintaining in the 70s at this time.

## 2024-06-25 NOTE — ED Triage Notes (Signed)
 PT BIB [facility] from home for code stroke, LKW 2230 last night, family states they heard her yelling at dogs at 0500. Family saw her laying on bed around 0830 this morning, unresponsive. EMS arrived bagging PT, was satting 75% spo2 prior too. Family states PT has been coughing and having cold s/s past week.   EMS VS: HR 130, BP 90/50.

## 2024-06-25 NOTE — ED Provider Notes (Signed)
 [place] EMERGENCY DEPARTMENT AT [facility] Provider Note   CSN: [id number] Arrival date & time: 06/25/24  9083     Patient presents with: No chief complaint on file.   Katie Waller is a 78 y.o. female.   HPI Patient presents in extremis via EMS.  She is subsequently joined by her children. EMS reports the patient was last seen normal at 12 hours ago, then family members found her with reportedly slurred speech, weakness, she was designated as a code stroke and brought expeditiously to hospital.  Family subsequently adds that over the past few days patient has had coughing, congestion.  She has a history of lung cancer, seemingly in remission after radiation therapy last year.  She had been progressively weaker over the past few days.  Level 5 [place] secondary to patient's inability to communicate.    Prior to Admission medications   Medication Sig Start Date End Date Taking? Authorizing Provider  acetaminophen  (TYLENOL ) 500 MG tablet Take 1,000 mg by mouth every 6 (six) hours as needed for moderate pain.    [provider]  albuterol  (PROVENTIL  HFA;VENTOLIN  HFA) 108 (90 BASE) MCG/ACT inhaler Inhale 2 puffs into the lungs every 6 (six) hours as needed for wheezing or shortness of breath. 09/19/13   Vicci Afton CROME, MD  aspirin  81 MG chewable tablet Chew 1 tablet (81 mg total) by mouth daily. 09/19/13   Johnson, Clanford L, MD  atorvastatin  (LIPITOR) 10 MG tablet TAKE 1 TABLET BY MOUTH EVERY DAY 07/21/23   Krasowski, Zyann Mabry J, MD  benzonatate  (TESSALON ) 100 MG capsule Take 100 mg by mouth 3 (three) times daily as needed for cough.     [provider]  Biotin 10 MG CAPS Take 10 mg by mouth daily.    [provider]  budesonide -formoterol  (SYMBICORT ) 160-4.5 MCG/ACT inhaler Inhale 2 puffs into the lungs 2 (two) times daily. 08/23/19   Mannam, Praveen, MD  calcium  carbonate (OSCAL) 1500 (600 Ca) MG TABS tablet Take 600 mg of elemental calcium  by  mouth daily.    [provider]  Cholecalciferol (VITAMIN D ) 50 MCG (2000 UT) tablet Take 2,000 Units by mouth daily.    [provider]  diclofenac  Sodium (VOLTAREN ) 1 % GEL Apply 4 g topically 4 (four) times daily. Patient not taking: Reported on 06/11/2024 03/26/24   Emil Share, DO  furosemide  (LASIX ) 40 MG tablet Take 40 mg by mouth daily.    [provider]  gabapentin (NEURONTIN) 300 MG capsule Take 900 mg by mouth at bedtime.    [provider]  iron polysaccharides (NIFEREX) 150 MG capsule Take 150 mg by mouth daily. 11/05/20   [provider]  LORazepam  (ATIVAN ) 1 MG tablet Take 1 tablet (1 mg total) by mouth every 6 (six) hours as needed for anxiety. 08/07/17   Drusilla Sabas RAMAN, MD  losartan  (COZAAR ) 50 MG tablet Take 50 mg by mouth daily.    [provider]  morphine  (MSIR) 15 MG tablet Take 0.5 tablets (7.5 mg total) by mouth every 4 (four) hours as needed for severe pain (pain score 7-10). Patient not taking: Reported on 06/11/2024 03/26/24   Emil Share, DO  Multiple Vitamin (MULTIVITAMIN WITH MINERALS) TABS tablet Take 1 tablet by mouth daily.    [provider]  raloxifene  (EVISTA ) 60 MG tablet Take 60 mg by mouth every morning. 08/07/20   [provider]  Tiotropium Bromide  Monohydrate (SPIRIVA  RESPIMAT) 2.5 MCG/ACT AERS Inhale 1 puff into the  lungs daily.    [provider]  traZODone  (DESYREL ) 100 MG tablet Take 1 tablet (100 mg total) by mouth at bedtime as needed for sleep. 08/07/17   Drusilla Sabas RAMAN, MD    Allergies: Ace inhibitors, Diphenhydramine, Doxycycline hyclate, and Codeine    Review of Systems  Updated Vital Signs There were no vitals taken for this visit.  Physical Exam Vitals and nursing note reviewed.  Constitutional:      General: She is not in acute distress.    Appearance: She is well-developed. She is ill-appearing.     Comments: Unresponsive adult female with minimal spontaneous  respiratory activity, no spontaneous neurologic activity.   HENT:     Head: Normocephalic and atraumatic.  Eyes:     Conjunctiva/sclera: Conjunctivae normal.  Cardiovascular:     Rate and Rhythm: Regular rhythm. Tachycardia present.  Abdominal:     General: There is no distension.  Skin:    General: Skin is warm and dry.  Neurological:     Comments: Areflexic, unresponsive     (all labs ordered are listed, but only abnormal results are displayed) Labs Reviewed  I-STAT CHEM 8, ED - Abnormal; Notable for the following components:      Result Value   Potassium 5.2 (*)    Chloride 94 (*)    Creatinine, Ser 1.10 (*)    Glucose, Bld 184 (*)    Calcium , Ion 1.10 (*)    TCO2 37 (*)    All other components within normal limits  RESP PANEL BY RT-PCR (RSV, FLU A&B, COVID)  RVPGX2  CULTURE, BLOOD (ROUTINE X 2)  CULTURE, BLOOD (ROUTINE X 2)  PROTIME-INR  APTT  CBC  DIFFERENTIAL  COMPREHENSIVE METABOLIC PANEL WITH GFR  ETHANOL  BRAIN NATRIURETIC PEPTIDE  URINALYSIS, W/ REFLEX TO CULTURE (INFECTION SUSPECTED)  I-STAT CG4 LACTIC ACID, ED  CBG MONITORING, ED  I-STAT CG4 LACTIC ACID, ED    EKG: None  Radiology: No results found.   Procedures   Medications Ordered in the ED  sodium chloride  flush (NS) 0.9 % injection 3 mL (has no administration in time range)  lactated ringers  infusion (has no administration in time range)  lactated ringers  bolus 1,000 mL (has no administration in time range)    And  lactated ringers  bolus 1,000 mL (has no administration in time range)    And  lactated ringers  bolus 250 mL (has no administration in time range)  ceFEPIme (MAXIPIME) 2 g in sodium chloride  0.9 % 100 mL IVPB (has no administration in time range)  metroNIDAZOLE (FLAGYL) IVPB 500 mg (has no administration in time range)  vancomycin (VANCOCIN) IVPB 1000 mg/200 mL premix (has no administration in time range)                                    Medical Decision Making Adult  female arrives in extremis receiving ventilated support via bag-valve-mask assistance in the context of being found with weakness, slurred speech and a recent URI-like illness.  Broad differential including stroke versus sepsis versus pneumonia. Patient required emergent intubation, for airway protection, to facilitate care.  This was performed first-pass, no complications, Case comanaged initially with neurology, the code stroke was eventually canceled after CT head was unremarkable.  Patient received broad-spectrum antibiotics, fluid resuscitation after arrival due to concern for sepsis. Cardiac 130 sinus tach abnormal Pulse ox 99% with intubation abnormal   Amount and/or Complexity  of Data Reviewed Independent Historian: EMS    Details: Caregivers at bedside External Data Reviewed: notes. Labs: ordered. Decision-making details documented in ED Course. Radiology: ordered and independent interpretation performed. Decision-making details documented in ED Course. ECG/medicine tests: ordered and independent interpretation performed. Decision-making details documented in ED Course.  Risk Prescription drug management. Decision regarding hospitalization. Diagnosis or treatment significantly limited by social determinants of health.  INTUBATION Performed by: Lamar Salen  Required items: required blood products, implants, devices, and special equipment available Patient identity confirmed: provided demographic data and hospital-assigned identification number Time out: Immediately prior to procedure a time out was called to verify the correct patient, procedure, equipment, support staff and site/side marked as required.  Indications: Airway protection  Intubation method: Glidescope Laryngoscopy   Preoxygenation: BVM  Sedatives: 20 etomidate Paralytic: 100 rocuronium   Tube Size: 7.5 cuffed  Post-procedure assessment: chest rise and ETCO2 monitor Breath sounds: equal and absent over  the epigastrium Tube secured with: ETT holder Chest x-ray interpreted by radiologist and me.  Chest x-ray findings: endotracheal tube in appropriate position  Patient tolerated the procedure well with no immediate complications.   Following intubation patient had drop in blood pressure requiring ongoing fluid resuscitation, subsequently MAP improved to greater than 70.  With fever, respiratory collapse, patient received broad-spectrum antibiotics as above, and sedation with fentanyl , Versed. 10:53 AM Patient with ongoing tachypnea, breath stacking, patient will start Precedex.  I discussed patient's case with our critical care colleagues.  In essence, patient presenting with signs and symptoms of sepsis, respiratory collapse, requiring intubation, stabilization with fluid resuscitation, broad-spectrum antibiotics, intubation and transfer to our ICU.   CRITICAL CARE Performed by: Lamar Salen Total critical care time: 45 minutes Critical care time was exclusive of separately billable procedures and treating other patients. Critical care was necessary to treat or prevent imminent or life-threatening deterioration. Critical care was time spent personally by me on the following activities: development of treatment plan with patient and/or surrogate as well as nursing, discussions with consultants, evaluation of patient's response to treatment, examination of patient, obtaining history from patient or surrogate, ordering and performing treatments and interventions, ordering and review of laboratory studies, ordering and review of radiographic studies, pulse oximetry and re-evaluation of patient's condition.   Final diagnoses:  Septic shock [facility])     Salen Lamar, MD 06/25/24 1054

## 2024-06-25 NOTE — ED Notes (Signed)
 16M nurse contacted to clarify any remaining questions by Lonni, RN.

## 2024-06-25 NOTE — Consult Note (Signed)
 NEUROLOGY CONSULT NOTE   Date of service: June 25, 2024 Patient Name: Katie Waller MRN:  [id number] DOB:  04-Aug-1946 Chief Complaint: CODE STROKE Requesting Provider: Theodoro Lakes, MD  History of Present Illness  Katie Waller is a 78 y.o. female with hx of HTN, DM, CHF, COPD, lung cancer who was brought in by EMS as a code stroke due to acute onset of unresponsiveness.  Per EMS, en route patient became hypoxic with increasingly shallow respirations.  On presentation to ED, patient was receiving bag valve assisted breaths.  She was taken to room 34 and emergently intubated due to acute respiratory distress and failure to protect airway.  On assessment at this time, patient had pinpoint pupils, no response to noxious stimuli.  Patient was giving 1 of Narcan with no improvement.  After intubation and stabilization, patient was taken to CT. CTH and CTA negative.  LKW: 2200 10/20 Modified rankin score: 2-Slight disability-UNABLE to perform all activities but does not need assistance IV Thrombolysis: No, no focal deficits EVT: No LVO  NIHSS components Score: Comment  1a Level of Conscious 0[]  1[]  2[]  3[x]      1b LOC Questions 0[]  1[]  2[x]       1c LOC Commands 0[]  1[]  2[x]       2 Best Gaze 0[x]  1[]  2[]       3 Visual 0[]  1[]  2[]  3[x]      4 Facial Palsy 0[x]  1[]  2[]  3[]      5a Motor Arm - left 0[]  1[]  2[]  3[]  4[]  UN[x]    5b Motor Arm - Right 0[]  1[]  2[]  3[]  4[]  UN[x]    6a Motor Leg - Left 0[]  1[]  2[]  3[]  4[]  UN[x]    6b Motor Leg - Right 0[]  1[]  2[]  3[]  4[]  UN[x]    7 Limb Ataxia 0[]  1[]  2[]  UN[x]      8 Sensory 0[]  1[]  2[]  UN[x]      9 Best Language 0[]  1[]  2[]  3[x]      10 Dysarthria 0[]  1[]  2[]  UN[x]      11 Extinct. and Inattention 0[]  1[]  2[]       TOTAL:       ROS   Unable to ascertain due to AMS  Past History   Past Medical History:  Diagnosis Date   Abnormal finding on EKG 09/19/2013   Acute on chronic respiratory failure with hypercapnia (HCC)    Acute respiratory  failure (HCC) 06/08/2016   Anxiety 11/09/2020   Arthritis    Asthma    Carotid bruit 11/09/2020   Chronic diastolic CHF (congestive heart failure) (HCC) 07/28/2017   Chronic obstructive pulmonary disease, unspecified (HCC) 11/09/2020   COPD, group D, by GOLD 2017 classification (HCC) 09/17/2013   Cough 11/09/2020   Decreased estrogen level 11/09/2020   Diabetes (HCC)    Dyspnea    Edema 11/09/2020   Essential hypertension 09/18/2013   Goals of care, counseling/discussion    Hardening of the aorta (main artery of the heart) 11/09/2020   Heart disease    History of radiation therapy    Left Lung- 06/20/23-06/26/23- Dr. 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   Transient ischemic attack 11/09/2020   Venous stasis of both lower extremities 06/13/2016    Past Surgical History:  Procedure Laterality Date   BRONCHIAL BIOPSY  05/25/2023   Procedure: BRONCHIAL BIOPSIES;  Surgeon: Brenna Adine CROME, DO;  Location: [facility];  Service: Pulmonary;;   BRONCHIAL NEEDLE ASPIRATION BIOPSY  05/25/2023   Procedure: BRONCHIAL NEEDLE ASPIRATION BIOPSIES;  Surgeon: Brenna Adine CROME, DO;  Location: [facility];  Service: Pulmonary;;   FIDUCIAL MARKER PLACEMENT  05/25/2023   Procedure: FIDUCIAL MARKER PLACEMENT;  Surgeon: Brenna Adine CROME, DO;  Location: [facility];  Service: Pulmonary;;   KNEE SURGERY Left     Family History: Family History  Problem Relation Age of Onset   Hyperlipidemia Mother    Hypertension Mother    Hypertension Father     Hyperlipidemia Father     Social History  reports that she quit smoking about 6 years ago. Her smoking use included cigarettes. She started smoking about 6 years ago. She has a 55 pack-year smoking history. She has never used smokeless tobacco. She reports that she does not drink alcohol and does not use drugs.  Allergies  Allergen Reactions   Ace Inhibitors Swelling   Diphenhydramine Other (See Comments)    Hyperactive/jittery    Doxycycline Hyclate Nausea And Vomiting   Codeine Nausea And Vomiting    Medications   Current Facility-Administered Medications:    acetaminophen  (TYLENOL ) suppository 650 mg, 650 mg, Rectal, Once, Garrick Charleston, MD   Chlorhexidine  Gluconate Cloth 2 % PADS 6 each, 6 each, Topical, Daily, Dagenhart, Jamie H, NP   dexmedetomidine (PRECEDEX) 400 MCG/100ML (4 mcg/mL) infusion, 0-1.2 mcg/kg/hr, Intravenous, Titrated, Garrick Charleston, MD, Last Rate: 7.1 mL/hr at 06/25/24 1108, 0.4 mcg/kg/hr at 06/25/24 1108   docusate sodium  (COLACE) capsule 100 mg, 100 mg, Oral, BID PRN, Dagenhart, Jamie H, NP   etomidate (AMIDATE) injection, , Intravenous, Code/Trauma/Sedation Med, Garrick Charleston, MD, 20 mg at 06/25/24 9076   fentaNYL  (SUBLIMAZE ) injection 50 mcg, 50 mcg, Intravenous, Q15 min PRN, Garrick Charleston, MD   fentaNYL  (SUBLIMAZE ) injection 50-200 mcg, 50-200 mcg, Intravenous, Q30 min PRN, Garrick Charleston, MD   heparin injection 5,000 Units, 5,000 Units, Subcutaneous, Q8H, Dagenhart, Jamie H, NP   [COMPLETED] lactated ringers  bolus 1,000 mL, 1,000 mL, Intravenous, Once, Stopped at 06/25/24 1003 **AND** [COMPLETED] lactated ringers  bolus 1,000 mL, 1,000 mL, Intravenous, Once, Stopped at 06/25/24 1108 **AND** lactated ringers  bolus 250 mL, 250 mL, Intravenous, Once, Garrick Charleston, MD   lactated ringers  infusion, , Intravenous, Continuous, Garrick Charleston, MD, Last Rate: 150 mL/hr at 06/25/24 1011, New Bag at 06/25/24 1011   metroNIDAZOLE (FLAGYL) IVPB 500 mg,  500 mg, Intravenous, Once, Garrick Charleston, MD, Last Rate: 100 mL/hr at 06/25/24 1057, 500 mg at 06/25/24 1057   midazolam PF (VERSED) injection 1-2 mg, 1-2 mg, Intravenous, Q1H PRN, Garrick Charleston, MD, 2 mg at 06/25/24 1008   norepinephrine (LEVOPHED) 4mg  in (0.016 mg/mL) premix infusion, 0-40 mcg/min, Intravenous, Continuous, Garrick Charleston, MD, Last Rate: 56.3 mL/hr at 06/25/24 1139, 15 mcg/min at 06/25/24 1139   ondansetron  (ZOFRAN ) injection 4 mg, 4 mg, Intravenous, Q6H PRN, Dagenhart, Jamie H, NP   polyethylene glycol (MIRALAX  / GLYCOLAX ) packet 17 g, 17 g, Oral, Daily PRN, Dagenhart, Jamie H, NP   rocuronium  (ZEMURON ) injection, , Intravenous, Code/Trauma/Sedation Med, Garrick Charleston, MD, 100 mg at 06/25/24 0924   vancomycin (VANCOREADY) IVPB 1250 mg/250 mL, 1,250 mg, Intravenous, Once, Tanda Powell ORN, [facility]  vasopressin 20 units/100 mL infusion SOLN, , , ,   Current Outpatient Medications:    acetaminophen  (TYLENOL ) 500 MG tablet, Take 1,000 mg by mouth every 6 (six) hours as needed for moderate pain., Disp: , Rfl:    albuterol  (PROVENTIL  HFA;VENTOLIN  HFA) 108 (90 BASE) MCG/ACT inhaler, Inhale 2 puffs into the lungs every 6 (six) hours as needed for wheezing or shortness of breath., Disp: 1 Inhaler, Rfl: 0   aspirin  81 MG chewable tablet, Chew 1 tablet (81 mg total) by mouth daily., Disp: , Rfl:    atorvastatin  (LIPITOR) 10 MG tablet, TAKE 1 TABLET BY MOUTH EVERY DAY, Disp: 90 tablet, Rfl: 1   benzonatate  (TESSALON ) 100 MG capsule, Take 100 mg by mouth 3 (three) times daily as needed for cough. , Disp: , Rfl:    Biotin 10 MG CAPS, Take 10 mg by mouth daily., Disp: , Rfl:    budesonide -formoterol  (SYMBICORT ) 160-4.5 MCG/ACT inhaler, Inhale 2 puffs into the lungs 2 (two) times daily., Disp: 1 Inhaler, Rfl: 4   calcium  carbonate (OSCAL) 1500 (600 Ca) MG TABS tablet, Take 600 mg of elemental calcium  by mouth daily., Disp: , Rfl:    Cholecalciferol (VITAMIN D ) 50 MCG (2000 UT)  tablet, Take 2,000 Units by mouth daily., Disp: , Rfl:    diclofenac  Sodium (VOLTAREN ) 1 % GEL, Apply 4 g topically 4 (four) times daily. (Patient not taking: Reported on 06/11/2024), Disp: 100 g, Rfl: 0   furosemide  (LASIX ) 40 MG tablet, Take 40 mg by mouth daily., Disp: , Rfl:    gabapentin (NEURONTIN) 300 MG capsule, Take 900 mg by mouth at bedtime., Disp: , Rfl:    iron polysaccharides (NIFEREX) 150 MG capsule, Take 150 mg by mouth daily., Disp: , Rfl:    LORazepam  (ATIVAN ) 1 MG tablet, Take 1 tablet (1 mg total) by mouth every 6 (six) hours as needed for anxiety., Disp: 30 tablet, Rfl: 0   losartan  (COZAAR ) 50 MG tablet, Take 50 mg by mouth daily., Disp: , Rfl:    morphine  (MSIR) 15 MG tablet, Take 0.5 tablets (7.5 mg total) by mouth every 4 (four) hours as needed for severe pain (pain score 7-10). (Patient not taking: Reported on 06/11/2024), Disp: 7 tablet, Rfl: 0   Multiple Vitamin (MULTIVITAMIN WITH MINERALS) TABS tablet, Take 1 tablet by mouth daily., Disp: , Rfl:    raloxifene  (EVISTA ) 60 MG tablet, Take 60 mg by mouth every morning., Disp: , Rfl:    Tiotropium Bromide  Monohydrate (SPIRIVA  RESPIMAT) 2.5 MCG/ACT AERS, Inhale 1 puff into the lungs daily., Disp: , Rfl:    traZODone  (DESYREL ) 100 MG tablet, Take 1 tablet (100 mg total) by mouth at bedtime as needed for sleep., Disp: 10 tablet, Rfl: 0  Vitals   Vitals:   06/25/24 1134 06/25/24 1136 06/25/24 1138 06/25/24 1140  BP: (!) 53/33 (!) 63/46 (!) 53/34 (!) 62/48  Pulse:      Resp:      Temp:      TempSrc:      SpO2:      Weight:      Height:        Body mass index is 29.58 kg/m.   Physical Exam   Constitutional: Appears acutely ill Cardiovascular: Tachycardic on monitor.  Respiratory: bag valve respirations on presentation  Neurologic Examination   General: Laying comfortably in bed; obtunded/unresponsive. HENT: Normal oropharynx and mucosa. Normal external appearance of ears and nose. Neck: Supple, no pain or  tenderness  CV: No JVD. No  peripheral edema.  Pulmonary: Symmetric Chest rise.bag valve ventilation,not spontaneously breathing Abdomen: Soft to touch, non-tender. Ext: No cyanosis, edema, or deformity  Skin: No rash. Normal palpation of skin.   Musculoskeletal: Normal digits and nails by inspection. No clubbing.   Neurologic Examination  Mental status/Cognition: obtunded and unresponsive to pain or tactile stim Speech/language: mute, no speech Cranial nerves:   CN II Pupils pinpoint and sluggish reaction to bright light.   CN III,IV,VI Unable to evaluate EOMI. No gaze preference or deviation.   CN V Corneals absent BL   CN VII Facial diplegia   CN VIII Does not open eyes or turn head towards speech   CN IX & X Not coughing on BVM   CN XI Unable to assess.   CN XII Unable to protrude tongue on command.   Sensory/Motor:  Muscle bulk: poor, tone flaccid in all extremites  No spontaneous movements noted in any of the extremities. Does not move any of her extremities to proximal pinch.  Coordination/Complex Motor:  - Unable to assess.  Labs/Imaging/Neurodiagnostic studies   CBC:  Recent Labs  Lab 2024/06/28 0922 June 28, 2024 0927 2024/06/28 1016  WBC 9.5  --   --   NEUTROABS 8.6*  --   --   HGB 10.8* 12.9 10.5*  HCT 38.4 38.0 31.0*  MCV 101.9*  --   --   PLT 295  --   --    Basic Metabolic Panel:  Lab Results  Component Value Date   NA 139 Jun 28, 2024   K 4.7 2024-06-28   CO2 36 (H) 28-Jun-2024   GLUCOSE 184 (H) Jun 28, 2024   BUN 20 2024/06/28   CREATININE 1.10 (H) 06/28/2024   CALCIUM  9.1 06/28/24   GFRNONAA 51 (L) 06/28/2024   GFRAA 44 (L) 12/24/2018   Lipid Panel:  Lab Results  Component Value Date   LDLCALC 66 09/18/2013   HgbA1c:  Lab Results  Component Value Date   HGBA1C 6.1 (H) 09/17/2013   Urine Drug Screen: No results found for: LABOPIA, COCAINSCRNUR, LABBENZ, AMPHETMU, THCU, LABBARB  Alcohol Level     Component Value Date/Time   ETH <15  2024/06/28 0922   INR  Lab Results  Component Value Date   INR 0.9 06/28/2024   APTT  Lab Results  Component Value Date   APTT 23 (L) 2024/06/28   AED levels: No results found for: PHENYTOIN, ZONISAMIDE, LAMOTRIGINE, LEVETIRACETA  CT Head without contrast(Personally reviewed): CTH was negative for a large hypodensity concerning for a large territory infarct or hyperdensity concerning for an ICH  CT angio Head and Neck with contrast(Personally reviewed): No LVO   ASSESSMENT   Katie Waller is a 78 y.o. female with hx of HTN, DM, CHF, COPD, lung cancer who was brought in by EMS as a code stroke due to acute onset of unresponsiveness. She is being bagged on arrival. Noted febrile, hypotensive in the ED which worsened after intubation.  She is not hypoglycemic, no response to narcan, CT Head with no ICH or large infarct, CTA head and neck with no LVO. No clinical seizure activity noted. ABG with hypercarbia but this may be chronic given her known hx of COPD.  CXR concerning for a Pneumonia with visible BL lung apices on CTA demonstrating infection/inflammation.  RECOMMENDATIONS  - low overall suspicion for stroke. Suspect likely septic. If she is noted to have deficits once extubated, recommend obtaining an MRI brain at that time. - routine EEG. ______________________________________________________________________  Plan discussed with Dr. Garrick with  the ED team. Code stroke cancelled.   Signed, Katie Katie Likes, NP [facility]

## 2024-06-25 NOTE — Progress Notes (Signed)
 Pt transported to CT and back to room 34 without incident.

## 2024-06-25 NOTE — TOC CM/SW Note (Signed)
 Transition of Care [facility]) - Inpatient Brief Assessment   Patient Details  Name: Katie Waller MRN: [id number] Date of Birth: 02/12/1946  Transition of Care [facility] CM/SW Contact:    Lauraine FORBES Saa, LCSWA Phone Number: 06/25/2024, 4:45 PM   Clinical Narrative:  4:45 PM Per chart review, patient resides at home. Patient has a PCP and insurance. Patient has SNF history with [facility]. Patient has HH history with [facility]. Patient has DME (POC/concentrator) history with [facility] and [facility]. Patient's preferred pharmacy is [facility] [address]. Patient does not have SDOH needs as of responses from September 30th, 2024. Patient is currently intubated with NG/OG and unable to answer SDOH questions. No TOC needs identified at this time. TOC will continue to follow.  Transition of Care Asessment: Insurance and Status: Insurance coverage has been reviewed Patient has primary care physician: Yes Home environment has been reviewed: Private Residence Prior level of function:: N/A Prior/Current Home Services: No current home services Social Drivers of Health Review: SDOH reviewed no interventions necessary (As of September 30th, 2024. Patinet is currently intubated.) Readmission risk has been reviewed: Yes (Currently Yellow 21%) Transition of care needs: no transition of care needs at this time

## 2024-06-25 NOTE — ED Notes (Signed)
 Garrick, MD and critical care NP at bedside and notified/made aware of patient blood pressures being 50s systolic and maps <50.

## 2024-06-25 NOTE — Progress Notes (Signed)
 Pt transported to 2M14 from ED room 34 without incident.

## 2024-06-25 NOTE — Progress Notes (Signed)
 Pharmacy Antibiotic Note  Katie Waller is a 78 y.o. female admitted on 06/25/2024 with pneumonia and sepsis.  Pharmacy has been consulted for cefepime dosing.  Plan: Cefepime 2 g IV q 12h  -f/u renal func, imaging, LOT, cultures  Height: 5' 1 (154.9 cm) Weight: 71 kg (156 lb 8.4 oz) IBW/kg (Calculated) : 47.8  Temp (24hrs), Avg:100.5 F (38.1 C), Min:99.4 F (37.4 C), Max:101.2 F (38.4 C)  Recent Labs  Lab 06/25/24 0922 06/25/24 0927  WBC 9.5  --   CREATININE 1.12* 1.10*  LATICACIDVEN  --  1.0    Estimated Creatinine Clearance: 39.8 mL/min (A) (by C-G formula based on SCr of 1.1 mg/dL (H)).    Allergies  Allergen Reactions   Ace Inhibitors Swelling   Diphenhydramine Other (See Comments)    Hyperactive/jittery    Doxycycline Hyclate Nausea And Vomiting   Codeine Nausea And Vomiting    Antimicrobials this admission: Cefepime 10/21> Azithro 10/21> Flagyl 10/21 x1 Vanc 10/21 x1  Dose adjustments this admission:   Microbiology results: 10/21 [facility]  Thank you for allowing pharmacy to be a part of this patient's care.  Sharyne Glatter, PharmD, BCCCP Critical Care Clinical Pharmacist 06/25/2024 12:59 PM

## 2024-06-25 NOTE — Code Documentation (Signed)
 Stroke Response Nurse Documentation Code Documentation  Katie Waller is a 78 y.o. female arriving to [facility]  via [facility] on 10/21 with past medical hx of HTN, DM, CHF, COPD, lung cancer. On No antithrombotic. Code stroke was activated by EMS.   Patient from home where she was LKW at 2130 and now complaining of being unresponsive. Family heard her call out to the dogs with slurred speech this morning, but otherwise had last seen her at 2130 yesterday. Found to be slumped over and unresponsive and EMS was called.    Stroke team at the bedside on patient arrival. Taken to room 34 for airway protection as patient arrived having ventilations assisted by BVM. Patient to CT with team. NIHSS 38, see documentation for details and code stroke times. The following imaging was completed:  CT Head and CTA. Patient is not a candidate for IV Thrombolytic due to LKW 2130 yesterday. Patient is not a candidate for IR due to no LVO.   Care Plan: cancel code stroke.   Bedside handoff with ED RN Topher.    Lauraine CROME Searle  Stroke Response RN

## 2024-06-25 NOTE — Procedures (Signed)
 Arterial Catheter Insertion Procedure Note  Katie Waller  [id number]  Oct 11, 1945  Date:06/25/24  Time:4:49 PM    Provider Performing: Warren DEL Chikita Dogan    Procedure: Insertion of Arterial Line (63379) with US  guidance (23062)   Indication(s) Blood pressure monitoring and/or need for frequent ABGs  Consent Risks of the procedure as well as the alternatives and risks of each were explained to the patient and/or caregiver.  Consent for the procedure was obtained and is signed in the bedside chart  Anesthesia Local anesthetic: 3ml 1% Lidocaine    Time Out Verified patient identification, verified procedure, site/side was marked, verified correct patient position, special equipment/implants available, medications/allergies/relevant history reviewed, required imaging and test results available.   Sterile Technique Maximal sterile technique including full sterile barrier drape, hand hygiene, sterile gown, sterile gloves, mask, hair covering, sterile ultrasound probe cover (if used).   Procedure Description Area of catheter insertion was cleaned with chlorhexidine  and draped in sterile fashion. With real-time ultrasound guidance an arterial catheter was placed into the right femoral artery.  Appropriate arterial tracings confirmed on monitor.     Complications/Tolerance None; patient tolerated the procedure well.   EBL Minimal   Specimen(s) None  Warren Shade, DNP, AGACNP-BC [facility]  Please see [email or web address] for pager details.  From 7A-7P if no response, please call [phone]. After hours, please call ELink [phone].

## 2024-06-25 NOTE — ED Notes (Signed)
 [facility] contacted to transfer the patient to [facility].

## 2024-06-25 NOTE — Progress Notes (Signed)
   06/25/24 0940  Airway 7.5 mm  Placement Date/Time: 06/25/24 0940   Grade View: Grade 1  Placed By: ED Physician  Airway Device: Endotracheal Tube  Laryngoscope Blade: 4  ETT Types: Oral  Size (mm): 7.5 mm  Cuffed: Cuffed  Insertion attempts: 1  Airway Equipment: Stylet;Video Laryn...  Secured at (cm) 24 cm  Measured From Lips  Secured Location Center  Secured By English as a second language teacher No  Cuff Pressure (cm H2O) Green OR 18-26 CmH2O  Site Condition Dry  Adult Ventilator Settings  Vent Type Servo i  Humidity HME  Vent Mode PRVC  Vt Set 380 mL  Set Rate 26 bmp  FiO2 (%) 100 %  I Time 0.8 Sec(s)  PEEP 8 cmH20

## 2024-06-25 NOTE — Progress Notes (Signed)
 Elink monitoring for the code sepsis protocol.

## 2024-06-25 NOTE — Plan of Care (Signed)
  Problem: Health Behavior/Discharge Planning: Goal: Ability to manage health-related needs will improve Outcome: Progressing   Problem: Clinical Measurements: Goal: Will remain free from infection Outcome: Progressing   Problem: Pain Managment: Goal: General experience of comfort will improve and/or be controlled Outcome: Progressing   Problem: Skin Integrity: Goal: Risk for impaired skin integrity will decrease Outcome: Progressing   Problem: Tissue Perfusion: Goal: Adequacy of tissue perfusion will improve Outcome: Progressing   Problem: Respiratory: Goal: Ability to maintain adequate ventilation will improve Outcome: Progressing

## 2024-06-26 ENCOUNTER — Inpatient Hospital Stay (HOSPITAL_COMMUNITY)

## 2024-06-26 DIAGNOSIS — A419 Sepsis, unspecified organism: Secondary | ICD-10-CM

## 2024-06-26 DIAGNOSIS — J441 Chronic obstructive pulmonary disease with (acute) exacerbation: Secondary | ICD-10-CM

## 2024-06-26 DIAGNOSIS — R569 Unspecified convulsions: Secondary | ICD-10-CM

## 2024-06-26 DIAGNOSIS — J9621 Acute and chronic respiratory failure with hypoxia: Secondary | ICD-10-CM | POA: Diagnosis not present

## 2024-06-26 DIAGNOSIS — R6521 Severe sepsis with septic shock: Secondary | ICD-10-CM

## 2024-06-26 DIAGNOSIS — B9781 Human metapneumovirus as the cause of diseases classified elsewhere: Secondary | ICD-10-CM

## 2024-06-26 DIAGNOSIS — J9622 Acute and chronic respiratory failure with hypercapnia: Secondary | ICD-10-CM | POA: Diagnosis not present

## 2024-06-26 LAB — RESPIRATORY PANEL BY PCR

## 2024-06-26 LAB — PHOSPHORUS: Phosphorus: 2.8 mg/dL (ref 2.5–4.6)

## 2024-06-26 LAB — BASIC METABOLIC PANEL WITH GFR
Anion gap: 6 (ref 5–15)
BUN: 23 mg/dL (ref 8–23)
CO2: 27 mmol/L (ref 22–32)
Calcium: 8.1 mg/dL — ABNORMAL LOW (ref 8.9–10.3)
Chloride: 102 mmol/L (ref 98–111)
Creatinine, Ser: 0.98 mg/dL (ref 0.44–1.00)
GFR, Estimated: >60 mL/min (ref >60)
Glucose, Bld: 175 mg/dL — ABNORMAL HIGH (ref 70–99)
Potassium: 4.7 mmol/L (ref 3.5–5.1)
Sodium: 135 mmol/L (ref 135–145)

## 2024-06-26 LAB — HEMOGLOBIN A1C
Hgb A1c MFr Bld: 5.6 % (ref 4.8–5.6)
Mean Plasma Glucose: 114.02 mg/dL

## 2024-06-26 LAB — BLOOD CULTURE ID PANEL (REFLEXED) - BCID2

## 2024-06-26 LAB — LEGIONELLA PNEUMOPHILA SEROGP 1 UR AG: L. pneumophila Serogp 1 Ur Ag: NEGATIVE

## 2024-06-26 LAB — CBC
HCT: 30.4 % — ABNORMAL LOW (ref 36.0–46.0)
Hemoglobin: 9.2 g/dL — ABNORMAL LOW (ref 12.0–15.0)
MCH: 29 pg (ref 26.0–34.0)
MCHC: 30.3 g/dL (ref 30.0–36.0)
MCV: 95.9 fL (ref 80.0–100.0)
Platelets: 202 K/uL (ref 150–400)
RBC: 3.17 MIL/uL — ABNORMAL LOW (ref 3.87–5.11)
RDW: 12.3 % (ref 11.5–15.5)
WBC: 8 K/uL (ref 4.0–10.5)
nRBC: 0 % (ref 0.0–0.2)

## 2024-06-26 LAB — GLUCOSE, CAPILLARY
Glucose-Capillary: 190 mg/dL — ABNORMAL HIGH (ref 70–99)
Glucose-Capillary: 157 mg/dL — ABNORMAL HIGH (ref 70–99)
Glucose-Capillary: 92 mg/dL (ref 70–99)
Glucose-Capillary: 119 mg/dL — ABNORMAL HIGH (ref 70–99)
Glucose-Capillary: 144 mg/dL — ABNORMAL HIGH (ref 70–99)
Glucose-Capillary: 146 mg/dL — ABNORMAL HIGH (ref 70–99)

## 2024-06-26 LAB — BLOOD GAS, ARTERIAL
Acid-Base Excess: 4.8 mmol/L — ABNORMAL HIGH (ref 0.0–2.0)
Bicarbonate: 30.4 mmol/L — ABNORMAL HIGH (ref 20.0–28.0)
O2 Saturation: 98.3 %
Patient temperature: 37.2
pCO2 arterial: 49 mmHg — ABNORMAL HIGH (ref 32–48)
pH, Arterial: 7.4 (ref 7.35–7.45)
pO2, Arterial: 184 mmHg — ABNORMAL HIGH (ref 83–108)

## 2024-06-26 LAB — PROCALCITONIN: Procalcitonin: 2.69 ng/mL

## 2024-06-26 LAB — MAGNESIUM: Magnesium: 2.1 mg/dL (ref 1.7–2.4)

## 2024-06-26 MED ORDER — SENNA 8.6 MG PO TABS
1.0000 | ORAL_TABLET | Freq: Every day | ORAL | Status: DC
  Administered 2024-06-27 – 2024-07-02 (×4): 8.6 mg
  Filled 2024-06-26 (×4): qty 1

## 2024-06-26 MED ORDER — POLYETHYLENE GLYCOL 3350 17 G PO PACK
17.0000 g | PACK | Freq: Every day | ORAL | Status: DC
  Administered 2024-06-27 – 2024-07-02 (×4): 17 g
  Filled 2024-06-26 (×4): qty 1

## 2024-06-26 MED ORDER — METHYLPREDNISOLONE SODIUM SUCC 40 MG IJ SOLR
40.0000 mg | Freq: Two times a day (BID) | INTRAMUSCULAR | Status: DC
  Administered 2024-06-26 – 2024-06-28 (×4): 40 mg via INTRAVENOUS
  Filled 2024-06-26 (×4): qty 1

## 2024-06-26 MED ORDER — LACTATED RINGERS IV SOLN: INTRAVENOUS | Status: DC

## 2024-06-26 MED ORDER — SODIUM CHLORIDE 0.9 % IV SOLN
2.0000 g | INTRAVENOUS | Status: AC
  Administered 2024-06-26 – 2024-06-29 (×4): 2 g via INTRAVENOUS
  Filled 2024-06-26 (×4): qty 20

## 2024-06-26 NOTE — Progress Notes (Signed)
 Awake, following command. Routine EEG negative. Low suspicion for stroke or seizures. Neurology will signoff.  Discussed with Dr. Theophilus.  [facility]

## 2024-06-26 NOTE — Progress Notes (Deleted)
 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 Code stroke called. On arrival to ED patient emergently intubated for acute respiratory distress and airway protection. CT Head with no ICH or large infarct, CTA head and neck with no LVO. CXR concerning for a Pneumonia with visible BL lung apices on CTA demonstrating infection/inflammation. Code stroke cancelled with presentation suspicious for sepsis. Patient received 30ml/kg crystalloid, pan cultured, and started empirically on Cefepime/Vanc/Azithromycin . Of note, family reports patient has recently not felt well and likely has had a cold. PCCM consulted for ICU admission.  On exam in ED, patient febrile to >101 and hypotensive, likely multifactorial in setting of presumed sepsis and recent sedation on induction with underlying heart disease. Norepinephrine gtt was initiated for MAPs <65 and patient was transferred to ICU with plans to gain central access for vasopressor administration and arterial line placement for hemodynamic monitoring.  Family at bedside and indicated Full Code status. All questions were addressed at that time.  Pertinent Medical History:    has a past medical history of Abnormal finding on EKG (09/19/2013), Acute on chronic respiratory failure with hypercapnia (HCC), Acute respiratory failure (HCC) (06/08/2016), Anxiety (11/09/2020), Arthritis, Asthma, Carotid bruit (11/09/2020), Chronic diastolic CHF (congestive heart failure) (HCC) (07/28/2017), Chronic  obstructive pulmonary disease, unspecified (HCC) (11/09/2020), COPD, group D, by GOLD 2017 classification (HCC) (09/17/2013), Cough (11/09/2020), Decreased estrogen level (11/09/2020), Diabetes (HCC), Dyspnea, Edema (11/09/2020), Essential hypertension (09/18/2013), Goals of care, counseling/discussion, Hardening of the aorta (main artery of the heart) (11/09/2020), Heart disease, History of radiation therapy, Hypertension, Hypertensive heart failure (HCC) (11/09/2020), Hypoxia (11/09/2020), Insomnia (11/09/2020), Iron deficiency anemia (11/09/2020), Large liver (11/09/2020), Near syncope (09/17/2013), Osteoporosis (11/09/2020), Palliative care encounter, Prediabetes (09/19/2013), Pure hypercholesterolemia (11/09/2020), Raynaud's disease (11/09/2020), Sinus tachycardia (09/18/2013), Skin sensation disturbance (11/09/2020), Smoker (09/17/2013), 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   Significant Hospital Events: Including procedures, antibiotic start and stop dates in addition to other pertinent events   10/21 found unresponsive at home->ED>Code stroke>intubated for ARD/airway protection>CTH/CTA negative>Code stroke cancelled>Hypotensive>started on pressors>Admit to ICU  Interim History / Subjective:   Continues on norepinephrine, vasopressin drips.  Intubated, sedated on the vent  Objective    Blood pressure 98/65, pulse 72, temperature 97.9 F (36.6 C), resp. rate 18, height 5' 1 (1.549 m), weight 71 kg, SpO2 100%. CVP:  [12 mmHg-21 mmHg] 19 mmHg  Vent Mode: PRVC FiO2 (%):  [40 %-100 %] 40 % Set Rate:  [16 bmp-28 bmp] 16 bmp Vt Set:  [380 mL] 380 mL PEEP:  [8 cmH20] 8 cmH20 Plateau Pressure:  [20 cmH20-30 cmH20] 24 cmH20   Intake/Output Summary (Last 24 hours) at 06/26/2024 0751 Last data filed at 06/26/2024 0741 Gross per 24 hour  Intake 4950.45 ml  Output 600 ml  Net 4350.45 ml    Filed Weights   06/25/24 1040  Weight: 71 kg   Examination:   Lab/imaging review Significant for BUN/creatinine 23/0.98 Hemoglobin 9.2, platelets 202  General: Acute on chronically-ill elderly woman lying  in bed HENT: [place]/AT, PERRL 2mm bilaterally Lungs: Coarse lung sounds in middle/lower lung fields bilaterally. Ventilator assisted breaths Cardiovascular: NSR-ST, no m/r/g Abdomen: soft, rounded, nonperitonitic Extremities: Warm, dry Neuro: GCS 6T E1V1M4; on Precedex w/ recent sedation following intubation  Resolved Problem List:   Assessment and Plan:   Acute on chronic hypoxic/hypercarbic respiratory failure secondary to presumed pneumonia/sepsis, currently intubated and mechanically ventilation COPD on 5-6L [place] baseline Hx SCC left lower lobe s/p radiation in September 2025 -CTA: emphysema plus patchy and irregular bilateral interstitial thickening and distal airway patchy opacity suggesting superimposed acute pulmonary infection. -Continue ventilator support and wean FiO2 to maintain SpO2 >88% -Continue lung protective ventilation -Initiate bronchopulmonary hygiene therapies: duonebs, Brovana , Pulmicort , Yupelri -Methylprednisolone  40mg  q12 -ABG PRN -Daily CXR while intubated  Sepsis w/ septic shock (presumed pulmonary source) requiring vasopressors to maintain MAP >65 despite fluid resuscitation  Hx HTN Chronic diastolic heart failure -Sepsis protocol-Received 30ml/kg crystalloid; hypotensive despite appropriate fluid resuscitation -Vasopressors initiated: Continue Norepinephrine and Vasopressin to maintain MAP >65 -Will place arterial line for hemodynamic monitoring and central line for vasopressor administration -Hgb stable 10; no signs of active bleeding -Lactic 1.0 on admission -Last TTE 2024: LVEF 50%; LV w/ low normal function, mild LVH, Grade I diastolic dysfunction -No echo indicated at this time, would reconsider if hemodynamics worsen, new arrhythmia  develops, or if concerned for sepsis-induced cardiomyopathy. -Monitor cbc daily  Mild renal impairment in setting of sepsis -sCr 1.12 on admission (baseline 0.8) -Continue to monitor renal function -Place foley catheter -Continue to monitor I&Os  PAD -Precedex while mechanically ventilated -Acetaminophen  1g q8 -Fentanyl  PRN  FEN -LR @75ml /hr -NPO while intubated; NGT; hold TF in setting of high vasopressor requirements to reduce metabolic demand -Replace electrolytes as indicated  Presumed sepsis secondary to pulmonary source -No leukocytosis; WBC 9.5k -Febrile 101.2 on admission; now 103 -CT Chest with patchy opacities concerning for acute infectious process -RPP+Bcx, MRSA pending -Started on Cefepime/Vanc/Azithromycin  for sepsis/CAP coverage -UA negative -Continue to trend WBC and monitor fever curve -Ordered antipyretics   Critical care time:

## 2024-06-26 NOTE — Progress Notes (Signed)
 PHARMACY - PHYSICIAN COMMUNICATION CRITICAL VALUE ALERT - BLOOD CULTURE IDENTIFICATION (BCID)  Katie Waller is an 78 y.o. female who presented to [facility] on 06/25/2024   Name of physician (or Provider) Contacted: Dr. Haze  Current antibiotics: Vancomycin, Cefepime, Azithromycin   Changes to prescribed antibiotics recommended:  No changes for now  Results for orders placed or performed during the hospital encounter of 06/25/24  Blood Culture ID Panel (Reflexed) (Collected: 06/25/2024  9:34 AM)  Result Value Ref Range   Enterococcus faecalis NOT DETECTED NOT DETECTED   Enterococcus Faecium NOT DETECTED NOT DETECTED   Listeria monocytogenes NOT DETECTED NOT DETECTED   Staphylococcus species DETECTED (A) NOT DETECTED   Staphylococcus aureus (BCID) NOT DETECTED NOT DETECTED   Staphylococcus epidermidis NOT DETECTED NOT DETECTED   Staphylococcus lugdunensis NOT DETECTED NOT DETECTED   Streptococcus species NOT DETECTED NOT DETECTED   Streptococcus agalactiae NOT DETECTED NOT DETECTED   Streptococcus pneumoniae NOT DETECTED NOT DETECTED   Streptococcus pyogenes NOT DETECTED NOT DETECTED   A.calcoaceticus-baumannii NOT DETECTED NOT DETECTED   Bacteroides fragilis NOT DETECTED NOT DETECTED   Enterobacterales NOT DETECTED NOT DETECTED   Enterobacter cloacae complex NOT DETECTED NOT DETECTED   Escherichia coli NOT DETECTED NOT DETECTED   Klebsiella aerogenes NOT DETECTED NOT DETECTED   Klebsiella oxytoca NOT DETECTED NOT DETECTED   Klebsiella pneumoniae NOT DETECTED NOT DETECTED   Proteus species NOT DETECTED NOT DETECTED   Salmonella species NOT DETECTED NOT DETECTED   Serratia marcescens NOT DETECTED NOT DETECTED   Haemophilus influenzae NOT DETECTED NOT DETECTED   Neisseria meningitidis NOT DETECTED NOT DETECTED   Pseudomonas aeruginosa NOT DETECTED NOT DETECTED   Stenotrophomonas maltophilia NOT DETECTED NOT DETECTED   Candida albicans NOT DETECTED NOT DETECTED   Candida  auris NOT DETECTED NOT DETECTED   Candida glabrata NOT DETECTED NOT DETECTED   Candida krusei NOT DETECTED NOT DETECTED   Candida parapsilosis NOT DETECTED NOT DETECTED   Candida tropicalis NOT DETECTED NOT DETECTED   Cryptococcus neoformans/gattii NOT DETECTED NOT DETECTED    Clair Agent 06/26/2024  6:43 AM

## 2024-06-26 NOTE — Progress Notes (Signed)
 EEG complete - results pending

## 2024-06-26 NOTE — Progress Notes (Addendum)
 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 Code stroke called. On arrival to ED patient emergently intubated for acute respiratory distress and airway protection. CT Head with no ICH or large infarct, CTA head and neck with no LVO. CXR concerning for a Pneumonia with visible BL lung apices on CTA demonstrating infection/inflammation. Code stroke cancelled with presentation suspicious for sepsis. Patient received 30ml/kg crystalloid, pan cultured, and started empirically on Cefepime/Vanc/Azithromycin . Of note, family reports patient has recently not felt well and likely has had a cold. PCCM consulted for ICU admission.   On exam in ED, patient febrile to >101 and hypotensive, likely multifactorial in setting of presumed sepsis and recent sedation on induction with underlying heart disease. Norepinephrine gtt was initiated for MAPs < 65 and patient was transferred to ICU with plans to gain central access for vasopressor administration and arterial line placement for hemodynamic monitoring.   Family at bedside and indicated Full Code status. All questions were addressed at that time.  Pertinent  Medical History   Past Medical History:  Diagnosis Date   Abnormal finding on EKG 09/19/2013   Acute on chronic respiratory failure with hypercapnia (HCC)    Acute respiratory failure (HCC) 06/08/2016   Anxiety 11/09/2020   Arthritis    Asthma    Carotid bruit 11/09/2020   Chronic diastolic CHF (congestive heart failure) (HCC)  07/28/2017   Chronic obstructive pulmonary disease, unspecified (HCC) 11/09/2020   COPD, group D, by GOLD 2017 classification (HCC) 09/17/2013   Cough 11/09/2020   Decreased estrogen level 11/09/2020   Diabetes (HCC)    Dyspnea    Edema 11/09/2020   Essential hypertension 09/18/2013   Goals of care, counseling/discussion    Hardening of the aorta (main artery of the heart) 11/09/2020   Heart disease    History of radiation therapy    Left Lung- 06/20/23-06/26/23- Dr. 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   Transient ischemic attack 11/09/2020   Venous stasis of both lower extremities 06/13/2016     Significant Hospital Events: Including procedures, antibiotic start and stop dates in addition to other pertinent events   Found unresponsive at home->ED>Code stroke>intubated for ARD/airway protection>CTH/CTA negative>Code stroke cancelled>Hypotensive>started on pressors>Admit to ICU  Arterial line placed 10/21 10/22 attempting extubation  Interim History / Subjective:  Somnolent but open eyes on command. Intubated.  Objective    Blood pressure 98/65, pulse 85, temperature 97.9 F (36.6 C), resp. rate 17, height 5' 1 (1.549 m), weight 71 kg, SpO2 98%. CVP:  [12 mmHg-21 mmHg] 16 mmHg  Vent Mode: PRVC FiO2 (%):  [40 %-100 %]  40 % Set Rate:  [16 bmp-28 bmp] 16 bmp Vt Set:  [380 mL] 380 mL PEEP:  [5 cmH20-8 cmH20] 5 cmH20 Plateau Pressure:  [20 cmH20-30 cmH20] 24 cmH20   Intake/Output Summary (Last 24 hours) at 06/26/2024  0847 Last data filed at 06/26/2024 0741 Gross per 24 hour  Intake 4950.45 ml  Output 600 ml  Net 4350.45 ml   Filed Weights   06/25/24 1040  Weight: 71 kg    Examination: General: Chronically-ill elderly woman lying in bed, NAD Lungs: Coarse lung sounds bilaterally. Ventilator assisted breaths Cardiovascular: tachycardic, regular, no m/r/g Extremities: Warm, dry   Resolved problem list   Assessment and Plan   Acute on chronic hypoxic/hypercarbic respiratory failure secondary to presumed pneumonia/sepsis, currently intubated with mechanically ventilation COPD on 5-6L [place] baseline Hx SCC left lower lobe s/p radiation in September 2025 CTA: emphysema plus patchy and irregular bilateral interstitial thickening and distal airway patchy opacity suggesting superimposed acute pulmonary infection. Flu/Covid negative. Extended RPP pending. 2/4 BC growing staph (likely contaminant). Strep pneuomo UA negative. Legionella UA pending. Broad spectrum abx de-escalated to cover for CAP. Will start Rocephin  2 g q 24 and continue Zithromax . Stroke/Seizures have been ruled out. -Unsuccessful weaning trial today, continue ventilator support and wean FiO2 to maintain SpO2 >88% -Continue lung protective ventilation -Continue duonebs, Brovana , Pulmicort , Yupelri -Levophed for MAP > 65 -Precedex -Fentanyl  prn -Methylprednisolone  40 mg q12 -ABG PRN -Daily CXR while intubated -Trend labs     Labs   CBC: Recent Labs  Lab 06/25/24 0922 06/25/24 0927 06/25/24 1016 06/25/24 1658 06/25/24 1830 06/26/24 0447  WBC 9.5  --   --   --   --  8.0  NEUTROABS 8.6*  --   --   --   --   --   HGB 10.8* 12.9 10.5* 14.3 9.9* 9.2*  HCT 38.4 38.0 31.0* 42.0 29.0* 30.4*  MCV 101.9*  --   --   --   --  95.9  PLT 295  --   --   --   --  202    Basic Metabolic Panel: Recent Labs  Lab 06/25/24 0922 06/25/24 0927 06/25/24 1016 06/25/24 1658 06/25/24 1824 06/25/24 1830 06/26/24 0447  NA 139 137 139 136   --  135 135  K 5.4* 5.2* 4.7 4.8  --  4.8 4.7  CL 93* 94*  --   --   --   --  102  CO2 36*  --   --   --   --   --  27  GLUCOSE 181* 184*  --   --   --   --  175*  BUN 16 20  --   --   --   --  23  CREATININE 1.12* 1.10*  --   --   --   --  0.98  CALCIUM  9.1  --   --   --   --   --  8.1*  MG  --   --   --   --  1.8  --  2.1  PHOS  --   --   --   --  2.7  --  2.8   GFR: Estimated Creatinine Clearance: 44.7 mL/min (by C-G formula based on SCr of 0.98 mg/dL). Recent Labs  Lab 06/25/24 0922 06/25/24 0927 06/26/24 0447  WBC 9.5  --  8.0  LATICACIDVEN  --  1.0  --     Liver Function Tests: Recent Labs  Lab  06/25/24 0922  AST 24  ALT 11  ALKPHOS 39  BILITOT 0.6  PROT 7.0  ALBUMIN 3.3*   No results for input(s): LIPASE, AMYLASE in the last 168 hours. No results for input(s): AMMONIA in the last 168 hours.  ABG    Component Value Date/Time   PHART 7.4 06/26/2024 0445   PCO2ART 49 (H) 06/26/2024 0445   PO2ART 184 (H) 06/26/2024 0445   HCO3 30.4 (H) 06/26/2024 0445   TCO2 31 06/25/2024 1830   O2SAT 98.3 06/26/2024 0445     Coagulation Profile: Recent Labs  Lab 06/25/24 0922  INR 0.9    Cardiac Enzymes: No results for input(s): CKTOTAL, CKMB, CKMBINDEX, TROPONINI in the last 168 hours.  HbA1C: Hgb A1c MFr Bld  Date/Time Value Ref Range Status  06/26/2024 04:47 AM 5.6 4.8 - 5.6 % Final    Comment:    (NOTE) Diagnosis of Diabetes The following HbA1c ranges recommended by the American Diabetes Association (ADA) may be used as an aid in the diagnosis of diabetes mellitus.  Hemoglobin             Suggested A1C NGSP%              Diagnosis  <5.7                   Non Diabetic  5.7-6.4                Pre-Diabetic  >6.4                   Diabetic  <7.0                   Glycemic control for                       adults with diabetes.    09/17/2013 01:57 PM 6.1 (H) <5.7 % Final    Comment:    (NOTE)                                                                        According to the ADA Clinical Practice Recommendations for 2011, when HbA1c is used as a screening test:  >=6.5%   Diagnostic of Diabetes Mellitus           (if abnormal result is confirmed) 5.7-6.4%   Increased risk of developing Diabetes Mellitus References:Diagnosis and Classification of Diabetes Mellitus,Diabetes Care,2011,34(Suppl 1):S62-S69 and Standards of Medical Care in         Diabetes - 2011,Diabetes Care,2011,34 (Suppl 1):S11-S61.    CBG: Recent Labs  Lab 06/25/24 1355 06/25/24 1650 06/25/24 2019 06/25/24 2331  GLUCAP 143* 151* 201* 157*    Review of Systems:     Past Medical History:  She,  has a past medical history of Abnormal finding on EKG (09/19/2013), Acute on chronic respiratory failure with hypercapnia (HCC), Acute respiratory failure (HCC) (06/08/2016), Anxiety (11/09/2020), Arthritis, Asthma, Carotid bruit (11/09/2020), Chronic diastolic CHF (congestive heart failure) (HCC) (07/28/2017), Chronic obstructive pulmonary disease, unspecified (HCC) (11/09/2020), COPD, group D, by GOLD 2017 classification (HCC) (09/17/2013), Cough (11/09/2020), Decreased estrogen level (11/09/2020), Diabetes (HCC), Dyspnea, Edema (11/09/2020), Essential hypertension (09/18/2013), Goals of care, counseling/discussion, Hardening of the  aorta (main artery of the heart) (11/09/2020), Heart disease, History of radiation therapy, Hypertension, Hypertensive heart failure (HCC) (11/09/2020), Hypoxia (11/09/2020), Insomnia (11/09/2020), Iron deficiency anemia (11/09/2020), Large liver (11/09/2020), Near syncope (09/17/2013), Osteoporosis (11/09/2020), Palliative care encounter, Prediabetes (09/19/2013), Pure hypercholesterolemia (11/09/2020), Raynaud's disease (11/09/2020), Sinus tachycardia (09/18/2013), Skin sensation disturbance (11/09/2020), Smoker (09/17/2013), 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   Surgical History:   Past Surgical History:  Procedure Laterality Date   BRONCHIAL BIOPSY  05/25/2023   Procedure: BRONCHIAL BIOPSIES;  Surgeon: Brenna Adine CROME, DO;  Location: [facility];  Service: Pulmonary;;   BRONCHIAL NEEDLE ASPIRATION BIOPSY  05/25/2023   Procedure: BRONCHIAL NEEDLE ASPIRATION BIOPSIES;  Surgeon: Brenna Adine CROME, DO;  Location: [facility];  Service: Pulmonary;;   FIDUCIAL MARKER PLACEMENT  05/25/2023   Procedure: FIDUCIAL MARKER PLACEMENT;  Surgeon: Brenna Adine CROME, DO;  Location: [facility];  Service: Pulmonary;;   KNEE SURGERY Left      Social History:   reports that she quit smoking about 6 years ago. Her smoking use included cigarettes. She started smoking about 6 years ago. She has a 55 pack-year smoking history. She has never used smokeless tobacco. She reports that she does not drink alcohol and does not use drugs.   Family History:  Her family history includes Hyperlipidemia in her father and mother; Hypertension in her father and mother.   Allergies Allergies  Allergen Reactions   Ace Inhibitors Swelling   Diphenhydramine Other (See Comments)    Hyperactive/jittery    Doxycycline Hyclate Nausea And Vomiting   Codeine Nausea And Vomiting     Home Medications  Prior to Admission medications   Medication Sig Start Date End Date Taking? Authorizing Provider  acetaminophen  (TYLENOL ) 500 MG tablet Take 1,000 mg by mouth every 6 (six) hours as needed for moderate pain.   Yes [provider]  albuterol  (PROVENTIL  HFA;VENTOLIN  HFA) 108 (90 BASE) MCG/ACT inhaler Inhale 2 puffs into the lungs every 6 (six) hours as needed for wheezing or shortness of breath. 09/19/13  Yes Johnson, Clanford L, MD  aspirin  81 MG chewable tablet Chew 1 tablet (81 mg total) by mouth daily. 09/19/13  Yes Johnson, Clanford L, MD  atorvastatin  (LIPITOR) 10 MG tablet TAKE 1 TABLET BY MOUTH EVERY DAY 07/21/23  Yes Krasowski, Robert J,  MD  benzonatate  (TESSALON ) 100 MG capsule Take 100 mg by mouth 3 (three) times daily as needed for cough.    Yes [provider]  Biotin 10 MG CAPS Take 10 mg by mouth daily.   Yes [provider]  budesonide -formoterol  (SYMBICORT ) 160-4.5 MCG/ACT inhaler Inhale 2 puffs into the lungs 2 (two) times daily. 08/23/19  Yes Mannam, Praveen, MD  calcium  carbonate (OSCAL) 1500 (600 Ca) MG TABS tablet Take 600 mg of elemental calcium  by mouth daily.   Yes [provider]  Cholecalciferol (VITAMIN D ) 50 MCG (2000 UT) tablet Take 2,000 Units by mouth daily.   Yes [provider]  diphenhydramine-acetaminophen  (TYLENOL  PM) 25-500 MG TABS tablet Take 1 tablet by mouth at bedtime.   Yes [provider]  furosemide  (LASIX ) 40 MG tablet Take 40 mg by mouth daily.   Yes [provider]  gabapentin (NEURONTIN) 300 MG capsule Take 900 mg by mouth at bedtime.   Yes [provider]  iron polysaccharides (NIFEREX) 150 MG capsule Take 150 mg by mouth daily. 11/05/20  Yes [provider]  loratadine (CLARITIN) 10  MG tablet Take 10 mg by mouth daily as needed for allergies.   Yes [provider]  LORazepam  (ATIVAN ) 1 MG tablet Take 1 tablet (1 mg total) by mouth every 6 (six) hours as needed for anxiety. 08/07/17  Yes Drusilla Sabas RAMAN, MD  losartan  (COZAAR ) 50 MG tablet Take 50 mg by mouth daily.   Yes [provider]  Multiple Vitamin (MULTIVITAMIN WITH MINERALS) TABS tablet Take 1 tablet by mouth daily.   Yes [provider]  raloxifene  (EVISTA ) 60 MG tablet Take 60 mg by mouth every morning. 08/07/20  Yes [provider]  Tiotropium Bromide  Monohydrate (SPIRIVA  RESPIMAT) 2.5 MCG/ACT AERS Inhale 1 puff into the lungs daily.   Yes [provider]  traZODone  (DESYREL ) 100 MG tablet Take 1 tablet (100 mg total) by mouth at bedtime as needed for sleep. 08/07/17  Yes Drusilla Sabas RAMAN, MD   Norman Lobstein, DO

## 2024-06-26 NOTE — Procedures (Signed)
 Patient Name: Katie Waller  MRN: [id number]  Epilepsy Attending: Arlin MALVA Krebs  Referring Physician/Provider: Khaliqdina, Salman, MD  Date: 06/26/2024  Duration: 29.47 mins  Patient history: 78 y.o. female with hx of HTN, DM, CHF, COPD, lung cancer who was brought in by EMS as a code stroke due to acute onset of unresponsiveness. EEG to evaluate for seizure  Level of alertness: comatose/ lethargic   AEDs during EEG study: None  Technical aspects: This EEG study was done with scalp electrodes positioned according to the 10-20 International system of electrode placement. Electrical activity was reviewed with band pass filter of 1-70Hz , sensitivity of 7 uV/mm, display speed of 11mm/sec with a 60Hz  notched filter applied as appropriate. EEG data were recorded continuously and digitally stored.  Video monitoring was available and reviewed as appropriate.  Description: EEG showed continuous generalized predominantly 6  to 8 Hz theta-alpha activity admixed with intermittent 2-3hz  delta slowing. Hyperventilation and photic stimulation were not performed.     ABNORMALITY - Continuous slow, generalized  IMPRESSION: This study is suggestive of generalized cerebral dysfunction (encephalopathy). No seizures or epileptiform discharges were seen throughout the recording.  Katie Waller

## 2024-06-27 DIAGNOSIS — J441 Chronic obstructive pulmonary disease with (acute) exacerbation: Secondary | ICD-10-CM | POA: Diagnosis not present

## 2024-06-27 DIAGNOSIS — J9622 Acute and chronic respiratory failure with hypercapnia: Secondary | ICD-10-CM | POA: Diagnosis not present

## 2024-06-27 DIAGNOSIS — B9781 Human metapneumovirus as the cause of diseases classified elsewhere: Secondary | ICD-10-CM | POA: Diagnosis not present

## 2024-06-27 DIAGNOSIS — J9621 Acute and chronic respiratory failure with hypoxia: Secondary | ICD-10-CM | POA: Diagnosis not present

## 2024-06-27 LAB — GLUCOSE, CAPILLARY
Glucose-Capillary: 112 mg/dL — ABNORMAL HIGH (ref 70–99)
Glucose-Capillary: 149 mg/dL — ABNORMAL HIGH (ref 70–99)
Glucose-Capillary: 153 mg/dL — ABNORMAL HIGH (ref 70–99)
Glucose-Capillary: 167 mg/dL — ABNORMAL HIGH (ref 70–99)
Glucose-Capillary: 178 mg/dL — ABNORMAL HIGH (ref 70–99)
Glucose-Capillary: 208 mg/dL — ABNORMAL HIGH (ref 70–99)

## 2024-06-27 LAB — COMPREHENSIVE METABOLIC PANEL WITH GFR
ALT: 107 U/L — ABNORMAL HIGH (ref 0–44)
AST: 91 U/L — ABNORMAL HIGH (ref 15–41)
Albumin: 2.5 g/dL — ABNORMAL LOW (ref 3.5–5.0)
Alkaline Phosphatase: 37 U/L — ABNORMAL LOW (ref 38–126)
Anion gap: 9 (ref 5–15)
BUN: 25 mg/dL — ABNORMAL HIGH (ref 8–23)
CO2: 28 mmol/L (ref 22–32)
Calcium: 8.2 mg/dL — ABNORMAL LOW (ref 8.9–10.3)
Chloride: 102 mmol/L (ref 98–111)
Creatinine, Ser: 1.01 mg/dL — ABNORMAL HIGH (ref 0.44–1.00)
GFR, Estimated: 58 mL/min — ABNORMAL LOW (ref >60)
Glucose, Bld: 143 mg/dL — ABNORMAL HIGH (ref 70–99)
Potassium: 4.8 mmol/L (ref 3.5–5.1)
Sodium: 139 mmol/L (ref 135–145)
Total Bilirubin: 0.2 mg/dL (ref 0.0–1.2)
Total Protein: 5.7 g/dL — ABNORMAL LOW (ref 6.5–8.1)

## 2024-06-27 LAB — CBC
HCT: 30.4 % — ABNORMAL LOW (ref 36.0–46.0)
Hemoglobin: 9.2 g/dL — ABNORMAL LOW (ref 12.0–15.0)
MCH: 29 pg (ref 26.0–34.0)
MCHC: 30.3 g/dL (ref 30.0–36.0)
MCV: 95.9 fL (ref 80.0–100.0)
Platelets: 220 K/uL (ref 150–400)
RBC: 3.17 MIL/uL — ABNORMAL LOW (ref 3.87–5.11)
RDW: 12.8 % (ref 11.5–15.5)
WBC: 9.8 K/uL (ref 4.0–10.5)
nRBC: 0 % (ref 0.0–0.2)

## 2024-06-27 LAB — PHOSPHORUS: Phosphorus: 3.7 mg/dL (ref 2.5–4.6)

## 2024-06-27 LAB — MAGNESIUM: Magnesium: 2.3 mg/dL (ref 1.7–2.4)

## 2024-06-27 MED ORDER — FUROSEMIDE 10 MG/ML IJ SOLN
20.0000 mg | Freq: Once | INTRAMUSCULAR | Status: AC
  Administered 2024-06-27: 20 mg via INTRAVENOUS
  Filled 2024-06-27: qty 2

## 2024-06-27 MED ORDER — OSMOLITE 1.2 CAL PO LIQD
1000.0000 mL | ORAL | Status: DC
  Administered 2024-06-27 – 2024-06-30 (×3): 1000 mL
  Filled 2024-06-27 (×7): qty 1000

## 2024-06-27 NOTE — Progress Notes (Signed)
 Attending note: I have seen and examined the patient. History, labs and imaging reviewed.  77 Y/O with severe COPD, lung cncaer, chronic resp failure Presented with AMS, acute resp failure due to PNA, HMPV infection Of pressors. Failing PSV weans today   Blood pressure 107/65, pulse 81, temperature 99.3 F (37.4 C), resp. rate 16, height 5' 1 (1.549 m), weight 74 kg, SpO2 100%. Somnolent, arousable.  Intermittently agitated on the vent Lungs with scattered wheeze, rhonchi Heart rate is regular rate and rhythm Abdomen soft, nontender  Labs/Imaging personally reviewed, significant for Procalcitonin 2.69  Assessment/plan: COPD exacerbation secondary to HMPV infection, CAP PSV weans as tolerated Continue ceftriaxone  and azithromycin  as Pct is elevated Bcx 2/4 staph. May be contaminant. No need for vanco as MRSA swab is negative Repeat blood cultures Urine strep pneumo, legionella negative Lasix  20mg  IV today to diurese. Stop IVF.  Start tube feeds   Family updated at bedside  The patient is critically ill with multiple organ systems failure and requires high complexity decision making for assessment and support, frequent evaluation and titration of therapies, application of advanced monitoring technologies and extensive interpretation of multiple databases.  Critical care time - 38 mins. This represents my time independent of the NPs time taking care of the pt.  Davanta Meuser MD [facility] 06/27/2024, 9:28 AM

## 2024-06-27 NOTE — TOC Initial Note (Signed)
 Transition of Care [facility]) - Initial/Assessment Note    Patient Details  Name: Katie Waller MRN: [id number] Date of Birth: [birth year]  Transition of [facility]) CM/SW Contact:    Lauraine FORBES Saa, LCSWA Phone Number: 06/27/2024, 4:12 PM  Clinical Narrative:              4:12 PM CSW introduced self and role to patient's daughter, [name] (per chart review, patient is currently intubated). [name] confirmed she resides at home with patient and provides transportation for patient. [name] confirmed patient's SNF history with [facility] and HH history with [facility]. [name] confirmed patient has DME (concentrator, nebulizer, rollator, wheelchair) history (per chart review, DME history with [facility] and [facility]). TOC will continue to follow.  Expected Discharge Plan: Home/Self Care Barriers to Discharge: Continued Medical Work up   Patient Goals and CMS Choice            Expected Discharge Plan and Services       Living arrangements for the past 2 months: Single Family Home                                      Prior Living Arrangements/Services Living arrangements for the past 2 months: Single Family Home Lives with:: Adult Children Patient language and need for interpreter reviewed:: Yes        Need for Family Participation in Patient Care: Yes (Comment)   Current home services: DME Criminal Activity/Legal Involvement Pertinent to Current Situation/Hospitalization: No - Comment as needed  Activities of Daily Living      Permission Sought/Granted Permission sought to share information with : Family Supports Permission granted to share information with : No (Contact information on chart)  Share Information with NAME: Maida Lunger     Permission granted to share info w Relationship: Daughter  Permission granted to share info w Contact Information: [phone]  Emotional Assessment Appearance:: Appears stated age Attitude/Demeanor/Rapport: Unable to Assess,  Intubated (Following Commands or Not Following Commands) Affect (typically observed): Unable to Assess   Alcohol / Substance Use: Not Applicable Psych Involvement: No (comment)  Admission diagnosis:  Septic shock (HCC) [A41.9, R65.21] Sepsis (HCC) [A41.9] Patient Active Problem List   Diagnosis Date Noted   Sepsis (HCC) 06/25/2024   Hypotension 06/25/2024   Septic shock (HCC) 06/25/2024   Chronic hypoxemic respiratory failure (HCC) 02/15/2024   Cancer of lower lobe of left lung (HCC) 02/15/2024   Bilateral carotid artery stenosis 02/15/2024   Lung nodule 05/18/2023   Chronic kidney disease, stage 3a (HCC) 11/26/2021   Vitamin D  deficiency 11/26/2021   Carotid arterial disease 11/26/2021   Anxiety 11/09/2020   Carotid bruit 11/09/2020   Cough 11/09/2020   Edema 11/09/2020   Hardening of the aorta (main artery of the heart) 11/09/2020   Hypertensive heart failure (HCC) 11/09/2020   Insomnia 11/09/2020   Large liver 11/09/2020   Multiple pulmonary nodules 11/09/2020   Osteoporosis 11/09/2020   History of colonic polyps 11/09/2020   Pure hypercholesterolemia 11/09/2020   Raynaud's disease 11/09/2020   Skin sensation disturbance 11/09/2020   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   Transient ischemic attack 11/09/2020   Hypoxia 11/09/2020   Chronic obstructive pulmonary disease, unspecified (HCC) 11/09/2020   Decreased estrogen level 11/09/2020   Hypertension    Heart disease    Diabetes (HCC)    Asthma    Acute on chronic respiratory failure with hypercapnia (HCC)  Palliative care encounter    Goals of care, counseling/discussion    Chronic diastolic CHF (congestive heart failure) (HCC) 07/28/2017   Venous stasis of both lower extremities 06/13/2016   Acute respiratory failure (HCC) 06/08/2016   Abnormal finding on EKG 09/19/2013   Prediabetes 09/19/2013   Sinus tachycardia 09/18/2013   Essential hypertension 09/18/2013   Near syncope 09/17/2013   COPD, group  D, by GOLD 2017 classification (HCC) 09/17/2013   Smoker 09/17/2013   PCP:  Sun, Vyvyan, MD Pharmacy:   [facility] [phone] - [place], [place] - [address] [place] [address] Phone: [phone] Fax: [phone]     Social Drivers of Health (SDOH) Social History: SDOH Screenings   Food Insecurity: No Food Insecurity (06/05/2023)  Housing: Low Risk  (06/05/2023)  Transportation Needs: No Transportation Needs (06/05/2023)  Utilities: Not At Risk (06/05/2023)  Depression (PHQ2-9): Low Risk  (06/05/2023)  Tobacco Use: Medium Risk (06/25/2024)   SDOH Interventions:     Readmission Risk Interventions     No data to display

## 2024-06-27 NOTE — Progress Notes (Signed)
 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 Code stroke called. On arrival to ED patient emergently intubated for acute respiratory distress and airway protection. CT Head with no ICH or large infarct, CTA head and neck with no LVO. CXR concerning for a Pneumonia with visible BL lung apices on CTA demonstrating infection/inflammation. Code stroke cancelled with presentation suspicious for sepsis. Patient received 30ml/kg crystalloid, pan cultured, and started empirically on Cefepime/Vanc/Azithromycin . Of note, family reports patient has recently not felt well and likely has had a cold. PCCM consulted for ICU admission.   On exam in ED, patient febrile to >101 and hypotensive, likely multifactorial in setting of presumed sepsis and recent sedation on induction with underlying heart disease. Norepinephrine gtt was initiated for MAPs < 65 and patient was transferred to ICU with plans to gain central access for vasopressor administration and arterial line placement for hemodynamic monitoring.   Family at bedside and indicated Full Code status. All questions were addressed at that time.  Pertinent  Medical History  As noted above  Significant Hospital Events: Including procedures, antibiotic start and stop dates in addition to other pertinent events   Found unresponsive at home->ED>Code stroke>intubated for ARD/airway protection>CTH/CTA negative>Code stroke cancelled>Hypotensive>started on pressors>Admit to ICU  Arterial line  placed 10/21 10/22 unsuccessful extubation   Interim History / Subjective:  Sedated  Objective    Blood pressure 125/68, pulse 83, temperature 100.2 F (37.9 C), resp. rate 16, height 5' 1 (1.549 m), weight 74 kg, SpO2 99%. CVP:  [11 mmHg-22 mmHg] 22 mmHg  Vent Mode: PRVC FiO2 (%):  [40 %] 40 % Set Rate:  [16 bmp] 16 bmp Vt Set:  [380 mL-420 mL] 420 mL PEEP:  [5 cmH20] 5 cmH20 Pressure Support:  [5 cmH20-10 cmH20] 5 cmH20 Plateau Pressure:  [22 cmH20] 22 cmH20   Intake/Output Summary (Last 24 hours) at 06/27/2024 0820 Last data filed at 06/27/2024 0600 Gross per 24 hour  Intake 1797.39 ml  Output 775 ml  Net 1022.39 ml   Filed Weights   06/25/24 1040 06/27/24 0500  Weight: 71 kg 74 kg    Examination: General: Chronically-ill elderly woman lying in bed, NAD, sedated Lungs: Coarse lung sounds bilaterally. 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 Metapneumovirus positive. Procalc 2.69.  Strep pneuomo/Legionella UA negative. Will continue Rocephin  2 g q 24 given procalc and continue Zithromax . Stroke/Seizures have been ruled out. -Unsuccessful weaning trial yesterday, will attempt again today and start tube feeds if extubation is not successful. Levophed dc for now. Will dc fluids and give IV Lasix  20 X 1. Bladder scan. In addition to above: -Continue lung protective ventilation -  Continue duonebs, Brovana , Pulmicort , Yupelri -Precedex -Fentanyl   prn -Methylprednisolone  40 mg q12 -ABG PRN -Trend labs  Positive Blood Cultures 2/4 BC growing staph (likely contaminant). Will repeat today. MRSA swab negative.  Labs   CBC: Recent Labs  Lab 06/25/24 0922 06/25/24 0927 06/25/24 1016 06/25/24 1658 06/25/24 1830 06/26/24 0447 06/27/24 0451  WBC 9.5  --   --   --   --  8.0 9.8  NEUTROABS 8.6*  --   --   --   --   --   --   HGB 10.8*   < > 10.5* 14.3 9.9* 9.2* 9.2*  HCT 38.4   < > 31.0* 42.0 29.0* 30.4* 30.4*  MCV 101.9*  --   --   --   --  95.9 95.9  PLT 295  --   --   --   --  202 220   < > = values in this interval not displayed.    Basic Metabolic Panel: Recent Labs  Lab 06/25/24 0922 06/25/24 0927 06/25/24 1016 06/25/24 1658 06/25/24 1824 06/25/24 1830 06/26/24 0447 06/27/24 0451  NA 139 137 139 136  --  135 135 139  K 5.4* 5.2* 4.7 4.8  --  4.8 4.7 4.8  CL 93* 94*  --   --   --   --  102 102  CO2 36*  --   --   --   --   --  27 28  GLUCOSE 181* 184*  --   --   --   --  175* 143*  BUN 16 20  --   --   --   --  23 25*  CREATININE 1.12* 1.10*  --   --   --   --  0.98 1.01*  CALCIUM  9.1  --   --   --   --   --  8.1* 8.2*  MG  --   --   --   --  1.8  --  2.1  --   PHOS  --   --   --   --  2.7  --  2.8  --    GFR: Estimated Creatinine Clearance: 44.3 mL/min (A) (by C-G formula based on SCr of 1.01 mg/dL (H)). Recent Labs  Lab 06/25/24 0922 06/25/24 0927 06/26/24 0445 06/26/24 0447 06/27/24 0451  PROCALCITON  --   --  2.69  --   --   WBC 9.5  --   --  8.0 9.8  LATICACIDVEN  --  1.0  --   --   --     Liver Function Tests: Recent Labs  Lab 06/25/24 0922 06/27/24 0451  AST 24 91*  ALT 11 107*  ALKPHOS 39 37*  BILITOT 0.6 0.2  PROT 7.0 5.7*  ALBUMIN 3.3* 2.5*   No results for input(s): LIPASE, AMYLASE in the last 168 hours. No results for input(s): AMMONIA in the last 168 hours.  ABG    Component Value Date/Time   PHART 7.4 06/26/2024 0445   PCO2ART 49 (H) 06/26/2024 0445   PO2ART 184  (H) 06/26/2024 0445   HCO3 30.4 (H) 06/26/2024 0445   TCO2 31 06/25/2024 1830   O2SAT 98.3 06/26/2024 0445     Coagulation Profile: Recent Labs  Lab 06/25/24 0922  INR 0.9    Cardiac Enzymes: No results for input(s): CKTOTAL, CKMB, CKMBINDEX, TROPONINI in the last 168 hours.  HbA1C: Hgb A1c MFr Bld  Date/Time Value Ref Range Status  06/26/2024 04:47  AM 5.6 4.8 - 5.6 % Final    Comment:    (NOTE) Diagnosis of Diabetes The following HbA1c ranges recommended by the American Diabetes Association (ADA) may be used as an aid in the diagnosis of diabetes mellitus.  Hemoglobin             Suggested A1C NGSP%              Diagnosis  <5.7                   Non Diabetic  5.7-6.4                Pre-Diabetic  >6.4                   Diabetic  <7.0                   Glycemic control for                       adults with diabetes.    09/17/2013 01:57 PM 6.1 (H) <5.7 % Final    Comment:    (NOTE)                                                                       According to the ADA Clinical Practice Recommendations for 2011, when HbA1c is used as a screening test:  >=6.5%   Diagnostic of Diabetes Mellitus           (if abnormal result is confirmed) 5.7-6.4%   Increased risk of developing Diabetes Mellitus References:Diagnosis and Classification of Diabetes Mellitus,Diabetes Care,2011,34(Suppl 1):S62-S69 and Standards of Medical Care in         Diabetes - 2011,Diabetes Care,2011,34 (Suppl 1):S11-S61.    CBG: Recent Labs  Lab 06/26/24 1518 06/26/24 1924 06/26/24 2259 06/27/24 0316 06/27/24 0733  GLUCAP 119* 144* 146* 153* 112*    Review of Systems:     Past Medical History:  She,  has a past medical history of Abnormal finding on EKG (09/19/2013), Acute on chronic respiratory failure with hypercapnia (HCC), Acute respiratory failure (HCC) (06/08/2016), Anxiety (11/09/2020), Arthritis, Asthma, Carotid bruit (11/09/2020), Chronic diastolic CHF (congestive  heart failure) (HCC) (07/28/2017), Chronic obstructive pulmonary disease, unspecified (HCC) (11/09/2020), COPD, group D, by GOLD 2017 classification (HCC) (09/17/2013), Cough (11/09/2020), Decreased estrogen level (11/09/2020), Diabetes (HCC), Dyspnea, Edema (11/09/2020), Essential hypertension (09/18/2013), Goals of care, counseling/discussion, Hardening of the aorta (main artery of the heart) (11/09/2020), Heart disease, History of radiation therapy, Hypertension, Hypertensive heart failure (HCC) (11/09/2020), Hypoxia (11/09/2020), Insomnia (11/09/2020), Iron deficiency anemia (11/09/2020), Large liver (11/09/2020), Near syncope (09/17/2013), Osteoporosis (11/09/2020), Palliative care encounter, Prediabetes (09/19/2013), Pure hypercholesterolemia (11/09/2020), Raynaud's disease (11/09/2020), Sinus tachycardia (09/18/2013), Skin sensation disturbance (11/09/2020), Smoker (09/17/2013), 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   Surgical History:   Past Surgical History:  Procedure Laterality Date   BRONCHIAL BIOPSY  05/25/2023   Procedure: BRONCHIAL BIOPSIES;  Surgeon: Brenna Adine CROME, DO;  Location: [facility];  Service: Pulmonary;;   BRONCHIAL NEEDLE ASPIRATION BIOPSY  05/25/2023   Procedure: BRONCHIAL NEEDLE ASPIRATION BIOPSIES;  Surgeon: Brenna Adine CROME, DO;  Location: [facility];  Service: Pulmonary;;   FIDUCIAL MARKER PLACEMENT  05/25/2023   Procedure: FIDUCIAL MARKER PLACEMENT;  Surgeon: Brenna Adine CROME, DO;  Location: [facility];  Service: Pulmonary;;   KNEE SURGERY Left      Social History:   reports that she quit smoking about 6 years ago. Her smoking use included cigarettes. She started smoking about 6 years ago. She has a 55 pack-year smoking history. She has never used smokeless tobacco. She reports that she does not drink alcohol and does not use drugs.   Family History:  Her  family history includes Hyperlipidemia in her father and mother; Hypertension in her father and mother.   Allergies Allergies  Allergen Reactions   Ace Inhibitors Swelling   Diphenhydramine Other (See Comments)    Hyperactive/jittery    Doxycycline Hyclate Nausea And Vomiting   Codeine Nausea And Vomiting     Home Medications  Prior to Admission medications   Medication Sig Start Date End Date Taking? Authorizing Provider  acetaminophen  (TYLENOL ) 500 MG tablet Take 1,000 mg by mouth every 6 (six) hours as needed for moderate pain.   Yes [provider]  albuterol  (PROVENTIL  HFA;VENTOLIN  HFA) 108 (90 BASE) MCG/ACT inhaler Inhale 2 puffs into the lungs every 6 (six) hours as needed for wheezing or shortness of breath. 09/19/13  Yes Johnson, Clanford L, MD  aspirin  81 MG chewable tablet Chew 1 tablet (81 mg total) by mouth daily. 09/19/13  Yes Johnson, Clanford L, MD  atorvastatin  (LIPITOR) 10 MG tablet TAKE 1 TABLET BY MOUTH EVERY DAY 07/21/23  Yes Krasowski, Robert J, MD  benzonatate  (TESSALON ) 100 MG capsule Take 100 mg by mouth 3 (three) times daily as needed for cough.    Yes [provider]  Biotin 10 MG CAPS Take 10 mg by mouth daily.   Yes [provider]  budesonide -formoterol  (SYMBICORT ) 160-4.5 MCG/ACT inhaler Inhale 2 puffs into the lungs 2 (two) times daily. 08/23/19  Yes Mannam, Praveen, MD  calcium  carbonate (OSCAL) 1500 (600 Ca) MG TABS tablet Take 600 mg of elemental calcium  by mouth daily.   Yes [provider]  Cholecalciferol (VITAMIN D ) 50 MCG (2000 UT) tablet Take 2,000 Units by mouth daily.   Yes [provider]  diphenhydramine-acetaminophen  (TYLENOL  PM) 25-500 MG TABS tablet Take 1 tablet by mouth at bedtime.   Yes [provider]  furosemide  (LASIX ) 40 MG tablet Take 40 mg by mouth daily.   Yes [provider]  gabapentin (NEURONTIN) 300 MG capsule Take 900 mg by mouth at bedtime.   Yes [provider]  iron polysaccharides (NIFEREX) 150 MG capsule Take 150 mg by mouth daily. 11/05/20  Yes [provider]  loratadine (CLARITIN) 10 MG tablet Take 10 mg by mouth daily as needed for allergies.   Yes [provider]  LORazepam  (ATIVAN ) 1 MG tablet Take 1 tablet (1 mg total) by mouth every 6 (six) hours as needed for anxiety. 08/07/17  Yes Drusilla Sabas RAMAN, MD  losartan  (COZAAR ) 50 MG tablet Take 50 mg by mouth daily.   Yes [provider]  Multiple Vitamin (MULTIVITAMIN WITH MINERALS) TABS tablet Take 1 tablet by mouth daily.   Yes [provider]  raloxifene  (EVISTA ) 60 MG tablet Take 60 mg by mouth every morning. 08/07/20  Yes [provider]  Tiotropium Bromide  Monohydrate (SPIRIVA  RESPIMAT) 2.5 MCG/ACT AERS Inhale 1 puff into the lungs daily.   Yes [provider]  traZODone  (DESYREL ) 100 MG tablet Take 1 tablet (  100 mg total) by mouth at bedtime as needed for sleep. 08/07/17  Yes Drusilla Sabas RAMAN, MD    Norman Lobstein, DO

## 2024-06-27 NOTE — Progress Notes (Signed)
 Initial Nutrition Assessment  DOCUMENTATION CODES:  Not applicable  INTERVENTION:  Initiate tube feeds via OGT: Osmolite 1.2 at 74ml/hr ( per day) *start at 20ml and advance by 10ml q8h to goal rate Provides 1440 kcal, 67g protein and free water daily  NUTRITION DIAGNOSIS:  Inadequate oral intake related to acute illness as evidenced by NPO status.  GOAL:  Patient will meet greater than or equal to 90% of their needs  MONITOR:  Vent status, Labs, Weight trends, TF tolerance  REASON FOR ASSESSMENT:  Ventilator    ASSESSMENT:  Pt admitted with AMS, sepsis, ARF. 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  10/21 admitted d/t PNA, HMPV infection; intubated; stroke/sz ruled out 10/23 failed vent wean; initiate tube feeds  Patient is currently intubated on ventilator support MV: 6.1 L/min Temp (24hrs), Avg:98.3 F (36.8 C), Min:96.8 F (36 C), Max:100.8 F (38.2 C)  Spoke with CCM. Plan to re-assess later this morning and attempt vent went. If unable to wean, amenable to initiation of tube feeds.   Spoke with pt's daughter at bedside.  She reports pt was starting to feel off last Thursday and had an increase in mucus. Monday morning was the last meal that she had consumed and they knew she was feeling unwell when she turned down fried fish, her favorite meal, for dinner that night.   Pt lives at home with her daughter. They moved to a ranch style/one story home in June. Since then she has been more active and eating slightly less than baseline d/t this. She uses a walker for mobility but has to often take breaks d/t shortness of breath.  Pt's typical dietary recall includes 2 meals (breakfast and dinner) and generally 2 snacks daily.  She enjoys homemade sausage for breakfast with bread. Snacks include a peanut butter sandwich, popcorn or fruit. Dinner always is well  balanced and includes a protein, starch and vegetable.  Her beverage intake includes ~two 16 oz water bottles, a diet green tea and coffee.   Pt does not consume protein supplements but does take a MVI, Vitamin D  and high dose iron. The iron does often cause constipation therefore they will scale back the dose at times to help with this.   Pt's daughter states that d/t increased activity over the summer she's lost about 11 lbs however has since regained ~3 lbs. Her usual body weight is about 145 lbs.  Review of documented weights within the last year are variable; reflect a potential decline of 7.4% between 07/2023-03/2024 and then and an increase from 66.7 kg in July up to 71 kg on 10/21 at time of admission.   Admit weight: 71 kg  Current weight: 74 kg  Drains/lines: CVC triple lumen Right fem a line OGT (tip within the gastric fundus) UOP  Medications: SSI 0-15 units q4h, solu-medrol  BID, miralax , senna daily, IV abx  Labs:   BUN 25 Cr 1.01 AST 91 ALT 107 GFR 58 CBG's 112-178 x24 hours  NUTRITION - FOCUSED PHYSICAL EXAM: Flowsheet Row Most Recent Value  Orbital Region No depletion  Upper Arm Region No depletion  Thoracic and Lumbar Region No depletion  Buccal Region Unable to assess  Temple Region Mild depletion  Clavicle Bone Region Mild depletion  Clavicle and Acromion Bone Region No depletion  Scapular Bone Region No depletion  Dorsal Hand Unable to assess  [hand mits]  Patellar Region No depletion  Anterior  Thigh Region No depletion  Posterior Calf Region No depletion  Edema (RD Assessment) None  Hair Reviewed  Eyes Unable to assess  Mouth Unable to assess  Skin Reviewed  Nails Unable to assess    Diet Order:   Diet Order             Diet NPO time specified  Diet effective now                   EDUCATION NEEDS:  No education needs have been identified at this time  Skin:  Skin Assessment: Reviewed RN Assessment  Last BM:   PTA/unknown  Height:  Ht Readings from Last 1 Encounters:  06/25/24 5' 1 (1.549 m)    Weight:  Wt Readings from Last 1 Encounters:  06/27/24 74 kg    Ideal Body Weight:  47.7 kg  BMI:  Body mass index is 30.83 kg/m.  Estimated Nutritional Needs:   Kcal:  1400-1600  Protein:  70-80g  Fluid:  >/=1.5L  Royce Maris, RDN, LDN Clinical Nutrition See [facility] for contact information.

## 2024-06-28 DIAGNOSIS — J9622 Acute and chronic respiratory failure with hypercapnia: Secondary | ICD-10-CM

## 2024-06-28 DIAGNOSIS — J9621 Acute and chronic respiratory failure with hypoxia: Secondary | ICD-10-CM

## 2024-06-28 DIAGNOSIS — A419 Sepsis, unspecified organism: Secondary | ICD-10-CM | POA: Diagnosis not present

## 2024-06-28 DIAGNOSIS — B9781 Human metapneumovirus as the cause of diseases classified elsewhere: Secondary | ICD-10-CM | POA: Diagnosis not present

## 2024-06-28 LAB — POCT I-STAT 7, (LYTES, BLD GAS, ICA,H+H)
Acid-Base Excess: 1 mmol/L (ref 0.0–2.0)
Acid-Base Excess: 2 mmol/L (ref 0.0–2.0)
Bicarbonate: 30.4 mmol/L — ABNORMAL HIGH (ref 20.0–28.0)
Bicarbonate: 29.8 mmol/L — ABNORMAL HIGH (ref 20.0–28.0)
Calcium, Ion: 1.31 mmol/L (ref 1.15–1.40)
Calcium, Ion: 1.31 mmol/L (ref 1.15–1.40)
HCT: 31 % — ABNORMAL LOW (ref 36.0–46.0)
HCT: 30 % — ABNORMAL LOW (ref 36.0–46.0)
Hemoglobin: 10.5 g/dL — ABNORMAL LOW (ref 12.0–15.0)
Hemoglobin: 10.2 g/dL — ABNORMAL LOW (ref 12.0–15.0)
O2 Saturation: 94 %
O2 Saturation: 98 %
Potassium: 4.9 mmol/L (ref 3.5–5.1)
Potassium: 4.5 mmol/L (ref 3.5–5.1)
Sodium: 141 mmol/L (ref 135–145)
Sodium: 141 mmol/L (ref 135–145)
TCO2: 33 mmol/L — ABNORMAL HIGH (ref 22–32)
TCO2: 32 mmol/L (ref 22–32)
pCO2 arterial: 78.4 mmHg (ref 32–48)
pCO2 arterial: 64 mmHg — ABNORMAL HIGH (ref 32–48)
pH, Arterial: 7.197 — CL (ref 7.35–7.45)
pH, Arterial: 7.276 — ABNORMAL LOW (ref 7.35–7.45)
pO2, Arterial: 88 mmHg (ref 83–108)
pO2, Arterial: 131 mmHg — ABNORMAL HIGH (ref 83–108)

## 2024-06-28 LAB — CBC
HCT: 32.5 % — ABNORMAL LOW (ref 36.0–46.0)
Hemoglobin: 9.8 g/dL — ABNORMAL LOW (ref 12.0–15.0)
MCH: 29.2 pg (ref 26.0–34.0)
MCHC: 30.2 g/dL (ref 30.0–36.0)
MCV: 96.7 fL (ref 80.0–100.0)
Platelets: 307 K/uL (ref 150–400)
RBC: 3.36 MIL/uL — ABNORMAL LOW (ref 3.87–5.11)
RDW: 13.1 % (ref 11.5–15.5)
WBC: 13 K/uL — ABNORMAL HIGH (ref 4.0–10.5)
nRBC: 0.7 % — ABNORMAL HIGH (ref 0.0–0.2)

## 2024-06-28 LAB — COMPREHENSIVE METABOLIC PANEL WITH GFR
ALT: 141 U/L — ABNORMAL HIGH (ref 0–44)
AST: 75 U/L — ABNORMAL HIGH (ref 15–41)
Albumin: 2.8 g/dL — ABNORMAL LOW (ref 3.5–5.0)
Alkaline Phosphatase: 40 U/L (ref 38–126)
Anion gap: 13 (ref 5–15)
BUN: 35 mg/dL — ABNORMAL HIGH (ref 8–23)
CO2: 27 mmol/L (ref 22–32)
Calcium: 9.1 mg/dL (ref 8.9–10.3)
Chloride: 100 mmol/L (ref 98–111)
Creatinine, Ser: 1.13 mg/dL — ABNORMAL HIGH (ref 0.44–1.00)
GFR, Estimated: 51 mL/min — ABNORMAL LOW (ref >60)
Glucose, Bld: 252 mg/dL — ABNORMAL HIGH (ref 70–99)
Potassium: 5 mmol/L (ref 3.5–5.1)
Sodium: 140 mmol/L (ref 135–145)
Total Bilirubin: 0.2 mg/dL (ref 0.0–1.2)
Total Protein: 6.3 g/dL — ABNORMAL LOW (ref 6.5–8.1)

## 2024-06-28 LAB — CULTURE, BLOOD (ROUTINE X 2): Special Requests: ADEQUATE

## 2024-06-28 LAB — GLUCOSE, CAPILLARY
Glucose-Capillary: 309 mg/dL — ABNORMAL HIGH (ref 70–99)
Glucose-Capillary: 273 mg/dL — ABNORMAL HIGH (ref 70–99)
Glucose-Capillary: 213 mg/dL — ABNORMAL HIGH (ref 70–99)
Glucose-Capillary: 124 mg/dL — ABNORMAL HIGH (ref 70–99)
Glucose-Capillary: 91 mg/dL (ref 70–99)
Glucose-Capillary: 107 mg/dL — ABNORMAL HIGH (ref 70–99)

## 2024-06-28 LAB — MAGNESIUM: Magnesium: 2.4 mg/dL (ref 1.7–2.4)

## 2024-06-28 LAB — PHOSPHORUS: Phosphorus: 3.5 mg/dL (ref 2.5–4.6)

## 2024-06-28 MED ORDER — METOPROLOL TARTRATE 5 MG/5ML IV SOLN
5.0000 mg | Freq: Once | INTRAVENOUS | Status: AC
Start: 2024-06-28 — End: 2024-06-28
  Administered 2024-06-28: 5 mg via INTRAVENOUS
  Filled 2024-06-28: qty 5

## 2024-06-28 MED ORDER — AMIODARONE IV BOLUS ONLY 150 MG/100ML
INTRAVENOUS | Status: AC
  Administered 2024-06-28: 150 mg via INTRAVENOUS
  Filled 2024-06-28: qty 100

## 2024-06-28 MED ORDER — FUROSEMIDE 10 MG/ML IJ SOLN
40.0000 mg | Freq: Once | INTRAMUSCULAR | Status: AC
  Administered 2024-06-28: 40 mg via INTRAVENOUS
  Filled 2024-06-28: qty 4

## 2024-06-28 MED ORDER — AMIODARONE IV BOLUS ONLY 150 MG/100ML: 150.0000 mg | Freq: Once | INTRAVENOUS | Status: AC

## 2024-06-28 MED ORDER — IPRATROPIUM-ALBUTEROL 0.5-2.5 (3) MG/3ML IN SOLN
RESPIRATORY_TRACT | Status: AC
  Filled 2024-06-28: qty 3

## 2024-06-28 MED ORDER — INSULIN GLARGINE-YFGN 100 UNIT/ML ~~LOC~~ SOLN
10.0000 [IU] | Freq: Every day | SUBCUTANEOUS | Status: DC
  Administered 2024-06-28 – 2024-06-29 (×2): 10 [IU] via SUBCUTANEOUS
  Filled 2024-06-28 (×2): qty 0.1

## 2024-06-28 MED ORDER — FENTANYL 2500MCG IN NS 250ML (10MCG/ML) PREMIX INFUSION
0.0000 ug/h | INTRAVENOUS | Status: DC
  Administered 2024-06-28: 50 ug/h via INTRAVENOUS
  Filled 2024-06-28: qty 250

## 2024-06-28 MED ORDER — PREDNISONE 20 MG PO TABS
20.0000 mg | ORAL_TABLET | Freq: Every day | ORAL | Status: AC
  Administered 2024-07-03 – 2024-07-04 (×2): 20 mg
  Filled 2024-06-28 (×2): qty 1

## 2024-06-28 MED ORDER — PREDNISONE 10 MG PO TABS
10.0000 mg | ORAL_TABLET | Freq: Every day | ORAL | Status: AC
  Administered 2024-07-05: 10 mg
  Filled 2024-06-28: qty 1

## 2024-06-28 MED ORDER — PREDNISONE 20 MG PO TABS
30.0000 mg | ORAL_TABLET | Freq: Every day | ORAL | Status: AC
  Administered 2024-07-01 – 2024-07-02 (×2): 30 mg
  Filled 2024-06-28 (×2): qty 1

## 2024-06-28 MED ORDER — ALTEPLASE 2 MG IJ SOLR
2.0000 mg | Freq: Once | INTRAMUSCULAR | Status: AC
  Administered 2024-06-28: 2 mg
  Filled 2024-06-28: qty 2

## 2024-06-28 MED ORDER — MIDAZOLAM HCL (PF) 2 MG/2ML IJ SOLN
2.0000 mg | INTRAMUSCULAR | Status: DC | PRN
  Administered 2024-06-28 (×2): 2 mg via INTRAVENOUS
  Filled 2024-06-28 (×2): qty 2

## 2024-06-28 MED ORDER — ACETAMINOPHEN 10 MG/ML IV SOLN
1000.0000 mg | Freq: Four times a day (QID) | INTRAVENOUS | Status: AC
  Administered 2024-06-28 – 2024-06-29 (×4): 1000 mg via INTRAVENOUS
  Filled 2024-06-28 (×4): qty 100

## 2024-06-28 MED ORDER — IPRATROPIUM-ALBUTEROL 0.5-2.5 (3) MG/3ML IN SOLN
3.0000 mL | RESPIRATORY_TRACT | Status: DC | PRN
  Administered 2024-06-28 – 2024-07-03 (×4): 3 mL via RESPIRATORY_TRACT
  Filled 2024-06-28 (×5): qty 3

## 2024-06-28 MED ORDER — PREDNISONE 20 MG PO TABS
40.0000 mg | ORAL_TABLET | Freq: Every day | ORAL | Status: AC
Start: 2024-06-29 — End: 2024-07-01
  Administered 2024-06-30: 40 mg
  Filled 2024-06-28: qty 2

## 2024-06-28 NOTE — Progress Notes (Signed)
 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 Code stroke called. On arrival to ED patient emergently intubated for acute respiratory distress and airway protection. CT Head with no ICH or large infarct, CTA head and neck with no LVO. CXR concerning for a Pneumonia with visible BL lung apices on CTA demonstrating infection/inflammation. Code stroke cancelled with presentation suspicious for sepsis. Patient received 30ml/kg crystalloid, pan cultured, and started empirically on Cefepime/Vanc/Azithromycin . Of note, family reports patient has recently not felt well and likely has had a cold. PCCM consulted for ICU admission.   On exam in ED, patient febrile to >101 and hypotensive, likely multifactorial in setting of presumed sepsis and recent sedation on induction with underlying heart disease. Norepinephrine gtt was initiated for MAPs < 65 and patient was transferred to ICU with plans to gain central access for vasopressor administration and arterial line placement for hemodynamic monitoring.   Family at bedside and indicated Full Code status. All questions were addressed at that time.  Pertinent  Medical History  As noted above   Significant Hospital Events: Including procedures, antibiotic start and stop dates in addition to other pertinent events   Found unresponsive at home->ED>Code stroke>intubated for ARD/airway protection>CTH/CTA negative>Code stroke cancelled>Hypotensive>started on pressors>Admit to ICU  Arterial line  placed 10/21 10/22 and 10/23 unsuccessful extubation 10/24: Despite maximal Precedex and fentanyl  pushes, unable to safely and comfortably clean the patient up so added prn Versed and started fentanyl  infusion. Rectal tube added for diarrhea.  10/24: 2/3 BC resulted in staph capitis/epidermitis  Interim History / Subjective:  Increased agitation last night per above. More alert today.   Objective    Blood pressure 114/68, pulse 85, temperature 98.4 F (36.9 C), resp. rate 18, height 5' 1 (1.549 m), weight 76 kg, SpO2 100%. CVP:  [9 mmHg-55 mmHg] 12 mmHg  Vent Mode: PRVC FiO2 (%):  [40 %] 40 % Set Rate:  [16 bmp] 16 bmp Vt Set:  [420 mL] 420 mL PEEP:  [5 cmH20] 5 cmH20 Pressure Support:  [5 cmH20-8 cmH20] 8 cmH20 Plateau Pressure:  [21 cmH20-26 cmH20] 23 cmH20   Intake/Output Summary (Last 24 hours) at 06/28/2024 0734 Last data filed at 06/28/2024 0700 Gross per 24 hour  Intake 1888.54 ml  Output 500 ml  Net 1388.54 ml   Filed Weights   06/25/24 1040 06/27/24 0500 06/28/24 0500  Weight: 71 kg 74 kg 76 kg    Examination: General: Chronically-ill elderly woman lying in bed, NAD, sedated Lungs: Coarse lung sounds bilaterally. Ventilator assisted breaths, wheezing improved from yesterday Cardiovascular: tachycardic, regular, no m/r/g Extremities: Warm, dry  Resolved problem list   Assessment and Plan  Acute on chronic hypoxic/hypercarbic respiratory failure secondary to presumed pneumonia/sepsis, currently intubated with mechanically ventilation COPD on 5-6L [place] baseline Hx SCC left lower lobe s/p radiation in September 2025 10/21 DG chest: emphysema plus patchy and irregular bilateral interstitial thickening and distal airway patchy opacity suggesting superimposed acute pulmonary infection. Metapneumovirus positive. Procalc 2.69.  Strep pneuomo/Legionella UA negative. Will continue Rocephin  2 g q  24 given procalc (day 5/5, Zithromax  3 day course complete). -Unsuccessful  weaning trial 10/22 and 10/23, will attempt again today. Precedex off for now. Continue Tube feeds. -Continue lung protective ventilation -Continue duonebs, Brovana , Pulmicort , Yupelri -Precedex -Fentanyl  prn -Methylprednisolone  40 mg q12 > q 24 with Prednisone  taper planned to start tomorrow -Start Semglee 10 daily in addition to moderate SSI given hyperglycemia -ABG PRN -Trend labs  HFpEF with Grade I DD (EF 50-55% 05/2023) Net positive 6.6 L since admission. Takes Lasix  40 mg daily at home. Will give IV Lasix  40 mg once today.  -Trend BMP, I/O's   Positive Blood Cultures (Staph epidermitis and capitis) 10/21 2/4 BC from same site (likely contaminant).  MRSA swab negative. Repeat BC NG < 24 hours. -Last day of Rocephin , trend BC's Labs   CBC: Recent Labs  Lab 06/25/24 0922 06/25/24 0927 06/25/24 1016 06/25/24 1658 06/25/24 1830 06/26/24 0447 06/27/24 0451  WBC 9.5  --   --   --   --  8.0 9.8  NEUTROABS 8.6*  --   --   --   --   --   --   HGB 10.8*   < > 10.5* 14.3 9.9* 9.2* 9.2*  HCT 38.4   < > 31.0* 42.0 29.0* 30.4* 30.4*  MCV 101.9*  --   --   --   --  95.9 95.9  PLT 295  --   --   --   --  202 220   < > = values in this interval not displayed.    Basic Metabolic Panel: Recent Labs  Lab 06/25/24 0922 06/25/24 0927 06/25/24 1016 06/25/24 1658 06/25/24 1824 06/25/24 1830 06/26/24 0447 06/27/24 0451 06/27/24 1503 06/28/24 0659  NA 139 137 139 136  --  135 135 139  --   --   K 5.4* 5.2* 4.7 4.8  --  4.8 4.7 4.8  --   --   CL 93* 94*  --   --   --   --  102 102  --   --   CO2 36*  --   --   --   --   --  27 28  --   --   GLUCOSE 181* 184*  --   --   --   --  175* 143*  --   --   BUN 16 20  --   --   --   --  23 25*  --   --   CREATININE 1.12* 1.10*  --   --   --   --  0.98 1.01*  --   --   CALCIUM  9.1  --   --   --   --   --  8.1* 8.2*  --   --   MG  --   --   --   --  1.8  --  2.1  --  2.3 2.4  PHOS  --   --   --   --  2.7  --  2.8  --  3.7 3.5    GFR: Estimated Creatinine Clearance: 44.9 mL/min (A) (by C-G formula based on SCr of 1.01 mg/dL (H)). Recent Labs  Lab 06/25/24 0922 06/25/24 0927 06/26/24 0445 06/26/24 0447 06/27/24 0451  PROCALCITON  --   --  2.69  --   --   WBC 9.5  --   --  8.0 9.8  LATICACIDVEN  --  1.0  --   --   --  Liver Function Tests: Recent Labs  Lab 06/25/24 0922 06/27/24 0451  AST 24 91*  ALT 11 107*  ALKPHOS 39 37*  BILITOT 0.6 0.2  PROT 7.0 5.7*  ALBUMIN 3.3* 2.5*   No results for input(s): LIPASE, AMYLASE in the last 168 hours. No results for input(s): AMMONIA in the last 168 hours.  ABG    Component Value Date/Time   PHART 7.4 06/26/2024 0445   PCO2ART 49 (H) 06/26/2024 0445   PO2ART 184 (H) 06/26/2024 0445   HCO3 30.4 (H) 06/26/2024 0445   TCO2 31 06/25/2024 1830   O2SAT 98.3 06/26/2024 0445     Coagulation Profile: Recent Labs  Lab 06/25/24 0922  INR 0.9    Cardiac Enzymes: No results for input(s): CKTOTAL, CKMB, CKMBINDEX, TROPONINI in the last 168 hours.  HbA1C: Hgb A1c MFr Bld  Date/Time Value Ref Range Status  06/26/2024 04:47 AM 5.6 4.8 - 5.6 % Final    Comment:    (NOTE) Diagnosis of Diabetes The following HbA1c ranges recommended by the American Diabetes Association (ADA) may be used as an aid in the diagnosis of diabetes mellitus.  Hemoglobin             Suggested A1C NGSP%              Diagnosis  <5.7                   Non Diabetic  5.7-6.4                Pre-Diabetic  >6.4                   Diabetic  <7.0                   Glycemic control for                       adults with diabetes.    09/17/2013 01:57 PM 6.1 (H) <5.7 % Final    Comment:    (NOTE)                                                                       According to the ADA Clinical Practice Recommendations for 2011, when HbA1c is used as a screening test:  >=6.5%   Diagnostic of Diabetes Mellitus           (if abnormal result is confirmed) 5.7-6.4%    Increased risk of developing Diabetes Mellitus References:Diagnosis and Classification of Diabetes Mellitus,Diabetes Care,2011,34(Suppl 1):S62-S69 and Standards of Medical Care in         Diabetes - 2011,Diabetes Care,2011,34 (Suppl 1):S11-S61.    CBG: Recent Labs  Lab 06/27/24 1527 06/27/24 1932 06/27/24 2332 06/28/24 0331 06/28/24 0712  GLUCAP 149* 167* 208* 309* 273*    Review of Systems:     Past Medical History:  She,  has a past medical history of Abnormal finding on EKG (09/19/2013), Acute on chronic respiratory failure with hypercapnia (HCC), Acute respiratory failure (HCC) (06/08/2016), Anxiety (11/09/2020), Arthritis, Asthma, Carotid bruit (11/09/2020), Chronic diastolic CHF (congestive heart failure) (HCC) (07/28/2017), Chronic obstructive pulmonary disease, unspecified (HCC) (11/09/2020), COPD, group D, by GOLD 2017 classification (HCC) (09/17/2013), Cough (11/09/2020), Decreased  estrogen level (11/09/2020), Diabetes (HCC), Dyspnea, Edema (11/09/2020), Essential hypertension (09/18/2013), Goals of care, counseling/discussion, Hardening of the aorta (main artery of the heart) (11/09/2020), Heart disease, History of radiation therapy, Hypertension, Hypertensive heart failure (HCC) (11/09/2020), Hypoxia (11/09/2020), Insomnia (11/09/2020), Iron deficiency anemia (11/09/2020), Large liver (11/09/2020), Near syncope (09/17/2013), Osteoporosis (11/09/2020), Palliative care encounter, Prediabetes (09/19/2013), Pure hypercholesterolemia (11/09/2020), Raynaud's disease (11/09/2020), Sinus tachycardia (09/18/2013), Skin sensation disturbance (11/09/2020), Smoker (09/17/2013), 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   Surgical History:   Past Surgical History:  Procedure Laterality Date   BRONCHIAL BIOPSY  05/25/2023   Procedure: BRONCHIAL BIOPSIES;  Surgeon: Brenna Adine CROME, DO;   Location: [facility];  Service: Pulmonary;;   BRONCHIAL NEEDLE ASPIRATION BIOPSY  05/25/2023   Procedure: BRONCHIAL NEEDLE ASPIRATION BIOPSIES;  Surgeon: Brenna Adine CROME, DO;  Location: [facility];  Service: Pulmonary;;   FIDUCIAL MARKER PLACEMENT  05/25/2023   Procedure: FIDUCIAL MARKER PLACEMENT;  Surgeon: Brenna Adine CROME, DO;  Location: [facility];  Service: Pulmonary;;   KNEE SURGERY Left      Social History:   reports that she quit smoking about 6 years ago. Her smoking use included cigarettes. She started smoking about 6 years ago. She has a 55 pack-year smoking history. She has never used smokeless tobacco. She reports that she does not drink alcohol and does not use drugs.   Family History:  Her family history includes Hyperlipidemia in her father and mother; Hypertension in her father and mother.   Allergies Allergies  Allergen Reactions   Ace Inhibitors Swelling   Diphenhydramine Other (See Comments)    Hyperactive/jittery    Doxycycline Hyclate Nausea And Vomiting   Codeine Nausea And Vomiting     Home Medications  Prior to Admission medications   Medication Sig Start Date End Date Taking? Authorizing Provider  acetaminophen  (TYLENOL ) 500 MG tablet Take 1,000 mg by mouth every 6 (six) hours as needed for moderate pain.   Yes [provider]  albuterol  (PROVENTIL  HFA;VENTOLIN  HFA) 108 (90 BASE) MCG/ACT inhaler Inhale 2 puffs into the lungs every 6 (six) hours as needed for wheezing or shortness of breath. 09/19/13  Yes Johnson, Clanford L, MD  aspirin  81 MG chewable tablet Chew 1 tablet (81 mg total) by mouth daily. 09/19/13  Yes Johnson, Clanford L, MD  atorvastatin  (LIPITOR) 10 MG tablet TAKE 1 TABLET BY MOUTH EVERY DAY 07/21/23  Yes Krasowski, Robert J, MD  benzonatate  (TESSALON ) 100 MG capsule Take 100 mg by mouth 3 (three) times daily as needed for cough.    Yes [provider]  Biotin 10 MG CAPS Take 10 mg by mouth daily.   Yes [provider]  budesonide -formoterol  (SYMBICORT ) 160-4.5 MCG/ACT inhaler Inhale 2 puffs into the lungs 2 (two) times daily. 08/23/19  Yes Mannam, Praveen, MD  calcium  carbonate (OSCAL) 1500 (600 Ca) MG TABS tablet Take 600 mg of elemental calcium  by mouth daily.   Yes [provider]  Cholecalciferol (VITAMIN D ) 50 MCG (2000 UT) tablet Take 2,000 Units by mouth daily.   Yes [provider]  diphenhydramine-acetaminophen  (TYLENOL  PM) 25-500 MG TABS tablet Take 1 tablet by mouth at bedtime.   Yes [provider]  furosemide  (LASIX ) 40 MG tablet Take 40 mg by mouth daily.   Yes [provider]  gabapentin (NEURONTIN) 300 MG capsule Take 900 mg by mouth at bedtime.   Yes [provider]  iron polysaccharides (NIFEREX) 150  MG capsule Take 150 mg by mouth daily. 11/05/20  Yes [provider]  loratadine (CLARITIN) 10 MG tablet Take 10 mg by mouth daily as needed for allergies.   Yes [provider]  LORazepam  (ATIVAN ) 1 MG tablet Take 1 tablet (1 mg total) by mouth every 6 (six) hours as needed for anxiety. 08/07/17  Yes Drusilla Sabas RAMAN, MD  losartan  (COZAAR ) 50 MG tablet Take 50 mg by mouth daily.   Yes [provider]  Multiple Vitamin (MULTIVITAMIN WITH MINERALS) TABS tablet Take 1 tablet by mouth daily.   Yes [provider]  raloxifene  (EVISTA ) 60 MG tablet Take 60 mg by mouth every morning. 08/07/20  Yes [provider]  Tiotropium Bromide  Monohydrate (SPIRIVA  RESPIMAT) 2.5 MCG/ACT AERS Inhale 1 puff into the lungs daily.   Yes [provider]  traZODone  (DESYREL ) 100 MG tablet Take 1 tablet (100 mg total) by mouth at bedtime as needed for sleep. 08/07/17  Yes Drusilla Sabas RAMAN, MD     Norman Lobstein, DO

## 2024-06-28 NOTE — Progress Notes (Addendum)
 eLink Physician-Brief Progress Note Patient Name: Katie Waller DOB: September 04, 1946 MRN: [id number]   Date of Service  06/28/2024  HPI/Events of Note  Despite maximal Precedex and fentanyl  pushes, unable to safely and comfortably clean the patient up  eICU Interventions  Add as needed Versed to help maintain RASS goal   0542 - add rectal tube  0613 -unable to maintain RASS and CPAP scores with as needed Versed/as needed fentanyl .  Initiate fentanyl  infusion  Intervention Category Minor Interventions: Agitation / anxiety - evaluation and management  Scout Gumbs 06/28/2024, 4:47 AM

## 2024-06-28 NOTE — Inpatient Diabetes Management (Signed)
 Inpatient Diabetes Program Recommendations  AACE/ADA: New Consensus Statement on Inpatient Glycemic Control   Target Ranges:  Prepandial:   less than 140 mg/dL      Peak postprandial:   less than 180 mg/dL (1-2 hours)      Critically ill patients:  140 - 180 mg/dL    Latest Reference Range & Units 06/27/24 07:33 06/27/24 11:23 06/27/24 15:27 06/27/24 19:32 06/27/24 23:32 06/28/24 03:31 06/28/24 07:12  Glucose-Capillary 70 - 99 mg/dL 887 (H) 821 (H) 850 (H) 167 (H) 208 (H) 309 (H) 273 (H)   Review of Glycemic Control  Diabetes history: DM2 Outpatient Diabetes medications: None Current orders for Inpatient glycemic control: Novolog 0-15 units Q4H; Solumedrol 40 mg Q12H, Osmolite @ 50 ml/hr  Inpatient Diabetes Program Recommendations:    Insulin: If steroids are continued as ordered, please consider ordering Semglee 8 units Q24H and Novolog 4 units Q4H for tube feeding coverage. If tube feeding is stopped or held then Novolog tube feeding coverage should also be stopped or held.  Thanks, Earnie Gainer, RN, MSN, CDCES Diabetes Coordinator Inpatient Diabetes Program [phone] (Team Pager from 8am to 5pm)

## 2024-06-28 NOTE — Plan of Care (Signed)
  Problem: Nutrition: Goal: Adequate nutrition will be maintained Outcome: Progressing   Problem: Elimination: Goal: Will not experience complications related to urinary retention Outcome: Progressing   Problem: Education: Goal: Knowledge of General Education information will improve Description: Including pain rating scale, medication(s)/side effects and non-pharmacologic comfort measures Outcome: Not Progressing   Problem: Clinical Measurements: Goal: Ability to maintain clinical measurements within normal limits will improve Outcome: Not Progressing Goal: Diagnostic test results will improve Outcome: Not Progressing   Problem: Activity: Goal: Risk for activity intolerance will decrease Outcome: Not Progressing   Problem: Coping: Goal: Level of anxiety will decrease Outcome: Not Progressing   Problem: Pain Managment: Goal: General experience of comfort will improve and/or be controlled Outcome: Not Progressing   Problem: Safety: Goal: Ability to remain free from injury will improve Outcome: Not Progressing   Problem: Skin Integrity: Goal: Risk for impaired skin integrity will decrease Outcome: Not Progressing   Problem: Fluid Volume: Goal: Ability to maintain a balanced intake and output will improve Outcome: Not Progressing   Problem: Tissue Perfusion: Goal: Adequacy of tissue perfusion will improve Outcome: Not Progressing   Problem: Fluid Volume: Goal: Hemodynamic stability will improve Outcome: Not Progressing   Problem: Clinical Measurements: Goal: Signs and symptoms of infection will decrease Outcome: Not Progressing   Problem: Respiratory: Goal: Ability to maintain adequate ventilation will improve Outcome: Not Progressing

## 2024-06-28 NOTE — TOC Progression Note (Signed)
 Transition of Care [facility]) - Progression Note    Patient Details  Name: Katie Waller MRN: [id number] Date of Birth: Jul 30, 1946  Transition of [facility] CM/SW Contact  Lauraine FORBES Saa, LCSWA Phone Number: 06/28/2024, 2:48 PM  Clinical Narrative:     2:48 PM Per progressions, patient was recently extubated.  Expected Discharge Plan: Home/Self Care Barriers to Discharge: Continued Medical Work up               Expected Discharge Plan and Services       Living arrangements for the past 2 months: Single Family Home                                       Social Drivers of Health (SDOH) Interventions SDOH Screenings   Food Insecurity: No Food Insecurity (06/05/2023)  Housing: Low Risk  (06/05/2023)  Transportation Needs: No Transportation Needs (06/05/2023)  Utilities: Not At Risk (06/05/2023)  Depression (PHQ2-9): Low Risk  (06/05/2023)  Tobacco Use: Medium Risk (06/25/2024)    Readmission Risk Interventions     No data to display

## 2024-06-28 NOTE — Plan of Care (Signed)
  Problem: Nutrition: Goal: Adequate nutrition will be maintained 06/28/2024 0707 by Vertie Thedora MATSU, RN Outcome: Progressing 06/28/2024 0701 by Vertie Thedora MATSU, RN Outcome: Progressing   Problem: Education: Goal: Knowledge of General Education information will improve Description: Including pain rating scale, medication(s)/side effects and non-pharmacologic comfort measures 06/28/2024 0707 by Vertie Thedora MATSU, RN Outcome: Not Progressing 06/28/2024 0701 by Vertie Thedora MATSU, RN Outcome: Not Progressing   Problem: Health Behavior/Discharge Planning: Goal: Ability to manage health-related needs will improve Outcome: Not Progressing   Problem: Clinical Measurements: Goal: Ability to maintain clinical measurements within normal limits will improve 06/28/2024 0707 by Vertie Thedora MATSU, RN Outcome: Not Progressing 06/28/2024 0701 by Vertie Thedora MATSU, RN Outcome: Not Progressing Goal: Diagnostic test results will improve 06/28/2024 0707 by Vertie Thedora MATSU, RN Outcome: Not Progressing 06/28/2024 0701 by Vertie Thedora MATSU, RN Outcome: Not Progressing Goal: Respiratory complications will improve Outcome: Not Progressing Goal: Cardiovascular complication will be avoided Outcome: Not Progressing   Problem: Activity: Goal: Risk for activity intolerance will decrease 06/28/2024 0707 by Vertie Thedora MATSU, RN Outcome: Not Progressing 06/28/2024 0701 by Vertie Thedora MATSU, RN Outcome: Not Progressing   Problem: Coping: Goal: Level of anxiety will decrease 06/28/2024 0707 by Vertie Thedora MATSU, RN Outcome: Not Progressing 06/28/2024 0701 by Vertie Thedora MATSU, RN Outcome: Not Progressing   Problem: Elimination: Goal: Will not experience complications related to bowel motility Outcome: Not Progressing Goal: Will not experience complications related to urinary retention 06/28/2024 0707 by Vertie Thedora MATSU, RN Outcome: Not Progressing 06/28/2024 0701 by Vertie Thedora MATSU, RN Outcome: Progressing    Problem: Pain Managment: Goal: General experience of comfort will improve and/or be controlled 06/28/2024 0707 by Vertie Thedora MATSU, RN Outcome: Not Progressing 06/28/2024 0701 by Vertie Thedora MATSU, RN Outcome: Not Progressing   Problem: Safety: Goal: Ability to remain free from injury will improve 06/28/2024 0707 by Vertie Thedora MATSU, RN Outcome: Not Progressing 06/28/2024 0701 by Vertie Thedora MATSU, RN Outcome: Not Progressing   Problem: Skin Integrity: Goal: Risk for impaired skin integrity will decrease 06/28/2024 0707 by Vertie Thedora MATSU, RN Outcome: Not Progressing 06/28/2024 0701 by Vertie Thedora MATSU, RN Outcome: Not Progressing   Problem: Fluid Volume: Goal: Ability to maintain a balanced intake and output will improve 06/28/2024 0707 by Vertie Thedora MATSU, RN Outcome: Not Progressing 06/28/2024 0701 by Vertie Thedora MATSU, RN Outcome: Not Progressing   Problem: Tissue Perfusion: Goal: Adequacy of tissue perfusion will improve 06/28/2024 0707 by Vertie Thedora MATSU, RN Outcome: Not Progressing 06/28/2024 0701 by Vertie Thedora MATSU, RN Outcome: Not Progressing   Problem: Fluid Volume: Goal: Hemodynamic stability will improve Outcome: Not Progressing   Problem: Clinical Measurements: Goal: Signs and symptoms of infection will decrease Outcome: Not Progressing   Problem: Respiratory: Goal: Ability to maintain adequate ventilation will improve 06/28/2024 0707 by Vertie Thedora MATSU, RN Outcome: Not Progressing 06/28/2024 0701 by Vertie Thedora MATSU, RN Outcome: Not Progressing

## 2024-06-28 NOTE — Procedures (Signed)
 Extubation Procedure Note  Patient Details:   Name: Katie Waller DOB: 03/29/1946 MRN: [id number]   Airway Documentation:    Vent end date: 06/28/24 Vent end time: 1125   Evaluation  O2 sats: stable throughout Complications: No apparent complications Patient did tolerate procedure well. Bilateral Breath Sounds: Diminished, Clear   Yes  Pt was extubated and placed on BIPAP 12/6 R12 50% per CCM MD. Cuff leak was noted prior to extubation and no stridor post. Pt has tachypnea with extubation, but is tolerating BIPAP ok at this time. Plan to wear BIPAP for a few hours post extubation per CCM MD. RT will monitor.   Laneta Dibbles 06/28/2024, 11:38 AM

## 2024-06-28 NOTE — Plan of Care (Signed)
 Problem: Fluid Volume: Goal: Ability to maintain a balanced intake and output will improve 06/28/2024 0715 by Vertie Thedora MATSU, RN Outcome: Progressing 06/28/2024 0707 by Vertie Thedora MATSU, RN Outcome: Not Progressing 06/28/2024 0701 by Vertie Thedora MATSU, RN Outcome: Not Progressing   Problem: Education: Goal: Knowledge of General Education information will improve Description: Including pain rating scale, medication(s)/side effects and non-pharmacologic comfort measures 06/28/2024 0715 by Vertie Thedora MATSU, RN Outcome: Not Progressing 06/28/2024 0707 by Vertie Thedora MATSU, RN Outcome: Not Progressing 06/28/2024 0701 by Vertie Thedora MATSU, RN Outcome: Not Progressing   Problem: Health Behavior/Discharge Planning: Goal: Ability to manage health-related needs will improve 06/28/2024 0715 by Vertie Thedora MATSU, RN Outcome: Not Progressing 06/28/2024 0707 by Vertie Thedora MATSU, RN Outcome: Not Progressing   Problem: Clinical Measurements: Goal: Ability to maintain clinical measurements within normal limits will improve 06/28/2024 0707 by Vertie Thedora MATSU, RN Outcome: Not Progressing 06/28/2024 0701 by Vertie Thedora MATSU, RN Outcome: Not Progressing Goal: Diagnostic test results will improve 06/28/2024 0707 by Vertie Thedora MATSU, RN Outcome: Not Progressing 06/28/2024 0701 by Vertie Thedora MATSU, RN Outcome: Not Progressing Goal: Respiratory complications will improve 06/28/2024 0715 by Vertie Thedora MATSU, RN Outcome: Not Progressing 06/28/2024 0707 by Vertie Thedora MATSU, RN Outcome: Not Progressing Goal: Cardiovascular complication will be avoided Outcome: Not Progressing   Problem: Activity: Goal: Risk for activity intolerance will decrease 06/28/2024 0715 by Vertie Thedora MATSU, RN Outcome: Not Progressing 06/28/2024 0707 by Vertie Thedora MATSU, RN Outcome: Not Progressing 06/28/2024 0701 by Vertie Thedora MATSU, RN Outcome: Not Progressing   Problem: Nutrition: Goal: Adequate nutrition will be  maintained 06/28/2024 0715 by Vertie Thedora MATSU, RN Outcome: Not Progressing 06/28/2024 0707 by Vertie Thedora MATSU, RN Outcome: Progressing 06/28/2024 0701 by Vertie Thedora MATSU, RN Outcome: Progressing   Problem: Coping: Goal: Level of anxiety will decrease 06/28/2024 0715 by Vertie Thedora MATSU, RN Outcome: Not Progressing 06/28/2024 0707 by Vertie Thedora MATSU, RN Outcome: Not Progressing 06/28/2024 0701 by Vertie Thedora MATSU, RN Outcome: Not Progressing   Problem: Elimination: Goal: Will not experience complications related to bowel motility 06/28/2024 0715 by Vertie Thedora MATSU, RN Outcome: Not Progressing 06/28/2024 0707 by Vertie Thedora MATSU, RN Outcome: Not Progressing Goal: Will not experience complications related to urinary retention 06/28/2024 0715 by Vertie Thedora MATSU, RN Outcome: Not Progressing 06/28/2024 0707 by Vertie Thedora MATSU, RN Outcome: Not Progressing 06/28/2024 0701 by Vertie Thedora MATSU, RN Outcome: Progressing   Problem: Pain Managment: Goal: General experience of comfort will improve and/or be controlled 06/28/2024 0715 by Vertie Thedora MATSU, RN Outcome: Not Progressing 06/28/2024 0707 by Vertie Thedora MATSU, RN Outcome: Not Progressing 06/28/2024 0701 by Vertie Thedora MATSU, RN Outcome: Not Progressing   Problem: Safety: Goal: Ability to remain free from injury will improve 06/28/2024 0715 by Vertie Thedora MATSU, RN Outcome: Not Progressing 06/28/2024 0707 by Vertie Thedora MATSU, RN Outcome: Not Progressing 06/28/2024 0701 by Vertie Thedora MATSU, RN Outcome: Not Progressing   Problem: Skin Integrity: Goal: Risk for impaired skin integrity will decrease 06/28/2024 0707 by Vertie Thedora MATSU, RN Outcome: Not Progressing 06/28/2024 0701 by Vertie Thedora MATSU, RN Outcome: Not Progressing   Problem: Metabolic: Goal: Ability to maintain appropriate glucose levels will improve Outcome: Not Progressing   Problem: Nutritional: Goal: Progress toward achieving an optimal weight will  improve Outcome: Not Progressing   Problem: Skin Integrity: Goal: Risk for impaired skin integrity will decrease Outcome: Not Progressing   Problem: Tissue Perfusion: Goal: Adequacy of tissue perfusion will improve 06/28/2024  [id number] by Vertie Thedora MATSU, RN Outcome: Not Progressing 06/28/2024 0701 by Vertie Thedora MATSU, RN Outcome: Not Progressing   Problem: Fluid Volume: Goal: Hemodynamic stability will improve Outcome: Not Progressing   Problem: Clinical Measurements: Goal: Signs and symptoms of infection will decrease 06/28/2024 0715 by Vertie Thedora MATSU, RN Outcome: Not Progressing 06/28/2024 0701 by Vertie Thedora MATSU, RN Outcome: Not Progressing   Problem: Respiratory: Goal: Ability to maintain adequate ventilation will improve 06/28/2024 0715 by Vertie Thedora MATSU, RN Outcome: Not Progressing 06/28/2024 0707 by Vertie Thedora MATSU, RN Outcome: Not Progressing 06/28/2024 0701 by Vertie Thedora MATSU, RN Outcome: Not Progressing

## 2024-06-29 ENCOUNTER — Inpatient Hospital Stay (HOSPITAL_COMMUNITY)

## 2024-06-29 DIAGNOSIS — B9781 Human metapneumovirus as the cause of diseases classified elsewhere: Secondary | ICD-10-CM | POA: Diagnosis not present

## 2024-06-29 DIAGNOSIS — J90 Pleural effusion, not elsewhere classified: Secondary | ICD-10-CM | POA: Diagnosis not present

## 2024-06-29 DIAGNOSIS — R6521 Severe sepsis with septic shock: Secondary | ICD-10-CM | POA: Diagnosis not present

## 2024-06-29 DIAGNOSIS — J811 Chronic pulmonary edema: Secondary | ICD-10-CM | POA: Diagnosis not present

## 2024-06-29 DIAGNOSIS — J439 Emphysema, unspecified: Secondary | ICD-10-CM | POA: Diagnosis not present

## 2024-06-29 DIAGNOSIS — A419 Sepsis, unspecified organism: Secondary | ICD-10-CM | POA: Diagnosis not present

## 2024-06-29 DIAGNOSIS — J9622 Acute and chronic respiratory failure with hypercapnia: Secondary | ICD-10-CM | POA: Diagnosis not present

## 2024-06-29 DIAGNOSIS — R918 Other nonspecific abnormal finding of lung field: Secondary | ICD-10-CM | POA: Diagnosis not present

## 2024-06-29 DIAGNOSIS — J9621 Acute and chronic respiratory failure with hypoxia: Secondary | ICD-10-CM | POA: Diagnosis not present

## 2024-06-29 DIAGNOSIS — Z4682 Encounter for fitting and adjustment of non-vascular catheter: Secondary | ICD-10-CM | POA: Diagnosis not present

## 2024-06-29 LAB — GLUCOSE, CAPILLARY
Glucose-Capillary: 111 mg/dL — ABNORMAL HIGH (ref 70–99)
Glucose-Capillary: 112 mg/dL — ABNORMAL HIGH (ref 70–99)
Glucose-Capillary: 113 mg/dL — ABNORMAL HIGH (ref 70–99)
Glucose-Capillary: 157 mg/dL — ABNORMAL HIGH (ref 70–99)
Glucose-Capillary: 180 mg/dL — ABNORMAL HIGH (ref 70–99)
Glucose-Capillary: 199 mg/dL — ABNORMAL HIGH (ref 70–99)

## 2024-06-29 LAB — POCT I-STAT 7, (LYTES, BLD GAS, ICA,H+H)
Acid-Base Excess: 5 mmol/L — ABNORMAL HIGH (ref 0.0–2.0)
Acid-Base Excess: 2 mmol/L (ref 0.0–2.0)
Bicarbonate: 30.9 mmol/L — ABNORMAL HIGH (ref 20.0–28.0)
Bicarbonate: 25.2 mmol/L (ref 20.0–28.0)
Calcium, Ion: 1.31 mmol/L (ref 1.15–1.40)
Calcium, Ion: 1.2 mmol/L (ref 1.15–1.40)
HCT: 27 % — ABNORMAL LOW (ref 36.0–46.0)
HCT: 28 % — ABNORMAL LOW (ref 36.0–46.0)
Hemoglobin: 9.2 g/dL — ABNORMAL LOW (ref 12.0–15.0)
Hemoglobin: 9.5 g/dL — ABNORMAL LOW (ref 12.0–15.0)
O2 Saturation: 99 %
O2 Saturation: 100 %
Patient temperature: 37.2
Patient temperature: 38.4
Potassium: 4.6 mmol/L (ref 3.5–5.1)
Potassium: 4.5 mmol/L (ref 3.5–5.1)
Sodium: 141 mmol/L (ref 135–145)
Sodium: 140 mmol/L (ref 135–145)
TCO2: 32 mmol/L (ref 22–32)
TCO2: 26 mmol/L (ref 22–32)
pCO2 arterial: 54.4 mmHg — ABNORMAL HIGH (ref 32–48)
pCO2 arterial: 34.9 mmHg (ref 32–48)
pH, Arterial: 7.363 (ref 7.35–7.45)
pH, Arterial: 7.472 — ABNORMAL HIGH (ref 7.35–7.45)
pO2, Arterial: 140 mmHg — ABNORMAL HIGH (ref 83–108)
pO2, Arterial: 529 mmHg — ABNORMAL HIGH (ref 83–108)

## 2024-06-29 LAB — COMPREHENSIVE METABOLIC PANEL WITH GFR
ALT: 128 U/L — ABNORMAL HIGH (ref 0–44)
AST: 59 U/L — ABNORMAL HIGH (ref 15–41)
Albumin: 2.6 g/dL — ABNORMAL LOW (ref 3.5–5.0)
Alkaline Phosphatase: 40 U/L (ref 38–126)
Anion gap: 9 (ref 5–15)
BUN: 39 mg/dL — ABNORMAL HIGH (ref 8–23)
CO2: 29 mmol/L (ref 22–32)
Calcium: 9 mg/dL (ref 8.9–10.3)
Chloride: 102 mmol/L (ref 98–111)
Creatinine, Ser: 1.18 mg/dL — ABNORMAL HIGH (ref 0.44–1.00)
GFR, Estimated: 48 mL/min — ABNORMAL LOW (ref >60)
Glucose, Bld: 135 mg/dL — ABNORMAL HIGH (ref 70–99)
Potassium: 4.6 mmol/L (ref 3.5–5.1)
Sodium: 140 mmol/L (ref 135–145)
Total Bilirubin: 0.4 mg/dL (ref 0.0–1.2)
Total Protein: 5.4 g/dL — ABNORMAL LOW (ref 6.5–8.1)

## 2024-06-29 LAB — CBC
HCT: 27.8 % — ABNORMAL LOW (ref 36.0–46.0)
Hemoglobin: 8.5 g/dL — ABNORMAL LOW (ref 12.0–15.0)
MCH: 29.4 pg (ref 26.0–34.0)
MCHC: 30.6 g/dL (ref 30.0–36.0)
MCV: 96.2 fL (ref 80.0–100.0)
Platelets: 259 K/uL (ref 150–400)
RBC: 2.89 MIL/uL — ABNORMAL LOW (ref 3.87–5.11)
RDW: 13.1 % (ref 11.5–15.5)
WBC: 9.1 K/uL (ref 4.0–10.5)
nRBC: 1.1 % — ABNORMAL HIGH (ref 0.0–0.2)

## 2024-06-29 LAB — MAGNESIUM: Magnesium: 2.7 mg/dL — ABNORMAL HIGH (ref 1.7–2.4)

## 2024-06-29 LAB — PROCALCITONIN: Procalcitonin: 0.82 ng/mL

## 2024-06-29 LAB — PHOSPHORUS: Phosphorus: 3.2 mg/dL (ref 2.5–4.6)

## 2024-06-29 MED ORDER — VANCOMYCIN HCL 1500 MG/300ML IV SOLN
1500.0000 mg | Freq: Once | INTRAVENOUS | Status: AC
  Administered 2024-06-29: 1500 mg via INTRAVENOUS
  Filled 2024-06-29: qty 300

## 2024-06-29 MED ORDER — ETOMIDATE 2 MG/ML IV SOLN
INTRAVENOUS | Status: AC
  Administered 2024-06-29: 20 mg via INTRAVENOUS
  Filled 2024-06-29: qty 20

## 2024-06-29 MED ORDER — SUCCINYLCHOLINE CHLORIDE 200 MG/10ML IV SOSY
PREFILLED_SYRINGE | INTRAVENOUS | Status: AC
  Filled 2024-06-29: qty 10

## 2024-06-29 MED ORDER — NOREPINEPHRINE 4 MG/250ML-% IV SOLN
0.0000 ug/min | INTRAVENOUS | Status: DC
  Administered 2024-06-29: 10 ug/min via INTRAVENOUS

## 2024-06-29 MED ORDER — FUROSEMIDE 10 MG/ML IJ SOLN: 40.0000 mg | Freq: Once | INTRAMUSCULAR | Status: AC

## 2024-06-29 MED ORDER — ROCURONIUM BROMIDE 10 MG/ML (PF) SYRINGE: 100.0000 mg | PREFILLED_SYRINGE | Freq: Once | INTRAVENOUS | Status: AC

## 2024-06-29 MED ORDER — METOPROLOL TARTRATE 5 MG/5ML IV SOLN: 5.0000 mg | Freq: Once | INTRAVENOUS | Status: AC

## 2024-06-29 MED ORDER — KETAMINE HCL 50 MG/5ML IJ SOSY
PREFILLED_SYRINGE | INTRAMUSCULAR | Status: AC
  Filled 2024-06-29: qty 10

## 2024-06-29 MED ORDER — MIDAZOLAM HCL 2 MG/2ML IJ SOLN
INTRAMUSCULAR | Status: AC
  Administered 2024-06-29: 2 mg via INTRAVENOUS
  Filled 2024-06-29: qty 2

## 2024-06-29 MED ORDER — FENTANYL CITRATE (PF) 50 MCG/ML IJ SOSY
PREFILLED_SYRINGE | INTRAMUSCULAR | Status: AC
  Administered 2024-06-29: 50 ug via INTRAVENOUS
  Filled 2024-06-29: qty 2

## 2024-06-29 MED ORDER — PROPOFOL 1000 MG/100ML IV EMUL
INTRAVENOUS | Status: AC
  Administered 2024-06-29: 20 ug/kg/min via INTRAVENOUS
  Filled 2024-06-29: qty 100

## 2024-06-29 MED ORDER — FENTANYL 2500MCG IN NS 250ML (10MCG/ML) PREMIX INFUSION
0.0000 ug/h | INTRAVENOUS | Status: DC
  Administered 2024-06-30: 100 ug/h via INTRAVENOUS
  Administered 2024-06-30: 200 ug/h via INTRAVENOUS
  Administered 2024-07-01: 100 ug/h via INTRAVENOUS
  Administered 2024-07-03: 75 ug/h via INTRAVENOUS
  Administered 2024-07-04: 25 ug/h via INTRAVENOUS
  Filled 2024-06-29 (×5): qty 250

## 2024-06-29 MED ORDER — FENTANYL 2500MCG IN NS 250ML (10MCG/ML) PREMIX INFUSION
INTRAVENOUS | Status: AC
  Administered 2024-06-29: 50 ug/h via INTRAVENOUS
  Filled 2024-06-29: qty 250

## 2024-06-29 MED ORDER — FENTANYL CITRATE (PF) 50 MCG/ML IJ SOSY: 50.0000 ug | PREFILLED_SYRINGE | Freq: Once | INTRAMUSCULAR | Status: AC

## 2024-06-29 MED ORDER — METOPROLOL TARTRATE 5 MG/5ML IV SOLN
5.0000 mg | Freq: Four times a day (QID) | INTRAVENOUS | Status: DC
  Administered 2024-06-29 – 2024-06-30 (×3): 5 mg via INTRAVENOUS
  Filled 2024-06-29 (×5): qty 5

## 2024-06-29 MED ORDER — PROPOFOL 1000 MG/100ML IV EMUL
0.0000 ug/kg/min | INTRAVENOUS | Status: DC
Start: 2024-06-29 — End: 2024-07-03
  Administered 2024-06-29 – 2024-06-30 (×2): 35 ug/kg/min via INTRAVENOUS
  Administered 2024-06-30: 30 ug/kg/min via INTRAVENOUS
  Administered 2024-06-30: 25 ug/kg/min via INTRAVENOUS
  Administered 2024-06-30: 30 ug/kg/min via INTRAVENOUS
  Administered 2024-07-01 – 2024-07-02 (×4): 25 ug/kg/min via INTRAVENOUS
  Administered 2024-07-02: 20 ug/kg/min via INTRAVENOUS
  Filled 2024-06-29 (×10): qty 100

## 2024-06-29 MED ORDER — ETOMIDATE 2 MG/ML IV SOLN: 20.0000 mg | Freq: Once | INTRAVENOUS | Status: AC

## 2024-06-29 MED ORDER — MIDAZOLAM HCL (PF) 2 MG/2ML IJ SOLN: 2.0000 mg | Freq: Once | INTRAMUSCULAR | Status: AC

## 2024-06-29 MED ORDER — ROCURONIUM BROMIDE 10 MG/ML (PF) SYRINGE
PREFILLED_SYRINGE | INTRAVENOUS | Status: AC
  Administered 2024-06-29: 100 mg via INTRAVENOUS
  Filled 2024-06-29: qty 10

## 2024-06-29 MED ORDER — MIDAZOLAM HCL (PF) 2 MG/2ML IJ SOLN
2.0000 mg | Freq: Once | INTRAMUSCULAR | Status: DC
  Filled 2024-06-29: qty 2

## 2024-06-29 MED ORDER — METOPROLOL TARTRATE 5 MG/5ML IV SOLN
INTRAVENOUS | Status: AC
  Administered 2024-06-29: 5 mg via INTRAVENOUS
  Filled 2024-06-29: qty 5

## 2024-06-29 MED ORDER — NOREPINEPHRINE 4 MG/250ML-% IV SOLN
INTRAVENOUS | Status: AC
  Filled 2024-06-29: qty 250

## 2024-06-29 MED ORDER — VANCOMYCIN HCL 1500 MG/300ML IV SOLN
1500.0000 mg | INTRAVENOUS | Status: DC
  Administered 2024-06-30: 1500 mg via INTRAVENOUS
  Filled 2024-06-29: qty 300

## 2024-06-29 MED ORDER — METHYLPREDNISOLONE SODIUM SUCC 40 MG IJ SOLR
32.0000 mg | Freq: Every day | INTRAMUSCULAR | Status: AC
Start: 2024-06-29 — End: 2024-06-29
  Administered 2024-06-29: 32 mg via INTRAVENOUS
  Filled 2024-06-29: qty 1

## 2024-06-29 MED ORDER — PHENYLEPHRINE 80 MCG/ML (10ML) SYRINGE FOR IV PUSH (FOR BLOOD PRESSURE SUPPORT)
PREFILLED_SYRINGE | INTRAVENOUS | Status: AC
  Filled 2024-06-29: qty 20

## 2024-06-29 MED ORDER — FUROSEMIDE 10 MG/ML IJ SOLN
INTRAMUSCULAR | Status: AC
Start: 2024-06-29 — End: 2024-06-29
  Administered 2024-06-29: 40 mg via INTRAVENOUS
  Filled 2024-06-29: qty 6

## 2024-06-29 MED ORDER — PIPERACILLIN-TAZOBACTAM 3.375 G IVPB
3.3750 g | Freq: Three times a day (TID) | INTRAVENOUS | Status: AC
Start: 2024-06-29 — End: 2024-07-06
  Administered 2024-06-29 – 2024-07-05 (×20): 3.375 g via INTRAVENOUS
  Filled 2024-06-29 (×19): qty 50

## 2024-06-29 NOTE — Procedures (Addendum)
 Called to bedside for ongoing resp distress, gurgling respirations: patient using accessory muscles, temp 39C, unfortunately think will have to reintubate, discussed with family and Dr. Harold.  Intubation Procedure Note  Katie Waller  [id number]  1945/11/26  Date:06/29/24  Time:2:06 PM   Provider Performing:Lacresia Darwish C Claudene    Procedure: Intubation (31500)  Indication(s) Respiratory Failure  Consent Unable to obtain consent due to emergent nature of procedure.   Anesthesia Etomidate, Versed, Fentanyl , and Rocuronium    Time Out Verified patient identification, verified procedure, site/side was marked, verified correct patient position, special equipment/implants available, medications/allergies/relevant history reviewed, required imaging and test results available.   Sterile Technique Usual hand hygeine, masks, and gloves were used   Procedure Description Patient positioned in bed supine.  Sedation given as noted above.  Patient was intubated with endotracheal tube using Glidescope.  View was Grade 1 full glottis .  Number of attempts was 1.  Colorimetric CO2 detector was consistent with tracheal placement.   Complications/Tolerance None; patient tolerated the procedure well. Chest X-ray is ordered to verify placement.   EBL Minimal   Specimen(s) None

## 2024-06-29 NOTE — Progress Notes (Signed)
 Per discussion w/ Dr Harold, pt was placed on 6 lpm [place]. Within a few minutes pt developed severe SOB, increased WOB and HR 160's.  Pt was immediately placed back on bipap and MD notified.  PRN neb administered, RN at bedside w/ pt currently. Once on bipap for several minutes, pt began to slowly recover, pt still with increased WOB but appears slightly improved.

## 2024-06-29 NOTE — Plan of Care (Signed)
 Rt obtained blood gas and per MD can transition to [place], placed on 6L. Oral care performed. Pt did not tolerate [place], tachypneic and tachycardic in 170s and visibly retractions noted. Placed back on Bipap (lasted 5-47min), HR still in 170s, 5mg  metop given HR down to 140s but still struggling. Re-intubated, infusion: prop and fent. Norepi started d/t severe post intubation hypotension sbp in 50s. Lasix  given UOP: .  T MAX 39.1, ice packs applied and cooling blankets applied, normothermia achieved. . CVP trended, 13-14.   Problem: Clinical Measurements: Goal: Ability to maintain clinical measurements within normal limits will improve Outcome: Not Progressing Goal: Respiratory complications will improve Outcome: Not Progressing   Problem: Education: Goal: Knowledge of General Education information will improve Description: Including pain rating scale, medication(s)/side effects and non-pharmacologic comfort measures Outcome: Progressing   Problem: Health Behavior/Discharge Planning: Goal: Ability to manage health-related needs will improve Outcome: Progressing   Problem: Clinical Measurements: Goal: Will remain free from infection Outcome: Progressing Goal: Diagnostic test results will improve Outcome: Progressing Goal: Cardiovascular complication will be avoided Outcome: Progressing   Problem: Activity: Goal: Risk for activity intolerance will decrease Outcome: Progressing   Problem: Nutrition: Goal: Adequate nutrition will be maintained Outcome: Progressing   Problem: Coping: Goal: Level of anxiety will decrease Outcome: Progressing   Problem: Elimination: Goal: Will not experience complications related to bowel motility Outcome: Progressing Goal: Will not experience complications related to urinary retention Outcome: Progressing   Problem: Pain Managment: Goal: General experience of comfort will improve and/or be controlled Outcome: Progressing   Problem:  Safety: Goal: Ability to remain free from injury will improve Outcome: Progressing   Problem: Skin Integrity: Goal: Risk for impaired skin integrity will decrease Outcome: Progressing   Problem: Education: Goal: Ability to describe self-care measures that may prevent or decrease complications (Diabetes Survival Skills Education) will improve Outcome: Progressing Goal: Individualized Educational Video(s) Outcome: Progressing   Problem: Coping: Goal: Ability to adjust to condition or change in health will improve Outcome: Progressing   Problem: Fluid Volume: Goal: Ability to maintain a balanced intake and output will improve Outcome: Progressing   Problem: Health Behavior/Discharge Planning: Goal: Ability to identify and utilize available resources and services will improve Outcome: Progressing Goal: Ability to manage health-related needs will improve Outcome: Progressing   Problem: Metabolic: Goal: Ability to maintain appropriate glucose levels will improve Outcome: Progressing   Problem: Nutritional: Goal: Maintenance of adequate nutrition will improve Outcome: Progressing Goal: Progress toward achieving an optimal weight will improve Outcome: Progressing   Problem: Skin Integrity: Goal: Risk for impaired skin integrity will decrease Outcome: Progressing   Problem: Tissue Perfusion: Goal: Adequacy of tissue perfusion will improve Outcome: Progressing   Problem: Fluid Volume: Goal: Hemodynamic stability will improve Outcome: Progressing   Problem: Clinical Measurements: Goal: Diagnostic test results will improve Outcome: Progressing Goal: Signs and symptoms of infection will decrease Outcome: Progressing   Problem: Respiratory: Goal: Ability to maintain adequate ventilation will improve Outcome: Progressing

## 2024-06-29 NOTE — Progress Notes (Signed)
 PT Cancellation Note  Patient Details Name: Katie Waller MRN: [id number] DOB: 08-Jun-1946   Cancelled Treatment:    Reason Eval/Treat Not Completed: Patient not medically ready  Recently re-intubated before PT evaluation. Will hold at this time, attempt comprehensive evaluation tomorrow if things appear stable again. Please secure chat [facility] PT if urgent evaluation needed.  Leontine Roads, PT, DPT [facility]  Re[facility] Services Physical Therapist Office: [phone] Website: [email or web address]  Leontine GORMAN Roads 06/29/2024, 4:29 PM

## 2024-06-29 NOTE — Progress Notes (Signed)
 Per discussion w/ Dr Harold after ABG results, peep was weaned from 8 down to 5, fio2 weaned to 50%, Sat now 100%.    Per MD, hold off on a-line at this time.

## 2024-06-29 NOTE — Progress Notes (Signed)
 DR Harold notified that despite metop 5mg  IVP, pt still tachycardic in high 140s-150s. States he is ok with current HR to notify if >170.

## 2024-06-29 NOTE — Plan of Care (Signed)
  Problem: Education: Goal: Knowledge of General Education information will improve Description: Including pain rating scale, medication(s)/side effects and non-pharmacologic comfort measures Outcome: Progressing   Problem: Health Behavior/Discharge Planning: Goal: Ability to manage health-related needs will improve Outcome: Progressing   Problem: Clinical Measurements: Goal: Ability to maintain clinical measurements within normal limits will improve Outcome: Progressing Goal: Diagnostic test results will improve Outcome: Progressing Goal: Respiratory complications will improve Outcome: Progressing   Problem: Coping: Goal: Level of anxiety will decrease Outcome: Progressing   Problem: Elimination: Goal: Will not experience complications related to bowel motility Outcome: Progressing Goal: Will not experience complications related to urinary retention Outcome: Progressing   Problem: Pain Managment: Goal: General experience of comfort will improve and/or be controlled Outcome: Progressing   Problem: Safety: Goal: Ability to remain free from injury will improve Outcome: Progressing   Problem: Skin Integrity: Goal: Risk for impaired skin integrity will decrease Outcome: Progressing   Problem: Coping: Goal: Ability to adjust to condition or change in health will improve Outcome: Progressing   Problem: Fluid Volume: Goal: Ability to maintain a balanced intake and output will improve Outcome: Progressing   Problem: Nutrition: Goal: Adequate nutrition will be maintained Outcome: Not Progressing   Problem: Nutritional: Goal: Maintenance of adequate nutrition will improve Outcome: Not Progressing

## 2024-06-29 NOTE — Progress Notes (Signed)
 Pharmacy Antibiotic Note  Katie Waller is a 78 y.o. female admitted on 06/25/2024 with sepsis.  Pharmacy has been consulted for Zosyn and Vancomycin dosing.  Plan: Zosyn 3.375g IV q8h (4 hour infusion). Vancomycin 1500 mg IV once, followed by 750 mg q24h   Height: 5' 1 (154.9 cm) Weight: 78 kg (171 lb 15.3 oz) IBW/kg (Calculated) : 47.8  Temp (24hrs), Avg:99.1 F (37.3 C), Min:96.1 F (35.6 C), Max:101.8 F (38.8 C)  Recent Labs  Lab 06/25/24 0922 06/25/24 0927 06/26/24 0447 06/27/24 0451 06/28/24 0824 06/29/24 0312  WBC 9.5  --  8.0 9.8 13.0* 9.1  CREATININE 1.12* 1.10* 0.98 1.01* 1.13* 1.18*  LATICACIDVEN  --  1.0  --   --   --   --     Estimated Creatinine Clearance: 39 mL/min (A) (by C-G formula based on SCr of 1.18 mg/dL (H)).    Allergies  Allergen Reactions   Ace Inhibitors Swelling   Diphenhydramine Other (See Comments)    Hyperactive/jittery    Doxycycline Hyclate Nausea And Vomiting   Codeine Nausea And Vomiting   Antimicrobials:  CRO 10/22>10/24 Cefepime 10/21 Azithro 10/21> 10/23 Flagyl 10/21 x1 Vanc 10/21 x1  Microbiology results: 10/21 BCX: BCID staph 10/21 MRSA (-) 10/21 Resp panel (-) 10/22: metapneumovirus (+) 10/22: legionella (-) 10/24 Bcx: contaminant (staph epi, staph cap)  Thank you for allowing pharmacy to be a part of this patient's care.  Paysley Poplar M Oluwademilade Mckiver 06/29/2024 1:47 PM

## 2024-06-29 NOTE — Progress Notes (Signed)
 Per RN, Dr Harold changed RR 25, peep 8.  Currently pt tol well.

## 2024-06-29 NOTE — Progress Notes (Signed)
 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 Code stroke called. On arrival to ED patient emergently intubated for acute respiratory distress and airway protection. CT Head with no ICH or large infarct, CTA head and neck with no LVO. CXR concerning for a Pneumonia with visible BL lung apices on CTA demonstrating infection/inflammation. Code stroke cancelled with presentation suspicious for sepsis. Patient received 30ml/kg crystalloid, pan cultured, and started empirically on Cefepime/Vanc/Azithromycin . Of note, family reports patient has recently not felt well and likely has had a cold. PCCM consulted for ICU admission.   On exam in ED, patient febrile to >101 and hypotensive, likely multifactorial in setting of presumed sepsis and recent sedation on induction with underlying heart disease. Norepinephrine gtt was initiated for MAPs < 65 and patient was transferred to ICU with plans to gain central access for vasopressor administration and arterial line placement for hemodynamic monitoring.   Family at bedside and indicated Full Code status. All questions were addressed at that time.  Pertinent  Medical History   Past Medical History:  Diagnosis Date   Abnormal finding on EKG 09/19/2013   Acute on chronic respiratory failure with hypercapnia (HCC)    Acute respiratory failure (HCC) 06/08/2016   Anxiety 11/09/2020   Arthritis    Asthma    Carotid bruit 11/09/2020   Chronic diastolic CHF (congestive heart failure) (HCC)  07/28/2017   Chronic obstructive pulmonary disease, unspecified (HCC) 11/09/2020   COPD, group D, by GOLD 2017 classification (HCC) 09/17/2013   Cough 11/09/2020   Decreased estrogen level 11/09/2020   Diabetes (HCC)    Dyspnea    Edema 11/09/2020   Essential hypertension 09/18/2013   Goals of care, counseling/discussion    Hardening of the aorta (main artery of the heart) 11/09/2020   Heart disease    History of radiation therapy    Left Lung- 06/20/23-06/26/23- Dr. 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   Transient ischemic attack 11/09/2020   Venous stasis of both lower extremities 06/13/2016     Significant Hospital Events: Including procedures, antibiotic start and stop dates in addition to other pertinent events   Found unresponsive at home->ED>Code stroke>intubated for ARD/airway protection>CTH/CTA negative>Code stroke cancelled>Hypotensive>started on pressors>Admit to ICU  Arterial line placed 10/21 10/22 and 10/23 unsuccessful extubation 10/24: Despite maximal Precedex and fentanyl  pushes, unable to safely and comfortably clean the patient up so added prn Versed and started fentanyl  infusion. Rectal tube added for diarrhea.  10/24: 2/3 BC resulted in staph capitis/epidermitis, extubated on BiPAP  Interim History / Subjective:  Patient remained on BiPAP postextubation since yesterday She is  much more calm and relaxed Remained afebrile  Objective    Blood pressure 124/73, pulse 85, temperature 99 F (37.2 C), resp.  rate 16, height 5' 1 (1.549 m), weight 78 kg, SpO2 100%. CVP:  [22 mmHg] 22 mmHg  Vent Mode: PCV;BIPAP FiO2 (%):  [40 %-50 %] 40 % Set Rate:  [12 bmp-16 bmp] 16 bmp PEEP:  [6 cmH20] 6 cmH20   Intake/Output Summary (Last 24 hours) at 06/29/2024 0838 Last data filed at 06/29/2024 0800 Gross per 24 hour  Intake 859.7 ml  Output 1365 ml  Net -505.3 ml   Filed Weights   06/27/24 0500 06/28/24 0500 06/29/24 0414  Weight: 74 kg 76 kg 78 kg    Examination: General: Acute on chronically ill-appearing obese female, lying on the bed HEENT: [place]/AT, eyes anicteric.  moist mucus membranes.  On BiPAP Neuro: Alert, awake following commands, moving all 4 extremities Chest: Diminished air entry all over Heart: Regular rate and rhythm, no murmurs or gallops Abdomen: Soft, nontender, nondistended, bowel sounds present  Labs reviewed  Patient Lines/Drains/Airways Status     Active Line/Drains/Airways     Name Placement date Placement time Site Days   CVC Triple Lumen 06/25/24 Right Internal jugular 06/25/24  1330  -- 4   Urethral Catheter Bernardino MATSU, RN Temperature probe 16 Fr. 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  Continue BiPAP for now Repeat ABGs pending, if pH has normalized, will remove BiPAP and continue nasal cannula oxygen , at baseline she is on 5 to 6 L oxygen  Continue taper steroid Continue nebs Continue IV antibiotic with ceftriaxone  Completed 3-day  course of azithromycin  Hold diuretics Monitor intake and output Remained afebrile, I do not think patient had true bacteremia Converted back to sinus rhythm Diet and exercise counseling provided  Labs   CBC: Recent Labs  Lab 06/25/24 0922 06/25/24 0927 06/26/24 0447 06/27/24 0451 06/28/24 0824 06/28/24 1433 06/28/24 1613 06/29/24 0312  WBC 9.5  --  8.0 9.8 13.0*  --   --  9.1  NEUTROABS 8.6*  --   --   --   --   --   --   --   HGB 10.8*   < > 9.2* 9.2* 9.8* 10.5* 10.2* 8.5*  HCT 38.4   < > 30.4* 30.4* 32.5* 31.0* 30.0* 27.8*  MCV 101.9*  --  95.9 95.9 96.7  --   --  96.2  PLT 295  --  202 220 307  --   --  259   < > = values in this interval not displayed.    Basic Metabolic Panel: Recent Labs  Lab 06/25/24 0922 06/25/24 0927 06/25/24 1016 06/25/24 1824 06/25/24 1830 06/26/24 0447 06/27/24 0451 06/27/24 1503 06/28/24 0659 06/28/24 0824 06/28/24 1433 06/28/24 1613 06/29/24 0312  NA 139 137   < >  --    < > 135 139  --   --  140 141 141 140  K 5.4* 5.2*   < >  --    < >  4.7 4.8  --   --  5.0 4.9 4.5 4.6  CL 93* 94*  --   --   --  102 102  --   --  100  --   --  102  CO2 36*  --   --   --   --  27 28  --   --  27  --   --  29  GLUCOSE 181* 184*  --   --   --  175* 143*  --   --  252*  --   --  135*  BUN 16 20  --   --   --  23 25*  --   --  35*  --   --  39*  CREATININE 1.12* 1.10*  --   --   --  0.98 1.01*  --   --  1.13*  --   --  1.18*  CALCIUM  9.1  --   --   --   --  8.1* 8.2*  --   --  9.1  --   --  9.0  MG  --   --   --  1.8  --  2.1  --  2.3 2.4  --   --   --  2.7*  PHOS  --   --   --  2.7  --  2.8  --  3.7 3.5  --   --   --  3.2   < > = values in this interval not displayed.   GFR: Estimated Creatinine Clearance: 39 mL/min (A) (by C-G formula based on SCr of 1.18 mg/dL (H)). Recent Labs  Lab 06/25/24 0927 06/26/24 0445 06/26/24 0447 06/27/24 0451 06/28/24 0824 06/29/24 0312  PROCALCITON  --  2.69  --   --   --   --   WBC  --   --  8.0 9.8 13.0*  9.1  LATICACIDVEN 1.0  --   --   --   --   --     Liver Function Tests: Recent Labs  Lab 06/25/24 0922 06/27/24 0451 06/28/24 0824 06/29/24 0312  AST 24 91* 75* 59*  ALT 11 107* 141* 128*  ALKPHOS 39 37* 40 40  BILITOT 0.6 0.2 0.2 0.4  PROT 7.0 5.7* 6.3* 5.4*  ALBUMIN 3.3* 2.5* 2.8* 2.6*   No results for input(s): LIPASE, AMYLASE in the last 168 hours. No results for input(s): AMMONIA in the last 168 hours.  ABG    Component Value Date/Time   PHART 7.276 (L) 06/28/2024 1613   PCO2ART 64.0 (H) 06/28/2024 1613   PO2ART 131 (H) 06/28/2024 1613   HCO3 29.8 (H) 06/28/2024 1613   TCO2 32 06/28/2024 1613   O2SAT 98 06/28/2024 1613     Coagulation Profile: Recent Labs  Lab 06/25/24 0922  INR 0.9    Cardiac Enzymes: No results for input(s): CKTOTAL, CKMB, CKMBINDEX, TROPONINI in the last 168 hours.  HbA1C: Hgb A1c MFr Bld  Date/Time Value Ref Range Status  06/26/2024 04:47 AM 5.6 4.8 - 5.6 % Final    Comment:    (NOTE) Diagnosis of Diabetes The following HbA1c ranges recommended by the American Diabetes Association (ADA) may be used as an aid in the diagnosis of diabetes mellitus.  Hemoglobin             Suggested A1C NGSP%              Diagnosis  <5.7  Non Diabetic  5.7-6.4                Pre-Diabetic  >6.4                   Diabetic  <7.0                   Glycemic control for                       adults with diabetes.    09/17/2013 01:57 PM 6.1 (H) <5.7 % Final    Comment:    (NOTE)                                                                       According to the ADA Clinical Practice Recommendations for 2011, when HbA1c is used as a screening test:  >=6.5%   Diagnostic of Diabetes Mellitus           (if abnormal result is confirmed) 5.7-6.4%   Increased risk of developing Diabetes Mellitus References:Diagnosis and Classification of Diabetes Mellitus,Diabetes Care,2011,34(Suppl 1):S62-S69 and Standards of  Medical Care in         Diabetes - 2011,Diabetes Care,2011,34 (Suppl 1):S11-S61.    CBG: Recent Labs  Lab 06/28/24 1544 06/28/24 1938 06/28/24 2320 06/29/24 0320 06/29/24 0748  GLUCAP 124* 91 107* 113* 111*    Review of Systems:     Past Medical History:  She,  has a past medical history of Abnormal finding on EKG (09/19/2013), Acute on chronic respiratory failure with hypercapnia (HCC), Acute respiratory failure (HCC) (06/08/2016), Anxiety (11/09/2020), Arthritis, Asthma, Carotid bruit (11/09/2020), Chronic diastolic CHF (congestive heart failure) (HCC) (07/28/2017), Chronic obstructive pulmonary disease, unspecified (HCC) (11/09/2020), COPD, group D, by GOLD 2017 classification (HCC) (09/17/2013), Cough (11/09/2020), Decreased estrogen level (11/09/2020), Diabetes (HCC), Dyspnea, Edema (11/09/2020), Essential hypertension (09/18/2013), Goals of care, counseling/discussion, Hardening of the aorta (main artery of the heart) (11/09/2020), Heart disease, History of radiation therapy, Hypertension, Hypertensive heart failure (HCC) (11/09/2020), Hypoxia (11/09/2020), Insomnia (11/09/2020), Iron deficiency anemia (11/09/2020), Large liver (11/09/2020), Near syncope (09/17/2013), Osteoporosis (11/09/2020), Palliative care encounter, Prediabetes (09/19/2013), Pure hypercholesterolemia (11/09/2020), Raynaud's disease (11/09/2020), Sinus tachycardia (09/18/2013), Skin sensation disturbance (11/09/2020), Smoker (09/17/2013), 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   Surgical History:   Past Surgical History:  Procedure Laterality Date   BRONCHIAL BIOPSY  05/25/2023   Procedure: BRONCHIAL BIOPSIES;  Surgeon: Brenna Adine CROME, DO;  Location: [facility];  Service: Pulmonary;;   BRONCHIAL NEEDLE ASPIRATION BIOPSY  05/25/2023   Procedure: BRONCHIAL NEEDLE ASPIRATION BIOPSIES;  Surgeon: Brenna Adine CROME, DO;  Location: [facility];  Service: Pulmonary;;   FIDUCIAL MARKER PLACEMENT  05/25/2023   Procedure: FIDUCIAL MARKER PLACEMENT;  Surgeon: Brenna Adine CROME, DO;  Location: [facility];  Service: Pulmonary;;   KNEE SURGERY Left      Social History:   reports that she quit smoking about 6 years ago. Her smoking use included cigarettes. She started smoking about 6 years ago. She has a 55 pack-year smoking history. She has never used smokeless tobacco. She reports that she does not drink alcohol and does not use drugs.  Family History:  Her family history includes Hyperlipidemia in her father and mother; Hypertension in her father and mother.   Allergies Allergies  Allergen Reactions   Ace Inhibitors Swelling   Diphenhydramine Other (See Comments)    Hyperactive/jittery    Doxycycline Hyclate Nausea And Vomiting   Codeine Nausea And Vomiting     Home Medications  Prior to Admission medications   Medication Sig Start Date End Date Taking? Authorizing Provider  acetaminophen  (TYLENOL ) 500 MG tablet Take 1,000 mg by mouth every 6 (six) hours as needed for moderate pain.   Yes [provider]  albuterol  (PROVENTIL  HFA;VENTOLIN  HFA) 108 (90 BASE) MCG/ACT inhaler Inhale 2 puffs into the lungs every 6 (six) hours as needed for wheezing or shortness of breath. 09/19/13  Yes Johnson, Clanford L, MD  aspirin  81 MG chewable tablet Chew 1 tablet (81 mg total) by mouth daily. 09/19/13  Yes Johnson, Clanford L, MD  atorvastatin  (LIPITOR) 10 MG tablet TAKE 1 TABLET BY MOUTH EVERY DAY 07/21/23  Yes Krasowski, Robert J, MD  benzonatate  (TESSALON ) 100 MG capsule Take 100 mg by mouth 3 (three) times daily as needed for cough.    Yes [provider]  Biotin 10 MG CAPS Take 10 mg by mouth daily.   Yes [provider]  budesonide -formoterol  (SYMBICORT ) 160-4.5 MCG/ACT inhaler Inhale 2 puffs into the lungs 2 (two) times daily. 08/23/19  Yes Mannam, Praveen, MD  calcium  carbonate  (OSCAL) 1500 (600 Ca) MG TABS tablet Take 600 mg of elemental calcium  by mouth daily.   Yes [provider]  Cholecalciferol (VITAMIN D ) 50 MCG (2000 UT) tablet Take 2,000 Units by mouth daily.   Yes [provider]  diphenhydramine-acetaminophen  (TYLENOL  PM) 25-500 MG TABS tablet Take 1 tablet by mouth at bedtime.   Yes [provider]  furosemide  (LASIX ) 40 MG tablet Take 40 mg by mouth daily.   Yes [provider]  gabapentin (NEURONTIN) 300 MG capsule Take 900 mg by mouth at bedtime.   Yes [provider]  iron polysaccharides (NIFEREX) 150 MG capsule Take 150 mg by mouth daily. 11/05/20  Yes [provider]  loratadine (CLARITIN) 10 MG tablet Take 10 mg by mouth daily as needed for allergies.   Yes [provider]  LORazepam  (ATIVAN ) 1 MG tablet Take 1 tablet (1 mg total) by mouth every 6 (six) hours as needed for anxiety. 08/07/17  Yes Drusilla Sabas RAMAN, MD  losartan  (COZAAR ) 50 MG tablet Take 50 mg by mouth daily.   Yes [provider]  Multiple Vitamin (MULTIVITAMIN WITH MINERALS) TABS tablet Take 1 tablet by mouth daily.   Yes [provider]  raloxifene  (EVISTA ) 60 MG tablet Take 60 mg by mouth every morning. 08/07/20  Yes [provider]  Tiotropium Bromide  Monohydrate (SPIRIVA  RESPIMAT) 2.5 MCG/ACT AERS Inhale 1 puff into the lungs daily.   Yes [provider]  traZODone  (DESYREL ) 100 MG tablet Take 1 tablet (100 mg total) by mouth at bedtime as needed for sleep. 08/07/17  Yes Drusilla Sabas RAMAN, MD     The patient is critically ill due to acute on chronic hypoxic/hypercapnic respiratory failure, currently on BiPAP and Precedex infusion due to severe anxiety requiring frequent titration.  Critical care was necessary to treat or prevent imminent or life-threatening deterioration.  Critical care was time spent personally by me on the following activities: development of treatment plan with patient and/or  surrogate as well as nursing, discussions with consultants, evaluation of  patient's response to treatment, examination of patient, obtaining history from patient or surrogate, ordering and performing treatments and interventions, ordering and review of laboratory studies, ordering and review of radiographic studies, pulse oximetry, re-evaluation of patient's condition and participation in multidisciplinary rounds.   During this encounter critical care time was devoted to patient care services described in this note for 34 minutes.     Valinda Novas, MD [facility] See Amion for pager If no response to pager, please call [phone] until 7pm After 7pm, Please call E-link [phone]

## 2024-06-30 ENCOUNTER — Inpatient Hospital Stay (HOSPITAL_COMMUNITY)

## 2024-06-30 DIAGNOSIS — R0602 Shortness of breath: Secondary | ICD-10-CM | POA: Diagnosis not present

## 2024-06-30 DIAGNOSIS — Z4682 Encounter for fitting and adjustment of non-vascular catheter: Secondary | ICD-10-CM | POA: Diagnosis not present

## 2024-06-30 DIAGNOSIS — J90 Pleural effusion, not elsewhere classified: Secondary | ICD-10-CM | POA: Diagnosis not present

## 2024-06-30 DIAGNOSIS — B9781 Human metapneumovirus as the cause of diseases classified elsewhere: Secondary | ICD-10-CM | POA: Diagnosis not present

## 2024-06-30 DIAGNOSIS — J9621 Acute and chronic respiratory failure with hypoxia: Secondary | ICD-10-CM | POA: Diagnosis not present

## 2024-06-30 DIAGNOSIS — A419 Sepsis, unspecified organism: Secondary | ICD-10-CM | POA: Diagnosis not present

## 2024-06-30 DIAGNOSIS — J9622 Acute and chronic respiratory failure with hypercapnia: Secondary | ICD-10-CM | POA: Diagnosis not present

## 2024-06-30 LAB — POCT I-STAT 7, (LYTES, BLD GAS, ICA,H+H)
Acid-Base Excess: 5 mmol/L — ABNORMAL HIGH (ref 0.0–2.0)
Bicarbonate: 29.6 mmol/L — ABNORMAL HIGH (ref 20.0–28.0)
Calcium, Ion: 1.19 mmol/L (ref 1.15–1.40)
HCT: 29 % — ABNORMAL LOW (ref 36.0–46.0)
Hemoglobin: 9.9 g/dL — ABNORMAL LOW (ref 12.0–15.0)
O2 Saturation: 100 %
Patient temperature: 99.1
Potassium: 3.9 mmol/L (ref 3.5–5.1)
Sodium: 141 mmol/L (ref 135–145)
TCO2: 31 mmol/L (ref 22–32)
pCO2 arterial: 41.9 mmHg (ref 32–48)
pH, Arterial: 7.458 — ABNORMAL HIGH (ref 7.35–7.45)
pO2, Arterial: 204 mmHg — ABNORMAL HIGH (ref 83–108)

## 2024-06-30 LAB — GLUCOSE, CAPILLARY
Glucose-Capillary: 132 mg/dL — ABNORMAL HIGH (ref 70–99)
Glucose-Capillary: 137 mg/dL — ABNORMAL HIGH (ref 70–99)
Glucose-Capillary: 157 mg/dL — ABNORMAL HIGH (ref 70–99)
Glucose-Capillary: 201 mg/dL — ABNORMAL HIGH (ref 70–99)
Glucose-Capillary: 89 mg/dL (ref 70–99)
Glucose-Capillary: 99 mg/dL (ref 70–99)

## 2024-06-30 LAB — CULTURE, BLOOD (ROUTINE X 2)
Culture: NO GROWTH
Special Requests: ADEQUATE

## 2024-06-30 LAB — MAGNESIUM: Magnesium: 2.5 mg/dL — ABNORMAL HIGH (ref 1.7–2.4)

## 2024-06-30 LAB — BASIC METABOLIC PANEL WITH GFR
Anion gap: 14 (ref 5–15)
BUN: 44 mg/dL — ABNORMAL HIGH (ref 8–23)
CO2: 27 mmol/L (ref 22–32)
Calcium: 8.8 mg/dL — ABNORMAL LOW (ref 8.9–10.3)
Chloride: 102 mmol/L (ref 98–111)
Creatinine, Ser: 1.29 mg/dL — ABNORMAL HIGH (ref 0.44–1.00)
GFR, Estimated: 43 mL/min — ABNORMAL LOW (ref >60)
Glucose, Bld: 103 mg/dL — ABNORMAL HIGH (ref 70–99)
Potassium: 3.8 mmol/L (ref 3.5–5.1)
Sodium: 143 mmol/L (ref 135–145)

## 2024-06-30 LAB — MRSA NEXT GEN BY PCR, NASAL: MRSA by PCR Next Gen: NOT DETECTED

## 2024-06-30 LAB — TRIGLYCERIDES: Triglycerides: 218 mg/dL — ABNORMAL HIGH (ref <150)

## 2024-06-30 LAB — PHOSPHORUS: Phosphorus: 3 mg/dL (ref 2.5–4.6)

## 2024-06-30 MED ORDER — INSULIN ASPART 100 UNIT/ML IJ SOLN
0.0000 [IU] | INTRAMUSCULAR | Status: DC
  Administered 2024-06-30: 1 [IU] via SUBCUTANEOUS
  Administered 2024-06-30: 2 [IU] via SUBCUTANEOUS
  Administered 2024-07-01 – 2024-07-02 (×6): 1 [IU] via SUBCUTANEOUS
  Administered 2024-07-02: 2 [IU] via SUBCUTANEOUS
  Administered 2024-07-02: 1 [IU] via SUBCUTANEOUS
  Administered 2024-07-02: 4 [IU] via SUBCUTANEOUS
  Administered 2024-07-03: 1 [IU] via SUBCUTANEOUS
  Administered 2024-07-03: 2 [IU] via SUBCUTANEOUS
  Administered 2024-07-03: 1 [IU] via SUBCUTANEOUS

## 2024-06-30 MED ORDER — FENTANYL BOLUS VIA INFUSION
50.0000 ug | INTRAVENOUS | Status: DC | PRN
  Administered 2024-06-30 – 2024-07-04 (×8): 50 ug via INTRAVENOUS

## 2024-06-30 NOTE — Plan of Care (Signed)
  Problem: Clinical Measurements: Goal: Ability to maintain clinical measurements within normal limits will improve Outcome: Progressing Goal: Diagnostic test results will improve Outcome: Progressing Goal: Cardiovascular complication will be avoided Outcome: Progressing   Problem: Coping: Goal: Level of anxiety will decrease Outcome: Progressing   Problem: Elimination: Goal: Will not experience complications related to bowel motility Outcome: Progressing Goal: Will not experience complications related to urinary retention Outcome: Progressing   Problem: Pain Managment: Goal: General experience of comfort will improve and/or be controlled Outcome: Progressing   Problem: Safety: Goal: Ability to remain free from injury will improve Outcome: Progressing   Problem: Skin Integrity: Goal: Risk for impaired skin integrity will decrease Outcome: Progressing   Problem: Clinical Measurements: Goal: Respiratory complications will improve Outcome: Not Progressing   Problem: Activity: Goal: Risk for activity intolerance will decrease Outcome: Not Progressing   Problem: Nutrition: Goal: Adequate nutrition will be maintained Outcome: Not Progressing   Problem: Fluid Volume: Goal: Ability to maintain a balanced intake and output will improve Outcome: Not Progressing   Problem: Nutritional: Goal: Maintenance of adequate nutrition will improve Outcome: Not Progressing

## 2024-06-30 NOTE — Progress Notes (Signed)
 Resp culture collected by RT per MD order and sent to lab. MD aware.

## 2024-06-30 NOTE — Plan of Care (Signed)
  Problem: Education: Goal: Knowledge of General Education information will improve Description: Including pain rating scale, medication(s)/side effects and non-pharmacologic comfort measures Outcome: Not Progressing   Problem: Health Behavior/Discharge Planning: Goal: Ability to manage health-related needs will improve Outcome: Not Progressing   Problem: Clinical Measurements: Goal: Ability to maintain clinical measurements within normal limits will improve Outcome: Not Progressing Goal: Will remain free from infection Outcome: Not Progressing Goal: Diagnostic test results will improve Outcome: Progressing Goal: Respiratory complications will improve Outcome: Not Progressing Goal: Cardiovascular complication will be avoided Outcome: Progressing   Problem: Activity: Goal: Risk for activity intolerance will decrease Outcome: Not Progressing   Problem: Nutrition: Goal: Adequate nutrition will be maintained Outcome: Not Progressing   Problem: Coping: Goal: Level of anxiety will decrease Outcome: Not Progressing   Problem: Elimination: Goal: Will not experience complications related to bowel motility Outcome: Not Progressing Goal: Will not experience complications related to urinary retention Outcome: Not Progressing   Problem: Pain Managment: Goal: General experience of comfort will improve and/or be controlled Outcome: Progressing   Problem: Safety: Goal: Ability to remain free from injury will improve Outcome: Not Progressing   Problem: Skin Integrity: Goal: Risk for impaired skin integrity will decrease Outcome: Not Progressing   Problem: Education: Goal: Ability to describe self-care measures that may prevent or decrease complications (Diabetes Survival Skills Education) will improve Outcome: Not Progressing Goal: Individualized Educational Video(s) Outcome: Not Progressing   Problem: Coping: Goal: Ability to adjust to condition or change in health will  improve Outcome: Not Progressing   Problem: Fluid Volume: Goal: Ability to maintain a balanced intake and output will improve Outcome: Not Progressing   Problem: Health Behavior/Discharge Planning: Goal: Ability to identify and utilize available resources and services will improve Outcome: Not Progressing Goal: Ability to manage health-related needs will improve Outcome: Not Progressing   Problem: Metabolic: Goal: Ability to maintain appropriate glucose levels will improve Outcome: Not Progressing   Problem: Nutritional: Goal: Maintenance of adequate nutrition will improve Outcome: Not Progressing Goal: Progress toward achieving an optimal weight will improve Outcome: Not Progressing   Problem: Skin Integrity: Goal: Risk for impaired skin integrity will decrease Outcome: Not Progressing   Problem: Tissue Perfusion: Goal: Adequacy of tissue perfusion will improve Outcome: Not Progressing   Problem: Fluid Volume: Goal: Hemodynamic stability will improve Outcome: Not Progressing   Problem: Clinical Measurements: Goal: Diagnostic test results will improve Outcome: Not Progressing Goal: Signs and symptoms of infection will decrease Outcome: Not Progressing   Problem: Respiratory: Goal: Ability to maintain adequate ventilation will improve Outcome: Not Progressing

## 2024-06-30 NOTE — Progress Notes (Signed)
 PT Cancellation Note  Patient Details Name: Katie Waller MRN: [id number] DOB: 1945-10-20   Cancelled Treatment:    Reason Eval/Treat Not Completed: Medical issues which prohibited therapy  Pt intubated and sedated with RASS -4. PT will sign-off at this time and please reorder if/when appropriate.    Macario RAMAN, PT Acute Rehabilitation Services  Office [phone]  Macario SHAUNNA Soja 06/30/2024, 6:30 AM

## 2024-06-30 NOTE — Progress Notes (Signed)
 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 Code stroke called. On arrival to ED patient emergently intubated for acute respiratory distress and airway protection. CT Head with no ICH or large infarct, CTA head and neck with no LVO. CXR concerning for a Pneumonia with visible BL lung apices on CTA demonstrating infection/inflammation. Code stroke cancelled with presentation suspicious for sepsis. Patient received 30ml/kg crystalloid, pan cultured, and started empirically on Cefepime/Vanc/Azithromycin . Of note, family reports patient has recently not felt well and likely has had a cold. PCCM consulted for ICU admission.   On exam in ED, patient febrile to >101 and hypotensive, likely multifactorial in setting of presumed sepsis and recent sedation on induction with underlying heart disease. Norepinephrine gtt was initiated for MAPs < 65 and patient was transferred to ICU with plans to gain central access for vasopressor administration and arterial line placement for hemodynamic monitoring.   Family at bedside and indicated Full Code status. All questions were addressed at that time.  Pertinent  Medical History   Past Medical History:  Diagnosis Date   Abnormal finding on EKG 09/19/2013   Acute on chronic respiratory failure with hypercapnia (HCC)    Acute respiratory failure (HCC) 06/08/2016   Anxiety 11/09/2020   Arthritis    Asthma    Carotid bruit 11/09/2020   Chronic diastolic CHF (congestive heart failure) (HCC)  07/28/2017   Chronic obstructive pulmonary disease, unspecified (HCC) 11/09/2020   COPD, group D, by GOLD 2017 classification (HCC) 09/17/2013   Cough 11/09/2020   Decreased estrogen level 11/09/2020   Diabetes (HCC)    Dyspnea    Edema 11/09/2020   Essential hypertension 09/18/2013   Goals of care, counseling/discussion    Hardening of the aorta (main artery of the heart) 11/09/2020   Heart disease    History of radiation therapy    Left Lung- 06/20/23-06/26/23- Dr. 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   Transient ischemic attack 11/09/2020   Venous stasis of both lower extremities 06/13/2016     Significant Hospital Events: Including procedures, antibiotic start and stop dates in addition to other pertinent events   Found unresponsive at home->ED>Code stroke>intubated for ARD/airway protection>CTH/CTA negative>Code stroke cancelled>Hypotensive>started on pressors>Admit to ICU  Arterial line placed 10/21 10/22 and 10/23 unsuccessful extubation 10/24: Despite maximal Precedex and fentanyl  pushes, unable to safely and comfortably clean the patient up so added prn Versed and started fentanyl  infusion. Rectal tube added for diarrhea.  10/24: 2/3 BC resulted in staph capitis/epidermitis, extubated on BiPAP 10/25 patient remained on BiPAP for 24 hours, when she came off of BiPAP, could  not tolerate became tachypneic, tachycardic went into respiratory distress requiring endotracheal reintubation  Interim History / Subjective:   Patient was reintubated yesterday due to increasing respiratory distress after coming off of BiPAP Spiked fever with Tmax 102, now afebrile She required vasopressor support initially after intubation, currently off   Objective    Blood pressure 111/71, pulse 83, temperature 99.1 F (37.3 C), temperature source Bladder, resp. rate (!) 24, height 5' 1 (1.549 m), weight 77.9 kg, SpO2 100%. CVP:  [7 mmHg-21 mmHg] 12 mmHg  Vent Mode: PRVC FiO2 (%):  [40 %-100 %] 50 % Set Rate:  [16 bmp-25 bmp] 25 bmp Vt Set:  [380 mL] 380 mL PEEP:  [5 cmH20-10 cmH20] 5 cmH20 Plateau Pressure:  [24 cmH20] 24 cmH20   Intake/Output Summary (Last 24 hours) at 06/30/2024 0756 Last data filed at 06/30/2024 0741 Gross per 24 hour  Intake 1371.96 ml  Output 2140 ml  Net -768.04 ml   Filed Weights   06/28/24 0500 06/29/24 0414 06/30/24 0500  Weight: 76 kg 78 kg 77.9 kg    Examination: General: Crtitically ill-appearing female, orally intubated HEENT: [place]/AT, eyes anicteric.  ETT and OGT in place Neuro: Sedated, not following commands.  Eyes are closed.  Pupils 3 mm bilateral reactive to light Chest: Clear to auscultation bilaterally, no wheezes or rhonchi Heart: Regular rate and rhythm, no murmurs or gallops Abdomen: Soft, nondistended, bowel sounds present  Labs and images reviewed  Patient Lines/Drains/Airways Status     Active Line/Drains/Airways     Name Placement date Placement time Site Days   CVC Triple Lumen 06/25/24 Right Internal jugular 06/25/24  1330  -- 5   NG/OG Vented/Dual Lumen Oral External length of tube 60 cm 06/29/24  1228  Oral  1   Urethral Catheter Bernardino MATSU, RN Temperature probe 16 Fr. 06/25/24  1410  Temperature probe  5   Fecal Management System 06/28/24  0800  -- 2   Airway 7.5 mm 06/29/24  1228  -- 1   Wound 06/28/24 0800 Other (Comment) Perineum Medial 06/28/24  0800  Perineum  2         Resolved problem list  Staph epi/capitis bacteremia but likely  contamination  Assessment and Plan  Acute on chronic hypoxic/hypercapnic respiratory failure, requiring reintubation on 10/25 Sepsis in the setting of bilateral multifocal pneumonia with metapneumovirus and probable superimposed bacterial pneumonia Sedation related hypotension, resolved Acute COPD exacerbation Chronic HFpEF Paroxysmal A-fib with RVR Obesity 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  Patient was reintubated yesterday after went into respiratory distress FiO2 titrated down to 50% and PEEP at 5 Antibiotics broadened to vancomycin and Zosyn Obtain MRSA PCR to decide if she continued to need vancomycin Initially spiked fever with Tmax of 102, now afebrile Continue droplet precautions Follow-up respiratory culture Blood cultures are negative so far She is off pressors Continue tapering steroid and nebs She is not wheezing on physical exam Hold diuretics Remained in sinus rhythm Restart tube feeds Will try to wake her up to do weaning trial, we can try to extubate in the next 24 hours, if fails, patient's daughter is aware that she may end up requiring tracheostomy   Labs   CBC: Recent Labs  Lab 06/25/24 0922 06/25/24 0927 06/26/24 0447 06/27/24 0451 06/28/24 0824 06/28/24 1433 06/28/24 1613 06/29/24 0312 06/29/24 0925 06/29/24 1447  WBC 9.5  --  8.0 9.8 13.0*  --   --  9.1  --   --   NEUTROABS 8.6*  --   --   --   --   --   --   --   --   --  HGB 10.8*   < > 9.2* 9.2* 9.8* 10.5* 10.2* 8.5* 9.2* 9.5*  HCT 38.4   < > 30.4* 30.4* 32.5* 31.0* 30.0* 27.8* 27.0* 28.0*  MCV 101.9*  --  95.9 95.9 96.7  --   --  96.2  --   --   PLT 295  --  202 220 307  --   --  259  --   --    < > = values in this interval not displayed.    Basic Metabolic Panel: Recent Labs  Lab 06/26/24 0447 06/27/24 0451 06/27/24 1503 06/28/24 0659 06/28/24 0824 06/28/24 1433 06/28/24 1613 06/29/24 0312 06/29/24 0925 06/29/24 1447 06/30/24 0500  NA 135 139  --   --  140   <  > 141 140 141 140 143  K 4.7 4.8  --   --  5.0   < > 4.5 4.6 4.6 4.5 3.8  CL 102 102  --   --  100  --   --  102  --   --  102  CO2 27 28  --   --  27  --   --  29  --   --  27  GLUCOSE 175* 143*  --   --  252*  --   --  135*  --   --  103*  BUN 23 25*  --   --  35*  --   --  39*  --   --  44*  CREATININE 0.98 1.01*  --   --  1.13*  --   --  1.18*  --   --  1.29*  CALCIUM  8.1* 8.2*  --   --  9.1  --   --  9.0  --   --  8.8*  MG 2.1  --  2.3 2.4  --   --   --  2.7*  --   --  2.5*  PHOS 2.8  --  3.7 3.5  --   --   --  3.2  --   --  3.0   < > = values in this interval not displayed.   GFR: Estimated Creatinine Clearance: 35.6 mL/min (A) (by C-G formula based on SCr of 1.29 mg/dL (H)). Recent Labs  Lab 06/25/24 0927 06/26/24 0445 06/26/24 0447 06/27/24 0451 06/28/24 0824 06/29/24 0312  PROCALCITON  --  2.69  --   --   --  0.82  WBC  --   --  8.0 9.8 13.0* 9.1  LATICACIDVEN 1.0  --   --   --   --   --     Liver Function Tests: Recent Labs  Lab 06/25/24 0922 06/27/24 0451 06/28/24 0824 06/29/24 0312  AST 24 91* 75* 59*  ALT 11 107* 141* 128*  ALKPHOS 39 37* 40 40  BILITOT 0.6 0.2 0.2 0.4  PROT 7.0 5.7* 6.3* 5.4*  ALBUMIN 3.3* 2.5* 2.8* 2.6*   No results for input(s): LIPASE, AMYLASE in the last 168 hours. No results for input(s): AMMONIA in the last 168 hours.  ABG    Component Value Date/Time   PHART 7.472 (H) 06/29/2024 1447   PCO2ART 34.9 06/29/2024 1447   PO2ART 529 (H) 06/29/2024 1447   HCO3 25.2 06/29/2024 1447   TCO2 26 06/29/2024 1447   O2SAT 100 06/29/2024 1447     Coagulation Profile: Recent Labs  Lab 06/25/24 0922  INR 0.9    Cardiac Enzymes: No results for input(s): CKTOTAL, CKMB, CKMBINDEX, TROPONINI  in the last 168 hours.  HbA1C: Hgb A1c MFr Bld  Date/Time Value Ref Range Status  06/26/2024 04:47 AM 5.6 4.8 - 5.6 % Final    Comment:    (NOTE) Diagnosis of Diabetes The following HbA1c ranges recommended by the  American Diabetes Association (ADA) may be used as an aid in the diagnosis of diabetes mellitus.  Hemoglobin             Suggested A1C NGSP%              Diagnosis  <5.7                   Non Diabetic  5.7-6.4                Pre-Diabetic  >6.4                   Diabetic  <7.0                   Glycemic control for                       adults with diabetes.    09/17/2013 01:57 PM 6.1 (H) <5.7 % Final    Comment:    (NOTE)                                                                       According to the ADA Clinical Practice Recommendations for 2011, when HbA1c is used as a screening test:  >=6.5%   Diagnostic of Diabetes Mellitus           (if abnormal result is confirmed) 5.7-6.4%   Increased risk of developing Diabetes Mellitus References:Diagnosis and Classification of Diabetes Mellitus,Diabetes Care,2011,34(Suppl 1):S62-S69 and Standards of Medical Care in         Diabetes - 2011,Diabetes Care,2011,34 (Suppl 1):S11-S61.    CBG: Recent Labs  Lab 06/29/24 1652 06/29/24 1950 06/29/24 2334 06/30/24 0340 06/30/24 0752  GLUCAP 180* 157* 112* 89 99    The patient is critically ill due to acute on chronic hypoxic/hypercapnic respiratory failure, currently on BiPAP and Precedex infusion due to severe anxiety requiring frequent titration.  Critical care was necessary to treat or prevent imminent or life-threatening deterioration.  Critical care was time spent personally by me on the following activities: development of treatment plan with patient and/or surrogate as well as nursing, discussions with consultants, evaluation of patient's response to treatment, examination of patient, obtaining history from patient or surrogate, ordering and performing treatments and interventions, ordering and review of laboratory studies, ordering and review of radiographic studies, pulse oximetry, re-evaluation of patient's condition and participation in multidisciplinary rounds.   During  this encounter critical care time was devoted to patient care services described in this note for 38 minutes.     Valinda Novas, MD [facility] See Amion for pager If no response to pager, please call [phone] until 7pm After 7pm, Please call E-link [phone]

## 2024-07-01 ENCOUNTER — Inpatient Hospital Stay (HOSPITAL_COMMUNITY)

## 2024-07-01 DIAGNOSIS — I4891 Unspecified atrial fibrillation: Secondary | ICD-10-CM | POA: Diagnosis not present

## 2024-07-01 DIAGNOSIS — J90 Pleural effusion, not elsewhere classified: Secondary | ICD-10-CM | POA: Diagnosis not present

## 2024-07-01 DIAGNOSIS — N1831 Chronic kidney disease, stage 3a: Secondary | ICD-10-CM

## 2024-07-01 DIAGNOSIS — J123 Human metapneumovirus pneumonia: Secondary | ICD-10-CM

## 2024-07-01 DIAGNOSIS — J441 Chronic obstructive pulmonary disease with (acute) exacerbation: Secondary | ICD-10-CM

## 2024-07-01 DIAGNOSIS — J9621 Acute and chronic respiratory failure with hypoxia: Secondary | ICD-10-CM | POA: Diagnosis not present

## 2024-07-01 DIAGNOSIS — R739 Hyperglycemia, unspecified: Secondary | ICD-10-CM

## 2024-07-01 DIAGNOSIS — J129 Viral pneumonia, unspecified: Secondary | ICD-10-CM

## 2024-07-01 DIAGNOSIS — Z9911 Dependence on respirator [ventilator] status: Secondary | ICD-10-CM

## 2024-07-01 LAB — ECHOCARDIOGRAM COMPLETE
Area-P 1/2: 4.89 cm2
Calc EF: 54.7 %
Height: 61 in
S' Lateral: 3.1 cm
Single Plane A2C EF: 52.7 %
Single Plane A4C EF: 60.8 %
Weight: 2659.63 [oz_av]

## 2024-07-01 LAB — CBC
HCT: 30 % — ABNORMAL LOW (ref 36.0–46.0)
Hemoglobin: 9.3 g/dL — ABNORMAL LOW (ref 12.0–15.0)
MCH: 29.2 pg (ref 26.0–34.0)
MCHC: 31 g/dL (ref 30.0–36.0)
MCV: 94 fL (ref 80.0–100.0)
Platelets: 332 K/uL (ref 150–400)
RBC: 3.19 MIL/uL — ABNORMAL LOW (ref 3.87–5.11)
RDW: 13.6 % (ref 11.5–15.5)
WBC: 12.8 K/uL — ABNORMAL HIGH (ref 4.0–10.5)
nRBC: 4.5 % — ABNORMAL HIGH (ref 0.0–0.2)

## 2024-07-01 LAB — BASIC METABOLIC PANEL WITH GFR
Anion gap: 11 (ref 5–15)
BUN: 43 mg/dL — ABNORMAL HIGH (ref 8–23)
CO2: 28 mmol/L (ref 22–32)
Calcium: 8.7 mg/dL — ABNORMAL LOW (ref 8.9–10.3)
Chloride: 103 mmol/L (ref 98–111)
Creatinine, Ser: 1.14 mg/dL — ABNORMAL HIGH (ref 0.44–1.00)
GFR, Estimated: 50 mL/min — ABNORMAL LOW (ref >60)
Glucose, Bld: 196 mg/dL — ABNORMAL HIGH (ref 70–99)
Potassium: 4 mmol/L (ref 3.5–5.1)
Sodium: 142 mmol/L (ref 135–145)

## 2024-07-01 LAB — PROCALCITONIN: Procalcitonin: 1.61 ng/mL

## 2024-07-01 LAB — GLUCOSE, CAPILLARY
Glucose-Capillary: 151 mg/dL — ABNORMAL HIGH (ref 70–99)
Glucose-Capillary: 156 mg/dL — ABNORMAL HIGH (ref 70–99)
Glucose-Capillary: 166 mg/dL — ABNORMAL HIGH (ref 70–99)
Glucose-Capillary: 171 mg/dL — ABNORMAL HIGH (ref 70–99)
Glucose-Capillary: 176 mg/dL — ABNORMAL HIGH (ref 70–99)
Glucose-Capillary: 177 mg/dL — ABNORMAL HIGH (ref 70–99)
Glucose-Capillary: 216 mg/dL — ABNORMAL HIGH (ref 70–99)

## 2024-07-01 LAB — HEPARIN LEVEL (UNFRACTIONATED): Heparin Unfractionated: >1.1 [IU]/mL — ABNORMAL HIGH (ref 0.30–0.70)

## 2024-07-01 MED ORDER — PROSOURCE TF20 ENFIT COMPATIBL EN LIQD
60.0000 mL | Freq: Every day | ENTERAL | Status: DC
  Administered 2024-07-01 – 2024-07-05 (×5): 60 mL
  Filled 2024-07-01 (×6): qty 60

## 2024-07-01 MED ORDER — HEPARIN BOLUS VIA INFUSION
3500.0000 [IU] | Freq: Once | INTRAVENOUS | Status: AC
  Administered 2024-07-01: 3500 [IU] via INTRAVENOUS
  Filled 2024-07-01: qty 3500

## 2024-07-01 MED ORDER — OSMOLITE 1.2 CAL PO LIQD
1000.0000 mL | ORAL | Status: DC
  Administered 2024-07-01 – 2024-07-04 (×4): 1000 mL
  Filled 2024-07-01 (×6): qty 1000

## 2024-07-01 MED ORDER — METOPROLOL TARTRATE 12.5 MG HALF TABLET
12.5000 mg | ORAL_TABLET | Freq: Two times a day (BID) | ORAL | Status: DC
Start: 2024-07-01 — End: 2024-07-02
  Administered 2024-07-01 – 2024-07-02 (×3): 12.5 mg
  Filled 2024-07-01 (×3): qty 1

## 2024-07-01 MED ORDER — POTASSIUM CHLORIDE 20 MEQ PO PACK
40.0000 meq | PACK | Freq: Once | ORAL | Status: AC
  Administered 2024-07-01: 40 meq
  Filled 2024-07-01: qty 2

## 2024-07-01 MED ORDER — METOPROLOL TARTRATE 12.5 MG HALF TABLET: 12.5000 mg | ORAL_TABLET | Freq: Two times a day (BID) | ORAL | Status: DC

## 2024-07-01 MED ORDER — AMIODARONE IV BOLUS ONLY 150 MG/100ML
150.0000 mg | Freq: Once | INTRAVENOUS | Status: AC
  Administered 2024-07-01: 150 mg via INTRAVENOUS
  Filled 2024-07-01: qty 100

## 2024-07-01 MED ORDER — FUROSEMIDE 10 MG/ML IJ SOLN
60.0000 mg | Freq: Once | INTRAMUSCULAR | Status: AC
  Administered 2024-07-01: 60 mg via INTRAVENOUS
  Filled 2024-07-01: qty 6

## 2024-07-01 MED ORDER — HEPARIN (PORCINE) 25000 UT/250ML-% IV SOLN
800.0000 [IU]/h | INTRAVENOUS | Status: DC
  Administered 2024-07-01: 1050 [IU]/h via INTRAVENOUS
  Filled 2024-07-01: qty 250

## 2024-07-01 MED ORDER — HEPARIN (PORCINE) 25000 UT/250ML-% IV SOLN: 800.0000 [IU]/h | INTRAVENOUS | Status: DC

## 2024-07-01 NOTE — Plan of Care (Signed)
  Problem: Clinical Measurements: Goal: Ability to maintain clinical measurements within normal limits will improve 07/01/2024 0707 by Vertie Thedora MATSU, RN Outcome: Progressing 07/01/2024 0703 by Vertie Thedora MATSU, RN Outcome: Progressing Goal: Diagnostic test results will improve 07/01/2024 0707 by Vertie Thedora MATSU, RN Outcome: Progressing 07/01/2024 0703 by Vertie Thedora MATSU, RN Outcome: Progressing Goal: Respiratory complications will improve 07/01/2024 0707 by Vertie Thedora MATSU, RN Outcome: Progressing 07/01/2024 0703 by Vertie Thedora MATSU, RN Outcome: Progressing Goal: Cardiovascular complication will be avoided 07/01/2024 0707 by Vertie Thedora MATSU, RN Outcome: Progressing 07/01/2024 0703 by Vertie Thedora MATSU, RN Outcome: Progressing   Problem: Nutrition: Goal: Adequate nutrition will be maintained 07/01/2024 0707 by Vertie Thedora MATSU, RN Outcome: Progressing 07/01/2024 0703 by Vertie Thedora MATSU, RN Outcome: Progressing   Problem: Coping: Goal: Level of anxiety will decrease Outcome: Progressing   Problem: Elimination: Goal: Will not experience complications related to bowel motility Outcome: Progressing Goal: Will not experience complications related to urinary retention Outcome: Progressing   Problem: Pain Managment: Goal: General experience of comfort will improve and/or be controlled 07/01/2024 0707 by Vertie Thedora MATSU, RN Outcome: Progressing 07/01/2024 0703 by Vertie Thedora MATSU, RN Outcome: Progressing   Problem: Coping: Goal: Ability to adjust to condition or change in health will improve Outcome: Progressing   Problem: Nutritional: Goal: Maintenance of adequate nutrition will improve 07/01/2024 0707 by Vertie Thedora MATSU, RN Outcome: Progressing 07/01/2024 0703 by Vertie Thedora MATSU, RN Outcome: Progressing   Problem: Activity: Goal: Risk for activity intolerance will decrease Outcome: Not Progressing   Problem: Safety: Goal: Ability to remain free from injury will  improve Outcome: Not Progressing   Problem: Skin Integrity: Goal: Risk for impaired skin integrity will decrease 07/01/2024 0707 by Vertie Thedora MATSU, RN Outcome: Not Progressing 07/01/2024 0703 by Vertie Thedora MATSU, RN Outcome: Progressing   Problem: Fluid Volume: Goal: Ability to maintain a balanced intake and output will improve Outcome: Not Progressing   Problem: Skin Integrity: Goal: Risk for impaired skin integrity will decrease Outcome: Not Progressing

## 2024-07-01 NOTE — Progress Notes (Signed)
 Nutrition Follow-up  DOCUMENTATION CODES:  Not applicable  INTERVENTION:  Continue tube feeding via OGT: Osmolite 1.2 at 45 ml/h (1080 ml per day) Prosource TF20 60 ml 1x/d Provides 1376 kcal, 80 gm protein, 886 ml free water daily TF+propofol  = 1685 kcal/d  NUTRITION DIAGNOSIS:  Inadequate oral intake related to acute illness as evidenced by NPO status. - remains applicable   GOAL:  Patient will meet greater than or equal to 90% of their needs - progressing  MONITOR:  Vent status, Labs, Weight trends, TF tolerance  REASON FOR ASSESSMENT:  Ventilator    ASSESSMENT:  Pt admitted with AMS, sepsis, ARF. 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  10/21 admitted d/t PNA, HMPV infection; intubated; stroke/sz ruled out 10/23 failed vent wean; initiate tube feeds 10/24 - extubated 10/25 - reintubated  Patient is currently intubated on ventilator support  Pt discussed during ICU rounds and with RN and MD. Pt extubated but then was reintubated. TF restarted yesterday and are almost back to goal. Pt has consistently required propofol , will adjust rate to account for additional kcal.   MV: 9.1 L/min Temp (24hrs), Avg:98.2 F (36.8 C), Min:96.6 F (35.9 C), Max:99.9 F (37.7 C) MAP (cuff): 72-179mmHg  Propofol : 11.7 ml/hr (309 kcal/d at current rate)  Admit weight: 71 kg   Current weight: 75.4 kg    Intake/Output Summary (Last 24 hours) at 07/01/2024 1327 Last data filed at 07/01/2024 1300 Gross per 24 hour  Intake 1031.15 ml  Output 800 ml  Net 231.15 ml  Net IO Since Admission: 5,954.52 mL [07/01/24 1327]  Drains/Lines: CVC Right IJ Triple Lumen OGT (gastric per imaging) FMS 75mL x 24 hours UOP x 24 hours  Diet Order Hx: 10/21 - current: NPO since admission 10/23 - TF initiated via OGT 10/24 - TF stopped with extubation 10/26 - TF  restarted  Nutritionally Relevant Medications: Scheduled Meds:  insulin aspart  0-6 Units Subcutaneous Q4H   pantoprazole IV  40 mg Intravenous Q24H   polyethylene glycol  17 g Per Tube Daily   predniSONE   30 mg Per Tube Q breakfast   senna  1 tablet Per Tube Daily   Continuous Infusions:  feeding supplement (OSMOLITE 1.2 CAL) 30 mL/hr at 06/30/24 1805   piperacillin-tazobactam (ZOSYN)  IV 12.5 mL/hr at 07/01/24 1100   propofol  (DIPRIVAN ) infusion 25 mcg/kg/min (07/01/24 1100)   PRN Meds: ondansetron   Labs Reviewed: BUN 49, creatinine 1.14 CBG ranges from 89-201 mg/dL over the last 24 hours HgbA1c 5.6%  NUTRITION - FOCUSED PHYSICAL EXAM: Flowsheet Row Most Recent Value  Orbital Region No depletion  Upper Arm Region No depletion  Thoracic and Lumbar Region No depletion  Buccal Region Unable to assess  Temple Region Mild depletion  Clavicle Bone Region Mild depletion  Clavicle and Acromion Bone Region No depletion  Scapular Bone Region No depletion  Dorsal Hand Unable to assess  [hand mits]  Patellar Region No depletion  Anterior Thigh Region No depletion  Posterior Calf Region No depletion  Edema (RD Assessment) None  Hair Reviewed  Eyes Unable to assess  Mouth Unable to assess  Skin Reviewed  Nails Unable to assess    Diet Order:   Diet Order             Diet NPO time specified  Diet effective now  EDUCATION NEEDS:  No education needs have been identified at this time  Skin:  Skin Assessment: Reviewed RN Assessment Skin tear to the medial perineum (0.75 cm)  Last BM:  10/26 - via FMS FMS placed on 10/24  Height:  Ht Readings from Last 1 Encounters:  06/30/24 5' 1 (1.549 m)    Weight:  Wt Readings from Last 1 Encounters:  07/01/24 75.4 kg    Ideal Body Weight:  47.7 kg  BMI:  Body mass index is 31.41 kg/m.  Estimated Nutritional Needs:  Kcal:  1500-1700 kcal/d Protein:  80-95 g/d Fluid:  >/=1.5L    Vernell Lukes,  RD, LDN, CNSC Registered Dietitian II Please reach out via secure chat

## 2024-07-01 NOTE — Progress Notes (Signed)
 PHARMACY - ANTICOAGULATION CONSULT NOTE  Pharmacy Consult for IV heparin Indication: atrial fibrillation  Allergies  Allergen Reactions   Ace Inhibitors Swelling   Diphenhydramine Other (See Comments)    Hyperactive/jittery    Doxycycline Hyclate Nausea And Vomiting   Codeine Nausea And Vomiting    Patient Measurements: Height: 5' 1 (154.9 cm) Weight: 75.4 kg (166 lb 3.6 oz) IBW/kg (Calculated) : 47.8 HEPARIN DW (KG): 63.1  Vital Signs: Temp: 97.6 F (36.4 C) (10/27 2112) Temp Source: Oral (10/27 2112) BP: 121/72 (10/27 2112) Pulse Rate: 88 (10/27 2112)  Labs: Recent Labs    06/29/24 0312 06/29/24 0925 06/29/24 1447 06/30/24 0500 06/30/24 0836 07/01/24 0249 07/01/24 1202 07/01/24 2214  HGB 8.5*   < > 9.5*  --  9.9*  --  9.3*  --   HCT 27.8*   < > 28.0*  --  29.0*  --  30.0*  --   PLT 259  --   --   --   --   --  332  --   HEPARINUNFRC  --   --   --   --   --   --   --  >1.10*  CREATININE 1.18*  --   --  1.29*  --  1.14*  --   --    < > = values in this interval not displayed.    Estimated Creatinine Clearance: 39.6 mL/min (A) (by C-G formula based on SCr of 1.14 mg/dL (H)).  Assessment: 78 yo F presenting with pneumonia requiring intubation. Pt now with new afib RVR; no AC PTA. Pharmacy consulted to dose heparin. CBC stable: Hgb 9.3, plt 332.   PM: heparin level supra-therapeutic on 1050 units/hr. Per RN, no issues with the level being drawn (heparin paused for 10 minutes, line flushed twice) or signs/symptoms of bleeding.  Goal of Therapy:  Heparin level 0.3-0.7 units/ml Monitor platelets by anticoagulation protocol: Yes   Plan:  Hold heparin for 1 hour  Re-start heparin infusion at 800 units/hr Check anti-Xa level in 8 hours and daily while on heparin Continue to monitor H&H and platelets  Lynwood Poplar, PharmD, BCPS Clinical Pharmacist 07/01/2024 11:02 PM

## 2024-07-01 NOTE — Progress Notes (Signed)
 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 Code stroke called. On arrival to ED patient emergently intubated for acute respiratory distress and airway protection. CT Head with no ICH or Waller infarct, CTA head and neck with no LVO. CXR concerning for a Pneumonia with visible BL lung apices on CTA demonstrating infection/inflammation. Code stroke cancelled with presentation suspicious for sepsis. Patient received 30ml/kg crystalloid, pan cultured, and started empirically on Cefepime/Vanc/Azithromycin . Of note, family reports patient has recently not felt well and likely has had a cold. PCCM consulted for ICU admission.   On exam in ED, patient febrile to >101 and hypotensive, likely multifactorial in setting of presumed sepsis and recent sedation on induction with underlying heart disease. Norepinephrine gtt was initiated for MAPs < 65 and patient was transferred to ICU with plans to gain central access for vasopressor administration and arterial line placement for hemodynamic monitoring. Pertinent  Medical History   Past Medical History:  Diagnosis Date   Abnormal finding on EKG 09/19/2013   Acute on chronic respiratory failure with hypercapnia (HCC)    Acute respiratory failure (HCC) 06/08/2016   Anxiety 11/09/2020   Arthritis    Asthma    Carotid bruit 11/09/2020   Chronic diastolic CHF (congestive heart failure) (HCC) 07/28/2017   Chronic obstructive pulmonary disease, unspecified (HCC) 11/09/2020   COPD, group D, by  GOLD 2017 classification (HCC) 09/17/2013   Cough 11/09/2020   Decreased estrogen level 11/09/2020   Diabetes (HCC)    Dyspnea    Edema 11/09/2020   Essential hypertension 09/18/2013   Goals of care, counseling/discussion    Hardening of the aorta (main artery of the heart) 11/09/2020   Heart disease    History of radiation therapy    Left Lung- 06/20/23-06/26/23- Dr. 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   Near syncope 09/17/2013   Osteoporosis 11/09/2020   Palliative care encounter    Prediabetes 09/19/2013   Pure hypercholesterolemia 11/09/2020   Raynaud's disease 11/09/2020   Sinus tachycardia 09/18/2013   Skin sensation disturbance 11/09/2020   Smoker 09/17/2013   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   Transient ischemic attack 11/09/2020   Venous stasis of both lower extremities 06/13/2016     Significant Hospital Events: Including procedures, antibiotic start and stop dates in addition to other pertinent events   Found unresponsive at home->ED>Code stroke>intubated for ARD/airway protection>CTH/CTA negative>Code stroke cancelled>Hypotensive>started on pressors>Admit to ICU  Arterial line placed 10/21 10/22 and 10/23 unsuccessful extubation 10/24: Despite maximal Precedex and fentanyl  pushes, unable to safely and comfortably clean the patient up so added prn Versed and started fentanyl  infusion. Rectal tube added for diarrhea.  10/24: 2/3 BC resulted in staph capitis/epidermitis, extubated on BiPAP 10/25 patient remained on BiPAP for 24 hours, when she came off of BiPAP, could not tolerate became tachypneic, tachycardic went into respiratory distress requiring endotracheal reintubation  Interim History / Subjective:  Remains  intubated with failed weaning trial, A-Fib with RVR  Objective    Blood pressure (!) 105/55,  pulse 70, temperature (!) 97.2 F (36.2 C), resp. rate (!) 25, height 5' 1 (1.549 m), weight 75.4 kg, SpO2 98%. CVP:  [11 mmHg-22 mmHg] 11 mmHg  Vent Mode: PRVC FiO2 (%):  [35 %] 35 % Set Rate:  [25 bmp] 25 bmp Vt Set:  [380 mL] 380 mL PEEP:  [5 cmH20] 5 cmH20 Plateau Pressure:  [18 cmH20-26 cmH20] 26 cmH20   Intake/Output Summary (Last 24 hours) at 07/01/2024 0803 Last data filed at 07/01/2024 0700 Gross per 24 hour  Intake 1006.04 ml  Output 740 ml  Net 266.04 ml   Filed Weights   06/29/24 0414 06/30/24 0500 07/01/24 0500  Weight: 78 kg 77.9 kg 75.4 kg    Examination: General: Chronically-ill elderly woman lying in bed, NAD, sedated Lungs: Coarse lung sounds bilaterally. 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 Will need tracheostomy if she fails again, will discuss overall GOC with family.  BC NG after 3 days, Preliminary respiratory gram stain negative  Continue Zosyn Check TTE and will plan A-Fib management accordingly, for now Amio bolus and start heparin gtt Lasix  60 mg X 1, will  Continue Tube Feeds Trend BMP, CBC  Hyperglycemia Increase SSI   Labs   CBC: Recent Labs  Lab 06/25/24 0922 06/25/24 0927 06/26/24 0447 06/27/24 0451 06/28/24 0824 06/28/24 1433 06/28/24 1613 06/29/24 0312 06/29/24 0925 06/29/24 1447 06/30/24 0836  WBC 9.5  --  8.0 9.8 13.0*  --   --  9.1  --   --   --   NEUTROABS 8.6*   --   --   --   --   --   --   --   --   --   --   HGB 10.8*   < > 9.2* 9.2* 9.8*   < > 10.2* 8.5* 9.2* 9.5* 9.9*  HCT 38.4   < > 30.4* 30.4* 32.5*   < > 30.0* 27.8* 27.0* 28.0* 29.0*  MCV 101.9*  --  95.9 95.9 96.7  --   --  96.2  --   --   --   PLT 295  --  202 220 307  --   --  259  --   --   --    < > = values in this interval not displayed.    Basic Metabolic Panel: Recent Labs  Lab 06/26/24 0447 06/27/24 0451 06/27/24 1503 06/28/24 0659 06/28/24 0824 06/28/24 1433 06/29/24 0312 06/29/24 0925 06/29/24 1447 06/30/24 0500 06/30/24 0836 07/01/24 0249  NA 135 139  --   --  140   < > 140 141 140 143 141 142  K 4.7 4.8  --   --  5.0   < > 4.6 4.6 4.5 3.8 3.9 4.0  CL 102 102  --   --  100  --  102  --   --  102  --  103  CO2 27 28  --   --  27  --  29  --   --  27  --  28  GLUCOSE 175* 143*  --   --  252*  --  135*  --   --  103*  --  196*  BUN 23 25*  --   --  35*  --  39*  --   --  44*  --  43*  CREATININE 0.98 1.01*  --   --  1.13*  --  1.18*  --   --  1.29*  --  1.14*  CALCIUM  8.1* 8.2*  --   --  9.1  --  9.0  --   --  8.8*  --  8.7*  MG 2.1  --  2.3 2.4  --   --  2.7*  --   --  2.5*  --   --   PHOS 2.8  --  3.7 3.5  --   --  3.2  --   --  3.0  --   --    < > = values in this interval not displayed.   GFR: Estimated Creatinine Clearance: 39.6 mL/min (A) (by C-G formula based on SCr of 1.14 mg/dL (H)). Recent Labs  Lab 06/25/24 0927 06/26/24 0445 06/26/24 0447 06/27/24 0451 06/28/24 0824 06/29/24 0312 07/01/24 0249  PROCALCITON  --  2.69  --   --   --  0.82 1.61  WBC  --   --  8.0 9.8 13.0* 9.1  --   LATICACIDVEN 1.0  --   --   --   --   --   --     Liver Function Tests: Recent Labs  Lab 06/25/24 0922 06/27/24 0451 06/28/24 0824 06/29/24 0312  AST 24 91* 75* 59*  ALT 11 107* 141* 128*  ALKPHOS 39 37* 40 40  BILITOT 0.6 0.2 0.2 0.4  PROT 7.0 5.7* 6.3* 5.4*  ALBUMIN 3.3* 2.5* 2.8* 2.6*   No results for input(s): LIPASE, AMYLASE in the last 168  hours. No results for input(s): AMMONIA in the last 168 hours.  ABG    Component Value Date/Time   PHART 7.458 (H) 06/30/2024 0836   PCO2ART 41.9 06/30/2024 0836   PO2ART 204 (H) 06/30/2024 0836   HCO3 29.6 (H) 06/30/2024 0836   TCO2 31 06/30/2024 0836   O2SAT 100 06/30/2024 0836     Coagulation Profile: Recent Labs  Lab 06/25/24 0922  INR 0.9    Cardiac Enzymes: No results for input(s): CKTOTAL, CKMB, CKMBINDEX, TROPONINI in the last 168 hours.  HbA1C: Hgb A1c MFr Bld  Date/Time Value Ref Range Status  06/26/2024 04:47 AM 5.6 4.8 - 5.6 % Final    Comment:    (NOTE) Diagnosis of Diabetes The following HbA1c ranges recommended by the American Diabetes Association (ADA) may be used as an aid in the diagnosis of diabetes mellitus.  Hemoglobin             Suggested A1C NGSP%              Diagnosis  <5.7                   Non Diabetic  5.7-6.4                Pre-Diabetic  >6.4                   Diabetic  <7.0                   Glycemic  control for                       adults with diabetes.    09/17/2013 01:57 PM 6.1 (H) <5.7 % Final    Comment:    (NOTE)                                                                       According to the ADA Clinical Practice Recommendations for 2011, when HbA1c is used as a screening test:  >=6.5%   Diagnostic of Diabetes Mellitus           (if abnormal result is confirmed) 5.7-6.4%   Increased risk of developing Diabetes Mellitus References:Diagnosis and Classification of Diabetes Mellitus,Diabetes Care,2011,34(Suppl 1):S62-S69 and Standards of Medical Care in         Diabetes - 2011,Diabetes Care,2011,34 (Suppl 1):S11-S61.    CBG: Recent Labs  Lab 06/30/24 1508 06/30/24 1943 06/30/24 2337 07/01/24 0350 07/01/24 0721  GLUCAP 132* 157* 201* 176* 171*    Review of Systems:     Past Medical History:  She,  has a past medical history of Abnormal finding on EKG (09/19/2013), Acute on chronic  respiratory failure with hypercapnia (HCC), Acute respiratory failure (HCC) (06/08/2016), Anxiety (11/09/2020), Arthritis, Asthma, Carotid bruit (11/09/2020), Chronic diastolic CHF (congestive heart failure) (HCC) (07/28/2017), Chronic obstructive pulmonary disease, unspecified (HCC) (11/09/2020), COPD, group D, by GOLD 2017 classification (HCC) (09/17/2013), Cough (11/09/2020), Decreased estrogen level (11/09/2020), Diabetes (HCC), Dyspnea, Edema (11/09/2020), Essential hypertension (09/18/2013), Goals of care, counseling/discussion, Hardening of the aorta (main artery of the heart) (11/09/2020), Heart disease, History of radiation therapy, Hypertension, Hypertensive heart failure (HCC) (11/09/2020), Hypoxia (11/09/2020), Insomnia (11/09/2020), Iron deficiency anemia (11/09/2020), Waller liver (11/09/2020), Near syncope (09/17/2013), Osteoporosis (11/09/2020), Palliative care encounter, Prediabetes (09/19/2013), Pure hypercholesterolemia (11/09/2020), Raynaud's disease (11/09/2020), Sinus tachycardia (09/18/2013), Skin sensation disturbance (11/09/2020), Smoker (09/17/2013), 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   Surgical History:   Past Surgical History:  Procedure Laterality Date   BRONCHIAL BIOPSY  05/25/2023   Procedure: BRONCHIAL BIOPSIES;  Surgeon: Brenna Adine CROME, DO;  Location: [facility];  Service: Pulmonary;;   BRONCHIAL NEEDLE ASPIRATION BIOPSY  05/25/2023   Procedure: BRONCHIAL NEEDLE ASPIRATION BIOPSIES;  Surgeon: Brenna Adine CROME, DO;  Location: [facility];  Service: Pulmonary;;   FIDUCIAL MARKER PLACEMENT  05/25/2023   Procedure: FIDUCIAL MARKER PLACEMENT;  Surgeon: Brenna Adine CROME, DO;  Location: [facility];  Service: Pulmonary;;   KNEE SURGERY Left      Social History:   reports that she quit smoking about 6 years ago. Her smoking use included cigarettes. She started  smoking about 6 years ago. She has a 55 pack-year smoking history. She has never used smokeless tobacco. She reports that she does not drink alcohol and does not use drugs.   Family History:  Her family history includes Hyperlipidemia in her father and mother; Hypertension in her father and mother.   Allergies Allergies  Allergen Reactions   Ace Inhibitors Swelling   Diphenhydramine Other (See Comments)    Hyperactive/jittery    Doxycycline Hyclate Nausea And Vomiting   Codeine Nausea And Vomiting     Home Medications  Prior to Admission medications   Medication Sig Start Date End Date Taking? Authorizing Provider  acetaminophen  (TYLENOL ) 500 MG tablet Take 1,000 mg by mouth every 6 (six) hours as needed for moderate pain.   Yes [provider]  albuterol  (PROVENTIL  HFA;VENTOLIN  HFA) 108 (90 BASE) MCG/ACT inhaler Inhale 2 puffs into the lungs every 6 (six) hours as needed for wheezing or shortness of breath. 09/19/13  Yes Johnson, Clanford L, MD  aspirin  81 MG chewable tablet Chew 1 tablet (81 mg total) by mouth daily. 09/19/13  Yes Johnson, Clanford L, MD  atorvastatin  (LIPITOR) 10 MG tablet TAKE 1 TABLET BY MOUTH EVERY DAY 07/21/23  Yes Krasowski, Robert J, MD  benzonatate  (TESSALON ) 100 MG capsule Take 100 mg by mouth 3 (three) times daily as needed for cough.    Yes [provider]  Biotin 10 MG CAPS Take 10 mg by mouth daily.   Yes [provider]  budesonide -formoterol  (SYMBICORT ) 160-4.5 MCG/ACT inhaler Inhale 2 puffs into the lungs 2 (two) times daily. 08/23/19  Yes Mannam, Praveen, MD  calcium  carbonate (OSCAL) 1500 (600 Ca) MG TABS tablet Take 600 mg of elemental calcium  by mouth daily.   Yes [provider]  Cholecalciferol (VITAMIN D ) 50 MCG (2000 UT) tablet Take 2,000 Units by mouth daily.   Yes [provider]  diphenhydramine-acetaminophen  (TYLENOL  PM) 25-500 MG TABS tablet Take 1 tablet by mouth at bedtime.   Yes [provider]  furosemide  (LASIX ) 40 MG tablet Take 40 mg by mouth daily.   Yes [provider]  gabapentin (NEURONTIN) 300 MG capsule Take 900 mg by mouth at bedtime.   Yes [provider]  iron polysaccharides (NIFEREX) 150 MG capsule Take 150 mg by mouth daily. 11/05/20  Yes [provider]  loratadine (CLARITIN) 10 MG tablet Take 10 mg by mouth daily as needed for allergies.   Yes [provider]  LORazepam  (ATIVAN ) 1 MG tablet Take 1 tablet (1 mg total) by mouth every 6 (six) hours as needed for anxiety. 08/07/17  Yes Drusilla Sabas RAMAN, MD  losartan  (COZAAR ) 50 MG tablet Take 50 mg by mouth daily.   Yes [provider]  Multiple Vitamin (MULTIVITAMIN WITH MINERALS) TABS tablet Take 1 tablet by mouth daily.   Yes [provider]  raloxifene  (EVISTA ) 60 MG tablet Take 60 mg by mouth every morning. 08/07/20  Yes [provider]  Tiotropium Bromide  Monohydrate (SPIRIVA  RESPIMAT) 2.5 MCG/ACT AERS Inhale 1 puff into the lungs daily.   Yes [provider]  traZODone  (DESYREL ) 100 MG tablet Take 1 tablet (100 mg total) by mouth at bedtime as needed for sleep. 08/07/17  Yes Drusilla Sabas RAMAN, MD    Norman Lobstein, DO

## 2024-07-01 NOTE — Plan of Care (Signed)
  Problem: Clinical Measurements: Goal: Ability to maintain clinical measurements within normal limits will improve Outcome: Progressing Goal: Diagnostic test results will improve Outcome: Progressing Goal: Respiratory complications will improve Outcome: Progressing Goal: Cardiovascular complication will be avoided Outcome: Progressing   Problem: Nutrition: Goal: Adequate nutrition will be maintained Outcome: Progressing   Problem: Elimination: Goal: Will not experience complications related to bowel motility Outcome: Progressing Goal: Will not experience complications related to urinary retention Outcome: Progressing   Problem: Pain Managment: Goal: General experience of comfort will improve and/or be controlled Outcome: Progressing   Problem: Skin Integrity: Goal: Risk for impaired skin integrity will decrease Outcome: Progressing   Problem: Coping: Goal: Ability to adjust to condition or change in health will improve Outcome: Progressing   Problem: Nutritional: Goal: Maintenance of adequate nutrition will improve Outcome: Progressing   Problem: Activity: Goal: Risk for activity intolerance will decrease Outcome: Not Progressing   Problem: Fluid Volume: Goal: Ability to maintain a balanced intake and output will improve Outcome: Not Progressing

## 2024-07-01 NOTE — Progress Notes (Addendum)
 PHARMACY - ANTICOAGULATION CONSULT NOTE  Pharmacy Consult for IV heparin Indication: atrial fibrillation  Allergies  Allergen Reactions   Ace Inhibitors Swelling   Diphenhydramine Other (See Comments)    Hyperactive/jittery    Doxycycline Hyclate Nausea And Vomiting   Codeine Nausea And Vomiting    Patient Measurements: Height: 5' 1 (154.9 cm) Weight: 75.4 kg (166 lb 3.6 oz) IBW/kg (Calculated) : 47.8 HEPARIN DW (KG): 63.1  Vital Signs: Temp: 98.2 F (36.8 C) (10/27 1100) Temp Source: Bladder (10/27 0445) BP: 128/74 (10/27 1100) Pulse Rate: 67 (10/27 1100)  Labs: Recent Labs    06/29/24 0312 06/29/24 0925 06/29/24 1447 06/30/24 0500 06/30/24 0836 07/01/24 0249  HGB 8.5* 9.2* 9.5*  --  9.9*  --   HCT 27.8* 27.0* 28.0*  --  29.0*  --   PLT 259  --   --   --   --   --   CREATININE 1.18*  --   --  1.29*  --  1.14*    Estimated Creatinine Clearance: 39.6 mL/min (A) (by C-G formula based on SCr of 1.14 mg/dL (H)).   Medical History: Past Medical History:  Diagnosis Date   Abnormal finding on EKG 09/19/2013   Acute on chronic respiratory failure with hypercapnia (HCC)    Acute respiratory failure (HCC) 06/08/2016   Anxiety 11/09/2020   Arthritis    Asthma    Carotid bruit 11/09/2020   Chronic diastolic CHF (congestive heart failure) (HCC) 07/28/2017   Chronic obstructive pulmonary disease, unspecified (HCC) 11/09/2020   COPD, group D, by GOLD 2017 classification (HCC) 09/17/2013   Cough 11/09/2020   Decreased estrogen level 11/09/2020   Diabetes (HCC)    Dyspnea    Edema 11/09/2020   Essential hypertension 09/18/2013   Goals of care, counseling/discussion    Hardening of the aorta (main artery of the heart) 11/09/2020   Heart disease    History of radiation therapy    Left Lung- 06/20/23-06/26/23- Dr. Lynwood Nasuti   Hypertension    Hypertensive heart failure (HCC) 11/09/2020   Hypoxia 11/09/2020   Insomnia 11/09/2020   Iron deficiency anemia  11/09/2020   Large liver 11/09/2020   Near syncope 09/17/2013   Osteoporosis 11/09/2020   Palliative care encounter    Prediabetes 09/19/2013   Pure hypercholesterolemia 11/09/2020   Raynaud's disease 11/09/2020   Sinus tachycardia 09/18/2013   Skin sensation disturbance 11/09/2020   Smoker 09/17/2013   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   Transient ischemic attack 11/09/2020   Venous stasis of both lower extremities 06/13/2016    Medications:  Medications Prior to Admission  Medication Sig Dispense Refill Last Dose/Taking   acetaminophen  (TYLENOL ) 500 MG tablet Take 1,000 mg by mouth every 6 (six) hours as needed for moderate pain.   06/24/2024   albuterol  (PROVENTIL  HFA;VENTOLIN  HFA) 108 (90 BASE) MCG/ACT inhaler Inhale 2 puffs into the lungs every 6 (six) hours as needed for wheezing or shortness of breath. 1 Inhaler 0 06/24/2024   aspirin  81 MG chewable tablet Chew 1 tablet (81 mg total) by mouth daily.   06/24/2024   atorvastatin  (LIPITOR) 10 MG tablet TAKE 1 TABLET BY MOUTH EVERY DAY 90 tablet 1 06/24/2024   benzonatate  (TESSALON ) 100 MG capsule Take 100 mg by mouth 3 (three) times daily as needed for cough.    Unknown   Biotin 10 MG CAPS Take 10 mg by mouth daily.   06/24/2024   budesonide -formoterol  (SYMBICORT ) 160-4.5 MCG/ACT inhaler Inhale 2 puffs into the lungs 2 (  two) times daily. 1 Inhaler 4 06/24/2024   calcium  carbonate (OSCAL) 1500 (600 Ca) MG TABS tablet Take 600 mg of elemental calcium  by mouth daily.   06/24/2024   Cholecalciferol (VITAMIN D ) 50 MCG (2000 UT) tablet Take 2,000 Units by mouth daily.   06/24/2024   diphenhydramine-acetaminophen  (TYLENOL  PM) 25-500 MG TABS tablet Take 1 tablet by mouth at bedtime.   06/24/2024   furosemide  (LASIX ) 40 MG tablet Take 40 mg by mouth daily.   06/24/2024   gabapentin (NEURONTIN) 300 MG capsule Take 900 mg by mouth at bedtime.   06/24/2024   iron polysaccharides (NIFEREX) 150 MG capsule Take 150 mg by mouth daily.    06/24/2024   loratadine (CLARITIN) 10 MG tablet Take 10 mg by mouth daily as needed for allergies.   Past Week   LORazepam  (ATIVAN ) 1 MG tablet Take 1 tablet (1 mg total) by mouth every 6 (six) hours as needed for anxiety. 30 tablet 0 Unknown   losartan  (COZAAR ) 50 MG tablet Take 50 mg by mouth daily.   06/24/2024   Multiple Vitamin (MULTIVITAMIN WITH MINERALS) TABS tablet Take 1 tablet by mouth daily.   Unknown   raloxifene  (EVISTA ) 60 MG tablet Take 60 mg by mouth every morning.   06/24/2024   Tiotropium Bromide  Monohydrate (SPIRIVA  RESPIMAT) 2.5 MCG/ACT AERS Inhale 1 puff into the lungs daily.   06/24/2024   traZODone  (DESYREL ) 100 MG tablet Take 1 tablet (100 mg total) by mouth at bedtime as needed for sleep. 10 tablet 0 06/24/2024    Assessment: 78 yo F presenting with pneumonia requiring intubation. Pt now with new afib RVR; no AC PTA. Pharmacy consulted to dose heparin. CBC stable: Hgb 9.3, plt 332.   Goal of Therapy:  Heparin level 0.3-0.7 units/ml Monitor platelets by anticoagulation protocol: Yes   Plan:  Give 3500 units bolus x 1 Start heparin infusion at 14 units/kg/hr Check anti-Xa level in 8 hours and daily while on heparin Continue to monitor H&H and platelets  Katie Waller 07/01/2024,11:08 AM

## 2024-07-01 NOTE — Progress Notes (Signed)
  Echocardiogram 2D Echocardiogram has been performed.  Katie Waller 07/01/2024, 10:32 AM

## 2024-07-02 ENCOUNTER — Telehealth (HOSPITAL_COMMUNITY): Payer: Self-pay | Admitting: Pharmacy Technician

## 2024-07-02 ENCOUNTER — Other Ambulatory Visit (HOSPITAL_COMMUNITY): Payer: Self-pay

## 2024-07-02 DIAGNOSIS — J90 Pleural effusion, not elsewhere classified: Secondary | ICD-10-CM | POA: Diagnosis not present

## 2024-07-02 DIAGNOSIS — J441 Chronic obstructive pulmonary disease with (acute) exacerbation: Secondary | ICD-10-CM | POA: Diagnosis not present

## 2024-07-02 DIAGNOSIS — D649 Anemia, unspecified: Secondary | ICD-10-CM

## 2024-07-02 DIAGNOSIS — J123 Human metapneumovirus pneumonia: Secondary | ICD-10-CM | POA: Diagnosis not present

## 2024-07-02 DIAGNOSIS — J9621 Acute and chronic respiratory failure with hypoxia: Secondary | ICD-10-CM | POA: Diagnosis not present

## 2024-07-02 DIAGNOSIS — N182 Chronic kidney disease, stage 2 (mild): Secondary | ICD-10-CM

## 2024-07-02 LAB — BASIC METABOLIC PANEL WITH GFR
Anion gap: 8 (ref 5–15)
BUN: 44 mg/dL — ABNORMAL HIGH (ref 8–23)
CO2: 30 mmol/L (ref 22–32)
Calcium: 8.5 mg/dL — ABNORMAL LOW (ref 8.9–10.3)
Chloride: 104 mmol/L (ref 98–111)
Creatinine, Ser: 1.1 mg/dL — ABNORMAL HIGH (ref 0.44–1.00)
GFR, Estimated: 52 mL/min — ABNORMAL LOW (ref >60)
Glucose, Bld: 166 mg/dL — ABNORMAL HIGH (ref 70–99)
Potassium: 4.6 mmol/L (ref 3.5–5.1)
Sodium: 142 mmol/L (ref 135–145)

## 2024-07-02 LAB — CBC
HCT: 28.9 % — ABNORMAL LOW (ref 36.0–46.0)
Hemoglobin: 8.6 g/dL — ABNORMAL LOW (ref 12.0–15.0)
MCH: 28.9 pg (ref 26.0–34.0)
MCHC: 29.8 g/dL — ABNORMAL LOW (ref 30.0–36.0)
MCV: 97 fL (ref 80.0–100.0)
Platelets: 374 K/uL (ref 150–400)
RBC: 2.98 MIL/uL — ABNORMAL LOW (ref 3.87–5.11)
RDW: 14 % (ref 11.5–15.5)
WBC: 15 K/uL — ABNORMAL HIGH (ref 4.0–10.5)
nRBC: 7.7 % — ABNORMAL HIGH (ref 0.0–0.2)

## 2024-07-02 LAB — HEPARIN LEVEL (UNFRACTIONATED)
Heparin Unfractionated: >1.1 [IU]/mL — ABNORMAL HIGH (ref 0.30–0.70)
Heparin Unfractionated: >1.1 [IU]/mL — ABNORMAL HIGH (ref 0.30–0.70)

## 2024-07-02 LAB — CULTURE, BLOOD (ROUTINE X 2)
Culture: NO GROWTH
Culture: NO GROWTH
Special Requests: ADEQUATE

## 2024-07-02 LAB — GLUCOSE, CAPILLARY
Glucose-Capillary: 148 mg/dL — ABNORMAL HIGH (ref 70–99)
Glucose-Capillary: 124 mg/dL — ABNORMAL HIGH (ref 70–99)
Glucose-Capillary: 183 mg/dL — ABNORMAL HIGH (ref 70–99)
Glucose-Capillary: 215 mg/dL — ABNORMAL HIGH (ref 70–99)
Glucose-Capillary: 184 mg/dL — ABNORMAL HIGH (ref 70–99)

## 2024-07-02 LAB — MAGNESIUM: Magnesium: 2.4 mg/dL (ref 1.7–2.4)

## 2024-07-02 LAB — APTT: aPTT: 105 s — ABNORMAL HIGH (ref 24–36)

## 2024-07-02 MED ORDER — DEXMEDETOMIDINE HCL IN NACL 400 MCG/100ML IV SOLN
0.0000 ug/kg/h | INTRAVENOUS | Status: DC
  Administered 2024-07-02: 0.4 ug/kg/h via INTRAVENOUS
  Administered 2024-07-03 (×2): 0.6 ug/kg/h via INTRAVENOUS
  Administered 2024-07-03 – 2024-07-04 (×2): 0.7 ug/kg/h via INTRAVENOUS
  Administered 2024-07-04: 0.5 ug/kg/h via INTRAVENOUS
  Administered 2024-07-04: 0.6 ug/kg/h via INTRAVENOUS
  Administered 2024-07-05: 0.5 ug/kg/h via INTRAVENOUS
  Filled 2024-07-02 (×8): qty 100

## 2024-07-02 MED ORDER — SODIUM CHLORIDE 3 % IN NEBU
4.0000 mL | INHALATION_SOLUTION | RESPIRATORY_TRACT | Status: AC
  Administered 2024-07-02 – 2024-07-05 (×14): 4 mL via RESPIRATORY_TRACT
  Filled 2024-07-02 (×12): qty 15

## 2024-07-02 MED ORDER — HEPARIN (PORCINE) 25000 UT/250ML-% IV SOLN: 550.0000 [IU]/h | INTRAVENOUS | Status: DC

## 2024-07-02 MED ORDER — LORAZEPAM 1 MG PO TABS
1.0000 mg | ORAL_TABLET | Freq: Three times a day (TID) | ORAL | Status: DC
  Administered 2024-07-02 – 2024-07-05 (×9): 1 mg
  Filled 2024-07-02 (×9): qty 1

## 2024-07-02 MED ORDER — POLYETHYLENE GLYCOL 3350 17 G PO PACK: 17.0000 g | PACK | Freq: Every day | ORAL | Status: DC | PRN

## 2024-07-02 MED ORDER — FUROSEMIDE 10 MG/ML IJ SOLN
80.0000 mg | Freq: Three times a day (TID) | INTRAMUSCULAR | Status: AC
  Administered 2024-07-02 (×2): 80 mg via INTRAVENOUS
  Filled 2024-07-02 (×2): qty 8

## 2024-07-02 MED ORDER — METOPROLOL TARTRATE 25 MG PO TABS
25.0000 mg | ORAL_TABLET | Freq: Two times a day (BID) | ORAL | Status: DC
  Administered 2024-07-02 – 2024-07-05 (×7): 25 mg
  Filled 2024-07-02 (×7): qty 1

## 2024-07-02 MED ORDER — INSULIN GLARGINE-YFGN 100 UNIT/ML ~~LOC~~ SOLN
5.0000 [IU] | Freq: Every day | SUBCUTANEOUS | Status: DC
  Administered 2024-07-02: 5 [IU] via SUBCUTANEOUS
  Filled 2024-07-02 (×2): qty 0.05

## 2024-07-02 MED ORDER — HEPARIN (PORCINE) 25000 UT/250ML-% IV SOLN
400.0000 [IU]/h | INTRAVENOUS | Status: DC
  Administered 2024-07-02: 350 [IU]/h via INTRAVENOUS
  Filled 2024-07-02: qty 250

## 2024-07-02 NOTE — Progress Notes (Signed)
 PHARMACY - ANTICOAGULATION CONSULT NOTE  Pharmacy Consult for IV heparin Indication: atrial fibrillation  Allergies  Allergen Reactions   Ace Inhibitors Swelling   Diphenhydramine Other (See Comments)    Hyperactive/jittery    Doxycycline Hyclate Nausea And Vomiting   Codeine Nausea And Vomiting    Patient Measurements: Height: 5' 1 (154.9 cm) Weight: 75.4 kg (166 lb 3.6 oz) IBW/kg (Calculated) : 47.8 HEPARIN DW (KG): 63.1  Vital Signs: Temp: 96.5 F (35.8 C) (10/28 1545) Temp Source: Axillary (10/28 1545) BP: 103/71 (10/28 1900) Pulse Rate: 83 (10/28 1900)  Labs: Recent Labs    06/30/24 0500 06/30/24 0836 06/30/24 0836 07/01/24 0249 07/01/24 1202 07/01/24 2214 07/02/24 0434 07/02/24 0851 07/02/24 1048 07/02/24 1815  HGB  --  9.9*   < >  --  9.3*  --   --   --  8.6*  --   HCT  --  29.0*  --   --  30.0*  --   --   --  28.9*  --   PLT  --   --   --   --  332  --   --   --  374  --   HEPARINUNFRC  --   --   --   --   --  >1.10*  --  >1.10*  --  >1.10*  CREATININE 1.29*  --   --  1.14*  --   --  1.10*  --   --   --    < > = values in this interval not displayed.    Estimated Creatinine Clearance: 41 mL/min (A) (by C-G formula based on SCr of 1.1 mg/dL (H)).  Assessment: 77 yo F presenting with pneumonia requiring intubation. Pt now with new afib RVR; no AC PTA. Pharmacy consulted to dose heparin.  Heparin level remains supra-therapeutic at >1.1 on 800 units/hr. Per RN, level was not drawn from the line. No signs/symptoms of bleeding. Most recent CBC stable: Hgb 9.5, plt 332.   Heparin level came back supratherapeutic again. It was collected appropriately. We will hold and check again.   Goal of Therapy:  Heparin level 0.3-0.7 units/ml Monitor platelets by anticoagulation protocol: Yes   Plan:  Hold heparin 1 hour Re-start heparin infusion after holding at decreased rate of 350 units/hr Check anti-Xa level in 8 hours and daily while on heparin Continue to  monitor H&H and platelets  Sergio Batch, PharmD, BCIDP, AAHIVP, CPP Infectious Disease Pharmacist 07/02/2024 7:50 PM

## 2024-07-02 NOTE — TOC Progression Note (Signed)
 Transition of Care [facility]) - Progression Note    Patient Details  Name: Katie Waller MRN: [id number] Date of Birth: 10/16/1945  Transition of Care [facility]) CM/SW Contact  Lauraine FORBES Saa, LCSWA Phone Number: 07/02/2024, 1:08 PM  Clinical Narrative:     1:08 PM Per progressions, patient remains intubated with feeding tube. TOC will continue to follow.  Expected Discharge Plan: Home/Self Care Barriers to Discharge: Continued Medical Work up               Expected Discharge Plan and Services       Living arrangements for the past 2 months: Single Family Home                                       Social Drivers of Health (SDOH) Interventions SDOH Screenings   Food Insecurity: No Food Insecurity (06/05/2023)  Housing: Low Risk  (06/05/2023)  Transportation Needs: No Transportation Needs (06/05/2023)  Utilities: Not At Risk (06/05/2023)  Depression (PHQ2-9): Low Risk  (06/05/2023)  Tobacco Use: Medium Risk (06/25/2024)    Readmission Risk Interventions     No data to display

## 2024-07-02 NOTE — Progress Notes (Signed)
 NAMEROBBIN Waller, MRN:  [id number], DOB:  05-08-1946, LOS: 7 ADMISSION DATE:  06/25/2024, CONSULTATION DATE:  06/25/2024 REFERRING MD:  Theodoro SAUNDERS, CHIEF COMPLAINT:  AMS, Sepsis, ARF  History of Present Illness:  Katie Waller is a 78 yo female with past medical history significant for HTN, COPD, acute on chronic hypoxic, hypercapnic respiratory failure on 5-6L [place] baseline, diastolic heart failure, SCC Left lower lobe s/p radiation in September 2025, who presented via EMS after being found unresponsive at home. Code stroke called. On arrival to ED patient emergently intubated for acute respiratory distress and airway protection. CT Head with no ICH or large infarct, CTA head and neck with no LVO. CXR concerning for a Pneumonia with visible BL lung apices on CTA demonstrating infection/inflammation. Code stroke cancelled with presentation suspicious for sepsis. Patient received 30ml/kg crystalloid, pan cultured, and started empirically on Cefepime/Vanc/Azithromycin . Of note, family reports patient has recently not felt well and likely has had a cold. PCCM consulted for ICU admission.   On exam in ED, patient febrile to >101 and hypotensive, likely multifactorial in setting of presumed sepsis and recent sedation on induction with underlying heart disease. Norepinephrine gtt was initiated for MAPs < 65 and patient was transferred to ICU with plans to gain central access for vasopressor administration and arterial line placement for hemodynamic monitoring.  Pertinent  Medical History  Per above.  Significant Hospital Events: Including procedures, antibiotic start and stop dates in addition to other pertinent events   Found unresponsive at home->ED>Code stroke>intubated for ARD/airway protection>CTH/CTA negative>Code stroke cancelled>Hypotensive>started on pressors>Admit to ICU  Arterial line placed 10/21 10/22 and 10/23 unsuccessful extubation 10/24: Despite maximal Precedex and fentanyl   pushes, unable to safely and comfortably clean the patient up so added prn Versed and started fentanyl  infusion. Rectal tube added for diarrhea.  10/24: 2/3 BC resulted in staph capitis/epidermitis, extubated on BiPAP 10/25 patient remained on BiPAP for 24 hours, when she came off of BiPAP, could not tolerate became tachypneic, tachycardic went into respiratory distress requiring endotracheal reintubation   Interim History / Subjective:  Rates/BP improved since starting Lopressor  yesterday.  Objective    Blood pressure (!) 126/59, pulse 83, temperature 98.3 F (36.8 C), temperature source Oral, resp. rate (!) 25, height 5' 1 (1.549 m), weight 75.4 kg, SpO2 100%. CVP:  [3 mmHg-23 mmHg] 8 mmHg  Vent Mode: PRVC FiO2 (%):  [30 %-35 %] 30 % Set Rate:  [25 bmp] 25 bmp Vt Set:  [380 mL] 380 mL PEEP:  [5 cmH20] 5 cmH20 Plateau Pressure:  [24 cmH20-26 cmH20] 24 cmH20   Intake/Output Summary (Last 24 hours) at 07/02/2024 0804 Last data filed at 07/02/2024 0700 Gross per 24 hour  Intake 2445.51 ml  Output 650 ml  Net 1795.51 ml   Filed Weights   06/29/24 0414 06/30/24 0500 07/01/24 0500  Weight: 78 kg 77.9 kg 75.4 kg    Examination: General: Chronically-ill elderly woman lying in bed, NAD, sedated Lungs: Coarse lung sounds bilaterally. 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 Will need tracheostomy if she fails again, for  now will optimize her from a  respiratory/cardiac standpoint. Plan to attempt one more weaning trial tomorrow. For now: Zosyn per Pharmacy IV Lasix  80 mg X 2, check bladder scan, I/O's LTVV/VAP prevention/PAD protocol Nebulized Triple Therapy, hypertonic NS CPT for secretions Continue Lopressor  12.5 BID for A-Fib, on heparin gtt Continue Prednisone  taper Continue TF Repeat BC NG after 5 days, Tracheal aspirate NGTD, repeat pending  Trend BMP, CBC, Triglycerides (Propofol )   Hyperglycemia Within goal. Continue SSI  Labs   CBC: Recent Labs  Lab 06/25/24 0922 06/25/24 0927 06/26/24 0447 06/27/24 0451 06/28/24 0824 06/28/24 1433 06/29/24 0312 06/29/24 0925 06/29/24 1447 06/30/24 0836 07/01/24 1202  WBC 9.5  --  8.0 9.8 13.0*  --  9.1  --   --   --  12.8*  NEUTROABS 8.6*  --   --   --   --   --   --   --   --   --   --   HGB 10.8*   < > 9.2* 9.2* 9.8*   < > 8.5* 9.2* 9.5* 9.9* 9.3*  HCT 38.4   < > 30.4* 30.4* 32.5*   < > 27.8* 27.0* 28.0* 29.0* 30.0*  MCV 101.9*  --  95.9 95.9 96.7  --  96.2  --   --   --  94.0  PLT 295  --  202 220 307  --  259  --   --   --  332   < > = values in this interval not displayed.    Basic Metabolic Panel: Recent Labs  Lab 06/26/24 0447 06/27/24 0451 06/27/24 1503 06/28/24 0659 06/28/24 0824 06/28/24 1433 06/29/24 0312 06/29/24 0925 06/29/24 1447 06/30/24 0500 06/30/24 0836 07/01/24 0249 07/02/24 0434  NA 135   < >  --   --  140   < > 140   < > 140 143 141 142 142  K 4.7   < >  --   --  5.0   < > 4.6   < > 4.5 3.8 3.9 4.0 4.6  CL 102   < >  --   --  100  --  102  --   --  102  --  103 104  CO2 27   < >  --   --  27  --  29  --   --  27  --  28 30  GLUCOSE 175*   < >  --   --  252*  --  135*  --   --  103*  --  196* 166*  BUN 23   < >  --   --  35*  --  39*  --   --  44*  --  43* 44*  CREATININE 0.98   < >  --   --  1.13*  --  1.18*  --   --  1.29*  --  1.14* 1.10*  CALCIUM  8.1*   < >  --   --  9.1  --  9.0  --   --  8.8*  --  8.7* 8.5*  MG 2.1  --  2.3 2.4   --   --  2.7*  --   --  2.5*  --   --  2.4  PHOS 2.8  --  3.7 3.5  --   --  3.2  --   --  3.0  --   --   --    < > =  values in this interval not displayed.   GFR: Estimated Creatinine Clearance: 41 mL/min (A) (by C-G formula based on SCr of 1.1 mg/dL (H)). Recent Labs  Lab 06/25/24 0927 06/26/24 0445 06/26/24 0447 06/27/24 0451 06/28/24 0824 06/29/24 0312 07/01/24 0249 07/01/24 1202  PROCALCITON  --  2.69  --   --   --  0.82 1.61  --   WBC  --   --    < > 9.8 13.0* 9.1  --  12.8*  LATICACIDVEN 1.0  --   --   --   --   --   --   --    < > = values in this interval not displayed.    Liver Function Tests: Recent Labs  Lab 06/25/24 0922 06/27/24 0451 06/28/24 0824 06/29/24 0312  AST 24 91* 75* 59*  ALT 11 107* 141* 128*  ALKPHOS 39 37* 40 40  BILITOT 0.6 0.2 0.2 0.4  PROT 7.0 5.7* 6.3* 5.4*  ALBUMIN 3.3* 2.5* 2.8* 2.6*   No results for input(s): LIPASE, AMYLASE in the last 168 hours. No results for input(s): AMMONIA in the last 168 hours.  ABG    Component Value Date/Time   PHART 7.458 (H) 06/30/2024 0836   PCO2ART 41.9 06/30/2024 0836   PO2ART 204 (H) 06/30/2024 0836   HCO3 29.6 (H) 06/30/2024 0836   TCO2 31 06/30/2024 0836   O2SAT 100 06/30/2024 0836     Coagulation Profile: Recent Labs  Lab 06/25/24 0922  INR 0.9    Cardiac Enzymes: No results for input(s): CKTOTAL, CKMB, CKMBINDEX, TROPONINI in the last 168 hours.  HbA1C: Hgb A1c MFr Bld  Date/Time Value Ref Range Status  06/26/2024 04:47 AM 5.6 4.8 - 5.6 % Final    Comment:    (NOTE) Diagnosis of Diabetes The following HbA1c ranges recommended by the American Diabetes Association (ADA) may be used as an aid in the diagnosis of diabetes mellitus.  Hemoglobin             Suggested A1C NGSP%              Diagnosis  <5.7                   Non Diabetic  5.7-6.4                Pre-Diabetic  >6.4                   Diabetic  <7.0                   Glycemic control for                        adults with diabetes.    09/17/2013 01:57 PM 6.1 (H) <5.7 % Final    Comment:    (NOTE)                                                                       According to the ADA Clinical Practice Recommendations for 2011, when HbA1c is used as a screening test:  >=6.5%   Diagnostic of Diabetes Mellitus           (if abnormal result  is confirmed) 5.7-6.4%   Increased risk of developing Diabetes Mellitus References:Diagnosis and Classification of Diabetes Mellitus,Diabetes Care,2011,34(Suppl 1):S62-S69 and Standards of Medical Care in         Diabetes - 2011,Diabetes Care,2011,34 (Suppl 1):S11-S61.    CBG: Recent Labs  Lab 07/01/24 1620 07/01/24 2058 07/01/24 2348 07/02/24 0414 07/02/24 0732  GLUCAP 216* 151* 177* 148* 124*    Review of Systems:     Past Medical History:  She,  has a past medical history of Abnormal finding on EKG (09/19/2013), Acute on chronic respiratory failure with hypercapnia (HCC), Acute respiratory failure (HCC) (06/08/2016), Anxiety (11/09/2020), Arthritis, Asthma, Carotid bruit (11/09/2020), Chronic diastolic CHF (congestive heart failure) (HCC) (07/28/2017), Chronic obstructive pulmonary disease, unspecified (HCC) (11/09/2020), COPD, group D, by GOLD 2017 classification (HCC) (09/17/2013), Cough (11/09/2020), Decreased estrogen level (11/09/2020), Diabetes (HCC), Dyspnea, Edema (11/09/2020), Essential hypertension (09/18/2013), Goals of care, counseling/discussion, Hardening of the aorta (main artery of the heart) (11/09/2020), Heart disease, History of radiation therapy, Hypertension, Hypertensive heart failure (HCC) (11/09/2020), Hypoxia (11/09/2020), Insomnia (11/09/2020), Iron deficiency anemia (11/09/2020), Large liver (11/09/2020), Near syncope (09/17/2013), Osteoporosis (11/09/2020), Palliative care encounter, Prediabetes (09/19/2013), Pure hypercholesterolemia (11/09/2020), Raynaud's disease (11/09/2020), Sinus tachycardia (09/18/2013), Skin  sensation disturbance (11/09/2020), Smoker (09/17/2013), 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   Surgical History:   Past Surgical History:  Procedure Laterality Date   BRONCHIAL BIOPSY  05/25/2023   Procedure: BRONCHIAL BIOPSIES;  Surgeon: Brenna Adine CROME, DO;  Location: [facility] END[facility];  Service: Pulmonary;;   BRONCHIAL NEEDLE ASPIRATION BIOPSY  05/25/2023   Procedure: BRONCHIAL NEEDLE ASPIRATION BIOPSIES;  Surgeon: Brenna Adine CROME, DO;  Location: [facility] ENDOS[facility];  Service: Pulmonary;;   FIDUCIAL MARKER PLACEMENT  05/25/2023   Procedure: FIDUCIAL MARKER PLACEMENT;  Surgeon: Brenna Adine CROME, DO;  Location: [facility] ENDOS[facility];  Service: Pulmonary;;   KNEE SURGERY Left      Social History:   reports that she quit smoking about 6 years ago. Her smoking use included cigarettes. She started smoking about 6 years ago. She has a 55 pack-year smoking history. She has never used smokeless tobacco. She reports that she does not drink alcohol and does not use drugs.   Family History:  Her family history includes Hyperlipidemia in her father and mother; Hypertension in her father and mother.   Allergies Allergies  Allergen Reactions   Ace Inhibitors Swelling   Diphenhydramine Other (See Comments)    Hyperactive/jittery    Doxycycline Hyclate Nausea And Vomiting   Codeine Nausea And Vomiting     Home Medications  Prior to Admission medications   Medication Sig Start Date End Date Taking? Authorizing Provider  acetaminophen  (TYLENOL ) 500 MG tablet Take 1,000 mg by mouth every 6 (six) hours as needed for moderate pain.   Yes [provider]  albuterol  (PROVENTIL  HFA;VENTOLIN  HFA) 108 (90 BASE) MCG/ACT inhaler Inhale 2 puffs into the lungs every 6 (six) hours as needed for wheezing or shortness of breath. 09/19/13  Yes Johnson, Clanford L, MD  aspirin  81 MG chewable tablet Chew 1 tablet (81 mg  total) by mouth daily. 09/19/13  Yes Johnson, Clanford L, MD  atorvastatin  (LIPITOR) 10 MG tablet TAKE 1 TABLET BY MOUTH EVERY DAY 07/21/23  Yes Krasowski, Robert J, MD  benzonatate  (TESSALON ) 100 MG capsule Take 100 mg by mouth 3 (three) times daily as needed for cough.    Yes [provider]  Biotin 10 MG CAPS Take 10 mg by mouth  daily.   Yes [provider]  budesonide -formoterol  (SYMBICORT ) 160-4.5 MCG/ACT inhaler Inhale 2 puffs into the lungs 2 (two) times daily. 08/23/19  Yes Mannam, Praveen, MD  calcium  carbonate (OSCAL) 1500 (600 Ca) MG TABS tablet Take 600 mg of elemental calcium  by mouth daily.   Yes [provider]  Cholecalciferol (VITAMIN D ) 50 MCG (2000 UT) tablet Take 2,000 Units by mouth daily.   Yes [provider]  diphenhydramine-acetaminophen  (TYLENOL  PM) 25-500 MG TABS tablet Take 1 tablet by mouth at bedtime.   Yes [provider]  furosemide  (LASIX ) 40 MG tablet Take 40 mg by mouth daily.   Yes [provider]  gabapentin (NEURONTIN) 300 MG capsule Take 900 mg by mouth at bedtime.   Yes [provider]  iron polysaccharides (NIFEREX) 150 MG capsule Take 150 mg by mouth daily. 11/05/20  Yes [provider]  loratadine (CLARITIN) 10 MG tablet Take 10 mg by mouth daily as needed for allergies.   Yes [provider]  LORazepam  (ATIVAN ) 1 MG tablet Take 1 tablet (1 mg total) by mouth every 6 (six) hours as needed for anxiety. 08/07/17  Yes Drusilla Sabas RAMAN, MD  losartan  (COZAAR ) 50 MG tablet Take 50 mg by mouth daily.   Yes [provider]  Multiple Vitamin (MULTIVITAMIN WITH MINERALS) TABS tablet Take 1 tablet by mouth daily.   Yes [provider]  raloxifene  (EVISTA ) 60 MG tablet Take 60 mg by mouth every morning. 08/07/20  Yes [provider]  Tiotropium Bromide  Monohydrate (SPIRIVA  RESPIMAT) 2.5 MCG/ACT AERS Inhale 1 puff into the lungs daily.   Yes [provider]   traZODone  (DESYREL ) 100 MG tablet Take 1 tablet (100 mg total) by mouth at bedtime as needed for sleep. 08/07/17  Yes Drusilla Sabas RAMAN, MD    Norman Lobstein, DO

## 2024-07-02 NOTE — Progress Notes (Signed)
 Sputum trap placed in-line with ventilator awaiting sample.

## 2024-07-02 NOTE — Plan of Care (Signed)
  Problem: Clinical Measurements: Goal: Ability to maintain clinical measurements within normal limits will improve Outcome: Progressing   Problem: Clinical Measurements: Goal: Ability to maintain clinical measurements within normal limits will improve Outcome: Progressing   

## 2024-07-02 NOTE — Telephone Encounter (Signed)
 Patient Product/process development scientist completed.    The patient is insured through [facility]. Patient has Medicare and is not eligible for a copay card, but may be able to apply for patient assistance or Medicare RX Payment Plan (Patient Must reach out to their plan, if eligible for payment plan), if available.    Ran test claim for Eliquis 5 mg and the current 30 day co-pay is $0.00.  Ran test claim for Xarelto  20 mg and the current 30 day co-pay is $0.00.  This test claim was processed through [facility]- copay amounts may vary at other pharmacies due to pharmacy/plan contracts, or as the patient moves through the different stages of their insurance plan.     Reyes Sharps, CPHT Pharmacy Technician Patient Advocate Specialist Lead [facility] Patient Advocate Team Direct Number: [phone]  Fax: [phone]

## 2024-07-02 NOTE — Progress Notes (Addendum)
 PHARMACY - ANTICOAGULATION CONSULT NOTE  Pharmacy Consult for IV heparin Indication: atrial fibrillation  Allergies  Allergen Reactions   Ace Inhibitors Swelling   Diphenhydramine Other (See Comments)    Hyperactive/jittery    Doxycycline Hyclate Nausea And Vomiting   Codeine Nausea And Vomiting    Patient Measurements: Height: 5' 1 (154.9 cm) Weight: 75.4 kg (166 lb 3.6 oz) IBW/kg (Calculated) : 47.8 HEPARIN DW (KG): 63.1  Vital Signs: Temp: 98.3 F (36.8 C) (10/28 0730) Temp Source: Oral (10/28 0730) BP: 124/73 (10/28 0930) Pulse Rate: 98 (10/28 0930)  Labs: Recent Labs    06/29/24 1447 06/30/24 0500 06/30/24 0836 07/01/24 0249 07/01/24 1202 07/01/24 2214 07/02/24 0434 07/02/24 0851  HGB 9.5*  --  9.9*  --  9.3*  --   --   --   HCT 28.0*  --  29.0*  --  30.0*  --   --   --   PLT  --   --   --   --  332  --   --   --   HEPARINUNFRC  --   --   --   --   --  >1.10*  --  >1.10*  CREATININE  --  1.29*  --  1.14*  --   --  1.10*  --     Estimated Creatinine Clearance: 41 mL/min (A) (by C-G formula based on SCr of 1.1 mg/dL (H)).  Assessment: 78 yo F presenting with pneumonia requiring intubation. Pt now with new afib RVR; no AC PTA. Pharmacy consulted to dose heparin.  Heparin level remains supra-therapeutic at >1.1 on 800 units/hr. Per RN, level was not drawn from the line. No signs/symptoms of bleeding. Most recent CBC stable: Hgb 9.5, plt 332.   Goal of Therapy:  Heparin level 0.3-0.7 units/ml Monitor platelets by anticoagulation protocol: Yes   Plan:  Heparin held for 1 hour starting at 0940 Re-start heparin infusion after holding at decreased rate of 550 units/hr Check anti-Xa level in 8 hours and daily while on heparin Continue to monitor H&H and platelets  Elma Fail, PharmD PGY1 Clinical Pharmacist [facility]  07/02/2024 10:40 AM

## 2024-07-03 DIAGNOSIS — J90 Pleural effusion, not elsewhere classified: Secondary | ICD-10-CM | POA: Diagnosis not present

## 2024-07-03 DIAGNOSIS — J123 Human metapneumovirus pneumonia: Secondary | ICD-10-CM | POA: Diagnosis not present

## 2024-07-03 DIAGNOSIS — J441 Chronic obstructive pulmonary disease with (acute) exacerbation: Secondary | ICD-10-CM | POA: Diagnosis not present

## 2024-07-03 DIAGNOSIS — R197 Diarrhea, unspecified: Secondary | ICD-10-CM

## 2024-07-03 DIAGNOSIS — J9621 Acute and chronic respiratory failure with hypoxia: Secondary | ICD-10-CM | POA: Diagnosis not present

## 2024-07-03 DIAGNOSIS — R739 Hyperglycemia, unspecified: Secondary | ICD-10-CM

## 2024-07-03 LAB — CULTURE, RESPIRATORY W GRAM STAIN: Culture: NO GROWTH

## 2024-07-03 LAB — GLUCOSE, CAPILLARY
Glucose-Capillary: 121 mg/dL — ABNORMAL HIGH (ref 70–99)
Glucose-Capillary: 171 mg/dL — ABNORMAL HIGH (ref 70–99)
Glucose-Capillary: 182 mg/dL — ABNORMAL HIGH (ref 70–99)
Glucose-Capillary: 197 mg/dL — ABNORMAL HIGH (ref 70–99)
Glucose-Capillary: 199 mg/dL — ABNORMAL HIGH (ref 70–99)
Glucose-Capillary: 222 mg/dL — ABNORMAL HIGH (ref 70–99)
Glucose-Capillary: 237 mg/dL — ABNORMAL HIGH (ref 70–99)

## 2024-07-03 LAB — APTT
aPTT: 50 s — ABNORMAL HIGH (ref 24–36)
aPTT: 56 s — ABNORMAL HIGH (ref 24–36)

## 2024-07-03 LAB — CBC
HCT: 28 % — ABNORMAL LOW (ref 36.0–46.0)
Hemoglobin: 8.6 g/dL — ABNORMAL LOW (ref 12.0–15.0)
MCH: 29.1 pg (ref 26.0–34.0)
MCHC: 30.7 g/dL (ref 30.0–36.0)
MCV: 94.6 fL (ref 80.0–100.0)
Platelets: 379 K/uL (ref 150–400)
RBC: 2.96 MIL/uL — ABNORMAL LOW (ref 3.87–5.11)
RDW: 13.8 % (ref 11.5–15.5)
WBC: 12.4 K/uL — ABNORMAL HIGH (ref 4.0–10.5)
nRBC: 6.8 % — ABNORMAL HIGH (ref 0.0–0.2)

## 2024-07-03 LAB — BASIC METABOLIC PANEL WITH GFR
Anion gap: 13 (ref 5–15)
BUN: 44 mg/dL — ABNORMAL HIGH (ref 8–23)
CO2: 33 mmol/L — ABNORMAL HIGH (ref 22–32)
Calcium: 9.2 mg/dL (ref 8.9–10.3)
Chloride: 97 mmol/L — ABNORMAL LOW (ref 98–111)
Creatinine, Ser: 1.09 mg/dL — ABNORMAL HIGH (ref 0.44–1.00)
GFR, Estimated: 53 mL/min — ABNORMAL LOW (ref >60)
Glucose, Bld: 239 mg/dL — ABNORMAL HIGH (ref 70–99)
Potassium: 4.4 mmol/L (ref 3.5–5.1)
Sodium: 143 mmol/L (ref 135–145)

## 2024-07-03 LAB — HEPARIN LEVEL (UNFRACTIONATED)
Heparin Unfractionated: 0.49 [IU]/mL (ref 0.30–0.70)
Heparin Unfractionated: 0.32 [IU]/mL (ref 0.30–0.70)

## 2024-07-03 LAB — TRIGLYCERIDES: Triglycerides: 126 mg/dL (ref <150)

## 2024-07-03 MED ORDER — IPRATROPIUM-ALBUTEROL 0.5-2.5 (3) MG/3ML IN SOLN
RESPIRATORY_TRACT | Status: AC
  Filled 2024-07-03: qty 3

## 2024-07-03 MED ORDER — AMIODARONE IV BOLUS ONLY 150 MG/100ML
INTRAVENOUS | Status: AC
  Filled 2024-07-03: qty 100

## 2024-07-03 MED ORDER — FUROSEMIDE 10 MG/ML IJ SOLN
80.0000 mg | Freq: Once | INTRAMUSCULAR | Status: AC
Start: 2024-07-03 — End: 2024-07-03
  Administered 2024-07-03: 80 mg via INTRAVENOUS
  Filled 2024-07-03: qty 8

## 2024-07-03 MED ORDER — AMIODARONE IV BOLUS ONLY 150 MG/100ML
150.0000 mg | Freq: Once | INTRAVENOUS | Status: AC
  Administered 2024-07-03: 150 mg via INTRAVENOUS
  Filled 2024-07-03: qty 100

## 2024-07-03 MED ORDER — INSULIN ASPART 100 UNIT/ML IJ SOLN
0.0000 [IU] | INTRAMUSCULAR | Status: DC
  Administered 2024-07-03: 3 [IU] via SUBCUTANEOUS
  Administered 2024-07-03 (×2): 2 [IU] via SUBCUTANEOUS
  Administered 2024-07-04: 1 [IU] via SUBCUTANEOUS
  Administered 2024-07-04 (×3): 2 [IU] via SUBCUTANEOUS
  Administered 2024-07-05 (×4): 1 [IU] via SUBCUTANEOUS
  Administered 2024-07-09: 2 [IU] via SUBCUTANEOUS
  Administered 2024-07-09: 5 [IU] via SUBCUTANEOUS
  Administered 2024-07-09: 2 [IU] via SUBCUTANEOUS
  Filled 2024-07-03 (×2): qty 2

## 2024-07-03 MED ORDER — IPRATROPIUM-ALBUTEROL 0.5-2.5 (3) MG/3ML IN SOLN
3.0000 mL | RESPIRATORY_TRACT | Status: DC | PRN
  Administered 2024-07-03 – 2024-07-18 (×18): 3 mL via RESPIRATORY_TRACT
  Filled 2024-07-03 (×17): qty 3

## 2024-07-03 MED ORDER — INSULIN GLARGINE-YFGN 100 UNIT/ML ~~LOC~~ SOLN
8.0000 [IU] | Freq: Every day | SUBCUTANEOUS | Status: DC
  Administered 2024-07-03 – 2024-07-05 (×3): 8 [IU] via SUBCUTANEOUS
  Filled 2024-07-03 (×4): qty 0.08

## 2024-07-03 MED ORDER — SODIUM CHLORIDE 0.9 % IV SOLN
0.1500 mg/kg/h | INTRAVENOUS | Status: DC
  Administered 2024-07-03: 0.1 mg/kg/h via INTRAVENOUS
  Administered 2024-07-05: 0.13 mg/kg/h via INTRAVENOUS
  Administered 2024-07-05 – 2024-07-08 (×4): 0.15 mg/kg/h via INTRAVENOUS
  Filled 2024-07-03 (×8): qty 250

## 2024-07-03 MED ORDER — BANATROL TF EN LIQD
60.0000 mL | Freq: Three times a day (TID) | ENTERAL | Status: DC
  Administered 2024-07-03 – 2024-07-04 (×3): 60 mL
  Filled 2024-07-03 (×3): qty 60

## 2024-07-03 MED ORDER — AMIODARONE HCL IN DEXTROSE 360-4.14 MG/200ML-% IV SOLN
30.0000 mg/h | INTRAVENOUS | Status: DC
  Administered 2024-07-03 – 2024-07-05 (×4): 30 mg/h via INTRAVENOUS
  Filled 2024-07-03 (×5): qty 200

## 2024-07-03 MED ORDER — NUTRISOURCE FIBER PO PACK
1.0000 | PACK | Freq: Two times a day (BID) | ORAL | Status: DC
  Filled 2024-07-03 (×2): qty 1

## 2024-07-03 MED ORDER — AMIODARONE HCL IN DEXTROSE 360-4.14 MG/200ML-% IV SOLN
60.0000 mg/h | INTRAVENOUS | Status: AC
  Administered 2024-07-03: 60 mg/h via INTRAVENOUS

## 2024-07-03 NOTE — Progress Notes (Signed)
 PHARMACY - ANTICOAGULATION CONSULT NOTE  Pharmacy Consult for IV bivalirudin Indication: atrial fibrillation  Allergies  Allergen Reactions   Ace Inhibitors Swelling   Diphenhydramine Other (See Comments)    Hyperactive/jittery    Doxycycline Hyclate Nausea And Vomiting   Codeine Nausea And Vomiting    Patient Measurements: Height: 5' 1 (154.9 cm) Weight: 75.4 kg (166 lb 3.6 oz) IBW/kg (Calculated) : 47.8 HEPARIN DW (KG): 63.1  Vital Signs: Temp: 98.2 F (36.8 C) (10/29 1230) Temp Source: Axillary (10/29 1230) BP: 106/69 (10/29 1300) Pulse Rate: 106 (10/29 1300)  Labs: Recent Labs     0000 07/01/24 0249 07/01/24 1202 07/01/24 2214 07/02/24 0434 07/02/24 0851 07/02/24 1048 07/02/24 1815 07/03/24 0551 07/03/24 1328  HGB   < >  --  9.3*  --   --   --  8.6*  --  8.6*  --   HCT  --   --  30.0*  --   --   --  28.9*  --  28.0*  --   PLT  --   --  332  --   --   --  374  --  379  --   APTT  --   --   --   --   --   --   --  105* 50*  --   HEPARINUNFRC  --   --   --    < >  --    < >  --  >1.10* 0.49 0.32  CREATININE  --  1.14*  --   --  1.10*  --   --   --  1.09*  --    < > = values in this interval not displayed.    Estimated Creatinine Clearance: 41.4 mL/min (A) (by C-G formula based on SCr of 1.09 mg/dL (H)).  Assessment: 78 yo F presenting with pneumonia requiring intubation. Pt now with new afib RVR; no AC PTA. Pharmacy consulted to dose heparin.  No signs/symptoms of bleeding or interruptions to the infusion. CBC stable.   10/29 PM: Given labile heparin levels/atypical dosing range after discussion with primary okayed to switch patient to bivalirudin.   Goal of Therapy:  aPTT 50-85 seconds Monitor platelets by anticoagulation protocol: Yes   Plan:  Stop Heparin infusion. Start bivalirudin at 0.1 mg/kg/hr given CrCl 31-60.  aPTT level 4 hours after initiation Continue to monitor H&H and platelets  Jillaine Waren, PharmD Clinical  Pharmacist 07/03/2024  3:48 PM **Pharmacist phone directory can now be found on [email or web address] ([email or web address]).  Listed under [facility].

## 2024-07-03 NOTE — Plan of Care (Signed)
  Problem: Clinical Measurements: Goal: Ability to maintain clinical measurements within normal limits will improve Outcome: Progressing Goal: Respiratory complications will improve Outcome: Progressing   Problem: Nutrition: Goal: Adequate nutrition will be maintained Outcome: Progressing

## 2024-07-03 NOTE — Progress Notes (Addendum)
 PHARMACY - ANTICOAGULATION CONSULT NOTE  Pharmacy Consult for IV heparin Indication: atrial fibrillation  Allergies  Allergen Reactions   Ace Inhibitors Swelling   Diphenhydramine Other (See Comments)    Hyperactive/jittery    Doxycycline Hyclate Nausea And Vomiting   Codeine Nausea And Vomiting    Patient Measurements: Height: 5' 1 (154.9 cm) Weight: 75.4 kg (166 lb 3.6 oz) IBW/kg (Calculated) : 47.8 HEPARIN DW (KG): 63.1  Vital Signs: Temp: 98.2 F (36.8 C) (10/29 1230) Temp Source: Axillary (10/29 1230) BP: 106/69 (10/29 1300) Pulse Rate: 106 (10/29 1300)  Labs: Recent Labs     0000 07/01/24 0249 07/01/24 1202 07/01/24 2214 07/02/24 0434 07/02/24 0851 07/02/24 1048 07/02/24 1815 07/03/24 0551 07/03/24 1328  HGB   < >  --  9.3*  --   --   --  8.6*  --  8.6*  --   HCT  --   --  30.0*  --   --   --  28.9*  --  28.0*  --   PLT  --   --  332  --   --   --  374  --  379  --   APTT  --   --   --   --   --   --   --  105* 50*  --   HEPARINUNFRC  --   --   --    < >  --    < >  --  >1.10* 0.49 0.32  CREATININE  --  1.14*  --   --  1.10*  --   --   --  1.09*  --    < > = values in this interval not displayed.    Estimated Creatinine Clearance: 41.4 mL/min (A) (by C-G formula based on SCr of 1.09 mg/dL (H)).  Assessment: 78 yo F presenting with pneumonia requiring intubation. Pt now with new afib RVR; no AC PTA. Pharmacy consulted to dose heparin.  Repeat heparin level dropped slightly but remains therapeutic at 0.32. No signs/symptoms of bleeding or interruptions to the infusion. CBC stable. Will increase slightly to prevent her level from dropping further and address the plan to continue heparin v transition to another agent moving forward with CCM.    Goal of Therapy:  Heparin level 0.3-0.7 units/ml Monitor platelets by anticoagulation protocol: Yes   Plan:  Increase heparin infusion at 400 units/hr Check anti-Xa level daily while on heparin Continue to  monitor H&H and platelets  Makena Murdock, PharmD PGY1 Clinical Pharmacist [facility]  07/03/2024 3:14 PM

## 2024-07-03 NOTE — Progress Notes (Signed)
 NAMEJOHNEISHA BROADEN, MRN:  [id number], DOB:  04/09/1946, LOS: 8 ADMISSION DATE:  06/25/2024, CONSULTATION DATE:  06/25/2024 REFERRING MD:  Theodoro SAUNDERS, CHIEF COMPLAINT:  AMS, Sepsis, ARF  History of Present Illness:  Hanah Moultry is a 78 yo female with past medical history significant for HTN, COPD, acute on chronic hypoxic, hypercapnic respiratory failure on 5-6L [place] baseline, diastolic heart failure, SCC Left lower lobe s/p radiation in September 2025, who presented via EMS after being found unresponsive at home. Code stroke called. On arrival to ED patient emergently intubated for acute respiratory distress and airway protection. CT Head with no ICH or large infarct, CTA head and neck with no LVO. CXR concerning for a Pneumonia with visible BL lung apices on CTA demonstrating infection/inflammation. Code stroke cancelled with presentation suspicious for sepsis. Patient received 30ml/kg crystalloid, pan cultured, and started empirically on Cefepime/Vanc/Azithromycin . Of note, family reports patient has recently not felt well and likely has had a cold. PCCM consulted for ICU admission.   On exam in ED, patient febrile to >101 and hypotensive, likely multifactorial in setting of presumed sepsis and recent sedation on induction with underlying heart disease. Norepinephrine gtt was initiated for MAPs < 65 and patient was transferred to ICU with plans to gain central access for vasopressor administration and arterial line placement for hemodynamic monitoring.  Pertinent  Medical History  Per above  Significant Hospital Events: Including procedures, antibiotic start and stop dates in addition to other pertinent events   Found unresponsive at home->ED>Code stroke>intubated for ARD/airway protection>CTH/CTA negative>Code stroke cancelled>Hypotensive>started on pressors>Admit to ICU  Arterial line placed 10/21 10/22 and 10/23 unsuccessful extubation 10/24: Despite maximal Precedex and fentanyl   pushes, unable to safely and comfortably clean the patient up so added prn Versed and started fentanyl  infusion. Rectal tube added for diarrhea.  10/24: 2/3 BC resulted in staph capitis/epidermitis, extubated on BiPAP 10/25 patient remained on BiPAP for 24 hours, when she came off of BiPAP, could not tolerate became tachypneic, tachycardic went into respiratory distress requiring endotracheal reintubation 10/29 unsuccessful SBT, went briefly back into RVR  Interim History / Subjective:  Failed SBT, brief RVR  Objective    Blood pressure 130/77, pulse 68, temperature (!) 96.2 F (35.7 C), temperature source Axillary, resp. rate (!) 25, height 5' 1 (1.549 m), weight 75.4 kg, SpO2 98%.    Vent Mode: PRVC FiO2 (%):  [30 %-35 %] 30 % Set Rate:  [25 bmp] 25 bmp Vt Set:  [380 mL] 380 mL PEEP:  [5 cmH20] 5 cmH20 Plateau Pressure:  [26 cmH20] 26 cmH20   Intake/Output Summary (Last 24 hours) at 07/03/2024 0800 Last data filed at 07/03/2024 0600 Gross per 24 hour  Intake 1606.74 ml  Output 4200 ml  Net -2593.26 ml   Filed Weights   06/29/24 0414 06/30/24 0500 07/01/24 0500  Weight: 78 kg 77.9 kg 75.4 kg    Examination: General: Chronically-ill elderly woman lying in bed, NAD, sedated Lungs: Coarse lung sounds bilaterally. 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 Will need tracheostomy if she fails again,  for now will optimize her from a  respiratory/cardiac standpoint. Went into RVR so started Amio infusion.  Zosyn per Pharmacy (Day 5/7) LTVV/VAP prevention/PAD protocol Nebulized Triple Therapy, hypertonic NS CPT for secretions Continue Lopressor  25 BID for A-Fib, on heparin gtt > Angiomax given fluctuating heparin levels Continue Prednisone  taper Continue TF Repeat BC NG after 5 days, Tracheal aspirate NGTD, repeat pending  Trend BMP, CBC, Triglycerides (Propofol ) Ativan  1 mg q 8  Fiber  Duoneb increased to q 2   Hyperglycemia Above 140-180 goal so 5 units Glargine started in addition to increased SSI  Labs   CBC: Recent Labs  Lab 06/28/24 0824 06/28/24 1433 06/29/24 0312 06/29/24 0925 06/29/24 1447 06/30/24 0836 07/01/24 1202 07/02/24 1048 07/03/24 0551  WBC 13.0*  --  9.1  --   --   --  12.8* 15.0* 12.4*  HGB 9.8*   < > 8.5*   < > 9.5* 9.9* 9.3* 8.6* 8.6*  HCT 32.5*   < > 27.8*   < > 28.0* 29.0* 30.0* 28.9* 28.0*  MCV 96.7  --  96.2  --   --   --  94.0 97.0 94.6  PLT 307  --  259  --   --   --  332 374 379   < > = values in this interval not displayed.    Basic Metabolic Panel: Recent Labs  Lab 06/27/24 1503 06/28/24 0659 06/28/24 0824 06/29/24 0312 06/29/24 0925 06/30/24 0500 06/30/24 0836 07/01/24 0249 07/02/24 0434 07/03/24 0551  NA  --   --    < > 140   < > 143 141 142 142 143  K  --   --    < > 4.6   < > 3.8 3.9 4.0 4.6 4.4  CL  --   --    < > 102  --  102  --  103 104 97*  CO2  --   --    < > 29  --  27  --  28 30 33*  GLUCOSE  --   --    < > 135*  --  103*  --  196* 166* 239*  BUN  --   --    < > 39*  --  44*  --  43* 44* 44*  CREATININE  --   --    < > 1.18*  --  1.29*  --  1.14* 1.10* 1.09*  CALCIUM   --   --    < > 9.0  --  8.8*  --  8.7* 8.5* 9.2  MG 2.3 2.4  --  2.7*  --  2.5*  --   --  2.4  --   PHOS 3.7 3.5  --  3.2  --  3.0  --   --   --   --    < > = values in this interval not displayed.   GFR: Estimated Creatinine Clearance: 41.4 mL/min (A) (by C-G formula based on  SCr of 1.09 mg/dL (H)). Recent Labs  Lab 06/29/24 0312 07/01/24 0249 07/01/24 1202 07/02/24 1048 07/03/24 0551  PROCALCITON 0.82 1.61  --   --   --   WBC 9.1  --  12.8* 15.0* 12.4*    Liver Function Tests: Recent Labs  Lab 06/27/24 0451 06/28/24 0824 06/29/24 0312  AST 91* 75* 59*  ALT 107* 141* 128*  ALKPHOS 37* 40 40  BILITOT 0.2 0.2 0.4  PROT 5.7* 6.3* 5.4*  ALBUMIN 2.5* 2.8* 2.6*  No results for input(s): LIPASE, AMYLASE in the last 168 hours. No results for input(s): AMMONIA in the last 168 hours.  ABG    Component Value Date/Time   PHART 7.458 (H) 06/30/2024 0836   PCO2ART 41.9 06/30/2024 0836   PO2ART 204 (H) 06/30/2024 0836   HCO3 29.6 (H) 06/30/2024 0836   TCO2 31 06/30/2024 0836   O2SAT 100 06/30/2024 0836     Coagulation Profile: No results for input(s): INR, PROTIME in the last 168 hours.  Cardiac Enzymes: No results for input(s): CKTOTAL, CKMB, CKMBINDEX, TROPONINI in the last 168 hours.  HbA1C: Hgb A1c MFr Bld  Date/Time Value Ref Range Status  06/26/2024 04:47 AM 5.6 4.8 - 5.6 % Final    Comment:    (NOTE) Diagnosis of Diabetes The following HbA1c ranges recommended by the American Diabetes Association (ADA) may be used as an aid in the diagnosis of diabetes mellitus.  Hemoglobin             Suggested A1C NGSP%              Diagnosis  <5.7                   Non Diabetic  5.7-6.4                Pre-Diabetic  >6.4                   Diabetic  <7.0                   Glycemic control for                       adults with diabetes.    09/17/2013 01:57 PM 6.1 (H) <5.7 % Final    Comment:    (NOTE)                                                                       According to the ADA Clinical Practice Recommendations for 2011, when HbA1c is used as a screening test:  >=6.5%   Diagnostic of Diabetes Mellitus           (if abnormal result is confirmed) 5.7-6.4%   Increased risk of developing Diabetes  [place] References:Diagnosis and Classification of Diabetes Mellitus,Diabetes Care,2011,34(Suppl 1):S62-S69 and Standards of Medical Care in         Diabetes - 2011,Diabetes Care,2011,34 (Suppl 1):S11-S61.    CBG: Recent Labs  Lab 07/02/24 1548 07/02/24 2011 07/02/24 2358 07/03/24 0344 07/03/24 0729  GLUCAP 215* 184* 197* 237* 182*    Review of Systems:     Past Medical History:  She,  has a past medical history of Abnormal finding on EKG (09/19/2013), Acute on chronic respiratory failure with hypercapnia (HCC), Acute respiratory failure (HCC) (06/08/2016), Anxiety (11/09/2020), Arthritis, Asthma, Carotid bruit (11/09/2020), Chronic diastolic CHF (congestive heart failure) (HCC) (07/28/2017), Chronic obstructive pulmonary disease, unspecified (HCC) (11/09/2020), COPD, group D, by GOLD 2017 classification (HCC) (09/17/2013), Cough (11/09/2020), Decreased estrogen level (11/09/2020), Diabetes (HCC), Dyspnea, Edema (11/09/2020), Essential hypertension (09/18/2013), Goals of care, counseling/discussion, Hardening of the aorta (main artery of the heart) (11/09/2020), Heart disease, History of radiation therapy, Hypertension, Hypertensive heart failure (HCC) (11/09/2020),  Hypoxia (11/09/2020), Insomnia (11/09/2020), Iron deficiency anemia (11/09/2020), Large liver (11/09/2020), Near syncope (09/17/2013), Osteoporosis (11/09/2020), Palliative care encounter, Prediabetes (09/19/2013), Pure hypercholesterolemia (11/09/2020), Raynaud's disease (11/09/2020), Sinus tachycardia (09/18/2013), Skin sensation disturbance (11/09/2020), Smoker (09/17/2013), 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   Surgical History:   Past Surgical History:  Procedure Laterality Date   BRONCHIAL BIOPSY  05/25/2023   Procedure: BRONCHIAL BIOPSIES;  Surgeon: Brenna Adine CROME, DO;  Location: [facility];  Service:  Pulmonary;;   BRONCHIAL NEEDLE ASPIRATION BIOPSY  05/25/2023   Procedure: BRONCHIAL NEEDLE ASPIRATION BIOPSIES;  Surgeon: Brenna Adine CROME, DO;  Location: [facility];  Service: Pulmonary;;   FIDUCIAL MARKER PLACEMENT  05/25/2023   Procedure: FIDUCIAL MARKER PLACEMENT;  Surgeon: Brenna Adine CROME, DO;  Location: [facility];  Service: Pulmonary;;   KNEE SURGERY Left      Social History:   reports that she quit smoking about 6 years ago. Her smoking use included cigarettes. She started smoking about 6 years ago. She has a 55 pack-year smoking history. She has never used smokeless tobacco. She reports that she does not drink alcohol and does not use drugs.   Family History:  Her family history includes Hyperlipidemia in her father and mother; Hypertension in her father and mother.   Allergies Allergies  Allergen Reactions   Ace Inhibitors Swelling   Diphenhydramine Other (See Comments)    Hyperactive/jittery    Doxycycline Hyclate Nausea And Vomiting   Codeine Nausea And Vomiting     Home Medications  Prior to Admission medications   Medication Sig Start Date End Date Taking? Authorizing Provider  acetaminophen  (TYLENOL ) 500 MG tablet Take 1,000 mg by mouth every 6 (six) hours as needed for moderate pain.   Yes [provider]  albuterol  (PROVENTIL  HFA;VENTOLIN  HFA) 108 (90 BASE) MCG/ACT inhaler Inhale 2 puffs into the lungs every 6 (six) hours as needed for wheezing or shortness of breath. 09/19/13  Yes Johnson, Clanford L, MD  aspirin  81 MG chewable tablet Chew 1 tablet (81 mg total) by mouth daily. 09/19/13  Yes Johnson, Clanford L, MD  atorvastatin  (LIPITOR) 10 MG tablet TAKE 1 TABLET BY MOUTH EVERY DAY 07/21/23  Yes Krasowski, Robert J, MD  benzonatate  (TESSALON ) 100 MG capsule Take 100 mg by mouth 3 (three) times daily as needed for cough.    Yes [provider]  Biotin 10 MG CAPS Take 10 mg by mouth daily.   Yes [provider]  budesonide -formoterol   (SYMBICORT ) 160-4.5 MCG/ACT inhaler Inhale 2 puffs into the lungs 2 (two) times daily. 08/23/19  Yes Mannam, Praveen, MD  calcium  carbonate (OSCAL) 1500 (600 Ca) MG TABS tablet Take 600 mg of elemental calcium  by mouth daily.   Yes [provider]  Cholecalciferol (VITAMIN D ) 50 MCG (2000 UT) tablet Take 2,000 Units by mouth daily.   Yes [provider]  diphenhydramine-acetaminophen  (TYLENOL  PM) 25-500 MG TABS tablet Take 1 tablet by mouth at bedtime.   Yes [provider]  furosemide  (LASIX ) 40 MG tablet Take 40 mg by mouth daily.   Yes [provider]  gabapentin (NEURONTIN) 300 MG capsule Take 900 mg by mouth at bedtime.   Yes [provider]  iron polysaccharides (NIFEREX) 150 MG capsule Take 150 mg by mouth daily. 11/05/20  Yes [provider]  loratadine (CLARITIN) 10 MG tablet Take 10 mg by mouth daily as needed for allergies.   Yes [provider]  LORazepam  (ATIVAN ) 1 MG tablet Take 1 tablet (1 mg total) by mouth every 6 (six) hours as needed for anxiety. 08/07/17  Yes Drusilla Sabas RAMAN, MD  losartan  (COZAAR ) 50 MG tablet Take 50 mg by mouth daily.   Yes [provider]  Multiple Vitamin (MULTIVITAMIN WITH MINERALS) TABS tablet Take 1 tablet by mouth daily.   Yes [provider]  raloxifene  (EVISTA ) 60 MG tablet Take 60 mg by mouth every morning. 08/07/20  Yes [provider]  Tiotropium Bromide  Monohydrate (SPIRIVA  RESPIMAT) 2.5 MCG/ACT AERS Inhale 1 puff into the lungs daily.   Yes [provider]  traZODone  (DESYREL ) 100 MG tablet Take 1 tablet (100 mg total) by mouth at bedtime as needed for sleep. 08/07/17  Yes Drusilla Sabas RAMAN, MD     Norman Lobstein, DO

## 2024-07-03 NOTE — Progress Notes (Signed)
 PHARMACY - ANTICOAGULATION CONSULT NOTE  Pharmacy Consult for IV heparin Indication: atrial fibrillation  Allergies  Allergen Reactions   Ace Inhibitors Swelling   Diphenhydramine Other (See Comments)    Hyperactive/jittery    Doxycycline Hyclate Nausea And Vomiting   Codeine Nausea And Vomiting    Patient Measurements: Height: 5' 1 (154.9 cm) Weight: 75.4 kg (166 lb 3.6 oz) IBW/kg (Calculated) : 47.8 HEPARIN DW (KG): 63.1  Vital Signs: Temp: 96.2 F (35.7 C) (10/29 0726) Temp Source: Axillary (10/29 0726) BP: 113/70 (10/29 0830) Pulse Rate: 78 (10/29 0900)  Labs: Recent Labs     0000 07/01/24 0249 07/01/24 1202 07/01/24 2214 07/02/24 0434 07/02/24 0851 07/02/24 1048 07/02/24 1815 07/03/24 0551  HGB   < >  --  9.3*  --   --   --  8.6*  --  8.6*  HCT  --   --  30.0*  --   --   --  28.9*  --  28.0*  PLT  --   --  332  --   --   --  374  --  379  APTT  --   --   --   --   --   --   --  105* 50*  HEPARINUNFRC  --   --   --    < >  --  >1.10*  --  >1.10* 0.49  CREATININE  --  1.14*  --   --  1.10*  --   --   --  1.09*   < > = values in this interval not displayed.    Estimated Creatinine Clearance: 41.4 mL/min (A) (by C-G formula based on SCr of 1.09 mg/dL (H)).  Assessment: 78 yo F presenting with pneumonia requiring intubation. Pt now with new afib RVR; no AC PTA. Pharmacy consulted to dose heparin.  Heparin level remains supra-therapeutic at >1.1 on 800 units/hr. Per RN, level was not drawn from the line. No signs/symptoms of bleeding. Most recent CBC stable: Hgb 9.5, plt 332.   Heparin level therapeutic at 0.49. Previously supratherapeutic on multiple checks despite holding and dose reducing, prompting us  to get an aPTT.  Her picture is inconsistent with reasons for a falsely elevated HL, but aPTT does not correlate (50sec). Will continue her on her current rate and address the plan to continue heparin v transition to another agent moving forward with CCM.     Goal of Therapy:  Heparin level 0.3-0.7 units/ml Monitor platelets by anticoagulation protocol: Yes   Plan:  Continue heparin infusion at 350 units/hr Check anti-Xa level in 8 hours and daily while on heparin Continue to monitor H&H and platelets  Elma Fail, PharmD PGY1 Clinical Pharmacist [facility]  07/03/2024 9:19 AM

## 2024-07-03 NOTE — Progress Notes (Signed)
 PHARMACY - ANTICOAGULATION  Pharmacy Consult for bivalirudin Indication: atrial fibrillation Brief A/P: aPTT within goal range Continue bivalirudin at current rate  Allergies  Allergen Reactions   Ace Inhibitors Swelling   Diphenhydramine Other (See Comments)    Hyperactive/jittery    Doxycycline Hyclate Nausea And Vomiting   Codeine Nausea And Vomiting    Patient Measurements: Height: 5' 1 (154.9 cm) Weight: 75.4 kg (166 lb 3.6 oz) IBW/kg (Calculated) : 47.8 HEPARIN DW (KG): 63.1  Vital Signs: Temp: 98.2 F (36.8 C) (10/29 2331) Temp Source: Axillary (10/29 2331) BP: 125/61 (10/29 2300) Pulse Rate: 66 (10/29 2300)  Labs: Recent Labs     0000 07/01/24 0249 07/01/24 1202 07/01/24 2214 07/02/24 0434 07/02/24 0851 07/02/24 1048 07/02/24 1815 07/03/24 0551 07/03/24 1328 07/03/24 2240  HGB   < >  --  9.3*  --   --   --  8.6*  --  8.6*  --   --   HCT  --   --  30.0*  --   --   --  28.9*  --  28.0*  --   --   PLT  --   --  332  --   --   --  374  --  379  --   --   APTT  --   --   --   --   --   --   --  105* 50*  --  56*  HEPARINUNFRC  --   --   --    < >  --    < >  --  >1.10* 0.49 0.32  --   CREATININE  --  1.14*  --   --  1.10*  --   --   --  1.09*  --   --    < > = values in this interval not displayed.    Estimated Creatinine Clearance: 41.4 mL/min (A) (by C-G formula based on SCr of 1.09 mg/dL (H)).  Assessment: 78 y.o. female with Afib for bivalirudin  Goal of Therapy:  aPTT 50-85 seconds Monitor platelets by anticoagulation protocol: Yes   Plan:  No change to bivalirudin Follow-up am labs.   Cathlyn Arrant, PharmD, BCPS

## 2024-07-04 DIAGNOSIS — J9621 Acute and chronic respiratory failure with hypoxia: Secondary | ICD-10-CM | POA: Diagnosis not present

## 2024-07-04 DIAGNOSIS — J90 Pleural effusion, not elsewhere classified: Secondary | ICD-10-CM | POA: Diagnosis not present

## 2024-07-04 DIAGNOSIS — J123 Human metapneumovirus pneumonia: Secondary | ICD-10-CM | POA: Diagnosis not present

## 2024-07-04 DIAGNOSIS — J441 Chronic obstructive pulmonary disease with (acute) exacerbation: Secondary | ICD-10-CM | POA: Diagnosis not present

## 2024-07-04 LAB — BASIC METABOLIC PANEL WITH GFR
Anion gap: 12 (ref 5–15)
BUN: 45 mg/dL — ABNORMAL HIGH (ref 8–23)
CO2: 37 mmol/L — ABNORMAL HIGH (ref 22–32)
Calcium: 9.1 mg/dL (ref 8.9–10.3)
Chloride: 93 mmol/L — ABNORMAL LOW (ref 98–111)
Creatinine, Ser: 0.93 mg/dL (ref 0.44–1.00)
GFR, Estimated: >60 mL/min (ref >60)
Glucose, Bld: 169 mg/dL — ABNORMAL HIGH (ref 70–99)
Potassium: 4.8 mmol/L (ref 3.5–5.1)
Sodium: 142 mmol/L (ref 135–145)

## 2024-07-04 LAB — CBC
HCT: 27.4 % — ABNORMAL LOW (ref 36.0–46.0)
Hemoglobin: 8.3 g/dL — ABNORMAL LOW (ref 12.0–15.0)
MCH: 29 pg (ref 26.0–34.0)
MCHC: 30.3 g/dL (ref 30.0–36.0)
MCV: 95.8 fL (ref 80.0–100.0)
Platelets: 374 K/uL (ref 150–400)
RBC: 2.86 MIL/uL — ABNORMAL LOW (ref 3.87–5.11)
RDW: 13.8 % (ref 11.5–15.5)
WBC: 11.8 K/uL — ABNORMAL HIGH (ref 4.0–10.5)
nRBC: 2.6 % — ABNORMAL HIGH (ref 0.0–0.2)

## 2024-07-04 LAB — GLUCOSE, CAPILLARY
Glucose-Capillary: 109 mg/dL — ABNORMAL HIGH (ref 70–99)
Glucose-Capillary: 129 mg/dL — ABNORMAL HIGH (ref 70–99)
Glucose-Capillary: 130 mg/dL — ABNORMAL HIGH (ref 70–99)
Glucose-Capillary: 137 mg/dL — ABNORMAL HIGH (ref 70–99)
Glucose-Capillary: 158 mg/dL — ABNORMAL HIGH (ref 70–99)
Glucose-Capillary: 184 mg/dL — ABNORMAL HIGH (ref 70–99)

## 2024-07-04 LAB — APTT
aPTT: 50 s — ABNORMAL HIGH (ref 24–36)
aPTT: 48 s — ABNORMAL HIGH (ref 24–36)

## 2024-07-04 MED ORDER — METOLAZONE 2.5 MG PO TABS
5.0000 mg | ORAL_TABLET | Freq: Once | ORAL | Status: AC
  Administered 2024-07-04: 5 mg
  Filled 2024-07-04: qty 2

## 2024-07-04 MED ORDER — FUROSEMIDE 10 MG/ML IJ SOLN
80.0000 mg | Freq: Two times a day (BID) | INTRAMUSCULAR | Status: AC
  Administered 2024-07-04 (×2): 80 mg via INTRAVENOUS
  Filled 2024-07-04 (×2): qty 8

## 2024-07-04 MED ORDER — SIMETHICONE 80 MG PO CHEW
160.0000 mg | CHEWABLE_TABLET | Freq: Three times a day (TID) | ORAL | Status: DC | PRN
  Administered 2024-07-04 – 2024-07-05 (×3): 160 mg
  Filled 2024-07-04 (×4): qty 2

## 2024-07-04 MED ORDER — BANATROL TF EN LIQD
60.0000 mL | Freq: Four times a day (QID) | ENTERAL | Status: DC
  Administered 2024-07-04 – 2024-07-05 (×2): 60 mL
  Filled 2024-07-04 (×2): qty 60

## 2024-07-04 MED ORDER — GABAPENTIN 300 MG PO CAPS
600.0000 mg | ORAL_CAPSULE | Freq: Every day | ORAL | Status: DC
  Administered 2024-07-04 – 2024-07-06 (×2): 600 mg
  Filled 2024-07-04 (×2): qty 2

## 2024-07-04 NOTE — Plan of Care (Signed)
  Problem: Clinical Measurements: Goal: Ability to maintain clinical measurements within normal limits will improve Outcome: Progressing Goal: Diagnostic test results will improve Outcome: Progressing   Problem: Nutrition: Goal: Adequate nutrition will be maintained Outcome: Progressing   

## 2024-07-04 NOTE — Progress Notes (Signed)
 Katie Waller, MRN:  [id number], DOB:  1946-06-11, LOS: 9 ADMISSION DATE:  06/25/2024, CONSULTATION DATE:  06/25/2024 REFERRING MD:  Theodoro SAUNDERS, CHIEF COMPLAINT:  AMS, Sepsis, ARF  History of Present Illness:  Katie Waller is a 78 yo female with past medical history significant for HTN, COPD, acute on chronic hypoxic, hypercapnic respiratory failure on 5-6L [place] baseline, diastolic heart failure, SCC Left lower lobe s/p radiation in September 2025, who presented via EMS after being found unresponsive at home. Code stroke called. On arrival to ED patient emergently intubated for acute respiratory distress and airway protection. CT Head with no ICH or large infarct, CTA head and neck with no LVO. CXR concerning for a Pneumonia with visible BL lung apices on CTA demonstrating infection/inflammation. Code stroke cancelled with presentation suspicious for sepsis. Patient received 30ml/kg crystalloid, pan cultured, and started empirically on Cefepime/Vanc/Azithromycin . Of note, family reports patient has recently not felt well and likely has had a cold. PCCM consulted for ICU admission.   On exam in ED, patient febrile to >101 and hypotensive, likely multifactorial in setting of presumed sepsis and recent sedation on induction with underlying heart disease. Norepinephrine gtt was initiated for MAPs < 65 and patient was transferred to ICU with plans to gain central access for vasopressor administration and arterial line placement for hemodynamic monitoring.  Pertinent  Medical History  Per above  Significant Hospital Events: Including procedures, antibiotic start and stop dates in addition to other pertinent events   Found unresponsive at home->ED>Code stroke>intubated for ARD/airway protection>CTH/CTA negative>Code stroke cancelled>Hypotensive>started on pressors>Admit to ICU  Arterial line placed 10/21 10/22 and 10/23 unsuccessful extubation 10/24: Despite maximal Precedex and fentanyl   pushes, unable to safely and comfortably clean the patient up so added prn Versed and started fentanyl  infusion. Rectal tube added for diarrhea.  10/24: 2/3 BC resulted in staph capitis/epidermitis, extubated on BiPAP 10/25 patient remained on BiPAP for 24 hours, when she came off of BiPAP, could not tolerate became tachypneic, tachycardic went into respiratory distress requiring endotracheal reintubation 10/29 unsuccessful SBT, went briefly back into RVR 10/30 unsuccessful weaning from sedation as BP/HR increased considerably  Interim History / Subjective:  Failed sedation wean  Objective    Blood pressure (!) 162/71, pulse 100, temperature 98.5 F (36.9 C), temperature source Axillary, resp. rate 17, height 5' 1 (1.549 m), weight 75.4 kg, SpO2 96%.    Vent Mode: PRVC FiO2 (%):  [30 %-40 %] 40 % Set Rate:  [15 bmp] 15 bmp Vt Set:  [380 mL] 380 mL PEEP:  [5 cmH20] 5 cmH20 Pressure Support:  [10 cmH20] 10 cmH20 Plateau Pressure:  [19 cmH20-25 cmH20] 20 cmH20   Intake/Output Summary (Last 24 hours) at 07/04/2024 1712 Last data filed at 07/04/2024 1601 Gross per 24 hour  Intake 2497.71 ml  Output 2750 ml  Net -252.29 ml   Filed Weights   06/29/24 0414 06/30/24 0500 07/01/24 0500  Weight: 78 kg 77.9 kg 75.4 kg    Examination: General: Chronically-ill elderly woman lying in bed, NAD, sedated Lungs: Coarse lung sounds bilaterally. 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 Will likely needs tracheostomy but for now will optimize her from a respiratory/cardiac standpoint. Rates are better with Amio infusion and Lopressor  but BP/HR went up considerably when trying to wean sedation for SBT  Zosyn per Pharmacy (Day 6/7) LTVV/VAP prevention/PAD protocol Nebulized Triple Therapy, hypertonic NS CPT for secretions Continue Lopressor  25 BID for A-Fib, on heparin gtt > Angiomax given fluctuating heparin levels Continue Prednisone  taper Continue TF Repeat BC NG after 5 days, 10/26 Tracheal aspirate showed rare candida, repeat 10/28 shows rare yeasts/mold likely contaminant Trend BMP, CBC, Triglycerides (Propofol ) Ativan  1 mg q 8  Fiber  Duoneb increased to q 2 Added home gabapentin   CKD 3A Seems to be improving with IV Lasix . Contraction alkalosis on BMP today. Will give IV Lasix  80 X 1 and Metolazone 5 mg per tube. Trend I/O's, BMP.   Hyperglycemia Continue 5 units Semglee + SSI   Labs   CBC: Recent Labs  Lab 06/29/24 0312 06/29/24 0925 06/30/24 0836 07/01/24 1202 07/02/24 1048 07/03/24 0551 07/04/24 0442  WBC 9.1  --   --  12.8* 15.0* 12.4* 11.8*  HGB 8.5*   < > 9.9* 9.3* 8.6* 8.6* 8.3*  HCT 27.8*   < > 29.0* 30.0* 28.9* 28.0* 27.4*  MCV 96.2  --   --  94.0 97.0 94.6 95.8  PLT 259  --   --  332 374 379 374   < > = values in this interval not displayed.    Basic Metabolic Panel: Recent Labs  Lab 06/28/24 0659 06/28/24 0824 06/29/24 0312 06/29/24 0925 06/30/24 0500 06/30/24 0836 07/01/24 0249 07/02/24 0434 07/03/24 0551 07/04/24 0442  NA  --    < > 140   < > 143 141 142 142 143 142  K  --    < > 4.6   < > 3.8 3.9 4.0 4.6 4.4 4.8  CL  --    < > 102  --  102  --  103 104 97* 93*  CO2  --    < > 29  --  27  --  28 30 33* 37*  GLUCOSE  --    < > 135*  --  103*  --  196* 166* 239* 169*  BUN  --    < > 39*  --  44*  --  43* 44* 44* 45*  CREATININE  --    < > 1.18*  --  1.29*  --  1.14* 1.10* 1.09* 0.93   CALCIUM   --    < > 9.0  --  8.8*  --  8.7* 8.5* 9.2 9.1  MG 2.4  --  2.7*  --  2.5*  --   --  2.4  --   --   PHOS 3.5  --  3.2  --  3.0  --   --   --   --   --    < > = values in this interval not displayed.   GFR: Estimated Creatinine Clearance: 48.5 mL/min (by C-G formula based on SCr of 0.93 mg/dL). Recent Labs  Lab 06/29/24 0312 07/01/24 0249 07/01/24 1202 07/02/24 1048 07/03/24 0551 07/04/24 0442  PROCALCITON 0.82 1.61  --   --   --   --   WBC 9.1  --  12.8* 15.0* 12.4* 11.8*    Liver Function Tests: Recent Labs  Lab 06/28/24 0824 06/29/24 0312  AST 75* 59*  ALT 141* 128*  ALKPHOS 40 40  BILITOT 0.2 0.4  PROT 6.3* 5.4*  ALBUMIN 2.8* 2.6*   No results for input(s): LIPASE, AMYLASE in the last 168 hours. No results for input(s): AMMONIA in the last 168 hours.  ABG    Component Value Date/Time   PHART 7.458 (H) 06/30/2024 0836   PCO2ART 41.9 06/30/2024 0836   PO2ART 204 (H) 06/30/2024 0836   HCO3 29.6 (H) 06/30/2024 0836   TCO2 31 06/30/2024 0836   O2SAT 100 06/30/2024 0836     Coagulation Profile: No results for input(s): INR, PROTIME in the last 168 hours.  Cardiac Enzymes: No results for input(s): CKTOTAL, CKMB, CKMBINDEX, TROPONINI in the last 168 hours.  HbA1C: Hgb A1c MFr Bld  Date/Time Value Ref Range Status  06/26/2024 04:47 AM 5.6 4.8 - 5.6 % Final    Comment:    (NOTE) Diagnosis of Diabetes The following HbA1c ranges recommended by the American Diabetes Association (ADA) may be used as an aid in the diagnosis of diabetes mellitus.  Hemoglobin             Suggested A1C NGSP%              Diagnosis  <5.7                   Non Diabetic  5.7-6.4                Pre-Diabetic  >6.4                   Diabetic  <7.0                   Glycemic control for                       adults with diabetes.    09/17/2013 01:57 PM 6.1 (H) <5.7 % Final    Comment:    (NOTE)                                                                        According to the ADA Clinical Practice Recommendations for 2011, when HbA1c is used as a screening test:  >=6.5%   Diagnostic of Diabetes Mellitus           (if abnormal result is confirmed) 5.7-6.4%   Increased risk of developing Diabetes Mellitus References:Diagnosis and Classification of Diabetes Mellitus,Diabetes Care,2011,34(Suppl 1):S62-S69 and Standards of Medical Care in         Diabetes - 2011,Diabetes Care,2011,34 (Suppl 1):S11-S61.    CBG: Recent Labs  Lab 07/03/24 2332 07/04/24 0349 07/04/24 0834 07/04/24 1224 07/04/24 1546  GLUCAP 222* 184* 129* 109* 158*    Review of Systems:     Past Medical History:  She,  has a past medical history of Abnormal finding on EKG (09/19/2013), Acute on chronic respiratory failure with hypercapnia (HCC), Acute respiratory failure (HCC) (06/08/2016), Anxiety (11/09/2020), Arthritis, Asthma, Carotid bruit (11/09/2020), Chronic diastolic CHF (congestive heart failure) (HCC) (07/28/2017), Chronic obstructive pulmonary disease, unspecified (HCC) (11/09/2020), COPD, group D, by GOLD 2017 classification (HCC) (09/17/2013), Cough (11/09/2020), Decreased estrogen level (11/09/2020), Diabetes (HCC), Dyspnea, Edema (11/09/2020), Essential hypertension (09/18/2013), Goals of care, counseling/discussion, Hardening of the aorta (main artery of the heart) (  11/09/2020), Heart disease, History of radiation therapy, Hypertension, Hypertensive heart failure (HCC) (11/09/2020), Hypoxia (11/09/2020), Insomnia (11/09/2020), Iron deficiency anemia (11/09/2020), Large liver (11/09/2020), Near syncope (09/17/2013), Osteoporosis (11/09/2020), Palliative care encounter, Prediabetes (09/19/2013), Pure hypercholesterolemia (11/09/2020), Raynaud's disease (11/09/2020), Sinus tachycardia (09/18/2013), Skin sensation disturbance (11/09/2020), Smoker (09/17/2013), 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   Surgical History:   Past Surgical History:  Procedure Laterality Date   BRONCHIAL BIOPSY  05/25/2023   Procedure: BRONCHIAL BIOPSIES;  Surgeon: Brenna Adine CROME, DO;  Location: [facility];  Service: Pulmonary;;   BRONCHIAL NEEDLE ASPIRATION BIOPSY  05/25/2023   Procedure: BRONCHIAL NEEDLE ASPIRATION BIOPSIES;  Surgeon: Brenna Adine CROME, DO;  Location: [facility];  Service: Pulmonary;;   FIDUCIAL MARKER PLACEMENT  05/25/2023   Procedure: FIDUCIAL MARKER PLACEMENT;  Surgeon: Brenna Adine CROME, DO;  Location: [facility];  Service: Pulmonary;;   KNEE SURGERY Left      Social History:   reports that she quit smoking about 6 years ago. Her smoking use included cigarettes. She started smoking about 6 years ago. She has a 55 pack-year smoking history. She has never used smokeless tobacco. She reports that she does not drink alcohol and does not use drugs.   Family History:  Her family history includes Hyperlipidemia in her father and mother; Hypertension in her father and mother.   Allergies Allergies  Allergen Reactions   Ace Inhibitors Swelling   Diphenhydramine Other (See Comments)    Hyperactive/jittery    Doxycycline Hyclate Nausea And Vomiting   Codeine Nausea And Vomiting     Home Medications  Prior to Admission medications   Medication Sig Start Date End Date Taking? Authorizing Provider  acetaminophen  (TYLENOL ) 500 MG tablet Take 1,000 mg by mouth every 6 (six) hours as needed for moderate pain.   Yes [provider]  albuterol  (PROVENTIL  HFA;VENTOLIN  HFA) 108 (90 BASE) MCG/ACT inhaler Inhale 2 puffs into the lungs every 6 (six) hours as needed for wheezing or shortness of breath. 09/19/13  Yes Johnson, Clanford L, MD  aspirin  81 MG chewable tablet Chew 1 tablet (81 mg total) by mouth daily. 09/19/13  Yes Johnson, Clanford L, MD  atorvastatin  (LIPITOR) 10 MG tablet TAKE 1 TABLET BY MOUTH EVERY DAY 07/21/23  Yes Krasowski,  Robert J, MD  benzonatate  (TESSALON ) 100 MG capsule Take 100 mg by mouth 3 (three) times daily as needed for cough.    Yes [provider]  Biotin 10 MG CAPS Take 10 mg by mouth daily.   Yes [provider]  budesonide -formoterol  (SYMBICORT ) 160-4.5 MCG/ACT inhaler Inhale 2 puffs into the lungs 2 (two) times daily. 08/23/19  Yes Mannam, Praveen, MD  calcium  carbonate (OSCAL) 1500 (600 Ca) MG TABS tablet Take 600 mg of elemental calcium  by mouth daily.   Yes [provider]  Cholecalciferol (VITAMIN D ) 50 MCG (2000 UT) tablet Take 2,000 Units by mouth daily.   Yes [provider]  diphenhydramine-acetaminophen  (TYLENOL  PM) 25-500 MG TABS tablet Take 1 tablet by mouth at bedtime.   Yes [provider]  furosemide  (LASIX ) 40 MG tablet Take 40 mg by mouth daily.   Yes [provider]  gabapentin (NEURONTIN) 300 MG capsule Take 900 mg by mouth at bedtime.   Yes [provider]  iron polysaccharides (NIFEREX) 150 MG capsule Take 150 mg by mouth daily. 11/05/20  Yes [provider]  loratadine (CLARITIN) 10 MG tablet Take 10 mg by  mouth daily as needed for allergies.   Yes [provider]  LORazepam  (ATIVAN ) 1 MG tablet Take 1 tablet (1 mg total) by mouth every 6 (six) hours as needed for anxiety. 08/07/17  Yes Drusilla Sabas RAMAN, MD  losartan  (COZAAR ) 50 MG tablet Take 50 mg by mouth daily.   Yes [provider]  Multiple Vitamin (MULTIVITAMIN WITH MINERALS) TABS tablet Take 1 tablet by mouth daily.   Yes [provider]  raloxifene  (EVISTA ) 60 MG tablet Take 60 mg by mouth every morning. 08/07/20  Yes [provider]  Tiotropium Bromide  Monohydrate (SPIRIVA  RESPIMAT) 2.5 MCG/ACT AERS Inhale 1 puff into the lungs daily.   Yes [provider]  traZODone  (DESYREL ) 100 MG tablet Take 1 tablet (100 mg total) by mouth at bedtime as needed for sleep. 08/07/17  Yes Drusilla Sabas RAMAN, MD     Norman Lobstein, DO

## 2024-07-04 NOTE — Progress Notes (Signed)
 Patient had another episode of vomiting. Oropharyngeal and ETT suctioned. ETT without secretions. Dr Gretta notified. Given orders to hold TF overnight and reassess in the morning.

## 2024-07-04 NOTE — Progress Notes (Signed)
 PHARMACY - ANTICOAGULATION CONSULT NOTE  Pharmacy Consult for IV bivalirudin Indication: atrial fibrillation  Allergies  Allergen Reactions   Ace Inhibitors Swelling   Diphenhydramine Other (See Comments)    Hyperactive/jittery    Doxycycline Hyclate Nausea And Vomiting   Codeine Nausea And Vomiting    Patient Measurements: Height: 5' 1 (154.9 cm) Weight: 75.4 kg (166 lb 3.6 oz) IBW/kg (Calculated) : 47.8 HEPARIN DW (KG): 63.1  Vital Signs: Temp: 98.7 F (37.1 C) (10/30 0347) Temp Source: Axillary (10/30 0347) BP: 124/67 (10/30 0700) Pulse Rate: 73 (10/30 0700)  Labs: Recent Labs    07/02/24 0434 07/02/24 0851 07/02/24 1048 07/02/24 1815 07/03/24 0551 07/03/24 1328 07/03/24 2240 07/04/24 0442  HGB  --   --  8.6*  --  8.6*  --   --  8.3*  HCT  --   --  28.9*  --  28.0*  --   --  27.4*  PLT  --   --  374  --  379  --   --  374  APTT  --    < >  --  105* 50*  --  56* 50*  HEPARINUNFRC  --    < >  --  >1.10* 0.49 0.32  --   --   CREATININE 1.10*  --   --   --  1.09*  --   --  0.93   < > = values in this interval not displayed.    Estimated Creatinine Clearance: 48.5 mL/min (by C-G formula based on SCr of 0.93 mg/dL).  Assessment: 78 yo F presenting with pneumonia requiring intubation. Pt now with new afib RVR; no AC PTA. Pharmacy consulted to dose bivalirudin. Patient was previously on heparin with labile heparin levels/atypical dosing range.   aPTT this morning is on lower end of therapeutic (50sec). Will increase slightly to avoid going subtherapeutic and recheck. No signs/symptoms of bleeding or interruptions to the infusion. CBC stable.    Goal of Therapy:  aPTT 50-85 seconds Monitor platelets by anticoagulation protocol: Yes   Plan:  Increase bivalirudin to 0.11 mg/kg/hr  aPTT level 12 hours  Continue to monitor H&H and platelets  Nussen Pullin, PharmD PGY1 Clinical Pharmacist [facility]  07/04/2024 8:31 AM

## 2024-07-04 NOTE — Progress Notes (Signed)
 Patient vomited x1 tan/brown colored emesis. Immediate ETT suctioned x3 with no secretions noted. Oropharyngeal suctioned - moderate amount of brown/yellow secretions suctioned out. Patient given zofran .

## 2024-07-04 NOTE — Progress Notes (Signed)
 PHARMACY - ANTICOAGULATION CONSULT NOTE  Pharmacy Consult for IV bivalirudin Indication: atrial fibrillation  Allergies  Allergen Reactions   Ace Inhibitors Swelling   Diphenhydramine Other (See Comments)    Hyperactive/jittery    Doxycycline Hyclate Nausea And Vomiting   Codeine Nausea And Vomiting    Patient Measurements: Height: 5' 1 (154.9 cm) Weight: 75.4 kg (166 lb 3.6 oz) IBW/kg (Calculated) : 47.8 HEPARIN DW (KG): 63.1  Vital Signs: Temp: 97.6 F (36.4 C) (10/30 1945) Temp Source: Axillary (10/30 1945) BP: 128/64 (10/30 1903) Pulse Rate: 91 (10/30 1903)  Labs: Recent Labs    07/02/24 0434 07/02/24 0851 07/02/24 1048 07/02/24 1815 07/03/24 0551 07/03/24 1328 07/03/24 2240 07/04/24 0442 07/04/24 2029  HGB  --    < > 8.6*  --  8.6*  --   --  8.3*  --   HCT  --   --  28.9*  --  28.0*  --   --  27.4*  --   PLT  --   --  374  --  379  --   --  374  --   APTT  --    < >  --  105* 50*  --  56* 50* 48*  HEPARINUNFRC  --    < >  --  >1.10* 0.49 0.32  --   --   --   CREATININE 1.10*  --   --   --  1.09*  --   --  0.93  --    < > = values in this interval not displayed.    Estimated Creatinine Clearance: 48.5 mL/min (by C-G formula based on SCr of 0.93 mg/dL).  Assessment: 78 yo F presenting with pneumonia requiring intubation. Pt now with new afib RVR; no AC PTA. Pharmacy consulted to dose bivalirudin. Patient was previously on heparin with labile heparin levels/atypical dosing range.   aPTT this evening is below goal range at 48 seconds. No signs/symptoms of bleeding or interruptions to the infusion. CBC stable.    Goal of Therapy:  aPTT 50-85 seconds Monitor platelets by anticoagulation protocol: Yes   Plan:  Increase bivalirudin to 0.13 mg/kg/hr  aPTT level 12 hours  Continue to monitor H&H and platelets  Harlene Barlow, Berdine BIRCH, BCPS, BCCP Clinical Pharmacist  07/04/2024 9:48 PM   [facility] pharmacy phone numbers are listed on  [email or web address]

## 2024-07-05 ENCOUNTER — Inpatient Hospital Stay (HOSPITAL_COMMUNITY)

## 2024-07-05 DIAGNOSIS — J123 Human metapneumovirus pneumonia: Secondary | ICD-10-CM | POA: Diagnosis not present

## 2024-07-05 DIAGNOSIS — J9621 Acute and chronic respiratory failure with hypoxia: Secondary | ICD-10-CM | POA: Diagnosis not present

## 2024-07-05 DIAGNOSIS — J90 Pleural effusion, not elsewhere classified: Secondary | ICD-10-CM | POA: Diagnosis not present

## 2024-07-05 DIAGNOSIS — J441 Chronic obstructive pulmonary disease with (acute) exacerbation: Secondary | ICD-10-CM | POA: Diagnosis not present

## 2024-07-05 LAB — CBC
HCT: 27.6 % — ABNORMAL LOW (ref 36.0–46.0)
Hemoglobin: 8.4 g/dL — ABNORMAL LOW (ref 12.0–15.0)
MCH: 28.8 pg (ref 26.0–34.0)
MCHC: 30.4 g/dL (ref 30.0–36.0)
MCV: 94.5 fL (ref 80.0–100.0)
Platelets: 395 K/uL (ref 150–400)
RBC: 2.92 MIL/uL — ABNORMAL LOW (ref 3.87–5.11)
RDW: 13.9 % (ref 11.5–15.5)
WBC: 13.4 K/uL — ABNORMAL HIGH (ref 4.0–10.5)
nRBC: 0.7 % — ABNORMAL HIGH (ref 0.0–0.2)

## 2024-07-05 LAB — CULTURE, RESPIRATORY W GRAM STAIN

## 2024-07-05 LAB — BASIC METABOLIC PANEL WITH GFR
Anion gap: 14 (ref 5–15)
BUN: 45 mg/dL — ABNORMAL HIGH (ref 8–23)
CO2: 40 mmol/L — ABNORMAL HIGH (ref 22–32)
Calcium: 9.7 mg/dL (ref 8.9–10.3)
Chloride: 86 mmol/L — ABNORMAL LOW (ref 98–111)
Creatinine, Ser: 1.11 mg/dL — ABNORMAL HIGH (ref 0.44–1.00)
GFR, Estimated: 52 mL/min — ABNORMAL LOW (ref >60)
Glucose, Bld: 145 mg/dL — ABNORMAL HIGH (ref 70–99)
Potassium: 4.5 mmol/L (ref 3.5–5.1)
Sodium: 140 mmol/L (ref 135–145)

## 2024-07-05 LAB — GLUCOSE, CAPILLARY
Glucose-Capillary: 114 mg/dL — ABNORMAL HIGH (ref 70–99)
Glucose-Capillary: 120 mg/dL — ABNORMAL HIGH (ref 70–99)
Glucose-Capillary: 133 mg/dL — ABNORMAL HIGH (ref 70–99)
Glucose-Capillary: 142 mg/dL — ABNORMAL HIGH (ref 70–99)
Glucose-Capillary: 148 mg/dL — ABNORMAL HIGH (ref 70–99)
Glucose-Capillary: 84 mg/dL (ref 70–99)

## 2024-07-05 LAB — TRIGLYCERIDES: Triglycerides: 142 mg/dL (ref <150)

## 2024-07-05 LAB — APTT
aPTT: 52 s — ABNORMAL HIGH (ref 24–36)
aPTT: 57 s — ABNORMAL HIGH (ref 24–36)

## 2024-07-05 MED ORDER — ACETAMINOPHEN 325 MG PO TABS
650.0000 mg | ORAL_TABLET | Freq: Four times a day (QID) | ORAL | Status: DC | PRN
  Administered 2024-07-05 – 2024-07-06 (×2): 650 mg
  Filled 2024-07-05 (×2): qty 2

## 2024-07-05 MED ORDER — AMIODARONE HCL 200 MG PO TABS
200.0000 mg | ORAL_TABLET | Freq: Two times a day (BID) | ORAL | Status: DC
  Administered 2024-07-05 (×2): 200 mg
  Filled 2024-07-05 (×2): qty 1

## 2024-07-05 MED ORDER — ACETAZOLAMIDE SODIUM 500 MG IJ SOLR
500.0000 mg | Freq: Once | INTRAMUSCULAR | Status: AC
  Administered 2024-07-05: 500 mg via INTRAVENOUS
  Filled 2024-07-05: qty 500

## 2024-07-05 MED ORDER — STERILE WATER FOR INJECTION IJ SOLN
INTRAMUSCULAR | Status: AC
  Filled 2024-07-05: qty 10

## 2024-07-05 MED ORDER — FENTANYL CITRATE (PF) 50 MCG/ML IJ SOSY
25.0000 ug | PREFILLED_SYRINGE | INTRAMUSCULAR | Status: AC | PRN
Start: 2024-07-05 — End: 2024-07-06
  Administered 2024-07-05 – 2024-07-06 (×3): 50 ug via INTRAVENOUS
  Filled 2024-07-05 (×3): qty 1

## 2024-07-05 MED ORDER — KATE FARMS STANDARD 1.4 EN LIQD
1000.0000 mL | ENTERAL | Status: DC
  Administered 2024-07-05: 1000 mL
  Filled 2024-07-05 (×5): qty 1000

## 2024-07-05 MED ORDER — METOCLOPRAMIDE HCL 5 MG/ML IJ SOLN
5.0000 mg | Freq: Three times a day (TID) | INTRAMUSCULAR | Status: DC
  Administered 2024-07-05 – 2024-07-09 (×11): 5 mg via INTRAVENOUS
  Filled 2024-07-05 (×11): qty 2

## 2024-07-05 MED ORDER — LORAZEPAM 0.5 MG PO TABS: 0.5000 mg | ORAL_TABLET | Freq: Three times a day (TID) | ORAL | Status: DC

## 2024-07-05 MED ORDER — FUROSEMIDE 10 MG/ML IJ SOLN
60.0000 mg | Freq: Once | INTRAMUSCULAR | Status: AC
  Administered 2024-07-05: 60 mg via INTRAVENOUS
  Filled 2024-07-05: qty 6

## 2024-07-05 MED ORDER — BANATROL TF EN LIQD: 60.0000 mL | Freq: Two times a day (BID) | ENTERAL | Status: DC

## 2024-07-05 NOTE — IPAL (Signed)
  Interdisciplinary Goals of Care Family Meeting   Date carried out:: 07/05/2024  Location of the meeting: Bedside  Member's involved: Physician, Bedside Registered Nurse, and Family Member or next of kin  Durable Power of Attorney or acting medical decision maker: daughter    Discussion: We discussed goals of care for [name] .  We discussed that we have not made much progress with vent weaning this week. If we continue to not make progress, we need to consider tracheostomy without a second failed extubation due to prolonged time on vent. We discussed what a trach looks like, and for her that with baseline lung disease it is not a guarantee that she would fully wean from MV. Her chances of a more permanent trach and vent need are higher than average and hard to predict. She is fine with that.  Tentatively we will plan on trach next week if we continue to not make progress over the weekend.   Code status: Full Code- not discussed today  Disposition: Continue current acute care   Time spent for the meeting: 15 min  Katie Waller 07/05/2024, 11:46 AM

## 2024-07-05 NOTE — Progress Notes (Signed)
 eLink Physician-Brief Progress Note Patient Name: Katie Waller DOB: 11-Oct-1945 MRN: [id number]   Date of Service  07/05/2024  HPI/Events of Note  Fentanyl  infusion discontinued previously but order for boluses is on the [facility], patient cannot get boluses due to discontinued infusion, RN asking for PRN order for Fentanyl  in place of boluses that can not be given. No clear intent to discontinue all narcotics documented in the chart by Dr. Orval.  eICU Interventions  PRN Fentanyl  pushes ordered to replace the PRN bolus order and both infusion and bolus orders discontinued.        Katie Waller U Katie Waller 07/05/2024, 8:40 PM

## 2024-07-05 NOTE — Plan of Care (Signed)
  Problem: Clinical Measurements: Goal: Ability to maintain clinical measurements within normal limits will improve Outcome: Progressing Goal: Cardiovascular complication will be avoided Outcome: Progressing   Problem: Elimination: Goal: Will not experience complications related to bowel motility Outcome: Progressing Goal: Will not experience complications related to urinary retention Outcome: Progressing   Problem: Pain Managment: Goal: General experience of comfort will improve and/or be controlled Outcome: Progressing   Problem: Safety: Goal: Ability to remain free from injury will improve Outcome: Progressing   Problem: Nutrition: Goal: Adequate nutrition will be maintained Outcome: Not Progressing   Problem: Coping: Goal: Level of anxiety will decrease Outcome: Not Progressing

## 2024-07-05 NOTE — Progress Notes (Addendum)
 PHARMACY - ANTICOAGULATION CONSULT NOTE  Pharmacy Consult for IV bivalirudin Indication: atrial fibrillation  Allergies  Allergen Reactions   Ace Inhibitors Swelling   Diphenhydramine Other (See Comments)    Hyperactive/jittery    Doxycycline Hyclate Nausea And Vomiting   Codeine Nausea And Vomiting    Patient Measurements: Height: 5' 1 (154.9 cm) Weight: 73.9 kg (162 lb 14.7 oz) IBW/kg (Calculated) : 47.8 HEPARIN DW (KG): 63.1  Vital Signs: Temp: 99.3 F (37.4 C) (10/31 1945) Temp Source: Axillary (10/31 1945) BP: 125/60 (10/31 2104) Pulse Rate: 105 (10/31 2104)  Labs: Recent Labs    07/03/24 0551 07/03/24 1328 07/03/24 2240 07/04/24 0442 07/04/24 2029 07/05/24 0405 07/05/24 2218  HGB 8.6*  --   --  8.3*  --  8.4*  --   HCT 28.0*  --   --  27.4*  --  27.6*  --   PLT 379  --   --  374  --  395  --   APTT 50*  --    < > 50* 48* 52* 57*  HEPARINUNFRC 0.49 0.32  --   --   --   --   --   CREATININE 1.09*  --   --  0.93  --  1.11*  --    < > = values in this interval not displayed.    Estimated Creatinine Clearance: 40.2 mL/min (A) (by C-G formula based on SCr of 1.11 mg/dL (H)).  Assessment: 78 yo F presenting with pneumonia requiring intubation. Pt now with new afib RVR; no AC PTA. Pharmacy consulted to dose bivalirudin. Patient was previously on heparin with labile heparin levels/atypical dosing range.  APTT 57 is therapeutic on 0.15 mg/kg/hr.   Goal of Therapy:  aPTT 50-85 seconds Monitor platelets by anticoagulation protocol: Yes   Plan:  Continue bivalirudin 0.15 mg/kg/hr  Monitor daily aPTT, CBC, signs/symptoms of bleeding    Jinnie Door, PharmD, BCPS, BCCP Clinical Pharmacist  Please check AMION for all [facility] Pharmacy phone numbers After 10:00 PM, call Main Pharmacy [phone]

## 2024-07-05 NOTE — Progress Notes (Signed)
 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 Code stroke called. On arrival to ED patient emergently intubated for acute respiratory distress and airway protection. CT Head with no ICH or large infarct, CTA head and neck with no LVO. CXR concerning for a Pneumonia with visible BL lung apices on CTA demonstrating infection/inflammation. Code stroke cancelled with presentation suspicious for sepsis. Patient received 30ml/kg crystalloid, pan cultured, and started empirically on Cefepime/Vanc/Azithromycin . Of note, family reports patient has recently not felt well and likely has had a cold. PCCM consulted for ICU admission.   On exam in ED, patient febrile to >101 and hypotensive, likely multifactorial in setting of presumed sepsis and recent sedation on induction with underlying heart disease. Norepinephrine gtt was initiated for MAPs < 65 and patient was transferred to ICU with plans to gain central access for vasopressor administration and arterial line placement for hemodynamic monitoring.  Pertinent  Medical History  Per above   Significant Hospital Events: Including procedures, antibiotic start and stop dates in addition to other pertinent events   Found unresponsive at home->ED>Code stroke>intubated for ARD/airway protection>CTH/CTA negative>Code stroke cancelled>Hypotensive>started on pressors>Admit to ICU  Arterial line placed 10/21 10/22 and 10/23 unsuccessful extubation 10/24: Despite maximal Precedex and fentanyl   pushes, unable to safely and comfortably clean the patient up so added prn Versed and started fentanyl  infusion. Rectal tube added for diarrhea.  10/24: 2/3 BC resulted in staph capitis/epidermitis, extubated on BiPAP 10/25 patient remained on BiPAP for 24 hours, when she came off of BiPAP, could not tolerate became tachypneic, tachycardic went into respiratory distress requiring endotracheal reintubation 10/29 unsuccessful SBT, went briefly back into RVR 10/30 unsuccessful weaning from sedation as BP/HR increased considerably  Interim History / Subjective:  Failed SBT today  Objective    Blood pressure 127/62, pulse 86, temperature 97.9 F (36.6 C), temperature source Axillary, resp. rate (!) 21, height 5' 1 (1.549 m), weight 73.9 kg, SpO2 97%.    Vent Mode: PSV;CPAP FiO2 (%):  [40 %] 40 % Set Rate:  [15 bmp] 15 bmp Vt Set:  [380 mL] 380 mL PEEP:  [5 cmH20] 5 cmH20 Pressure Support:  [10 cmH20] 10 cmH20 Plateau Pressure:  [19 cmH20-25 cmH20] 25 cmH20   Intake/Output Summary (Last 24 hours) at 07/05/2024 1201 Last data filed at 07/05/2024 1001 Gross per 24 hour  Intake 2293.64 ml  Output 3650 ml  Net -1356.36 ml   Filed Weights   06/30/24 0500 07/01/24 0500 07/05/24 0500  Weight: 77.9 kg 75.4 kg 73.9 kg    Examination: General: Chronically-ill elderly woman lying in bed, NAD, sedated Lungs: Coarse lung sounds/wheezing bilaterally. 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 Will likely needs tracheostomy but for now will optimize her from a respiratory/cardiac standpoint. Rates are better with Amio infusion and Lopressor .  Zosyn per Pharmacy (Day 7/7) LTVV/VAP prevention/PAD protocol Nebulized Triple Therapy, hypertonic NS CPT for secretions Continue Lopressor  25 BID for A-Fib, on heparin gtt > Angiomax given fluctuating heparin levels Continue Prednisone  taper Continue TF Repeat BC NG after 5 days, 10/26 Tracheal aspirate showed rare candida, repeat 10/28 shows rare yeasts/mold likely contaminant Trend BMP, CBC, Triglycerides (Propofol ) Ativan  1 mg q 8  Fiber  Duoneb q 2 Added home gabapentin    CKD 3A Good UOP yesterday after IV Lasix  80 mg X 2. Contraction alkalosis today so will looked to mitigate with Diamox 500 and Lasix  60 X 1.   Hyperglycemia Continue 5 units Semglee + SSI   Labs   CBC: Recent Labs  Lab 07/01/24 1202 07/02/24 1048 07/03/24 0551 07/04/24 0442 07/05/24 0405  WBC 12.8* 15.0* 12.4* 11.8* 13.4*  HGB 9.3* 8.6* 8.6* 8.3* 8.4*  HCT 30.0* 28.9* 28.0* 27.4* 27.6*  MCV 94.0 97.0 94.6 95.8 94.5  PLT 332 374 379 374 395    Basic Metabolic Panel: Recent Labs  Lab 06/29/24 0312 06/29/24 0925 06/30/24 0500 06/30/24 0836 07/01/24 0249 07/02/24 0434 07/03/24 0551 07/04/24 0442 07/05/24 0405  NA 140   < > 143   < > 142 142 143 142 140  K 4.6   < > 3.8   < > 4.0 4.6 4.4 4.8 4.5  CL 102  --  102  --  103 104 97* 93* 86*  CO2 29  --  27  --  28 30 33* 37* 40*  GLUCOSE 135*  --  103*  --  196* 166* 239* 169* 145*  BUN 39*  --  44*  --  43* 44* 44* 45* 45*  CREATININE 1.18*  --  1.29*  --  1.14* 1.10* 1.09* 0.93 1.11*  CALCIUM  9.0  --  8.8*  --  8.7* 8.5* 9.2 9.1 9.7  MG 2.7*  --  2.5*  --   --  2.4  --   --   --   PHOS 3.2  --  3.0  --   --   --   --   --   --    < > = values in this interval not displayed.   GFR: Estimated Creatinine Clearance: 40.2 mL/min (A) (by C-G formula based on SCr of 1.11 mg/dL  (H)). Recent Labs  Lab 06/29/24 0312 07/01/24 0249 07/01/24 1202 07/02/24 1048 07/03/24 0551 07/04/24 0442 07/05/24 0405  PROCALCITON 0.82 1.61  --   --   --   --   --   WBC 9.1  --    < > 15.0* 12.4* 11.8* 13.4*   < > = values in this interval not displayed.    Liver Function Tests: Recent Labs  Lab 06/29/24 0312  AST 59*  ALT 128*  ALKPHOS 40  BILITOT 0.4  PROT 5.4*  ALBUMIN 2.6*   No results for input(s): LIPASE, AMYLASE in the last 168 hours. No results for input(s): AMMONIA in the last 168 hours.  ABG    Component Value Date/Time   PHART 7.458 (H) 06/30/2024 0836   PCO2ART 41.9 06/30/2024 0836   PO2ART 204 (H) 06/30/2024 0836   HCO3 29.6 (H) 06/30/2024 0836   TCO2 31 06/30/2024 0836   O2SAT 100 06/30/2024 0836     Coagulation Profile: No results for input(s): INR, PROTIME  in the last 168 hours.  Cardiac Enzymes: No results for input(s): CKTOTAL, CKMB, CKMBINDEX, TROPONINI in the last 168 hours.  HbA1C: Hgb A1c MFr Bld  Date/Time Value Ref Range Status  06/26/2024 04:47 AM 5.6 4.8 - 5.6 % Final    Comment:    (NOTE) Diagnosis of Diabetes The following HbA1c ranges recommended by the American Diabetes Association (ADA) may be used as an aid in the diagnosis of diabetes mellitus.  Hemoglobin             Suggested A1C NGSP%              Diagnosis  <5.7                   Non Diabetic  5.7-6.4                Pre-Diabetic  >6.4                   Diabetic  <7.0                   Glycemic control for                       adults with diabetes.    09/17/2013 01:57 PM 6.1 (H) <5.7 % Final    Comment:    (NOTE)                                                                       According to the ADA Clinical Practice Recommendations for 2011, when HbA1c is used as a screening test:  >=6.5%   Diagnostic of Diabetes Mellitus           (if abnormal result is confirmed) 5.7-6.4%   Increased risk of developing Diabetes  [place] References:Diagnosis and Classification of Diabetes Mellitus,Diabetes Care,2011,34(Suppl 1):S62-S69 and Standards of Medical Care in         Diabetes - 2011,Diabetes Care,2011,34 (Suppl 1):S11-S61.    CBG: Recent Labs  Lab 07/04/24 1957 07/04/24 2337 07/05/24 0328 07/05/24 0829 07/05/24 1155  GLUCAP 130* 137* 148* 120* 133*    Review of Systems:     Past Medical History:  She,  has a past medical history of Abnormal finding on EKG (09/19/2013), Acute on chronic respiratory failure with hypercapnia (HCC), Acute respiratory failure (HCC) (06/08/2016), Anxiety (11/09/2020), Arthritis, Asthma, Carotid bruit (11/09/2020), Chronic diastolic CHF (congestive heart failure) (HCC) (07/28/2017), Chronic obstructive pulmonary disease, unspecified (HCC) (11/09/2020), COPD, group D, by GOLD 2017 classification (HCC) (09/17/2013), Cough (11/09/2020), Decreased estrogen level (11/09/2020), Diabetes (HCC), Dyspnea, Edema (11/09/2020), Essential hypertension (09/18/2013), Goals of care, counseling/discussion, Hardening of the aorta (main artery of the heart) (11/09/2020), Heart disease, History of radiation therapy, Hypertension, Hypertensive heart failure (HCC) (11/09/2020), Hypoxia (11/09/2020), Insomnia (11/09/2020), Iron deficiency anemia (11/09/2020), Large liver (11/09/2020), Near syncope (09/17/2013), Osteoporosis (11/09/2020), Palliative care encounter, Prediabetes (09/19/2013), Pure hypercholesterolemia (11/09/2020), Raynaud's disease (11/09/2020), Sinus tachycardia (09/18/2013), Skin sensation disturbance (11/09/2020), Smoker (09/17/2013), 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   Surgical History:   Past Surgical History:  Procedure Laterality Date   BRONCHIAL BIOPSY  05/25/2023   Procedure: BRONCHIAL BIOPSIES;  Surgeon: Brenna,  Adine CROME, DO;  Location: [facility];  Service:  Pulmonary;;   BRONCHIAL NEEDLE ASPIRATION BIOPSY  05/25/2023   Procedure: BRONCHIAL NEEDLE ASPIRATION BIOPSIES;  Surgeon: Brenna Adine CROME, DO;  Location: [facility];  Service: Pulmonary;;   FIDUCIAL MARKER PLACEMENT  05/25/2023   Procedure: FIDUCIAL MARKER PLACEMENT;  Surgeon: Brenna Adine CROME, DO;  Location: [facility];  Service: Pulmonary;;   KNEE SURGERY Left      Social History:   reports that she quit smoking about 6 years ago. Her smoking use included cigarettes. She started smoking about 6 years ago. She has a 55 pack-year smoking history. She has never used smokeless tobacco. She reports that she does not drink alcohol and does not use drugs.   Family History:  Her family history includes Hyperlipidemia in her father and mother; Hypertension in her father and mother.   Allergies Allergies  Allergen Reactions   Ace Inhibitors Swelling   Diphenhydramine Other (See Comments)    Hyperactive/jittery    Doxycycline Hyclate Nausea And Vomiting   Codeine Nausea And Vomiting     Home Medications  Prior to Admission medications   Medication Sig Start Date End Date Taking? Authorizing Provider  acetaminophen  (TYLENOL ) 500 MG tablet Take 1,000 mg by mouth every 6 (six) hours as needed for moderate pain.   Yes [provider]  albuterol  (PROVENTIL  HFA;VENTOLIN  HFA) 108 (90 BASE) MCG/ACT inhaler Inhale 2 puffs into the lungs every 6 (six) hours as needed for wheezing or shortness of breath. 09/19/13  Yes Johnson, Clanford L, MD  aspirin  81 MG chewable tablet Chew 1 tablet (81 mg total) by mouth daily. 09/19/13  Yes Johnson, Clanford L, MD  atorvastatin  (LIPITOR) 10 MG tablet TAKE 1 TABLET BY MOUTH EVERY DAY 07/21/23  Yes Krasowski, Robert J, MD  benzonatate  (TESSALON ) 100 MG capsule Take 100 mg by mouth 3 (three) times daily as needed for cough.    Yes [provider]  Biotin 10 MG CAPS Take 10 mg by mouth daily.   Yes [provider]  budesonide -formoterol   (SYMBICORT ) 160-4.5 MCG/ACT inhaler Inhale 2 puffs into the lungs 2 (two) times daily. 08/23/19  Yes Mannam, Praveen, MD  calcium  carbonate (OSCAL) 1500 (600 Ca) MG TABS tablet Take 600 mg of elemental calcium  by mouth daily.   Yes [provider]  Cholecalciferol (VITAMIN D ) 50 MCG (2000 UT) tablet Take 2,000 Units by mouth daily.   Yes [provider]  diphenhydramine-acetaminophen  (TYLENOL  PM) 25-500 MG TABS tablet Take 1 tablet by mouth at bedtime.   Yes [provider]  furosemide  (LASIX ) 40 MG tablet Take 40 mg by mouth daily.   Yes [provider]  gabapentin (NEURONTIN) 300 MG capsule Take 900 mg by mouth at bedtime.   Yes [provider]  iron polysaccharides (NIFEREX) 150 MG capsule Take 150 mg by mouth daily. 11/05/20  Yes [provider]  loratadine (CLARITIN) 10 MG tablet Take 10 mg by mouth daily as needed for allergies.   Yes [provider]  LORazepam  (ATIVAN ) 1 MG tablet Take 1 tablet (1 mg total) by mouth every 6 (six) hours as needed for anxiety. 08/07/17  Yes Drusilla Sabas RAMAN, MD  losartan  (COZAAR ) 50 MG tablet Take 50 mg by mouth daily.   Yes [provider]  Multiple Vitamin (MULTIVITAMIN WITH MINERALS) TABS tablet Take 1 tablet by mouth daily.   Yes [provider]  raloxifene  (EVISTA ) 60 MG tablet Take 60 mg by mouth every morning. 08/07/20  Yes [provider]  Tiotropium Bromide  Monohydrate (SPIRIVA  RESPIMAT) 2.5 MCG/ACT AERS Inhale 1 puff into the lungs daily.   Yes [provider]  traZODone  (DESYREL ) 100 MG tablet Take 1 tablet (100 mg total) by mouth at bedtime as needed for sleep. 08/07/17  Yes Drusilla Sabas RAMAN, MD     Norman Lobstein, DO

## 2024-07-05 NOTE — Progress Notes (Signed)
 PHARMACY - ANTICOAGULATION CONSULT NOTE  Pharmacy Consult for IV bivalirudin Indication: atrial fibrillation  Allergies  Allergen Reactions   Ace Inhibitors Swelling   Diphenhydramine Other (See Comments)    Hyperactive/jittery    Doxycycline Hyclate Nausea And Vomiting   Codeine Nausea And Vomiting    Patient Measurements: Height: 5' 1 (154.9 cm) Weight: 73.9 kg (162 lb 14.7 oz) IBW/kg (Calculated) : 47.8 HEPARIN DW (KG): 63.1  Vital Signs: Temp: 96.9 F (36.1 C) (10/31 0840) Temp Source: Axillary (10/31 0840) BP: 109/65 (10/31 0900) Pulse Rate: 78 (10/31 0800)  Labs: Recent Labs    07/02/24 1815 07/03/24 0551 07/03/24 1328 07/03/24 2240 07/04/24 0442 07/04/24 2029 07/05/24 0405  HGB  --  8.6*  --   --  8.3*  --  8.4*  HCT  --  28.0*  --   --  27.4*  --  27.6*  PLT  --  379  --   --  374  --  395  APTT 105* 50*  --    < > 50* 48* 52*  HEPARINUNFRC >1.10* 0.49 0.32  --   --   --   --   CREATININE  --  1.09*  --   --  0.93  --  1.11*   < > = values in this interval not displayed.    Estimated Creatinine Clearance: 40.2 mL/min (A) (by C-G formula based on SCr of 1.11 mg/dL (H)).  Assessment: 78 yo F presenting with pneumonia requiring intubation. Pt now with new afib RVR; no AC PTA. Pharmacy consulted to dose bivalirudin. Patient was previously on heparin with labile heparin levels/atypical dosing range.   aPTT this morning is on lower end of therapeutic (52sec) after dose increase. Will increase slightly to avoid going subtherapeutic and recheck. No signs/symptoms of bleeding or interruptions to the infusion. CBC stable.    Goal of Therapy:  aPTT 50-85 seconds Monitor platelets by anticoagulation protocol: Yes   Plan:  Increase bivalirudin to 0.15 mg/kg/hr  aPTT level 12 hours  Continue to monitor H&H and platelets  Bastion Bolger, PharmD PGY1 Clinical Pharmacist [facility]  07/05/2024 9:42 AM

## 2024-07-05 NOTE — Progress Notes (Signed)
 Nutrition Follow-up  DOCUMENTATION CODES:  Not applicable  INTERVENTION:  Continue tube feeding via OGT: Mallie Pinion Standard 1.4 at 45 ml/h (1080 ml per day) Prosource TF20 60 ml 1x/d Provides 1592 kcal, 87 gm protein, 778 ml free water daily Banatrol BID-provides 45kcal, 5g soluble fiber and 2g protein per serving.  NUTRITION DIAGNOSIS:  Inadequate oral intake related to acute illness as evidenced by NPO status. - remains applicable   GOAL:  Patient will meet greater than or equal to 90% of their needs - progressing  MONITOR:  Vent status, Labs, Weight trends, TF tolerance  REASON FOR ASSESSMENT:  Ventilator    ASSESSMENT:  Pt admitted with AMS, sepsis, ARF. 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  10/21 admitted d/t PNA, HMPV infection; intubated; stroke/sz ruled out 10/23 failed vent wean; initiate tube feeds 10/24 - extubated 10/25 - reintubated  Pt resting in bed at the time of assessment. Family members at bedside. Pt with ongoing loose stools and had 2 instances of vomiting yesterday. TF were held but ok to resume per MD. Will adjust formula to Mallie Pinion to see if it is better tolerated as pt having GI distress. Decreased banatrol to BID as new formula contains some fiber as well. TF+banatrol = 26g of fiber.   Pt with trouble weaning from vent, GOC discussions are ongoing on if pt will need a tracheostomy. Possible decision early next week.     MV: 3.1 L/min Temp (24hrs), Avg:98.1 F (36.7 C), Min:96.9 F (36.1 C), Max:99.3 F (37.4 C) MAP (cuff): 63-167mmHg  Admit weight: 71 kg   Current weight: 73.9 kg    Intake/Output Summary (Last 24 hours) at 07/05/2024 1201 Last data filed at 07/05/2024 1001 Gross per 24 hour  Intake 2293.64 ml  Output 3650 ml  Net -1356.36 ml  Net IO Since Admission: 4,156.09 mL [07/05/24 1201]  Drains/Lines: CVC Right IJ  Triple Lumen OGT (gastric per imaging) FMS x 24 hours UOP x 24 hours  Diet Order Hx: 10/21 - current: NPO since admission 10/23 - TF initiated via OGT 10/24 - TF stopped with extubation 10/26 - TF restarted 10/30 - pt with emesis x 2, TF held 10/31 - TF resumed, changed to [employer] Relevant Medications: Scheduled Meds:  PROSource TF20  60 mL Per Tube Daily   BANATROL TF  60 mL Per Tube QID   furosemide   60 mg Intravenous Once   insulin aspart  0-9 Units Subcutaneous Q4H   insulin glargine-yfgn  8 Units Subcutaneous QHS   pantoprazole IV  40 mg Intravenous Q24H   predniSONE   10 mg Per Tube Q breakfast   Continuous Infusions:  bivalirudin (ANGIOMAX) 250 mg in sodium chloride  0.9 % 500 mL (0.5 mg/mL) infusion 0.1499 mg/kg/hr (07/05/24 1001)   feeding supplement (OSMOLITE 1.2 CAL) 45 mL/hr at 07/05/24 1001   piperacillin-tazobactam (ZOSYN)  IV Stopped (07/05/24 0940)   PRN Meds: ondansetron ,  polyethylene glycol, simethicone  Labs Reviewed: Chloride 86 BUN 45, creatinine 1.11 CBG ranges from 89-201 mg/dL over the last 24 hours HgbA1c 5.6%  NUTRITION - FOCUSED PHYSICAL EXAM: Flowsheet Row Most Recent Value  Orbital Region No depletion  Upper Arm Region No depletion  Thoracic and Lumbar Region No depletion  Buccal Region Unable to assess  Temple Region Mild depletion  Clavicle Bone Region Mild depletion  Clavicle and Acromion Bone Region No depletion  Scapular Bone Region  No depletion  Dorsal Hand Unable to assess  [hand mits]  Patellar Region No depletion  Anterior Thigh Region No depletion  Posterior Calf Region No depletion  Edema (RD Assessment) None  Hair Reviewed  Eyes Unable to assess  Mouth Unable to assess  Skin Reviewed  Nails Unable to assess    Diet Order:   Diet Order             Diet NPO time specified  Diet effective now                   EDUCATION NEEDS:  No education needs have been identified at this  time  Skin:  Skin Assessment: Reviewed RN Assessment Skin tear: - medial perineum (0.75 cm) MASD: - coccyx (1 x 0.1 cm)  Last BM:  10/31 FMS placed on 10/24  Height:  Ht Readings from Last 1 Encounters:  06/30/24 5' 1 (1.549 m)    Weight:  Wt Readings from Last 1 Encounters:  07/05/24 73.9 kg    Ideal Body Weight:  47.7 kg  BMI:  Body mass index is 30.78 kg/m.  Estimated Nutritional Needs:  Kcal:  1500-1700 kcal/d Protein:  75-90 g/d Fluid:  >/=1.5L    Vernell Lukes, RD, LDN, CNSC Registered Dietitian II Please reach out via secure chat

## 2024-07-06 DIAGNOSIS — J123 Human metapneumovirus pneumonia: Secondary | ICD-10-CM | POA: Diagnosis not present

## 2024-07-06 DIAGNOSIS — J441 Chronic obstructive pulmonary disease with (acute) exacerbation: Secondary | ICD-10-CM | POA: Diagnosis not present

## 2024-07-06 DIAGNOSIS — J90 Pleural effusion, not elsewhere classified: Secondary | ICD-10-CM | POA: Diagnosis not present

## 2024-07-06 DIAGNOSIS — J9621 Acute and chronic respiratory failure with hypoxia: Secondary | ICD-10-CM | POA: Diagnosis not present

## 2024-07-06 LAB — CBC
HCT: 28.8 % — ABNORMAL LOW (ref 36.0–46.0)
Hemoglobin: 9 g/dL — ABNORMAL LOW (ref 12.0–15.0)
MCH: 28.9 pg (ref 26.0–34.0)
MCHC: 31.3 g/dL (ref 30.0–36.0)
MCV: 92.6 fL (ref 80.0–100.0)
Platelets: 487 K/uL — ABNORMAL HIGH (ref 150–400)
RBC: 3.11 MIL/uL — ABNORMAL LOW (ref 3.87–5.11)
RDW: 13.7 % (ref 11.5–15.5)
WBC: 17.2 K/uL — ABNORMAL HIGH (ref 4.0–10.5)
nRBC: 0.2 % (ref 0.0–0.2)

## 2024-07-06 LAB — GLUCOSE, CAPILLARY
Glucose-Capillary: 83 mg/dL (ref 70–99)
Glucose-Capillary: 105 mg/dL — ABNORMAL HIGH (ref 70–99)
Glucose-Capillary: 90 mg/dL (ref 70–99)
Glucose-Capillary: 89 mg/dL (ref 70–99)
Glucose-Capillary: 90 mg/dL (ref 70–99)
Glucose-Capillary: 93 mg/dL (ref 70–99)

## 2024-07-06 LAB — BASIC METABOLIC PANEL WITH GFR
Anion gap: 15 (ref 5–15)
BUN: 41 mg/dL — ABNORMAL HIGH (ref 8–23)
CO2: 37 mmol/L — ABNORMAL HIGH (ref 22–32)
Calcium: 9.9 mg/dL (ref 8.9–10.3)
Chloride: 87 mmol/L — ABNORMAL LOW (ref 98–111)
Creatinine, Ser: 1.18 mg/dL — ABNORMAL HIGH (ref 0.44–1.00)
GFR, Estimated: 48 mL/min — ABNORMAL LOW (ref >60)
Glucose, Bld: 94 mg/dL (ref 70–99)
Potassium: 3.5 mmol/L (ref 3.5–5.1)
Sodium: 139 mmol/L (ref 135–145)

## 2024-07-06 LAB — MRSA NEXT GEN BY PCR, NASAL: MRSA by PCR Next Gen: NOT DETECTED

## 2024-07-06 LAB — MAGNESIUM: Magnesium: 2.1 mg/dL (ref 1.7–2.4)

## 2024-07-06 LAB — APTT: aPTT: 56 s — ABNORMAL HIGH (ref 24–36)

## 2024-07-06 MED ORDER — VANCOMYCIN HCL 1500 MG/300ML IV SOLN
1500.0000 mg | Freq: Once | INTRAVENOUS | Status: AC
  Administered 2024-07-06: 1500 mg via INTRAVENOUS
  Filled 2024-07-06: qty 300

## 2024-07-06 MED ORDER — VANCOMYCIN HCL 750 MG/150ML IV SOLN
750.0000 mg | INTRAVENOUS | Status: DC
  Administered 2024-07-07: 750 mg via INTRAVENOUS
  Filled 2024-07-06 (×2): qty 150

## 2024-07-06 MED ORDER — GABAPENTIN 300 MG PO CAPS: 600.0000 mg | ORAL_CAPSULE | Freq: Every evening | ORAL | Status: DC | PRN

## 2024-07-06 MED ORDER — SODIUM CHLORIDE 0.9 % IV SOLN
2.0000 g | Freq: Two times a day (BID) | INTRAVENOUS | Status: DC
  Administered 2024-07-06 – 2024-07-08 (×5): 2 g via INTRAVENOUS
  Filled 2024-07-06 (×5): qty 12.5

## 2024-07-06 MED ORDER — AMIODARONE HCL IN DEXTROSE 360-4.14 MG/200ML-% IV SOLN
30.0000 mg/h | INTRAVENOUS | Status: DC
  Administered 2024-07-06 – 2024-07-08 (×5): 30 mg/h via INTRAVENOUS
  Filled 2024-07-06 (×5): qty 200

## 2024-07-06 MED ORDER — LORAZEPAM 0.5 MG PO TABS
0.5000 mg | ORAL_TABLET | Freq: Three times a day (TID) | ORAL | Status: DC | PRN
Start: 2024-07-06 — End: 2024-07-09

## 2024-07-06 MED ORDER — LABETALOL HCL 5 MG/ML IV SOLN
10.0000 mg | INTRAVENOUS | Status: DC | PRN
  Administered 2024-07-06 – 2024-07-17 (×12): 10 mg via INTRAVENOUS
  Filled 2024-07-06 (×11): qty 4

## 2024-07-06 MED ORDER — METOPROLOL TARTRATE 5 MG/5ML IV SOLN
5.0000 mg | Freq: Four times a day (QID) | INTRAVENOUS | Status: DC
  Administered 2024-07-06 – 2024-07-08 (×9): 5 mg via INTRAVENOUS
  Filled 2024-07-06 (×9): qty 5

## 2024-07-06 MED ORDER — FENTANYL CITRATE (PF) 50 MCG/ML IJ SOSY
25.0000 ug | PREFILLED_SYRINGE | INTRAMUSCULAR | Status: DC | PRN
  Administered 2024-07-06 – 2024-07-07 (×2): 50 ug via INTRAVENOUS
  Filled 2024-07-06 (×2): qty 1

## 2024-07-06 NOTE — Progress Notes (Signed)
 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 Code stroke called. On arrival to ED patient emergently intubated for acute respiratory distress and airway protection. CT Head with no ICH or large infarct, CTA head and neck with no LVO. CXR concerning for a Pneumonia with visible BL lung apices on CTA demonstrating infection/inflammation. Code stroke cancelled with presentation suspicious for sepsis. Patient received 30ml/kg crystalloid, pan cultured, and started empirically on Cefepime/Vanc/Azithromycin . Of note, family reports patient has recently not felt well and likely has had a cold. PCCM consulted for ICU admission.   On exam in ED, patient febrile to >101 and hypotensive, likely multifactorial in setting of presumed sepsis and recent sedation on induction with underlying heart disease. Norepinephrine gtt was initiated for MAPs < 65 and patient was transferred to ICU with plans to gain central access for vasopressor administration and arterial line placement for hemodynamic monitoring.  Pertinent  Medical History  Per above   Significant Hospital Events: Including procedures, antibiotic start and stop dates in addition to other pertinent events   Found unresponsive at home->ED>Code stroke>intubated for ARD/airway protection>CTH/CTA negative>Code stroke cancelled>Hypotensive>started on pressors>Admit to ICU  Arterial line placed 10/21 10/22 and 10/23 unsuccessful extubation 10/24: Despite maximal Precedex and fentanyl   pushes, unable to safely and comfortably clean the patient up so added prn Versed and started fentanyl  infusion. Rectal tube added for diarrhea.  10/24: 2/3 BC resulted in staph capitis/epidermitis, extubated on BiPAP 10/25 patient remained on BiPAP for 24 hours, when she came off of BiPAP, could not tolerate became tachypneic, tachycardic went into respiratory distress requiring endotracheal reintubation 10/29 unsuccessful SBT, went briefly back into RVR 10/30 unsuccessful weaning from sedation as BP/HR increased considerably  Interim History / Subjective:  This morning feels groggy. All IV sedation is off.  Tmax  99.7   Objective    Blood pressure 125/68, pulse 94, temperature 99 F (37.2 C), temperature source Axillary, resp. rate 15, height 5' 1 (1.549 m), weight 70.4 kg, SpO2 100%.    Vent Mode: PRVC FiO2 (%):  [40 %] 40 % Set Rate:  [15 bmp] 15 bmp Vt Set:  [380 mL] 380 mL PEEP:  [5 cmH20] 5 cmH20 Pressure Support:  [10 cmH20] 10 cmH20 Plateau Pressure:  [24 cmH20] 24 cmH20   Intake/Output Summary (Last 24 hours) at 07/06/2024 0759 Last data filed at 07/06/2024 0700 Gross per 24 hour  Intake 896.61 ml  Output 2950 ml  Net -2053.39 ml   Filed Weights   07/01/24 0500 07/05/24 0500 07/06/24 0500  Weight: 75.4 kg 73.9 kg 70.4 kg    Examination: General: critically ill appearing woman lying in bed in NAD HEENT: [place]/AT, ETT Lungs: breathing comfortably on MV, no wheezing. Minimal thick white secretions from ETT.  Cardiovascular: S1S2, RRR Abd soft, NT Extremities: no significant edema Neuro: Awake, alert, moving all extremities. RASS -2, nods to answer questions.    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 Will try again later today. All sedation currently off.  -repeat trach aspirate culture -changing antibiotics with fever yesterday and rising WBC- cefepime, vanc, repeat MRSA nares -con't brovana , yupelri, pulmicort  -steroids tapering  Sepsis due to bilateral multifocal pneumonia with metapneumovirus and probable superimposed bacterial pneumonia -changed antibiotics today- cefepime, vanc -repeat trach aspirate culture -CVC out  Afib with RVR, new onset, paroxysmal Chronic HFpEF -change back to IV amio with ileus -bival -tele monitoring -hold metoprolol  while NGT to suction  Sedation related hypotension -con't   Anemia- stable -transfuse for Hb <7 or hemodynamically significant bleeding  Ileus; previously had diarrhea; may be from fiber given to treat diarrhea -OGT to LIWS; would need NGT before extubation  Obesity -long term recommend modest weight loss  Hyperglycemia--better with reduced steroids and TF off -stop long acting insulin -SSI PRN   CKD 3A -with ileus and high NGT output, hold diuresis today   Daughter and son updated at bedside this morning.   Labs   CBC: Recent Labs  Lab 07/02/24 1048 07/03/24 0551 07/04/24 0442 07/05/24 0405 07/06/24 0515  WBC 15.0* 12.4* 11.8* 13.4* 17.2*  HGB 8.6* 8.6* 8.3* 8.4* 9.0*  HCT 28.9* 28.0* 27.4* 27.6* 28.8*  MCV 97.0 94.6 95.8 94.5 92.6  PLT 374 379 374 395 487*    Basic Metabolic Panel: Recent Labs  Lab 06/30/24 0500 06/30/24 0836 07/02/24 0434 07/03/24 0551 07/04/24 0442 07/05/24 0405 07/06/24 0515  NA 143   < > 142 143 142 140 139  K 3.8   < > 4.6 4.4 4.8 4.5 3.5  CL 102   < > 104 97* 93* 86* 87*  CO2 27   < > 30 33* 37* 40* 37*  GLUCOSE 103*   < > 166* 239* 169* 145* 94  BUN 44*   < > 44* 44* 45* 45* 41*  CREATININE 1.29*   < > 1.10* 1.09* 0.93 1.11* 1.18*  CALCIUM  8.8*   < > 8.5* 9.2 9.1 9.7 9.9  MG 2.5*   --  2.4  --   --   --  2.1  PHOS 3.0  --   --   --   --   --   --    < > = values in this interval not displayed.   GFR: Estimated Creatinine Clearance: 36.9 mL/min (A) (by C-G formula based on SCr of 1.18 mg/dL (H)). Recent Labs  Lab 07/01/24 0249 07/01/24 1202 07/03/24 0551 07/04/24 0442 07/05/24 0405 07/06/24 0515  PROCALCITON 1.61  --   --   --   --   --   WBC  --    < > 12.4* 11.8* 13.4* 17.2*   < > = values in this interval not displayed.   This patient is critically ill with multiple organ system failure which requires frequent high complexity decision making, assessment, support, evaluation, and titration of therapies. This was completed through the application of advanced monitoring technologies and extensive interpretation of multiple databases. During this encounter critical care time was devoted to patient care services described in this note for 36 minutes.  Leita SHAUNNA Gaskins, DO 07/06/24 10:39 AM [facility]  For contact information, see Amion. If no response to pager, please call PCCM consult pager. After hours, 7PM- 7AM,  please call Elink.

## 2024-07-06 NOTE — Plan of Care (Signed)
  Problem: Clinical Measurements: Goal: Ability to maintain clinical measurements within normal limits will improve Outcome: Progressing Goal: Will remain free from infection Outcome: Progressing Goal: Diagnostic test results will improve Outcome: Progressing Goal: Respiratory complications will improve Outcome: Progressing Goal: Cardiovascular complication will be avoided Outcome: Progressing   Problem: Nutritional: Goal: Maintenance of adequate nutrition will improve Outcome: Progressing   Problem: Skin Integrity: Goal: Risk for impaired skin integrity will decrease Outcome: Progressing   Problem: Respiratory: Goal: Ability to maintain adequate ventilation will improve Outcome: Progressing

## 2024-07-06 NOTE — Progress Notes (Addendum)
 Pharmacy Antibiotic Note  LATRIA MCCARRON is a 78 y.o. female admitted on 06/25/2024 with pneumonia.  Pharmacy has been consulted for Cefepime and Vancomycin dosing.  Patient has been on Zosyn therapy. WBC is trending up today at 17.2 (steroids tapering off) and temperature curve trending up. Re-broadening antibiotics. SCr stable at 1.18. Good urine output with diuresis.   Plan: Cefepime 2g IV every 12 hours Vancomycin 1500 mg IV x1 now then 750mg  IV every 24 hours. cAUC 490. Monitor renal function, culture results, and clinical status.   Height: 5' 1 (154.9 cm) Weight: 70.4 kg (155 lb 3.3 oz) IBW/kg (Calculated) : 47.8  Temp (24hrs), Avg:98.6 F (37 C), Min:96.9 F (36.1 C), Max:99.7 F (37.6 C)  Recent Labs  Lab 07/02/24 0434 07/02/24 1048 07/03/24 0551 07/04/24 0442 07/05/24 0405 07/06/24 0515  WBC  --  15.0* 12.4* 11.8* 13.4* 17.2*  CREATININE 1.10*  --  1.09* 0.93 1.11* 1.18*    Estimated Creatinine Clearance: 36.9 mL/min (A) (by C-G formula based on SCr of 1.18 mg/dL (H)).    Allergies  Allergen Reactions   Ace Inhibitors Swelling   Diphenhydramine Other (See Comments)    Hyperactive/jittery    Doxycycline Hyclate Nausea And Vomiting   Codeine Nausea And Vomiting    Antimicrobials this admission: Zosyn 10/26 >>10/31 CRO 10/22>10/24 Cefepime 10/21 Azithro 10/21>10/23 Flagyl 10/21 x1 Vanc 10/21 x1  Dose adjustments this admission: N/a  Microbiology results: 11/1 MRSA PCR >> 11/1 Respiratory culture >> 10/26 tracheal aspirate: rare fungus (mold), likely contaminant; contact micro if further ID needed 10/24 Bcx: contaminant (staph epi, staph cap) 10/22: metapneumovirus (+) 10/22: legionella (-) 10/21 BCX: BCID staph 10/21 MRSA (-) 10/21 Resp panel (-)  Thank you for allowing pharmacy to be a part of this patient's care.  Harlene Boga, PharmD, BCPS, BCCCP Clinical Pharmacist Please refer to [facility] for [facility] Pharmacy numbers 07/06/2024 8:09 AM

## 2024-07-06 NOTE — Progress Notes (Signed)
 RT note. Patient placed on SBT at this time with the following settings. Patient tolerating well, RN aware, RT will continue to monitor.    07/06/24 1428  Vent Select  Invasive or Noninvasive Invasive  Adult Vent Y  Airway 7.5 mm  Placement Date/Time: 06/29/24 1228   Placed By: (c) ICU physician  Airway Device: Endotracheal Tube  Laryngoscope Blade: (c) 3  ETT Types: Oral  Size (mm): 7.5 mm  Cuffed: Cuffed  Insertion attempts: 1  Airway Equipment: Video Laryngoscope  Placement ...  Secured at (cm) 25 cm  Measured From Lips  Secured Location Right  Secured By English As A Second Language Teacher No  Tube Holder Repositioned Yes  Prone position No  Cuff Pressure (cm H2O) Clear OR 27-39 CmH2O  Site Condition Drainage (Comment) (oral secretions)  Adult Ventilator Settings  Vent Type Servo i  Humidity HME  Vent Mode (S)  PSV;CPAP  FiO2 (%) 40 %  Pressure Support 8 cmH20  PEEP 5 cmH20  Adult Ventilator Measurements  Peak Airway Pressure 14 L/min  Mean Airway Pressure 7 cmH20  Resp Rate Spontaneous 11 br/min  Resp Rate Total 11 br/min  Spont TV 451 mL  Measured Ve 4.4 L  Auto PEEP 0 cmH20  Total PEEP 5 cmH20  SpO2 100 %  Adult Ventilator Alarms  Alarms On Y  Ve High Alarm 21 L/min  Ve Low Alarm 4 L/min  Resp Rate High Alarm 40 br/min  Resp Rate Low Alarm 8  PEEP Low Alarm 3 cmH2O  Press High Alarm 40 cmH2O  T Apnea 20 sec(s)  VAP Prevention  HOB> 30 Degrees Y  Equipment wiped down Yes  Daily Weaning Assessment  Daily Assessment of Readiness to Wean Wean protocol criteria met (SBT performed)  SBT Method CPAP 5 cm H20 and PS 5 cm H20  Weaning Start Time 1428

## 2024-07-06 NOTE — Progress Notes (Signed)
 PHARMACY - ANTICOAGULATION CONSULT NOTE  Pharmacy Consult for IV bivalirudin Indication: atrial fibrillation  Allergies  Allergen Reactions   Ace Inhibitors Swelling   Diphenhydramine Other (See Comments)    Hyperactive/jittery    Doxycycline Hyclate Nausea And Vomiting   Codeine Nausea And Vomiting    Patient Measurements: Height: 5' 1 (154.9 cm) Weight: 70.4 kg (155 lb 3.3 oz) IBW/kg (Calculated) : 47.8 HEPARIN DW (KG): 63.1  Vital Signs: Temp: 99 F (37.2 C) (11/01 0729) Temp Source: Axillary (11/01 0729) BP: 130/68 (11/01 0803) Pulse Rate: 98 (11/01 0803)  Labs: Recent Labs    07/03/24 1328 07/03/24 2240 07/04/24 0442 07/04/24 2029 07/05/24 0405 07/05/24 2218 07/06/24 0515  HGB  --    < > 8.3*  --  8.4*  --  9.0*  HCT  --   --  27.4*  --  27.6*  --  28.8*  PLT  --   --  374  --  395  --  487*  APTT  --    < > 50*   < > 52* 57* 56*  HEPARINUNFRC 0.32  --   --   --   --   --   --   CREATININE  --   --  0.93  --  1.11*  --  1.18*   < > = values in this interval not displayed.    Estimated Creatinine Clearance: 36.9 mL/min (A) (by C-G formula based on SCr of 1.18 mg/dL (H)).  Assessment: 78 yo F presenting with pneumonia requiring intubation. Pt now with new afib RVR; no AC PTA. Pharmacy consulted to dose bivalirudin. Patient was previously on heparin with labile heparin levels/atypical dosing range.  APTT 56, remains therapeutic on 0.15 mg/kg/hr.  Hgb 9, Plts up  Goal of Therapy:  aPTT 50-85 seconds Monitor platelets by anticoagulation protocol: Yes   Plan:  Continue bivalirudin 0.15 mg/kg/hr  Monitor daily aPTT, CBC, signs/symptoms of bleeding   Harlene Boga, PharmD, BCPS, BCCCP Clinical Pharmacist Please check AMION for all [facility] Pharmacy phone numbers After 10:00 PM, call Main Pharmacy [phone]

## 2024-07-06 NOTE — Progress Notes (Signed)
{  [facility] Physician-Brief Progress Note Patient Name: Katie Waller DOB: [birth year] MRN: [id number]   Date of Service  07/06/2024  HPI/Events of Note  Pt with continued leg pain.  Prior order for PRN fentanyl  ad expired.   eICU Interventions  Reordered fentanyl  25mcg IV PRN.         [name] 07/06/2024, 11:28 PM  2:20 AM Pt with ventilator dyssynchrony, getting TV of , peak pressures >40 With some resistance when bagging pt.  One time dose of versed given, mini lavage performed.  RT was able to suction out mucus plugs.  Pt placed back on vent, airway pressures had come back down to prior levels.  CXR done,showed ETT in place, no new pneumothorax/infiltrate noted.  Will continue to monitor closely.

## 2024-07-06 NOTE — Progress Notes (Signed)
 RT note. SBT attempted at this time, patient placed back on PRVC due to low mve/vt. Sputum sample in place, attempt to suction and not enough secretions to send down. RN aware. RT will continue to monitor.     07/06/24 0835  Vent Select  Invasive or Noninvasive Invasive  Adult Vent Y  Airway 7.5 mm  Placement Date/Time: 06/29/24 1228   Placed By: (c) ICU physician  Airway Device: Endotracheal Tube  Laryngoscope Blade: (c) 3  ETT Types: Oral  Size (mm): 7.5 mm  Cuffed: Cuffed  Insertion attempts: 1  Airway Equipment: Video Laryngoscope  Placement ...  Secured at (cm) 25 cm  Measured From Lips  Secured Location Left  Secured By English As A Second Language Teacher No  Tube Holder Repositioned Yes  Prone position No  Cuff Pressure (cm H2O) Clear OR 27-39 CmH2O  Site Condition Dry  Adult Ventilator Settings  Vent Type Servo i  Humidity HME  Vent Mode PRVC  Vt Set 380 mL  Set Rate 15 bmp  FiO2 (%) 40 %  I Time 0.8 Sec(s)  PEEP 5 cmH20  Adult Ventilator Measurements  Peak Airway Pressure 31 L/min  Mean Airway Pressure 10 cmH20  Plateau Pressure 22 cmH20  Resp Rate Spontaneous 0 br/min  Resp Rate Total 15 br/min  Exhaled Vt 390 mL  Measured Ve 5.6 L  I:E Ratio Measured 1:4.5  Auto PEEP 0 cmH20  Total PEEP 5 cmH20  SpO2 99 %  Adult Ventilator Alarms  Alarms On Y  Ve High Alarm 21 L/min  Ve Low Alarm 4 L/min  Resp Rate High Alarm 40 br/min  Resp Rate Low Alarm 10  PEEP Low Alarm 3 cmH2O  Press High Alarm 40 cmH2O  T Apnea 20 sec(s)  VAP Prevention  HOB> 30 Degrees Y  Equipment wiped down Yes  Daily Weaning Assessment  Daily Assessment of Readiness to Wean Wean protocol criteria met (SBT performed)  SBT Method CPAP 5 cm H20 and PS 5 cm H20  Weaning Start Time [phone]  Patient response Failed SBT terminated  Reason SBT Terminated (S)   (low mve/ VT)

## 2024-07-06 NOTE — Plan of Care (Signed)
  Problem: Health Behavior/Discharge Planning: Goal: Ability to manage health-related needs will improve Outcome: Progressing   Problem: Clinical Measurements: Goal: Ability to maintain clinical measurements within normal limits will improve Outcome: Progressing Goal: Will remain free from infection Outcome: Progressing Goal: Diagnostic test results will improve Outcome: Progressing Goal: Respiratory complications will improve Outcome: Progressing Goal: Cardiovascular complication will be avoided Outcome: Progressing   Problem: Activity: Goal: Risk for activity intolerance will decrease Outcome: Progressing   Problem: Nutrition: Goal: Adequate nutrition will be maintained Outcome: Progressing   Problem: Coping: Goal: Level of anxiety will decrease Outcome: Progressing   Problem: Elimination: Goal: Will not experience complications related to bowel motility Outcome: Progressing Goal: Will not experience complications related to urinary retention Outcome: Progressing   Problem: Pain Managment: Goal: General experience of comfort will improve and/or be controlled Outcome: Progressing   Problem: Safety: Goal: Ability to remain free from injury will improve Outcome: Progressing   Problem: Skin Integrity: Goal: Risk for impaired skin integrity will decrease Outcome: Progressing   Problem: Education: Goal: Ability to describe self-care measures that may prevent or decrease complications (Diabetes Survival Skills Education) will improve Outcome: Progressing Goal: Individualized Educational Video(s) Outcome: Progressing   Problem: Coping: Goal: Ability to adjust to condition or change in health will improve Outcome: Progressing   Problem: Fluid Volume: Goal: Ability to maintain a balanced intake and output will improve Outcome: Progressing   Problem: Health Behavior/Discharge Planning: Goal: Ability to identify and utilize available resources and services will  improve Outcome: Progressing Goal: Ability to manage health-related needs will improve Outcome: Progressing   Problem: Metabolic: Goal: Ability to maintain appropriate glucose levels will improve Outcome: Progressing   Problem: Nutritional: Goal: Maintenance of adequate nutrition will improve Outcome: Progressing Goal: Progress toward achieving an optimal weight will improve Outcome: Progressing   Problem: Skin Integrity: Goal: Risk for impaired skin integrity will decrease Outcome: Progressing   Problem: Tissue Perfusion: Goal: Adequacy of tissue perfusion will improve Outcome: Progressing   Problem: Fluid Volume: Goal: Hemodynamic stability will improve Outcome: Progressing   Problem: Clinical Measurements: Goal: Diagnostic test results will improve Outcome: Progressing Goal: Signs and symptoms of infection will decrease Outcome: Progressing   Problem: Respiratory: Goal: Ability to maintain adequate ventilation will improve Outcome: Progressing

## 2024-07-06 DEATH — deceased

## 2024-07-07 ENCOUNTER — Inpatient Hospital Stay (HOSPITAL_COMMUNITY)

## 2024-07-07 DIAGNOSIS — J9621 Acute and chronic respiratory failure with hypoxia: Secondary | ICD-10-CM | POA: Diagnosis not present

## 2024-07-07 DIAGNOSIS — J441 Chronic obstructive pulmonary disease with (acute) exacerbation: Secondary | ICD-10-CM | POA: Diagnosis not present

## 2024-07-07 DIAGNOSIS — J123 Human metapneumovirus pneumonia: Secondary | ICD-10-CM | POA: Diagnosis not present

## 2024-07-07 DIAGNOSIS — J90 Pleural effusion, not elsewhere classified: Secondary | ICD-10-CM | POA: Diagnosis not present

## 2024-07-07 LAB — CBC
HCT: 30.8 % — ABNORMAL LOW (ref 36.0–46.0)
Hemoglobin: 9.3 g/dL — ABNORMAL LOW (ref 12.0–15.0)
MCH: 28.9 pg (ref 26.0–34.0)
MCHC: 30.2 g/dL (ref 30.0–36.0)
MCV: 95.7 fL (ref 80.0–100.0)
Platelets: 434 K/uL — ABNORMAL HIGH (ref 150–400)
RBC: 3.22 MIL/uL — ABNORMAL LOW (ref 3.87–5.11)
RDW: 13.9 % (ref 11.5–15.5)
WBC: 15.9 K/uL — ABNORMAL HIGH (ref 4.0–10.5)
nRBC: 0 % (ref 0.0–0.2)

## 2024-07-07 LAB — APTT: aPTT: 57 s — ABNORMAL HIGH (ref 24–36)

## 2024-07-07 LAB — GLUCOSE, CAPILLARY
Glucose-Capillary: 104 mg/dL — ABNORMAL HIGH (ref 70–99)
Glucose-Capillary: 106 mg/dL — ABNORMAL HIGH (ref 70–99)
Glucose-Capillary: 110 mg/dL — ABNORMAL HIGH (ref 70–99)
Glucose-Capillary: 85 mg/dL (ref 70–99)
Glucose-Capillary: 96 mg/dL (ref 70–99)
Glucose-Capillary: 99 mg/dL (ref 70–99)

## 2024-07-07 LAB — BASIC METABOLIC PANEL WITH GFR
Anion gap: 18 — ABNORMAL HIGH (ref 5–15)
BUN: 34 mg/dL — ABNORMAL HIGH (ref 8–23)
CO2: 29 mmol/L (ref 22–32)
Calcium: 9.5 mg/dL (ref 8.9–10.3)
Chloride: 94 mmol/L — ABNORMAL LOW (ref 98–111)
Creatinine, Ser: 0.94 mg/dL (ref 0.44–1.00)
GFR, Estimated: >60 mL/min (ref >60)
Glucose, Bld: 103 mg/dL — ABNORMAL HIGH (ref 70–99)
Potassium: 3.4 mmol/L — ABNORMAL LOW (ref 3.5–5.1)
Sodium: 141 mmol/L (ref 135–145)

## 2024-07-07 MED ORDER — MIDAZOLAM HCL (PF) 2 MG/2ML IJ SOLN: 2.0000 mg | Freq: Once | INTRAMUSCULAR | Status: AC

## 2024-07-07 MED ORDER — ORAL CARE MOUTH RINSE: 15.0000 mL | OROMUCOSAL | Status: DC | PRN

## 2024-07-07 MED ORDER — GABAPENTIN 300 MG PO CAPS
600.0000 mg | ORAL_CAPSULE | Freq: Every evening | ORAL | Status: DC | PRN
  Administered 2024-07-09 – 2024-07-10 (×2): 600 mg via ORAL
  Filled 2024-07-07 (×4): qty 2

## 2024-07-07 MED ORDER — ORAL CARE MOUTH RINSE
15.0000 mL | OROMUCOSAL | Status: DC
Start: 2024-07-07 — End: 2024-07-07
  Administered 2024-07-07: 15 mL via OROMUCOSAL

## 2024-07-07 MED ORDER — ACETAMINOPHEN 325 MG PO TABS
650.0000 mg | ORAL_TABLET | Freq: Four times a day (QID) | ORAL | Status: DC | PRN
  Administered 2024-07-07 – 2024-07-10 (×6): 650 mg via ORAL
  Filled 2024-07-07 (×6): qty 2

## 2024-07-07 MED ORDER — MIDAZOLAM HCL 2 MG/2ML IJ SOLN
INTRAMUSCULAR | Status: AC
  Administered 2024-07-07: 2 mg
  Filled 2024-07-07: qty 2

## 2024-07-07 MED ORDER — PROPOFOL 1000 MG/100ML IV EMUL: 0.0000 ug/kg/min | INTRAVENOUS | Status: DC

## 2024-07-07 NOTE — Progress Notes (Signed)
 RT note. SBT done at this time.   07/07/24 1129  Vent Select  Invasive or Noninvasive Invasive  Adult Vent Y  Airway 7.5 mm  Placement Date/Time: 06/29/24 1228   Placed By: (c) ICU physician  Airway Device: Endotracheal Tube  Laryngoscope Blade: (c) 3  ETT Types: Oral  Size (mm): 7.5 mm  Cuffed: Cuffed  Insertion attempts: 1  Airway Equipment: Video Laryngoscope  Placement ...  Secured at (cm) 24 cm  Measured From Lips  Secured Location Right  Secured By English As A Second Language Teacher No  Tube Holder Repositioned Yes  Prone position No  Cuff Pressure (cm H2O) Clear OR 27-39 CmH2O  Site Condition Dry  Adult Ventilator Settings  Vent Type Servo i  Humidity HME  Vent Mode (S)  PSV;CPAP  FiO2 (%) 40 %  Pressure Support 8 cmH20  PEEP 5 cmH20  Adult Ventilator Measurements  Peak Airway Pressure 13 L/min  Mean Airway Pressure 8 cmH20  Resp Rate Spontaneous 21 br/min  Resp Rate Total 21 br/min  Spont TV 315 mL  Measured Ve 6.4 L  Auto PEEP 0 cmH20  Total PEEP 5 cmH20  SpO2 96 %  Adult Ventilator Alarms  Alarms On Y

## 2024-07-07 NOTE — Progress Notes (Signed)
 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 Code stroke called. On arrival to ED patient emergently intubated for acute respiratory distress and airway protection. CT Head with no ICH or large infarct, CTA head and neck with no LVO. CXR concerning for a Pneumonia with visible BL lung apices on CTA demonstrating infection/inflammation. Code stroke cancelled with presentation suspicious for sepsis. Patient received 30ml/kg crystalloid, pan cultured, and started empirically on Cefepime/Vanc/Azithromycin . Of note, family reports patient has recently not felt well and likely has had a cold. PCCM consulted for ICU admission.   On exam in ED, patient febrile to >101 and hypotensive, likely multifactorial in setting of presumed sepsis and recent sedation on induction with underlying heart disease. Norepinephrine gtt was initiated for MAPs < 65 and patient was transferred to ICU with plans to gain central access for vasopressor administration and arterial line placement for hemodynamic monitoring.  Pertinent  Medical History  Per above   Significant Hospital Events: Including procedures, antibiotic start and stop dates in addition to other pertinent events   Found unresponsive at home->ED>Code stroke>intubated for ARD/airway protection>CTH/CTA negative>Code stroke cancelled>Hypotensive>started on pressors>Admit to ICU  Arterial line placed 10/21 10/22 and 10/23 unsuccessful extubation 10/24: Despite maximal Precedex and fentanyl   pushes, unable to safely and comfortably clean the patient up so added prn Versed and started fentanyl  infusion. Rectal tube added for diarrhea.  10/24: 2/3 BC resulted in staph capitis/epidermitis, extubated on BiPAP 10/25 patient remained on BiPAP for 24 hours, when she came off of BiPAP, could not tolerate became tachypneic, tachycardic went into respiratory distress requiring endotracheal reintubation 10/29 unsuccessful SBT, went briefly back into RVR 10/30 unsuccessful weaning from sedation as BP/HR increased considerably  Interim History / Subjective:  She denies complaints today. This morning she was lethargic due to a rough  night last night- mucus plugging overnight.    Objective    Blood pressure (!) 141/69, pulse 92, temperature 98.9 F (37.2 C), temperature source Axillary, resp. rate 19, height 5' 1 (1.549 m), weight 69.3 kg, SpO2 96%.    Vent Mode: PSV;CPAP FiO2 (%):  [40 %-50 %] 40 % Set Rate:  [15 bmp] 15 bmp Vt Set:  [380 mL] 380 mL PEEP:  [5 cmH20] 5 cmH20 Pressure Support:  [8 cmH20] 8 cmH20 Plateau Pressure:  [13 cmH20-20 cmH20] 20 cmH20   Intake/Output Summary (Last 24 hours) at 07/07/2024 1345 Last data filed at 07/07/2024 0800 Gross per 24 hour  Intake 1079.22 ml  Output 2250 ml  Net -1170.78 ml   Filed Weights   07/05/24 0500 07/06/24 0500 07/07/24 0500  Weight: 73.9 kg 70.4 kg 69.3 kg    Examination: General: critically ill appearing woman lying in bed in NAD HEENT:  [place]/AT, eyes anicteric Lungs: breathing comfortably on PS 8, no wheezing or rhonchi Cardiovascular: S1S2, RRR Abd soft, NT Extremities: improved edema Neuro: RASS -1, able to nod to answer questions, able to double tidal volume on vent on command.  K+ 3.4 BUN 34 Cr 0.94 WBC 15.9 H/H 9.3/30.8 Platelets 434 Resp  culture > rare yeast CXR personally reviewed> ETT about 1.5 cm from carina. Likely lateral LLL infiltrate.   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 Does not tolerate BiPAP.  -con't triple nebs -Cefepime & vanc; waiting on cultures. Fevers and WBC improving. -con't steroid taper  Sepsis due to bilateral multifocal pneumonia with metapneumovirus and probable superimposed bacterial pneumonia -follow reps culture -cefepime, vanc -foley & CVC out  Afib with RVR, new onset, paroxysmal Chronic HFpEF -con't IV amidoarone; if she remains extubated can hopefully switch this to oral -con't bival -tele monitoring -can restart metoprolol  once ileus improves  Anemia- stable -transfuse for Hb <7 or hemodynamically significant bleeding  Ileus; previously had diarrhea; may be from fiber given to treat diarrhea -d/c OGT with extubation; hold off on NGT since output from tube has slowed overnight and into today -cautious progress diet once she is stable post-extubation; would only do clears to start with  Obesity -long term recommend modest weight loss  Hyperglycemia--resolved off TF and with lower doses of steroids -SSI PRN   AKI, resolved -strict I/O -monitor, may need additional diuresis   Daughter updated at bedside this morning. Family is aware that if she fails extubation again, tracheostomy is recommended if they wish to continue aggressive care measures.   Labs   CBC: Recent Labs  Lab 07/03/24 0551 07/04/24 0442 07/05/24 0405 07/06/24 0515 07/07/24 1003  WBC 12.4* 11.8* 13.4* 17.2* 15.9*  HGB 8.6* 8.3* 8.4* 9.0* 9.3*  HCT 28.0* 27.4* 27.6* 28.8* 30.8*  MCV 94.6 95.8 94.5 92.6 95.7  PLT 379 374 395 487* 434*    Basic Metabolic Panel: Recent Labs  Lab 07/02/24 0434  07/03/24 0551 07/04/24 0442 07/05/24 0405 07/06/24 0515 07/07/24 1003  NA 142 143 142 140 139 141  K 4.6 4.4 4.8 4.5 3.5 3.4*  CL 104 97* 93* 86* 87* 94*  CO2 30 33* 37* 40* 37* 29  GLUCOSE 166* 239* 169* 145* 94 103*  BUN 44* 44* 45* 45* 41* 34*  CREATININE 1.10* 1.09* 0.93 1.11* 1.18* 0.94  CALCIUM  8.5* 9.2 9.1 9.7 9.9 9.5  MG 2.4  --   --   --  2.1  --    GFR: Estimated Creatinine Clearance: 46 mL/min (by C-G formula based on SCr of 0.94 mg/dL). Recent Labs  Lab 07/01/24 0249 07/01/24 1202 07/04/24 0442 07/05/24 0405 07/06/24 0515 07/07/24 1003  PROCALCITON 1.61  --   --   --   --   --   WBC  --    < > 11.8* 13.4* 17.2* 15.9*   < > = values in this interval not displayed.   This patient is critically ill with multiple organ system failure which requires frequent high complexity decision making, assessment, support, evaluation, and titration of therapies. This was completed through the application of advanced monitoring technologies and extensive interpretation of multiple databases. During this encounter critical care time was devoted to patient care services described in this note for 33 minutes.  Leita SHAUNNA Gaskins, DO 07/07/24 1:58 PM [facility]  For contact information, see Amion. If no response to pager, please call PCCM consult pager. After hours, 7PM- 7AM, please call Elink.

## 2024-07-07 NOTE — Progress Notes (Signed)
 Lab called due to Chart Review --> Labs showing needs to be collected.  Phlebotomist on floor earlier this morning notified that pt was now a lab draw.  Was not notified if phlebotomist was unable to obtain blood.  Lab tech sending someone to draw labs.

## 2024-07-07 NOTE — Progress Notes (Signed)
 Extubation Procedure Note  Patient Details:   Name: Katie Waller DOB: Aug 14, 1946 MRN: [id number]   Airway Documentation:    Vent end date: 06/28/24 Vent end time: 1125   Evaluation  O2 sats: stable throughout Complications: No apparent complications Patient did tolerate procedure well. Bilateral Breath Sounds: (P) Diminished   Yes Patient extubated at this time per Md order. Patient placed on HHFNC 30L- 45% sat 96% with no labored breathing noted. Patient stable throughout extubation, patient with cuff leak, patient able to say name, no stridor noted. RT will continue to monitor.  Maryelizabeth DELENA Sprang 07/07/2024, 1:54 PM

## 2024-07-07 NOTE — Progress Notes (Signed)
 Doubled volume for time change.  Additional hour of volume added to intake to count for missing 1-hour time span.

## 2024-07-07 NOTE — Progress Notes (Signed)
 PHARMACY - ANTICOAGULATION CONSULT NOTE  Pharmacy Consult for IV bivalirudin Indication: atrial fibrillation  Allergies  Allergen Reactions   Ace Inhibitors Swelling   Diphenhydramine Other (See Comments)    Hyperactive/jittery    Doxycycline Hyclate Nausea And Vomiting   Codeine Nausea And Vomiting    Patient Measurements: Height: 5' 1 (154.9 cm) Weight: 69.3 kg (152 lb 12.5 oz) IBW/kg (Calculated) : 47.8 HEPARIN DW (KG): 63.1  Vital Signs: Temp: 98.9 F (37.2 C) (11/02 1135) Temp Source: Axillary (11/02 1135) BP: 141/69 (11/02 0900) Pulse Rate: 92 (11/02 1135)  Labs: Recent Labs    07/05/24 0405 07/05/24 2218 07/06/24 0515 07/07/24 1003  HGB 8.4*  --  9.0* 9.3*  HCT 27.6*  --  28.8* 30.8*  PLT 395  --  487* 434*  APTT 52* 57* 56* 57*  CREATININE 1.11*  --  1.18* 0.94    Estimated Creatinine Clearance: 46 mL/min (by C-G formula based on SCr of 0.94 mg/dL).  Assessment: 78 yo F presenting with pneumonia requiring intubation. Pt now with new afib RVR; no AC PTA. Pharmacy consulted to dose bivalirudin. Patient was previously on heparin with labile heparin levels/atypical dosing range.  APTT 57, remains therapeutic on 0.15 mg/kg/hr.  Hgb 9.3, Plts up 434  Goal of Therapy:  aPTT 50-85 seconds Monitor platelets by anticoagulation protocol: Yes   Plan:  Continue bivalirudin 0.15 mg/kg/hr  Monitor daily aPTT, CBC, signs/symptoms of bleeding   Harlene Boga, PharmD, BCPS, BCCCP Clinical Pharmacist Please check AMION for all [facility] phone numbers After 10:00 PM, call Main Pharmacy [phone]

## 2024-07-07 NOTE — Progress Notes (Signed)
 Pt was setting off the ventilator alarms, upon entering it was noted that the pt looked uncomfortable. On her ventilator it showed she was not getting her Vts, and her peak pressures were in the 40's. Pt was given a couple of O2 breaths and was suctioned without any improvements. Pt began to desat into the 80's. She was taken off the ventilator and bagged. Eventually pt was was lavaged, and suctioned while being bagged. Chunks of mucus plugs were suctioned out and pt was placed back on the ventilator. Oxygen  saturations returned back to 100%, peak pressures reduced down to 25 and pt was getting Vts up to 400.  RN was also present and gave medicine to help get the pt more comfortable.  As of now the pt vitals look stable and the pt seems more at ease. RT will monitor as needed.

## 2024-07-08 LAB — LIPASE, BLOOD: Lipase: 165 U/L — ABNORMAL HIGH (ref 11–51)

## 2024-07-08 LAB — BASIC METABOLIC PANEL WITH GFR
Anion gap: 12 (ref 5–15)
BUN: 28 mg/dL — ABNORMAL HIGH (ref 8–23)
CO2: 32 mmol/L (ref 22–32)
Calcium: 9.2 mg/dL (ref 8.9–10.3)
Chloride: 99 mmol/L (ref 98–111)
Creatinine, Ser: 0.79 mg/dL (ref 0.44–1.00)
GFR, Estimated: >60 mL/min (ref >60)
Glucose, Bld: 91 mg/dL (ref 70–99)
Potassium: 3 mmol/L — ABNORMAL LOW (ref 3.5–5.1)
Sodium: 143 mmol/L (ref 135–145)

## 2024-07-08 LAB — CBC
HCT: 27.9 % — ABNORMAL LOW (ref 36.0–46.0)
Hemoglobin: 8.4 g/dL — ABNORMAL LOW (ref 12.0–15.0)
MCH: 29.3 pg (ref 26.0–34.0)
MCHC: 30.1 g/dL (ref 30.0–36.0)
MCV: 97.2 fL (ref 80.0–100.0)
Platelets: 396 K/uL (ref 150–400)
RBC: 2.87 MIL/uL — ABNORMAL LOW (ref 3.87–5.11)
RDW: 14 % (ref 11.5–15.5)
WBC: 12.6 K/uL — ABNORMAL HIGH (ref 4.0–10.5)
nRBC: 0 % (ref 0.0–0.2)

## 2024-07-08 LAB — HEPATIC FUNCTION PANEL
ALT: 50 U/L — ABNORMAL HIGH (ref 0–44)
AST: 46 U/L — ABNORMAL HIGH (ref 15–41)
Albumin: 2.3 g/dL — ABNORMAL LOW (ref 3.5–5.0)
Alkaline Phosphatase: 42 U/L (ref 38–126)
Bilirubin, Direct: <0.1 mg/dL (ref 0.0–0.2)
Total Bilirubin: 0.7 mg/dL (ref 0.0–1.2)
Total Protein: 5.9 g/dL — ABNORMAL LOW (ref 6.5–8.1)

## 2024-07-08 LAB — CULTURE, RESPIRATORY W GRAM STAIN: Gram Stain: NONE SEEN

## 2024-07-08 LAB — GLUCOSE, CAPILLARY
Glucose-Capillary: 103 mg/dL — ABNORMAL HIGH (ref 70–99)
Glucose-Capillary: 104 mg/dL — ABNORMAL HIGH (ref 70–99)
Glucose-Capillary: 106 mg/dL — ABNORMAL HIGH (ref 70–99)
Glucose-Capillary: 82 mg/dL (ref 70–99)
Glucose-Capillary: 83 mg/dL (ref 70–99)
Glucose-Capillary: 86 mg/dL (ref 70–99)

## 2024-07-08 LAB — MAGNESIUM: Magnesium: 2.3 mg/dL (ref 1.7–2.4)

## 2024-07-08 LAB — TROPONIN I (HIGH SENSITIVITY)
Troponin I (High Sensitivity): 268 ng/L (ref <18)
Troponin I (High Sensitivity): 209 ng/L (ref <18)

## 2024-07-08 LAB — TRIGLYCERIDES: Triglycerides: 134 mg/dL (ref <150)

## 2024-07-08 LAB — APTT: aPTT: 73 s — ABNORMAL HIGH (ref 24–36)

## 2024-07-08 MED ORDER — VANCOMYCIN HCL 750 MG/150ML IV SOLN
750.0000 mg | INTRAVENOUS | Status: DC
  Administered 2024-07-08: 750 mg via INTRAVENOUS
  Filled 2024-07-08: qty 150

## 2024-07-08 MED ORDER — POTASSIUM CHLORIDE 10 MEQ/100ML IV SOLN
10.0000 meq | INTRAVENOUS | Status: AC
  Administered 2024-07-08 (×4): 10 meq via INTRAVENOUS
  Filled 2024-07-08 (×4): qty 100

## 2024-07-08 MED ORDER — LOSARTAN POTASSIUM 50 MG PO TABS
50.0000 mg | ORAL_TABLET | Freq: Every day | ORAL | Status: DC
Start: 2024-07-08 — End: 2024-07-10
  Administered 2024-07-08 – 2024-07-09 (×2): 50 mg via ORAL
  Filled 2024-07-08 (×3): qty 1

## 2024-07-08 NOTE — Progress Notes (Signed)
 Nutrition Follow-up  DOCUMENTATION CODES:   Not applicable  INTERVENTION:  Monitor for diet advancement  If kept on CLD or kept NPO in the next 24-72 hours recommend cortrak placement and tube feeding regimen of: Mallie Pinion Standard 1.4 at 45 ml/h (1080 ml per day) Prosource TF20 60 ml 1x/d Provides 1592 kcal, 87 gm protein, 778 ml free water daily  CLD-Boost Breeze po TID, each supplement provides 250 kcal and 9 grams of protein FLD-Ensure Plus High Protein po BID, each supplement provides 350 kcal and 20 grams of protein, Magic cup TID with meals, each supplement provides 290 kcal and 9 grams of protein MVI with minerals daily   NUTRITION DIAGNOSIS:   Inadequate oral intake related to acute illness as evidenced by NPO status. - Progressing   GOAL:   Patient will meet greater than or equal to 90% of their needs - Progressing   MONITOR:   Vent status, Labs, Weight trends, TF tolerance  REASON FOR ASSESSMENT:   Ventilator    ASSESSMENT:   Pt admitted with AMS, sepsis, ARF. 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  10/21 admitted d/t PNA, HMPV infection; intubated; stroke/sz ruled out 10/23 failed vent wean; initiate tube feeds 10/24 - extubated 10/25 - reintubated 11/2 - Extubated, tube feeds D/C, no access  Pt extubated yesterday afternoon, has needed 30L of HHFNC since extubation. Per OT, pt needs maximal assistance for ADL. Was very weak and tired today, did not respond to questions. Per daughter pt had a very good appetite before admission. UBW 145 lbs. Intentionally lost 5-10 lbs over the year.   Pt has been on enteral nutrition since 10/23, meeting 100% of pt's needs, these were discontinued yesterday as access was pulled with pt being extubated. Per RN, plan for bedside swallow today. If pt passes she will be on clears and diet will be advanced as tolerated. If  pt fails or is on CLD for longer than 48-72 hours, recommend placement of cortrak and reinitiating tube feeds.  BM have improved, FMS removed. Now having x1 BM /day, still type 7.  If tube feeds resumed, may not need extra fiber from Banatrol.  Ongoing GOC as pt has had trouble weaning from the vent, if pt were to get intubated again, tracheostomy is recommended if they wish to continue aggressive care measures.  Temp (24hrs), Avg:98.1 F (36.7 C), Min:97.4 F (36.3 C), Max:99 F (37.2 C) MAP (cuff): 100 mmHg  Admit weight: 71 kg  Current weight: 66.8 kg UBW: 145 lbs   Intake/Output Summary (Last 24 hours) at 07/08/2024 1421 Last data filed at 07/08/2024 0700 Gross per 24 hour  Intake 1246.03 ml  Output 800 ml  Net 446.03 ml   Net IO Since Admission: 1,038.96 mL [07/08/24 1421]  Drains/Lines: Catheter - 800 ml x 24 hours   Average Meal Intake: NPO  Nutritionally Relevant Medications: Scheduled Meds:  feeding supplement (PROSource TF20)  60 mL Per Tube Daily   insulin aspart  0-9 Units Subcutaneous Q4H   metoCLOPramide  (REGLAN ) injection  5 mg Intravenous Q8H   Continuous Infusions:  amiodarone 30 mg/hr (07/08/24 0700)   bivalirudin (ANGIOMAX) 250 mg in sodium chloride  0.9 % 500 mL (0.5 mg/mL) infusion 0.15 mg/kg/hr (07/08/24 0700)   ceFEPime (MAXIPIME) IV 2 g (07/08/24 0934)   feeding supplement (KATE FARMS STANDARD ENT 1.4) Stopped (07/05/24 1700)   vancomycin 750 mg (07/08/24 1234)   Labs Reviewed:  Potassium 3.0  BUN 28 AST 46 ALT 50 CBG ranges from 82-86 mg/dL over the last 24 hours HgbA1c 5.6  NUTRITION - FOCUSED PHYSICAL EXAM:  Flowsheet Row Most Recent Value  Orbital Region No depletion  Upper Arm Region No depletion  [L arm edema]  Thoracic and Lumbar Region No depletion  Buccal Region No depletion  Temple Region No depletion  Clavicle Bone Region No depletion  Clavicle and Acromion Bone Region No depletion  Scapular Bone Region No depletion   Dorsal Hand No depletion  Patellar Region No depletion  Anterior Thigh Region No depletion  Posterior Calf Region No depletion  Edema (RD Assessment) Mild  [L arm]  Hair Reviewed  Eyes Reviewed  Mouth Unable to assess  Skin Reviewed  Nails Reviewed    Diet Order:   Diet Order             Diet NPO time specified Except for: Sips with Meds  Diet effective now                   EDUCATION NEEDS:   No education needs have been identified at this time  Skin:  Skin Assessment: Reviewed RN Assessment  Last BM:  11/2 - Type 7 x 1  Height:   Ht Readings from Last 1 Encounters:  06/30/24 5' 1 (1.549 m)    Weight:   Wt Readings from Last 1 Encounters:  07/08/24 66.8 kg    Ideal Body Weight:  47.7 kg  BMI:  Body mass index is 27.83 kg/m.  Estimated Nutritional Needs:   Kcal:  1500-1700 kcal/d  Protein:  75-90 g/d  Fluid:  >/=1.5L   Olivia Kenning, RD Registered Dietitian  See Amion for more information

## 2024-07-08 NOTE — Progress Notes (Signed)
 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 Code stroke called. On arrival to ED patient emergently intubated for acute respiratory distress and airway protection. CT Head with no ICH or large infarct, CTA head and neck with no LVO. CXR concerning for a Pneumonia with visible BL lung apices on CTA demonstrating infection/inflammation. Code stroke cancelled with presentation suspicious for sepsis. Patient received 30ml/kg crystalloid, pan cultured, and started empirically on Cefepime/Vanc/Azithromycin . Of note, family reports patient has recently not felt well and likely has had a cold. PCCM consulted for ICU admission.   On exam in ED, patient febrile to >101 and hypotensive, likely multifactorial in setting of presumed sepsis and recent sedation on induction with underlying heart disease. Norepinephrine gtt was initiated for MAPs < 65 and patient was transferred to ICU with plans to gain central access for vasopressor administration and arterial line placement for hemodynamic monitoring.  Pertinent  Medical History  Per above   Significant Hospital Events: Including procedures, antibiotic start and stop dates in addition to other pertinent events   Found unresponsive at home->ED>Code stroke>intubated for ARD/airway protection>CTH/CTA negative>Code stroke cancelled>Hypotensive>started on pressors>Admit to ICU  Arterial line placed 10/21 10/22 and 10/23 unsuccessful extubation 10/24: Despite maximal Precedex and fentanyl   pushes, unable to safely and comfortably clean the patient up so added prn Versed and started fentanyl  infusion. Rectal tube added for diarrhea.  10/24: 2/3 BC resulted in staph capitis/epidermitis, extubated on BiPAP 10/25 patient remained on BiPAP for 24 hours, when she came off of BiPAP, could not tolerate became tachypneic, tachycardic went into respiratory distress requiring endotracheal reintubation 10/29 unsuccessful SBT, went briefly back into RVR 10/30 unsuccessful weaning from sedation as BP/HR increased considerably   Interim History / Subjective:  Having BM's. Tolerating HFNC well.   Objective    Blood pressure (!) 168/58, pulse 82, temperature 98.4 F (36.9 C), temperature source Axillary, resp. rate (!) 26, height 5' 1 (1.549 m), weight 66.8 kg, SpO2 100%.    Vent Mode: PSV;CPAP FiO2 (%):  [40 %-45 %] 45 % PEEP:  [5 cmH20] 5 cmH20 Pressure Support:  [8 cmH20] 8 cmH20   Intake/Output Summary (Last 24 hours) at 07/08/2024 0951 Last data filed at 07/08/2024 0700 Gross per 24 hour  Intake 1246.03 ml  Output 800 ml  Net 446.03 ml   Filed Weights   07/06/24 0500 07/07/24 0500 07/08/24 0500  Weight: 70.4 kg 69.3 kg 66.8 kg    Examination: General: critically ill appearing woman lying in bed in NAD HEENT:  South /AT, eyes anicteric Lungs: no wheezing, decent air movement Cardiovascular: S1S2, RRR Abd soft, NT Extremities: improved edema Neuro: alert and oriented  Resolved problem list   Assessment and Plan  Acute on chronic hypoxic & hypercapnic respiratory failure Acute COPD exacerbation Human metapneumovirus pneumonia probably superimposed bacterial PNA 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  Afebrile, Labs: WBC 15.9 > 12.6 -con't triple nebs -Tracheal aspirate grew few candida albicans, likely contaminant,  D/C'd Cefepime and Vanc today -completed steroid  taper   Afib with RVR, new onset, paroxysmal Chronic  HFpEF HTN NSR for the past few days. Will transition off Amio and Lopressor  to see how rates do. Start home Losartan  -con't bival -tele monitoring   Anemia- stable -transfuse for Hb <7 or hemodynamically significant bleeding   Ileus, resolved Nausea Passed bedside swallow. Ordered clear liquid diet and will ADAT. Continue Reglan . Lipase is 165. Troponin is 268. Denies chest pain. Trend.   Obesity -long term recommend modest weight loss   Hyperglycemia -SSI PRN, may require dose adjustment as diet is advanced   AKI, resolved -strict I/O -monitor, may need additional diuresis -Trend BMP   Labs   CBC: Recent Labs  Lab 07/04/24 0442 07/05/24 0405 07/06/24 0515 07/07/24 1003 07/08/24 0533  WBC 11.8* 13.4* 17.2* 15.9* 12.6*  HGB 8.3* 8.4* 9.0* 9.3* 8.4*  HCT 27.4* 27.6* 28.8* 30.8* 27.9*  MCV 95.8 94.5 92.6 95.7 97.2  PLT 374 395 487* 434* 396    Basic Metabolic Panel: Recent Labs  Lab 07/02/24 0434 07/03/24 0551 07/04/24 0442 07/05/24 0405 07/06/24 0515 07/07/24 1003 07/08/24 0533  NA 142   < > 142 140 139 141 143  K 4.6   < > 4.8 4.5 3.5 3.4* 3.0*  CL 104   < > 93* 86* 87* 94* 99  CO2 30   < > 37* 40* 37* 29 32  GLUCOSE 166*   < > 169* 145* 94 103* 91  BUN 44*   < > 45* 45* 41* 34* 28*  CREATININE 1.10*   < > 0.93 1.11* 1.18* 0.94 0.79  CALCIUM  8.5*   < > 9.1 9.7 9.9 9.5 9.2  MG 2.4  --   --   --  2.1  --   --    < > = values in this interval not displayed.   GFR: Estimated Creatinine Clearance: 53.1 mL/min (by C-G formula based on SCr of 0.79 mg/dL). Recent Labs  Lab 07/05/24 0405 07/06/24 0515 07/07/24 1003 07/08/24 0533  WBC 13.4* 17.2* 15.9* 12.6*    Liver Function Tests: No results for input(s): AST, ALT, ALKPHOS, BILITOT, PROT, ALBUMIN in the last 168 hours. No results for input(s): LIPASE, AMYLASE in the last 168 hours. No results for input(s): AMMONIA in the last 168 hours.  ABG    Component Value Date/Time   PHART  7.458 (H) 06/30/2024 0836   PCO2ART 41.9 06/30/2024 0836   PO2ART 204 (H) 06/30/2024 0836   HCO3 29.6 (H) 06/30/2024 0836   TCO2 31 06/30/2024 0836   O2SAT 100 06/30/2024 0836     Coagulation Profile: No results for input(s): INR, PROTIME in the last 168 hours.  Cardiac Enzymes: No results for input(s): CKTOTAL, CKMB, CKMBINDEX, TROPONINI in the last 168 hours.  HbA1C: Hgb A1c MFr Bld  Date/Time Value Ref Range Status  06/26/2024 04:47 AM 5.6 4.8 - 5.6 % Final    Comment:    (NOTE) Diagnosis of Diabetes The following HbA1c ranges recommended by the American Diabetes Association (ADA) may be used as an aid in the diagnosis of diabetes mellitus.  Hemoglobin             Suggested A1C NGSP%              Diagnosis  <5.7                   Non Diabetic  5.7-6.4  Pre-Diabetic  >6.4                   Diabetic  <7.0                   Glycemic control for                       adults with diabetes.    09/17/2013 01:57 PM 6.1 (H) <5.7 % Final    Comment:    (NOTE)                                                                       According to the ADA Clinical Practice Recommendations for 2011, when HbA1c is used as a screening test:  >=6.5%   Diagnostic of Diabetes Mellitus           (if abnormal result is confirmed) 5.7-6.4%   Increased risk of developing Diabetes Mellitus References:Diagnosis and Classification of Diabetes Mellitus,Diabetes Care,2011,34(Suppl 1):S62-S69 and Standards of Medical Care in         Diabetes - 2011,Diabetes Care,2011,34 (Suppl 1):S11-S61.    CBG: Recent Labs  Lab 07/07/24 1622 07/07/24 1946 07/07/24 2331 07/08/24 0359 07/08/24 0727  GLUCAP 96 106* 85 83 82    Review of Systems:     Past Medical History:  She,  has a past medical history of Abnormal finding on EKG (09/19/2013), Acute on chronic respiratory failure with hypercapnia (HCC), Acute respiratory failure (HCC) (06/08/2016), Anxiety (11/09/2020),  Arthritis, Asthma, Carotid bruit (11/09/2020), Chronic diastolic CHF (congestive heart failure) (HCC) (07/28/2017), Chronic obstructive pulmonary disease, unspecified (HCC) (11/09/2020), COPD, group D, by GOLD 2017 classification (HCC) (09/17/2013), Cough (11/09/2020), Decreased estrogen level (11/09/2020), Diabetes (HCC), Dyspnea, Edema (11/09/2020), Essential hypertension (09/18/2013), Goals of care, counseling/discussion, Hardening of the aorta (main artery of the heart) (11/09/2020), Heart disease, History of radiation therapy, Hypertension, Hypertensive heart failure (HCC) (11/09/2020), Hypoxia (11/09/2020), Insomnia (11/09/2020), Iron deficiency anemia (11/09/2020), Large liver (11/09/2020), Near syncope (09/17/2013), Osteoporosis (11/09/2020), Palliative care encounter, Prediabetes (09/19/2013), Pure hypercholesterolemia (11/09/2020), Raynaud's disease (11/09/2020), Sinus tachycardia (09/18/2013), Skin sensation disturbance (11/09/2020), Smoker (09/17/2013), 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   Surgical History:   Past Surgical History:  Procedure Laterality Date   BRONCHIAL BIOPSY  05/25/2023   Procedure: BRONCHIAL BIOPSIES;  Surgeon: Brenna Adine CROME, DO;  Location: [facility];  Service: Pulmonary;;   BRONCHIAL NEEDLE ASPIRATION BIOPSY  05/25/2023   Procedure: BRONCHIAL NEEDLE ASPIRATION BIOPSIES;  Surgeon: Brenna Adine CROME, DO;  Location: [facility];  Service: Pulmonary;;   FIDUCIAL MARKER PLACEMENT  05/25/2023   Procedure: FIDUCIAL MARKER PLACEMENT;  Surgeon: Brenna Adine CROME, DO;  Location: [facility];  Service: Pulmonary;;   KNEE SURGERY Left      Social History:   reports that she quit smoking about 6 years ago. Her smoking use included cigarettes. She started smoking about 6 years ago. She has a 55 pack-year smoking history. She has never used smokeless tobacco. She reports  that she does not drink alcohol and does not use drugs.   Family History:  Her family history includes Hyperlipidemia in her father and mother; Hypertension in her father and mother.  Allergies Allergies  Allergen Reactions   Ace Inhibitors Swelling   Diphenhydramine Other (See Comments)    Hyperactive/jittery    Doxycycline Hyclate Nausea And Vomiting   Codeine Nausea And Vomiting     Home Medications  Prior to Admission medications   Medication Sig Start Date End Date Taking? Authorizing Provider  acetaminophen  (TYLENOL ) 500 MG tablet Take 1,000 mg by mouth every 6 (six) hours as needed for moderate pain.   Yes [provider]  albuterol  (PROVENTIL  HFA;VENTOLIN  HFA) 108 (90 BASE) MCG/ACT inhaler Inhale 2 puffs into the lungs every 6 (six) hours as needed for wheezing or shortness of breath. 09/19/13  Yes Johnson, Clanford L, MD  aspirin  81 MG chewable tablet Chew 1 tablet (81 mg total) by mouth daily. 09/19/13  Yes Johnson, Clanford L, MD  atorvastatin  (LIPITOR) 10 MG tablet TAKE 1 TABLET BY MOUTH EVERY DAY 07/21/23  Yes Krasowski, Robert J, MD  benzonatate  (TESSALON ) 100 MG capsule Take 100 mg by mouth 3 (three) times daily as needed for cough.    Yes [provider]  Biotin 10 MG CAPS Take 10 mg by mouth daily.   Yes [provider]  budesonide -formoterol  (SYMBICORT ) 160-4.5 MCG/ACT inhaler Inhale 2 puffs into the lungs 2 (two) times daily. 08/23/19  Yes Mannam, Praveen, MD  calcium  carbonate (OSCAL) 1500 (600 Ca) MG TABS tablet Take 600 mg of elemental calcium  by mouth daily.   Yes [provider]  Cholecalciferol (VITAMIN D ) 50 MCG (2000 UT) tablet Take 2,000 Units by mouth daily.   Yes [provider]  diphenhydramine-acetaminophen  (TYLENOL  PM) 25-500 MG TABS tablet Take 1 tablet by mouth at bedtime.   Yes [provider]  furosemide  (LASIX ) 40 MG tablet Take 40 mg by mouth daily.   Yes [provider]  gabapentin  (NEURONTIN) 300 MG capsule Take 900 mg by mouth at bedtime.   Yes [provider]  iron polysaccharides (NIFEREX) 150 MG capsule Take 150 mg by mouth daily. 11/05/20  Yes [provider]  loratadine (CLARITIN) 10 MG tablet Take 10 mg by mouth daily as needed for allergies.   Yes [provider]  LORazepam  (ATIVAN ) 1 MG tablet Take 1 tablet (1 mg total) by mouth every 6 (six) hours as needed for anxiety. 08/07/17  Yes Drusilla Sabas RAMAN, MD  losartan  (COZAAR ) 50 MG tablet Take 50 mg by mouth daily.   Yes [provider]  Multiple Vitamin (MULTIVITAMIN WITH MINERALS) TABS tablet Take 1 tablet by mouth daily.   Yes [provider]  raloxifene  (EVISTA ) 60 MG tablet Take 60 mg by mouth every morning. 08/07/20  Yes [provider]  Tiotropium Bromide  Monohydrate (SPIRIVA  RESPIMAT) 2.5 MCG/ACT AERS Inhale 1 puff into the lungs daily.   Yes [provider]  traZODone  (DESYREL ) 100 MG tablet Take 1 tablet (100 mg total) by mouth at bedtime as needed for sleep. 08/07/17  Yes Drusilla Sabas RAMAN, MD     Norman Lobstein, DO

## 2024-07-08 NOTE — Evaluation (Signed)
 Physical Therapy Evaluation Patient Details Name: Katie Waller MRN: [id number] DOB: [birth year] Today's Date: 07/08/2024  History of Present Illness  78 yo female found unresponsive in home. Pt found to be hypotensive, presumed sepsis intubated 10/21 extubated 10/24, re-intubated 10/25 then extubated 07/07/24. PMH HTN COPID chronic hypoxic 5-6 L [place] baseline, HF, SCC L lower lobe s/p radiation in 05/2024.  Clinical Impression  Patient presents with decreased mobility due to decreased activity tolerance, decreased sitting balance, decreased strength and decreased cardiopulmonary endurance.  Patient previously ambulatory with rollator on her own in the home and able to shower with modified set up on her own.  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  She will benefit from skilled PT in the acute setting and will need post-acute inpatient rehab (<3 hours/day) prior to d/c home.  Could also be considered for LTACH at this point.          If plan is discharge home, recommend the following: Two people to help with bathing/dressing/bathroom;Two people to help with walking and/or transfers;Help with stairs or ramp for entrance;Assist for transportation   Can travel by private vehicle   No    Equipment Recommendations Other (comment) (TBA)  Recommendations for Other Services       Functional Status Assessment Patient has had a recent decline in their functional status and demonstrates the ability to make significant improvements in function in a reasonable and predictable amount of time.     Precautions / Restrictions Precautions Precautions: Fall Precaution/Restrictions Comments: watch 02 BP      Mobility  Bed Mobility Overal bed mobility: Needs Assistance Bed Mobility: Supine to Sit, Rolling, Sit to Supine Rolling: Mod assist   Supine to sit: Mod assist     General bed mobility comments: pt needs hob 45  degrees with pad used to help advance toward Eob. pt able to (A) with sliding BLE. Pt with support to static sit    Transfers Overall transfer level: Needs assistance Equipment used: 2 person hand held assist Transfers: Sit to/from Stand, Bed to chair/wheelchair/BSC Sit to Stand: +2 physical assistance, Max assist, From elevated surface     Squat pivot transfers: +2 physical assistance, Total assist     General transfer comment: needed knees blocked and used bed pad for lifting hips    Ambulation/Gait                  Stairs            Wheelchair Mobility     Tilt Bed    Modified Rankin (Stroke Patients Only)       Balance Overall balance assessment: Needs assistance Sitting-balance support: Feet supported Sitting balance-Leahy Scale: Poor Sitting balance - Comments: close CGA to min A with pt fatiguing while at EOB       Standing balance comment: squat pivot only did not stand                             Pertinent Vitals/Pain Pain Assessment Pain Assessment: No/denies pain    Home Living Family/patient expects to be discharged to:: Private residence   Available Help at Discharge: Family Type of Home: House Home Access: Stairs to enter   [employer] of Steps: 2 on front, on back stairs with ramp   Home Layout: One level Home Equipment: Hand held shower head;Shower seat;Grab bars - tub/shower;Rollator (4  wheels);Transport chair;Other (comment) (adjustable bed) Additional Comments: daughter works from home with in office hours on monday. son lives next door and can (A) during the times that daughter is out of home    Prior Function Prior Level of Function : Needs assist             Mobility Comments: walks with rollator 5-6L [place] at home baseline ADLs Comments: daughter cooks, showers on her own, daughter helps with hair     Extremity/Trunk Assessment   Upper Extremity Assessment Upper Extremity Assessment: Defer  to OT evaluation LUE Deficits / Details: edema noted at hand compared to R UE    Lower Extremity Assessment Lower Extremity Assessment: RLE deficits/detail;LLE deficits/detail RLE Deficits / Details: AAROM WFL, strength hip flexion 2-/5, knee extension 3-/5, ankle DF 1-/5 LLE Deficits / Details: AAROM WFL, strength hip flexion 2-/5, knee extension 3-/5, ankle DF 1-/5    Cervical / Trunk Assessment Cervical / Trunk Assessment: Kyphotic  Communication   Communication Communication: Impaired Factors Affecting Communication: Reduced clarity of speech    Cognition Arousal: Alert Behavior During Therapy: Flat affect                             Following commands: Intact       Cueing Cueing Techniques: Verbal cues, Visual cues     General Comments General comments (skin integrity, edema, etc.): Patient on HHFNC @30L  45% FiO2, BP 171/61 up in chair, HR stable, RT in to assist with adjusting lines for bed to chair    Exercises     Assessment/Plan    PT Assessment Patient needs continued PT services  PT Problem List Decreased strength;Decreased activity tolerance;Decreased balance;Decreased mobility;Cardiopulmonary status limiting activity;Decreased knowledge of use of DME;Decreased safety awareness       PT Treatment Interventions DME instruction;Gait training;Patient/family education;Functional mobility training;Therapeutic activities;Therapeutic exercise;Balance training;Wheelchair mobility training    PT Goals (Current goals can be found in the Care Plan section)  Acute Rehab PT Goals Patient Stated Goal: return home after rehab PT Goal Formulation: With patient/family Time For Goal Achievement: 07/22/24 Potential to Achieve Goals: Fair    Frequency Min 2X/week     Co-evaluation PT/OT/SLP Co-Evaluation/Treatment: Yes Reason for Co-Treatment: Necessary to address cognition/behavior during functional activity;For patient/therapist safety;To address  functional/ADL transfers PT goals addressed during session: Mobility/safety with mobility;Balance OT goals addressed during session: ADL's and self-care;Strengthening/ROM       AM-PAC PT 6 Clicks Mobility  Outcome Measure Help needed turning from your back to your side while in a flat bed without using bedrails?: Total Help needed moving from lying on your back to sitting on the side of a flat bed without using bedrails?: Total Help needed moving to and from a bed to a chair (including a wheelchair)?: Total Help needed standing up from a chair using your arms (e.g., wheelchair or bedside chair)?: Total Help needed to walk in hospital room?: Total Help needed climbing 3-5 steps with a railing? : Total 6 Click Score: 6    End of Session Equipment Utilized During Treatment: Oxygen  Activity Tolerance: Patient limited by fatigue Patient left: in chair;with call bell/phone within reach;with family/visitor present Nurse Communication: Mobility status PT Visit Diagnosis: Other abnormalities of gait and mobility (R26.89);Muscle weakness (generalized) (M62.81)    Time: 8865-8792 PT Time Calculation (min) (ACUTE ONLY): 33 min   Charges:   PT Evaluation $PT Eval Moderate Complexity: 1 Mod   PT General Charges $$  ACUTE PT VISIT: 1 Visit         Micheline Portal, PT Acute Rehabilitation Services Office:[phone] 07/08/2024   Montie Portal 07/08/2024, 2:29 PM

## 2024-07-08 NOTE — TOC Progression Note (Signed)
 Transition of Care [facility]) - Progression Note    Patient Details  Name: Katie Waller MRN: [id number] Date of Birth: [birth year]  [facility] of [facility]) CM/SW Contact  Lauraine FORBES Saa, LCSWA Phone Number: 07/08/2024, 11:06 AM  Clinical Narrative:     11:06 AM Per chart review, patient has been extubated and is no longer with feeding tube. TOC will continue to follow.  Expected Discharge Plan: Home/Self Care Barriers to Discharge: Continued Medical Work up               Expected Discharge Plan and Services       Living arrangements for the past 2 months: Single Family Home                                       Social Drivers of Health (SDOH) Interventions SDOH Screenings   Food Insecurity: No Food Insecurity (07/04/2024)  Housing: Low Risk  (07/04/2024)  Transportation Needs: No Transportation Needs (07/04/2024)  Utilities: Not At Risk (07/04/2024)  Depression (PHQ2-9): Low Risk  (06/05/2023)  Tobacco Use: Medium Risk (06/25/2024)    Readmission Risk Interventions     No data to display

## 2024-07-08 NOTE — Evaluation (Signed)
 Occupational Therapy Evaluation Patient Details Name: Katie Waller MRN: [id number] DOB: 1946-08-09 Today's Date: 07/08/2024   History of Present Illness   78 yo female found unresponsive in home. Pt found to be hypotensive, presumed sepsis intubated 10/21 extubated 10/24 re intubated 10/25 extubated 11/2 PMH HTN COPID chronic hypoxic 5-6 L [place] baseline, HF , SCC L lower lobw s/p radiation in 05/2024     Clinical Impressions PT admitted with sepsis. Pt currently with functional limitiations due to the deficits listed below (see OT problem list). Pt at baseline using rollator with 5L [place] mod I transfers from bed, to bathroom and around the home. Pt at this time time dependent on 2 person (A) to pivot to chair with heavy assistance. Daughter educated and agreeable to community skilled therapy.  Pt will benefit from skilled OT to increase their independence and safety with adls and balance to allow discharge skilled inpatient follow up therapy, <3 hours/day. .      If plan is discharge home, recommend the following:   Two people to help with walking and/or transfers;Two people to help with bathing/dressing/bathroom     Functional Status Assessment   Patient has had a recent decline in their functional status and demonstrates the ability to make significant improvements in function in a reasonable and predictable amount of time.     Equipment Recommendations   [employer] insurance and ann[employer];Wheelchair cushion (measurements OT);Wheelchair (measurements OT);BSC/3in1     Recommendations for Other Services   PT consult     Precautions/Restrictions   Precautions Precautions: Fall Precaution/Restrictions Comments: watch 02 BP Restrictions Weight Bearing Restrictions Per Provider Order: No     Mobility Bed Mobility Overal bed mobility: Needs Assistance Bed Mobility: Supine to Sit, Rolling, Sit to Supine Rolling: Mod assist   Supine to sit: Mod assist     General bed mobility comments:  pt needs hob 45 degrees with pad used to help advance toward Eob. pt able to (A) with sliding BLE. Pt with support to static sit    Transfers Overall transfer level: Needs assistance Equipment used: 2 person hand held assist Transfers: Sit to/from Stand, Bed to chair/wheelchair/BSC Sit to Stand: +2 physical assistance, Max assist, From elevated surface   Squat pivot transfers: +2 physical assistance, Total assist       General transfer comment: pt requires blocking use of pad and anterior weight shift. pt initiates but unable to sustain      Balance Overall balance assessment: Needs assistance Sitting-balance support: No upper extremity supported, Feet supported Sitting balance-Leahy Scale: Poor                                     ADL either performed or assessed with clinical judgement   ADL Overall ADL's : Needs assistance/impaired     Grooming: Oral care;Maximal assistance;Sitting Grooming Details (indicate cue type and reason): decreased grasp and unable to sustain against gravity Upper Body Bathing: Maximal assistance   Lower Body Bathing: Total assistance   Upper Body Dressing : Maximal assistance   Lower Body Dressing: Total assistance                       Vision         Perception         Praxis         Pertinent Vitals/Pain Pain Assessment Pain Assessment: No/denies pain     Extremity/Trunk  Assessment Upper Extremity Assessment Upper Extremity Assessment: Generalized weakness;Right hand dominant;LUE deficits/detail LUE Deficits / Details: edema noted at hand compared to R UE   Lower Extremity Assessment Lower Extremity Assessment: Defer to PT evaluation;Generalized weakness   Cervical / Trunk Assessment Cervical / Trunk Assessment: Kyphotic   Communication Communication Communication: Impaired Factors Affecting Communication: Reduced clarity of speech   Cognition Arousal: Alert Behavior During Therapy: Flat  affect Cognition: No apparent impairments                               Following commands: Intact       Cueing  General Comments   Cueing Techniques: Verbal cues;Visual cues  30L HHFNC 45% FIO2 BP 164/88 sitting 171/61 in chair. pt needs increased time to recover with transfer to chair.   Exercises Exercises: General Lower Extremity General Exercises - Lower Extremity Ankle Circles/Pumps:  (no ankle flexion during session) Quad Sets: Both, 15 reps, Seated   Shoulder Instructions      Home Living Family/patient expects to be discharged to:: Private residence   Available Help at Discharge: Family Type of Home: House Home Access: Stairs to enter [employer] of Steps: 2 on front, on back stairs with ramp   Home Layout: One level     Bathroom Shower/Tub: Producer, Television/film/video: Handicapped height Bathroom Accessibility: Yes   Home Equipment: Hand held shower head;Shower seat;Grab bars - tub/shower;Rollator (4 wheels);Transport chair;Other (comment) (adjustable bed)   Additional Comments: daughter works from home with in office hours on monday. son lives next door and can (A) during the times that daughter is out of home      Prior Functioning/Environment Prior Level of Function : Needs assist             Mobility Comments: walks with rollator 5-6L [place] at home baseline ADLs Comments: daughter cooks, showers on her own, daughter helps with hair    OT Problem List: Decreased strength;Decreased activity tolerance;Impaired balance (sitting and/or standing);Decreased safety awareness;Decreased knowledge of use of DME or AE;Decreased knowledge of precautions;Cardiopulmonary status limiting activity   OT Treatment/Interventions: Self-care/ADL training;Therapeutic exercise;Energy conservation;DME and/or AE instruction;Therapeutic activities;Patient/family education;Balance training;Manual therapy      OT Goals(Current goals can be found  in the care plan section)   Acute Rehab OT Goals Patient Stated Goal: to go home after therapy OT Goal Formulation: With patient/family Time For Goal Achievement: 07/22/24 Potential to Achieve Goals: Good   OT Frequency:  Min 2X/week    Co-evaluation PT/OT/SLP Co-Evaluation/Treatment: Yes Reason for Co-Treatment: Necessary to address cognition/behavior during functional activity;For patient/therapist safety;To address functional/ADL transfers   OT goals addressed during session: ADL's and self-care;Strengthening/ROM      AM-PAC OT 6 Clicks Daily Activity     Outcome Measure Help from another person eating meals?: A Lot Help from another person taking care of personal grooming?: A Lot Help from another person toileting, which includes using toliet, bedpan, or urinal?: Total Help from another person bathing (including washing, rinsing, drying)?: A Lot Help from another person to put on and taking off regular upper body clothing?: A Lot Help from another person to put on and taking off regular lower body clothing?: Total 6 Click Score: 10   End of Session Equipment Utilized During Treatment: Oxygen  Nurse Communication: Mobility status;Precautions  Activity Tolerance: Patient tolerated treatment well Patient left: in chair;with call bell/phone within reach;with chair alarm set;with family/visitor present  OT Visit  Diagnosis: Unsteadiness on feet (R26.81);Muscle weakness (generalized) (M62.81)                Time: 8865-8795 OT Time Calculation (min): 30 min Charges:  OT General Charges $OT Visit: 1 Visit OT Evaluation $OT Eval High Complexity: 1 High   Brynn, OTR/L  Acute Rehabilitation Services Office: [phone] .   Katie Waller 07/08/2024, 12:22 PM

## 2024-07-08 NOTE — Progress Notes (Signed)
 PHARMACY - ANTICOAGULATION CONSULT NOTE  Pharmacy Consult for IV bivalirudin Indication: atrial fibrillation  Allergies  Allergen Reactions   Ace Inhibitors Swelling   Diphenhydramine Other (See Comments)    Hyperactive/jittery    Doxycycline Hyclate Nausea And Vomiting   Codeine Nausea And Vomiting    Patient Measurements: Height: 5' 1 (154.9 cm) Weight: 66.8 kg (147 lb 4.3 oz) IBW/kg (Calculated) : 47.8 HEPARIN DW (KG): 63.1  Vital Signs: Temp: 98.4 F (36.9 C) (11/03 0728) Temp Source: Axillary (11/03 0728) BP: 168/58 (11/03 0700) Pulse Rate: 82 (11/03 0735)  Labs: Recent Labs    07/06/24 0515 07/07/24 1003 07/08/24 0533  HGB 9.0* 9.3* 8.4*  HCT 28.8* 30.8* 27.9*  PLT 487* 434* 396  APTT 56* 57* 73*  CREATININE 1.18* 0.94 0.79    Estimated Creatinine Clearance: 53.1 mL/min (by C-G formula based on SCr of 0.79 mg/dL).  Assessment: 78 yo F presenting with pneumonia requiring intubation. Pt now with new afib RVR; no AC PTA. Patient was previously on heparin with labile heparin levels/atypical dosing range. Therefore, pharmacy consulted to dose bivalirudin.   aPTT 73 - therapeutic on 0.15 mg/kg/hr CBC low w Hgb 8.4, Plt 396   Goal of Therapy:  aPTT 50-85 seconds Monitor platelets by anticoagulation protocol: Yes   Plan:  Continue bivalirudin 0.15 mg/kg/hr  Monitor daily aPTT, CBC, signs/symptoms of bleeding   Sharyne Glatter, PharmD, BCCCP Critical Care Clinical Pharmacist 07/08/2024 7:53 AM

## 2024-07-09 DIAGNOSIS — I480 Paroxysmal atrial fibrillation: Secondary | ICD-10-CM | POA: Diagnosis not present

## 2024-07-09 DIAGNOSIS — A419 Sepsis, unspecified organism: Secondary | ICD-10-CM | POA: Diagnosis not present

## 2024-07-09 DIAGNOSIS — J9621 Acute and chronic respiratory failure with hypoxia: Secondary | ICD-10-CM | POA: Diagnosis not present

## 2024-07-09 DIAGNOSIS — E876 Hypokalemia: Secondary | ICD-10-CM

## 2024-07-09 DIAGNOSIS — I10 Essential (primary) hypertension: Secondary | ICD-10-CM

## 2024-07-09 DIAGNOSIS — B9781 Human metapneumovirus as the cause of diseases classified elsewhere: Secondary | ICD-10-CM | POA: Diagnosis not present

## 2024-07-09 LAB — BASIC METABOLIC PANEL WITH GFR
Anion gap: 12 (ref 5–15)
BUN: 29 mg/dL — ABNORMAL HIGH (ref 8–23)
CO2: 29 mmol/L (ref 22–32)
Calcium: 9.1 mg/dL (ref 8.9–10.3)
Chloride: 102 mmol/L (ref 98–111)
Creatinine, Ser: 0.78 mg/dL (ref 0.44–1.00)
GFR, Estimated: >60 mL/min (ref >60)
Glucose, Bld: 94 mg/dL (ref 70–99)
Potassium: 4.3 mmol/L (ref 3.5–5.1)
Sodium: 143 mmol/L (ref 135–145)

## 2024-07-09 LAB — APTT: aPTT: 52 s — ABNORMAL HIGH (ref 24–36)

## 2024-07-09 LAB — CBC
HCT: 31.6 % — ABNORMAL LOW (ref 36.0–46.0)
Hemoglobin: 9.2 g/dL — ABNORMAL LOW (ref 12.0–15.0)
MCH: 29.2 pg (ref 26.0–34.0)
MCHC: 29.1 g/dL — ABNORMAL LOW (ref 30.0–36.0)
MCV: 100.3 fL — ABNORMAL HIGH (ref 80.0–100.0)
Platelets: 422 K/uL — ABNORMAL HIGH (ref 150–400)
RBC: 3.15 MIL/uL — ABNORMAL LOW (ref 3.87–5.11)
RDW: 13.9 % (ref 11.5–15.5)
WBC: 11.9 K/uL — ABNORMAL HIGH (ref 4.0–10.5)
nRBC: 0 % (ref 0.0–0.2)

## 2024-07-09 LAB — GLUCOSE, CAPILLARY
Glucose-Capillary: 97 mg/dL (ref 70–99)
Glucose-Capillary: 80 mg/dL (ref 70–99)
Glucose-Capillary: 267 mg/dL — ABNORMAL HIGH (ref 70–99)
Glucose-Capillary: 151 mg/dL — ABNORMAL HIGH (ref 70–99)
Glucose-Capillary: 76 mg/dL (ref 70–99)
Glucose-Capillary: 175 mg/dL — ABNORMAL HIGH (ref 70–99)

## 2024-07-09 LAB — TRIGLYCERIDES: Triglycerides: 207 mg/dL — ABNORMAL HIGH (ref <150)

## 2024-07-09 LAB — LIPASE, BLOOD: Lipase: 164 U/L — ABNORMAL HIGH (ref 11–51)

## 2024-07-09 MED ORDER — POLYETHYLENE GLYCOL 3350 17 G PO PACK: 17.0000 g | PACK | Freq: Every day | ORAL | Status: DC | PRN

## 2024-07-09 MED ORDER — AMLODIPINE BESYLATE 5 MG PO TABS
5.0000 mg | ORAL_TABLET | Freq: Every day | ORAL | Status: DC
  Administered 2024-07-09: 5 mg via ORAL
  Filled 2024-07-09 (×2): qty 1

## 2024-07-09 MED ORDER — LORAZEPAM 0.5 MG PO TABS: 0.5000 mg | ORAL_TABLET | Freq: Three times a day (TID) | ORAL | Status: DC | PRN

## 2024-07-09 MED ORDER — ENOXAPARIN SODIUM 40 MG/0.4ML IJ SOSY
40.0000 mg | PREFILLED_SYRINGE | INTRAMUSCULAR | Status: DC
  Administered 2024-07-09 – 2024-07-12 (×4): 40 mg via SUBCUTANEOUS
  Filled 2024-07-09 (×4): qty 0.4

## 2024-07-09 MED ORDER — SIMETHICONE 80 MG PO CHEW: 160.0000 mg | CHEWABLE_TABLET | Freq: Three times a day (TID) | ORAL | Status: DC | PRN

## 2024-07-09 MED ORDER — FUROSEMIDE 10 MG/ML IJ SOLN
20.0000 mg | Freq: Once | INTRAMUSCULAR | Status: AC
  Administered 2024-07-09: 20 mg via INTRAVENOUS
  Filled 2024-07-09: qty 2

## 2024-07-09 MED ORDER — ORAL CARE MOUTH RINSE
15.0000 mL | OROMUCOSAL | Status: DC | PRN
Start: 2024-07-09 — End: 2024-07-14

## 2024-07-09 MED ORDER — BOOST / RESOURCE BREEZE PO LIQD CUSTOM
1.0000 | Freq: Three times a day (TID) | ORAL | Status: DC
  Administered 2024-07-09 – 2024-07-11 (×5): 1 via ORAL

## 2024-07-09 NOTE — Progress Notes (Signed)
 eLink Physician-Brief Progress Note Patient Name: JAIANNA NICOLL DOB: 03-19-1946 MRN: [id number]   Date of Service  07/09/2024  HPI/Events of Note  Initially presented with encephalopathy and hypoxemia requiring intubation mechanical ventilation in the setting of bacterial pneumonia and known cancer.  Code stroke was initially called and canceled.  NIH stroke scale/neuromonitoring was ordered but never completed  eICU Interventions  Discontinue stroke scale orders as patient is clinically improved and this would align with the patient's current care   0344 - new Stage II and new DTI identified; wound care consult  Intervention Category Minor Interventions: Routine modifications to care plan (e.g. PRN medications for pain, fever)  Chazlyn Cude 07/09/2024, 8:33 PM

## 2024-07-09 NOTE — TOC Progression Note (Signed)
 Transition of Care [facility]) - Progression Note    Patient Details  Name: Katie Waller MRN: [id number] Date of Birth: Nov 02, 1945  Transition of [facility]) CM/SW Contact  Lauraine FORBES Saa, LCSWA Phone Number: 07/09/2024, 3:35 PM  Clinical Narrative:     3:35 PM CSW introduced self and role to patient's daughter as patient appeared asleep. CSW informed patient's daughter of therapy's recommendation of patient discharging to SNF. Patient's daughter was agreeable with SNF and consented CSW to send patient's FL2 to SNFs near [facility], as well as [facility]. CSW will send patient's FL2 to SNFs when patient weans off HHFNC. CSW will continue to follow.  Expected Discharge Plan: Skilled Nursing Facility Barriers to Discharge: Continued Medical Work up, English As A Second Language Teacher, SNF Pending bed offer               Expected Discharge Plan and Services In-house Referral: Clinical Social Work   Post Acute Care Choice: Skilled Nursing Facility Living arrangements for the past 2 months: Single Family Home                                       Social Drivers of Health (SDOH) Interventions SDOH Screenings   Food Insecurity: No Food Insecurity (07/04/2024)  Housing: Low Risk  (07/04/2024)  Transportation Needs: No Transportation Needs (07/04/2024)  Utilities: Not At Risk (07/04/2024)  Depression (PHQ2-9): Low Risk  (06/05/2023)  Tobacco Use: Medium Risk (06/25/2024)    Readmission Risk Interventions     No data to display

## 2024-07-09 NOTE — Progress Notes (Signed)
 NAMEKIMBERLIE CSASZAR, MRN:  [id number], DOB:  Mar 13, 1946, LOS: 14 ADMISSION DATE:  06/25/2024, CONSULTATION DATE:  06/25/2024 REFERRING MD:  Theodoro SAUNDERS, CHIEF COMPLAINT:  AMS, Sepsis, AR  History of Present Illness:  Cheris Tweten is a 78 yo female with past medical history significant for HTN, COPD, acute on chronic hypoxic, hypercapnic respiratory failure on 5-6L [place] baseline, diastolic heart failure, SCC Left lower lobe s/p radiation in September 2025, who presented via EMS after being found unresponsive at home. Code stroke called. On arrival to ED patient emergently intubated for acute respiratory distress and airway protection. CT Head with no ICH or large infarct, CTA head and neck with no LVO. CXR concerning for a Pneumonia with visible BL lung apices on CTA demonstrating infection/inflammation. Code stroke cancelled with presentation suspicious for sepsis. Patient received 30ml/kg crystalloid, pan cultured, and started empirically on Cefepime/Vanc/Azithromycin . Of note, family reports patient has recently not felt well and likely has had a cold. PCCM consulted for ICU admission.   On exam in ED, patient febrile to >101 and hypotensive, likely multifactorial in setting of presumed sepsis and recent sedation on induction with underlying heart disease. Norepinephrine gtt was initiated for MAPs < 65 and patient was transferred to ICU with plans to gain central access for vasopressor administration and arterial line placement for hemodynamic monitoring.  Pertinent  Medical History  Per above  Significant Hospital Events: Including procedures, antibiotic start and stop dates in addition to other pertinent events   Found unresponsive at home->ED>Code stroke>intubated for ARD/airway protection>CTH/CTA negative>Code stroke cancelled>Hypotensive>started on pressors>Admit to ICU  Arterial line placed 10/21 10/22 and 10/23 unsuccessful extubation 10/24: Despite maximal Precedex and fentanyl   pushes, unable to safely and comfortably clean the patient up so added prn Versed and started fentanyl  infusion. Rectal tube added for diarrhea.  10/24: 2/3 BC resulted in staph capitis/epidermitis, extubated on BiPAP 10/25 patient remained on BiPAP for 24 hours, when she came off of BiPAP, could not tolerate became tachypneic, tachycardic went into respiratory distress requiring endotracheal reintubation 10/29 unsuccessful SBT, went briefly back into RVR 10/30 unsuccessful weaning from sedation as BP/HR increased considerably 11/2: extubated to HFNC  Interim History / Subjective:  Patient stable for floor transfer  Objective    Blood pressure (!) 132/50, pulse 88, temperature 98.4 F (36.9 C), temperature source Oral, resp. rate (!) 21, height 5' 1 (1.549 m), weight 66.8 kg, SpO2 100%.    FiO2 (%):  [44 %-78 %] 60 %   Intake/Output Summary (Last 24 hours) at 07/09/2024 0951 Last data filed at 07/09/2024 0700 Gross per 24 hour  Intake 854.11 ml  Output 850 ml  Net 4.11 ml   Filed Weights   07/06/24 0500 07/07/24 0500 07/08/24 0500  Weight: 70.4 kg 69.3 kg 66.8 kg    Examination: General: lying in bed in NAD HEENT:  [place]/AT, eyes anicteric Lungs: no wheezing, decent air movement Cardiovascular: RRR, no mrg Abd soft, NT, BS present Extremities: improved edema Neuro: alert and oriented x 4  Resolved problem list   Assessment and Plan  Acute on chronic hypoxic & hypercapnic respiratory failure Acute COPD exacerbation Human metapneumovirus pneumonia probably superimposed bacterial PNA 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  Afebrile, Labs: WBC 15.9 > 12.6 > 11.9 -con't triple nebs -Tracheal aspirate grew few candida albicans, likely contaminant,  D/C'd Cefepime and Vanc 11/3 -completed steroid taper   Afib with RVR, new onset, paroxysmal Chronic  HFpEF HTN NSR for the past few days. Transitioned off Amio and  Lopressor  11/3 without complication. Bival d/c today. Started home Losartan  11/3 but remained hypertensive so started Norvasc  5 today. -Tele monitoring, would benefit from Zio patch outpatient and cardiology follow-up   Anemia- stable -transfuse for Hb <7 or hemodynamically significant bleeding   Ileus, resolved Nausea Tolerated clear liquid > full liquid and will ADAT. Continue Reglan .    Obesity -long term recommend modest weight loss   Hyperglycemia -SSI PRN, may require dose adjustment as diet is advanced   AKI, resolved -strict I/O -monitor, may need additional diuresis -Trend BMP  Labs   CBC: Recent Labs  Lab 07/05/24 0405 07/06/24 0515 07/07/24 1003 07/08/24 0533 07/09/24 0606  WBC 13.4* 17.2* 15.9* 12.6* 11.9*  HGB 8.4* 9.0* 9.3* 8.4* 9.2*  HCT 27.6* 28.8* 30.8* 27.9* 31.6*  MCV 94.5 92.6 95.7 97.2 100.3*  PLT 395 487* 434* 396 422*    Basic Metabolic Panel: Recent Labs  Lab 07/05/24 0405 07/06/24 0515 07/07/24 1003 07/08/24 0533 07/08/24 1525 07/09/24 0606  NA 140 139 141 143  --  143  K 4.5 3.5 3.4* 3.0*  --  4.3  CL 86* 87* 94* 99  --  102  CO2 40* 37* 29 32  --  29  GLUCOSE 145* 94 103* 91  --  94  BUN 45* 41* 34* 28*  --  29*  CREATININE 1.11* 1.18* 0.94 0.79  --  0.78  CALCIUM  9.7 9.9 9.5 9.2  --  9.1  MG  --  2.1  --   --  2.3  --    GFR: Estimated Creatinine Clearance: 53.1 mL/min (by C-G formula based on SCr of 0.78 mg/dL). Recent Labs  Lab 07/06/24 0515 07/07/24 1003 07/08/24 0533 07/09/24 0606  WBC 17.2* 15.9* 12.6* 11.9*    Liver Function Tests: Recent Labs  Lab 07/08/24 0918  AST 46*  ALT 50*  ALKPHOS 42  BILITOT 0.7  PROT 5.9*  ALBUMIN 2.3*   Recent Labs  Lab 07/08/24 0918 07/09/24 0606  LIPASE 165* 164*   No results for input(s): AMMONIA in the last 168 hours.  ABG    Component Value Date/Time   PHART 7.458 (H) 06/30/2024 0836   PCO2ART 41.9 06/30/2024 0836   PO2ART 204 (H) 06/30/2024 0836   HCO3 29.6  (H) 06/30/2024 0836   TCO2 31 06/30/2024 0836   O2SAT 100 06/30/2024 0836     Coagulation Profile: No results for input(s): INR, PROTIME in the last 168 hours.  Cardiac Enzymes: No results for input(s): CKTOTAL, CKMB, CKMBINDEX, TROPONINI in the last 168 hours.  HbA1C: Hgb A1c MFr Bld  Date/Time Value Ref Range Status  06/26/2024 04:47 AM 5.6 4.8 - 5.6 % Final    Comment:    (NOTE) Diagnosis of Diabetes The following HbA1c ranges recommended by the American Diabetes Association (ADA) may be used as an aid in the diagnosis of diabetes mellitus.  Hemoglobin             Suggested A1C NGSP%              Diagnosis  <5.7                   Non Diabetic  5.7-6.4                Pre-Diabetic  >6.4                   Diabetic  <  7.0                   Glycemic control for                       adults with diabetes.    09/17/2013 01:57 PM 6.1 (H) <5.7 % Final    Comment:    (NOTE)                                                                       According to the ADA Clinical Practice Recommendations for 2011, when HbA1c is used as a screening test:  >=6.5%   Diagnostic of Diabetes Mellitus           (if abnormal result is confirmed) 5.7-6.4%   Increased risk of developing Diabetes Mellitus References:Diagnosis and Classification of Diabetes Mellitus,Diabetes Care,2011,34(Suppl 1):S62-S69 and Standards of Medical Care in         Diabetes - 2011,Diabetes Care,2011,34 (Suppl 1):S11-S61.    CBG: Recent Labs  Lab 07/08/24 1524 07/08/24 1938 07/08/24 2332 07/09/24 0320 07/09/24 0748  GLUCAP 104* 103* 106* 97 80    Review of Systems:     Past Medical History:  She,  has a past medical history of Abnormal finding on EKG (09/19/2013), Acute on chronic respiratory failure with hypercapnia (HCC), Acute respiratory failure (HCC) (06/08/2016), Anxiety (11/09/2020), Arthritis, Asthma, Carotid bruit (11/09/2020), Chronic diastolic CHF (congestive heart failure) (HCC)  (07/28/2017), Chronic obstructive pulmonary disease, unspecified (HCC) (11/09/2020), COPD, group D, by GOLD 2017 classification (HCC) (09/17/2013), Cough (11/09/2020), Decreased estrogen level (11/09/2020), Diabetes (HCC), Dyspnea, Edema (11/09/2020), Essential hypertension (09/18/2013), Goals of care, counseling/discussion, Hardening of the aorta (main artery of the heart) (11/09/2020), Heart disease, History of radiation therapy, Hypertension, Hypertensive heart failure (HCC) (11/09/2020), Hypoxia (11/09/2020), Insomnia (11/09/2020), Iron deficiency anemia (11/09/2020), Large liver (11/09/2020), Near syncope (09/17/2013), Osteoporosis (11/09/2020), Palliative care encounter, Prediabetes (09/19/2013), Pure hypercholesterolemia (11/09/2020), Raynaud's disease (11/09/2020), Sinus tachycardia (09/18/2013), Skin sensation disturbance (11/09/2020), Smoker (09/17/2013), 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   Surgical History:   Past Surgical History:  Procedure Laterality Date   BRONCHIAL BIOPSY  05/25/2023   Procedure: BRONCHIAL BIOPSIES;  Surgeon: Brenna Adine CROME, DO;  Location: [facility];  Service: Pulmonary;;   BRONCHIAL NEEDLE ASPIRATION BIOPSY  05/25/2023   Procedure: BRONCHIAL NEEDLE ASPIRATION BIOPSIES;  Surgeon: Brenna Adine CROME, DO;  Location: [facility];  Service: Pulmonary;;   FIDUCIAL MARKER PLACEMENT  05/25/2023   Procedure: FIDUCIAL MARKER PLACEMENT;  Surgeon: Brenna Adine CROME, DO;  Location: [facility];  Service: Pulmonary;;   KNEE SURGERY Left      Social History:   reports that she quit smoking about 6 years ago. Her smoking use included cigarettes. She started smoking about 6 years ago. She has a 55 pack-year smoking history. She has never used smokeless tobacco. She reports that she does not drink alcohol and does not use drugs.   Family History:  Her family history  includes Hyperlipidemia in her father and mother; Hypertension in her father and mother.   Allergies Allergies  Allergen Reactions   Ace Inhibitors Swelling   Diphenhydramine Other (See Comments)    Hyperactive/jittery  Doxycycline Hyclate Nausea And Vomiting   Codeine Nausea And Vomiting     Home Medications  Prior to Admission medications   Medication Sig Start Date End Date Taking? Authorizing Provider  acetaminophen  (TYLENOL ) 500 MG tablet Take 1,000 mg by mouth every 6 (six) hours as needed for moderate pain.   Yes [provider]  albuterol  (PROVENTIL  HFA;VENTOLIN  HFA) 108 (90 BASE) MCG/ACT inhaler Inhale 2 puffs into the lungs every 6 (six) hours as needed for wheezing or shortness of breath. 09/19/13  Yes Johnson, Clanford L, MD  aspirin  81 MG chewable tablet Chew 1 tablet (81 mg total) by mouth daily. 09/19/13  Yes Johnson, Clanford L, MD  atorvastatin  (LIPITOR) 10 MG tablet TAKE 1 TABLET BY MOUTH EVERY DAY 07/21/23  Yes Krasowski, Robert J, MD  benzonatate  (TESSALON ) 100 MG capsule Take 100 mg by mouth 3 (three) times daily as needed for cough.    Yes [provider]  Biotin 10 MG CAPS Take 10 mg by mouth daily.   Yes [provider]  budesonide -formoterol  (SYMBICORT ) 160-4.5 MCG/ACT inhaler Inhale 2 puffs into the lungs 2 (two) times daily. 08/23/19  Yes Mannam, Praveen, MD  calcium  carbonate (OSCAL) 1500 (600 Ca) MG TABS tablet Take 600 mg of elemental calcium  by mouth daily.   Yes [provider]  Cholecalciferol (VITAMIN D ) 50 MCG (2000 UT) tablet Take 2,000 Units by mouth daily.   Yes [provider]  diphenhydramine-acetaminophen  (TYLENOL  PM) 25-500 MG TABS tablet Take 1 tablet by mouth at bedtime.   Yes [provider]  furosemide  (LASIX ) 40 MG tablet Take 40 mg by mouth daily.   Yes [provider]  gabapentin (NEURONTIN) 300 MG capsule Take 900 mg by mouth at bedtime.   Yes [provider]  iron  polysaccharides (NIFEREX) 150 MG capsule Take 150 mg by mouth daily. 11/05/20  Yes [provider]  loratadine (CLARITIN) 10 MG tablet Take 10 mg by mouth daily as needed for allergies.   Yes [provider]  LORazepam  (ATIVAN ) 1 MG tablet Take 1 tablet (1 mg total) by mouth every 6 (six) hours as needed for anxiety. 08/07/17  Yes Drusilla Sabas RAMAN, MD  losartan  (COZAAR ) 50 MG tablet Take 50 mg by mouth daily.   Yes [provider]  Multiple Vitamin (MULTIVITAMIN WITH MINERALS) TABS tablet Take 1 tablet by mouth daily.   Yes [provider]  raloxifene  (EVISTA ) 60 MG tablet Take 60 mg by mouth every morning. 08/07/20  Yes [provider]  Tiotropium Bromide  Monohydrate (SPIRIVA  RESPIMAT) 2.5 MCG/ACT AERS Inhale 1 puff into the lungs daily.   Yes [provider]  traZODone  (DESYREL ) 100 MG tablet Take 1 tablet (100 mg total) by mouth at bedtime as needed for sleep. 08/07/17  Yes Drusilla Sabas RAMAN, MD     Norman Lobstein, DO

## 2024-07-09 NOTE — Progress Notes (Signed)
 Pt NT suctioned at this time. Removed small white/clear amount.

## 2024-07-09 NOTE — Plan of Care (Addendum)
 This patient remains on MC-13M as of time of writing; awaiting transfer to PCU. The patient is AA+Ox3-4. NIHSS = 8 by my assessment; orders for assessment now discontinued. The patient is still requiring HHFNC at 20 L/min at 36% FiO2. The patient is in sinus rhythm with frequent PACs. PIV access only. Q 4 hour  CBG checks with SSI. The patient's family remains at bedside overnight.   Problem: Health Behavior/Discharge Planning: Goal: Ability to manage health-related needs will improve Outcome: Progressing   Problem: Clinical Measurements: Goal: Ability to maintain clinical measurements within normal limits will improve Outcome: Progressing Goal: Will remain free from infection Outcome: Progressing Goal: Diagnostic test results will improve Outcome: Progressing Goal: Respiratory complications will improve Outcome: Progressing Goal: Cardiovascular complication will be avoided Outcome: Progressing   Problem: Activity: Goal: Risk for activity intolerance will decrease Outcome: Progressing   Problem: Nutrition: Goal: Adequate nutrition will be maintained Outcome: Progressing   Problem: Coping: Goal: Level of anxiety will decrease Outcome: Progressing   Problem: Elimination: Goal: Will not experience complications related to bowel motility Outcome: Progressing Goal: Will not experience complications related to urinary retention Outcome: Progressing   Problem: Pain Managment: Goal: General experience of comfort will improve and/or be controlled Outcome: Progressing   Problem: Safety: Goal: Ability to remain free from injury will improve Outcome: Progressing   Problem: Skin Integrity: Goal: Risk for impaired skin integrity will decrease Outcome: Progressing   Problem: Education: Goal: Ability to describe self-care measures that may prevent or decrease complications (Diabetes Survival Skills Education) will improve Outcome: Progressing Goal: Individualized Educational  Video(s) Outcome: Progressing   Problem: Coping: Goal: Ability to adjust to condition or change in health will improve Outcome: Progressing   Problem: Fluid Volume: Goal: Ability to maintain a balanced intake and output will improve Outcome: Progressing   Problem: Health Behavior/Discharge Planning: Goal: Ability to identify and utilize available resources and services will improve Outcome: Progressing Goal: Ability to manage health-related needs will improve Outcome: Progressing   Problem: Metabolic: Goal: Ability to maintain appropriate glucose levels will improve Outcome: Progressing   Problem: Nutritional: Goal: Maintenance of adequate nutrition will improve Outcome: Progressing Goal: Progress toward achieving an optimal weight will improve Outcome: Progressing   Problem: Skin Integrity: Goal: Risk for impaired skin integrity will decrease Outcome: Progressing   Problem: Tissue Perfusion: Goal: Adequacy of tissue perfusion will improve Outcome: Progressing   Problem: Fluid Volume: Goal: Hemodynamic stability will improve Outcome: Progressing   Problem: Clinical Measurements: Goal: Diagnostic test results will improve Outcome: Progressing Goal: Signs and symptoms of infection will decrease Outcome: Progressing   Problem: Respiratory: Goal: Ability to maintain adequate ventilation will improve Outcome: Progressing   Problem: Education: Goal: Knowledge of disease or condition will improve Outcome: Progressing Goal: Knowledge of the prescribed therapeutic regimen will improve Outcome: Progressing Goal: Individualized Educational Video(s) Outcome: Progressing   Problem: Activity: Goal: Ability to tolerate increased activity will improve Outcome: Progressing Goal: Will verbalize the importance of balancing activity with adequate rest periods Outcome: Progressing   Problem: Respiratory: Goal: Ability to maintain a clear airway will improve Outcome:  Progressing Goal: Levels of oxygenation will improve Outcome: Progressing Goal: Ability to maintain adequate ventilation will improve Outcome: Progressing   Problem: Education: Goal: Ability to demonstrate management of disease process will improve Outcome: Progressing Goal: Ability to verbalize understanding of medication therapies will improve Outcome: Progressing Goal: Individualized Educational Video(s) Outcome: Progressing   Problem: Activity: Goal: Capacity to carry out activities will improve Outcome: Progressing  Problem: Cardiac: Goal: Ability to achieve and maintain adequate cardiopulmonary perfusion will improve Outcome: Progressing   Problem: Education: Goal: Knowledge of disease or condition will improve Outcome: Progressing Goal: Knowledge of the prescribed therapeutic regimen will improve Outcome: Progressing Goal: Individualized Educational Video(s) Outcome: Progressing   Problem: Activity: Goal: Ability to tolerate increased activity will improve Outcome: Progressing Goal: Will verbalize the importance of balancing activity with adequate rest periods Outcome: Progressing   Problem: Respiratory: Goal: Ability to maintain a clear airway will improve Outcome: Progressing Goal: Levels of oxygenation will improve Outcome: Progressing Goal: Ability to maintain adequate ventilation will improve Outcome: Progressing   Problem: Education: Goal: Knowledge of disease or condition will improve Outcome: Progressing Goal: Understanding of medication regimen will improve Outcome: Progressing Goal: Individualized Educational Video(s) Outcome: Progressing   Problem: Activity: Goal: Ability to tolerate increased activity will improve Outcome: Progressing   Problem: Cardiac: Goal: Ability to achieve and maintain adequate cardiopulmonary perfusion will improve Outcome: Progressing   Problem: Health Behavior/Discharge Planning: Goal: Ability to safely manage  health-related needs after discharge will improve Outcome: Progressing

## 2024-07-10 ENCOUNTER — Inpatient Hospital Stay (HOSPITAL_COMMUNITY)

## 2024-07-10 DIAGNOSIS — I3139 Other pericardial effusion (noninflammatory): Secondary | ICD-10-CM | POA: Diagnosis not present

## 2024-07-10 DIAGNOSIS — J9622 Acute and chronic respiratory failure with hypercapnia: Secondary | ICD-10-CM | POA: Diagnosis not present

## 2024-07-10 DIAGNOSIS — I4891 Unspecified atrial fibrillation: Secondary | ICD-10-CM

## 2024-07-10 DIAGNOSIS — J123 Human metapneumovirus pneumonia: Secondary | ICD-10-CM | POA: Diagnosis not present

## 2024-07-10 DIAGNOSIS — J9621 Acute and chronic respiratory failure with hypoxia: Secondary | ICD-10-CM | POA: Diagnosis not present

## 2024-07-10 DIAGNOSIS — I480 Paroxysmal atrial fibrillation: Secondary | ICD-10-CM | POA: Diagnosis not present

## 2024-07-10 LAB — ECHOCARDIOGRAM LIMITED
AV Peak grad: 12.7 mmHg
Ao pk vel: 1.78 m/s
Area-P 1/2: 4.49 cm2
Height: 61 in
S' Lateral: 2.9 cm
Weight: 2264.57 [oz_av]

## 2024-07-10 LAB — BASIC METABOLIC PANEL WITH GFR
Anion gap: 11 (ref 5–15)
BUN: 24 mg/dL — ABNORMAL HIGH (ref 8–23)
CO2: 32 mmol/L (ref 22–32)
Calcium: 9 mg/dL (ref 8.9–10.3)
Chloride: 98 mmol/L (ref 98–111)
Creatinine, Ser: 0.85 mg/dL (ref 0.44–1.00)
GFR, Estimated: >60 mL/min (ref >60)
Glucose, Bld: 111 mg/dL — ABNORMAL HIGH (ref 70–99)
Potassium: 3.3 mmol/L — ABNORMAL LOW (ref 3.5–5.1)
Sodium: 141 mmol/L (ref 135–145)

## 2024-07-10 LAB — GLUCOSE, CAPILLARY
Glucose-Capillary: 99 mg/dL (ref 70–99)
Glucose-Capillary: 94 mg/dL (ref 70–99)
Glucose-Capillary: 241 mg/dL — ABNORMAL HIGH (ref 70–99)
Glucose-Capillary: 126 mg/dL — ABNORMAL HIGH (ref 70–99)
Glucose-Capillary: 112 mg/dL — ABNORMAL HIGH (ref 70–99)

## 2024-07-10 LAB — MAGNESIUM: Magnesium: 2.1 mg/dL (ref 1.7–2.4)

## 2024-07-10 LAB — BRAIN NATRIURETIC PEPTIDE: B Natriuretic Peptide: 661.1 pg/mL — ABNORMAL HIGH (ref 0.0–100.0)

## 2024-07-10 MED ORDER — ACETAMINOPHEN 325 MG PO TABS
650.0000 mg | ORAL_TABLET | Freq: Four times a day (QID) | ORAL | Status: DC | PRN
  Administered 2024-07-11 (×2): 650 mg via ORAL
  Filled 2024-07-10 (×2): qty 2

## 2024-07-10 MED ORDER — ACETAMINOPHEN 325 MG PO TABS
ORAL_TABLET | ORAL | Status: AC
  Filled 2024-07-10: qty 2

## 2024-07-10 MED ORDER — ACETAMINOPHEN 160 MG/5ML PO SOLN
650.0000 mg | Freq: Four times a day (QID) | ORAL | Status: DC | PRN
  Filled 2024-07-10: qty 20.3

## 2024-07-10 MED ORDER — INSULIN ASPART 100 UNIT/ML IJ SOLN
0.0000 [IU] | Freq: Three times a day (TID) | INTRAMUSCULAR | Status: DC
  Administered 2024-07-10: 1 [IU] via SUBCUTANEOUS
  Administered 2024-07-10: 3 [IU] via SUBCUTANEOUS
  Administered 2024-07-11 – 2024-07-12 (×3): 2 [IU] via SUBCUTANEOUS
  Filled 2024-07-10: qty 1
  Filled 2024-07-10: qty 2
  Filled 2024-07-10: qty 3
  Filled 2024-07-10 (×2): qty 2

## 2024-07-10 MED ORDER — SODIUM CHLORIDE 0.9 % IV SOLN: INTRAVENOUS | Status: AC | PRN

## 2024-07-10 MED ORDER — POTASSIUM CHLORIDE 10 MEQ/100ML IV SOLN
10.0000 meq | INTRAVENOUS | Status: AC
  Administered 2024-07-10 (×6): 10 meq via INTRAVENOUS
  Filled 2024-07-10 (×6): qty 100

## 2024-07-10 MED ORDER — SODIUM CHLORIDE 0.9 % IV BOLUS
500.0000 mL | Freq: Once | INTRAVENOUS | Status: AC
  Administered 2024-07-10: 500 mL via INTRAVENOUS

## 2024-07-10 MED ORDER — POTASSIUM CHLORIDE 10 MEQ/100ML IV SOLN: 10.0000 meq | INTRAVENOUS | Status: DC

## 2024-07-10 NOTE — Progress Notes (Signed)
 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 Code stroke called. On arrival to ED patient emergently intubated for acute respiratory distress and airway protection. CT Head with no ICH or large infarct, CTA head and neck with no LVO. CXR concerning for a Pneumonia with visible BL lung apices on CTA demonstrating infection/inflammation. Code stroke cancelled with presentation suspicious for sepsis.  Patient was admitted to the intensive care unit.    Consultants: Critical care medicine  Procedures: Intubation.    Subjective/Interval History: Patient lethargic but arousable.  Answers a few questions.  Her daughter is at the bedside.  Patient denies any chest pain.  No nausea.    Assessment/Plan:  Acute on chronic respiratory failure with hypoxia and hypercapnia/COPD with acute exacerbation/metapneumovirus pneumonia Possible superimposed bacterial pneumonia Patient was initially intubated.  She was extubated.  Currently on high flow nasal cannula with 20 L of oxygen  per minute/36% FiO2. Patient with low-grade fever overnight. It appears that she has completed course of antibiotics. Oxygen  requirements have improved in the last 48 hours. Will repeat chest x-ray.  Hypotension Low blood pressures noted this morning.  Noted to be on amlodipine  and ARB which were discontinued.  Will give IV fluid bolus.  Atrial fibrillation with RVR This is new onset. Looks like she was given amiodarone in the ICU. She converted to sinus rhythm.   Noted to be tachycardic.  Will repeat EKG. Not done or to be on any  anticoagulation. No recent TSH.  This will be ordered.   Echocardiogram was done which showed normal LVEF with grade 1 diastolic dysfunction.  Small pericardial effusion was noted.  Will order limited echocardiogram to confirm EF and to see if there is any progression of the pericardial effusion.  Hypokalemia Will be supplemented.  Magnesium .  Check phosphorus.  Macrocytic anemia No evidence of overt bleeding.  Check anemia panel if not done recently.  Ileus Resolved.  No nausea reported this morning.  Continue full liquid diet for now.  Encourage oral intake.  Dietitian to see.  Acute kidney injury Resolved.  DVT Prophylaxis: Lovenox  Code Status: Full code Family Communication: Discussed with patient and her daughter Disposition Plan: To be determined.  PT and OT eval    Medications: Scheduled:  arformoterol   15 mcg Nebulization BID   budesonide  (PULMICORT ) nebulizer solution  0.5 mg Nebulization BID   Chlorhexidine  Gluconate Cloth  6 each Topical Daily   enoxaparin  (LOVENOX ) injection  40 mg Subcutaneous Q24H   feeding supplement  1 Container Oral TID BM   insulin aspart  0-9 Units Subcutaneous Q4H   revefenacin  175 mcg Nebulization Daily   Continuous:  sodium chloride  Stopped (07/10/24 0755)   potassium chloride  10 mEq (07/10/24 0926)   sodium chloride      PRN:sodium chloride , acetaminophen , gabapentin, ipratropium-albuterol , labetalol , LORazepam , ondansetron  (ZOFRAN ) IV, mouth rinse, polyethylene glycol, simethicone  Antibiotics: Anti-infectives (From admission, onward)    Start     Dose/Rate Route Frequency Ordered Stop   07/08/24 1100  vancomycin (VANCOREADY) IVPB 750 mg/150 mL  Status:  Discontinued        750 mg 150 mL/hr over 60 Minutes Intravenous Every 24  hours 07/08/24 0951 07/08/24 1539   07/07/24 1000  vancomycin (VANCOREADY) IVPB 750 mg/150 mL  Status:  Discontinued        750 mg 150 mL/hr over 60 Minutes Intravenous Every 24 hours 07/06/24 0839  07/08/24 0951   07/06/24 0930  vancomycin (VANCOREADY) IVPB 1500 mg/300 mL        1,500 mg 150 mL/hr over 120 Minutes Intravenous  Once 07/06/24 0837 07/06/24 1327   07/06/24 0837  ceFEPIme (MAXIPIME) 2 g in sodium chloride  0.9 % 100 mL IVPB  Status:  Discontinued        2 g 200 mL/hr over 30 Minutes Intravenous Every 12 hours 07/06/24 0837 07/08/24 1539   06/30/24 1200  vancomycin (VANCOREADY) IVPB 1500 mg/300 mL  Status:  Discontinued        1,500 mg 150 mL/hr over 120 Minutes Intravenous Every 24 hours 06/29/24 1347 06/30/24 1332   06/29/24 1400  piperacillin-tazobactam (ZOSYN) IVPB 3.375 g        3.375 g 12.5 mL/hr over 240 Minutes Intravenous Every 8 hours 06/29/24 1238 07/06/24 0127   06/29/24 1330  vancomycin (VANCOREADY) IVPB 1500 mg/300 mL        1,500 mg 150 mL/hr over 120 Minutes Intravenous  Once 06/29/24 1238 06/29/24 1547   06/26/24 1015  cefTRIAXone  (ROCEPHIN ) 2 g in sodium chloride  0.9 % 100 mL IVPB        2 g 200 mL/hr over 30 Minutes Intravenous Every 24 hours 06/26/24 0929 06/29/24 1028   06/25/24 2100  ceFEPIme (MAXIPIME) 2 g in sodium chloride  0.9 % 100 mL IVPB  Status:  Discontinued        2 g 200 mL/hr over 30 Minutes Intravenous Every 12 hours 06/25/24 1259 06/26/24 0929   06/25/24 1330  azithromycin  (ZITHROMAX ) 500 mg in sodium chloride  0.9 % 250 mL IVPB        500 mg 250 mL/hr over 60 Minutes Intravenous Every 24 hours 06/25/24 1239 06/27/24 1059   06/25/24 0945  vancomycin (VANCOREADY) IVPB 1250 mg/250 mL  Status:  Discontinued        1,250 mg 166.7 mL/hr over 90 Minutes Intravenous  Once 06/25/24 0940 06/26/24 0907   06/25/24 0930  ceFEPIme (MAXIPIME) 2 g in sodium chloride  0.9 % 100 mL IVPB        2 g 200 mL/hr over 30 Minutes Intravenous  Once 06/25/24 0929 06/25/24 1043   06/25/24 0930  metroNIDAZOLE (FLAGYL) IVPB 500 mg        500 mg 100 mL/hr over 60 Minutes Intravenous  Once 06/25/24 0929 06/25/24 1202   06/25/24 0930  vancomycin (VANCOCIN) IVPB  1000 mg/200 mL premix  Status:  Discontinued        1,000 mg 200 mL/hr over 60 Minutes Intravenous  Once 06/25/24 0929 06/25/24 0940       Objective:  Vital Signs  Vitals:   07/10/24 0923 07/10/24 0924 07/10/24 0925 07/10/24 0926  BP: (!) 95/44  (!) 98/48   Pulse: (!) 113 (!) 115 (!) 112 (!) 113  Resp: 19 20 (!) 23 20  Temp:      TempSrc:      SpO2: 95% 95% 96% 95%  Weight:      Height:        Intake/Output Summary (Last 24 hours) at 07/10/2024 0933 Last data filed at 07/10/2024 0800 Gross per 24 hour  Intake 1194.58 ml  Output 1320 ml  Net -125.42 ml   Filed Weights   07/07/24  0500 07/08/24 0500 07/10/24 0540  Weight: 69.3 kg 66.8 kg 64.2 kg    General appearance:  In no distress.  Lethargic Resp: Poor effort noted.  No use of accessory muscles.  Diminished air entry in the bases with few crackles. Cardio: S1-S2 is normal regular.  No S3-S4.  No rubs murmurs or bruit GI: Abdomen is soft.  Nontender nondistended.  Bowel sounds are present normal.  No masses organomegaly Extremities: No edema.  Significant physical deconditioning noted. Neurologic: No focal neurological deficits.    Lab Results:  Data Reviewed: I have personally reviewed following labs and reports of the imaging studies  CBC: Recent Labs  Lab 07/05/24 0405 07/06/24 0515 07/07/24 1003 07/08/24 0533 07/09/24 0606  WBC 13.4* 17.2* 15.9* 12.6* 11.9*  HGB 8.4* 9.0* 9.3* 8.4* 9.2*  HCT 27.6* 28.8* 30.8* 27.9* 31.6*  MCV 94.5 92.6 95.7 97.2 100.3*  PLT 395 487* 434* 396 422*    Basic Metabolic Panel: Recent Labs  Lab 07/06/24 0515 07/07/24 1003 07/08/24 0533 07/08/24 1525 07/09/24 0606 07/10/24 0256  NA 139 141 143  --  143 141  K 3.5 3.4* 3.0*  --  4.3 3.3*  CL 87* 94* 99  --  102 98  CO2 37* 29 32  --  29 32  GLUCOSE 94 103* 91  --  94 111*  BUN 41* 34* 28*  --  29* 24*  CREATININE 1.18* 0.94 0.79  --  0.78 0.85  CALCIUM  9.9 9.5 9.2  --  9.1 9.0  MG 2.1  --   --  2.3  --   --      GFR: Estimated Creatinine Clearance: 49.1 mL/min (by C-G formula based on SCr of 0.85 mg/dL).  Liver Function Tests: Recent Labs  Lab 07/08/24 0918  AST 46*  ALT 50*  ALKPHOS 42  BILITOT 0.7  PROT 5.9*  ALBUMIN 2.3*    Recent Labs  Lab 07/08/24 0918 07/09/24 0606  LIPASE 165* 164*   BNP (last 3 results) Recent Labs    03/26/24 1105  PROBNP 142.0   CBG: Recent Labs  Lab 07/09/24 1532 07/09/24 1923 07/09/24 2317 07/10/24 0316 07/10/24 0738  GLUCAP 151* 76 175* 99 94    Lipid Profile: Recent Labs    07/08/24 0533 07/09/24 0606  TRIG 134 207*    Recent Results (from the past 240 hours)  Culture, Respiratory w Gram Stain     Status: None   Collection Time: 07/02/24 12:17 PM   Specimen: Tracheal Aspirate; Respiratory  Result Value Ref Range Status   Specimen Description TRACHEAL ASPIRATE  Final   Special Requests NONE  Final   Gram Stain   Final    FEW WBC PRESENT, PREDOMINANTLY PMN NO ORGANISMS SEEN Performed at [facility], [address] [place], [place] [address]    Culture   Final    RARE CANDIDA ALBICANS RARE FUNGUS (MOLD) ISOLATED, PROBABLE CONTAMINANT/COLONIZER (SAPROPHYTE). CONTACT MICROBIOLOGY IF FURTHER IDENTIFICATION REQUIRED [phone].    Report Status 07/05/2024 FINAL  Final  MRSA Next Gen by PCR, Nasal     Status: None   Collection Time: 07/06/24  8:16 AM   Specimen: Nasal Mucosa; Nasal Swab  Result Value Ref Range Status   MRSA by PCR Next Gen NOT DETECTED NOT DETECTED Final    Comment: (NOTE) The GeneXpert MRSA Assay (FDA approved for NASAL specimens only), is one component of a comprehensive MRSA colonization surveillance program. It is not intended to diagnose MRSA infection nor  to guide or monitor treatment for MRSA infections. Test performance is not FDA approved in patients less than 31 years old. Performed at [facility], [address], [place], [place] [address]   Culture, Respiratory w Gram Stain      Status: None   Collection Time: 07/06/24 11:35 AM   Specimen: Tracheal Aspirate; Respiratory  Result Value Ref Range Status   Specimen Description TRACHEAL ASPIRATE  Final   Special Requests NONE  Final   Gram Stain   Final    NO WBC SEEN RARE YEAST Performed at [facility], [address], [place], [place] [address]    Culture FEW CANDIDA ALBICANS  Final   Report Status 07/08/2024 FINAL  Final      Radiology Studies: No results found.     LOS: 15 days   Anusha Claus  [facility] Pager on [email or web address]  07/10/2024, 9:33 AM

## 2024-07-10 NOTE — Progress Notes (Signed)
 CPT not done at this time per family request due to wound on back.

## 2024-07-10 NOTE — Progress Notes (Signed)
 Patient's daughter asked me not to do her CPT at this time. She says she has a wound on her backside that hurts her during CPT.

## 2024-07-10 NOTE — Progress Notes (Signed)
 Physical Therapy Treatment Patient Details Name: Katie Waller MRN: [id number] DOB: Jan 25, 1946 Today's Date: 07/10/2024   History of Present Illness 78 yo female found unresponsive in home. Pt found to be hypotensive, presumed sepsis intubated 10/21 extubated 10/24, re-intubated 10/25 then extubated 07/07/24. PMH HTN COPID chronic hypoxic 5-6 L [place] baseline, HF, SCC L lower lobe s/p radiation in 05/2024.    PT Comments  Treatment limited to bed level therex due to pt feeling poorly daughter reports has a fever.  Agreeable to LE strengthening and performed as below.  Printed for HEP and issued with instructions to her daughter to assist her as able.  Patient remains appropriate for inpatient rehab (<3 hours/day) versus LTACH at d/c.    If plan is discharge home, recommend the following: Two people to help with bathing/dressing/bathroom;Two people to help with walking and/or transfers;Help with stairs or ramp for entrance;Assist for transportation   Can travel by private vehicle     No  Equipment Recommendations  Other (comment) (TBA)    Recommendations for Other Services       Precautions / Restrictions Precautions Precautions: Fall Precaution/Restrictions Comments: watch 02 BP     Mobility  Bed Mobility               General bed mobility comments: NT in bed therex only    Transfers                        Ambulation/Gait                   Stairs             Wheelchair Mobility     Tilt Bed    Modified Rankin (Stroke Patients Only)       Balance                                            Communication Communication Communication: Impaired Factors Affecting Communication: Reduced clarity of speech  Cognition Arousal: Alert Behavior During Therapy: Flat affect                             Following commands: Intact      Cueing Cueing Techniques: Verbal cues, Visual cues  Exercises General  Exercises - Lower Extremity Ankle Circles/Pumps: AAROM, 10 reps, Supine, Both Gluteal Sets: AROM, Both, 5 reps, Supine Short Arc Quad: AAROM, Both, 5 reps, Supine Heel Slides: AAROM, Both, 5 reps, Supine (with resistance to extension) Hip ABduction/ADduction: AROM, Both, 5 reps, Supine (adductor squeezes w/ 5 sec hold)    General Comments        Pertinent Vitals/Pain Pain Assessment Pain Assessment: No/denies pain    Home Living                          Prior Function            PT Goals (current goals can now be found in the care plan section) Progress towards PT goals: Not progressing toward goals - comment    Frequency    Min 2X/week      PT Plan      Co-evaluation              AM-PAC PT 6 Clicks Mobility   Outcome  Measure  Help needed turning from your back to your side while in a flat bed without using bedrails?: Total Help needed moving from lying on your back to sitting on the side of a flat bed without using bedrails?: Total Help needed moving to and from a bed to a chair (including a wheelchair)?: Total Help needed standing up from a chair using your arms (e.g., wheelchair or bedside chair)?: Total Help needed to walk in hospital room?: Total Help needed climbing 3-5 steps with a railing? : Total 6 Click Score: 6    End of Session Equipment Utilized During Treatment: Oxygen  Activity Tolerance: Patient limited by fatigue Patient left: in bed;with call bell/phone within reach;with family/visitor present   PT Visit Diagnosis: Other abnormalities of gait and mobility (R26.89);Muscle weakness (generalized) (M62.81)     Time: 1115-1140 PT Time Calculation (min) (ACUTE ONLY): 25 min  Charges:    $Therapeutic Exercise: 8-22 mins $Self Care/Home Management: 8-22 PT General Charges $$ ACUTE PT VISIT: 1 Visit                     Micheline Portal, PT Acute Rehabilitation Services [phone] 07/10/2024    Montie Portal 07/10/2024, 12:25 PM

## 2024-07-10 NOTE — Progress Notes (Signed)
 [facility] ADULT ICU REPLACEMENT PROTOCOL   The patient does apply for the [facility] Adult ICU Electrolyte Replacment Protocol based on the criteria listed below:   1.Exclusion criteria: TCTS, ECMO, Dialysis, and Myasthenia Gravis patients 2. Is GFR >/= 30 ml/min? Yes.    Patient's GFR today is >60 3. Is SCr </= 2? Yes.   Patient's SCr is 0.85 mg/dL 4. Did SCr increase >/= 0.5 in 24 hours? No. 5.Pt's weight >40kg  Yes.   6. Abnormal electrolyte(s): K  7. Electrolytes replaced per protocol 8.  Call MD STAT for K+ </= 2.5, Phos </= 1, or Mag </= 1 Physician:  Haze Hunter BRAVO Charleene Callegari 07/10/2024 3:48 AM

## 2024-07-10 NOTE — Progress Notes (Signed)
  Echocardiogram 2D Echocardiogram has been performed.  Koleen KANDICE Popper, RDCS 07/10/2024, 2:15 PM

## 2024-07-10 NOTE — Consult Note (Signed)
 WOC Nurse Consult Note:  WOC consult performed remotely utilizing imaging and chart review Reason for Consult: Stage II and new DTI identified  Wound type:  Deep tissue pressure injury to sacrococcygeal area, evolving in the inferior aspect of wound to reveal red, moist clean tissue Pressure Injury POA: No Measurement: see nursing flow sheets Wound bed: deep purple maroon discoloration to superior aspect of wound with red, moist, clean tissue in inferior aspect of wound Drainage (amount, consistency, odor) see nursing flow sheets Periwound: intact Dressing procedure/placement/frequency:   Sacrococcygeal wound: Cleanse with NS, pat dry.  Place Xeroform over wound bed and cover with silicone foam dressing.  Change daily.    WOC Nurse team will follow with you and see patient within 10 days for wound assessments.  Please notify WOC nurses of any acute changes in the wounds or any new areas of concern  Thank you,  Doyal Polite, MSN, RN, [facility] WOC Team [phone] (Available Mon-Fri 0700-1500)

## 2024-07-11 ENCOUNTER — Inpatient Hospital Stay (HOSPITAL_COMMUNITY)

## 2024-07-11 DIAGNOSIS — I639 Cerebral infarction, unspecified: Secondary | ICD-10-CM

## 2024-07-11 DIAGNOSIS — J9622 Acute and chronic respiratory failure with hypercapnia: Secondary | ICD-10-CM | POA: Diagnosis not present

## 2024-07-11 DIAGNOSIS — J9601 Acute respiratory failure with hypoxia: Secondary | ICD-10-CM

## 2024-07-11 DIAGNOSIS — J9621 Acute and chronic respiratory failure with hypoxia: Secondary | ICD-10-CM | POA: Diagnosis not present

## 2024-07-11 DIAGNOSIS — M7989 Other specified soft tissue disorders: Secondary | ICD-10-CM | POA: Diagnosis not present

## 2024-07-11 DIAGNOSIS — R609 Edema, unspecified: Secondary | ICD-10-CM

## 2024-07-11 DIAGNOSIS — R509 Fever, unspecified: Secondary | ICD-10-CM | POA: Diagnosis not present

## 2024-07-11 DIAGNOSIS — I480 Paroxysmal atrial fibrillation: Secondary | ICD-10-CM | POA: Diagnosis not present

## 2024-07-11 LAB — URINALYSIS, ROUTINE W REFLEX MICROSCOPIC
Bacteria, UA: NONE SEEN
Bilirubin Urine: NEGATIVE
Glucose, UA: NEGATIVE mg/dL
Ketones, ur: NEGATIVE mg/dL
Leukocytes,Ua: NEGATIVE
Nitrite: NEGATIVE
Protein, ur: 100 mg/dL — AB
Specific Gravity, Urine: 1.014 (ref 1.005–1.030)
pH: 5 (ref 5.0–8.0)

## 2024-07-11 LAB — BASIC METABOLIC PANEL WITH GFR
Anion gap: 10 (ref 5–15)
BUN: 15 mg/dL (ref 8–23)
CO2: 30 mmol/L (ref 22–32)
Calcium: 8.8 mg/dL — ABNORMAL LOW (ref 8.9–10.3)
Chloride: 97 mmol/L — ABNORMAL LOW (ref 98–111)
Creatinine, Ser: 0.65 mg/dL (ref 0.44–1.00)
GFR, Estimated: >60 mL/min (ref >60)
Glucose, Bld: 100 mg/dL — ABNORMAL HIGH (ref 70–99)
Potassium: 4.1 mmol/L (ref 3.5–5.1)
Sodium: 137 mmol/L (ref 135–145)

## 2024-07-11 LAB — PHOSPHORUS: Phosphorus: 2 mg/dL — ABNORMAL LOW (ref 2.5–4.6)

## 2024-07-11 LAB — FOLATE: Folate: >20 ng/mL (ref >5.9)

## 2024-07-11 LAB — RETICULOCYTES
Immature Retic Fract: 5.6 % (ref 2.3–15.9)
RBC.: 3.05 MIL/uL — ABNORMAL LOW (ref 3.87–5.11)
Retic Count, Absolute: 51.2 K/uL (ref 19.0–186.0)
Retic Ct Pct: 1.7 % (ref 0.4–3.1)

## 2024-07-11 LAB — IRON AND TIBC
Iron: 44 ug/dL (ref 28–170)
Saturation Ratios: 19 % (ref 10.4–31.8)
TIBC: 227 ug/dL — ABNORMAL LOW (ref 250–450)
UIBC: 183 ug/dL

## 2024-07-11 LAB — CBC
HCT: 30.5 % — ABNORMAL LOW (ref 36.0–46.0)
Hemoglobin: 8.9 g/dL — ABNORMAL LOW (ref 12.0–15.0)
MCH: 29.2 pg (ref 26.0–34.0)
MCHC: 29.2 g/dL — ABNORMAL LOW (ref 30.0–36.0)
MCV: 100 fL (ref 80.0–100.0)
Platelets: 416 K/uL — ABNORMAL HIGH (ref 150–400)
RBC: 3.05 MIL/uL — ABNORMAL LOW (ref 3.87–5.11)
RDW: 13.7 % (ref 11.5–15.5)
WBC: 11 K/uL — ABNORMAL HIGH (ref 4.0–10.5)
nRBC: 0 % (ref 0.0–0.2)

## 2024-07-11 LAB — GLUCOSE, CAPILLARY
Glucose-Capillary: 98 mg/dL (ref 70–99)
Glucose-Capillary: 194 mg/dL — ABNORMAL HIGH (ref 70–99)
Glucose-Capillary: 111 mg/dL — ABNORMAL HIGH (ref 70–99)
Glucose-Capillary: 111 mg/dL — ABNORMAL HIGH (ref 70–99)

## 2024-07-11 LAB — FERRITIN: Ferritin: 762 ng/mL — ABNORMAL HIGH (ref 11–307)

## 2024-07-11 LAB — MAGNESIUM: Magnesium: 2.1 mg/dL (ref 1.7–2.4)

## 2024-07-11 LAB — VITAMIN B12: Vitamin B-12: 1029 pg/mL — ABNORMAL HIGH (ref 180–914)

## 2024-07-11 LAB — LACTIC ACID, PLASMA: Lactic Acid, Venous: 0.7 mmol/L (ref 0.5–1.9)

## 2024-07-11 LAB — PROCALCITONIN: Procalcitonin: 0.28 ng/mL

## 2024-07-11 LAB — TSH: TSH: 1.105 u[IU]/mL (ref 0.350–4.500)

## 2024-07-11 MED ORDER — GABAPENTIN 300 MG PO CAPS
300.0000 mg | ORAL_CAPSULE | Freq: Every evening | ORAL | Status: DC | PRN
  Filled 2024-07-11: qty 1

## 2024-07-11 MED ORDER — VANCOMYCIN HCL IN DEXTROSE 1-5 GM/200ML-% IV SOLN
1000.0000 mg | INTRAVENOUS | Status: DC
  Administered 2024-07-11 – 2024-07-14 (×4): 1000 mg via INTRAVENOUS
  Filled 2024-07-11 (×4): qty 200

## 2024-07-11 MED ORDER — ENSURE PLUS HIGH PROTEIN PO LIQD: 237.0000 mL | Freq: Two times a day (BID) | ORAL | Status: DC

## 2024-07-11 MED ORDER — OXYCODONE HCL 5 MG PO TABS
2.5000 mg | ORAL_TABLET | Freq: Four times a day (QID) | ORAL | Status: DC | PRN
  Administered 2024-07-11 (×2): 2.5 mg via ORAL
  Filled 2024-07-11 (×2): qty 1

## 2024-07-11 MED ORDER — OSMOLITE 1.2 CAL PO LIQD
1000.0000 mL | ORAL | Status: DC
  Filled 2024-07-11: qty 1000

## 2024-07-11 MED ORDER — SODIUM CHLORIDE 0.9 % IV SOLN
2.0000 g | Freq: Two times a day (BID) | INTRAVENOUS | Status: DC
  Administered 2024-07-11 – 2024-07-13 (×5): 2 g via INTRAVENOUS
  Filled 2024-07-11 (×5): qty 12.5

## 2024-07-11 MED ORDER — K PHOS MONO-SOD PHOS DI & MONO 155-852-130 MG PO TABS
500.0000 mg | ORAL_TABLET | Freq: Three times a day (TID) | ORAL | Status: AC
  Administered 2024-07-11 (×3): 500 mg via ORAL
  Filled 2024-07-11 (×3): qty 2

## 2024-07-11 NOTE — Progress Notes (Signed)
 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 Code stroke called. On arrival to ED patient emergently intubated for acute respiratory distress and airway protection. CT Head with no ICH or large infarct, CTA head and neck with no LVO. CXR concerning for a Pneumonia with visible BL lung apices on CTA demonstrating infection/inflammation. Code stroke cancelled with presentation suspicious for sepsis.  Patient was admitted to the intensive care unit.    Consultants: Critical care medicine  Procedures: Intubation.    Subjective/Interval History: Patient lethargic but arousable.  Answers a few questions.  Her daughter is at the bedside.  Patient denies any chest pain.  No nausea.    Assessment/Plan:  Acute on chronic respiratory failure with hypoxia and hypercapnia/COPD with acute exacerbation/metapneumovirus pneumonia Superimposed bacterial pneumonia Patient was initially intubated.  She was extubated.  Currently on high flow nasal cannula with 20 L of oxygen  per minute/36% FiO2. Patient recently completed course of antibiotics.  Initially on vancomycin and Zosyn and then on vancomycin and cefepime. Patient again spiked fever.  Last dose of antibiotics was on 11/3. WBC noted to be elevated.  Will check lactic acid, procalcitonin, blood cultures.  Repeat chest x-ray.  Check UA. Resume vancomycin and cefepime. Check lower extremity Dopplers to rule out DVT. Continues to have high oxygen  requirements.  Hypotension Had low blood pressures yesterday which have improved.  Amlodipine  and ARB currently on hold.  May need  to use as needed medications.  Atrial fibrillation with RVR This is new onset. Looks like she was given amiodarone in the ICU. She converted to sinus rhythm.  Currently has sinus tachycardia likely secondary to fever. TSH noted to be normal.  Echocardiogram shows normal LVEF.  Small pericardial effusion noted.  Echo was repeated and shows no worsening in pericardial effusion.  Hypokalemia/hypophosphatemia Supplemented.  Will supplement phosphorus.  Magnesium  is 2.1.    Macrocytic anemia No evidence of overt bleeding.  Anemia panel shows ferritin of 762, iron 44, TIBC 227, percent saturation 19.  B12 1029 and folic acid greater than 20.    Ileus Resolved.    Acute kidney injury Resolved.  DVT Prophylaxis: Lovenox  Code Status: Full code Family Communication: Discussed with patient and her daughter Disposition Plan: SNF when medically stable    Medications: Scheduled:  acetaminophen        arformoterol   15 mcg Nebulization BID   budesonide  (PULMICORT ) nebulizer solution  0.5 mg Nebulization BID   Chlorhexidine  Gluconate Cloth  6 each Topical Daily   enoxaparin  (LOVENOX ) injection  40 mg Subcutaneous Q24H   feeding supplement  1 Container Oral TID BM   insulin aspart  0-9 Units Subcutaneous TID WC   phosphorus  500 mg Oral TID   revefenacin  175 mcg Nebulization Daily   Continuous:  ceFEPime (MAXIPIME) IV     vancomycin     PRN:acetaminophen , acetaminophen , gabapentin, ipratropium-albuterol , labetalol , LORazepam , ondansetron  (ZOFRAN ) IV, mouth rinse, oxyCODONE, polyethylene glycol, simethicone  Antibiotics: Anti-infectives (From admission, onward)    Start     Dose/Rate Route Frequency Ordered Stop   07/11/24 0945  vancomycin (VANCOCIN) IVPB 1000 mg/200 mL premix  1,000 mg 200 mL/hr over 60 Minutes Intravenous Every 24 hours 07/11/24 0854     07/11/24 0945  ceFEPIme (MAXIPIME) 2 g in sodium chloride  0.9 % 100 mL IVPB        2 g 200 mL/hr over 30 Minutes  Intravenous Every 12 hours 07/11/24 0854     07/08/24 1100  vancomycin (VANCOREADY) IVPB 750 mg/150 mL  Status:  Discontinued        750 mg 150 mL/hr over 60 Minutes Intravenous Every 24 hours 07/08/24 0951 07/08/24 1539   07/07/24 1000  vancomycin (VANCOREADY) IVPB 750 mg/150 mL  Status:  Discontinued        750 mg 150 mL/hr over 60 Minutes Intravenous Every 24 hours 07/06/24 0839 07/08/24 0951   07/06/24 0930  vancomycin (VANCOREADY) IVPB 1500 mg/300 mL        1,500 mg 150 mL/hr over 120 Minutes Intravenous  Once 07/06/24 0837 07/06/24 1327   07/06/24 0837  ceFEPIme (MAXIPIME) 2 g in sodium chloride  0.9 % 100 mL IVPB  Status:  Discontinued        2 g 200 mL/hr over 30 Minutes Intravenous Every 12 hours 07/06/24 0837 07/08/24 1539   06/30/24 1200  vancomycin (VANCOREADY) IVPB 1500 mg/300 mL  Status:  Discontinued        1,500 mg 150 mL/hr over 120 Minutes Intravenous Every 24 hours 06/29/24 1347 06/30/24 1332   06/29/24 1400  piperacillin-tazobactam (ZOSYN) IVPB 3.375 g        3.375 g 12.5 mL/hr over 240 Minutes Intravenous Every 8 hours 06/29/24 1238 07/06/24 0127   06/29/24 1330  vancomycin (VANCOREADY) IVPB 1500 mg/300 mL        1,500 mg 150 mL/hr over 120 Minutes Intravenous  Once 06/29/24 1238 06/29/24 1547   06/26/24 1015  cefTRIAXone  (ROCEPHIN ) 2 g in sodium chloride  0.9 % 100 mL IVPB        2 g 200 mL/hr over 30 Minutes Intravenous Every 24 hours 06/26/24 0929 06/29/24 1028   06/25/24 2100  ceFEPIme (MAXIPIME) 2 g in sodium chloride  0.9 % 100 mL IVPB  Status:  Discontinued        2 g 200 mL/hr over 30 Minutes Intravenous Every 12 hours 06/25/24 1259 06/26/24 0929   06/25/24 1330  azithromycin  (ZITHROMAX ) 500 mg in sodium chloride  0.9 % 250 mL IVPB        500 mg 250 mL/hr over 60 Minutes Intravenous Every 24 hours 06/25/24 1239 06/27/24 1059   06/25/24 0945  vancomycin (VANCOREADY) IVPB 1250 mg/250 mL  Status:  Discontinued        1,250 mg 166.7 mL/hr over 90 Minutes  Intravenous  Once 06/25/24 0940 06/26/24 0907   06/25/24 0930  ceFEPIme (MAXIPIME) 2 g in sodium chloride  0.9 % 100 mL IVPB        2 g 200 mL/hr over 30 Minutes Intravenous  Once 06/25/24 0929 06/25/24 1043   06/25/24 0930  metroNIDAZOLE (FLAGYL) IVPB 500 mg        500 mg 100 mL/hr over 60 Minutes Intravenous  Once 06/25/24 0929 06/25/24 1202   06/25/24 0930  vancomycin (VANCOCIN) IVPB 1000 mg/200 mL premix  Status:  Discontinued        1,000 mg 200 mL/hr over 60 Minutes Intravenous  Once 06/25/24 0929 06/25/24 0940       Objective:  Vital Signs  Vitals:   07/11/24 0321 07/11/24 0400 07/11/24 0700 07/11/24 0834  BP:  (!) 147/57 (!) 149/81 (!) 176/63  Pulse: (!) 107 (!) 101 100 (!) 120  Resp: (!) 22 20 20 17   Temp:  98.7 F (37.1 C) (!) 101.8 F (38.8 C) (!) 101.1 F (38.4 C)  TempSrc:  Axillary Axillary Axillary  SpO2: 96% 90% 93% 95%  Weight:      Height:        Intake/Output Summary (Last 24 hours) at 07/11/2024 0914 Last data filed at 07/10/2024 2359 Gross per 24 hour  Intake 854.9 ml  Output 700 ml  Net 154.9 ml   Filed Weights   07/07/24 0500 07/08/24 0500 07/10/24 0540  Weight: 69.3 kg 66.8 kg 64.2 kg    General appearance: Awake but lethargic.  Follows commands. Resp: Effort noted.  Diminished air entry at the bases.  No definite wheezing or rhonchi. Cardio: S1-S2 is tachycardic regular.  No S3-S4.  No rubs murmurs or bruit GI: Abdomen is soft.  Nontender nondistended.  Bowel sounds are present normal.  No masses organomegaly Extremities: Nephric and physical deconditioning noted though she is moving all 4 extremities Neurologic:  No focal neurological deficits.     Lab Results:  Data Reviewed: I have personally reviewed following labs and reports of the imaging studies  CBC: Recent Labs  Lab 07/06/24 0515 07/07/24 1003 07/08/24 0533 07/09/24 0606 07/11/24 0228  WBC 17.2* 15.9* 12.6* 11.9* 11.0*  HGB 9.0* 9.3* 8.4* 9.2* 8.9*  HCT 28.8* 30.8*  27.9* 31.6* 30.5*  MCV 92.6 95.7 97.2 100.3* 100.0  PLT 487* 434* 396 422* 416*    Basic Metabolic Panel: Recent Labs  Lab 07/06/24 0515 07/07/24 1003 07/08/24 0533 07/08/24 1525 07/09/24 0606 07/10/24 0256 07/11/24 0228  NA 139 141 143  --  143 141 137  K 3.5 3.4* 3.0*  --  4.3 3.3* 4.1  CL 87* 94* 99  --  102 98 97*  CO2 37* 29 32  --  29 32 30  GLUCOSE 94 103* 91  --  94 111* 100*  BUN 41* 34* 28*  --  29* 24* 15  CREATININE 1.18* 0.94 0.79  --  0.78 0.85 0.65  CALCIUM  9.9 9.5 9.2  --  9.1 9.0 8.8*  MG 2.1  --   --  2.3  --  2.1 2.1  PHOS  --   --   --   --   --   --  2.0*    GFR: Estimated Creatinine Clearance: 52.2 mL/min (by C-G formula based on SCr of 0.65 mg/dL).  Liver Function Tests: Recent Labs  Lab 07/08/24 0918  AST 46*  ALT 50*  ALKPHOS 42  BILITOT 0.7  PROT 5.9*  ALBUMIN 2.3*    Recent Labs  Lab 07/08/24 0918 07/09/24 0606  LIPASE 165* 164*   BNP (last 3 results) Recent Labs    03/26/24 1105  PROBNP 142.0   CBG: Recent Labs  Lab 07/10/24 0738 07/10/24 1201 07/10/24 1530 07/10/24 2129 07/11/24 0629  GLUCAP 94 241* 126* 112* 98    Lipid Profile: Recent Labs    07/09/24 0606  TRIG 207*    Recent Results (from the past 240 hours)  Culture, Respiratory w Gram Stain     Status: None   Collection Time: 07/02/24 12:17 PM   Specimen: Tracheal Aspirate; Respiratory  Result Value Ref Range Status   Specimen Description TRACHEAL ASPIRATE  Final   Special Requests NONE  Final   Gram Stain   Final    FEW WBC PRESENT, PREDOMINANTLY PMN NO ORGANISMS SEEN Performed at [facility]  [facility], [address], [place], [place] [address]    Culture   Final    RARE CANDIDA ALBICANS RARE FUNGUS (MOLD) ISOLATED, PROBABLE CONTAMINANT/COLONIZER (SAPROPHYTE). CONTACT MICROBIOLOGY IF FURTHER IDENTIFICATION REQUIRED [phone].    Report Status 07/05/2024 FINAL  Final  MRSA Next Gen by PCR, Nasal     Status: None   Collection Time: 07/06/24   8:16 AM   Specimen: Nasal Mucosa; Nasal Swab  Result Value Ref Range Status   MRSA by PCR Next Gen NOT DETECTED NOT DETECTED Final    Comment: (NOTE) The GeneXpert MRSA Assay (FDA approved for NASAL specimens only), is one component of a comprehensive MRSA colonization surveillance program. It is not intended to diagnose MRSA infection nor to guide or monitor treatment for MRSA infections. Test performance is not FDA approved in patients less than 31 years old. Performed at [facility], [address] [place], [place] [address]   Culture, Respiratory w Gram Stain     Status: None   Collection Time: 07/06/24 11:35 AM   Specimen: Tracheal Aspirate; Respiratory  Result Value Ref Range Status   Specimen Description TRACHEAL ASPIRATE  Final   Special Requests NONE  Final   Gram Stain   Final    NO WBC SEEN RARE YEAST Performed at [facility], [address] [place], [place] [address]    Culture FEW CANDIDA ALBICANS  Final   Report Status 07/08/2024 FINAL  Final      Radiology Studies: ECHOCARDIOGRAM LIMITED Result Date: 07/10/2024    ECHOCARDIOGRAM LIMITED REPORT   Patient Name:   Katie Waller Date of Exam: 07/10/2024 Medical Rec #:  [id number]        Height:       61.0 in Accession #:    [id number]       Weight:       141.5 lb Date of Birth:  Jun 01, 1946       BSA:          1.631 m Patient Age:    77 years         BP:           156/78 mmHg Patient Gender: F                HR:           118 bpm. Exam Location:  Inpatient Procedure: 2D Echo, Cardiac Doppler and Color Doppler (Both Spectral and Color            Flow Doppler were utilized during procedure). Indications:    Atrail Fibrillation                 Pericardial Effusion  History:        Patient has prior history of Echocardiogram examinations, most                 recent 07/01/2024. Arrythmias:Atrial Fibrillation.  Sonographer:    Koleen Popper RDCS Referring Phys: [phone] [facility]  Sonographer Comments: Image  acquisition challenging due to respiratory motion. IMPRESSIONS  1. Left ventricular ejection fraction, by estimation, is 65 to 70%. The left ventricle has normal function. Left ventricular endocardial border not optimally defined to evaluate regional wall motion. There is mild left ventricular hypertrophy. Left ventricular diastolic parameters are consistent with Grade I diastolic dysfunction (impaired relaxation).  2. Right ventricular systolic function is normal. The right ventricular size is normal.  3. A small pericardial effusion is present. There is  no evidence of cardiac tamponade.  4. Mild mitral valve regurgitation. FINDINGS  Left Ventricle: Left ventricular ejection fraction, by estimation, is 65 to 70%. The left ventricle has normal function. Left ventricular endocardial border not optimally defined to evaluate regional wall motion. There is mild left ventricular hypertrophy. Left ventricular diastolic parameters are consistent with Grade I diastolic dysfunction (impaired relaxation). Left ventricular diastolic function could not be evaluated due to atrial fibrillation. Normal left ventricular filling pressure. Right Ventricle: The right ventricular size is normal. No increase in right ventricular wall thickness. Right ventricular systolic function is normal. Left Atrium: Left atrial size was normal in size. Right Atrium: Right atrial size was normal in size. Pericardium: A small pericardial effusion is present. There is no evidence of cardiac tamponade. Mitral Valve: Mild mitral valve regurgitation. Tricuspid Valve: Tricuspid valve regurgitation is mild. Aortic Valve: Aortic valve peak gradient measures 12.7 mmHg. Additional Comments: Spectral Doppler performed. Color Doppler performed.  LEFT VENTRICLE PLAX 2D LVIDd:         4.80 cm Diastology LVIDs:         2.90 cm LV e' medial:    7.65 cm/s LV PW:         1.20 cm LV E/e' medial:  9.4 LV IVS:        0.90 cm LV e' lateral:   8.21 cm/s                         LV E/e' lateral: 8.7  RIGHT VENTRICLE         IVC TAPSE (M-mode): 1.7 cm  IVC diam: 2.10 cm LEFT ATRIUM         Index LA diam:    3.90 cm 2.39 cm/m  AORTIC VALVE AV Vmax:      178.00 cm/s AV Peak Grad: 12.7 mmHg LVOT Vmax:    166.00 cm/s LVOT Vmean:   114.000 cm/s LVOT VTI:     0.240 m  AORTA Ao Asc diam: 3.60 cm MITRAL VALVE MV Area (PHT): 4.49 cm     SHUNTS MV Decel Time: 169 msec     Systemic VTI: 0.24 m MV E velocity: 71.80 cm/s MV A velocity: 104.00 cm/s MV E/A ratio:  0.69 Annabella Scarce MD Electronically signed by Annabella Scarce MD Signature Date/Time: 07/10/2024/5:15:41 PM    Final        LOS: 16 days   Joette Pebbles  [facility] Pager on [email or web address]  07/11/2024, 9:14 AM

## 2024-07-11 NOTE — Progress Notes (Signed)
 Pharmacy Antibiotic Note  MEDEA DEINES is a 78 y.o. female admitted on 06/25/2024 with sepsis.  Pharmacy has been consulted for vanc/cefepime dosing.  Pt was recently treated for PNA. She develops fever again so vanc/cefepime restarted empirically.   Scr<1  Plan: Vanc 1g IV q24>>AUC 511, scr 0.8 Cefepime 2g IV q12 F/u with levels  Height: 5' 1 (154.9 cm) Weight: 64.2 kg (141 lb 8.6 oz) IBW/kg (Calculated) : 47.8  Temp (24hrs), Avg:100.2 F (37.9 C), Min:98.7 F (37.1 C), Max:101.8 F (38.8 C)  Recent Labs  Lab 07/06/24 0515 07/07/24 1003 07/08/24 0533 07/09/24 0606 07/10/24 0256 07/11/24 0228  WBC 17.2* 15.9* 12.6* 11.9*  --  11.0*  CREATININE 1.18* 0.94 0.79 0.78 0.85 0.65    Estimated Creatinine Clearance: 52.2 mL/min (by C-G formula based on SCr of 0.65 mg/dL).    Allergies  Allergen Reactions   Ace Inhibitors Swelling   Diphenhydramine Other (See Comments)    Hyperactive/jittery    Doxycycline Hyclate Nausea And Vomiting   Codeine Nausea And Vomiting    Antimicrobials this admission: Zosyn 10/26 >>10/31 CRO 10/22>10/24 Cefepime 10/21; 11/1> 11/3 11/6>> Azithro 10/21>10/23 Flagyl 10/21 x1 Vanc 10/21 x1; 11/2> 11/3 11/6>>  Dose adjustments this admission:   Microbiology results: 11/6 blood>> 11/1 MRSA PCR > neg 11/1 Resp Cx >few candida albicans 10/26 tracheal aspirate: rare fungus (mold), likely contaminant; contact micro if further ID needed 10/24 Bcx: contaminant (staph epi, staph cap) 10/22: metapneumovirus (+) 10/22: legionella (-) 10/21 BCX: BCID staph 10/21 MRSA (-) 10/21 Resp panel (-)  Sergio Batch, PharmD, BCIDP, AAHIVP, CPP Infectious Disease Pharmacist 07/11/2024 8:57 AM

## 2024-07-11 NOTE — TOC Progression Note (Signed)
 Transition of Care [facility]) - Progression Note    Patient Details  Name: Katie Waller MRN: [id number] Date of Birth: 01-10-1946  Transition of [facility]) CM/SW Contact  Lauraine FORBES Saa, LCSWA Phone Number: 07/11/2024, 12:09 PM  Clinical Narrative:     12:10 PM Per progressions, patient is anticipated to receive a cortrak today and patient remains on HFNC. CSW will continue to follow.  Expected Discharge Plan: Skilled Nursing Facility Barriers to Discharge: Continued Medical Work up, English As A Second Language Teacher, SNF Pending bed offer               Expected Discharge Plan and Services In-house Referral: Clinical Social Work   Post Acute Care Choice: Skilled Nursing Facility Living arrangements for the past 2 months: Single Family Home                                       Social Drivers of Health (SDOH) Interventions SDOH Screenings   Food Insecurity: No Food Insecurity (07/04/2024)  Housing: Low Risk  (07/04/2024)  Transportation Needs: No Transportation Needs (07/04/2024)  Utilities: Not At Risk (07/04/2024)  Depression (PHQ2-9): Low Risk  (06/05/2023)  Social Connections: Patient Unable To Answer (07/09/2024)  Tobacco Use: Medium Risk (06/25/2024)    Readmission Risk Interventions     No data to display

## 2024-07-11 NOTE — Plan of Care (Signed)
  Problem: Clinical Measurements: Goal: Will remain free from infection Outcome: Progressing   Problem: Pain Managment: Goal: General experience of comfort will improve and/or be controlled Outcome: Progressing   Problem: Safety: Goal: Ability to remain free from injury will improve Outcome: Progressing   Problem: Skin Integrity: Goal: Risk for impaired skin integrity will decrease Outcome: Progressing

## 2024-07-11 NOTE — Plan of Care (Signed)
  Problem: Health Behavior/Discharge Planning: Goal: Ability to manage health-related needs will improve Outcome: Not Progressing   Problem: Clinical Measurements: Goal: Ability to maintain clinical measurements within normal limits will improve Outcome: Not Progressing Goal: Will remain free from infection Outcome: Not Progressing Goal: Diagnostic test results will improve Outcome: Not Progressing Goal: Respiratory complications will improve Outcome: Not Progressing Goal: Cardiovascular complication will be avoided Outcome: Not Progressing   Problem: Activity: Goal: Risk for activity intolerance will decrease Outcome: Not Progressing   Problem: Nutrition: Goal: Adequate nutrition will be maintained Outcome: Not Progressing   Problem: Coping: Goal: Level of anxiety will decrease Outcome: Not Progressing   Problem: Elimination: Goal: Will not experience complications related to bowel motility Outcome: Not Progressing Goal: Will not experience complications related to urinary retention Outcome: Not Progressing   Problem: Pain Managment: Goal: General experience of comfort will improve and/or be controlled Outcome: Not Progressing   Problem: Safety: Goal: Ability to remain free from injury will improve Outcome: Not Progressing   Problem: Skin Integrity: Goal: Risk for impaired skin integrity will decrease Outcome: Not Progressing   Problem: Education: Goal: Ability to describe self-care measures that may prevent or decrease complications (Diabetes Survival Skills Education) will improve Outcome: Not Progressing Goal: Individualized Educational Video(s) Outcome: Not Progressing   Problem: Coping: Goal: Ability to adjust to condition or change in health will improve Outcome: Not Progressing   Problem: Fluid Volume: Goal: Ability to maintain a balanced intake and output will improve Outcome: Not Progressing   Problem: Health Behavior/Discharge Planning: Goal:  Ability to identify and utilize available resources and services will improve Outcome: Not Progressing Goal: Ability to manage health-related needs will improve Outcome: Not Progressing   Problem: Metabolic: Goal: Ability to maintain appropriate glucose levels will improve Outcome: Not Progressing   Problem: Nutritional: Goal: Maintenance of adequate nutrition will improve Outcome: Not Progressing Goal: Progress toward achieving an optimal weight will improve Outcome: Not Progressing   Problem: Skin Integrity: Goal: Risk for impaired skin integrity will decrease Outcome: Not Progressing   Problem: Tissue Perfusion: Goal: Adequacy of tissue perfusion will improve Outcome: Not Progressing   Problem: Fluid Volume: Goal: Hemodynamic stability will improve Outcome: Not Progressing   Problem: Clinical Measurements: Goal: Diagnostic test results will improve Outcome: Not Progressing Goal: Signs and symptoms of infection will decrease Outcome: Not Progressing   Problem: Respiratory: Goal: Ability to maintain adequate ventilation will improve Outcome: Not Progressing   Problem: Education: Goal: Knowledge of disease or condition will improve Outcome: Not Progressing Goal: Knowledge of the prescribed therapeutic regimen will improve Outcome: Not Progressing Goal: Individualized Educational Video(s) Outcome: Not Progressing   Problem: Activity: Goal: Ability to tolerate increased activity will improve Outcome: Not Progressing Goal: Will verbalize the importance of balancing activity with adequate rest periods Outcome: Not Progressing   Problem: Respiratory: Goal: Ability to maintain a clear airway will improve Outcome: Not Progressing Goal: Levels of oxygenation will improve Outcome: Not Progressing Goal: Ability to maintain adequate ventilation will improve Outcome: Not Progressing

## 2024-07-11 NOTE — Progress Notes (Addendum)
 Nutrition Follow-up  DOCUMENTATION CODES:   Not applicable  INTERVENTION:  Monitor for diet advancement and tolerance Once able, initiate TF via Cortrak: Mallie Pinion Standard 1.4 at 45 ml/h (1080 ml per day) Initiate at 25ml/hr and increase by 10ml/hr q8h until goal rate achieved Prosource TF20 60 ml 1x/d Add Banatrol BID-provides 45kcal, 5g soluble fiber and 2g protein per serving.  Provides 1592 kcal, 87 gm protein, 778 ml free water daily   D/C Boost Breeze po TID, each supplement provides 250 kcal and 9 grams of protein Add Ensure Plus High Protein po BID, each supplement provides 350 kcal and 20 grams of protein,  Add Magic cup TID with meals, each supplement provides 290 kcal and 9 grams of protein MVI with minerals daily    NUTRITION DIAGNOSIS:  Inadequate oral intake related to acute illness as evidenced by NPO status.  GOAL:  Patient will meet greater than or equal to 90% of their needs  MONITOR:  Vent status, Labs, Weight trends, TF tolerance  REASON FOR ASSESSMENT:  Ventilator    ASSESSMENT:   Pt admitted with AMS, sepsis, ARF. 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  10/21 admitted d/t PNA, HMPV infection; intubated; stroke/sz ruled out 10/23 - failed vent wean; initiate tube feeds 10/24 - extubated 10/25 - reintubated 11/02 - Extubated, tube feeds D/C, no access 11/03 - advanced to clear liquid diet 11/04 - advanced to full liquid diet 11/05 - transfer out of ICU 11/06 - tentative Cortrak placement   Patient transferred out of ICU yesterday evening.  Patient has developed a fever after completion of ABX regimen on 11/03. WBCs also elevated. Remains on HHFNC at 30L w/ hypoxia.  Average Meal Intake 11/04: 0-50% x2 documented offerings   Pt has been on enteral nutrition since 10/23, meeting 100% of pt's needs, these were discontinued 10/22 as access was  pulled with pt being extubated. Diet now advanced to full liquids. Spoke with patient's daughter at bedside. Patient very lethargic due to receiving oxycodone. Daughter endorses poor intake since extubation. Consumed only 4 bites of grits and some Ensure today. Yesterday, approximately 25% potato soup and grits, Jell-O and Boost [employer].   Discussed insufficiency of patient's intake with daughter and recommendation for Cortrak placement to receive adequate protein/calories until patient can sustain this on her own. Daughter amicable to this, however thinks patient may not be, but will likely do what needs to be done, to improve. She plans to speak with her regarding this.  Did share some concern regarding s/sx of intolerance with Osmolite1.2 while in the ICU. States they were going to try KateFarms1.4 supplement, which arrived, but was was never started as patient was extubated. Will recommend previous regimen of KateFarms1.4 along with Banatrol and monitor tolerance. Shared recommendations with daughter.    Admit weight: 71 kg  Current weight: 64.2 kg UBW: 145 lbs   Has shown significant weight loss this admission: 9.6% in 16 days. Will complete repeat NFPE on follow up as she was receiving ultrasound at time of assessment. Continues with documented mild, pitting edema to BLEs. True body weight likely lower than what is documented. Bowels remain somewhat loose (type 6), but are moving/stable.   Meds: SS Novolog, K PHOS Neutral, IV ABX  Requiring phosphorus supplementation. Blood sugars stable, but not needing some of her ordered insulin due to parameters not being met.   Labs:  Na+ 137 (wdl) PHOS  2.0 (L) Mg 2.1 (wdl) WBC 12.6>11.9>11.0 (H) CBGS 100-111 x24 hours A1c 5.6 (06/2024)   Diet Order:   Diet Order             Diet full liquid Room service appropriate? Yes; Fluid consistency: Thin  Diet effective now                   EDUCATION NEEDS:  No education needs  have been identified at this time  Skin:  Skin Assessment: Reviewed RN Assessment  Last BM:  11/5 - type 6 x1  Height:  Ht Readings from Last 1 Encounters:  06/30/24 5' 1 (1.549 m)   Weight:  Wt Readings from Last 1 Encounters:  07/10/24 64.2 kg   Ideal Body Weight:  47.7 kg  BMI:  Body mass index is 26.74 kg/m.  Estimated Nutritional Needs:   Kcal:  1500-1700 kcal/d  Protein:  75-90 g/d  Fluid:  >/=1.5L  Blair Deaner MS, RD, LDN Registered Dietitian Clinical Nutrition RD Inpatient Contact Info in Amion

## 2024-07-12 ENCOUNTER — Inpatient Hospital Stay (HOSPITAL_COMMUNITY)

## 2024-07-12 ENCOUNTER — Other Ambulatory Visit: Payer: Self-pay

## 2024-07-12 DIAGNOSIS — J9622 Acute and chronic respiratory failure with hypercapnia: Secondary | ICD-10-CM | POA: Diagnosis not present

## 2024-07-12 DIAGNOSIS — I480 Paroxysmal atrial fibrillation: Secondary | ICD-10-CM | POA: Diagnosis not present

## 2024-07-12 DIAGNOSIS — R509 Fever, unspecified: Secondary | ICD-10-CM | POA: Diagnosis not present

## 2024-07-12 DIAGNOSIS — J9621 Acute and chronic respiratory failure with hypoxia: Secondary | ICD-10-CM | POA: Diagnosis not present

## 2024-07-12 LAB — BASIC METABOLIC PANEL WITH GFR
Anion gap: 11 (ref 5–15)
BUN: 11 mg/dL (ref 8–23)
CO2: 33 mmol/L — ABNORMAL HIGH (ref 22–32)
Calcium: 8.7 mg/dL — ABNORMAL LOW (ref 8.9–10.3)
Chloride: 97 mmol/L — ABNORMAL LOW (ref 98–111)
Creatinine, Ser: 0.65 mg/dL (ref 0.44–1.00)
GFR, Estimated: >60 mL/min (ref >60)
Glucose, Bld: 101 mg/dL — ABNORMAL HIGH (ref 70–99)
Potassium: 4 mmol/L (ref 3.5–5.1)
Sodium: 141 mmol/L (ref 135–145)

## 2024-07-12 LAB — CBC
HCT: 28.8 % — ABNORMAL LOW (ref 36.0–46.0)
Hemoglobin: 8.3 g/dL — ABNORMAL LOW (ref 12.0–15.0)
MCH: 29.3 pg (ref 26.0–34.0)
MCHC: 28.8 g/dL — ABNORMAL LOW (ref 30.0–36.0)
MCV: 101.8 fL — ABNORMAL HIGH (ref 80.0–100.0)
Platelets: 340 K/uL (ref 150–400)
RBC: 2.83 MIL/uL — ABNORMAL LOW (ref 3.87–5.11)
RDW: 13.3 % (ref 11.5–15.5)
WBC: 10.8 K/uL — ABNORMAL HIGH (ref 4.0–10.5)
nRBC: 0 % (ref 0.0–0.2)

## 2024-07-12 LAB — PHOSPHORUS
Phosphorus: 3.7 mg/dL (ref 2.5–4.6)
Phosphorus: 3.5 mg/dL (ref 2.5–4.6)

## 2024-07-12 LAB — GLUCOSE, CAPILLARY
Glucose-Capillary: 158 mg/dL — ABNORMAL HIGH (ref 70–99)
Glucose-Capillary: 160 mg/dL — ABNORMAL HIGH (ref 70–99)
Glucose-Capillary: 184 mg/dL — ABNORMAL HIGH (ref 70–99)
Glucose-Capillary: 214 mg/dL — ABNORMAL HIGH (ref 70–99)
Glucose-Capillary: 89 mg/dL (ref 70–99)

## 2024-07-12 LAB — MAGNESIUM: Magnesium: 1.9 mg/dL (ref 1.7–2.4)

## 2024-07-12 MED ORDER — SODIUM CHLORIDE 0.9 % IV SOLN
0.1500 mg/kg/h | INTRAVENOUS | Status: DC
  Administered 2024-07-12 – 2024-07-14 (×2): 0.15 mg/kg/h via INTRAVENOUS
  Filled 2024-07-12 (×2): qty 250

## 2024-07-12 MED ORDER — KATE FARMS STANDARD 1.4 EN LIQD
1000.0000 mL | ENTERAL | Status: DC
  Administered 2024-07-12 – 2024-07-15 (×3): 1000 mL via ORAL
  Filled 2024-07-12 (×6): qty 1000

## 2024-07-12 MED ORDER — IOHEXOL 350 MG/ML SOLN
60.0000 mL | Freq: Once | INTRAVENOUS | Status: AC | PRN
  Administered 2024-07-12: 60 mL via INTRAVENOUS

## 2024-07-12 MED ORDER — METOPROLOL TARTRATE 5 MG/5ML IV SOLN
2.5000 mg | Freq: Three times a day (TID) | INTRAVENOUS | Status: DC
  Administered 2024-07-12 – 2024-07-13 (×4): 2.5 mg via INTRAVENOUS
  Filled 2024-07-12 (×4): qty 5

## 2024-07-12 MED ORDER — PROSOURCE TF20 ENFIT COMPATIBL EN LIQD
60.0000 mL | Freq: Every day | ENTERAL | Status: DC
  Administered 2024-07-12 – 2024-07-17 (×6): 60 mL
  Filled 2024-07-12 (×7): qty 60

## 2024-07-12 MED ORDER — AMIODARONE LOAD VIA INFUSION
150.0000 mg | Freq: Once | INTRAVENOUS | Status: AC
  Administered 2024-07-12: 150 mg via INTRAVENOUS
  Filled 2024-07-12: qty 83.34

## 2024-07-12 MED ORDER — MORPHINE SULFATE (PF) 2 MG/ML IV SOLN
2.0000 mg | INTRAVENOUS | Status: DC | PRN
  Administered 2024-07-12 (×2): 2 mg via INTRAVENOUS
  Filled 2024-07-12 (×2): qty 1

## 2024-07-12 MED ORDER — SODIUM CHLORIDE 0.9% FLUSH
10.0000 mL | Freq: Two times a day (BID) | INTRAVENOUS | Status: DC
  Administered 2024-07-12 – 2024-07-13 (×2): 10 mL
  Administered 2024-07-13 – 2024-07-14 (×2): 20 mL
  Administered 2024-07-14: 10 mL
  Administered 2024-07-15: 20 mL
  Administered 2024-07-15: 10 mL
  Administered 2024-07-16: 20 mL
  Administered 2024-07-17: 10 mL
  Administered 2024-07-17: 20 mL
  Administered 2024-07-18 – 2024-07-21 (×7): 10 mL
  Administered 2024-07-22: 40 mL
  Administered 2024-07-22 – 2024-07-27 (×10): 10 mL
  Administered 2024-07-28: 30 mL
  Administered 2024-07-28 – 2024-07-30 (×4): 10 mL
  Administered 2024-07-30: 20 mL
  Administered 2024-07-31: 30 mL

## 2024-07-12 MED ORDER — BANATROL TF EN LIQD
60.0000 mL | Freq: Two times a day (BID) | ENTERAL | Status: DC
  Administered 2024-07-12 – 2024-07-18 (×13): 60 mL
  Filled 2024-07-12 (×13): qty 60

## 2024-07-12 MED ORDER — OXYCODONE HCL 5 MG PO TABS
5.0000 mg | ORAL_TABLET | Freq: Four times a day (QID) | ORAL | Status: DC | PRN
  Administered 2024-07-12: 5 mg via ORAL
  Filled 2024-07-12 (×2): qty 1

## 2024-07-12 MED ORDER — MORPHINE SULFATE (PF) 2 MG/ML IV SOLN
1.0000 mg | INTRAVENOUS | Status: DC | PRN
  Administered 2024-07-15 – 2024-07-17 (×3): 1 mg via INTRAVENOUS
  Filled 2024-07-12 (×3): qty 1

## 2024-07-12 MED ORDER — SODIUM CHLORIDE 0.9 % IV BOLUS
500.0000 mL | Freq: Once | INTRAVENOUS | Status: AC
  Administered 2024-07-12: 500 mL via INTRAVENOUS

## 2024-07-12 MED ORDER — SODIUM CHLORIDE 0.9% FLUSH: 10.0000 mL | INTRAVENOUS | Status: DC | PRN

## 2024-07-12 MED ORDER — AMIODARONE HCL IN DEXTROSE 360-4.14 MG/200ML-% IV SOLN
30.0000 mg/h | INTRAVENOUS | Status: DC
  Administered 2024-07-13 – 2024-07-16 (×7): 30 mg/h via INTRAVENOUS
  Filled 2024-07-12 (×7): qty 200

## 2024-07-12 MED ORDER — INSULIN ASPART 100 UNIT/ML IJ SOLN
0.0000 [IU] | INTRAMUSCULAR | Status: DC
  Administered 2024-07-12 – 2024-07-13 (×2): 2 [IU] via SUBCUTANEOUS
  Administered 2024-07-13: 3 [IU] via SUBCUTANEOUS
  Administered 2024-07-13 (×2): 2 [IU] via SUBCUTANEOUS
  Administered 2024-07-13 – 2024-07-14 (×2): 1 [IU] via SUBCUTANEOUS
  Administered 2024-07-14: 2 [IU] via SUBCUTANEOUS
  Administered 2024-07-14: 1 [IU] via SUBCUTANEOUS
  Administered 2024-07-14 – 2024-07-15 (×2): 2 [IU] via SUBCUTANEOUS
  Administered 2024-07-15: 1 [IU] via SUBCUTANEOUS
  Administered 2024-07-15: 5 [IU] via SUBCUTANEOUS
  Administered 2024-07-15 – 2024-07-16 (×2): 2 [IU] via SUBCUTANEOUS
  Administered 2024-07-16: 1 [IU] via SUBCUTANEOUS
  Administered 2024-07-16: 2 [IU] via SUBCUTANEOUS
  Administered 2024-07-16 – 2024-07-17 (×2): 1 [IU] via SUBCUTANEOUS
  Administered 2024-07-17 (×2): 2 [IU] via SUBCUTANEOUS
  Administered 2024-07-17: 1 [IU] via SUBCUTANEOUS
  Administered 2024-07-18: 2 [IU] via SUBCUTANEOUS
  Administered 2024-07-18: 3 [IU] via SUBCUTANEOUS
  Administered 2024-07-18 – 2024-07-20 (×5): 1 [IU] via SUBCUTANEOUS
  Administered 2024-07-20: 2 [IU] via SUBCUTANEOUS
  Administered 2024-07-20 – 2024-07-21 (×4): 1 [IU] via SUBCUTANEOUS
  Administered 2024-07-21 (×4): 2 [IU] via SUBCUTANEOUS
  Administered 2024-07-21: 3 [IU] via SUBCUTANEOUS
  Administered 2024-07-22 (×2): 1 [IU] via SUBCUTANEOUS
  Filled 2024-07-12: qty 2
  Filled 2024-07-12: qty 1
  Filled 2024-07-12: qty 2
  Filled 2024-07-12 (×2): qty 1
  Filled 2024-07-12: qty 2
  Filled 2024-07-12: qty 1
  Filled 2024-07-12 (×4): qty 2
  Filled 2024-07-12 (×6): qty 1
  Filled 2024-07-12: qty 2
  Filled 2024-07-12: qty 5
  Filled 2024-07-12: qty 2
  Filled 2024-07-12 (×2): qty 1
  Filled 2024-07-12 (×2): qty 2
  Filled 2024-07-12: qty 3
  Filled 2024-07-12: qty 1
  Filled 2024-07-12: qty 3
  Filled 2024-07-12: qty 1
  Filled 2024-07-12 (×2): qty 2
  Filled 2024-07-12: qty 3
  Filled 2024-07-12 (×2): qty 1

## 2024-07-12 MED ORDER — AMIODARONE HCL IN DEXTROSE 360-4.14 MG/200ML-% IV SOLN
60.0000 mg/h | INTRAVENOUS | Status: AC
  Administered 2024-07-12 (×2): 60 mg/h via INTRAVENOUS
  Filled 2024-07-12 (×2): qty 200

## 2024-07-12 NOTE — Procedures (Signed)
 Cortrak  Person Inserting Tube:  Pranit Owensby T, RD Tube Type:  Cortrak - 43 inches Tube Size:  10 Tube Location:  Left nare Secured by: Bridle Initial Placement:  Gastric Technique Used to Measure Tube Placement:  Marking at nare/corner of mouth Cortrak Secured At:  69 cm Initial Placement Verification:  Cortrak device (Registered Dieticians Only)  Cortrak Tube Team Note:  Consult received to place a Cortrak feeding tube.   No x-ray is required. RN may begin using tube.   If the tube becomes dislodged please keep the tube and contact the Cortrak team at [email or web address] for replacement.  If after hours and replacement cannot be delayed, place a NG tube and confirm placement with an abdominal x-ray.    Trude Ned RD, LDN Contact via [employer].

## 2024-07-12 NOTE — Plan of Care (Signed)
  Problem: Clinical Measurements: Goal: Will remain free from infection Outcome: Progressing   Problem: Nutrition: Goal: Adequate nutrition will be maintained Outcome: Progressing   Problem: Pain Managment: Goal: General experience of comfort will improve and/or be controlled Outcome: Progressing   Problem: Safety: Goal: Ability to remain free from injury will improve Outcome: Progressing

## 2024-07-12 NOTE — Progress Notes (Addendum)
 PT Cancellation Note  Patient Details Name: Katie Waller MRN: [id number] DOB: 01-14-1946   Cancelled Treatment:    Reason Eval/Treat Not Completed: (P) Fatigue/lethargy limiting ability to participate (per RN, pt lethargic in AM after getting IV pain meds. Note plan for Cortrak placement later in the day.)  Addendum 11:55AM, pt HR tachy to 150 bpm resting, tearful due to severe pain (RN notified), PTA defer tx at this time due to unstable HR and pt symptoms. Curious if pt may benefit from palliative consult to help with symptom mgmt? Will continue efforts per PT plan of care as schedule permits, may defer to weekend/following week when pain/HR better controlled.  Leonore Frankson M Bayli Quesinberry 07/12/2024, 9:30 AM

## 2024-07-12 NOTE — TOC Progression Note (Signed)
 Transition of Care [facility]) - Progression Note    Patient Details  Name: Katie Waller MRN: [id number] Date of Birth: Mar 17, 1946  Transition of [facility] CM/SW Contact  Lauraine FORBES Saa, LCSWA Phone Number: 07/12/2024, 12:32 PM  Clinical Narrative:     12:32 PM Per progressions, patient to receive cortrak and abdominal CT today. Patient is now on HHFNC. CSW will continue to follow.  Expected Discharge Plan: Skilled Nursing Facility Barriers to Discharge: Continued Medical Work up, English As A Second Language Teacher, SNF Pending bed offer               Expected Discharge Plan and Services In-house Referral: Clinical Social Work   Post Acute Care Choice: Skilled Nursing Facility Living arrangements for the past 2 months: Single Family Home                                       Social Drivers of Health (SDOH) Interventions SDOH Screenings   Food Insecurity: No Food Insecurity (07/04/2024)  Housing: Low Risk  (07/04/2024)  Transportation Needs: No Transportation Needs (07/04/2024)  Utilities: Not At Risk (07/04/2024)  Depression (PHQ2-9): Low Risk  (06/05/2023)  Social Connections: Patient Unable To Answer (07/09/2024)  Tobacco Use: Medium Risk (06/25/2024)    Readmission Risk Interventions     No data to display

## 2024-07-12 NOTE — Progress Notes (Addendum)
 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 Code stroke called. On arrival to ED patient emergently intubated for acute respiratory distress and airway protection. CT Head with no ICH or large infarct, CTA head and neck with no LVO. CXR concerning for a Pneumonia with visible BL lung apices on CTA demonstrating infection/inflammation. Code stroke cancelled with presentation suspicious for sepsis.  Patient was admitted to the intensive care unit.    Consultants: Critical care medicine  Procedures: Intubation.    Subjective/Interval History: Patient noted to be awake this morning but complaining of generalized body ache.  Daughter is at the bedside.  No specific symptoms offered.     Assessment/Plan:  Acute on chronic respiratory failure with hypoxia and hypercapnia/COPD with acute exacerbation/metapneumovirus pneumonia Superimposed bacterial pneumonia Persistent fever Patient was initially intubated.  She was extubated.   Remains on heated high flow nasal cannula. Patient recently completed course of antibiotics.  Initially on vancomycin and Zosyn and then on vancomycin and cefepime. Last dose of antibiotics was on 11/3. Patient spiked fever again on 11/6.  WBC was elevated.  Lactic acid level normal.  Procalcitonin mildly elevated.  Chest x-ray raise concern for new left base retrocardiac opacification.  Blood cultures were sent.  Patient was started back on antibiotics.  Currently on vancomycin and cefepime. Fever appears to be subsiding. Lower extremity Doppler study negative for DVT. Will proceed with CT  scan of the chest abdomen pelvis to rule out occult infection.  Hypotension Had low blood pressures which have improved.  Amlodipine  and ARB currently on hold.  May need to use as needed medications.  Atrial fibrillation with RVR This is new onset. Looks like she was given amiodarone in the ICU. She converted to sinus rhythm.  Currently has sinus tachycardia likely secondary to fever. TSH noted to be normal.   Echocardiogram shows normal LVEF.  Small pericardial effusion noted.  Echo was repeated and shows no worsening in pericardial effusion. ADDENDUM: Patient is back in afib with rvr. Will re-initiate Amio along with IV heparin.  Hypokalemia/hypophosphatemia Continue to supplement as indicated.  Phosphorus levels have improved.    Macrocytic anemia No evidence of overt bleeding.   Anemia panel shows ferritin of 762, iron 44, TIBC 227, percent saturation 19.  B12 1029 and folic acid greater than 20.    Ileus Resolved.    Acute kidney injury Resolved.  DVT Prophylaxis: Lovenox  Code Status: Full code Family Communication: Discussed with patient and her daughter Disposition Plan: SNF when medically stable    Medications: Scheduled:  arformoterol   15 mcg Nebulization BID   budesonide  (PULMICORT ) nebulizer solution  0.5 mg Nebulization BID   Chlorhexidine  Gluconate Cloth  6 each Topical Daily   enoxaparin  (LOVENOX ) injection  40 mg Subcutaneous Q24H   feeding supplement  237 mL Oral BID BM   insulin aspart  0-9 Units Subcutaneous TID WC   revefenacin  175 mcg Nebulization Daily   Continuous:  ceFEPime (MAXIPIME) IV 2 g (07/12/24 0922)   vancomycin 1,000 mg (07/12/24 0922)   PRN:acetaminophen , gabapentin, ipratropium-albuterol , labetalol , LORazepam , morphine  injection, ondansetron  (ZOFRAN ) IV, mouth rinse, oxyCODONE, polyethylene glycol, simethicone  Antibiotics: Anti-infectives (  From admission, onward)    Start     Dose/Rate Route Frequency Ordered Stop   07/11/24  0945  vancomycin (VANCOCIN) IVPB 1000 mg/200 mL premix        1,000 mg 200 mL/hr over 60 Minutes Intravenous Every 24 hours 07/11/24 0854     07/11/24 0945  ceFEPIme (MAXIPIME) 2 g in sodium chloride  0.9 % 100 mL IVPB        2 g 200 mL/hr over 30 Minutes Intravenous Every 12 hours 07/11/24 0854     07/08/24 1100  vancomycin (VANCOREADY) IVPB 750 mg/150 mL  Status:  Discontinued        750 mg 150 mL/hr over 60 Minutes Intravenous Every 24 hours 07/08/24 0951 07/08/24 1539   07/07/24 1000  vancomycin (VANCOREADY) IVPB 750 mg/150 mL  Status:  Discontinued        750 mg 150 mL/hr over 60 Minutes Intravenous Every 24 hours 07/06/24 0839 07/08/24 0951   07/06/24 0930  vancomycin (VANCOREADY) IVPB 1500 mg/300 mL        1,500 mg 150 mL/hr over 120 Minutes Intravenous  Once 07/06/24 0837 07/06/24 1327   07/06/24 0837  ceFEPIme (MAXIPIME) 2 g in sodium chloride  0.9 % 100 mL IVPB  Status:  Discontinued        2 g 200 mL/hr over 30 Minutes Intravenous Every 12 hours 07/06/24 0837 07/08/24 1539   06/30/24 1200  vancomycin (VANCOREADY) IVPB 1500 mg/300 mL  Status:  Discontinued        1,500 mg 150 mL/hr over 120 Minutes Intravenous Every 24 hours 06/29/24 1347 06/30/24 1332   06/29/24 1400  piperacillin-tazobactam (ZOSYN) IVPB 3.375 g        3.375 g 12.5 mL/hr over 240 Minutes Intravenous Every 8 hours 06/29/24 1238 07/06/24 0127   06/29/24 1330  vancomycin (VANCOREADY) IVPB 1500 mg/300 mL        1,500 mg 150 mL/hr over 120 Minutes Intravenous  Once 06/29/24 1238 06/29/24 1547   06/26/24 1015  cefTRIAXone  (ROCEPHIN ) 2 g in sodium chloride  0.9 % 100 mL IVPB        2 g 200 mL/hr over 30 Minutes Intravenous Every 24 hours 06/26/24 0929 06/29/24 1028   06/25/24 2100  ceFEPIme (MAXIPIME) 2 g in sodium chloride  0.9 % 100 mL IVPB  Status:  Discontinued        2 g 200 mL/hr over 30 Minutes Intravenous Every 12 hours 06/25/24 1259 06/26/24 0929   06/25/24 1330  azithromycin  (ZITHROMAX ) 500 mg in sodium  chloride 0.9 % 250 mL IVPB        500 mg 250 mL/hr over 60 Minutes Intravenous Every 24 hours 06/25/24 1239 06/27/24 1059   06/25/24 0945  vancomycin (VANCOREADY) IVPB 1250 mg/250 mL  Status:  Discontinued        1,250 mg 166.7 mL/hr over 90 Minutes Intravenous  Once 06/25/24 0940 06/26/24 0907   06/25/24 0930  ceFEPIme (MAXIPIME) 2 g in sodium chloride  0.9 % 100 mL IVPB        2 g 200 mL/hr over 30 Minutes Intravenous  Once 06/25/24 0929 06/25/24 1043   06/25/24 0930  metroNIDAZOLE (FLAGYL) IVPB 500 mg        500 mg 100 mL/hr over 60 Minutes Intravenous  Once 06/25/24 0929 06/25/24 1202   06/25/24 0930  vancomycin (VANCOCIN) IVPB 1000 mg/200 mL premix  Status:  Discontinued        1,000 mg 200 mL/hr over 60 Minutes Intravenous  Once  06/25/24 0929 06/25/24 0940       Objective:  Vital Signs  Vitals:   07/11/24 2300 07/12/24 0300 07/12/24 0700 07/12/24 0847  BP: 123/62 127/62 (!) 144/55   Pulse: (!) 113 (!) 109 (!) 119   Resp: 20 13 19    Temp: 99.9 F (37.7 C) 99.3 F (37.4 C) 99.9 F (37.7 C)   TempSrc: Oral Oral Oral   SpO2: 100% 100% 98% 99%  Weight:  66.7 kg    Height:        Intake/Output Summary (Last 24 hours) at 07/12/2024 0955 Last data filed at 07/12/2024 0417 Gross per 24 hour  Intake --  Output 750 ml  Net -750 ml   Filed Weights   07/08/24 0500 07/10/24 0540 07/12/24 0300  Weight: 66.8 kg 64.2 kg 66.7 kg    General appearance: Awake alert.  In no distress.  Distracted.  Lethargic. Resp: Clear to auscultation bilaterally.  Normal effort Cardio: S1-S2 is normal regular.  No S3-S4.  No rubs murmurs or bruit GI: Abdomen is soft.  Nontender nondistended.  Bowel sounds are present normal.  No masses organomegaly Extremities: No edema.  Physical deconditioning noted. No erythema noted over skin. No obvious focal neurological deficits.    Lab Results:  Data Reviewed: I have personally reviewed following labs and reports of the imaging  studies  CBC: Recent Labs  Lab 07/07/24 1003 07/08/24 0533 07/09/24 0606 07/11/24 0228 07/12/24 0841  WBC 15.9* 12.6* 11.9* 11.0* 10.8*  HGB 9.3* 8.4* 9.2* 8.9* 8.3*  HCT 30.8* 27.9* 31.6* 30.5* 28.8*  MCV 95.7 97.2 100.3* 100.0 101.8*  PLT 434* 396 422* 416* 340    Basic Metabolic Panel: Recent Labs  Lab 07/06/24 0515 07/07/24 1003 07/08/24 0533 07/08/24 1525 07/09/24 0606 07/10/24 0256 07/11/24 0228 07/12/24 0841  NA 139   < > 143  --  143 141 137 141  K 3.5   < > 3.0*  --  4.3 3.3* 4.1 4.0  CL 87*   < > 99  --  102 98 97* 97*  CO2 37*   < > 32  --  29 32 30 33*  GLUCOSE 94   < > 91  --  94 111* 100* 101*  BUN 41*   < > 28*  --  29* 24* 15 11  CREATININE 1.18*   < > 0.79  --  0.78 0.85 0.65 0.65  CALCIUM  9.9   < > 9.2  --  9.1 9.0 8.8* 8.7*  MG 2.1  --   --  2.3  --  2.1 2.1  --   PHOS  --   --   --   --   --   --  2.0* 3.7   < > = values in this interval not displayed.    GFR: Estimated Creatinine Clearance: 53.1 mL/min (by C-G formula based on SCr of 0.65 mg/dL).  Liver Function Tests: Recent Labs  Lab 07/08/24 0918  AST 46*  ALT 50*  ALKPHOS 42  BILITOT 0.7  PROT 5.9*  ALBUMIN 2.3*    Recent Labs  Lab 07/08/24 0918 07/09/24 0606  LIPASE 165* 164*   BNP (last 3 results) Recent Labs    03/26/24 1105  PROBNP 142.0   CBG: Recent Labs  Lab 07/11/24 0629 07/11/24 1148 07/11/24 1701 07/11/24 2207 07/12/24 0632  GLUCAP 98 194* 111* 111* 89    Recent Results (from the past 240 hours)  Culture, Respiratory w Gram Stain  Status: None   Collection Time: 07/02/24 12:17 PM   Specimen: Tracheal Aspirate; Respiratory  Result Value Ref Range Status   Specimen Description TRACHEAL ASPIRATE  Final   Special Requests NONE  Final   Gram Stain   Final    FEW WBC PRESENT, PREDOMINANTLY PMN NO ORGANISMS SEEN Performed at [facility], [address], [place], [place] [address]    Culture   Final    RARE CANDIDA ALBICANS RARE FUNGUS  (MOLD) ISOLATED, PROBABLE CONTAMINANT/COLONIZER (SAPROPHYTE). CONTACT MICROBIOLOGY IF FURTHER IDENTIFICATION REQUIRED [phone].    Report Status 07/05/2024 FINAL  Final  MRSA Next Gen by PCR, Nasal     Status: None   Collection Time: 07/06/24  8:16 AM   Specimen: Nasal Mucosa; Nasal Swab  Result Value Ref Range Status   MRSA by PCR Next Gen NOT DETECTED NOT DETECTED Final    Comment: (NOTE) The GeneXpert MRSA Assay (FDA approved for NASAL specimens only), is one component of a comprehensive MRSA colonization surveillance program. It is not intended to diagnose MRSA infection nor to guide or monitor treatment for MRSA infections. Test performance is not FDA approved in patients less than 59 years old. Performed at [facility], [address] [place], [place] [address]   Culture, Respiratory w Gram Stain     Status: None   Collection Time: 07/06/24 11:35 AM   Specimen: Tracheal Aspirate; Respiratory  Result Value Ref Range Status   Specimen Description TRACHEAL ASPIRATE  Final   Special Requests NONE  Final   Gram Stain   Final    NO WBC SEEN RARE YEAST Performed at [facility], [address], [place], [place] [address]    Culture FEW CANDIDA ALBICANS  Final   Report Status 07/08/2024 FINAL  Final  Culture, blood (Routine X 2) w Reflex to ID Panel     Status: None (Preliminary result)   Collection Time: 07/11/24 10:19 AM   Specimen: BLOOD  Result Value Ref Range Status   Specimen Description BLOOD BLOOD LEFT ARM  Final   Special Requests   Final    BOTTLES DRAWN AEROBIC AND ANAEROBIC Blood Culture results may not be optimal due to an inadequate volume of blood received in culture bottles   Culture   Final    NO GROWTH < 24 HOURS Performed at [facility], [address], [place], [place] [address]    Report Status PENDING  Incomplete  Culture, blood (Routine X 2) w Reflex to ID Panel     Status: None (Preliminary result)   Collection Time: 07/11/24  10:19 AM   Specimen: BLOOD  Result Value Ref Range Status   Specimen Description BLOOD BLOOD LEFT ARM  Final   Special Requests   Final    BOTTLES DRAWN AEROBIC ONLY Blood Culture adequate volume   Culture   Final    NO GROWTH < 24 HOURS Performed at [facility], [address], [place], [place] [address]    Report Status PENDING  Incomplete      Radiology Studies: VAS US  LOWER EXTREMITY VENOUS (DVT) Result Date: 07/12/2024  Lower Venous DVT Study Patient Name:  ALYNAH SCHONE  Date of Exam:   07/11/2024 Medical Rec #: [id number]         Accession #:    [id number] Date of Birth: 12-14-1945        Patient Gender: F Patient Age:   17 years Exam Location:  [facility]  [place] Procedure:      VAS US  LOWER EXTREMITY VENOUS (DVT) Referring Phys: JOETTE PEBBLES --------------------------------------------------------------------------------  Indications: Swelling, Edema, stroke, and hypoxia, respiratory failure.  Comparison Study: Previous study on 11.26.2018. Performing Technologist: Edilia Elden Appl  Examination Guidelines: A complete evaluation includes B-mode imaging, spectral Doppler, color Doppler, and power Doppler as needed of all accessible portions of each vessel. Bilateral testing is considered an integral part of a complete examination. Limited examinations for reoccurring indications may be performed as noted. The reflux portion of the exam is performed with the patient in reverse Trendelenburg.  +---------+---------------+---------+-----------+----------+--------------+ RIGHT    CompressibilityPhasicitySpontaneityPropertiesThrombus Aging +---------+---------------+---------+-----------+----------+--------------+ CFV      Full           Yes      Yes                                 +---------+---------------+---------+-----------+----------+--------------+ SFJ      Full           Yes      Yes                                  +---------+---------------+---------+-----------+----------+--------------+ FV Prox  Full                                                        +---------+---------------+---------+-----------+----------+--------------+ FV Mid   Full                                                        +---------+---------------+---------+-----------+----------+--------------+ FV DistalFull                                                        +---------+---------------+---------+-----------+----------+--------------+ PFV      Full                                                        +---------+---------------+---------+-----------+----------+--------------+ POP      Full           Yes      Yes                                 +---------+---------------+---------+-----------+----------+--------------+ PTV      Full                                                        +---------+---------------+---------+-----------+----------+--------------+ PERO     Full                                                        +---------+---------------+---------+-----------+----------+--------------+   +---------+---------------+---------+-----------+----------+--------------+  LEFT     CompressibilityPhasicitySpontaneityPropertiesThrombus Aging +---------+---------------+---------+-----------+----------+--------------+ CFV      Full           Yes      Yes                                 +---------+---------------+---------+-----------+----------+--------------+ SFJ      Full           Yes      Yes                                 +---------+---------------+---------+-----------+----------+--------------+ FV Prox  Full                                                        +---------+---------------+---------+-----------+----------+--------------+ FV Mid   Full                                                         +---------+---------------+---------+-----------+----------+--------------+ FV DistalFull                                                        +---------+---------------+---------+-----------+----------+--------------+ PFV      Full                                                        +---------+---------------+---------+-----------+----------+--------------+ POP      Full           Yes      Yes                                 +---------+---------------+---------+-----------+----------+--------------+ PTV      Full                                                        +---------+---------------+---------+-----------+----------+--------------+ PERO     Full                                                        +---------+---------------+---------+-----------+----------+--------------+     Summary: BILATERAL: - No evidence of deep vein thrombosis seen in the lower extremities, bilaterally. -No evidence of popliteal cyst, bilaterally.   *See table(s) above for measurements and observations. Electronically signed by Lonni Gaskins MD on 07/12/2024 at 9:36:05 AM.    Final    DG  CHEST PORT 1 VIEW Result Date: 07/11/2024 EXAM: 1 VIEW(S) XRAY OF THE CHEST 07/11/2024 09:01:12 AM COMPARISON: 07/07/2024 CLINICAL HISTORY: Fever FINDINGS: LINES, TUBES AND DEVICES: Enteric tube removed. Extubation. LUNGS AND PLEURA: Small bilateral pleural effusions, increased from prior exams. Left base retrocardiac opacification is new from the previous exam and may reflect atelectasis aspiration for pneumonia. Mild peripheral opacity in the right base, nonspecific. Metallic clip projecting over left hilum. No pulmonary edema. No pneumothorax. HEART AND MEDIASTINUM: Aortic atherosclerosis. BONES AND SOFT TISSUES: No acute osseous abnormality. IMPRESSION: 1. New left base retrocardiac opacification, possibly atelectasis or aspiration pneumonia. 2. Small bilateral pleural effusions, increased from  prior exams. Electronically signed by: Waller Calk MD 07/11/2024 03:02 PM EST RP Workstation: [id number]   ECHOCARDIOGRAM LIMITED Result Date: 07/10/2024    ECHOCARDIOGRAM LIMITED REPORT   Patient Name:   Katie Waller Date of Exam: 07/10/2024 Medical Rec #:  [id number]        Height:       61.0 in Accession #:    [id number]       Weight:       141.5 lb Date of Birth:  1945/10/14       BSA:          1.631 m Patient Age:    77 years         BP:           156/78 mmHg Patient Gender: F                HR:           118 bpm. Exam Location:  Inpatient Procedure: 2D Echo, Cardiac Doppler and Color Doppler (Both Spectral and Color            Flow Doppler were utilized during procedure). Indications:    Atrail Fibrillation                 Pericardial Effusion  History:        Patient has prior history of Echocardiogram examinations, most                 recent 07/01/2024. Arrythmias:Atrial Fibrillation.  Sonographer:    Koleen Popper RDCS Referring Phys: [phone] [facility]  Sonographer Comments: Image acquisition challenging due to respiratory motion. IMPRESSIONS  1. Left ventricular ejection fraction, by estimation, is 65 to 70%. The left ventricle has normal function. Left ventricular endocardial border not optimally defined to evaluate regional wall motion. There is mild left ventricular hypertrophy. Left ventricular diastolic parameters are consistent with Grade I diastolic dysfunction (impaired relaxation).  2. Right ventricular systolic function is normal. The right ventricular size is normal.  3. A small pericardial effusion is present. There is no evidence of cardiac tamponade.  4. Mild mitral valve regurgitation. FINDINGS  Left Ventricle: Left ventricular ejection fraction, by estimation, is 65 to 70%. The left ventricle has normal function. Left ventricular endocardial border not optimally defined to evaluate regional wall motion. There is mild left ventricular hypertrophy. Left ventricular diastolic  parameters are consistent with Grade I diastolic dysfunction (impaired relaxation). Left ventricular diastolic function could not be evaluated due to atrial fibrillation. Normal left ventricular filling pressure. Right Ventricle: The right ventricular size is normal. No increase in right ventricular wall thickness. Right ventricular systolic function is normal. Left Atrium: Left atrial size was normal in size. Right Atrium: Right atrial size was normal in size. Pericardium: A small pericardial effusion is present. There is no evidence of cardiac tamponade.  Mitral Valve: Mild mitral valve regurgitation. Tricuspid Valve: Tricuspid valve regurgitation is mild. Aortic Valve: Aortic valve peak gradient measures 12.7 mmHg. Additional Comments: Spectral Doppler performed. Color Doppler performed.  LEFT VENTRICLE PLAX 2D LVIDd:         4.80 cm Diastology LVIDs:         2.90 cm LV e' medial:    7.65 cm/s LV PW:         1.20 cm LV E/e' medial:  9.4 LV IVS:        0.90 cm LV e' lateral:   8.21 cm/s                        LV E/e' lateral: 8.7  RIGHT VENTRICLE         IVC TAPSE (M-mode): 1.7 cm  IVC diam: 2.10 cm LEFT ATRIUM         Index LA diam:    3.90 cm 2.39 cm/m  AORTIC VALVE AV Vmax:      178.00 cm/s AV Peak Grad: 12.7 mmHg LVOT Vmax:    166.00 cm/s LVOT Vmean:   114.000 cm/s LVOT VTI:     0.240 m  AORTA Ao Asc diam: 3.60 cm MITRAL VALVE MV Area (PHT): 4.49 cm     SHUNTS MV Decel Time: 169 msec     Systemic VTI: 0.24 m MV E velocity: 71.80 cm/s MV A velocity: 104.00 cm/s MV E/A ratio:  0.69 Annabella Scarce MD Electronically signed by Annabella Scarce MD Signature Date/Time: 07/10/2024/5:15:41 PM    Final        LOS: 17 days   Joette Pebbles  [facility] Pager on [email or web address]  07/12/2024, 9:55 AM

## 2024-07-12 NOTE — Progress Notes (Signed)
 Brief Nutrition Support Note   Pt had Cortrak placed today. Rd initiated tube feeding via Cortrak. RN and family made aware.  Tube feeding regimen: Initiate TF via Cortrak: Mallie Pinion Standard 1.4 at 45 ml/h (1080 ml per day) Initiate at 25ml/hr and increase by 10ml/hr q8h until goal rate achieved Prosource TF20 60 ml 1x/d Banatrol BID-provides 45kcal, 5g soluble fiber and 2g protein per serving.  Provides 1592 kcal, 87 gm protein, 778 ml free water daily   Will monitor for tolerance and adjust rate as needed if PO intake improves and diet can be advanced.   Josette Glance, MS, RDN, LDN Clinical Dietitian I Please reach out via secure chat

## 2024-07-12 NOTE — Plan of Care (Signed)
   Problem: Clinical Measurements: Goal: Will remain free from infection Outcome: Progressing Goal: Respiratory complications will improve Outcome: Progressing Goal: Cardiovascular complication will be avoided Outcome: Progressing   Problem: Activity: Goal: Risk for activity intolerance will decrease Outcome: Progressing   Problem: Nutrition: Goal: Adequate nutrition will be maintained Outcome: Progressing

## 2024-07-12 NOTE — Care Management Important Message (Signed)
 Important Message  Patient Details  Name: Katie Waller MRN: [id number] Date of Birth: [birth year]   Important Message Given:  Yes - Medicare IM     Claretta Deed 07/12/2024, 1:51 PM

## 2024-07-12 NOTE — Progress Notes (Signed)
 PHARMACY - ANTICOAGULATION CONSULT NOTE  Pharmacy Consult:  Bivalirudin  Indication: atrial fibrillation  Allergies  Allergen Reactions   Ace Inhibitors Swelling   Diphenhydramine Other (See Comments)    Hyperactive/jittery    Doxycycline Hyclate Nausea And Vomiting   Codeine Nausea And Vomiting    Patient Measurements: Height: 5' 1 (154.9 cm) Weight: 66.7 kg (147 lb 0.8 oz) IBW/kg (Calculated) : 47.8 HEPARIN DW (KG): 63.1  Vital Signs: Temp: 100.9 F (38.3 C) (11/07 1700) Temp Source: Oral (11/07 1700) BP: 159/84 (11/07 1700) Pulse Rate: 123 (11/07 1736)  Labs: Recent Labs    07/10/24 0256 07/11/24 0228 07/12/24 0841  HGB  --  8.9* 8.3*  HCT  --  30.5* 28.8*  PLT  --  416* 340  CREATININE 0.85 0.65 0.65    Estimated Creatinine Clearance: 53.1 mL/min (by C-G formula based on SCr of 0.65 mg/dL).   Medical History: Past Medical History:  Diagnosis Date   Abnormal finding on EKG 09/19/2013   Acute on chronic respiratory failure with hypercapnia (HCC)    Acute respiratory failure (HCC) 06/08/2016   Anxiety 11/09/2020   Arthritis    Asthma    Carotid bruit 11/09/2020   Chronic diastolic CHF (congestive heart failure) (HCC) 07/28/2017   Chronic obstructive pulmonary disease, unspecified (HCC) 11/09/2020   COPD, group D, by GOLD 2017 classification (HCC) 09/17/2013   Cough 11/09/2020   Decreased estrogen level 11/09/2020   Diabetes (HCC)    Dyspnea    Edema 11/09/2020   Essential hypertension 09/18/2013   Goals of care, counseling/discussion    Hardening of the aorta (main artery of the heart) 11/09/2020   Heart disease    History of radiation therapy    Left Lung- 06/20/23-06/26/23- Dr. 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  She was previously on IV heparin and was switched to bivalirudin due to labile heparin levels.  Med was stopped because it was thought that Afib was secondary to critical illness, which resolved.  CBC stable; no bleeding reported.    Noted last prophylactic Lovenox  dose given on 11/7 at 1201.  Goal of Therapy:  aPTT 50-85 seconds Monitor platelets by anticoagulation protocol: Yes   Plan:  Resume bivalirudin at 0.15 mg/kg/hr Check 4 hr aPTT Daily aPTT and CBC  Kalliope Riesen D. Lendell, PharmD, BCPS, BCCCP 07/12/2024, 8:13 PM

## 2024-07-12 NOTE — Progress Notes (Signed)
 Peripherally Inserted Central Catheter Placement  The IV Nurse has discussed with the patient and/or persons authorized to consent for the patient, the purpose of this procedure and the potential benefits and risks involved with this procedure.  The benefits include less needle sticks, lab draws from the catheter, and the patient may be discharged home with the catheter. Risks include, but not limited to, infection, bleeding, blood clot (thrombus formation), and puncture of an artery; nerve damage and irregular heartbeat and possibility to perform a PICC exchange if needed/ordered by physician.  Alternatives to this procedure were also discussed.  Bard Power PICC patient education guide, fact sheet on infection prevention and patient information card has been provided to patient /or left at bedside. Daughter at bedside and signed consent for PICC placement.    PICC Placement Documentation  PICC Double Lumen 07/12/24 Right Basilic 35 cm 0 cm (Active)  Indication for Insertion or Continuance of Line Limited venous access - need for IV therapy >5 days (PICC only);Vasoactive infusions 07/12/24 1701  Exposed Catheter (cm) 0 cm 07/12/24 1701  Site Assessment Clean, Dry, Intact 07/12/24 1701  Lumen #1 Status Flushed;Saline locked;Blood return noted 07/12/24 1701  Lumen #2 Status Flushed;Saline locked;Blood return noted 07/12/24 1701  Dressing Type Transparent;Securing device 07/12/24 1701  Dressing Status Antimicrobial disc/dressing in place 07/12/24 1701  Line Care Connections checked and tightened 07/12/24 1701  Line Adjustment (NICU/IV Team Only) No 07/12/24 1701  Dressing Intervention New dressing;Adhesive placed at insertion site (IV team only) 07/12/24 1701  Dressing Change Due 07/19/24 07/12/24 1701       Leita  Kataryna Mcquilkin 07/12/2024, 5:02 PM

## 2024-07-13 ENCOUNTER — Inpatient Hospital Stay (HOSPITAL_COMMUNITY)

## 2024-07-13 DIAGNOSIS — I480 Paroxysmal atrial fibrillation: Secondary | ICD-10-CM | POA: Diagnosis not present

## 2024-07-13 DIAGNOSIS — A419 Sepsis, unspecified organism: Secondary | ICD-10-CM | POA: Diagnosis not present

## 2024-07-13 DIAGNOSIS — J9622 Acute and chronic respiratory failure with hypercapnia: Secondary | ICD-10-CM | POA: Diagnosis not present

## 2024-07-13 DIAGNOSIS — R6521 Severe sepsis with septic shock: Secondary | ICD-10-CM | POA: Diagnosis not present

## 2024-07-13 DIAGNOSIS — R509 Fever, unspecified: Secondary | ICD-10-CM | POA: Diagnosis not present

## 2024-07-13 DIAGNOSIS — J9621 Acute and chronic respiratory failure with hypoxia: Secondary | ICD-10-CM | POA: Diagnosis not present

## 2024-07-13 LAB — POCT I-STAT 7, (LYTES, BLD GAS, ICA,H+H)
Acid-Base Excess: 9 mmol/L — ABNORMAL HIGH (ref 0.0–2.0)
Bicarbonate: 38.5 mmol/L — ABNORMAL HIGH (ref 20.0–28.0)
Calcium, Ion: 1.36 mmol/L (ref 1.15–1.40)
HCT: 23 % — ABNORMAL LOW (ref 36.0–46.0)
Hemoglobin: 7.8 g/dL — ABNORMAL LOW (ref 12.0–15.0)
O2 Saturation: 100 %
Patient temperature: 98.8
Potassium: 4.3 mmol/L (ref 3.5–5.1)
Sodium: 139 mmol/L (ref 135–145)
TCO2: 41 mmol/L — ABNORMAL HIGH (ref 22–32)
pCO2 arterial: 90.4 mmHg (ref 32–48)
pH, Arterial: 7.238 — ABNORMAL LOW (ref 7.35–7.45)
pO2, Arterial: 364 mmHg — ABNORMAL HIGH (ref 83–108)

## 2024-07-13 LAB — BASIC METABOLIC PANEL WITH GFR
Anion gap: 8 (ref 5–15)
BUN: 21 mg/dL (ref 8–23)
CO2: 37 mmol/L — ABNORMAL HIGH (ref 22–32)
Calcium: 8.7 mg/dL — ABNORMAL LOW (ref 8.9–10.3)
Chloride: 93 mmol/L — ABNORMAL LOW (ref 98–111)
Creatinine, Ser: 0.79 mg/dL (ref 0.44–1.00)
GFR, Estimated: >60 mL/min (ref >60)
Glucose, Bld: 259 mg/dL — ABNORMAL HIGH (ref 70–99)
Potassium: 4.1 mmol/L (ref 3.5–5.1)
Sodium: 138 mmol/L (ref 135–145)

## 2024-07-13 LAB — LACTATE DEHYDROGENASE, PLEURAL OR PERITONEAL FLUID
LD, Fluid: 74 U/L — ABNORMAL HIGH (ref 3–23)
LD, Fluid: 76 U/L — ABNORMAL HIGH (ref 3–23)

## 2024-07-13 LAB — BODY FLUID CELL COUNT WITH DIFFERENTIAL
Eos, Fluid: 0 %
Eos, Fluid: 2 %
Lymphs, Fluid: 64 %
Lymphs, Fluid: 74 %
Monocyte-Macrophage-Serous Fluid: 2 % — ABNORMAL LOW (ref 50–90)
Monocyte-Macrophage-Serous Fluid: 8 % — ABNORMAL LOW (ref 50–90)
Neutrophil Count, Fluid: 16 % (ref 0–25)
Neutrophil Count, Fluid: 34 % — ABNORMAL HIGH (ref 0–25)
Total Nucleated Cell Count, Fluid: 214 uL (ref 0–1000)
Total Nucleated Cell Count, Fluid: 245 uL (ref 0–1000)

## 2024-07-13 LAB — GLUCOSE, CAPILLARY
Glucose-Capillary: 150 mg/dL — ABNORMAL HIGH (ref 70–99)
Glucose-Capillary: 157 mg/dL — ABNORMAL HIGH (ref 70–99)
Glucose-Capillary: 160 mg/dL — ABNORMAL HIGH (ref 70–99)
Glucose-Capillary: 189 mg/dL — ABNORMAL HIGH (ref 70–99)

## 2024-07-13 LAB — CBC
HCT: 26.2 % — ABNORMAL LOW (ref 36.0–46.0)
Hemoglobin: 7.3 g/dL — ABNORMAL LOW (ref 12.0–15.0)
MCH: 29.2 pg (ref 26.0–34.0)
MCHC: 27.9 g/dL — ABNORMAL LOW (ref 30.0–36.0)
MCV: 104.8 fL — ABNORMAL HIGH (ref 80.0–100.0)
Platelets: 348 K/uL (ref 150–400)
RBC: 2.5 MIL/uL — ABNORMAL LOW (ref 3.87–5.11)
RDW: 13.5 % (ref 11.5–15.5)
WBC: 10 K/uL (ref 4.0–10.5)
nRBC: 0.2 % (ref 0.0–0.2)

## 2024-07-13 LAB — LIPASE, BLOOD: Lipase: 99 U/L — ABNORMAL HIGH (ref 11–51)

## 2024-07-13 LAB — BLOOD GAS, ARTERIAL
Acid-Base Excess: 8.2 mmol/L — ABNORMAL HIGH (ref 0.0–2.0)
Bicarbonate: 40.3 mmol/L — ABNORMAL HIGH (ref 20.0–28.0)
O2 Saturation: 96 %
Patient temperature: 37
pCO2 arterial: 103 mmHg (ref 32–48)
pH, Arterial: 7.2 — ABNORMAL LOW (ref 7.35–7.45)
pO2, Arterial: 71 mmHg — ABNORMAL LOW (ref 83–108)

## 2024-07-13 LAB — PROTEIN, PLEURAL OR PERITONEAL FLUID
Total protein, fluid: <3 g/dL
Total protein, fluid: <3 g/dL

## 2024-07-13 LAB — HEPATIC FUNCTION PANEL
ALT: 24 U/L (ref 0–44)
AST: 18 U/L (ref 15–41)
Albumin: 2 g/dL — ABNORMAL LOW (ref 3.5–5.0)
Alkaline Phosphatase: 40 U/L (ref 38–126)
Bilirubin, Direct: <0.1 mg/dL (ref 0.0–0.2)
Total Bilirubin: 0.2 mg/dL (ref 0.0–1.2)
Total Protein: 5.1 g/dL — ABNORMAL LOW (ref 6.5–8.1)

## 2024-07-13 LAB — COOXEMETRY PANEL
Carboxyhemoglobin: 2.9 % — ABNORMAL HIGH (ref 0.5–1.5)
Methemoglobin: 1.1 % (ref 0.0–1.5)
O2 Saturation: 94.8 %
Total hemoglobin: 7 g/dL — ABNORMAL LOW (ref 12.0–16.0)

## 2024-07-13 LAB — LACTIC ACID, PLASMA: Lactic Acid, Venous: 0.8 mmol/L (ref 0.5–1.9)

## 2024-07-13 LAB — APTT
aPTT: 65 s — ABNORMAL HIGH (ref 24–36)
aPTT: 72 s — ABNORMAL HIGH (ref 24–36)

## 2024-07-13 LAB — GLUCOSE, PLEURAL OR PERITONEAL FLUID
Glucose, Fluid: 224 mg/dL
Glucose, Fluid: 226 mg/dL

## 2024-07-13 LAB — PHOSPHORUS: Phosphorus: 3.3 mg/dL (ref 2.5–4.6)

## 2024-07-13 LAB — MAGNESIUM: Magnesium: 2.1 mg/dL (ref 1.7–2.4)

## 2024-07-13 MED ORDER — PROPOFOL 1000 MG/100ML IV EMUL
0.0000 ug/kg/min | INTRAVENOUS | Status: DC
  Administered 2024-07-13: 20 ug/kg/min via INTRAVENOUS
  Administered 2024-07-14: 40 ug/kg/min via INTRAVENOUS
  Administered 2024-07-14: 50 ug/kg/min via INTRAVENOUS
  Administered 2024-07-14: 25 ug/kg/min via INTRAVENOUS
  Administered 2024-07-14 – 2024-07-15 (×3): 50 ug/kg/min via INTRAVENOUS
  Filled 2024-07-13 (×2): qty 100
  Filled 2024-07-13: qty 200
  Filled 2024-07-13 (×3): qty 100

## 2024-07-13 MED ORDER — PHENYLEPHRINE 80 MCG/ML (10ML) SYRINGE FOR IV PUSH (FOR BLOOD PRESSURE SUPPORT)
PREFILLED_SYRINGE | INTRAVENOUS | Status: AC
  Administered 2024-07-13: 160 ug
  Filled 2024-07-13: qty 10

## 2024-07-13 MED ORDER — MIDAZOLAM HCL (PF) 2 MG/2ML IJ SOLN
2.0000 mg | Freq: Once | INTRAMUSCULAR | Status: AC
  Administered 2024-07-13: 2 mg via INTRAVENOUS

## 2024-07-13 MED ORDER — SODIUM CHLORIDE 0.9 % IV SOLN
1.0000 g | Freq: Three times a day (TID) | INTRAVENOUS | Status: DC
  Administered 2024-07-13 – 2024-07-14 (×3): 1 g via INTRAVENOUS
  Filled 2024-07-13 (×4): qty 20

## 2024-07-13 MED ORDER — FENTANYL CITRATE (PF) 50 MCG/ML IJ SOSY
PREFILLED_SYRINGE | INTRAMUSCULAR | Status: AC
  Filled 2024-07-13: qty 2

## 2024-07-13 MED ORDER — NOREPINEPHRINE 4 MG/250ML-% IV SOLN
0.0000 ug/min | INTRAVENOUS | Status: DC
  Administered 2024-07-13: 2 ug/min via INTRAVENOUS
  Administered 2024-07-14: 8 ug/min via INTRAVENOUS
  Administered 2024-07-14: 7 ug/min via INTRAVENOUS
  Filled 2024-07-13 (×3): qty 250

## 2024-07-13 MED ORDER — MIDAZOLAM HCL 2 MG/2ML IJ SOLN
INTRAMUSCULAR | Status: AC
  Filled 2024-07-13: qty 2

## 2024-07-13 MED ORDER — DOCUSATE SODIUM 50 MG/5ML PO LIQD
100.0000 mg | Freq: Two times a day (BID) | ORAL | Status: DC
  Administered 2024-07-13 – 2024-07-16 (×6): 100 mg
  Filled 2024-07-13 (×6): qty 10

## 2024-07-13 MED ORDER — FENTANYL CITRATE (PF) 50 MCG/ML IJ SOSY
25.0000 ug | PREFILLED_SYRINGE | Freq: Once | INTRAMUSCULAR | Status: AC
  Administered 2024-07-14: 50 ug via INTRAVENOUS

## 2024-07-13 MED ORDER — POLYETHYLENE GLYCOL 3350 17 G PO PACK
17.0000 g | PACK | Freq: Every day | ORAL | Status: DC
  Administered 2024-07-14 – 2024-07-30 (×13): 17 g
  Filled 2024-07-13 (×14): qty 1

## 2024-07-13 MED ORDER — ROCURONIUM BROMIDE 10 MG/ML (PF) SYRINGE
PREFILLED_SYRINGE | INTRAVENOUS | Status: AC
  Administered 2024-07-13: 100 mg
  Filled 2024-07-13: qty 10

## 2024-07-13 MED ORDER — ALBUMIN HUMAN 25 % IV SOLN
25.0000 g | Freq: Four times a day (QID) | INTRAVENOUS | Status: AC
  Administered 2024-07-13 – 2024-07-14 (×2): 12.5 g via INTRAVENOUS
  Administered 2024-07-14 (×2): 25 g via INTRAVENOUS
  Filled 2024-07-13 (×4): qty 100

## 2024-07-13 MED ORDER — DEXMEDETOMIDINE HCL IN NACL 400 MCG/100ML IV SOLN: 0.0000 ug/kg/h | INTRAVENOUS | Status: DC

## 2024-07-13 MED ORDER — SODIUM CHLORIDE 3 % IN NEBU
4.0000 mL | INHALATION_SOLUTION | Freq: Four times a day (QID) | RESPIRATORY_TRACT | Status: AC
  Administered 2024-07-13 – 2024-07-17 (×14): 4 mL via RESPIRATORY_TRACT
  Filled 2024-07-13 (×14): qty 4

## 2024-07-13 MED ORDER — ETOMIDATE 2 MG/ML IV SOLN
INTRAVENOUS | Status: AC
  Administered 2024-07-13: 20 mg
  Filled 2024-07-13: qty 20

## 2024-07-13 MED ORDER — ACETAMINOPHEN 160 MG/5ML PO SOLN
650.0000 mg | Freq: Four times a day (QID) | ORAL | Status: DC | PRN
  Administered 2024-07-13 – 2024-07-16 (×3): 650 mg
  Filled 2024-07-13 (×3): qty 20.3

## 2024-07-13 MED ORDER — FENTANYL BOLUS VIA INFUSION: 25.0000 ug | INTRAVENOUS | Status: DC | PRN

## 2024-07-13 MED ORDER — FENTANYL CITRATE (PF) 50 MCG/ML IJ SOSY
50.0000 ug | PREFILLED_SYRINGE | Freq: Once | INTRAMUSCULAR | Status: AC
Start: 2024-07-13 — End: 2024-07-13
  Administered 2024-07-13: 50 ug via INTRAVENOUS

## 2024-07-13 MED ORDER — DEXMEDETOMIDINE HCL IN NACL 400 MCG/100ML IV SOLN
0.0000 ug/kg/h | INTRAVENOUS | Status: DC
  Administered 2024-07-13: 0.4 ug/kg/h via INTRAVENOUS
  Filled 2024-07-13: qty 100

## 2024-07-13 MED ORDER — SUCCINYLCHOLINE CHLORIDE 200 MG/10ML IV SOSY
PREFILLED_SYRINGE | INTRAVENOUS | Status: AC
  Filled 2024-07-13: qty 10

## 2024-07-13 MED ORDER — FUROSEMIDE 10 MG/ML IJ SOLN
40.0000 mg | Freq: Four times a day (QID) | INTRAMUSCULAR | Status: AC
  Administered 2024-07-13 – 2024-07-14 (×2): 40 mg via INTRAVENOUS
  Filled 2024-07-13 (×2): qty 4

## 2024-07-13 MED ORDER — FENTANYL 2500MCG IN NS 250ML (10MCG/ML) PREMIX INFUSION: 0.0000 ug/h | INTRAVENOUS | Status: DC

## 2024-07-13 NOTE — Progress Notes (Signed)
 PHARMACY - ANTICOAGULATION CONSULT NOTE  Pharmacy Consult:  Bivalirudin  Indication: atrial fibrillation  Allergies  Allergen Reactions   Ace Inhibitors Swelling   Diphenhydramine Other (See Comments)    Hyperactive/jittery    Doxycycline Hyclate Nausea And Vomiting   Codeine Nausea And Vomiting    Patient Measurements: Height: 5' 1 (154.9 cm) Weight: 68 kg (150 lb) IBW/kg (Calculated) : 47.8 HEPARIN DW (KG): 63.1  Vital Signs: Temp: 96.5 F (35.8 C) (11/08 1200) Temp Source: Axillary (11/08 1200) BP: 140/44 (11/08 0758) Pulse Rate: 96 (11/08 0814)  Labs: Recent Labs    07/11/24 0228 07/12/24 0841 07/13/24 0335 07/13/24 0800  HGB 8.9* 8.3* 7.3*  --   HCT 30.5* 28.8* 26.2*  --   PLT 416* 340 348  --   APTT  --   --  65* 72*  CREATININE 0.65 0.65 0.79  --     Estimated Creatinine Clearance: 53.6 mL/min (by C-G formula based on SCr of 0.79 mg/dL).   Medical History: Past Medical History:  Diagnosis Date   Abnormal finding on EKG 09/19/2013   Acute on chronic respiratory failure with hypercapnia (HCC)    Acute respiratory failure (HCC) 06/08/2016   Anxiety 11/09/2020   Arthritis    Asthma    Carotid bruit 11/09/2020   Chronic diastolic CHF (congestive heart failure) (HCC) 07/28/2017   Chronic obstructive pulmonary disease, unspecified (HCC) 11/09/2020   COPD, group D, by GOLD 2017 classification (HCC) 09/17/2013   Cough 11/09/2020   Decreased estrogen level 11/09/2020   Diabetes (HCC)    Dyspnea    Edema 11/09/2020   Essential hypertension 09/18/2013   Goals of care, counseling/discussion    Hardening of the aorta (main artery of the heart) 11/09/2020   Heart disease    History of radiation therapy    Left Lung- 06/20/23-06/26/23- Dr. 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  She was previously on IV heparin and was switched to bivalirudin due to labile heparin levels.  Med was stopped because it was thought that Afib was secondary to critical illness, which resolved. However patient went back into Afib w/ RVR on 11/7.   CBC stable; no bleeding reported.  aPTT therapeutic at 72.  Noted last prophylactic Lovenox  dose given on 11/7 at 1201.  Goal of Therapy:  aPTT 50-85 seconds Monitor platelets by anticoagulation protocol: Yes   Plan:  Continue bivalirudin at 0.15 mg/kg/hr - patient has had therapeutic readings x2 since resumption of bivalirudin. Will space out aPTT checks to q12h.  Daily aPTT and CBC  Powell Blush, PharmD, BCCCP  07/13/2024, 1:39 PM

## 2024-07-13 NOTE — Procedures (Signed)
 Insertion of Chest Tube Procedure Note  Katie Waller  [id number]  Oct 26, 1945  Date:07/13/24  Time:5:56 PM    Provider Performing: Toribio [name]   Procedure: Pleural Catheter Insertion w/ Imaging Guidance (67442)  Indication(s) Effusion  Consent Risks of the procedure as well as the alternatives and risks of each were explained to the patient and/or caregiver.  Consent for the procedure was obtained and is signed in the bedside chart  Anesthesia Topical only with 1% lidocaine     Time Out Verified patient identification, verified procedure, site/side was marked, verified correct patient position, special equipment/implants available, medications/allergies/relevant history reviewed, required imaging and test results available.   Sterile Technique Maximal sterile technique including full sterile barrier drape, hand hygiene, sterile gown, sterile gloves, mask, hair covering, sterile ultrasound probe cover (if used).   Procedure Description Ultrasound used to identify appropriate pleural anatomy for placement and overlying skin marked. Area of placement cleaned and draped in sterile fashion.  A 14 French pigtail pleural catheter was placed into the right pleural space using Seldinger technique. Appropriate return of fluid was obtained.  The tube was connected to atrium and placed on -20 cm H2O wall suction.   Complications/Tolerance None; patient tolerated the procedure well. Chest X-ray is ordered to verify placement.   EBL Minimal  Specimen(s) fluid

## 2024-07-13 NOTE — Progress Notes (Signed)
 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 Code stroke called. On arrival to ED patient emergently intubated for acute respiratory distress and airway protection. CT Head with no ICH or large infarct, CTA head and neck with no LVO. CXR concerning for a Pneumonia with visible BL lung apices on CTA demonstrating infection/inflammation. Code stroke cancelled with presentation suspicious for sepsis. Patient received 30ml/kg crystalloid, pan cultured, and started empirically on Cefepime/Vanc/Azithromycin . Of note, family reports patient has recently not felt well and likely has had a cold. PCCM consulted for ICU admission.   On exam in ED, patient febrile to >101 and hypotensive, likely multifactorial in setting of presumed sepsis and recent sedation on induction with underlying heart disease. Norepinephrine gtt was initiated for MAPs < 65 and patient was transferred to ICU with plans to gain central access for vasopressor administration and arterial line placement for hemodynamic monitoring.  Pertinent  Medical History  Worsening respiratory distress with increasing oxygen  requirement this a.m.  pCO2 on ABG 103  Significant Hospital Events: Including procedures, antibiotic start and stop dates in addition to other pertinent events   10/21 Found unresponsive at home->ED>Code stroke>intubated for ARD/airway protection>CTH/CTA negative>Code stroke cancelled>Hypotensive>started on pressors>Admit to ICU  Arterial line placed  10/21 10/22 and 10/23 unsuccessful extubation 10/24: Despite maximal Precedex and fentanyl  pushes, unable to safely and comfortably clean the patient up so added prn Versed and started fentanyl  infusion. Rectal tube added for diarrhea.  10/24: 2/3 BC resulted in staph capitis/epidermitis, extubated on BiPAP 10/25 patient remained on BiPAP for 24 hours, when she came off of BiPAP, could not tolerate became tachypneic, tachycardic went into respiratory distress requiring endotracheal reintubation 10/29 unsuccessful SBT, went briefly back into RVR 10/30 unsuccessful weaning from sedation as BP/HR increased considerably 11/2: extubated to HFNC 11/8 progressively became more hypoxic with decreased respiratory drive prompting ABG this a.m. which revealed toxic hypercapnic respiratory failure.  Transferred back to ICU for impending intubation  Interim History / Subjective:  Daughter at bedside and updated see below for goals of care discussion  Objective    Blood pressure (!) 140/44, pulse 96, temperature 97.6 F (36.4 C), temperature source Axillary, resp. rate (!) 26, height 5' 1 (1.549 m), weight 68 kg, SpO2 94%.    FiO2 (%):  [60 %-80 %] 73 %   Intake/Output Summary (Last 24 hours) at 07/13/2024 0956 Last data filed at 07/13/2024 0600 Gross per 24 hour  Intake 986.25 ml  Output 1250 ml  Net -263.75 ml   Filed Weights   07/10/24 0540 07/12/24 0300 07/13/24 0340  Weight: 64.2 kg 66.7 kg 68 kg    Examination: General: Acute on chronic ill-appearing severely deconditioned elderly female lying in bed on heated high flow HEENT: Heated high flow nasal cannula in place, MM pink/moist, PERRL,  Neuro: Eyes spontaneously open and moving upper extremities unable to follow commands CV: s1s2 regular rate and rhythm, no murmur, rubs, or gallops,  PULM: Diminished air entry bilaterally, weak ineffective cough, on 75% FiO2 and 40 L heated high flow nasal  cannula GI: soft, bowel sounds active in all  4 quadrants, non-tender, non-distended Extremities: warm/dry, no edema  Skin: no rashes or lesions  Resolved problem list  Acute COPD exacerbation Human metapneumovirus pneumonia Ileus AKI  Assessment and Plan  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  However by 11/7 patient displayed increased work of breathing and oxygen  demand prompting repeat CT chest which revealed atelectasis and complete consolidation of left lower lobe.  Of note patient is severely deconditioned with poor ineffective cough.  P: Patient has had progressive failure to thrive with poor respiratory effort over the last several days.  She has a poor ineffective cough with rapid shallow respirations and is now on 75% FiO2 40 L via heated high flow nasal cannula.  Given her severely deconditioned state she is not a BiPAP candidate Transferred to ICU for reintubation Long discussion daughter regarding likely need for tracheostomy tube if patient is reintubated Aspiration precautions As needed bronchodilators Continue cefepime and vancomycin  Afib with RVR, new onset, paroxysmal Chronic HFpEF HTN -Restarted back on amiodarone infusion 11/7 P: Continuous telemetry Optimize electrolytes Hold home antihypertensives Goal of normovolemia   Anemia- stable P: Trend CBC  Transfuse per protocol Hemoglobin goal greater than 7   Hyperglycemia P: SSI CBG checks every 4 CBG goal 140-180  Goals of care Long discussion held with daughter at bedside 11/8 patient is clinically showing signs of poor respiratory effort with inability to protect airway and her severely deconditioned state.  It is very likely  that patient gets reintubated she is going to need prolonged ventilator support and this will need to be completed via tracheostomy tube.  Daughter states she understands this and believes her mother would want these aggressive interventions if indicated.  Critical care    CRITICAL CARE Performed by: Delina Kruczek D. Harris   Total critical care time: 40 minutes  Critical care time was exclusive of separately billable procedures and treating other patients.  Critical care was necessary to treat or prevent imminent or life-threatening deterioration.  Critical care was time spent personally by me on the following activities: development of treatment plan with patient and/or surrogate as well as nursing, discussions with consultants, evaluation of patient's response to treatment, examination of patient, obtaining history from patient or surrogate, ordering and performing treatments and interventions, ordering and review of laboratory studies, ordering and review of radiographic studies, pulse oximetry and re-evaluation of patient's condition.  Raylon Lamson D. Harris, NP-C [facility] Personal contact information can be found on Amion  If no contact or response made please call [phone] 07/13/2024, 10:09 AM

## 2024-07-13 NOTE — Progress Notes (Signed)
 Upon entering pt room Rt noticed pt had audible rhonchi and increased WOB. RT spoke with pt daughter and was informed this was not her normal that she is normally feisty RT gave breathing treatments and NTS pt with a moderate return of tan thick sputum slightly pink tinged. Pt tolerated procedure well, and at that time MD came into pt room and verbalized to RT to obtain an ABG. ABG obtained and sent to lab, RT called lab prior to ABG being sent. RT to continue to monitor pt as needed.

## 2024-07-13 NOTE — Procedures (Signed)
 Thoracentesis  Procedure Note  TORYN DEWALT  [id number]  12-Feb-1946  Date:07/13/24  Time:5:54 PM   Provider Performing:Towana Stenglein C Claudene   Procedure: Thoracentesis with imaging guidance (67444)  Indication(s) Pleural Effusion  Consent Risks of the procedure as well as the alternatives and risks of each were explained to the patient and/or caregiver.  Consent for the procedure was obtained and is signed in the bedside chart  Anesthesia Topical only with 1% lidocaine     Time Out Verified patient identification, verified procedure, site/side was marked, verified correct patient position, special equipment/implants available, medications/allergies/relevant history reviewed, required imaging and test results available.   Sterile Technique Maximal sterile technique including full sterile barrier drape, hand hygiene, sterile gown, sterile gloves, mask, hair covering, sterile ultrasound probe cover (if used).  Procedure Description Ultrasound was used to identify appropriate pleural anatomy for placement and overlying skin marked.  Area of drainage cleaned and draped in sterile fashion. Lidocaine  was used to anesthetize the skin and subcutaneous tissue.  600 cc's of straw appearing fluid was drained from the left pleural space. Catheter then removed and bandaid applied to site.   Complications/Tolerance None; patient tolerated the procedure well. Chest X-ray is ordered to confirm no post-procedural complication.   EBL Minimal   Specimen(s) Pleural fluid

## 2024-07-13 NOTE — Progress Notes (Signed)
 PHARMACY - ANTICOAGULATION CONSULT NOTE  Pharmacy Consult for bivalirudin Indication: atrial fibrillation  Labs: Recent Labs    07/11/24 0228 07/12/24 0841 07/13/24 0335  HGB 8.9* 8.3* 7.3*  HCT 30.5* 28.8* 26.2*  PLT 416* 340 348  APTT  --   --  65*  CREATININE 0.65 0.65  --    Assessment/Plan:  77yo female therapeutic on bivalirudin after resumed. Will continue infusion at current rate of 0.15 mg/kg/hr and confirm stable with additional PTT.  Marvetta Dauphin, PharmD, BCPS 07/13/2024 4:03 AM

## 2024-07-13 NOTE — Progress Notes (Signed)
 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 Code stroke called. On arrival to ED patient emergently intubated for acute respiratory distress and airway protection. CT Head with no ICH or large infarct, CTA head and neck with no LVO. CXR concerning for a Pneumonia with visible BL lung apices on CTA demonstrating infection/inflammation. Code stroke cancelled with presentation suspicious for sepsis.  Patient was admitted to the intensive care unit.    Consultants: Critical care medicine  Procedures: Intubation.    Subjective/Interval History: Patient noted to be lethargic this morning with shallow respirations.  Daughter is at the bedside.  Patient not as responsive as she was yesterday.     Assessment/Plan:  Acute on chronic respiratory failure with hypoxia and hypercapnia/COPD with acute exacerbation/metapneumovirus pneumonia Superimposed bacterial pneumonia Persistent fever Patient was initially intubated.  She was extubated.   Remains on heated high flow nasal cannula. Patient recently completed course of antibiotics 11/3.  Initially on vancomycin and Zosyn and then on vancomycin and cefepime.  Patient spiked fever again on 11/6.  WBC was elevated.  Lactic acid level normal.  Procalcitonin mildly elevated.  Chest x-ray raise concern for new left base retrocardiac opacification.  Blood cultures were sent.  Patient was started back on antibiotics.  Currently on vancomycin and cefepime. Lower extremity Doppler study negative for DVT. CT scan of the chest abdomen pelvis was done on 11/7.  Shows significant  consolidation in the left lung.  Patient with worsening respiratory status this morning.  ABG shows hypercapnic respiratory failure.  She is lethargic.  Discussed with critical care medicine who will transfer her back to ICU.  She may need to be reintubated.  Right IJ thrombus This was incidentally noted on CT scan.  She had a central line in that vein when she was in the ICU during earlier part of this admission.  Patient is anticoagulated for atrial fibrillation.  A PICC line was placed in the right arm yesterday before the CT report was available.  Will need to monitor this closely.  Cholelithiasis Incidentally noted on CT scan.  LFTs are unremarkable.  Mildly elevated lipase level is noted of no clear clinical significance.  No pancreatitis noted on CT scan.  Hypotension Had low blood pressures which have improved.  Amlodipine  and ARB currently on hold.    Atrial fibrillation with RVR This is new onset.  This was treated with amiodarone in the ICU but she was taken off of it when she converted to sinus rhythm.  TSH was noted to be normal. Echocardiogram shows normal LVEF.  Small pericardial effusion noted.  Echo was repeated and shows no worsening in pericardial effusion. Patient went back into A-fib with RVR on 11/7.  She was placed back on amiodarone infusion.  Started on heparin infusion which was changed over to bivalirudin.  Hypokalemia/hypophosphatemia Continue to supplement as indicated.  Phosphorus levels have improved.    Macrocytic anemia Anemia panel shows ferritin of 762, iron 44, TIBC 227, percent saturation 19.  B12 1029 and folic acid greater than 20.   Drop in hemoglobin noted today.  No evidence of overt bleeding.  Will need to be monitored  closely.  Patient being transferred to ICU today.  Ileus Resolved.    Acute kidney injury Resolved.  DVT Prophylaxis: Lovenox  Code Status: Full code Family Communication: Discussed with daughter at bedside Disposition Plan: SNF  when medically stable    Medications: Scheduled:  arformoterol   15 mcg Nebulization BID   budesonide  (PULMICORT ) nebulizer solution  0.5 mg Nebulization BID   Chlorhexidine  Gluconate Cloth  6 each Topical Daily   feeding supplement  237 mL Oral BID BM   feeding supplement (PROSource TF20)  60 mL Per Tube Daily   fiber supplement (BANATROL TF)  60 mL Per Tube BID   insulin aspart  0-9 Units Subcutaneous Q4H   metoprolol  tartrate  2.5 mg Intravenous Q8H   revefenacin  175 mcg Nebulization Daily   sodium chloride  flush  10-40 mL Intracatheter Q12H   Continuous:  amiodarone 30 mg/hr (07/13/24 0655)   bivalirudin (ANGIOMAX) 250 mg in sodium chloride  0.9 % 500 mL (0.5 mg/mL) infusion 0.15 mg/kg/hr (07/13/24 0600)   ceFEPime (MAXIPIME) IV 2 g (07/13/24 0908)   feeding supplement (KATE [employer]ARMS STANDARD ENT 1.4) 45 mL/hr at 07/13/24 0600   vancomycin 1,000 mg (07/13/24 0904)   PRN:acetaminophen , gabapentin, ipratropium-albuterol , labetalol , LORazepam , morphine  injection, ondansetron  (ZOFRAN ) IV, mouth rinse, oxyCODONE, polyethylene glycol, simethicone, sodium chloride  flush  Antibiotics: Anti-infectives (From admission, onward)    Start     Dose/Rate Route Frequency Ordered Stop   07/11/24 0945  vancomycin (VANCOCIN) IVPB 1000 mg/200 mL premix        1,000 mg 200 mL/hr over 60 Minutes Intravenous Every 24 hours 07/11/24 0854     07/11/24 0945  ceFEPIme (MAXIPIME) 2 g in sodium chloride  0.9 % 100 mL IVPB        2 g 200 mL/hr over 30 Minutes Intravenous Every 12 hours 07/11/24 0854     07/08/24 1100  vancomycin (VANCOREADY) IVPB 750 mg/150 mL  Status:  Discontinued        750 mg 150 mL/hr over 60 Minutes Intravenous Every 24 hours 07/08/24 0951 07/08/24 1539   07/07/24 1000  vancomycin (VANCOREADY) IVPB 750 mg/150 mL  Status:  Discontinued        750 mg 150 mL/hr over 60 Minutes Intravenous Every 24 hours 07/06/24 0839 07/08/24 0951   07/06/24 0930  vancomycin (VANCOREADY) IVPB 1500  mg/300 mL        1,500 mg 150 mL/hr over 120 Minutes Intravenous  Once 07/06/24 0837 07/06/24 1327   07/06/24 0837  ceFEPIme (MAXIPIME) 2 g in sodium chloride  0.9 % 100 mL IVPB  Status:  Discontinued        2 g 200 mL/hr over 30 Minutes Intravenous Every 12 hours 07/06/24 0837 07/08/24 1539   06/30/24 1200  vancomycin (VANCOREADY) IVPB 1500 mg/300 mL  Status:  Discontinued        1,500 mg 150 mL/hr over 120 Minutes Intravenous Every 24 hours 06/29/24 1347 06/30/24 1332   06/29/24 1400  piperacillin-tazobactam (ZOSYN) IVPB 3.375 g        3.375 g 12.5 mL/hr over 240 Minutes Intravenous Every 8 hours 06/29/24 1238 07/06/24 0127   06/29/24 1330  vancomycin (VANCOREADY) IVPB 1500 mg/300 mL        1,500 mg 150 mL/hr over 120 Minutes Intravenous  Once 06/29/24 1238 06/29/24 1547   06/26/24 1015  cefTRIAXone  (ROCEPHIN ) 2 g in sodium chloride  0.9 % 100 mL IVPB        2 g 200 mL/hr over 30 Minutes Intravenous Every 24  hours 06/26/24 0929 06/29/24 1028   06/25/24 2100  ceFEPIme (MAXIPIME) 2 g in sodium chloride  0.9 % 100 mL IVPB  Status:  Discontinued        2 g 200 mL/hr over 30 Minutes Intravenous Every 12 hours 06/25/24 1259 06/26/24 0929   06/25/24 1330  azithromycin  (ZITHROMAX ) 500 mg in sodium chloride  0.9 % 250 mL IVPB        500 mg 250 mL/hr over 60 Minutes Intravenous Every 24 hours 06/25/24 1239 06/27/24 1059   06/25/24 0945  vancomycin (VANCOREADY) IVPB 1250 mg/250 mL  Status:  Discontinued        1,250 mg 166.7 mL/hr over 90 Minutes Intravenous  Once 06/25/24 0940 06/26/24 0907   06/25/24 0930  ceFEPIme (MAXIPIME) 2 g in sodium chloride  0.9 % 100 mL IVPB        2 g 200 mL/hr over 30 Minutes Intravenous  Once 06/25/24 0929 06/25/24 1043   06/25/24 0930  metroNIDAZOLE (FLAGYL) IVPB 500 mg        500 mg 100 mL/hr over 60 Minutes Intravenous  Once 06/25/24 0929 06/25/24 1202   06/25/24 0930  vancomycin (VANCOCIN) IVPB 1000 mg/200 mL premix  Status:  Discontinued        1,000 mg 200  mL/hr over 60 Minutes Intravenous  Once 06/25/24 0929 06/25/24 0940       Objective:  Vital Signs  Vitals:   07/13/24 0300 07/13/24 0340 07/13/24 0758 07/13/24 0814  BP: (!) 126/59 (!) 123/56 (!) 140/44   Pulse: (!) 102 92 96 96  Resp: (!) 26 (!) 24 (!) 26 (!) 26  Temp:  (!) 100.6 F (38.1 C) 97.6 F (36.4 C)   TempSrc:  Oral Axillary   SpO2: 94% 93% 97% 94%  Weight:  68 kg    Height:        Intake/Output Summary (Last 24 hours) at 07/13/2024 0918 Last data filed at 07/13/2024 0600 Gross per 24 hour  Intake 986.25 ml  Output 1250 ml  Net -263.75 ml   Filed Weights   07/10/24 0540 07/12/24 0300 07/13/24 0340  Weight: 64.2 kg 66.7 kg 68 kg    General appearance: Lethargic this morning. Resp: Poor effort.  Noted to be tachypneic.  Shallow respirations.  Crackles bilateral bases left more than right.   Cardio: S1-S2 is normal regular.  No S3-S4.  No rubs murmurs or bruit GI: Abdomen is soft.  Nontender nondistended.  Bowel sounds are present normal.  No masses organomegaly Lethargic.  No obvious focal neurological deficits but significant physical deconditioning is noted.   Lab Results:  Data Reviewed: I have personally reviewed following labs and reports of the imaging studies  CBC: Recent Labs  Lab 07/08/24 0533 07/09/24 0606 07/11/24 0228 07/12/24 0841 07/13/24 0335  WBC 12.6* 11.9* 11.0* 10.8* 10.0  HGB 8.4* 9.2* 8.9* 8.3* 7.3*  HCT 27.9* 31.6* 30.5* 28.8* 26.2*  MCV 97.2 100.3* 100.0 101.8* 104.8*  PLT 396 422* 416* 340 348    Basic Metabolic Panel: Recent Labs  Lab 07/08/24 1525 07/09/24 0606 07/10/24 0256 07/11/24 0228 07/12/24 0841 07/12/24 1124 07/13/24 0335  NA  --  143 141 137 141  --  138  K  --  4.3 3.3* 4.1 4.0  --  4.1  CL  --  102 98 97* 97*  --  93*  CO2  --  29 32 30 33*  --  37*  GLUCOSE  --  94 111* 100* 101*  --  259*  BUN  --  29* 24* 15 11  --  21  CREATININE  --  0.78 0.85 0.65 0.65  --  0.79  CALCIUM   --  9.1 9.0 8.8*  8.7*  --  8.7*  MG 2.3  --  2.1 2.1  --  1.9 2.1  PHOS  --   --   --  2.0* 3.7 3.5 3.3    GFR: Estimated Creatinine Clearance: 53.6 mL/min (by C-G formula based on SCr of 0.79 mg/dL).  Liver Function Tests: Recent Labs  Lab 07/08/24 0918  AST 46*  ALT 50*  ALKPHOS 42  BILITOT 0.7  PROT 5.9*  ALBUMIN 2.3*    Recent Labs  Lab 07/08/24 0918 07/09/24 0606  LIPASE 165* 164*   BNP (last 3 results) Recent Labs    03/26/24 1105  PROBNP 142.0   CBG: Recent Labs  Lab 07/12/24 1108 07/12/24 1626 07/12/24 1950 07/12/24 2344 07/13/24 0338  GLUCAP 158* 160* 184* 214* 189*    Recent Results (from the past 240 hours)  MRSA Next Gen by PCR, Nasal     Status: None   Collection Time: 07/06/24  8:16 AM   Specimen: Nasal Mucosa; Nasal Swab  Result Value Ref Range Status   MRSA by PCR Next Gen NOT DETECTED NOT DETECTED Final    Comment: (NOTE) The GeneXpert MRSA Assay (FDA approved for NASAL specimens only), is one component of a comprehensive MRSA colonization surveillance program. It is not intended to diagnose MRSA infection nor to guide or monitor treatment for MRSA infections. Test performance is not FDA approved in patients less than 5 years old. Performed at [facility], [address] [place], [place] [address]   Culture, Respiratory w Gram Stain     Status: None   Collection Time: 07/06/24 11:35 AM   Specimen: Tracheal Aspirate; Respiratory  Result Value Ref Range Status   Specimen Description TRACHEAL ASPIRATE  Final   Special Requests NONE  Final   Gram Stain   Final    NO WBC SEEN RARE YEAST Performed at [facility], [address], [place], [place] [address]    Culture FEW CANDIDA ALBICANS  Final   Report Status 07/08/2024 FINAL  Final  Culture, blood (Routine X 2) w Reflex to ID Panel     Status: None (Preliminary result)   Collection Time: 07/11/24 10:19 AM   Specimen: BLOOD  Result Value Ref Range Status   Specimen Description BLOOD  BLOOD LEFT ARM  Final   Special Requests   Final    BOTTLES DRAWN AEROBIC AND ANAEROBIC Blood Culture results may not be optimal due to an inadequate volume of blood received in culture bottles   Culture   Final    NO GROWTH 2 DAYS Performed at [facility], [address] [place], [place] [address]    Report Status PENDING  Incomplete  Culture, blood (Routine X 2) w Reflex to ID Panel     Status: None (Preliminary result)   Collection Time: 07/11/24 10:19 AM   Specimen: BLOOD  Result Value Ref Range Status   Specimen Description BLOOD BLOOD LEFT ARM  Final   Special Requests   Final    BOTTLES DRAWN AEROBIC ONLY Blood Culture adequate volume   Culture   Final    NO GROWTH 2 DAYS Performed at [facility], [address] [place], [place] [address]    Report Status PENDING  Incomplete  Radiology Studies: CT CHEST ABDOMEN PELVIS W CONTRAST Result Date: 07/12/2024 EXAM: CT CHEST, ABDOMEN AND PELVIS WITH CONTRAST 07/12/2024 01:30:00 PM TECHNIQUE: CT of the chest, abdomen and pelvis was performed with the administration of intravenous contrast. Multiplanar reformatted images are provided for review. Automated exposure control, iterative reconstruction, and/or weight based adjustment of the mA/kV was utilized to reduce the radiation dose to as low as reasonably achievable. CONTRAST: 60 mL of Omnipaque  350. COMPARISON: CT chest 10/31/2021 and PET CT 05/29/2022. CLINICAL HISTORY: Sepsis, persistent fever, abdominal pain, rule out occult infection. FINDINGS: CHEST: MEDIASTINUM AND LYMPH NODES: Heart: Trace pericardial fluid. Coronary arteries have atherosclerotic calcifications. Pericardium: Trace pericardial fluid. The central airways are clear. Enteric tube seen throughout nondilated esophagus. No mediastinal, hilar or axillary lymphadenopathy. Atherosclerotic calcifications of the aorta. Thrombus within the right internal jugular vein in the lower neck. LUNGS AND PLEURA: Severe  emphysema. Compressive atelectasis and consolidation of the entire left lower lobe, therefore previously identified left lower lobe nodule cannot be evaluated on this exam. Compressive atelectasis in the right lower lobe. Additional patchy airspace disease in the posterior aspect of the left upper lobe. Ground glass nodular density in the left upper lobe measuring 8 mm (image 4/39) which has increased in size. New small bilateral pleural effusions, left greater than right. No pneumothorax. ABDOMEN AND PELVIS: LIVER: Scattered rounded hypodensities throughout the liver which are too small to characterize and unchanged, probable cysts or hemangiomas. GALLBLADDER AND BILE DUCTS: Gallstones are present. No biliary ductal dilatation. SPLEEN: No acute abnormality. PANCREAS: No acute abnormality. ADRENAL GLANDS: No acute abnormality. KIDNEYS, URETERS AND BLADDER: No stones in the kidneys or ureters. No hydronephrosis. No perinephric or periureteral stranding. Urinary bladder is unremarkable. GI AND BOWEL: NG tip is in the body of the stomach. The stomach remains moderately distended with air-fluid level. There is no bowel obstruction. Diffuse colonic diverticulosis. Scattered air-fluid levels throughout the colon. The appendix appears normal. REPRODUCTIVE ORGANS: No acute abnormality. PERITONEUM AND RETROPERITONEUM: No ascites. No free air. VASCULATURE: Severe atherosclerotic calcifications of the aorta and iliac arteries. ABDOMINAL AND PELVIS LYMPH NODES: No lymphadenopathy. BONES AND SOFT TISSUES: No acute osseous abnormality. Subcutaneous density in the right inguinal region is new from prior measuring 2.3 x 1.1 cm. Small fat-containing umbilical hernia. Injection sites noted in the anterior abdominal wall. Lumbar subcutaneous edema appears unchanged. IMPRESSION: 1. Thrombus within the right internal jugular vein in the lower neck. 2. Compressive atelectasis and consolidation of the entire left lower lobe, obscuring  evaluation of the previously identified left lower lobe nodule. 3. Ground-glass nodular density in the left upper lobe measuring 8 mm, increased in size; consider non-contrast chest CT at 3 months, PET/CT, or tissue sampling per Fleischner Society Guidelines for single nodules 8.120 mm. 4. New small bilateral pleural effusions, left greater than right. 5. Severe emphysema. 6. Gallstones without biliary ductal dilatation. 7. Diffuse colonic diverticulosis without evidence of diverticulitis. Electronically signed by: Greig Pique MD 07/12/2024 09:33 PM EST RP Workstation: [id number]   US  EKG SITE RITE Result Date: 07/12/2024 If Site Rite image not attached, placement could not be confirmed due to current cardiac rhythm.  VAS US  LOWER EXTREMITY VENOUS (DVT) Result Date: 07/12/2024  Lower Venous DVT Study Patient Name:  Katie Waller  Date of Exam:   07/11/2024 Medical Rec #: [id number]         Accession #:    [id number] Date of Birth: 1946/05/06        Patient Gender: F Patient Age:  77 years Exam Location:  [facility] Procedure:      VAS US  LOWER EXTREMITY VENOUS (DVT) Referring Phys: JOETTE PEBBLES --------------------------------------------------------------------------------  Indications: Swelling, Edema, stroke, and hypoxia, respiratory failure.  Comparison Study: Previous study on 11.26.2018. Performing Technologist: Edilia Elden Appl  Examination Guidelines: A complete evaluation includes B-mode imaging, spectral Doppler, color Doppler, and power Doppler as needed of all accessible portions of each vessel. Bilateral testing is considered an integral part of a complete examination. Limited examinations for reoccurring indications may be performed as noted. The reflux portion of the exam is performed with the patient in reverse Trendelenburg.  +---------+---------------+---------+-----------+----------+--------------+ RIGHT    CompressibilityPhasicitySpontaneityPropertiesThrombus Aging  +---------+---------------+---------+-----------+----------+--------------+ CFV      Full           Yes      Yes                                 +---------+---------------+---------+-----------+----------+--------------+ SFJ      Full           Yes      Yes                                 +---------+---------------+---------+-----------+----------+--------------+ FV Prox  Full                                                        +---------+---------------+---------+-----------+----------+--------------+ FV Mid   Full                                                        +---------+---------------+---------+-----------+----------+--------------+ FV DistalFull                                                        +---------+---------------+---------+-----------+----------+--------------+ PFV      Full                                                        +---------+---------------+---------+-----------+----------+--------------+ POP      Full           Yes      Yes                                 +---------+---------------+---------+-----------+----------+--------------+ PTV      Full                                                        +---------+---------------+---------+-----------+----------+--------------+ PERO     Full                                                        +---------+---------------+---------+-----------+----------+--------------+   +---------+---------------+---------+-----------+----------+--------------+  LEFT     CompressibilityPhasicitySpontaneityPropertiesThrombus Aging +---------+---------------+---------+-----------+----------+--------------+ CFV      Full           Yes      Yes                                 +---------+---------------+---------+-----------+----------+--------------+ SFJ      Full           Yes      Yes                                  +---------+---------------+---------+-----------+----------+--------------+ FV Prox  Full                                                        +---------+---------------+---------+-----------+----------+--------------+ FV Mid   Full                                                        +---------+---------------+---------+-----------+----------+--------------+ FV DistalFull                                                        +---------+---------------+---------+-----------+----------+--------------+ PFV      Full                                                        +---------+---------------+---------+-----------+----------+--------------+ POP      Full           Yes      Yes                                 +---------+---------------+---------+-----------+----------+--------------+ PTV      Full                                                        +---------+---------------+---------+-----------+----------+--------------+ PERO     Full                                                        +---------+---------------+---------+-----------+----------+--------------+     Summary: BILATERAL: - No evidence of deep vein thrombosis seen in the lower extremities, bilaterally. -No evidence of popliteal cyst, bilaterally.   *See table(s) above for measurements and observations. Electronically signed by Lonni Gaskins MD on 07/12/2024 at 9:36:05 AM.    Final  LOS: 18 days   Katie Waller [employer] on [email or web address]  07/13/2024, 9:18 AM

## 2024-07-13 NOTE — Progress Notes (Signed)
 Pharmacy Antibiotic Note  Katie Waller is a 78 y.o. female admitted on 06/25/2024 with sepsis.  Pharmacy has been consulted for vanc/cefepime dosing. Patient with recent pneumonia treatment, however patient de-compensated 11/8 requiring transfer back to the ICU.  Plan: Vanc 1g IV q24>>AUC 481, Scr 0.79 rounded to 0.8 . Will consider getting levels in the next 24-48 hours if patient remains on therapy.  Meropenem 1g q8h.  Follow culture data for de-escalation.  Monitor renal function for dose adjustments as indicated.   Height: 5' 1 (154.9 cm) Weight: 68 kg (150 lb) IBW/kg (Calculated) : 47.8  Temp (24hrs), Avg:99 F (37.2 C), Min:96.5 F (35.8 C), Max:100.9 F (38.3 C)  Recent Labs  Lab 07/08/24 0533 07/09/24 0606 07/10/24 0256 07/11/24 0228 07/11/24 1019 07/12/24 0841 07/13/24 0335  WBC 12.6* 11.9*  --  11.0*  --  10.8* 10.0  CREATININE 0.79 0.78 0.85 0.65  --  0.65 0.79  LATICACIDVEN  --   --   --   --  0.7  --   --     Estimated Creatinine Clearance: 53.6 mL/min (by C-G formula based on SCr of 0.79 mg/dL).    Allergies  Allergen Reactions   Ace Inhibitors Swelling   Diphenhydramine Other (See Comments)    Hyperactive/jittery    Doxycycline Hyclate Nausea And Vomiting   Codeine Nausea And Vomiting    Antimicrobials this admission: Zosyn 10/26 >>10/31 CRO 10/22>10/24 Cefepime 10/21; 11/1> 11/3 11/6>> Azithro 10/21>10/23 Flagyl 10/21 x1 Vanc 10/21 x1; 11/2> 11/3 11/6>>  Dose adjustments this admission:   Microbiology results: 11/6 blood>> 11/1 MRSA PCR > neg 11/1 Resp Cx >few candida albicans 10/26 tracheal aspirate: rare fungus (mold), likely contaminant; contact micro if further ID needed 10/24 Bcx: contaminant (staph epi, staph cap) 10/22: metapneumovirus (+) 10/22: legionella (-) 10/21 BCX: BCID staph 10/21 MRSA (-) 10/21 Resp panel (-)  Powell Blush, PharmD, BCCCP  07/13/2024 2:48 PM

## 2024-07-13 NOTE — Progress Notes (Addendum)
 Angiomax stopped at 1242 per CCM.  Restarted @ 1605 per CCM

## 2024-07-13 NOTE — Procedures (Signed)
 Got a little more awake on BIPAP but still not clearing secretions well, NTS now turning bloody, continues to do poorly.  Unfortunately we are stuck proceeding with ETT and trach.  Intubation Procedure Note  Katie Waller  [id number]  06/17/1946  Date:07/13/24  Time:5:53 PM   Provider Performing:Katie Waller    Procedure: Intubation (31500)  Indication(s) Respiratory Failure  Consent Verbal daughter   Anesthesia Etomidate   Time Out Verified patient identification, verified procedure, site/side was marked, verified correct patient position, special equipment/implants available, medications/allergies/relevant history reviewed, required imaging and test results available.   Sterile Technique Usual hand hygeine, masks, and gloves were used   Procedure Description Patient positioned in bed supine.  Sedation given as noted above.  Patient was intubated with endotracheal tube using Glidescope.  View was Grade 1 full glottis .  Number of attempts was 1.  Colorimetric CO2 detector was consistent with tracheal placement.   Complications/Tolerance None; patient tolerated the procedure well. Chest X-ray is ordered to verify placement.   EBL Minimal   Specimen(s) None

## 2024-07-14 ENCOUNTER — Inpatient Hospital Stay (HOSPITAL_COMMUNITY)

## 2024-07-14 DIAGNOSIS — R6521 Severe sepsis with septic shock: Secondary | ICD-10-CM | POA: Diagnosis not present

## 2024-07-14 DIAGNOSIS — J9622 Acute and chronic respiratory failure with hypercapnia: Secondary | ICD-10-CM | POA: Diagnosis not present

## 2024-07-14 DIAGNOSIS — J9621 Acute and chronic respiratory failure with hypoxia: Secondary | ICD-10-CM | POA: Diagnosis not present

## 2024-07-14 DIAGNOSIS — I480 Paroxysmal atrial fibrillation: Secondary | ICD-10-CM | POA: Diagnosis not present

## 2024-07-14 DIAGNOSIS — I5032 Chronic diastolic (congestive) heart failure: Secondary | ICD-10-CM | POA: Diagnosis not present

## 2024-07-14 DIAGNOSIS — A419 Sepsis, unspecified organism: Secondary | ICD-10-CM | POA: Diagnosis not present

## 2024-07-14 LAB — COMPREHENSIVE METABOLIC PANEL WITH GFR
ALT: 21 U/L (ref 0–44)
AST: 30 U/L (ref 15–41)
Albumin: 3 g/dL — ABNORMAL LOW (ref 3.5–5.0)
Alkaline Phosphatase: 37 U/L — ABNORMAL LOW (ref 38–126)
Anion gap: 14 (ref 5–15)
BUN: 39 mg/dL — ABNORMAL HIGH (ref 8–23)
CO2: 32 mmol/L (ref 22–32)
Calcium: 9.5 mg/dL (ref 8.9–10.3)
Chloride: 93 mmol/L — ABNORMAL LOW (ref 98–111)
Creatinine, Ser: 1.44 mg/dL — ABNORMAL HIGH (ref 0.44–1.00)
GFR, Estimated: 38 mL/min — ABNORMAL LOW (ref >60)
Glucose, Bld: 201 mg/dL — ABNORMAL HIGH (ref 70–99)
Potassium: 3.8 mmol/L (ref 3.5–5.1)
Sodium: 139 mmol/L (ref 135–145)
Total Bilirubin: 0.5 mg/dL (ref 0.0–1.2)
Total Protein: 5.6 g/dL — ABNORMAL LOW (ref 6.5–8.1)

## 2024-07-14 LAB — GLUCOSE, CAPILLARY
Glucose-Capillary: 166 mg/dL — ABNORMAL HIGH (ref 70–99)
Glucose-Capillary: 123 mg/dL — ABNORMAL HIGH (ref 70–99)
Glucose-Capillary: 102 mg/dL — ABNORMAL HIGH (ref 70–99)
Glucose-Capillary: 103 mg/dL — ABNORMAL HIGH (ref 70–99)
Glucose-Capillary: 100 mg/dL — ABNORMAL HIGH (ref 70–99)
Glucose-Capillary: 125 mg/dL — ABNORMAL HIGH (ref 70–99)

## 2024-07-14 LAB — CBC
HCT: 21.9 % — ABNORMAL LOW (ref 36.0–46.0)
HCT: 29.9 % — ABNORMAL LOW (ref 36.0–46.0)
HCT: 30.7 % — ABNORMAL LOW (ref 36.0–46.0)
Hemoglobin: 6.2 g/dL — CL (ref 12.0–15.0)
Hemoglobin: 9.6 g/dL — ABNORMAL LOW (ref 12.0–15.0)
Hemoglobin: 9.7 g/dL — ABNORMAL LOW (ref 12.0–15.0)
MCH: 29.4 pg (ref 26.0–34.0)
MCH: 31.2 pg (ref 26.0–34.0)
MCH: 30.6 pg (ref 26.0–34.0)
MCHC: 28.3 g/dL — ABNORMAL LOW (ref 30.0–36.0)
MCHC: 32.1 g/dL (ref 30.0–36.0)
MCHC: 31.6 g/dL (ref 30.0–36.0)
MCV: 103.8 fL — ABNORMAL HIGH (ref 80.0–100.0)
MCV: 97.1 fL (ref 80.0–100.0)
MCV: 96.8 fL (ref 80.0–100.0)
Platelets: 278 K/uL (ref 150–400)
Platelets: 236 K/uL (ref 150–400)
Platelets: 222 K/uL (ref 150–400)
RBC: 2.11 MIL/uL — ABNORMAL LOW (ref 3.87–5.11)
RBC: 3.08 MIL/uL — ABNORMAL LOW (ref 3.87–5.11)
RBC: 3.17 MIL/uL — ABNORMAL LOW (ref 3.87–5.11)
RDW: 13.6 % (ref 11.5–15.5)
RDW: 14.6 % (ref 11.5–15.5)
RDW: 14.9 % (ref 11.5–15.5)
WBC: 10.4 K/uL (ref 4.0–10.5)
WBC: 10.4 K/uL (ref 4.0–10.5)
WBC: 10.9 K/uL — ABNORMAL HIGH (ref 4.0–10.5)
nRBC: 0.7 % — ABNORMAL HIGH (ref 0.0–0.2)
nRBC: 1.2 % — ABNORMAL HIGH (ref 0.0–0.2)
nRBC: 0.7 % — ABNORMAL HIGH (ref 0.0–0.2)

## 2024-07-14 LAB — APTT: aPTT: 67 s — ABNORMAL HIGH (ref 24–36)

## 2024-07-14 LAB — HEMOGLOBIN AND HEMATOCRIT, BLOOD
HCT: 20.7 % — ABNORMAL LOW (ref 36.0–46.0)
Hemoglobin: 6 g/dL — CL (ref 12.0–15.0)

## 2024-07-14 LAB — ECHOCARDIOGRAM LIMITED
Height: 61 in
S' Lateral: 3.1 cm
Weight: 2659.63 [oz_av]

## 2024-07-14 LAB — TRIGLYCERIDES: Triglycerides: 92 mg/dL (ref <150)

## 2024-07-14 LAB — MAGNESIUM: Magnesium: 2.4 mg/dL (ref 1.7–2.4)

## 2024-07-14 LAB — ABO/RH: ABO/RH(D): A POS

## 2024-07-14 LAB — PHOSPHORUS: Phosphorus: 2 mg/dL — ABNORMAL LOW (ref 2.5–4.6)

## 2024-07-14 LAB — BRAIN NATRIURETIC PEPTIDE: B Natriuretic Peptide: 1555.3 pg/mL — ABNORMAL HIGH (ref 0.0–100.0)

## 2024-07-14 LAB — PREPARE RBC (CROSSMATCH)

## 2024-07-14 MED ORDER — MIDAZOLAM HCL (PF) 2 MG/2ML IJ SOLN
5.0000 mg | Freq: Once | INTRAMUSCULAR | Status: AC
  Administered 2024-07-14: 2 mg via INTRAVENOUS
  Filled 2024-07-14: qty 6

## 2024-07-14 MED ORDER — LIDOCAINE-EPINEPHRINE 1 %-1:100000 IJ SOLN
20.0000 mL | Freq: Once | INTRAMUSCULAR | Status: DC
  Filled 2024-07-14: qty 1

## 2024-07-14 MED ORDER — K PHOS MONO-SOD PHOS DI & MONO 155-852-130 MG PO TABS
500.0000 mg | ORAL_TABLET | ORAL | Status: AC
  Administered 2024-07-14 (×3): 500 mg
  Filled 2024-07-14 (×3): qty 2

## 2024-07-14 MED ORDER — ORAL CARE MOUTH RINSE: 15.0000 mL | OROMUCOSAL | Status: DC | PRN

## 2024-07-14 MED ORDER — AMIODARONE LOAD VIA INFUSION
150.0000 mg | Freq: Once | INTRAVENOUS | Status: AC
  Administered 2024-07-14: 150 mg via INTRAVENOUS

## 2024-07-14 MED ORDER — ETOMIDATE 2 MG/ML IV SOLN
20.0000 mg | Freq: Once | INTRAVENOUS | Status: AC
  Administered 2024-07-14: 20 mg via INTRAVENOUS
  Filled 2024-07-14: qty 10

## 2024-07-14 MED ORDER — SODIUM CHLORIDE 0.9 % IV SOLN
1.0000 g | Freq: Two times a day (BID) | INTRAVENOUS | Status: DC
  Administered 2024-07-14: 1 g via INTRAVENOUS
  Filled 2024-07-14 (×2): qty 20

## 2024-07-14 MED ORDER — MIDAZOLAM HCL (PF) 2 MG/2ML IJ SOLN
2.0000 mg | Freq: Once | INTRAMUSCULAR | Status: AC
  Administered 2024-07-14: 2 mg via INTRAVENOUS

## 2024-07-14 MED ORDER — MAGNESIUM SULFATE 4 GM/100ML IV SOLN
4.0000 g | Freq: Once | INTRAVENOUS | Status: AC
  Administered 2024-07-14: 4 g via INTRAVENOUS
  Filled 2024-07-14: qty 100

## 2024-07-14 MED ORDER — SODIUM CHLORIDE 0.9% IV SOLUTION: Freq: Once | INTRAVENOUS | Status: DC

## 2024-07-14 MED ORDER — LACTATED RINGERS IV BOLUS
1000.0000 mL | Freq: Once | INTRAVENOUS | Status: AC
  Administered 2024-07-14: 1000 mL via INTRAVENOUS

## 2024-07-14 MED ORDER — PANTOPRAZOLE SODIUM 40 MG IV SOLR
40.0000 mg | Freq: Two times a day (BID) | INTRAVENOUS | Status: DC
  Administered 2024-07-14 – 2024-07-16 (×5): 40 mg via INTRAVENOUS
  Filled 2024-07-14 (×5): qty 10

## 2024-07-14 MED ORDER — VANCOMYCIN HCL IN DEXTROSE 1-5 GM/200ML-% IV SOLN: 1000.0000 mg | INTRAVENOUS | Status: DC

## 2024-07-14 MED ORDER — ORAL CARE MOUTH RINSE
15.0000 mL | OROMUCOSAL | Status: DC
  Administered 2024-07-14 – 2024-07-31 (×204): 15 mL via OROMUCOSAL

## 2024-07-14 MED ORDER — FENTANYL CITRATE (PF) 50 MCG/ML IJ SOSY
200.0000 ug | PREFILLED_SYRINGE | Freq: Once | INTRAMUSCULAR | Status: AC
  Administered 2024-07-14: 50 ug via INTRAVENOUS
  Filled 2024-07-14: qty 4

## 2024-07-14 MED ORDER — ROCURONIUM BROMIDE 10 MG/ML (PF) SYRINGE
100.0000 mg | PREFILLED_SYRINGE | Freq: Once | INTRAVENOUS | Status: AC
  Administered 2024-07-14: 100 mg via INTRAVENOUS
  Filled 2024-07-14: qty 10

## 2024-07-14 NOTE — Progress Notes (Signed)
 PHARMACY - ANTICOAGULATION CONSULT NOTE  Pharmacy Consult:  Bivalirudin  Indication: atrial fibrillation and internal jugular thombus   Allergies  Allergen Reactions   Ace Inhibitors Swelling   Diphenhydramine Other (See Comments)    Hyperactive/jittery    Doxycycline Hyclate Nausea And Vomiting   Codeine Nausea And Vomiting    Patient Measurements: Height: 5' 1 (154.9 cm) Weight: 75.4 kg (166 lb 3.6 oz) IBW/kg (Calculated) : 47.8 HEPARIN DW (KG): 63.1  Vital Signs: Temp: 98.6 F (37 C) (11/09 0800) Temp Source: Axillary (11/09 0800) BP: 127/72 (11/09 0759) Pulse Rate: 109 (11/09 0700)  Labs: Recent Labs    07/12/24 0841 07/13/24 0335 07/13/24 0800 07/13/24 2135 07/14/24 0529 07/14/24 0727  HGB 8.3* 7.3*  --  7.8* 6.2* 6.0*  HCT 28.8* 26.2*  --  23.0* 21.9* 20.7*  PLT 340 348  --   --  278  --   APTT  --  65* 72*  --  67*  --   CREATININE 0.65 0.79  --   --  1.44*  --     Estimated Creatinine Clearance: 31.3 mL/min (A) (by C-G formula based on SCr of 1.44 mg/dL (H)).   Medical History: Past Medical History:  Diagnosis Date   Abnormal finding on EKG 09/19/2013   Acute on chronic respiratory failure with hypercapnia (HCC)    Acute respiratory failure (HCC) 06/08/2016   Anxiety 11/09/2020   Arthritis    Asthma    Carotid bruit 11/09/2020   Chronic diastolic CHF (congestive heart failure) (HCC) 07/28/2017   Chronic obstructive pulmonary disease, unspecified (HCC) 11/09/2020   COPD, group D, by GOLD 2017 classification (HCC) 09/17/2013   Cough 11/09/2020   Decreased estrogen level 11/09/2020   Diabetes (HCC)    Dyspnea    Edema 11/09/2020   Essential hypertension 09/18/2013   Goals of care, counseling/discussion    Hardening of the aorta (main artery of the heart) 11/09/2020   Heart disease    History of radiation therapy    Left Lung- 06/20/23-06/26/23- Dr. 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  She was previously on IV heparin and was switched to bivalirudin due to labile heparin levels.  Med was stopped because it was thought that Afib was secondary to critical illness, which resolved. However patient went back into Afib w/ RVR on 11/7.   HgB downtrending over past 48 hours, down to 6.2 this AM with repeat at 6.0. No clear source of bleeding.   Goal of Therapy:  aPTT 50-85 seconds Monitor platelets by anticoagulation protocol: Yes   Plan:  Hold bivalirudin for 24-48 hours per CCM.  F/u daily about resuming.  Daily CBC  Powell Blush, PharmD, BCCCP  07/14/2024, 8:30 AM

## 2024-07-14 NOTE — Progress Notes (Signed)
 PHARMACY NOTE:  ANTIMICROBIAL RENAL DOSAGE ADJUSTMENT  Current antimicrobial regimen includes a mismatch between antimicrobial dosage and estimated renal function.  As per policy approved by the Pharmacy & Therapeutics and Medical Executive Committees, the antimicrobial dosage will be adjusted accordingly.  Current antimicrobial dosage:  meropenem 1g q8h, vancomycin 1000mg  q24h eAUC with currentl renal fxn 1044   Indication: sepsis, pneumonia   Renal Function:  Estimated Creatinine Clearance: 31.3 mL/min (A) (by C-G formula based on SCr of 1.44 mg/dL (H)). []      On intermittent HD, scheduled: []      On CRRT    Antimicrobial dosage has been changed to:  meropenem 1g q12h, vancomycin 1000mg  q48h for eAUC 522   Additional comments:   Thank you for allowing pharmacy to be a part of this patient's care.  Katie Waller, [facility] 07/14/2024 1:53 PM

## 2024-07-14 NOTE — Progress Notes (Addendum)
 Pt did not void and was bladder scanned resulting in 842 ml, In and Out was done, yielding 1000 ml per protocol, CCM made aware

## 2024-07-14 NOTE — Progress Notes (Signed)
 Hemoglobin 6.2, Ccm made aware

## 2024-07-14 NOTE — Progress Notes (Signed)
 SLP Cancellation Note  Patient Details Name: Katie Waller MRN: [id number] DOB: 1946-02-16   Cancelled treatment:       Reason Eval/Treat Not Completed: Patient with new tracheostomy. Orders for SLP eval and treat for PMSV and swallowing received. Will follow pt closely for readiness for SLP interventions as appropriate.     Damien Blumenthal, M.A., CCC-SLP Speech Language Pathology, Acute Rehabilitation Services  Secure Chat preferred [phone]  07/14/2024, 11:33 AM

## 2024-07-14 NOTE — Procedures (Signed)
 Diagnostic Bronchoscopy  Katie Waller  [id number]  August 13, 1946  Date:07/14/24  Time:12:24 PM   Provider Performing:Ruel Dimmick D. Harris   Procedure: Diagnostic Bronchoscopy (68377)  Indication(s) Assist with direct visualization of tracheostomy placement  Consent Risks of the procedure as well as the alternatives and risks of each were explained to the patient and/or caregiver.  Consent for the procedure was obtained.   Anesthesia See separate tracheostomy note   Time Out Verified patient identification, verified procedure, site/side was marked, verified correct patient position, special equipment/implants available, medications/allergies/relevant history reviewed, required imaging and test results available.   Sterile Technique Usual hand hygiene, masks, gowns, and gloves were used   Procedure Description Bronchoscope advanced through endotracheal tube and into airway.  After suctioning out tracheal secretions, bronchoscope used to provide direct visualization of tracheostomy placement. BAL completed left lower lobe thick yellow secretions removed, sample sent to lab.   Complications/Tolerance None; patient tolerated the procedure well.   EBL None  Specimen(s) None   Katie Waller D. Harris, NP-C [facility] Personal contact information can be found on Amion  If no contact or response made please call [phone] 07/14/2024, 12:25 PM

## 2024-07-14 NOTE — Procedures (Signed)
 Percutaneous Tracheostomy Procedure Note   AALIYHA MUMFORD  [id number]  01/26/1946  Date:07/14/24  Time:11:05 AM   Provider Performing:Charlann Wayne C Claudene  Procedure: Percutaneous Tracheostomy with Bronchoscopic Guidance (68399)  Indication(s) Recurrent resp failure, help wean from vent  Consent Risks of the procedure as well as the alternatives and risks of each were explained to the patient and/or caregiver.  Consent for the procedure was obtained.  Anesthesia Etomidate, Versed, Fentanyl , Vecuronium   Time Out Verified patient identification, verified procedure, site/side was marked, verified correct patient position, special equipment/implants available, medications/allergies/relevant history reviewed, required imaging and test results available.   Sterile Technique Maximal sterile technique including sterile barrier drape, hand hygiene, sterile gown, sterile gloves, mask, hair covering.    Procedure Description Appropriate anatomy identified by palpation.  Patient's neck prepped and draped in sterile fashion.  1% lidocaine  with epinephrine was used to anesthetize skin overlying neck.  1.5cm incision made and blunt dissection performed until tracheal rings could be easily palpated.   Then a size 6 Shiley tracheostomy was placed under bronchoscopic visualization using usual Seldinger technique and serial dilation.   Bronchoscope confirmed placement above the carina.  Tracheostomy was sutured in place with adhesive pad to protect skin under pressure.    Patient connected to ventilator.   Complications/Tolerance None; patient tolerated the procedure well. Chest X-ray is ordered to confirm no post-procedural complication.   EBL Minimal   Specimen(s) None

## 2024-07-14 NOTE — Progress Notes (Signed)
 eLink Physician-Brief Progress Note Patient Name: Katie Waller DOB: 1945/11/21 MRN: [id number]   Date of Service  07/14/2024  HPI/Events of Note  Urinary retention.  Bladder scan shows greater than 800 cc of urine  eICU Interventions  Straight cath ordered.     Intervention Category Minor Interventions: Other:  Jerilynn Berg 07/14/2024, 6:47 AM

## 2024-07-14 NOTE — Progress Notes (Signed)
 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 Code stroke called. On arrival to ED patient emergently intubated for acute respiratory distress and airway protection. CT Head with no ICH or large infarct, CTA head and neck with no LVO. CXR concerning for a Pneumonia with visible BL lung apices on CTA demonstrating infection/inflammation. Code stroke cancelled with presentation suspicious for sepsis. Patient received 30ml/kg crystalloid, pan cultured, and started empirically on Cefepime/Vanc/Azithromycin . Of note, family reports patient has recently not felt well and likely has had a cold. PCCM consulted for ICU admission.   On exam in ED, patient febrile to >101 and hypotensive, likely multifactorial in setting of presumed sepsis and recent sedation on induction with underlying heart disease. Norepinephrine gtt was initiated for MAPs < 65 and patient was transferred to ICU with plans to gain central access for vasopressor administration and arterial line placement for hemodynamic monitoring.  Pertinent  Medical History  Worsening respiratory distress with increasing oxygen  requirement this a.m.  pCO2 on ABG 103  Significant Hospital Events: Including procedures, antibiotic start and stop dates in addition to other pertinent events   10/21 Found unresponsive at home->ED>Code stroke>intubated for ARD/airway protection>CTH/CTA negative>Code stroke cancelled>Hypotensive>started on pressors>Admit to ICU  Arterial line placed  10/21 10/22 and 10/23 unsuccessful extubation 10/24: Despite maximal Precedex and fentanyl  pushes, unable to safely and comfortably clean the patient up so added prn Versed and started fentanyl  infusion. Rectal tube added for diarrhea.  10/24: 2/3 BC resulted in staph capitis/epidermitis, extubated on BiPAP 10/25 patient remained on BiPAP for 24 hours, when she came off of BiPAP, could not tolerate became tachypneic, tachycardic went into respiratory distress requiring endotracheal reintubation 10/29 unsuccessful SBT, went briefly back into RVR 10/30 unsuccessful weaning from sedation as BP/HR increased considerably 11/2: extubated to HFNC 11/8 progressively became more hypoxic with decreased respiratory drive prompting ABG this a.m. which revealed toxic hypercapnic respiratory failure.  Transferred back to ICU for impending intubation; tried draining pleural spaces but still tired out  Interim History / Subjective:  Post intubation yesterday PTX bigger on R, resolved with pleural tube flush No events, woke up more and moved everything after intubation. For trach today, daughter at bedside.  Objective    Blood pressure (!) 145/65, pulse 93, temperature 98 F (36.7 C), temperature source Axillary, resp. rate 20, height 5' 1 (1.549 m), weight 75.4 kg, SpO2 100%.    Vent Mode: PRVC FiO2 (%):  [40 %-100 %] 40 % Set Rate:  [20 bmp-24 bmp] 24 bmp Vt Set:  [380 mL] 380 mL PEEP:  [5 cmH20] 5 cmH20 Plateau Pressure:  [20 cmH20-30 cmH20] 22 cmH20   Intake/Output Summary (Last 24 hours) at 07/14/2024 1225 Last data filed at 07/14/2024 1200 Gross per 24 hour  Intake 5082.21 ml  Output 2150 ml  Net 2932.21 ml   Filed Weights   07/12/24 0300 07/13/24 0340 07/14/24 0500  Weight: 66.7 kg 68 kg 75.4 kg    Examination: No distress ETT small thin secretions R pigtail serous fluid, no air leak Lungs diminished  throughout, triggers vent Withdraws x 4 Abd soft Minimal edema  Hgb dropped a  bit, no obvious s/s of GIB or RP bleed Cr bump with diuretic challenge  Resolved problem list  Acute COPD exacerbation Human metapneumovirus pneumonia Ileus AKI    Assessment and Plan  Acute on chronic hypoxic & hypercapnic respiratory failure- unfortunately only path forward here is trach, attempting vent wean while we work on nutrition and rehab. Concern for recurrent bacterial infection vs aspiration- will repeat culture today Question of fluid overload- Cr bump with diuretic challenge BL effusions- drained 11/8 to try to stave off intubation, pigtail remains on R due to some concern on US  for loculation; does have PTX associated but improved.  Labs c/w transudates. 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 Afib with RVR, new onset, paroxysmal Chronic HFpEF HTN RIJ CVL associated blood clot Afib- new onset this admit, has been on bival due to issues keeping heparin therapeutic AOC anemia- unclear why dropped further, will keep an eye on and transfuse; pan-scanned 07/12/24  2 units of blood, start PPI, hold AC for now Tracheostomy R pigtail to suction for now f/u fluid cyto but likely neg Giving a little fluid back today Repeat BAL cultures during trach Amio at 30, still in fib After trach lighten sedation and work on PS trials, I think she would be good LTACH candidate for trach wean LABA/LAMA nebs Daughter updated at bedside  35 min cc time independent of procedures Rolan Sharps MD PCCM

## 2024-07-15 ENCOUNTER — Inpatient Hospital Stay (HOSPITAL_COMMUNITY)

## 2024-07-15 DIAGNOSIS — A419 Sepsis, unspecified organism: Secondary | ICD-10-CM | POA: Diagnosis not present

## 2024-07-15 DIAGNOSIS — J9621 Acute and chronic respiratory failure with hypoxia: Secondary | ICD-10-CM | POA: Diagnosis not present

## 2024-07-15 DIAGNOSIS — J9622 Acute and chronic respiratory failure with hypercapnia: Secondary | ICD-10-CM | POA: Diagnosis not present

## 2024-07-15 DIAGNOSIS — I480 Paroxysmal atrial fibrillation: Secondary | ICD-10-CM | POA: Diagnosis not present

## 2024-07-15 LAB — CBC
HCT: 31 % — ABNORMAL LOW (ref 36.0–46.0)
HCT: 31.5 % — ABNORMAL LOW (ref 36.0–46.0)
HCT: 29.9 % — ABNORMAL LOW (ref 36.0–46.0)
Hemoglobin: 9.8 g/dL — ABNORMAL LOW (ref 12.0–15.0)
Hemoglobin: 9.8 g/dL — ABNORMAL LOW (ref 12.0–15.0)
Hemoglobin: 9.5 g/dL — ABNORMAL LOW (ref 12.0–15.0)
MCH: 30.6 pg (ref 26.0–34.0)
MCH: 30.6 pg (ref 26.0–34.0)
MCH: 30.9 pg (ref 26.0–34.0)
MCHC: 31.6 g/dL (ref 30.0–36.0)
MCHC: 31.1 g/dL (ref 30.0–36.0)
MCHC: 31.8 g/dL (ref 30.0–36.0)
MCV: 96.9 fL (ref 80.0–100.0)
MCV: 98.4 fL (ref 80.0–100.0)
MCV: 97.4 fL (ref 80.0–100.0)
Platelets: 234 K/uL (ref 150–400)
Platelets: 222 K/uL (ref 150–400)
Platelets: 224 K/uL (ref 150–400)
RBC: 3.2 MIL/uL — ABNORMAL LOW (ref 3.87–5.11)
RBC: 3.2 MIL/uL — ABNORMAL LOW (ref 3.87–5.11)
RBC: 3.07 MIL/uL — ABNORMAL LOW (ref 3.87–5.11)
RDW: 15.4 % (ref 11.5–15.5)
RDW: 15.5 % (ref 11.5–15.5)
RDW: 15.5 % (ref 11.5–15.5)
WBC: 10.9 K/uL — ABNORMAL HIGH (ref 4.0–10.5)
WBC: 12.3 K/uL — ABNORMAL HIGH (ref 4.0–10.5)
WBC: 11.9 K/uL — ABNORMAL HIGH (ref 4.0–10.5)
nRBC: 0.7 % — ABNORMAL HIGH (ref 0.0–0.2)
nRBC: 0.8 % — ABNORMAL HIGH (ref 0.0–0.2)
nRBC: 0.9 % — ABNORMAL HIGH (ref 0.0–0.2)

## 2024-07-15 LAB — TYPE AND SCREEN
ABO/RH(D): A POS
Antibody Screen: NEGATIVE
Unit division: 0
Unit division: 0

## 2024-07-15 LAB — COMPREHENSIVE METABOLIC PANEL WITH GFR
ALT: 22 U/L (ref 0–44)
AST: 47 U/L — ABNORMAL HIGH (ref 15–41)
Albumin: 2.9 g/dL — ABNORMAL LOW (ref 3.5–5.0)
Alkaline Phosphatase: 38 U/L (ref 38–126)
Anion gap: 14 (ref 5–15)
BUN: 52 mg/dL — ABNORMAL HIGH (ref 8–23)
CO2: 33 mmol/L — ABNORMAL HIGH (ref 22–32)
Calcium: 8.9 mg/dL (ref 8.9–10.3)
Chloride: 92 mmol/L — ABNORMAL LOW (ref 98–111)
Creatinine, Ser: 2.1 mg/dL — ABNORMAL HIGH (ref 0.44–1.00)
GFR, Estimated: 24 mL/min — ABNORMAL LOW (ref >60)
Glucose, Bld: 136 mg/dL — ABNORMAL HIGH (ref 70–99)
Potassium: 3.9 mmol/L (ref 3.5–5.1)
Sodium: 139 mmol/L (ref 135–145)
Total Bilirubin: 0.7 mg/dL (ref 0.0–1.2)
Total Protein: 5.3 g/dL — ABNORMAL LOW (ref 6.5–8.1)

## 2024-07-15 LAB — BPAM RBC
Blood Product Expiration Date: 202511242359
Blood Product Expiration Date: 202511242359
ISSUE DATE / TIME: 202511090909
ISSUE DATE / TIME: 202511090909
Unit Type and Rh: 6200
Unit Type and Rh: 6200

## 2024-07-15 LAB — APTT
aPTT: 80 s — ABNORMAL HIGH (ref 24–36)
aPTT: 94 s — ABNORMAL HIGH (ref 24–36)

## 2024-07-15 LAB — TRIGLYCERIDES, BODY FLUIDS
Triglycerides, Fluid: <9 mg/dL
Triglycerides, Fluid: <9 mg/dL

## 2024-07-15 LAB — GLUCOSE, CAPILLARY
Glucose-Capillary: 101 mg/dL — ABNORMAL HIGH (ref 70–99)
Glucose-Capillary: 133 mg/dL — ABNORMAL HIGH (ref 70–99)
Glucose-Capillary: 161 mg/dL — ABNORMAL HIGH (ref 70–99)
Glucose-Capillary: 166 mg/dL — ABNORMAL HIGH (ref 70–99)
Glucose-Capillary: 176 mg/dL — ABNORMAL HIGH (ref 70–99)
Glucose-Capillary: 180 mg/dL — ABNORMAL HIGH (ref 70–99)
Glucose-Capillary: 264 mg/dL — ABNORMAL HIGH (ref 70–99)

## 2024-07-15 LAB — BASIC METABOLIC PANEL WITH GFR
Anion gap: 17 — ABNORMAL HIGH (ref 5–15)
BUN: 69 mg/dL — ABNORMAL HIGH (ref 8–23)
CO2: 31 mmol/L (ref 22–32)
Calcium: 8.5 mg/dL — ABNORMAL LOW (ref 8.9–10.3)
Chloride: 89 mmol/L — ABNORMAL LOW (ref 98–111)
Creatinine, Ser: 2.74 mg/dL — ABNORMAL HIGH (ref 0.44–1.00)
GFR, Estimated: 18 mL/min — ABNORMAL LOW (ref >60)
Glucose, Bld: 181 mg/dL — ABNORMAL HIGH (ref 70–99)
Potassium: 4.6 mmol/L (ref 3.5–5.1)
Sodium: 137 mmol/L (ref 135–145)

## 2024-07-15 LAB — PHOSPHORUS: Phosphorus: 6.5 mg/dL — ABNORMAL HIGH (ref 2.5–4.6)

## 2024-07-15 LAB — MAGNESIUM
Magnesium: 3.8 mg/dL — ABNORMAL HIGH (ref 1.7–2.4)
Magnesium: 3.4 mg/dL — ABNORMAL HIGH (ref 1.7–2.4)

## 2024-07-15 LAB — MRSA NEXT GEN BY PCR, NASAL: MRSA by PCR Next Gen: NOT DETECTED

## 2024-07-15 MED ORDER — FUROSEMIDE 10 MG/ML IJ SOLN
160.0000 mg | Freq: Once | INTRAVENOUS | Status: AC
  Administered 2024-07-15: 160 mg via INTRAVENOUS
  Filled 2024-07-15: qty 10

## 2024-07-15 MED ORDER — KATE FARMS STANDARD 1.4 EN LIQD
1000.0000 mL | ENTERAL | Status: AC
  Administered 2024-07-16: 1000 mL
  Filled 2024-07-15 (×2): qty 1000

## 2024-07-15 MED ORDER — SODIUM BICARBONATE 8.4 % IV SOLN
50.0000 meq | Freq: Once | INTRAVENOUS | Status: AC
  Administered 2024-07-15: 50 meq via INTRAVENOUS
  Filled 2024-07-15: qty 50

## 2024-07-15 MED ORDER — SODIUM CHLORIDE 0.9 % IV SOLN
0.1300 mg/kg/h | INTRAVENOUS | Status: DC
  Administered 2024-07-15: 0.15 mg/kg/h via INTRAVENOUS
  Filled 2024-07-15: qty 250

## 2024-07-15 MED ORDER — METOLAZONE 5 MG PO TABS
10.0000 mg | ORAL_TABLET | Freq: Once | ORAL | Status: AC
  Administered 2024-07-15: 10 mg
  Filled 2024-07-15: qty 2

## 2024-07-15 MED ORDER — FUROSEMIDE 10 MG/ML IJ SOLN
80.0000 mg | Freq: Once | INTRAMUSCULAR | Status: AC
  Administered 2024-07-15: 80 mg via INTRAVENOUS
  Filled 2024-07-15: qty 8

## 2024-07-15 MED ORDER — FUROSEMIDE 10 MG/ML IJ SOLN
240.0000 mg | Freq: Once | INTRAVENOUS | Status: AC
  Administered 2024-07-15: 240 mg via INTRAVENOUS
  Filled 2024-07-15: qty 20

## 2024-07-15 MED ORDER — SODIUM CHLORIDE 0.9 % IV SOLN
0.1000 mg/kg/h | INTRAVENOUS | Status: DC
  Filled 2024-07-15: qty 250

## 2024-07-15 NOTE — Progress Notes (Signed)
 OT Cancellation Note  Patient Details Name: Katie Waller MRN: [id number] DOB: 06/29/1946   Cancelled Treatment:    Reason Eval/Treat Not Completed: Medical issues which prohibited therapy;Patient not medically ready;OT screened, no needs identified, will sign off (increased vent needs, sedation off, not following commands. Per critical care team, OT to sign off and await new orders when medically appropriate.)  Levette Paulick D Causey 07/15/2024, 11:43 AM

## 2024-07-15 NOTE — Progress Notes (Signed)
 PCCM Brief Note  Received 80mg  Lasix  earlier this AM with only 60cc UOP.  CVP is 16. Will increase Lasix  to 160mg  x 1.   Sammi Gore, PA - C [facility] For pager details, please see AMION or use Epic chat  After 1900, please call [facility] for cross coverage needs 07/15/2024, 3:37 PM

## 2024-07-15 NOTE — Progress Notes (Addendum)
 PHARMACY - ANTICOAGULATION CONSULT NOTE  Pharmacy Consult:  Bivalirudin  Indication: atrial fibrillation and internal jugular thombus   Allergies  Allergen Reactions   Ace Inhibitors Swelling   Diphenhydramine Other (See Comments)    Hyperactive/jittery    Doxycycline Hyclate Nausea And Vomiting   Codeine Nausea And Vomiting    Patient Measurements: Height: 5' 1 (154.9 cm) Weight: 76.9 kg (169 lb 8.5 oz) IBW/kg (Calculated) : 47.8 HEPARIN DW (KG): 63.1  Vital Signs: Temp: 98.3 F (36.8 C) (11/10 1550) Temp Source: Axillary (11/10 1550) BP: 128/60 (11/10 1700) Pulse Rate: 102 (11/10 1700)  Labs: Recent Labs    07/13/24 0335 07/13/24 0800 07/13/24 2135 07/14/24 0529 07/14/24 0727 07/14/24 2205 07/15/24 0419 07/15/24 1555  HGB 7.3*  --    < > 6.2*   < > 9.7* 9.8* 9.8*  HCT 26.2*  --    < > 21.9*   < > 30.7* 31.0* 31.5*  PLT 348  --   --  278   < > 222 234 222  APTT 65* 72*  --  67*  --   --   --  80*  CREATININE 0.79  --   --  1.44*  --   --  2.10*  --    < > = values in this interval not displayed.    Estimated Creatinine Clearance: 21.7 mL/min (A) (by C-G formula based on SCr of 2.1 mg/dL (H)).   Medical History: Past Medical History:  Diagnosis Date   Abnormal finding on EKG 09/19/2013   Acute on chronic respiratory failure with hypercapnia (HCC)    Acute respiratory failure (HCC) 06/08/2016   Anxiety 11/09/2020   Arthritis    Asthma    Carotid bruit 11/09/2020   Chronic diastolic CHF (congestive heart failure) (HCC) 07/28/2017   Chronic obstructive pulmonary disease, unspecified (HCC) 11/09/2020   COPD, group D, by GOLD 2017 classification (HCC) 09/17/2013   Cough 11/09/2020   Decreased estrogen level 11/09/2020   Diabetes (HCC)    Dyspnea    Edema 11/09/2020   Essential hypertension 09/18/2013   Goals of care, counseling/discussion    Hardening of the aorta (main artery of the heart) 11/09/2020   Heart disease    History of radiation therapy     Left Lung- 06/20/23-06/26/23- Dr. 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  She was previously on IV heparin and was switched to bivalirudin due to labile heparin levels.  Med was stopped because it was thought that Afib was secondary to critical illness, which resolved. However patient went back into Afib w/ RVR on 11/7.   HgB stable this AM after transfusion of two units on 11/9. HgB 9.7 and PLTs 2344. No s/sx of bleeding. Discussed with CCM will resume bival.   2nd shift: aPTT 80. Hb 9.8, plt 222. No issues reported.  2nd shift recheck: aPTT 94 (supratherapeutic) Hb 9.5. Plt  224. No issues reported.   Goal of Therapy:  aPTT 50-85 seconds Monitor platelets by anticoagulation protocol: Yes   Plan:  Reduce bivalirudin to 0.13 mg/kg/hr. Utilized weight of 66 kg which is what weight had been used previously while patient was therapeutic on bival.  4 hour aPTT.  F/u daily about resuming.  Daily CBC  Rankin Sams, PharmD, BCPS, BCCCP Clinical Pharmacist

## 2024-07-15 NOTE — Plan of Care (Signed)
  Problem: Clinical Measurements: Goal: Ability to maintain clinical measurements within normal limits will improve Outcome: Progressing   Problem: Clinical Measurements: Goal: Will remain free from infection Outcome: Progressing   Problem: Clinical Measurements: Goal: Respiratory complications will improve Outcome: Progressing   Problem: Clinical Measurements: Goal: Cardiovascular complication will be avoided Outcome: Progressing   Problem: Nutrition: Goal: Adequate nutrition will be maintained Outcome: Progressing   Problem: Elimination: Goal: Will not experience complications related to bowel motility 07/15/2024 1738 by Rowland Hadassah RAMAN, RN Outcome: Not Progressing 07/15/2024 1738 by Rowland Hadassah RAMAN, RN Outcome: Progressing

## 2024-07-15 NOTE — Progress Notes (Signed)
 Patient still with minimal UOP despite lasix  today. Will attempt higher dose lasix  w/ metolazone and bicarb. Renal US  pending. Will likely need renal consult in am for possible HD needs.  JD Emilio RIGGERS [facility] 07/15/2024, 7:26 PM  Please see [email or web address] for pager details.  From 7A-7P if no response, please call [phone]. After hours, please call ELink [phone].

## 2024-07-15 NOTE — Progress Notes (Addendum)
 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 Code stroke called. On arrival to ED patient emergently intubated for acute respiratory distress and airway protection. CT Head with no ICH or large infarct, CTA head and neck with no LVO. CXR concerning for a Pneumonia with visible BL lung apices on CTA demonstrating infection/inflammation. Code stroke cancelled with presentation suspicious for sepsis. Patient received 30ml/kg crystalloid, pan cultured, and started empirically on Cefepime/Vanc/Azithromycin . Of note, family reports patient has recently not felt well and likely has had a cold. PCCM consulted for ICU admission.   On exam in ED, patient febrile to >101 and hypotensive, likely multifactorial in setting of presumed sepsis and recent sedation on induction with underlying heart disease. Norepinephrine gtt was initiated for MAPs < 65 and patient was transferred to ICU with plans to gain central access for vasopressor administration and arterial line placement for hemodynamic monitoring.  Pertinent  Medical History  Worsening respiratory distress with increasing oxygen  requirement this a.m.  pCO2 on ABG 103  Significant Hospital Events: Including procedures, antibiotic start and stop dates in addition to other pertinent events   10/21 Found unresponsive at home->ED>Code stroke>intubated for ARD/airway protection>CTH/CTA negative>Code stroke cancelled>Hypotensive>started on pressors>Admit to ICU  Arterial line placed  10/21 10/22 and 10/23 unsuccessful extubation 10/24: Despite maximal Precedex and fentanyl  pushes, unable to safely and comfortably clean the patient up so added prn Versed and started fentanyl  infusion. Rectal tube added for diarrhea.  10/24: 2/3 BC resulted in staph capitis/epidermitis, extubated on BiPAP 10/25 patient remained on BiPAP for 24 hours, when she came off of BiPAP, could not tolerate became tachypneic, tachycardic went into respiratory distress requiring endotracheal reintubation 10/29 unsuccessful SBT, went briefly back into RVR 10/30 unsuccessful weaning from sedation as BP/HR increased considerably 11/2: extubated to HFNC 11/8 progressively became more hypoxic with decreased respiratory drive prompting ABG this a.m. which revealed toxic hypercapnic respiratory failure.  Transferred back to ICU for impending intubation; tried draining pleural spaces but still tired out 11/10 trach 11/11 aggressive diuresis  Interim History / Subjective:  Trach yesterday, no issues Tried weaning today but needed high support Mental status a bit depressed, off sedation. Does open eyes but not follow commands Volume up, question accuracy of weights from floor. Current weight 78kg (171lbs), daughter says baseline around 150  Objective    Blood pressure (!) 150/73, pulse 94, temperature 97.6 F (36.4 C), temperature source Axillary, resp. rate (!) 25, height 5' 1 (1.549 m), weight 76.9 kg, SpO2 98%.    Vent Mode: PRVC FiO2 (%):  [40 %] 40 % Set Rate:  [20 bmp-24 bmp] 20 bmp Vt Set:  [380 mL] 380 mL PEEP:  [5 cmH20] 5 cmH20 Plateau Pressure:  [25 cmH20-26 cmH20] 25 cmH20   Intake/Output Summary (Last 24 hours) at 07/15/2024 0921 Last data filed at 07/15/2024 0900 Gross per 24 hour  Intake 2114.82 ml  Output 875 ml  Net 1239.82 ml   Filed Weights   07/13/24 0340 07/14/24 0500 07/15/24 0500  Weight: 68 kg 75.4 kg 76.9 kg  Examination: General: Adult female, resting in bed, in  NAD. Neuro: Somnolent, opens eyes to voice but doesn't follow commands. HEENT: [place]/AT. Sclerae anicteric. Trach C/D/I. Cardiovascular: RRR, no M/R/G.  Lungs: Respirations even and unlabored.  CTA bilaterally, No W/R/R. Abdomen: BS x 4, soft, NT/ND.  Musculoskeletal: No gross deformities, abdominal wall and UE edema.   Resolved problem list  Acute COPD exacerbation Human metapneumovirus pneumonia Ileus AKI   Assessment and Plan  Acute on chronic hypoxic & hypercapnic respiratory failure - s/p trach 11/9 Concern for recurrent bacterial infection vs aspiration - cultures neg thus far. Has had quite a long course of abx already Fluid overload - baseline weight per daughter around 146-150 lbs, today at 171lbs (11/10) BL effusions - drained 11/8 to try to stave off intubation, pigtail remains on R  with stable output, 110cc overnight. Labs c/w transudates. 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 CT chest/abd/pelv from 11/7 without obvious source.  - continue vent support - wean daily, will reattempt PSV wean later after lasix  challenge - routine trach care - 80mg  lasix  now, assess response and re-dose if good - avoid nephrotoxins - Get CVP - D/c abx - Clamp R chest tube, get AM CXR and pull CT if stable - LABA/LAMA nebs - Continue amio - Limit sedation - Continue Bivalirudin - Daughter updated at bedside - Might need CIR vs vent SNF depending on course   CC time: 30 min.   Sammi Gore, PA - [facility] For pager details, please see AMION or use Epic chat  After 1900, please call [facility] for cross coverage needs 07/15/2024, 10:05 AM

## 2024-07-15 NOTE — Progress Notes (Addendum)
 PHARMACY - ANTICOAGULATION CONSULT NOTE  Pharmacy Consult:  Bivalirudin  Indication: atrial fibrillation and internal jugular thombus   Allergies  Allergen Reactions   Ace Inhibitors Swelling   Diphenhydramine Other (See Comments)    Hyperactive/jittery    Doxycycline Hyclate Nausea And Vomiting   Codeine Nausea And Vomiting    Patient Measurements: Height: 5' 1 (154.9 cm) Weight: 76.9 kg (169 lb 8.5 oz) IBW/kg (Calculated) : 47.8 HEPARIN DW (KG): 63.1  Vital Signs: Temp: 97.6 F (36.4 C) (11/10 0752) Temp Source: Axillary (11/10 0752) BP: 150/73 (11/10 0900) Pulse Rate: 94 (11/10 0900)  Labs: Recent Labs    07/13/24 0335 07/13/24 0800 07/13/24 2135 07/14/24 0529 07/14/24 0727 07/14/24 1441 07/14/24 2205 07/15/24 0419  HGB 7.3*  --    < > 6.2*   < > 9.6* 9.7* 9.8*  HCT 26.2*  --    < > 21.9*   < > 29.9* 30.7* 31.0*  PLT 348  --   --  278  --  236 222 234  APTT 65* 72*  --  67*  --   --   --   --   CREATININE 0.79  --   --  1.44*  --   --   --  2.10*   < > = values in this interval not displayed.    Estimated Creatinine Clearance: 21.7 mL/min (A) (by C-G formula based on SCr of 2.1 mg/dL (H)).   Medical History: Past Medical History:  Diagnosis Date   Abnormal finding on EKG 09/19/2013   Acute on chronic respiratory failure with hypercapnia (HCC)    Acute respiratory failure (HCC) 06/08/2016   Anxiety 11/09/2020   Arthritis    Asthma    Carotid bruit 11/09/2020   Chronic diastolic CHF (congestive heart failure) (HCC) 07/28/2017   Chronic obstructive pulmonary disease, unspecified (HCC) 11/09/2020   COPD, group D, by GOLD 2017 classification (HCC) 09/17/2013   Cough 11/09/2020   Decreased estrogen level 11/09/2020   Diabetes (HCC)    Dyspnea    Edema 11/09/2020   Essential hypertension 09/18/2013   Goals of care, counseling/discussion    Hardening of the aorta (main artery of the heart) 11/09/2020   Heart disease    History of radiation therapy     Left Lung- 06/20/23-06/26/23- Dr. 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  She was previously on IV heparin and was switched to bivalirudin due to labile heparin levels.  Med was stopped because it was thought that Afib was secondary to critical illness, which resolved. However patient went back into Afib w/ RVR on 11/7.   HgB stable this AM after transfusion of two units on 11/9. HgB 9.7 and PLTs 2344. No s/sx of bleeding. Discussed with CCM will resume bival.   Goal of Therapy:  aPTT 50-85 seconds Monitor platelets by anticoagulation protocol: Yes   Plan:  Resume bivalirudin at  0.15mg /kg/hr. Utilized weight of 66 kg which is what weight had been used previously while patient was therapeutic on bival.  4 hour aPTT.  F/u daily about resuming.  Daily CBC  Powell Blush, PharmD, BCCCP  07/15/2024, 9:49 AM

## 2024-07-15 NOTE — Progress Notes (Signed)
 PT Cancellation Note  Patient Details Name: Katie Waller MRN: [id number] DOB: 30-Aug-1946   Cancelled Treatment:    Reason Eval/Treat Not Completed: PT screened, no needs identified, will sign off - pt requiring high support on vent, sedation off and not presently following commands. Spoke with critical care team, PT to sign off at present. Please reconsult when medically appropriate.   Halleigh Comes S, PT DPT Acute Rehabilitation Services Secure Chat Preferred  Office [phone]    Ean Gettel E Stroup 07/15/2024, 11:42 AM

## 2024-07-15 NOTE — Progress Notes (Addendum)
 Nutrition Follow-up  DOCUMENTATION CODES:   Not applicable  INTERVENTION:  Continue TF via Cortrak: Mallie Pinion Standard 1.4 at 45 ml/h (1080 ml per day) Prosource TF20 60 ml 1x/d Banatrol BID-provides 45kcal, 5g soluble fiber and 2g protein per serving.  Provides 1592 kcal, 87 gm protein, 767 ml free water daily  NUTRITION DIAGNOSIS:  Inadequate oral intake related to acute illness as evidenced by NPO status. - remains applicable  GOAL:  Patient will meet greater than or equal to 90% of their needs - being met via TF  MONITOR:  Vent status, Labs, Weight trends, TF tolerance  REASON FOR ASSESSMENT:  Ventilator    ASSESSMENT:   Pt admitted with AMS, sepsis, ARF. 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  10/21 admitted d/t PNA, HMPV infection; intubated; stroke/sz ruled out 10/23 - failed vent wean; initiate tube feeds 10/24 - extubated 10/25 - reintubated 11/02 - Extubated, tube feeds D/C, no access 11/03 - advanced to clear liquid diet 11/04 - advanced to full liquid diet 11/05 - transfer out of ICU 11/07 - Cortrak placed, TF started 11/08 - hypoxic; transfer to ICU (4N) 11/09 - trach placed; echo: dilated IVC and RA pressures 11/11 - failed vent wean; diuresing  Worsening hypoxia over the weekend. Transferred to ICU and trach placed. Spoke with patient's daughter this morning at bedside. Tube feeding up to goal rate. Appears patient is tolerating better than with Osmolite during last ICU stay, per daughter's report. Off sedation. Failed vent wean this morning. Being diuresed. R chest tube clamped.    Admit weight: 71 kg  Current weight: 76.9 kg - questionable accuracy; diuresing UBW: 145-150 lbs    Continues generalized edema, which could be skewing current weight trend. Worse in the lower extremities. Documented with one BM yesterday (type 6). None thus far today.  Abdomen unremarkable.    Drains/Lines:  Cortrak: gastric R basilic: PICC, double lumen (placed 11/07) R chest tube (placed 11/08): 170 ml x24 hours Tracheostomy: Shiley flexible, 6mm, cuffed Foley catheter UOP: 700 ml x24 hours  Meds: docusate, 80mg  furosemide  x1, SS Novolog, pantoprazole, Miralax ,    PHOS and magnesium  high. She received supplementation of both on 11/09. Bump up in creatine with diuresis.    Labs:  Na+ 139 (wdl) PHOS 6.5 (H) Mg 3.8 (H) Crt 2.10 (H) WBC 10.9 (H) CBGs 136-201 x24 hours A1c 5.6 (06/2024)  Diet Order:   Diet Order             Diet NPO time specified  Diet effective now                   EDUCATION NEEDS:   No education needs have been identified at this time  Skin:  Skin Assessment: Skin Integrity Issues: Skin Integrity Issues:: DTI, Stage II DTI: L heel Stage II: sacrum  Last BM:  11/09 - type 6 x1  Height:  Ht Readings from Last 1 Encounters:  06/30/24 5' 1 (1.549 m)   Weight:  Wt Readings from Last 1 Encounters:  07/15/24 76.9 kg   Ideal Body Weight:  47.7 kg  BMI:  Body mass index is 32.03 kg/m.  Estimated Nutritional Needs:   Kcal:  1500-1700 kcal/d  Protein:  75-90 g/d  Fluid:  >/=1.5L  Blair Deaner MS, RD, LDN Registered Dietitian Clinical Nutrition RD Inpatient Contact Info in Amion

## 2024-07-16 ENCOUNTER — Inpatient Hospital Stay (HOSPITAL_COMMUNITY)

## 2024-07-16 DIAGNOSIS — R6521 Severe sepsis with septic shock: Secondary | ICD-10-CM | POA: Diagnosis not present

## 2024-07-16 DIAGNOSIS — J939 Pneumothorax, unspecified: Secondary | ICD-10-CM

## 2024-07-16 DIAGNOSIS — L8990 Pressure ulcer of unspecified site, unspecified stage: Secondary | ICD-10-CM | POA: Insufficient documentation

## 2024-07-16 DIAGNOSIS — A419 Sepsis, unspecified organism: Secondary | ICD-10-CM | POA: Diagnosis not present

## 2024-07-16 DIAGNOSIS — N179 Acute kidney failure, unspecified: Secondary | ICD-10-CM | POA: Diagnosis not present

## 2024-07-16 LAB — COMPREHENSIVE METABOLIC PANEL WITH GFR
ALT: 29 U/L (ref 0–44)
AST: 55 U/L — ABNORMAL HIGH (ref 15–41)
Albumin: 2.5 g/dL — ABNORMAL LOW (ref 3.5–5.0)
Alkaline Phosphatase: 43 U/L (ref 38–126)
Anion gap: 19 — ABNORMAL HIGH (ref 5–15)
BUN: 72 mg/dL — ABNORMAL HIGH (ref 8–23)
CO2: 31 mmol/L (ref 22–32)
Calcium: 8.5 mg/dL — ABNORMAL LOW (ref 8.9–10.3)
Chloride: 88 mmol/L — ABNORMAL LOW (ref 98–111)
Creatinine, Ser: 2.89 mg/dL — ABNORMAL HIGH (ref 0.44–1.00)
GFR, Estimated: 16 mL/min — ABNORMAL LOW (ref >60)
Glucose, Bld: 160 mg/dL — ABNORMAL HIGH (ref 70–99)
Potassium: 4.4 mmol/L (ref 3.5–5.1)
Sodium: 138 mmol/L (ref 135–145)
Total Bilirubin: 0.4 mg/dL (ref 0.0–1.2)
Total Protein: 5.3 g/dL — ABNORMAL LOW (ref 6.5–8.1)

## 2024-07-16 LAB — POCT I-STAT 7, (LYTES, BLD GAS, ICA,H+H)
Acid-Base Excess: 4 mmol/L — ABNORMAL HIGH (ref 0.0–2.0)
Acid-Base Excess: 6 mmol/L — ABNORMAL HIGH (ref 0.0–2.0)
Acid-Base Excess: 2 mmol/L (ref 0.0–2.0)
Bicarbonate: 35.1 mmol/L — ABNORMAL HIGH (ref 20.0–28.0)
Bicarbonate: 34.5 mmol/L — ABNORMAL HIGH (ref 20.0–28.0)
Bicarbonate: 31.1 mmol/L — ABNORMAL HIGH (ref 20.0–28.0)
Calcium, Ion: 1.07 mmol/L — ABNORMAL LOW (ref 1.15–1.40)
Calcium, Ion: 1.05 mmol/L — ABNORMAL LOW (ref 1.15–1.40)
Calcium, Ion: 1.04 mmol/L — ABNORMAL LOW (ref 1.15–1.40)
HCT: 29 % — ABNORMAL LOW (ref 36.0–46.0)
HCT: 29 % — ABNORMAL LOW (ref 36.0–46.0)
HCT: 28 % — ABNORMAL LOW (ref 36.0–46.0)
Hemoglobin: 9.9 g/dL — ABNORMAL LOW (ref 12.0–15.0)
Hemoglobin: 9.9 g/dL — ABNORMAL LOW (ref 12.0–15.0)
Hemoglobin: 9.5 g/dL — ABNORMAL LOW (ref 12.0–15.0)
O2 Saturation: 79 %
O2 Saturation: 100 %
O2 Saturation: 100 %
Patient temperature: 99.3
Patient temperature: 99.9
Patient temperature: 98
Potassium: 5.1 mmol/L (ref 3.5–5.1)
Potassium: 5.1 mmol/L (ref 3.5–5.1)
Potassium: 5.5 mmol/L — ABNORMAL HIGH (ref 3.5–5.1)
Sodium: 134 mmol/L — ABNORMAL LOW (ref 135–145)
Sodium: 132 mmol/L — ABNORMAL LOW (ref 135–145)
Sodium: 132 mmol/L — ABNORMAL LOW (ref 135–145)
TCO2: 38 mmol/L — ABNORMAL HIGH (ref 22–32)
TCO2: 37 mmol/L — ABNORMAL HIGH (ref 22–32)
TCO2: 34 mmol/L — ABNORMAL HIGH (ref 22–32)
pCO2 arterial: 99.6 mmHg (ref 32–48)
pCO2 arterial: 80.8 mmHg (ref 32–48)
pCO2 arterial: 80.4 mmHg (ref 32–48)
pH, Arterial: 7.157 — CL (ref 7.35–7.45)
pH, Arterial: 7.242 — ABNORMAL LOW (ref 7.35–7.45)
pH, Arterial: 7.194 — CL (ref 7.35–7.45)
pO2, Arterial: 60 mmHg — ABNORMAL LOW (ref 83–108)
pO2, Arterial: 247 mmHg — ABNORMAL HIGH (ref 83–108)
pO2, Arterial: 276 mmHg — ABNORMAL HIGH (ref 83–108)

## 2024-07-16 LAB — RENAL FUNCTION PANEL
Albumin: 2.5 g/dL — ABNORMAL LOW (ref 3.5–5.0)
Anion gap: 20 — ABNORMAL HIGH (ref 5–15)
BUN: 92 mg/dL — ABNORMAL HIGH (ref 8–23)
CO2: 29 mmol/L (ref 22–32)
Calcium: 8.4 mg/dL — ABNORMAL LOW (ref 8.9–10.3)
Chloride: 91 mmol/L — ABNORMAL LOW (ref 98–111)
Creatinine, Ser: 3.66 mg/dL — ABNORMAL HIGH (ref 0.44–1.00)
GFR, Estimated: 12 mL/min — ABNORMAL LOW (ref >60)
Glucose, Bld: 141 mg/dL — ABNORMAL HIGH (ref 70–99)
Phosphorus: 8.3 mg/dL — ABNORMAL HIGH (ref 2.5–4.6)
Potassium: 5.3 mmol/L — ABNORMAL HIGH (ref 3.5–5.1)
Sodium: 140 mmol/L (ref 135–145)

## 2024-07-16 LAB — CULTURE, BLOOD (ROUTINE X 2)
Culture: NO GROWTH
Culture: NO GROWTH
Special Requests: ADEQUATE

## 2024-07-16 LAB — CBC
HCT: 28.6 % — ABNORMAL LOW (ref 36.0–46.0)
Hemoglobin: 9.1 g/dL — ABNORMAL LOW (ref 12.0–15.0)
MCH: 30.5 pg (ref 26.0–34.0)
MCHC: 31.8 g/dL (ref 30.0–36.0)
MCV: 96 fL (ref 80.0–100.0)
Platelets: 209 K/uL (ref 150–400)
RBC: 2.98 MIL/uL — ABNORMAL LOW (ref 3.87–5.11)
RDW: 15.1 % (ref 11.5–15.5)
WBC: 11 K/uL — ABNORMAL HIGH (ref 4.0–10.5)
nRBC: 0.6 % — ABNORMAL HIGH (ref 0.0–0.2)

## 2024-07-16 LAB — GLUCOSE, CAPILLARY
Glucose-Capillary: 107 mg/dL — ABNORMAL HIGH (ref 70–99)
Glucose-Capillary: 107 mg/dL — ABNORMAL HIGH (ref 70–99)
Glucose-Capillary: 127 mg/dL — ABNORMAL HIGH (ref 70–99)
Glucose-Capillary: 137 mg/dL — ABNORMAL HIGH (ref 70–99)
Glucose-Capillary: 146 mg/dL — ABNORMAL HIGH (ref 70–99)
Glucose-Capillary: 196 mg/dL — ABNORMAL HIGH (ref 70–99)

## 2024-07-16 LAB — BODY FLUID CULTURE W GRAM STAIN
Culture: NO GROWTH
Culture: NO GROWTH
Gram Stain: NONE SEEN

## 2024-07-16 LAB — CULTURE, RESPIRATORY W GRAM STAIN
Culture: NO GROWTH
Gram Stain: NONE SEEN

## 2024-07-16 LAB — APTT
aPTT: 100 s — ABNORMAL HIGH (ref 24–36)
aPTT: 67 s — ABNORMAL HIGH (ref 24–36)
aPTT: 53 s — ABNORMAL HIGH (ref 24–36)

## 2024-07-16 MED ORDER — DEXAMETHASONE SODIUM PHOSPHATE 4 MG/ML IJ SOLN
4.0000 mg | Freq: Four times a day (QID) | INTRAMUSCULAR | Status: DC
  Administered 2024-07-16 – 2024-07-17 (×4): 4 mg via INTRAVENOUS
  Filled 2024-07-16 (×4): qty 1

## 2024-07-16 MED ORDER — NOREPINEPHRINE 16 MG/250ML-% IV SOLN
0.0000 ug/min | INTRAVENOUS | Status: DC
  Administered 2024-07-16: 4 ug/min via INTRAVENOUS
  Administered 2024-07-18: 2 ug/min via INTRAVENOUS
  Administered 2024-07-20: 6 ug/min via INTRAVENOUS
  Administered 2024-07-21: 10 ug/min via INTRAVENOUS
  Administered 2024-07-23: 5 ug/min via INTRAVENOUS
  Filled 2024-07-16 (×5): qty 250

## 2024-07-16 MED ORDER — PRISMASOL BGK 4/2.5 32-4-2.5 MEQ/L EC SOLN: Status: DC

## 2024-07-16 MED ORDER — VITAL 1.5 CAL PO LIQD
1000.0000 mL | ORAL | Status: DC
  Administered 2024-07-17 – 2024-07-18 (×2): 1000 mL

## 2024-07-16 MED ORDER — NOREPINEPHRINE 4 MG/250ML-% IV SOLN
0.0000 ug/min | INTRAVENOUS | Status: DC
Start: 2024-07-16 — End: 2024-07-16

## 2024-07-16 MED ORDER — PANTOPRAZOLE SODIUM 40 MG IV SOLR
40.0000 mg | INTRAVENOUS | Status: DC
  Administered 2024-07-17 – 2024-07-31 (×15): 40 mg via INTRAVENOUS
  Filled 2024-07-16 (×16): qty 10

## 2024-07-16 MED ORDER — SODIUM CHLORIDE 0.9% FLUSH
10.0000 mL | Freq: Three times a day (TID) | INTRAVENOUS | Status: DC
  Administered 2024-07-16 – 2024-07-17 (×3): 10 mL via INTRAPLEURAL

## 2024-07-16 MED ORDER — HEPARIN SODIUM (PORCINE) 1000 UNIT/ML DIALYSIS
1000.0000 [IU] | INTRAMUSCULAR | Status: DC | PRN
  Administered 2024-07-18: 2800 [IU] via INTRAVENOUS_CENTRAL
  Administered 2024-07-21: 3000 [IU] via INTRAVENOUS_CENTRAL
  Filled 2024-07-16: qty 6
  Filled 2024-07-16: qty 3
  Filled 2024-07-16: qty 6
  Filled 2024-07-16: qty 3
  Filled 2024-07-16: qty 6

## 2024-07-16 MED ORDER — GADOBUTROL 1 MMOL/ML IV SOLN
7.5000 mL | Freq: Once | INTRAVENOUS | Status: AC | PRN
  Administered 2024-07-16: 7.5 mL via INTRAVENOUS

## 2024-07-16 MED ORDER — ORAL CARE MOUTH RINSE
15.0000 mL | OROMUCOSAL | Status: DC
  Administered 2024-07-16: 15 mL via OROMUCOSAL

## 2024-07-16 MED ORDER — SODIUM CHLORIDE 0.9 % IV SOLN
0.0560 mg/kg/h | INTRAVENOUS | Status: DC
  Administered 2024-07-16 – 2024-07-21 (×4): 0.1 mg/kg/h via INTRAVENOUS
  Administered 2024-07-23: 0.056 mg/kg/h via INTRAVENOUS
  Filled 2024-07-16 (×6): qty 250

## 2024-07-16 MED ORDER — AMIODARONE HCL 200 MG PO TABS
200.0000 mg | ORAL_TABLET | Freq: Every day | ORAL | Status: DC
  Administered 2024-07-16 – 2024-07-21 (×6): 200 mg
  Filled 2024-07-16 (×6): qty 1

## 2024-07-16 MED ORDER — ORAL CARE MOUTH RINSE: 15.0000 mL | OROMUCOSAL | Status: DC | PRN

## 2024-07-16 NOTE — Progress Notes (Signed)
 eLink Physician-Brief Progress Note Patient Name: CATELYN FRIEL DOB: 11-09-1945 MRN: [id number]   Date of Service  07/16/2024  HPI/Events of Note  Concern for hypotension with CRRT.  eICU Interventions  Arterial line ordered for closer BP monitoring on dialysis.        Barrie Wale U Costantino Kohlbeck 07/16/2024, 8:05 PM

## 2024-07-16 NOTE — Progress Notes (Signed)
 PHARMACY - ANTICOAGULATION CONSULT NOTE  Pharmacy Consult for bivalirudin Indication: Afib and IJ thrombus  Labs: Recent Labs    07/15/24 0419 07/15/24 1555 07/15/24 2000 07/15/24 2059 07/16/24 0200  HGB 9.8* 9.8*  --  9.5* 9.1*  HCT 31.0* 31.5*  --  29.9* 28.6*  PLT 234 222  --  224 209  APTT  --  80* 94*  --  100*  CREATININE 2.10*  --   --  2.74* 2.89*   Assessment: 77yo female supratherapeutic on bival with increased PTT despite decreased rate; no infusion issues or signs of bleeding per RN.  Goal of Therapy:  aPTT 80-85 seconds   Plan:  Decrease bivalirudin infusion by 20% to 0.1 mg/kgABW/hr. Check PTT in 4 hours.   Katie Waller, PharmD, BCPS 07/16/2024 3:06 AM

## 2024-07-16 NOTE — Progress Notes (Addendum)
 PHARMACY - ANTICOAGULATION CONSULT NOTE  Pharmacy Consult:  Bivalirudin  Indication: atrial fibrillation and internal jugular thombus   Allergies  Allergen Reactions   Ace Inhibitors Swelling   Diphenhydramine Other (See Comments)    Hyperactive/jittery    Doxycycline Hyclate Nausea And Vomiting   Codeine Nausea And Vomiting    Patient Measurements: Height: 5' 1 (154.9 cm) Weight: 76.9 kg (169 lb 8.5 oz) IBW/kg (Calculated) : 47.8 HEPARIN DW (KG): 63.1  Vital Signs: Temp: 97.5 F (36.4 C) (11/11 0800) Temp Source: Axillary (11/11 0800) BP: 134/69 (11/11 0900) Pulse Rate: 84 (11/11 0900)  Labs: Recent Labs    07/15/24 0419 07/15/24 1555 07/15/24 2000 07/15/24 2059 07/16/24 0200 07/16/24 0827  HGB 9.8* 9.8*  --  9.5* 9.1*  --   HCT 31.0* 31.5*  --  29.9* 28.6*  --   PLT 234 222  --  224 209  --   APTT  --  80* 94*  --  100* 67*  CREATININE 2.10*  --   --  2.74* 2.89*  --     Estimated Creatinine Clearance: 15.8 mL/min (A) (by C-G formula based on SCr of 2.89 mg/dL (H)).   Medical History: Past Medical History:  Diagnosis Date   Abnormal finding on EKG 09/19/2013   Acute on chronic respiratory failure with hypercapnia (HCC)    Acute respiratory failure (HCC) 06/08/2016   Anxiety 11/09/2020   Arthritis    Asthma    Carotid bruit 11/09/2020   Chronic diastolic CHF (congestive heart failure) (HCC) 07/28/2017   Chronic obstructive pulmonary disease, unspecified (HCC) 11/09/2020   COPD, group D, by GOLD 2017 classification (HCC) 09/17/2013   Cough 11/09/2020   Decreased estrogen level 11/09/2020   Diabetes (HCC)    Dyspnea    Edema 11/09/2020   Essential hypertension 09/18/2013   Goals of care, counseling/discussion    Hardening of the aorta (main artery of the heart) 11/09/2020   Heart disease    History of radiation therapy    Left Lung- 06/20/23-06/26/23- Dr. 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  She was previously on IV heparin and was switched to bivalirudin due to labile heparin levels.  Med was stopped because it was thought that Afib was secondary to critical illness, which resolved. However patient went back into Afib w/ RVR on 11/7.   HgB stable this AM after transfusion of two units on 11/9. HgB 9.7 and PLTs 2344. No s/sx of bleeding. Discussed with CCM will resume bival.   aPTT 67 - therapeutic for goal on Bivalrudin 0.1 mg/kg/hr.  Hgb low-stable. Platelets are within normal limits.  No bleeding noted.   Goal of Therapy:  aPTT 50-85 seconds Monitor platelets by anticoagulation protocol: Yes   Plan:  Continue bivalrudin to 0.1 mg/kg/hr.  Using weight of 66 kg which is what weight had been used previously while patient was therapeutic on  bival.  aPTT BID.  Daily CBC  Addendum: Bivalrudin stopped from 1300-16:30 while Neurosurgery consulted regarding C-spine results - to evaluate. No plan to intervene. Dr. Claudene requested restart Bivalidom  Plan: Resume Bivalrudin at 0.1 mg/kg/hr.  Recheck aPTT in 4 hours.   Harlene Boga, PharmD, BCPS, BCCCP Clinical Pharmacist Please refer to [facility] for [facility] Pharmacy numbers 07/16/2024, 11:13 AM

## 2024-07-16 NOTE — TOC Progression Note (Addendum)
 Transition of Care [facility]) - Progression Note    Patient Details  Name: Katie Waller MRN: [id number] Date of Birth: 16-Mar-1946  [facility] CM/SW Contact  Monay Houlton M, RN Phone Number: 07/16/2024, 11:40am  Clinical Narrative:    Spoke with patient's daughter [name] by phone regarding LTAC choice; she was given names of both facilities and Care Manager offered to have admissions liaisons call her if she has questions.  She states that she does not prefer a call, but wishes to discuss LTAC choices with her family.  Inpatient Care Manager contact information given to daughter; she states she will call back when decision is made.    Addendum: 1251pm Received call from [name], patient's daughter; she and family prefer [facility] for LTAC needs.  Will notify admissions liaison for [facility] to evaluate for eligibility and start insurance authorization as applicable. Will follow with updates.   Addendum: 1630 Patient to start CRRT; will hold on LTAC for now pending medical stability.      Expected Discharge Plan: Long Term Acute Care (LTAC) Barriers to Discharge: Continued Medical Work up, [employer] and Services In-house Referral: Clinical Social Work Discharge Planning Services: [employer] Consult Post Acute Care Choice: Long Term Acute Care (LTAC) Living arrangements for the past 2 months: Single Family Home                                       Social Drivers of Health (SDOH) Interventions SDOH Screenings   Food Insecurity: No Food Insecurity (07/04/2024)  Housing: Low Risk  (07/04/2024)  Transportation Needs: No Transportation Needs (07/04/2024)  Utilities: Not At Risk (07/04/2024)  Depression (PHQ2-9): Low Risk  (06/05/2023)  Social Connections: Patient Unable To Answer (07/09/2024)  Tobacco Use: Medium Risk (06/25/2024)    Readmission Risk Interventions     No data to  display         Mliss MICAEL Fass, RN, BSN  Trauma/Neuro ICU Case Manager [phone]

## 2024-07-16 NOTE — Progress Notes (Signed)
 SLP Cancellation Note  Patient Details Name: Katie Waller MRN: [id number] DOB: 1946/06/14   Cancelled treatment:       Reason Eval/Treat Not Completed: Patient not medically ready, still requiring vent support. Will f/u as able.     Leita SAILOR., M.A. CCC-SLP Acute Rehabilitation Services Office: [phone]  Secure chat preferred  07/16/2024, 9:15 AM

## 2024-07-16 NOTE — Progress Notes (Signed)
 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 Code stroke called. On arrival to ED patient emergently intubated for acute respiratory distress and airway protection. CT Head with no ICH or large infarct, CTA head and neck with no LVO. CXR concerning for a Pneumonia with visible BL lung apices on CTA demonstrating infection/inflammation. Code stroke cancelled with presentation suspicious for sepsis. Patient received 30ml/kg crystalloid, pan cultured, and started empirically on Cefepime/Vanc/Azithromycin . Of note, family reports patient has recently not felt well and likely has had a cold. PCCM consulted for ICU admission.   On exam in ED, patient febrile to >101 and hypotensive, likely multifactorial in setting of presumed sepsis and recent sedation on induction with underlying heart disease. Norepinephrine gtt was initiated for MAPs < 65 and patient was transferred to ICU with plans to gain central access for vasopressor administration and arterial line placement for hemodynamic monitoring.  Pertinent  Medical History  Worsening respiratory distress with increasing oxygen  requirement this a.m.  pCO2 on ABG 103  Significant Hospital Events: Including procedures, antibiotic start and stop dates in addition to other pertinent events   10/21 Found unresponsive at home->ED>Code stroke>intubated for ARD/airway protection>CTH/CTA negative>Code stroke cancelled>Hypotensive>started on pressors>Admit to ICU  Arterial line placed  10/21 10/22 and 10/23 unsuccessful extubation 10/24: Despite maximal Precedex and fentanyl  pushes, unable to safely and comfortably clean the patient up so added prn Versed and started fentanyl  infusion. Rectal tube added for diarrhea.  10/24: 2/3 BC resulted in staph capitis/epidermitis, extubated on BiPAP 10/25 patient remained on BiPAP for 24 hours, when she came off of BiPAP, could not tolerate became tachypneic, tachycardic went into respiratory distress requiring endotracheal reintubation 10/29 unsuccessful SBT, went briefly back into RVR 10/30 unsuccessful weaning from sedation as BP/HR increased considerably 11/2: extubated to HFNC 11/8 progressively became more hypoxic with decreased respiratory drive prompting ABG this a.m. which revealed toxic hypercapnic respiratory failure.  Transferred back to ICU for impending intubation; tried draining pleural spaces but still tired out 11/10 trach 11/10 aggressive diuresis with Lasix  80 followed by 160 followed by 260 + metolazone and bicarb - no response 11/11 nephro consult. LE weakness noted on exam with inability to move - neuro consulted, MRI C and T spine ordered  Interim History / Subjective:  Not able to wean today due to low effort. No response to high dose Lasix . Remains volume overloaded. Nephrology consulted. CVP 20.  LE weakness and concern for paralysis on exam. Neuro consulted, MRI C and T spine ordered.   Objective    Blood pressure 102/76, pulse 93, temperature (!) 97.5 F (36.4 C), temperature source Axillary, resp. rate 20, height 5' 1 (1.549 m), weight 76.9 kg, SpO2 98%. CVP:  [14 mmHg-16 mmHg] 14 mmHg  Vent Mode: PRVC FiO2 (%):  [40 %] 40 % Set Rate:  [20 bmp] 20 bmp Vt Set:  [380 mL] 380 mL PEEP:  [5 cmH20] 5 cmH20 Plateau Pressure:  [15 cmH20-25 cmH20] 22 cmH20   Intake/Output Summary (Last 24 hours) at 07/16/2024 9141 Last data filed at  07/16/2024 0811 Gross per 24 hour  Intake 1951.63 ml  Output 65 ml   Net 1886.63 ml   Filed Weights   07/13/24 0340 07/14/24 0500 07/15/24 0500  Weight: 68 kg 75.4 kg 76.9 kg    Examination: General: Adult female, resting in bed, in NAD. Neuro: Somnolent, opens eyes to voice, able to track with eyes and stick tongue out. Flicker response on left hand, less on right hand. NO response to noxious stimuli on bilateral LE's. HEENT: [place]/AT. Sclerae anicteric. Trach C/D/I. Cardiovascular: RRR, no M/R/G.  Lungs: Respirations even and unlabored.  CTA bilaterally, No W/R/R. Abdomen: BS x 4, soft, NT/ND.  Musculoskeletal: No gross deformities, abdominal wall and UE edema.   Resolved problem list  Acute COPD exacerbation Human metapneumovirus pneumonia Ileus AKI   Assessment and Plan  Acute on chronic hypoxic & hypercapnic respiratory failure - s/p trach 11/9 Concern for recurrent bacterial infection vs aspiration - cultures neg thus far. Has had quite a long course of abx already Fluid overload - baseline weight per daughter around 146-150 lbs, today at 171lbs (11/10) BL effusions - drained 11/8 to try to stave off intubation, pigtail remains on R with stable output, 110cc overnight. Labs c/w transudates. 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 CT chest/abd/pelv from 11/7 without obvious source. LE weakness and concern for paralysis? On exam 11/11 - new finding  - continue vent support - wean daily, no success this AM due to low effort - routine trach care - Nephrology consulted today after no response to high dose Lasix  11/10, appreciate the assistance - avoid nephrotoxins - abx d/c 11/10 - D/c right chest tube today 11/11 - LABA/LAMA nebs - Continue amio,  transition from infusion to per tube 200 daily for now, consider dropping to 100 daily in next few days - Limit sedation, currently all off - Continue Bivalirudin - Daughter updated at bedside - Might need CIR vs vent SNF depending on course - Neurology consulted, appreciate the assistance - MRI C and T spine now   CC time: 30 min.   Sammi Gore, PA - [facility] For pager details, please see AMION or use Epic chat  After 1900, please call [facility] for cross coverage needs 07/16/2024, 8:58 AM

## 2024-07-16 NOTE — Consult Note (Signed)
 Reason for Consult:Cervical stenosis C4/5,5/6 Referring Physician: Seraphine, Katie Waller is an 78 y.o. female.  HPI: admitted for mental status changes, metabolic derangement, and respiratory failure. She was intubated, placed under sedation essentially from admission on. A finding of weakness in all extremities was noticed today and an MRI of the cervical and thoracic spine was performed. The cervical spine showed spinal stenosis. I was called for evaluation and recommendations.  She has a complex medical history, currently trached. CRRT being instituted for renal failure.  Past Medical History:  Diagnosis Date   Abnormal finding on EKG 09/19/2013   Acute on chronic respiratory failure with hypercapnia (HCC)    Acute respiratory failure (HCC) 06/08/2016   Anxiety 11/09/2020   Arthritis    Asthma    Carotid bruit 11/09/2020   Chronic diastolic CHF (congestive heart failure) (HCC) 07/28/2017   Chronic obstructive pulmonary disease, unspecified (HCC) 11/09/2020   COPD, group D, by GOLD 2017 classification (HCC) 09/17/2013   Cough 11/09/2020   Decreased estrogen level 11/09/2020   Diabetes (HCC)    Dyspnea    Edema 11/09/2020   Essential hypertension 09/18/2013   Goals of care, counseling/discussion    Hardening of the aorta (main artery of the heart) 11/09/2020   Heart disease    History of radiation therapy    Left Lung- 06/20/23-06/26/23- Dr. 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   Transient ischemic attack 11/09/2020    Venous stasis of both lower extremities 06/13/2016    Past Surgical History:  Procedure Laterality Date   BRONCHIAL BIOPSY  05/25/2023   Procedure: BRONCHIAL BIOPSIES;  Surgeon: Brenna Adine LITTIE, DO;  Location: [facility];  Service: Pulmonary;;   BRONCHIAL NEEDLE ASPIRATION BIOPSY  05/25/2023   Procedure: BRONCHIAL NEEDLE ASPIRATION BIOPSIES;  Surgeon: Brenna Adine LITTIE, DO;  Location: [facility];  Service: Pulmonary;;   FIDUCIAL MARKER PLACEMENT  05/25/2023   Procedure: FIDUCIAL MARKER PLACEMENT;  Surgeon: Brenna Adine LITTIE, DO;  Location: [facility];  Service: Pulmonary;;   KNEE SURGERY Left     Family History  Problem Relation Age of Onset   Hyperlipidemia Mother    Hypertension Mother    Hypertension Father    Hyperlipidemia Father     Social History:  reports that she quit smoking about 6 years ago. Her smoking use included cigarettes. She started smoking about 6 years ago. She has a 55 pack-year smoking history. She has never used smokeless tobacco. She reports that she does not drink alcohol and does not use drugs.  Allergies:  Allergies  Allergen Reactions   Ace Inhibitors Swelling   Diphenhydramine Other (See Comments)    Hyperactive/jittery    Doxycycline Hyclate Nausea And Vomiting   Codeine Nausea And Vomiting    Medications: I have reviewed the patient's current medications.  Results for orders placed or performed during the hospital encounter of 06/25/24 (from the past 48 hours)  Glucose, capillary     Status: Abnormal   Collection Time: 07/14/24  7:41 PM  Result Value Ref Range   Glucose-Capillary  100 (H) 70 - 99 mg/dL    Comment: Glucose reference range applies only to samples taken after fasting for at least 8 hours.  CBC     Status: Abnormal   Collection Time: 07/14/24 10:05 PM  Result Value Ref Range   WBC 10.9 (H) 4.0 - 10.5 K/uL   RBC 3.17 (L) 3.87 - 5.11 MIL/uL   Hemoglobin 9.7 (L) 12.0 - 15.0 g/dL   HCT 69.2 (L) 63.9 - 53.9 %   MCV 96.8 80.0  - 100.0 fL   MCH 30.6 26.0 - 34.0 pg   MCHC 31.6 30.0 - 36.0 g/dL   RDW 85.0 88.4 - 84.4 %   Platelets 222 150 - 400 K/uL   nRBC 0.7 (H) 0.0 - 0.2 %    Comment: Performed at [facility], [address], [place], [place] [address]  Glucose, capillary     Status: Abnormal   Collection Time: 07/14/24 11:24 PM  Result Value Ref Range   Glucose-Capillary 125 (H) 70 - 99 mg/dL    Comment: Glucose reference range applies only to samples taken after fasting for at least 8 hours.  Glucose, capillary     Status: Abnormal   Collection Time: 07/15/24  3:24 AM  Result Value Ref Range   Glucose-Capillary 133 (H) 70 - 99 mg/dL    Comment: Glucose reference range applies only to samples taken after fasting for at least 8 hours.  Magnesium      Status: Abnormal   Collection Time: 07/15/24  4:19 AM  Result Value Ref Range   Magnesium  3.8 (H) 1.7 - 2.4 mg/dL    Comment: Performed at [facility], [address], [place], [place] [address]  Phosphorus     Status: Abnormal   Collection Time: 07/15/24  4:19 AM  Result Value Ref Range   Phosphorus 6.5 (H) 2.5 - 4.6 mg/dL    Comment: Performed at [facility], [address], [place], [place] [address]  CBC     Status: Abnormal   Collection Time: 07/15/24  4:19 AM  Result Value Ref Range   WBC 10.9 (H) 4.0 - 10.5 K/uL   RBC 3.20 (L) 3.87 - 5.11 MIL/uL   Hemoglobin 9.8 (L) 12.0 - 15.0 g/dL   HCT 68.9 (L) 63.9 - 53.9 %   MCV 96.9 80.0 - 100.0 fL   MCH 30.6 26.0 - 34.0 pg   MCHC 31.6 30.0 - 36.0 g/dL   RDW 84.5 88.4 - 84.4 %   Platelets 234 150 - 400 K/uL   nRBC 0.7 (H) 0.0 - 0.2 %    Comment: Performed at [facility], [address] [place], [place] [address]  Comprehensive metabolic panel with GFR     Status: Abnormal   Collection Time: 07/15/24  4:19 AM  Result Value Ref Range   Sodium 139 135 - 145 mmol/L   Potassium 3.9 3.5 - 5.1 mmol/L   Chloride 92 (L) 98 - 111 mmol/L   CO2 33 (H) 22 - 32 mmol/L   Glucose, Bld  136 (H) 70 - 99 mg/dL    Comment: Glucose reference range applies only to samples taken after fasting for at least 8 hours.   BUN 52 (H) 8 - 23 mg/dL   Creatinine, Ser 7.89 (H) 0.44 - 1.00 mg/dL   Calcium  8.9 8.9 - 10.3 mg/dL   Total Protein 5.3 (L) 6.5 - 8.1 g/dL   Albumin 2.9 (L) 3.5 - 5.0 g/dL  AST 47 (H) 15 - 41 U/L   ALT 22 0 - 44 U/L   Alkaline Phosphatase 38 38 - 126 U/L   Total Bilirubin 0.7 0.0 - 1.2 mg/dL   GFR, Estimated 24 (L) >60 mL/min    Comment: (NOTE) Calculated using the CKD-EPI Creatinine Equation (2021)    Anion gap 14 5 - 15    Comment: Performed at [facility], [address] [place], [place] [address]  Glucose, capillary     Status: Abnormal   Collection Time: 07/15/24  7:15 AM  Result Value Ref Range   Glucose-Capillary 166 (H) 70 - 99 mg/dL    Comment: Glucose reference range applies only to samples taken after fasting for at least 8 hours.  Glucose, capillary     Status: Abnormal   Collection Time: 07/15/24 11:11 AM  Result Value Ref Range   Glucose-Capillary 264 (H) 70 - 99 mg/dL    Comment: Glucose reference range applies only to samples taken after fasting for at least 8 hours.  Glucose, capillary     Status: Abnormal   Collection Time: 07/15/24  3:19 PM  Result Value Ref Range   Glucose-Capillary 176 (H) 70 - 99 mg/dL    Comment: Glucose reference range applies only to samples taken after fasting for at least 8 hours.  CBC     Status: Abnormal   Collection Time: 07/15/24  3:55 PM  Result Value Ref Range   WBC 12.3 (H) 4.0 - 10.5 K/uL   RBC 3.20 (L) 3.87 - 5.11 MIL/uL   Hemoglobin 9.8 (L) 12.0 - 15.0 g/dL   HCT 68.4 (L) 63.9 - 53.9 %   MCV 98.4 80.0 - 100.0 fL   MCH 30.6 26.0 - 34.0 pg   MCHC 31.1 30.0 - 36.0 g/dL   RDW 84.4 88.4 - 84.4 %   Platelets 222 150 - 400 K/uL   nRBC 0.8 (H) 0.0 - 0.2 %    Comment: Performed at [facility], [address], [place], [place] [address]  MRSA Next Gen by PCR, Nasal     Status: None    Collection Time: 07/15/24  3:55 PM   Specimen: Nasal Mucosa; Nasal Swab  Result Value Ref Range   MRSA by PCR Next Gen NOT DETECTED NOT DETECTED    Comment: (NOTE) The GeneXpert MRSA Assay (FDA approved for NASAL specimens only), is one component of a comprehensive MRSA colonization surveillance program. It is not intended to diagnose MRSA infection nor to guide or monitor treatment for MRSA infections. Test performance is not FDA approved in patients less than 51 years old. Performed at [facility], [address], [place], [place] [address]   APTT     Status: Abnormal   Collection Time: 07/15/24  3:55 PM  Result Value Ref Range   aPTT 80 (H) 24 - 36 seconds    Comment:        IF BASELINE aPTT IS ELEVATED, SUGGEST PATIENT RISK ASSESSMENT BE USED TO DETERMINE APPROPRIATE ANTICOAGULANT THERAPY. Performed at [facility], [address] [place], [place] [address]   Glucose, capillary     Status: Abnormal   Collection Time: 07/15/24  7:45 PM  Result Value Ref Range   Glucose-Capillary 101 (H) 70 - 99 mg/dL    Comment: Glucose reference range applies only to samples taken after fasting for at least 8 hours.  APTT     Status: Abnormal   Collection Time: 07/15/24  8:00 PM  Result Value Ref Range   aPTT 94 (H) 24 - 36 seconds    Comment:        IF BASELINE aPTT IS ELEVATED, SUGGEST PATIENT RISK ASSESSMENT BE USED TO DETERMINE APPROPRIATE ANTICOAGULANT THERAPY. Performed at [facility], [address] [place], [place] [address]   CBC     Status: Abnormal   Collection Time: 07/15/24  8:59 PM  Result Value Ref Range   WBC 11.9 (H) 4.0 - 10.5 K/uL   RBC 3.07 (L) 3.87 - 5.11 MIL/uL   Hemoglobin 9.5 (L) 12.0 - 15.0 g/dL   HCT 70.0 (L) 63.9 - 53.9 %   MCV 97.4 80.0 - 100.0 fL   MCH 30.9 26.0 - 34.0 pg   MCHC 31.8 30.0 - 36.0 g/dL   RDW 84.4 88.4 - 84.4 %   Platelets 224 150 - 400 K/uL   nRBC 0.9 (H) 0.0 - 0.2 %    Comment: Performed at [facility]  Lab, [address] [place], [place] [address]  Basic metabolic panel     Status: Abnormal   Collection Time: 07/15/24  8:59 PM  Result Value Ref Range   Sodium 137 135 - 145 mmol/L   Potassium 4.6 3.5 - 5.1 mmol/L   Chloride 89 (L) 98 - 111 mmol/L   CO2 31 22 - 32 mmol/L   Glucose, Bld 181 (H) 70 - 99 mg/dL    Comment: Glucose reference range applies only to samples taken after fasting for at least 8 hours.   BUN 69 (H) 8 - 23 mg/dL   Creatinine, Ser 7.25 (H) 0.44 - 1.00 mg/dL   Calcium  8.5 (L) 8.9 - 10.3 mg/dL   GFR, Estimated 18 (L) >60 mL/min    Comment: (NOTE) Calculated using the CKD-EPI Creatinine Equation (2021)    Anion gap 17 (H) 5 - 15    Comment: Performed at [facility], [address], [place], [place] [address]  Magnesium      Status: Abnormal   Collection Time: 07/15/24  8:59 PM  Result Value Ref Range   Magnesium  3.4 (H) 1.7 - 2.4 mg/dL    Comment: Performed at [facility], [address], [place], [place] [address]  Glucose, capillary     Status: Abnormal   Collection Time: 07/15/24 11:33 PM  Result Value Ref Range   Glucose-Capillary 161 (H) 70 - 99 mg/dL    Comment: Glucose reference range applies only to samples taken after fasting for at least 8 hours.  CBC     Status: Abnormal   Collection Time: 07/16/24  2:00 AM  Result Value Ref Range   WBC 11.0 (H) 4.0 - 10.5 K/uL   RBC 2.98 (L) 3.87 - 5.11 MIL/uL   Hemoglobin 9.1 (L) 12.0 - 15.0 g/dL   HCT 71.3 (L) 63.9 - 53.9 %   MCV 96.0 80.0 - 100.0 fL   MCH 30.5 26.0 - 34.0 pg   MCHC 31.8 30.0 - 36.0 g/dL   RDW 84.8 88.4 - 84.4 %   Platelets 209 150 - 400 K/uL   nRBC 0.6 (H) 0.0 - 0.2 %    Comment: Performed at [facility], [address], [place], [place] [address]  Comprehensive metabolic panel with GFR     Status: Abnormal   Collection Time: 07/16/24  2:00 AM  Result Value Ref Range   Sodium 138 135 - 145 mmol/L   Potassium 4.4 3.5 - 5.1  mmol/L   Chloride 88 (L) 98 - 111 mmol/L   CO2  31 22 - 32 mmol/L   Glucose, Bld 160 (H) 70 - 99 mg/dL    Comment: Glucose reference range applies only to samples taken after fasting for at least 8 hours.   BUN 72 (H) 8 - 23 mg/dL   Creatinine, Ser 7.10 (H) 0.44 - 1.00 mg/dL   Calcium  8.5 (L) 8.9 - 10.3 mg/dL   Total Protein 5.3 (L) 6.5 - 8.1 g/dL   Albumin 2.5 (L) 3.5 - 5.0 g/dL   AST 55 (H) 15 - 41 U/L   ALT 29 0 - 44 U/L   Alkaline Phosphatase 43 38 - 126 U/L   Total Bilirubin 0.4 0.0 - 1.2 mg/dL   GFR, Estimated 16 (L) >60 mL/min    Comment: (NOTE) Calculated using the CKD-EPI Creatinine Equation (2021)    Anion gap 19 (H) 5 - 15    Comment: Performed at [facility], [address], [place], [place] [address]  APTT     Status: Abnormal   Collection Time: 07/16/24  2:00 AM  Result Value Ref Range   aPTT 100 (H) 24 - 36 seconds    Comment:        IF BASELINE aPTT IS ELEVATED, SUGGEST PATIENT RISK ASSESSMENT BE USED TO DETERMINE APPROPRIATE ANTICOAGULANT THERAPY. Performed at [facility], [address] [place], [place] [address]   Glucose, capillary     Status: Abnormal   Collection Time: 07/16/24  3:32 AM  Result Value Ref Range   Glucose-Capillary 107 (H) 70 - 99 mg/dL    Comment: Glucose reference range applies only to samples taken after fasting for at least 8 hours.  Glucose, capillary     Status: Abnormal   Collection Time: 07/16/24  7:31 AM  Result Value Ref Range   Glucose-Capillary 127 (H) 70 - 99 mg/dL    Comment: Glucose reference range applies only to samples taken after fasting for at least 8 hours.  APTT     Status: Abnormal   Collection Time: 07/16/24  8:27 AM  Result Value Ref Range   aPTT 67 (H) 24 - 36 seconds    Comment:        IF BASELINE aPTT IS ELEVATED, SUGGEST PATIENT RISK ASSESSMENT BE USED TO DETERMINE APPROPRIATE ANTICOAGULANT THERAPY. Performed at [facility], [address], [place], [place] [address]   Glucose, capillary     Status: Abnormal   Collection Time:  07/16/24 12:29 PM  Result Value Ref Range   Glucose-Capillary 196 (H) 70 - 99 mg/dL    Comment: Glucose reference range applies only to samples taken after fasting for at least 8 hours.  I-STAT 7, (LYTES, BLD GAS, ICA, H+H)     Status: Abnormal   Collection Time: 07/16/24  3:58 PM  Result Value Ref Range   pH, Arterial 7.157 (LL) 7.35 - 7.45   pCO2 arterial 99.6 (HH) 32 - 48 mmHg   pO2, Arterial 60 (L) 83 - 108 mmHg   Bicarbonate 35.1 (H) 20.0 - 28.0 mmol/L   TCO2 38 (H) 22 - 32 mmol/L   O2 Saturation 79 %   Acid-Base Excess 4.0 (H) 0.0 - 2.0 mmol/L   Sodium 134 (L) 135 - 145 mmol/L   Potassium 5.1 3.5 - 5.1 mmol/L   Calcium , Ion 1.07 (L) 1.15 - 1.40 mmol/L   HCT 29.0 (L) 36.0 - 46.0 %   Hemoglobin 9.9 (L)  12.0 - 15.0 g/dL   Patient temperature 00.6 F    Collection site RADIAL, ALLEN'S TEST ACCEPTABLE    Drawn by RT    Sample type ARTERIAL    Comment NOTIFIED PHYSICIAN   Glucose, capillary     Status: Abnormal   Collection Time: 07/16/24  4:43 PM  Result Value Ref Range   Glucose-Capillary 137 (H) 70 - 99 mg/dL    Comment: Glucose reference range applies only to samples taken after fasting for at least 8 hours.       Review of Systems  Respiratory:  Positive for chest tightness, shortness of breath and wheezing.   Cardiovascular:  Positive for leg swelling.  Musculoskeletal:        Generalized weakness  Skin:  Positive for wound.  Neurological:  Positive for weakness.  Hematological: Negative.   Psychiatric/Behavioral: Negative.     Blood pressure 107/87, pulse (!) 109, temperature 99.7 F (37.6 C), temperature source Axillary, resp. rate (!) 31, height 5' 1 (1.549 m), weight 76.9 kg, SpO2 94%. Physical Exam HENT:     Head: Normocephalic.     Right Ear: External ear normal.     Left Ear: External ear normal.     Mouth/Throat:     Mouth: Mucous membranes are dry.  Eyes:     Pupils: Pupils are equal, round, and reactive to light.  Neurological:     Mental  Status: She is unresponsive.     Comments: Has a cough, not following commands Not responsive to voice Unable to examine      Assessment/Plan: Katie Waller is a 78 y.o. female Who has been hospitalized since 06/25/2024. Today was the first day a real neurological exam was undertaken. She was found to be very weak, given that it is unlikely that she was able to give a full effort, the weakness was still unexplained. The MRI shows cervical stenosis without cord signal. The changes appear to be chronic. I do believe the stenosis plays a role in the weakness, but at this time the risk of surgery far outweighs the possible benefits. She has most likely been this way since close to admission. No meaningful documentation is present other than neuro intact until today. The real purpose of the operation is to prevent neurological decline. It is doubtful that improvement would be a result. I have spoken with her daughter and son and explained my rationale for not intervening emergently or urgently. They understand and I answered their questions.   Rockey Peru 07/16/2024, 5:06 PM

## 2024-07-16 NOTE — Procedures (Signed)
 Central Venous Catheter Insertion Procedure Note  Katie Waller  [id number]  05-Dec-1945  Date:07/16/24  Time:3:31 PM   Provider Performing:Kasie Leccese Waller   Procedure: Insertion of Non-tunneled Central Venous Catheter(36556)with US  guidance (23062)    Indication(s) Hemodialysis  Consent Risks of the procedure as well as the alternatives and risks of each were explained to the patient and/or caregiver.  Consent for the procedure was obtained and is signed in the bedside chart  Anesthesia Topical only with 1% lidocaine    Timeout Verified patient identification, verified procedure, site/side was marked, verified correct patient position, special equipment/implants available, medications/allergies/relevant history reviewed, required imaging and test results available.  Sterile Technique Maximal sterile technique including full sterile barrier drape, hand hygiene, sterile gown, sterile gloves, mask, hair covering, sterile ultrasound probe cover (if used).  Procedure Description Area of catheter insertion was cleaned with chlorhexidine  and draped in sterile fashion.   With real-time ultrasound guidance a HD catheter was placed into the left internal jugular vein.  Nonpulsatile blood flow and easy flushing noted in all ports.  The catheter was sutured in place and sterile dressing applied.  Complications/Tolerance None; patient tolerated the procedure well. Chest X-ray is ordered to verify placement for internal jugular or subclavian cannulation.  Chest x-ray is not ordered for femoral cannulation.  EBL Minimal  Specimen(s) None   Katie Meade, PA - [facility] For pager details, please see AMION or use Epic chat  After 1900, please call [facility] for cross coverage needs 07/16/2024, 3:31 PM

## 2024-07-16 NOTE — Progress Notes (Signed)
Pt transported to and from MRI without event. °

## 2024-07-16 NOTE — Consult Note (Addendum)
 [facility] Nephrology Consultation Note  Requesting MD: Dr. Claudene Sieving Reason for consult: AKI  HPI:  Katie Waller is a 78 y.o. 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 The patient was found unresponsive at home therefore the code stroke was called.  She was emergently intubated in ER for airway protection.  CT head with no acute finding.  The patient was found to have pneumonia/sepsis and was admitted in ICU.  She received broad-spectrum antibiotics including cefepime, vancomycin azithromycin .  Remains intubated.  She was noticed to have fluid overload and gained significant amount of weight.  She received IV Lasix  challenges multiple doses in addition to metolazone yesterday with no response.  She remains oliguric.  The baseline serum creatinine level is normal.  The creatinine level trending up to 2.89 and BUN 72 today.  The respiratory viral panel positive for metapneumovirus.  The hospital course complicated by new onset A-fib therefore she is currently on amiodarone and Angiomax.  Also required chest tube for pleural effusion.  She is currently off of antibiotics. Urinalysis has chronic protein but no active sediment.  Kidney ultrasound without hydronephrosis. We are consulted because of oligoanuria and becoming fluid overload. The patient's daughter was present at the bedside.  Discussed with her as well as with the primary ICU team.  The patient is currently intubated therefore unable to obtain review of system.  PMHx:   Past Medical History:  Diagnosis Date   Abnormal finding on EKG 09/19/2013   Acute on chronic respiratory failure with hypercapnia  (HCC)    Acute respiratory failure (HCC) 06/08/2016   Anxiety 11/09/2020   Arthritis    Asthma    Carotid bruit 11/09/2020   Chronic diastolic CHF (congestive heart failure) (HCC) 07/28/2017   Chronic obstructive pulmonary disease, unspecified (HCC) 11/09/2020   COPD, group D, by GOLD 2017 classification (HCC) 09/17/2013   Cough 11/09/2020   Decreased estrogen level 11/09/2020   Diabetes (HCC)    Dyspnea    Edema 11/09/2020   Essential hypertension 09/18/2013   Goals of care, counseling/discussion    Hardening of the aorta (main artery of the heart) 11/09/2020   Heart disease    History of radiation therapy    Left Lung- 06/20/23-06/26/23- Dr. 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   Transient ischemic attack 11/09/2020   Venous stasis of both lower extremities 06/13/2016    Past Surgical History:  Procedure Laterality Date   BRONCHIAL BIOPSY  05/25/2023   Procedure: BRONCHIAL BIOPSIES;  Surgeon: Brenna Adine LITTIE, DO;  Location: [facility];  Service: Pulmonary;;   BRONCHIAL NEEDLE ASPIRATION BIOPSY  05/25/2023   Procedure: BRONCHIAL NEEDLE ASPIRATION BIOPSIES;  Surgeon: Brenna Adine LITTIE, DO;  Location: [facility];  Service: Pulmonary;;   FIDUCIAL MARKER PLACEMENT  05/25/2023  Procedure: FIDUCIAL MARKER PLACEMENT;  Surgeon: Brenna Adine CROME, DO;  Location: [facility];  Service: Pulmonary;;   KNEE SURGERY Left     Family Hx:  Family History  Problem Relation Age of Onset   Hyperlipidemia Mother    Hypertension Mother     Hypertension Father    Hyperlipidemia Father     Social History:  reports that she quit smoking about 6 years ago. Her smoking use included cigarettes. She started smoking about 6 years ago. She has a 55 pack-year smoking history. She has never used smokeless tobacco. She reports that she does not drink alcohol and does not use drugs.  Allergies:  Allergies  Allergen Reactions   Ace Inhibitors Swelling   Diphenhydramine Other (See Comments)    Hyperactive/jittery    Doxycycline Hyclate Nausea And Vomiting   Codeine Nausea And Vomiting    Medications: Prior to Admission medications   Medication Sig Start Date End Date Taking? Authorizing Provider  acetaminophen  (TYLENOL ) 500 MG tablet Take 1,000 mg by mouth every 6 (six) hours as needed for moderate pain.   Yes [provider]  albuterol  (PROVENTIL  HFA;VENTOLIN  HFA) 108 (90 BASE) MCG/ACT inhaler Inhale 2 puffs into the lungs every 6 (six) hours as needed for wheezing or shortness of breath. 09/19/13  Yes Johnson, Clanford L, MD  aspirin  81 MG chewable tablet Chew 1 tablet (81 mg total) by mouth daily. 09/19/13  Yes Johnson, Clanford L, MD  atorvastatin  (LIPITOR) 10 MG tablet TAKE 1 TABLET BY MOUTH EVERY DAY 07/21/23  Yes Krasowski, Robert J, MD  benzonatate  (TESSALON ) 100 MG capsule Take 100 mg by mouth 3 (three) times daily as needed for cough.    Yes [provider]  Biotin 10 MG CAPS Take 10 mg by mouth daily.   Yes [provider]  budesonide -formoterol  (SYMBICORT ) 160-4.5 MCG/ACT inhaler Inhale 2 puffs into the lungs 2 (two) times daily. 08/23/19  Yes Mannam, Praveen, MD  calcium  carbonate (OSCAL) 1500 (600 Ca) MG TABS tablet Take 600 mg of elemental calcium  by mouth daily.   Yes [provider]  Cholecalciferol (VITAMIN D ) 50 MCG (2000 UT) tablet Take 2,000 Units by mouth daily.   Yes [provider]  diphenhydramine-acetaminophen  (TYLENOL  PM) 25-500 MG TABS tablet Take 1 tablet by mouth  at bedtime.   Yes [provider]  furosemide  (LASIX ) 40 MG tablet Take 40 mg by mouth daily.   Yes [provider]  gabapentin (NEURONTIN) 300 MG capsule Take 900 mg by mouth at bedtime.   Yes [provider]  iron polysaccharides (NIFEREX) 150 MG capsule Take 150 mg by mouth daily. 11/05/20  Yes [provider]  loratadine (CLARITIN) 10 MG tablet Take 10 mg by mouth daily as needed for allergies.   Yes [provider]  LORazepam  (ATIVAN ) 1 MG tablet Take 1 tablet (1 mg total) by mouth every 6 (six) hours as needed for anxiety. 08/07/17  Yes Drusilla Sabas RAMAN, MD  losartan  (COZAAR ) 50 MG tablet Take 50 mg by mouth daily.   Yes [provider]  Multiple Vitamin (MULTIVITAMIN WITH MINERALS) TABS tablet Take 1 tablet by mouth daily.   Yes [provider]  raloxifene  (EVISTA ) 60 MG tablet Take 60 mg by mouth every morning. 08/07/20  Yes [provider]  Tiotropium Bromide  Monohydrate (SPIRIVA  RESPIMAT) 2.5 MCG/ACT AERS Inhale 1 puff into the lungs daily.   Yes [provider]  traZODone  (DESYREL ) 100 MG tablet Take 1 tablet (100 mg total)  by mouth at bedtime as needed for sleep. 08/07/17  Yes Drusilla Sabas RAMAN, MD    I have reviewed the patient's current medications.  Labs: Renal Panel: Recent Labs  Lab 07/12/24 0841 07/12/24 1124 07/13/24 0335 07/13/24 2135 07/14/24 0529 07/15/24 0419 07/15/24 2059 07/16/24 0200  NA 141  --  138 139 139 139 137 138  K 4.0  --  4.1 4.3 3.8 3.9 4.6 4.4  CL 97*  --  93*  --  93* 92* 89* 88*  CO2 33*  --  37*  --  32 33* 31 31  GLUCOSE 101*  --  259*  --  201* 136* 181* 160*  BUN 11  --  21  --  39* 52* 69* 72*  CREATININE 0.65  --  0.79  --  1.44* 2.10* 2.74* 2.89*  CALCIUM  8.7*  --  8.7*  --  9.5 8.9 8.5* 8.5*  MG  --  1.9 2.1  --  2.4 3.8* 3.4*  --   PHOS 3.7 3.5 3.3  --  2.0* 6.5*  --   --      CBC:    Latest Ref Rng & Units 07/16/2024    2:00 AM 07/15/2024    8:59 PM  07/15/2024    3:55 PM  CBC  WBC 4.0 - 10.5 K/uL 11.0  11.9  12.3   Hemoglobin 12.0 - 15.0 g/dL 9.1  9.5  9.8   Hematocrit 36.0 - 46.0 % 28.6  29.9  31.5   Platelets 150 - 400 K/uL 209  224  222      Anemia Panel:  Recent Labs    07/11/24 0228 07/12/24 0841 07/14/24 2205 07/15/24 0419 07/15/24 1555 07/15/24 2059 07/16/24 0200  HGB 8.9*   < > 9.7* 9.8* 9.8* 9.5* 9.1*  MCV 100.0   < > 96.8 96.9 98.4 97.4 96.0  VITAMINB12 1,029*  --   --   --   --   --   --   FOLATE >20.0  --   --   --   --   --   --   FERRITIN 762*  --   --   --   --   --   --   TIBC 227*  --   --   --   --   --   --   IRON 44  --   --   --   --   --   --   RETICCTPCT 1.7  --   --   --   --   --   --    < > = values in this interval not displayed.    Recent Labs  Lab 07/13/24 0800 07/14/24 0529 07/15/24 0419 07/16/24 0200  AST 18 30 47* 55*  ALT 24 21 22 29   ALKPHOS 40 37* 38 43  BILITOT 0.2 0.5 0.7 0.4  PROT 5.1* 5.6* 5.3* 5.3*  ALBUMIN 2.0* 3.0* 2.9* 2.5*    Lab Results  Component Value Date   HGBA1C 5.6 06/26/2024    ROS: Unable to obtain review of system as patient is currently intubated.  Physical Exam: Vitals:   07/16/24 0800 07/16/24 0900  BP: 102/76 134/69  Pulse: 93 84  Resp: 20 (!) 24  Temp: (!) 97.5 F (36.4 C)   SpO2: 98% 98%     General exam: Critically ill looking female, unresponsive, intubated.  Multiple lines. Respiratory system: Coarse breath sound bilateral. Cardiovascular system: Tachycardic, S1 & S2  heard.  Bilateral upper and lower extremities edema present Gastrointestinal system: Abdomen is distended, soft  Central nervous system: Intubated and not following commands Extremities: Edema present, no cyanosis Skin: No rashes, lesions or ulcers Psychiatry: Unable to assess.   Assessment/Plan:  # Anuric acute kidney injury with evidence of fluid overload likely due to sepsis/pneumonia and the use of antibiotics.  The patient has third spacing of fluid however  she likely has intravascular volume depletion. She did not respond with high-dose IV diuretics.  UA and kidney ultrasound unremarkable. - Plan to start CRRT today after placement of HD catheter by ICU team.  All 4K bath, UF around 100 cc an hour as tolerated by BP. - Continue with strict ins and out and lab monitoring.  Adjust CRRT prescription based on the lab results and clinical response. - Discussed with the patient's daughter and agreed with the plan above.  Also informed her that she may not be a great candidate for outpatient dialysis because of tracheostomy.  We will continue to monitor and have ongoing discussion about goals of care.  # Acute on chronic hypoxic respiratory failure, metapneumovirus pneumonia: Initially treated with broad-spectrum antibiotics which was discontinued later.  Patient required tracheostomy on 11/9 and managing by pulmonary team.  # Bilateral pleural effusion, fluid overload: Patient required chest tube which was removed later, managing volume with CRRT.  Refractory to diuretics.  # Anemia: Hemoglobin is around 9-9.8.  Monitor and transfuse as needed.  Discussed with the patient's daughter and ICU team. Thank you for the consult, we will continue to follow.  Jerelene Salaam Amelie Romney 07/16/2024, 10:28 AM  [facility].

## 2024-07-16 NOTE — Procedures (Signed)
 Insertion of Chest Tube Procedure Note  TERRIAN RIDLON  [id number]  02/26/1946  Date:07/16/24  Time:6:46 PM    Provider Performing: Sammi Gore   Procedure: Chest Tube Insertion (32551)  Indication(s) Pneumothorax  Consent Risks of the procedure as well as the alternatives and risks of each were explained to the patient and/or caregiver.  Consent for the procedure was obtained and is signed in the bedside chart  Anesthesia Topical only with 1% lidocaine     Time Out Verified patient identification, verified procedure, site/side was marked, verified correct patient position, special equipment/implants available, medications/allergies/relevant history reviewed, required imaging and test results available.   Sterile Technique Maximal sterile technique including full sterile barrier drape, hand hygiene, sterile gown, sterile gloves, mask, hair covering, sterile ultrasound probe cover (if used).   Procedure Description Ultrasound not used to identify appropriate pleural anatomy for placement and overlying skin marked. Area of placement cleaned and draped in sterile fashion.  A 14 French pigtail pleural catheter was placed into the right pleural space using Seldinger technique. Appropriate return of air was obtained.  The tube was connected to atrium and placed on -20 cm H2O wall suction.   Complications/Tolerance None; patient tolerated the procedure well. Chest X-ray is ordered to verify placement.   EBL Minimal  Specimen(s) none   Sammi Gore, PA - [facility] For pager details, please see AMION or use Epic chat  After 1900, please call [facility] for cross coverage needs 07/16/2024, 6:46 PM

## 2024-07-16 NOTE — Progress Notes (Addendum)
 Patient appears more alert according to family this AM. Patient opens eyes to voice, follows voice from side to side, sticks out tongue when asked and nods head to certain questions such as whether something is painful. Transitioned pt from IV to oral amiodarone.  Patient tolerated MRI at 1100. MRI results discussed with family by MD Claudene and stopped bival drip per MD. Patient more lethargic after MRI, still opens eyes to voice.  Around 1500 PA-C Rahul Desai placed dialysis catheter to allow patient to get continuous renal replacement therapy. EKG was taken due to patient's HR already being irregular and jumping to 130s after the procedure. Dialysis catheter placement confirmed with x-ray. Patient still more lethargic than AM, opens eyes to physical stimuli.

## 2024-07-16 NOTE — Progress Notes (Signed)
 PHARMACY - ANTICOAGULATION CONSULT NOTE  Pharmacy Consult:  Bivalirudin  Indication: atrial fibrillation and internal jugular thombus   Allergies  Allergen Reactions   Ace Inhibitors Swelling   Diphenhydramine Other (See Comments)    Hyperactive/jittery    Doxycycline Hyclate Nausea And Vomiting   Codeine Nausea And Vomiting    Patient Measurements: Height: 5' 1 (154.9 cm) Weight: 76.9 kg (169 lb 8.5 oz) IBW/kg (Calculated) : 47.8 HEPARIN DW (KG): 63.1  Vital Signs: Temp: 98 F (36.7 C) (11/11 2000) Temp Source: Axillary (11/11 2000) BP: 110/67 (11/11 2115) Pulse Rate: 109 (11/11 2115)  Labs: Recent Labs    07/15/24 1555 07/15/24 2000 07/15/24 2059 07/16/24 0200 07/16/24 0827 07/16/24 1558 07/16/24 1623 07/16/24 1736 07/16/24 2100  HGB 9.8*  --  9.5* 9.1*  --  9.9*  --  9.9*  --   HCT 31.5*  --  29.9* 28.6*  --  29.0*  --  29.0*  --   PLT 222  --  224 209  --   --   --   --   --   APTT 80*   < >  --  100* 67*  --   --   --  53*  CREATININE  --   --  2.74* 2.89*  --   --  3.66*  --   --    < > = values in this interval not displayed.    Estimated Creatinine Clearance: 12.5 mL/min (A) (by C-G formula based on SCr of 3.66 mg/dL (H)).   Medical History: Past Medical History:  Diagnosis Date   Abnormal finding on EKG 09/19/2013   Acute on chronic respiratory failure with hypercapnia (HCC)    Acute respiratory failure (HCC) 06/08/2016   Anxiety 11/09/2020   Arthritis    Asthma    Carotid bruit 11/09/2020   Chronic diastolic CHF (congestive heart failure) (HCC) 07/28/2017   Chronic obstructive pulmonary disease, unspecified (HCC) 11/09/2020   COPD, group D, by GOLD 2017 classification (HCC) 09/17/2013   Cough 11/09/2020   Decreased estrogen level 11/09/2020   Diabetes (HCC)    Dyspnea    Edema 11/09/2020   Essential hypertension 09/18/2013   Goals of care, counseling/discussion    Hardening of the aorta (main artery of the heart) 11/09/2020   Heart  disease    History of radiation therapy    Left Lung- 06/20/23-06/26/23- Dr. 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  She was previously on IV heparin and was switched to bivalirudin due to labile heparin levels.  Med was stopped because it was thought that Afib was secondary to critical illness, which resolved. However patient went back into Afib w/ RVR on 11/7.   HgB stable this AM after transfusion of two units on 11/9. HgB 9.7 and PLTs 2344. No s/sx of bleeding. Discussed with CCM will resume bival.   11/11 pm: Therapeutic aPTT  53. No pauses or bleeding issues.  Goal of Therapy:  aPTT 50-85 seconds Monitor platelets by anticoagulation protocol: Yes   Plan:  Continue bivalrudin to 0.1 mg/kg/hr.  Using weight of 66 kg which is what weight had been used previously while patient was therapeutic on bival.  aPTT BID.  Daily CBC   Larraine Brazier, PharmD Clinical Pharmacist 07/16/2024  10:06 PM **Pharmacist phone directory can now be found on [email or web address] ([email or web address]).  Listed under [facility].

## 2024-07-16 NOTE — Plan of Care (Signed)
  Problem: Clinical Measurements: Goal: Ability to maintain clinical measurements within normal limits will improve Outcome: Progressing Goal: Will remain free from infection Outcome: Progressing Goal: Diagnostic test results will improve Outcome: Progressing Goal: Cardiovascular complication will be avoided Outcome: Progressing   Problem: Activity: Goal: Risk for activity intolerance will decrease Outcome: Progressing   Problem: Nutrition: Goal: Adequate nutrition will be maintained Outcome: Progressing   Problem: Coping: Goal: Level of anxiety will decrease Outcome: Progressing   Problem: Pain Managment: Goal: General experience of comfort will improve and/or be controlled Outcome: Progressing   Problem: Safety: Goal: Ability to remain free from injury will improve Outcome: Progressing

## 2024-07-16 NOTE — Procedures (Signed)
 Insertion of Chest Tube Procedure Note  JAYDI BRAY  [id number]  [birth year]  Date:07/16/24  Time:10:02 PM    Provider Performing: Lenny Drought   Procedure: Chest Tube Insertion (32551)  Indication(s) Pneumothorax  Consent Risks of the procedure as well as the alternatives and risks of each were explained to the patient and/or caregiver.  Consent for the procedure was obtained and is signed in the bedside chart  Anesthesia Topical only with 1% lidocaine     Time Out Verified patient identification, verified procedure, site/side was marked, verified correct patient position, special equipment/implants available, medications/allergies/relevant history reviewed, required imaging and test results available.   Sterile Technique Maximal sterile technique including full sterile barrier drape, hand hygiene, sterile gown, sterile gloves, mask, hair covering, sterile ultrasound probe cover (if used).   Procedure Description Ultrasound used to identify appropriate pleural anatomy for placement and overlying skin marked. Area of placement cleaned and draped in sterile fashion.  A 14 French pigtail pleural catheter was placed into the right pleural space using Seldinger technique. Appropriate return of gush of air was obtained.  The tube was connected to atrium and placed on -20 cm H2O wall suction.   Complications/Tolerance None; patient tolerated the procedure well. Chest X-ray is ordered to verify placement.   EBL Less than   Specimen(s) none

## 2024-07-17 ENCOUNTER — Inpatient Hospital Stay (HOSPITAL_COMMUNITY)

## 2024-07-17 DIAGNOSIS — J9621 Acute and chronic respiratory failure with hypoxia: Secondary | ICD-10-CM | POA: Diagnosis not present

## 2024-07-17 DIAGNOSIS — J9622 Acute and chronic respiratory failure with hypercapnia: Secondary | ICD-10-CM | POA: Diagnosis not present

## 2024-07-17 DIAGNOSIS — I480 Paroxysmal atrial fibrillation: Secondary | ICD-10-CM | POA: Diagnosis not present

## 2024-07-17 DIAGNOSIS — A419 Sepsis, unspecified organism: Secondary | ICD-10-CM | POA: Diagnosis not present

## 2024-07-17 LAB — GLUCOSE, CAPILLARY
Glucose-Capillary: 117 mg/dL — ABNORMAL HIGH (ref 70–99)
Glucose-Capillary: 128 mg/dL — ABNORMAL HIGH (ref 70–99)
Glucose-Capillary: 129 mg/dL — ABNORMAL HIGH (ref 70–99)
Glucose-Capillary: 149 mg/dL — ABNORMAL HIGH (ref 70–99)
Glucose-Capillary: 152 mg/dL — ABNORMAL HIGH (ref 70–99)
Glucose-Capillary: 196 mg/dL — ABNORMAL HIGH (ref 70–99)

## 2024-07-17 LAB — RENAL FUNCTION PANEL
Albumin: 2.4 g/dL — ABNORMAL LOW (ref 3.5–5.0)
Anion gap: 13 (ref 5–15)
BUN: 38 mg/dL — ABNORMAL HIGH (ref 8–23)
CO2: 26 mmol/L (ref 22–32)
Calcium: 8.5 mg/dL — ABNORMAL LOW (ref 8.9–10.3)
Chloride: 95 mmol/L — ABNORMAL LOW (ref 98–111)
Creatinine, Ser: 1.51 mg/dL — ABNORMAL HIGH (ref 0.44–1.00)
GFR, Estimated: 36 mL/min — ABNORMAL LOW (ref >60)
Glucose, Bld: 170 mg/dL — ABNORMAL HIGH (ref 70–99)
Phosphorus: 2 mg/dL — ABNORMAL LOW (ref 2.5–4.6)
Potassium: 4.7 mmol/L (ref 3.5–5.1)
Sodium: 134 mmol/L — ABNORMAL LOW (ref 135–145)

## 2024-07-17 LAB — COMPREHENSIVE METABOLIC PANEL WITH GFR
ALT: 35 U/L (ref 0–44)
AST: 52 U/L — ABNORMAL HIGH (ref 15–41)
Albumin: 2.4 g/dL — ABNORMAL LOW (ref 3.5–5.0)
Alkaline Phosphatase: 47 U/L (ref 38–126)
Anion gap: 14 (ref 5–15)
BUN: 54 mg/dL — ABNORMAL HIGH (ref 8–23)
CO2: 27 mmol/L (ref 22–32)
Calcium: 8 mg/dL — ABNORMAL LOW (ref 8.9–10.3)
Chloride: 98 mmol/L (ref 98–111)
Creatinine, Ser: 2.03 mg/dL — ABNORMAL HIGH (ref 0.44–1.00)
GFR, Estimated: 25 mL/min — ABNORMAL LOW (ref >60)
Glucose, Bld: 183 mg/dL — ABNORMAL HIGH (ref 70–99)
Potassium: 5.4 mmol/L — ABNORMAL HIGH (ref 3.5–5.1)
Sodium: 139 mmol/L (ref 135–145)
Total Bilirubin: 0.6 mg/dL (ref 0.0–1.2)
Total Protein: 5.5 g/dL — ABNORMAL LOW (ref 6.5–8.1)

## 2024-07-17 LAB — POCT I-STAT 7, (LYTES, BLD GAS, ICA,H+H)
Acid-Base Excess: 2 mmol/L (ref 0.0–2.0)
Bicarbonate: 30.1 mmol/L — ABNORMAL HIGH (ref 20.0–28.0)
Calcium, Ion: 1.06 mmol/L — ABNORMAL LOW (ref 1.15–1.40)
HCT: 29 % — ABNORMAL LOW (ref 36.0–46.0)
Hemoglobin: 9.9 g/dL — ABNORMAL LOW (ref 12.0–15.0)
O2 Saturation: 98 %
Patient temperature: 98.3
Potassium: 5.3 mmol/L — ABNORMAL HIGH (ref 3.5–5.1)
Sodium: 133 mmol/L — ABNORMAL LOW (ref 135–145)
TCO2: 32 mmol/L (ref 22–32)
pCO2 arterial: 69.6 mmHg (ref 32–48)
pH, Arterial: 7.244 — ABNORMAL LOW (ref 7.35–7.45)
pO2, Arterial: 119 mmHg — ABNORMAL HIGH (ref 83–108)

## 2024-07-17 LAB — CBC
HCT: 28.7 % — ABNORMAL LOW (ref 36.0–46.0)
Hemoglobin: 9.1 g/dL — ABNORMAL LOW (ref 12.0–15.0)
MCH: 30.5 pg (ref 26.0–34.0)
MCHC: 31.7 g/dL (ref 30.0–36.0)
MCV: 96.3 fL (ref 80.0–100.0)
Platelets: 205 K/uL (ref 150–400)
RBC: 2.98 MIL/uL — ABNORMAL LOW (ref 3.87–5.11)
RDW: 15.6 % — ABNORMAL HIGH (ref 11.5–15.5)
WBC: 8.8 K/uL (ref 4.0–10.5)
nRBC: 1.6 % — ABNORMAL HIGH (ref 0.0–0.2)

## 2024-07-17 LAB — APTT
aPTT: 64 s — ABNORMAL HIGH (ref 24–36)
aPTT: 71 s — ABNORMAL HIGH (ref 24–36)

## 2024-07-17 LAB — CYTOLOGY - NON PAP

## 2024-07-17 LAB — PHOSPHORUS: Phosphorus: 3.4 mg/dL (ref 2.5–4.6)

## 2024-07-17 LAB — MAGNESIUM: Magnesium: 3.2 mg/dL — ABNORMAL HIGH (ref 1.7–2.4)

## 2024-07-17 MED ORDER — PRISMASOL BGK 2/3.5 32-2-3.5 MEQ/L EC SOLN: Status: DC

## 2024-07-17 MED ORDER — ACETAMINOPHEN 500 MG PO TABS
1000.0000 mg | ORAL_TABLET | Freq: Three times a day (TID) | ORAL | Status: DC
  Administered 2024-07-17 – 2024-07-25 (×23): 1000 mg
  Filled 2024-07-17 (×23): qty 2

## 2024-07-17 MED ORDER — PROSOURCE TF20 ENFIT COMPATIBL EN LIQD
60.0000 mL | Freq: Three times a day (TID) | ENTERAL | Status: DC
  Administered 2024-07-17 – 2024-07-18 (×4): 60 mL
  Filled 2024-07-17 (×4): qty 60

## 2024-07-17 MED ORDER — HYDROMORPHONE HCL 1 MG/ML IJ SOLN
0.5000 mg | INTRAMUSCULAR | Status: DC | PRN
  Administered 2024-07-17 – 2024-07-18 (×3): 0.5 mg via INTRAVENOUS
  Filled 2024-07-17 (×3): qty 1

## 2024-07-17 MED ORDER — GABAPENTIN 100 MG PO CAPS: 100.0000 mg | ORAL_CAPSULE | Freq: Three times a day (TID) | ORAL | Status: DC

## 2024-07-17 MED ORDER — JUVEN PO PACK
1.0000 | PACK | Freq: Two times a day (BID) | ORAL | Status: DC
  Administered 2024-07-18 – 2024-07-22 (×4): 1
  Filled 2024-07-17 (×4): qty 1

## 2024-07-17 MED ORDER — METHOCARBAMOL 500 MG PO TABS
500.0000 mg | ORAL_TABLET | Freq: Three times a day (TID) | ORAL | Status: DC
  Administered 2024-07-17 – 2024-07-18 (×4): 500 mg
  Filled 2024-07-17 (×4): qty 1

## 2024-07-17 MED ORDER — OXYCODONE HCL 5 MG PO TABS
5.0000 mg | ORAL_TABLET | Freq: Four times a day (QID) | ORAL | Status: DC | PRN
  Administered 2024-07-17 – 2024-07-18 (×4): 5 mg
  Filled 2024-07-17 (×5): qty 1

## 2024-07-17 MED ORDER — SODIUM CHLORIDE 0.9% FLUSH
10.0000 mL | Freq: Three times a day (TID) | INTRAVENOUS | Status: DC
  Administered 2024-07-17 – 2024-07-31 (×42): 10 mL via INTRAPLEURAL

## 2024-07-17 MED ORDER — HYDRALAZINE HCL 20 MG/ML IJ SOLN
2.0000 mg | INTRAMUSCULAR | Status: DC | PRN
  Administered 2024-07-17: 5 mg via INTRAVENOUS
  Filled 2024-07-17: qty 1

## 2024-07-17 MED ORDER — B COMPLEX-C PO TABS
1.0000 | ORAL_TABLET | Freq: Every day | ORAL | Status: DC
  Administered 2024-07-17 – 2024-07-21 (×4): 1
  Filled 2024-07-17 (×6): qty 1

## 2024-07-17 MED ORDER — HYDRALAZINE HCL 20 MG/ML IJ SOLN
10.0000 mg | INTRAMUSCULAR | Status: DC | PRN
Start: 2024-07-17 — End: 2024-08-01
  Administered 2024-07-17 – 2024-07-27 (×3): 10 mg via INTRAVENOUS
  Filled 2024-07-17 (×3): qty 1

## 2024-07-17 MED ORDER — SODIUM PHOSPHATES 45 MMOLE/15ML IV SOLN
15.0000 mmol | Freq: Once | INTRAVENOUS | Status: AC
  Administered 2024-07-17: 15 mmol via INTRAVENOUS
  Filled 2024-07-17: qty 5

## 2024-07-17 MED ORDER — GABAPENTIN 250 MG/5ML PO SOLN
100.0000 mg | Freq: Three times a day (TID) | ORAL | Status: DC
  Administered 2024-07-17 – 2024-07-18 (×3): 100 mg
  Filled 2024-07-17 (×5): qty 2

## 2024-07-17 NOTE — Plan of Care (Signed)
  Problem: Nutrition: Goal: Adequate nutrition will be maintained Outcome: Progressing   Problem: Fluid Volume: Goal: Ability to maintain a balanced intake and output will improve Outcome: Progressing   Problem: Skin Integrity: Goal: Risk for impaired skin integrity will decrease Outcome: Not Progressing   Problem: Fluid Volume: Goal: Hemodynamic stability will improve Outcome: Not Progressing

## 2024-07-17 NOTE — Progress Notes (Addendum)
 eLink Physician-Brief Progress Note Patient Name: Katie Waller DOB: 1945/09/24 MRN: [id number]   Date of Service  07/17/2024  HPI/Events of Note  Patient with dramatic drop in BP following 10 mg of PRN labetalol  for a SBP of 170.  eICU Interventions  Will discontinue Labetalol  and substitute Hydralazine  2-5 mg iv Q 4 hours PRN SBP > 180 while on CRRT.        Alexiz Sustaita U Ousman Dise 07/17/2024, 4:20 AM

## 2024-07-17 NOTE — Progress Notes (Signed)
 [facility] NEPHROLOGY PROGRESS NOTE  Assessment/ Plan: Pt is a 78 y.o. 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   # Anuric acute kidney injury with evidence of fluid overload likely due to sepsis/pneumonia and the use of antibiotics. She did not respond with high-dose IV diuretics.  UA and kidney ultrasound unremarkable. -Started CRRT on 11/11 after placement of left IJ temporary HD catheter. -Mildly hyperkalemic this morning therefore changed pre and post filter to 2K bath, continue dialysis at [facility].  UF around 100 cc an hour as tolerated by BP.  Currently on Angiomax for anticoagulation. - Continue with strict ins and out and lab monitoring.  Adjust CRRT prescription based on the lab results and clinical response.   # Acute on chronic hypoxic respiratory failure, metapneumovirus pneumonia: Initially treated with broad-spectrum antibiotics which was discontinued later.  Patient required tracheostomy on 11/9 and managing by pulmonary team.   # Bilateral pleural effusion, fluid overload: Patient required chest tube and has been managing by pulmonary team.  # Anemia: Hemoglobin is around 9-9.8.  Monitor and transfuse as needed.  Subjective: Seen and examined in ICU.  Tolerating CRRT well, not on any pressors.  Her daughter at the bedside.  UF around 50-100 cc an hour.  Discussed with ICU nurse.  The patient looks more alert awake today and following simple commands.  No significant urine output. Objective Vital signs in last 24 hours: Vitals:   07/17/24 0918 07/17/24 1000 07/17/24 1030 07/17/24 1117  BP:  (!) 114/55  124/60  Pulse: (!) 111 83 96 (!) 113  Resp: (!)  28 (!) 40 (!) 27 (!) 33  Temp:      TempSrc:      SpO2: 100% 100% 100%   Weight:      Height:       Weight change:   Intake/Output Summary (Last 24 hours) at 07/17/2024 1202 Last data filed at 07/17/2024 1100 Gross per 24 hour  Intake 1861.67 ml  Output 2743.6 ml  Net -881.93 ml       Labs: RENAL PANEL Recent Labs  Lab 07/13/24 0335 07/13/24 0800 07/14/24 0529 07/15/24 0419 07/15/24 2059 07/16/24 0200 07/16/24 1558 07/16/24 1623 07/16/24 1736 07/16/24 2314 07/17/24 0105 07/17/24 0458  NA 138   < > 139 139 137 138   < > 140 132* 132* 133* 139  K 4.1   < > 3.8 3.9 4.6 4.4   < > 5.3* 5.1 5.5* 5.3* 5.4*  CL 93*  --  93* 92* 89* 88*  --  91*  --   --   --  98  CO2 37*  --  32 33* 31 31  --  29  --   --   --  27  GLUCOSE 259*  --  201* 136* 181* 160*  --  141*  --   --   --  183*  BUN 21  --  39* 52* 69* 72*  --  92*  --   --   --  54*  CREATININE 0.79  --  1.44* 2.10* 2.74* 2.89*  --  3.66*  --   --   --  2.03*  CALCIUM  8.7*  --  9.5 8.9  8.5* 8.5*  --  8.4*  --   --   --  8.0*  MG 2.1  --  2.4 3.8* 3.4*  --   --   --   --   --   --  3.2*  PHOS 3.3  --  2.0* 6.5*  --   --   --  8.3*  --   --   --  3.4  ALBUMIN  --    < > 3.0* 2.9*  --  2.5*  --  2.5*  --   --   --  2.4*   < > = values in this interval not displayed.    Liver Function Tests: Recent Labs  Lab 07/15/24 0419 07/16/24 0200 07/16/24 1623 07/17/24 0458  AST 47* 55*  --  52*  ALT 22 29  --  35  ALKPHOS 38 43  --  47  BILITOT 0.7 0.4  --  0.6  PROT 5.3* 5.3*  --  5.5*  ALBUMIN 2.9* 2.5* 2.5* 2.4*   Recent Labs  Lab 07/13/24 0800  LIPASE 99*   No results for input(s): AMMONIA in the last 168 hours. CBC: Recent Labs    07/11/24 0228 07/12/24 0841 07/16/24 0200 07/16/24 1558 07/16/24 1736 07/16/24 2314 07/17/24 0105 07/17/24 0458  HGB 8.9*   < > 9.1* 9.9* 9.9* 9.5* 9.9* 9.1*  MCV 100.0   < > 96.0  --   --   --   --  96.3  VITAMINB12 1,029*  --   --   --   --   --   --   --   FOLATE  >20.0  --   --   --   --   --   --   --   FERRITIN 762*  --   --   --   --   --   --   --   TIBC 227*  --   --   --   --   --   --   --   IRON 44  --   --   --   --   --   --   --   RETICCTPCT 1.7  --   --   --   --   --   --   --    < > = values in this interval not displayed.    Cardiac Enzymes: No results for input(s): CKTOTAL, CKMB, CKMBINDEX, TROPONINI in the last 168 hours. CBG: Recent Labs  Lab 07/16/24 1942 07/16/24 2355 07/17/24 0337 07/17/24 0752 07/17/24 1146  GLUCAP 107* 146* 196* 129* 152*    Iron Studies: No results for input(s): IRON, TIBC, TRANSFERRIN, FERRITIN in the last 72 hours. Studies/Results:   Medications: Infusions:  bivalirudin (ANGIOMAX) 250 mg in sodium chloride  0.9 % 500 mL (0.5 mg/mL) infusion 0.1 mg/kg/hr (07/17/24 1100)   feeding supplement (VITAL 1.5 CAL) 45 mL/hr at 07/17/24 1100   norepinephrine (LEVOPHED) Adult infusion Stopped (07/17/24 0158)   PrismaSol BGK 2/3.5 400 mL/hr at 07/17/24 0840   PrismaSol BGK 2/3.5 400 mL/hr at 07/17/24 0837   prismasol BGK 4/2.5 1,500 mL/hr at 07/17/24 9346    Scheduled Medications:  sodium chloride    Intravenous Once   acetaminophen   1,000 mg Per Tube Q8H   amiodarone  200 mg Per Tube Daily   arformoterol   15 mcg Nebulization BID   Chlorhexidine  Gluconate Cloth  6 each Topical Daily   feeding supplement (PROSource TF20)  60  mL Per Tube Daily   fiber supplement (BANATROL TF)  60 mL Per Tube BID   gabapentin  100 mg Per Tube Q8H   insulin aspart  0-9 Units Subcutaneous Q4H   lidocaine -EPINEPHrine  20 mL Intradermal Once   methocarbamol  500 mg Per Tube TID   mouth rinse  15 mL Mouth Rinse Q2H   pantoprazole (PROTONIX) IV  40 mg Intravenous Q24H   polyethylene glycol  17 g Per Tube Daily   revefenacin  175 mcg Nebulization Daily   sodium chloride  flush  10 mL Intrapleural Q8H   sodium chloride  flush  10-40 mL Intracatheter Q12H   sodium chloride  HYPERTONIC  4 mL Nebulization Q6H     have reviewed scheduled and prn medications.  Physical Exam: General: Ill looking female, trach on vent, alert awake and following simple commands Heart:RRR, s1s2 nl Lungs: Coarse breath sound bilateral. Abdomen:soft, Non-tender, non-distended Extremities: Trace peripheral dependent edema present Dialysis Access: Left temporary HD catheter  Devaughn Savant Amelie Romney 07/17/2024,12:02 PM  LOS: 22 days

## 2024-07-17 NOTE — Progress Notes (Signed)
 PHARMACY - ANTICOAGULATION CONSULT NOTE  Pharmacy Consult:  Bivalirudin  Indication: atrial fibrillation and internal jugular thombus   Allergies  Allergen Reactions   Ace Inhibitors Swelling   Diphenhydramine Other (See Comments)    Hyperactive/jittery    Doxycycline Hyclate Nausea And Vomiting   Codeine Nausea And Vomiting    Patient Measurements: Height: 5' 1 (154.9 cm) Weight: 75.3 kg (166 lb 0.1 oz) IBW/kg (Calculated) : 47.8 HEPARIN DW (KG): 63.1  Vital Signs: Temp: 98.1 F (36.7 C) (11/12 0400) Temp Source: Axillary (11/12 0400) BP: 140/58 (11/12 0700) Pulse Rate: 84 (11/12 0700)  Labs: Recent Labs    07/15/24 2059 07/16/24 0200 07/16/24 0827 07/16/24 1558 07/16/24 1623 07/16/24 1736 07/16/24 2100 07/16/24 2314 07/17/24 0105 07/17/24 0458  HGB 9.5* 9.1*  --    < >  --    < >  --  9.5* 9.9* 9.1*  HCT 29.9* 28.6*  --    < >  --    < >  --  28.0* 29.0* 28.7*  PLT 224 209  --   --   --   --   --   --   --  205  APTT  --  100* 67*  --   --   --  53*  --   --  64*  CREATININE 2.74* 2.89*  --   --  3.66*  --   --   --   --  2.03*   < > = values in this interval not displayed.    Estimated Creatinine Clearance: 22.2 mL/min (A) (by C-G formula based on SCr of 2.03 mg/dL (H)).   Medical History: Past Medical History:  Diagnosis Date   Abnormal finding on EKG 09/19/2013   Acute on chronic respiratory failure with hypercapnia (HCC)    Acute respiratory failure (HCC) 06/08/2016   Anxiety 11/09/2020   Arthritis    Asthma    Carotid bruit 11/09/2020   Chronic diastolic CHF (congestive heart failure) (HCC) 07/28/2017   Chronic obstructive pulmonary disease, unspecified (HCC) 11/09/2020   COPD, group D, by GOLD 2017 classification (HCC) 09/17/2013   Cough 11/09/2020   Decreased estrogen level 11/09/2020   Diabetes (HCC)    Dyspnea    Edema 11/09/2020   Essential hypertension 09/18/2013   Goals of care, counseling/discussion    Hardening of the aorta (main  artery of the heart) 11/09/2020   Heart disease    History of radiation therapy    Left Lung- 06/20/23-06/26/23- Dr. 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  She was previously on IV heparin and was switched to bivalirudin due to labile heparin levels.  Med was stopped because it was thought that Afib was secondary to critical illness, which resolved. However patient went back into Afib w/ RVR on 11/7.   HgB stable this AM after transfusion of two units on 11/9. HgB 9.7 and  PLTs 2344. No s/sx of bleeding. Discussed with CCM will resume bival.   aPTT remains therapeutic at 64 on current rate.   Goal of Therapy:  aPTT 50-85 seconds Monitor platelets by anticoagulation protocol: Yes   Plan:  Continue bivalrudin to 0.1 mg/kg/hr.  Using weight of 66 kg which is what weight had been used previously while patient was therapeutic on bival.  aPTT BID.  Daily CBC  Harlene Boga, PharmD, BCPS, BCCCP Clinical Pharmacist 07/17/2024  7:34 AM **Pharmacist phone directory can now be found on [email or web address] ([email or web address]).  Listed under [facility].

## 2024-07-17 NOTE — Progress Notes (Signed)
 Nutrition Follow-up  DOCUMENTATION CODES:   Not applicable  INTERVENTION:   Tube feeding via Cortrak tube: Vital 1.5 at 45 ml/h (1080 ml per day)  Increase Prosource TF20 to 60 ml TID  Provides 1880 kcal, 132 gm protein, 820 ml free water daily  Continue Banatrol TF20 BID  Add Juven BID and b-complex daily   NUTRITION DIAGNOSIS:   Inadequate oral intake related to acute illness as evidenced by NPO status. Ongoing.   GOAL:   Patient will meet greater than or equal to 90% of their needs Not met due to CRRT initiation   MONITOR:   Vent status, Labs, Weight trends, TF tolerance  REASON FOR ASSESSMENT:   Consult  (CRRT)  ASSESSMENT:   Pt admitted with AMS, sepsis, ARF. 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  Pt discussed during ICU rounds and with RN and MD.  Beatris with family at bedside. Per family you could not see pt's neck yesterday prior to starting CRRT but after can now see her neck with edema reducing. Per RN pt still with significant edema but has decreased since starting CRRT. Will need repeat NFPE as edema reduces. Per family bm have decreased.   Vent settings have reduced from 100% fIO2 now down to 40% fIO2. RN reports now more aggressively pulling fluid.   Per chart review it appears that pt went 5 days without adequate nutrition, once initiated went 8 days with adequate nutrition, then another 6 days without adequate nutrition and now with adequate nutrition for the last 5 days. (11 days without adequate nutrition and 13 days with adequate nutrition). Noted wounds worsening per documentation.    10/21 - admitted d/t PNA, HMPV infection; intubated; stroke/sz ruled out 10/23 - failed SBT, TF initiated 10/24 - extubated 10/25 - re-intubated 11/2 - extubated, no access for TF 11/03 - advanced to clear liquid diet 11/04 - advanced to full liquid  diet 11/05 - transfer out of ICU; photo of sacral wound; doc as DTI 11/07 - Cortrak placed, TF started 11/08 - hypoxic; transfer to ICU (4N) 11/09 - trach placed; echo: dilated IVC and RA pressures 11/12 - FMS inserted, only small amount of stool in tubing; noted sacral wound documented as stage II  Medications reviewed and include: banatrol TF20 BID (11/7), SSI every 4 hours, protonix, miralax  Bivalirudin   Labs reviewed:  K 4.4 - 5.3 (11/11) -> 5.4 Cr 0.79 -> 3.66 -> 2.03 Phos: 3.3 -> 8.3 -> 3.4 (decreased after CRRT) CBG's: 107-196   UOP: 1250 ml -> 1000 ml -> 700 ml -> 70 ml  -> 53 ml  Chest tube: 0 ml  UF: 1862 ml -> 1930 ml   Admission weight documented as 71 kg (question accuracy); Pt has been as low as 64.2 kg but went up to 76.9 kg before starting CRRT.  Noted per RN assessment pt with deep pitting edema to all extremities   NUTRITION - FOCUSED PHYSICAL EXAM:  Complete at next follow up. Pt sleeping and family prefers not to disturb.   Diet Order:   Diet Order             Diet NPO time specified  Diet effective now                   EDUCATION NEEDS:   Not appropriate for education at this time  Skin:  Skin Assessment: Skin Integrity Issues: Skin Integrity Issues::  DTI, Stage II DTI: L heel Stage II: sacrum  Last BM:  11/12 FMS inserted only small amount in tubing  Height:   Ht Readings from Last 1 Encounters:  06/30/24 5' 1 (1.549 m)    Weight:   Wt Readings from Last 1 Encounters:  07/17/24 75.3 kg    Ideal Body Weight:  47.7 kg  BMI:  Body mass index is 31.37 kg/m.  Estimated Nutritional Needs:   Kcal:  1700-1900  Protein:  110-125 grams  Fluid:  >/=1.5L  Eoin Willden P., RD, LDN, CNSC See AMiON for contact information

## 2024-07-17 NOTE — Progress Notes (Signed)
 NAMESKYA Waller, MRN:  [id number], DOB:  07-20-1946, LOS: 22 ADMISSION DATE:  06/25/2024, CONSULTATION DATE:  06/25/2024 REFERRING MD:  Theodoro SAUNDERS, CHIEF COMPLAINT:  AMS, Sepsis, AR  History of Present Illness:  Katie Waller is a 78 yo female with past medical history significant for HTN, COPD, acute on chronic hypoxic, hypercapnic respiratory failure on 5-6L [place] baseline, diastolic heart failure, SCC Left lower lobe s/p radiation in September 2025, who presented via EMS after being found unresponsive at home. Code stroke called. On arrival to ED patient emergently intubated for acute respiratory distress and airway protection. CT Head with no ICH or large infarct, CTA head and neck with no LVO. CXR concerning for a Pneumonia with visible BL lung apices on CTA demonstrating infection/inflammation. Code stroke cancelled with presentation suspicious for sepsis. Patient received 30ml/kg crystalloid, pan cultured, and started empirically on Cefepime/Vanc/Azithromycin . Of note, family reports patient has recently not felt well and likely has had a cold. PCCM consulted for ICU admission.   On exam in ED, patient febrile to >101 and hypotensive, likely multifactorial in setting of presumed sepsis and recent sedation on induction with underlying heart disease. Norepinephrine gtt was initiated for MAPs < 65 and patient was transferred to ICU with plans to gain central access for vasopressor administration and arterial line placement for hemodynamic monitoring.  Pertinent  Medical History   Past Medical History:  Diagnosis Date   Abnormal finding on EKG 09/19/2013   Acute on chronic respiratory failure with hypercapnia (HCC)    Acute respiratory failure (HCC) 06/08/2016   Anxiety 11/09/2020   Arthritis    Asthma    Carotid bruit 11/09/2020   Chronic diastolic CHF (congestive heart failure) (HCC) 07/28/2017   Chronic obstructive pulmonary disease, unspecified (HCC) 11/09/2020   COPD, group D,  by GOLD 2017 classification (HCC) 09/17/2013   Cough 11/09/2020   Decreased estrogen level 11/09/2020   Diabetes (HCC)    Dyspnea    Edema 11/09/2020   Essential hypertension 09/18/2013   Goals of care, counseling/discussion    Hardening of the aorta (main artery of the heart) 11/09/2020   Heart disease    History of radiation therapy    Left Lung- 06/20/23-06/26/23- Dr. 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   Transient ischemic attack 11/09/2020   Venous stasis of both lower extremities 06/13/2016   Significant Hospital Events: Including procedures, antibiotic start and stop dates in addition to other pertinent events   10/21 Found unresponsive at home->ED>Code stroke>intubated for ARD/airway protection>CTH/CTA negative>Code stroke cancelled>Hypotensive>started on pressors>Admit to ICU  Arterial line placed 10/21 10/22 and 10/23 unsuccessful extubation 10/24: Despite maximal Precedex and fentanyl  pushes, unable to safely and comfortably clean the patient up so added prn Versed and started fentanyl  infusion. Rectal tube added for diarrhea.  10/24: 2/3 BC resulted in staph capitis/epidermitis, extubated on BiPAP 10/25: patient remained on BiPAP for 24 hours, when she came off of BiPAP, could not tolerate became tachypneic, tachycardic went into respiratory distress requiring endotracheal reintubation 10/29: unsuccessful SBT, went briefly back into  RVR 10/30: unsuccessful weaning from sedation as BP/HR increased considerably 11/2:  extubated to HFNC 11/8: progressively became more hypoxic with decreased respiratory drive prompting ABG this a.m. which revealed toxic hypercapnic respiratory failure.  Transferred back to ICU for impending intubation; tried draining pleural spaces but still tired out 11/10: trach 11/10: aggressive diuresis with Lasix  80 followed by 160 followed by 260 + metolazone and bicarb - no response>Nephrology consulted 11/11: nephro consult. LE weakness noted on exam with inability to move - neuro consulted, MRI C and T spine ordered. CT pulled yesterday, worsening ptx>2 R CT placed, ptx improved. CRRT started. Hypotensive>briefly on Levo 11/12: Levo stopped. MRI C spine>cervical stenosis  Interim History / Subjective:  Levo stopped overnight. Rpt CXR with improvement in ptx. Patient alert, following commands, with c/o pain this morning; generalized and focused to sacral wound. Started MMPR. Recommend offloading sacrum throughout the day. Hydralazine  increased to 10mg  for BP sustaining >180 refractory to lower dose. Okay to DC R CT #2.  Family updated at bedside. Objective    Blood pressure (!) 140/58, pulse 84, temperature 98.1 F (36.7 C), temperature source Axillary, resp. rate (!) 34, height 5' 1 (1.549 m), weight 75.3 kg, SpO2 100%. CVP:  [9 mmHg-47 mmHg] 18 mmHg  Vent Mode: PRVC FiO2 (%):  [40 %-100 %] 50 % Set Rate:  [20 bmp-34 bmp] 34 bmp Vt Set:  [380 mL-400 mL] 400 mL PEEP:  [5 cmH20] 5 cmH20 Plateau Pressure:  [21 cmH20-39 cmH20] 24 cmH20   Intake/Output Summary (Last 24 hours) at 07/17/2024 0755 Last data filed at 07/17/2024 0700 Gross per 24 hour  Intake 2589.55 ml  Output 2064.6 ml  Net 524.95 ml   Filed Weights   07/14/24 0500 07/15/24 0500 07/17/24 0500  Weight: 75.4 kg 76.9 kg 75.3 kg   Examination: General: Chronically-ill adult lying in bed, NAD Neuro: GCS 11T E4V1M6; able to move BUE antigravity. Flicker movement/wiggles toes BLE.  HEENT: [place]/AT. Sclera anicteric.  Trach C/D/I. Cardiovascular: RRR, no M/R/G.  Lungs: Respirations even and unlabored. Trach. Mild rhonchi appreciated bilaterally. 2 R CT to - suction, no air leak Abdomen: soft, rounded, NT/ND.  Musculoskeletal: No gross deformities, abdominal wall and UE edema. Sacral wound covered with xeroform and mepilex border.  Resolved problem list  Acute COPD exacerbation Human metapneumovirus pneumonia Ileus Assessment and Plan  Acute on chronic hypoxic & hypercapnic respiratory failure - s/p trach 11/9 Hx COPD 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 -#1 R pigtail CT pulled 11/10; #2 R CT remained; immediate recurring ptx>R pigtail re-placed>both CT to - . No air leak.>Okay to pull CT #2 today. Monitor AM CXR. -CXR daily to evaluate CT/PTX -LAMA/LABA nebs -Daily weights -Might need CIR vs vent SNF depending on course  Concern for recurrent bacterial infection vs aspiration -  -Cultures neg thus far; completed long course of abx-stopped 11/10  Afib with RVR, new onset, paroxysmal- Chronic HFpEF - echo from 11/9 with dilated IVC and elevated RA pressures HTN RIJ CVL associated blood clot AOC anemia  CRRT-related hypotension-resolved  -Amio PO>continue to wean, Bivalrudin gtt (issues reaching therapeutic levels on Heparin)  -CT chest/abd/pelv from 11/7 without obvious source of bleeding.  -Levo started briefly for hypotension with initiation of CRRT-pressors OFF  -Hold home antihypertensives>consider restarting now that hypotension has  resolved  -SBP goal <180; PRN Hydralazine ; PRN labetalol  discontinued for hypotension (large swing)  LE weakness and concern for paralysis- -New finding on exam 11/11-able to wiggle toes on exam 11/12 AM-sensory  intact to BLE -Nsgy consulted>MRI showed cervical stenosis likely explains BLE weakness  Anuric AKI CRRT-related hypotension-resolved -Nephrology consulted 11/10 after failed lasix  challenge>started CRRT 11/11>will discuss appropriateness for HD now that here vitals are stable - continue vent support; wean as able to maintain SpO2 >90% - routine trach care - Nephrology consulted 11/10>no response to lasix  challenge>started on CRRT 11/11 - avoid nephrotoxins - abx d/c 11/10 - D/c right chest tube today 11/11 - LABA/LAMA nebs  Acute on chronic pain -Scheduled Acetaminophen  1g q8, Methocarbamol 500mg  tid, Gabapentin (home med) 100mg  tid, Oxy 5mg  PRN, Dilaudid 0.5mg  PRN -Maintain optimal MMPR  CC Time: 33 minutes  Mitchel Delduca, DNP, AGACNP-BC [facility]  Please see [email or web address] for pager details.  From 7A-7P if no response, please call [phone]. After hours, please call ELink [phone].

## 2024-07-17 NOTE — Procedures (Signed)
 Arterial Catheter Insertion Procedure Note  LOUANNA VANLIEW  [id number]  June 27, 1946  Date:07/17/24  Time:12:01 AM    Provider Performing: Lenny Drought    Procedure: Insertion of Arterial Line (63379) with US  guidance (23062)   Indication(s) Blood pressure monitoring and/or need for frequent ABGs  Consent Risks of the procedure as well as the alternatives and risks of each were explained to the patient and/or caregiver.  Consent for the procedure was obtained and is signed in the bedside chart  Anesthesia None   Time Out Verified patient identification, verified procedure, site/side was marked, verified correct patient position, special equipment/implants available, medications/allergies/relevant history reviewed, required imaging and test results available.   Sterile Technique Maximal sterile technique including full sterile barrier drape, hand hygiene, sterile gown, sterile gloves, mask, hair covering, sterile ultrasound probe cover (if used).   Procedure Description Area of catheter insertion was cleaned with chlorhexidine  and draped in sterile fashion. With real-time ultrasound guidance an arterial catheter was placed into the left radial artery.  Appropriate arterial tracings confirmed on monitor.     Complications/Tolerance None; patient tolerated the procedure well.   EBL Less than 2cc  Specimen(s) None

## 2024-07-17 NOTE — Plan of Care (Signed)
 Patient alert and interactive throughout majority of shift. Scheduled pain regimen implemented. Oxy x1, dilaudid x1 for breakthrough pain. Hypertensive at beginning of shift, much improved with administration of hydralazine  and increase of pain regimen. Afebrile. ST with ectopy (frequent PACs/PVCs). Abdomen distended but still soft. Patient denies nausea. FMS in place. Bowel regimen administered due to abdominal distention. Foley catheter in place. Minimal output. SSI administered per order. Sacral dressing changed, new photos documented in chart. Sacral offloading performed Q2 hours this shift. PICC/HD cath working without issue. CRRT running without significant events this shift.   Problem: Clinical Measurements: Goal: Ability to maintain clinical measurements within normal limits will improve Outcome: Progressing Goal: Will remain free from infection Outcome: Progressing Goal: Respiratory complications will improve Outcome: Progressing   Problem: Nutrition: Goal: Adequate nutrition will be maintained Outcome: Progressing   Problem: Skin Integrity: Goal: Risk for impaired skin integrity will decrease Outcome: Progressing

## 2024-07-17 NOTE — Progress Notes (Signed)
 eLink Physician-Brief Progress Note Patient Name: Katie Waller DOB: [birth year] MRN: [id number]   Date of Service  07/17/2024  HPI/Events of Note  Patient with frequent liquid stools in the context of areas of perineal skin breakdown.  eICU Interventions  Flexiseal ordered.        Katie Waller U [name] 07/17/2024, 12:56 AM

## 2024-07-17 NOTE — Progress Notes (Addendum)
   07/17/24 0130 07/17/24 0135  Art Line  Arterial Line BP 172/58 (PRN given) 98/41 (After labetalol , Levo back on)  Arterial Line MAP (mmHg) 89 mmHg 57 mmHg   Art line BP sustaining above goal of 160. PRN given (10mg  Labetalol ) and pressure dropped significantly. Levophed restarted at 1 mcg.   0100 ABG Results- 7.24, 69.6, 119, 30.1, BE 2

## 2024-07-17 NOTE — Progress Notes (Signed)
 PHARMACY - ANTICOAGULATION CONSULT NOTE  Pharmacy Consult:  Bivalirudin  Indication: atrial fibrillation and internal jugular thombus   Allergies  Allergen Reactions   Ace Inhibitors Swelling   Diphenhydramine Other (See Comments)    Hyperactive/jittery    Doxycycline Hyclate Nausea And Vomiting   Codeine Nausea And Vomiting    Patient Measurements: Height: 5' 1 (154.9 cm) Weight: 75.3 kg (166 lb 0.1 oz) IBW/kg (Calculated) : 47.8 HEPARIN DW (KG): 63.1  Vital Signs: Temp: 98.1 F (36.7 C) (11/12 1600) Temp Source: Axillary (11/12 1600) BP: 131/74 (11/12 1700) Pulse Rate: 101 (11/12 1700)  Labs: Recent Labs    07/15/24 2059 07/16/24 0200 07/16/24 0827 07/16/24 1623 07/16/24 1736 07/16/24 2100 07/16/24 2314 07/17/24 0105 07/17/24 0458 07/17/24 1631  HGB 9.5* 9.1*   < >  --    < >  --  9.5* 9.9* 9.1*  --   HCT 29.9* 28.6*   < >  --    < >  --  28.0* 29.0* 28.7*  --   PLT 224 209  --   --   --   --   --   --  205  --   APTT  --  100*   < >  --   --  53*  --   --  64* 71*  CREATININE 2.74* 2.89*  --  3.66*  --   --   --   --  2.03* 1.51*   < > = values in this interval not displayed.    Estimated Creatinine Clearance: 29.9 mL/min (A) (by C-G formula based on SCr of 1.51 mg/dL (H)).   Medical History: Past Medical History:  Diagnosis Date   Abnormal finding on EKG 09/19/2013   Acute on chronic respiratory failure with hypercapnia (HCC)    Acute respiratory failure (HCC) 06/08/2016   Anxiety 11/09/2020   Arthritis    Asthma    Carotid bruit 11/09/2020   Chronic diastolic CHF (congestive heart failure) (HCC) 07/28/2017   Chronic obstructive pulmonary disease, unspecified (HCC) 11/09/2020   COPD, group D, by GOLD 2017 classification (HCC) 09/17/2013   Cough 11/09/2020   Decreased estrogen level 11/09/2020   Diabetes (HCC)    Dyspnea    Edema 11/09/2020   Essential hypertension 09/18/2013   Goals of care, counseling/discussion    Hardening of the aorta  (main artery of the heart) 11/09/2020   Heart disease    History of radiation therapy    Left Lung- 06/20/23-06/26/23- Dr. 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  She was previously on IV heparin and was switched to bivalirudin due to labile heparin levels.  Med was stopped because it was thought that Afib was secondary to critical illness, which resolved. However patient went back into Afib w/ RVR on 11/7.   HgB stable this AM after transfusion of two units on 11/9. HgB 9.7 and PLTs  2344. No s/sx of bleeding. Discussed with CCM will resume bival.   aPTT 71, therapeutic  No issues with infusion or bleeding per RN.   Goal of Therapy:  aPTT 50-85 seconds Monitor platelets by anticoagulation protocol: Yes   Plan:  Continue bivalrudin to 0.1 mg/kg/hr.  Using weight of 66 kg which is what weight had been used previously while patient was therapeutic on bival.  aPTT BID.  Daily CBC  Thank you for allowing pharmacy to participate in this patient's care.  Leonor GORMAN Bash, PharmD Emergency Medicine Clinical Pharmacist 07/17/2024,5:42 PM

## 2024-07-17 NOTE — Progress Notes (Signed)
 eLink Physician-Brief Progress Note Patient Name: Katie Waller DOB: 1946-05-15 MRN: [id number]   Date of Service  07/17/2024  HPI/Events of Note  ABG results reviewed.  eICU Interventions  Ventilator settings adjusted, ABG in one hour.        Cheyene Hamric U Manan Olmo 07/17/2024, 12:05 AM

## 2024-07-18 ENCOUNTER — Inpatient Hospital Stay (HOSPITAL_COMMUNITY)

## 2024-07-18 DIAGNOSIS — B9781 Human metapneumovirus as the cause of diseases classified elsewhere: Secondary | ICD-10-CM | POA: Diagnosis not present

## 2024-07-18 DIAGNOSIS — J449 Chronic obstructive pulmonary disease, unspecified: Secondary | ICD-10-CM | POA: Diagnosis not present

## 2024-07-18 DIAGNOSIS — J9620 Acute and chronic respiratory failure, unspecified whether with hypoxia or hypercapnia: Secondary | ICD-10-CM

## 2024-07-18 DIAGNOSIS — G929 Unspecified toxic encephalopathy: Secondary | ICD-10-CM | POA: Diagnosis not present

## 2024-07-18 DIAGNOSIS — R1110 Vomiting, unspecified: Secondary | ICD-10-CM

## 2024-07-18 DIAGNOSIS — I4891 Unspecified atrial fibrillation: Secondary | ICD-10-CM

## 2024-07-18 LAB — COMPREHENSIVE METABOLIC PANEL WITH GFR
ALT: 39 U/L (ref 0–44)
AST: 47 U/L — ABNORMAL HIGH (ref 15–41)
Albumin: 2.5 g/dL — ABNORMAL LOW (ref 3.5–5.0)
Alkaline Phosphatase: 52 U/L (ref 38–126)
Anion gap: 11 (ref 5–15)
BUN: 47 mg/dL — ABNORMAL HIGH (ref 8–23)
CO2: 25 mmol/L (ref 22–32)
Calcium: 8.7 mg/dL — ABNORMAL LOW (ref 8.9–10.3)
Chloride: 99 mmol/L (ref 98–111)
Creatinine, Ser: 1.6 mg/dL — ABNORMAL HIGH (ref 0.44–1.00)
GFR, Estimated: 33 mL/min — ABNORMAL LOW (ref >60)
Glucose, Bld: 182 mg/dL — ABNORMAL HIGH (ref 70–99)
Potassium: 4.8 mmol/L (ref 3.5–5.1)
Sodium: 135 mmol/L (ref 135–145)
Total Bilirubin: 0.7 mg/dL (ref 0.0–1.2)
Total Protein: 6 g/dL — ABNORMAL LOW (ref 6.5–8.1)

## 2024-07-18 LAB — RENAL FUNCTION PANEL
Albumin: 2.4 g/dL — ABNORMAL LOW (ref 3.5–5.0)
Albumin: 2.4 g/dL — ABNORMAL LOW (ref 3.5–5.0)
Anion gap: 11 (ref 5–15)
Anion gap: 10 (ref 5–15)
BUN: 46 mg/dL — ABNORMAL HIGH (ref 8–23)
BUN: 38 mg/dL — ABNORMAL HIGH (ref 8–23)
CO2: 26 mmol/L (ref 22–32)
CO2: 26 mmol/L (ref 22–32)
Calcium: 8.8 mg/dL — ABNORMAL LOW (ref 8.9–10.3)
Calcium: 8.6 mg/dL — ABNORMAL LOW (ref 8.9–10.3)
Chloride: 99 mmol/L (ref 98–111)
Chloride: 99 mmol/L (ref 98–111)
Creatinine, Ser: 1.62 mg/dL — ABNORMAL HIGH (ref 0.44–1.00)
Creatinine, Ser: 1.17 mg/dL — ABNORMAL HIGH (ref 0.44–1.00)
GFR, Estimated: 33 mL/min — ABNORMAL LOW (ref >60)
GFR, Estimated: 49 mL/min — ABNORMAL LOW (ref >60)
Glucose, Bld: 185 mg/dL — ABNORMAL HIGH (ref 70–99)
Glucose, Bld: 136 mg/dL — ABNORMAL HIGH (ref 70–99)
Phosphorus: 3.9 mg/dL (ref 2.5–4.6)
Phosphorus: 3.1 mg/dL (ref 2.5–4.6)
Potassium: 4.8 mmol/L (ref 3.5–5.1)
Potassium: 4.9 mmol/L (ref 3.5–5.1)
Sodium: 136 mmol/L (ref 135–145)
Sodium: 135 mmol/L (ref 135–145)

## 2024-07-18 LAB — CBC
HCT: 31.3 % — ABNORMAL LOW (ref 36.0–46.0)
Hemoglobin: 9.8 g/dL — ABNORMAL LOW (ref 12.0–15.0)
MCH: 30.2 pg (ref 26.0–34.0)
MCHC: 31.3 g/dL (ref 30.0–36.0)
MCV: 96.3 fL (ref 80.0–100.0)
Platelets: 228 K/uL (ref 150–400)
RBC: 3.25 MIL/uL — ABNORMAL LOW (ref 3.87–5.11)
RDW: 15.9 % — ABNORMAL HIGH (ref 11.5–15.5)
WBC: 8.6 K/uL (ref 4.0–10.5)
nRBC: 6.9 % — ABNORMAL HIGH (ref 0.0–0.2)

## 2024-07-18 LAB — GLUCOSE, CAPILLARY
Glucose-Capillary: 134 mg/dL — ABNORMAL HIGH (ref 70–99)
Glucose-Capillary: 141 mg/dL — ABNORMAL HIGH (ref 70–99)
Glucose-Capillary: 145 mg/dL — ABNORMAL HIGH (ref 70–99)
Glucose-Capillary: 183 mg/dL — ABNORMAL HIGH (ref 70–99)
Glucose-Capillary: 229 mg/dL — ABNORMAL HIGH (ref 70–99)
Glucose-Capillary: 64 mg/dL — ABNORMAL LOW (ref 70–99)
Glucose-Capillary: 82 mg/dL (ref 70–99)
Glucose-Capillary: 99 mg/dL (ref 70–99)

## 2024-07-18 LAB — POCT I-STAT 7, (LYTES, BLD GAS, ICA,H+H)
Acid-Base Excess: 5 mmol/L — ABNORMAL HIGH (ref 0.0–2.0)
Acid-base deficit: 6 mmol/L — ABNORMAL HIGH (ref 0.0–2.0)
Acid-base deficit: 3 mmol/L — ABNORMAL HIGH (ref 0.0–2.0)
Bicarbonate: 23.9 mmol/L (ref 20.0–28.0)
Bicarbonate: 25.8 mmol/L (ref 20.0–28.0)
Bicarbonate: 32 mmol/L — ABNORMAL HIGH (ref 20.0–28.0)
Calcium, Ion: 1.21 mmol/L (ref 1.15–1.40)
Calcium, Ion: 1.24 mmol/L (ref 1.15–1.40)
Calcium, Ion: 1.2 mmol/L (ref 1.15–1.40)
HCT: 43 % (ref 36.0–46.0)
HCT: 29 % — ABNORMAL LOW (ref 36.0–46.0)
HCT: 29 % — ABNORMAL LOW (ref 36.0–46.0)
Hemoglobin: 14.6 g/dL (ref 12.0–15.0)
Hemoglobin: 9.9 g/dL — ABNORMAL LOW (ref 12.0–15.0)
Hemoglobin: 9.9 g/dL — ABNORMAL LOW (ref 12.0–15.0)
O2 Saturation: 96 %
O2 Saturation: 96 %
O2 Saturation: 98 %
Patient temperature: 98
Potassium: 5.7 mmol/L — ABNORMAL HIGH (ref 3.5–5.1)
Potassium: 4.8 mmol/L (ref 3.5–5.1)
Potassium: 5 mmol/L (ref 3.5–5.1)
Sodium: 134 mmol/L — ABNORMAL LOW (ref 135–145)
Sodium: 135 mmol/L (ref 135–145)
Sodium: 133 mmol/L — ABNORMAL LOW (ref 135–145)
TCO2: 26 mmol/L (ref 22–32)
TCO2: 28 mmol/L (ref 22–32)
TCO2: 34 mmol/L — ABNORMAL HIGH (ref 22–32)
pCO2 arterial: 66.5 mmHg (ref 32–48)
pCO2 arterial: 63.7 mmHg — ABNORMAL HIGH (ref 32–48)
pCO2 arterial: 56.2 mmHg — ABNORMAL HIGH (ref 32–48)
pH, Arterial: 7.163 — CL (ref 7.35–7.45)
pH, Arterial: 7.216 — ABNORMAL LOW (ref 7.35–7.45)
pH, Arterial: 7.362 (ref 7.35–7.45)
pO2, Arterial: 111 mmHg — ABNORMAL HIGH (ref 83–108)
pO2, Arterial: 100 mmHg (ref 83–108)
pO2, Arterial: 107 mmHg (ref 83–108)

## 2024-07-18 LAB — APTT
aPTT: 76 s — ABNORMAL HIGH (ref 24–36)
aPTT: 69 s — ABNORMAL HIGH (ref 24–36)

## 2024-07-18 LAB — MAGNESIUM: Magnesium: 2.8 mg/dL — ABNORMAL HIGH (ref 1.7–2.4)

## 2024-07-18 MED ORDER — BUDESONIDE 0.25 MG/2ML IN SUSP: 0.2500 mg | Freq: Two times a day (BID) | RESPIRATORY_TRACT | Status: DC

## 2024-07-18 MED ORDER — FENTANYL CITRATE (PF) 50 MCG/ML IJ SOSY
12.5000 ug | PREFILLED_SYRINGE | INTRAMUSCULAR | Status: DC | PRN
Start: 2024-07-18 — End: 2024-07-19
  Administered 2024-07-18 (×3): 25 ug via INTRAVENOUS
  Administered 2024-07-18: 12.5 ug via INTRAVENOUS
  Administered 2024-07-19 (×6): 25 ug via INTRAVENOUS
  Filled 2024-07-18 (×11): qty 1

## 2024-07-18 MED ORDER — BUDESONIDE 0.5 MG/2ML IN SUSP
0.5000 mg | Freq: Two times a day (BID) | RESPIRATORY_TRACT | Status: DC
  Administered 2024-07-18 – 2024-07-22 (×9): 0.5 mg via RESPIRATORY_TRACT
  Filled 2024-07-18 (×9): qty 2

## 2024-07-18 MED ORDER — STERILE WATER FOR INJECTION IV SOLN
INTRAVENOUS | Status: AC
  Filled 2024-07-18: qty 1000
  Filled 2024-07-18 (×4): qty 150
  Filled 2024-07-18 (×2): qty 1000
  Filled 2024-07-18: qty 150

## 2024-07-18 MED ORDER — AMIODARONE IV BOLUS ONLY 150 MG/100ML
150.0000 mg | Freq: Once | INTRAVENOUS | Status: AC
  Administered 2024-07-18: 150 mg via INTRAVENOUS
  Filled 2024-07-18: qty 100

## 2024-07-18 MED ORDER — DEXTROSE 50 % IV SOLN
INTRAVENOUS | Status: AC
  Administered 2024-07-18: 25 mL
  Filled 2024-07-18: qty 50

## 2024-07-18 MED ORDER — IPRATROPIUM-ALBUTEROL 0.5-2.5 (3) MG/3ML IN SOLN
3.0000 mL | Freq: Four times a day (QID) | RESPIRATORY_TRACT | Status: DC
  Administered 2024-07-18 – 2024-07-21 (×13): 3 mL via RESPIRATORY_TRACT
  Filled 2024-07-18 (×13): qty 3

## 2024-07-18 NOTE — Progress Notes (Signed)
 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 Code stroke called. On arrival to ED patient emergently intubated for acute respiratory distress and airway protection. CT Head with no ICH or large infarct, CTA head and neck with no LVO. CXR concerning for a Pneumonia with visible BL lung apices on CTA demonstrating infection/inflammation. Code stroke cancelled with presentation suspicious for sepsis. Patient received 30ml/kg crystalloid, pan cultured, and started empirically on Cefepime/Vanc/Azithromycin . Of note, family reports patient has recently not felt well and likely has had a cold. PCCM consulted for ICU admission.   On exam in ED, patient febrile to >101 and hypotensive, likely multifactorial in setting of presumed sepsis and recent sedation on induction with underlying heart disease. Norepinephrine gtt was initiated for MAPs < 65 and patient was transferred to ICU with plans to gain central access for vasopressor administration and arterial line placement for hemodynamic monitoring.  Pertinent  Medical History  Per above  Significant Hospital Events: Including procedures, antibiotic start and stop dates in addition to other pertinent events   10/21 Found unresponsive at home->ED>Code stroke>intubated for ARD/airway protection>CTH/CTA negative>Code stroke cancelled>Hypotensive>started on pressors>Admit to ICU  Arterial line placed 10/21 10/22 and 10/23 unsuccessful extubation 10/24: Despite maximal Precedex and fentanyl   pushes, unable to safely and comfortably clean the patient up so added prn Versed and started fentanyl  infusion. Rectal tube added for diarrhea.  10/24: 2/3 BC resulted in staph capitis/epidermitis, extubated on BiPAP 10/25: patient remained on BiPAP for 24 hours, when she came off of BiPAP, could not tolerate became tachypneic, tachycardic went into respiratory distress requiring endotracheal reintubation 10/29: unsuccessful SBT, went briefly back into RVR 10/30: unsuccessful weaning from sedation as BP/HR increased considerably 11/2: extubated to HFNC 11/8: progressively became more hypoxic with decreased respiratory drive prompting ABG this a.m. which revealed toxic hypercapnic respiratory failure.  Transferred back to ICU for impending intubation; tried draining pleural spaces but still tired out 11/10: trach 11/10: aggressive diuresis with Lasix  80 followed by 160 followed by 260 + metolazone and bicarb - no response>Nephrology consulted 11/11: nephro consult. LE weakness noted on exam with inability to move - neuro consulted, MRI C and T spine ordered. CT pulled yesterday, worsening ptx>2 R CT placed, ptx improved. CRRT started. Hypotensive>briefly on Levo 11/12: Levo stopped. MRI C spine>cervical stenosis  Interim History / Subjective:  Had some potentially bilious emesis. Abdominal distention.   Objective    Blood pressure 132/61, pulse 78, temperature 99.1 F (37.3 C), temperature source Oral, resp. rate (!) 30, height 5' 1 (1.549 m), weight 75.3 kg, SpO2 100%. CVP:  [11 mmHg-21 mmHg] 14 mmHg  Vent Mode: PRVC FiO2 (%):  [40 %] 40 % Set Rate:  [34 bmp] 34 bmp Vt Set:  [400 mL] 400 mL PEEP:  [5 cmH20] 5 cmH20 Plateau Pressure:  [24 cmH20-30 cmH20] 28 cmH20   Intake/Output Summary (Last 24 hours) at 07/18/2024 0906 Last data filed at 07/18/2024 0800 Gross per 24 hour  Intake 2449.94 ml  Output 4217.1 ml  Net -1767.16 ml   Katie Waller  Weights   07/15/24 0500 07/17/24 0500 07/18/24  0230  Weight: 76.9 kg 75.3 kg 75.3 kg    Examination: General: Chronically ill-appearing, lying in bed, NAD HENT: trach in place, no bleeding Cardiovascular: irregular rate and rhythm, no mrg Lungs: rhonchi bilaterally, no wheezing (anterior auscultation) Abdomen: distended, firm, no guarding Extremities: warm and dry, mild edema Neuro:   Resolved problem list   Assessment and Plan  Acute on chronic hypoxic & hypercapnic respiratory failure - s/p trach 11/9 Hx COPD 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 -#1 R pigtail CT pulled 11/10; #2 R CT remained; immediate recurring ptx>R pigtail re-placed>both CT to - . No air leak.>CT pulled 11/12. Repeat CXR today. -LAMA/LABA nebs -Daily weights -Might need CIR vs vent SNF depending on course  Afib with RVR, new onset, paroxysmal- Chronic HFpEF - limited Echo from 11/9 with dilated IVC and elevated RA pressures  HTN RIJ CVL associated blood clot AOC anemia  CRRT-related hypotension-resolved  -Amio PO>continue to wean as able but currently RVR > Amio bolus X 1, determine need for gtt - Bivalrudin gtt (issues reaching therapeutic levels on Heparin)  -CT chest/abd/pelv from 11/7 without obvious source of bleeding.  -Levo started briefly for hypotension with initiation of CRRT-pressors OFF this AM  -Hold home antihypertensives>consider restarting now that hypotension has resolved  -SBP goal <180; PRN Hydralazine ; PRN labetalol  discontinued for hypotension (large swing)   Concern for recurrent bacterial infection vs aspiration 11/09 BAL NGTD   LE weakness and concern for paralysis- -New finding on exam 11/11-able to wiggle toes on exam 11/12 AM-sensory intact to BLE -Nsgy consulted>MRI showed  cervical stenosis likely explains BLE weakness   Anuric AKI Likely 2/2 sepsis/pneumonia and the use of antibiotics  CRRT-related hypotension-resolved -Nephrology consulted 11/10 after failed lasix  challenge>started CRRT 11/11>will discuss appropriateness for HD now that here vitals are stable - continue vent support; wean as able to maintain SpO2 >90% - routine trach care - Nephrology consulted 11/10>no response to lasix  challenge>started on CRRT 11/11 - avoid nephrotoxins - abx d/c 11/10 - D/c right chest tube today 11/11 - LABA/LAMA nebs   Acute on chronic pain -Has been getting scheduled Acetaminophen  1g q8, Methocarbamol 500mg  tid, Gabapentin (home med) 100mg  tid, Oxy 5mg  PRN, Dilaudid 0.5mg  PRN > She is overly sedated/hypotensive. Will dc Robaxin, Oxy, Dilaudid and start Fentanyl  prn.  Labs   CBC: Recent Labs  Lab 07/15/24 1555 07/15/24 2059 07/16/24 0200 07/16/24 1558 07/16/24 1736 07/16/24 2314 07/17/24 0105 07/17/24 0458 07/18/24 0312  WBC 12.3* 11.9* 11.0*  --   --   --   --  8.8 8.6  HGB 9.8* 9.5* 9.1*   < > 9.9* 9.5* 9.9* 9.1* 9.8*  HCT 31.5* 29.9* 28.6*   < > 29.0* 28.0* 29.0* 28.7* 31.3*  MCV 98.4 97.4 96.0  --   --   --   --  96.3 96.3  PLT 222 224 209  --   --   --   --  205 228   < > = values in this interval not displayed.    Basic Metabolic Panel: Recent Labs  Lab 07/14/24 0529 07/15/24 0419 07/15/24 2059 07/16/24 0200 07/16/24 1558 07/16/24 1623 07/16/24 1736 07/16/24 2314 07/17/24 0105 07/17/24 0458 07/17/24 1631 07/18/24 0312  NA 139 139 137  138   < > 140   < > 132* 133* 139 134* 136  135  K 3.8 3.9 4.6 4.4   < > 5.3*   < > 5.5* 5.3* 5.4* 4.7 4.8  4.8  CL 93* 92* 89* 88*  --  91*  --   --   --  98 95* 99  99  CO2 32 33* 31 31  --  29  --   --   --  27 26 26  25   GLUCOSE 201* 136* 181* 160*  --  141*  --   --   --  183* 170* 185*  182*  BUN 39* 52* 69* 72*  --  92*  --   --   --  54* 38* 46*  47*  CREATININE 1.44* 2.10* 2.74* 2.89*   --  3.66*  --   --   --  2.03* 1.51* 1.62*  1.60*  CALCIUM  9.5 8.9 8.5* 8.5*  --  8.4*  --   --   --  8.0* 8.5* 8.8*  8.7*  MG 2.4 3.8* 3.4*  --   --   --   --   --   --  3.2*  --  2.8*  PHOS 2.0* 6.5*  --   --   --  8.3*  --   --   --  3.4 2.0* 3.9   < > = values in this interval not displayed.   GFR: Estimated Creatinine Clearance: 28.2 mL/min (A) (by C-G formula based on SCr of 1.6 mg/dL (H)). Recent Labs  Lab 07/11/24 1019 07/12/24 0841 07/13/24 1440 07/14/24 0529 07/15/24 2059 07/16/24 0200 07/17/24 0458 07/18/24 0312  PROCALCITON 0.28  --   --   --   --   --   --   --   WBC  --    < >  --    < > 11.9* 11.0* 8.8 8.6  LATICACIDVEN 0.7  --  0.8  --   --   --   --   --    < > = values in this interval not displayed.    Liver Function Tests: Recent Labs  Lab 07/14/24 0529 07/15/24 0419 07/16/24 0200 07/16/24 1623 07/17/24 0458 07/17/24 1631 07/18/24 0312  AST 30 47* 55*  --  52*  --  47*  ALT 21 22 29   --  35  --  39  ALKPHOS 37* 38 43  --  47  --  52  BILITOT 0.5 0.7 0.4  --  0.6  --  0.7  PROT 5.6* 5.3* 5.3*  --  5.5*  --  6.0*  ALBUMIN 3.0* 2.9* 2.5* 2.5* 2.4* 2.4* 2.4*  2.5*   Recent Labs  Lab 07/13/24 0800  LIPASE 99*   No results for input(s): AMMONIA in the last 168 hours.  ABG    Component Value Date/Time   PHART 7.244 (L) 07/17/2024 0105   PCO2ART 69.6 (HH) 07/17/2024 0105   PO2ART 119 (H) 07/17/2024 0105   HCO3 30.1 (H) 07/17/2024 0105   TCO2 32 07/17/2024 0105   O2SAT 98 07/17/2024 0105     Coagulation Profile: No results for input(s): INR, PROTIME in the last 168 hours.  Cardiac Enzymes: No results for input(s): CKTOTAL, CKMB, CKMBINDEX, TROPONINI in the last 168 hours.  HbA1C: Hgb A1c MFr Bld  Date/Time Value Ref Range Status  06/26/2024 04:47 AM 5.6 4.8 - 5.6 % Final    Comment:    (NOTE) Diagnosis  of Diabetes The following HbA1c ranges recommended by the American Diabetes Association (ADA) may be used as an aid  in the diagnosis of diabetes mellitus.  Hemoglobin             Suggested A1C NGSP%              Diagnosis  <5.7                   Non Diabetic  5.7-6.4                Pre-Diabetic  >6.4                   Diabetic  <7.0                   Glycemic control for                       adults with diabetes.    09/17/2013 01:57 PM 6.1 (H) <5.7 % Final    Comment:    (NOTE)                                                                       According to the ADA Clinical Practice Recommendations for 2011, when HbA1c is used as a screening test:  >=6.5%   Diagnostic of Diabetes Mellitus           (if abnormal result is confirmed) 5.7-6.4%   Increased risk of developing Diabetes Mellitus References:Diagnosis and Classification of Diabetes Mellitus,Diabetes Care,2011,34(Suppl 1):S62-S69 and Standards of Medical Care in         Diabetes - 2011,Diabetes Care,2011,34 (Suppl 1):S11-S61.    CBG: Recent Labs  Lab 07/17/24 1940 07/17/24 2347 07/18/24 0217 07/18/24 0313 07/18/24 0734  GLUCAP 117* 149* 145* 183* 134*    Review of Systems:     Past Medical History:  She,  has a past medical history of Abnormal finding on EKG (09/19/2013), Acute on chronic respiratory failure with hypercapnia (HCC), Acute respiratory failure (HCC) (06/08/2016), Anxiety (11/09/2020), Arthritis, Asthma, Carotid bruit (11/09/2020), Chronic diastolic CHF (congestive heart failure) (HCC) (07/28/2017), Chronic obstructive pulmonary disease, unspecified (HCC) (11/09/2020), COPD, group D, by GOLD 2017 classification (HCC) (09/17/2013), Cough (11/09/2020), Decreased estrogen level (11/09/2020), Diabetes (HCC), Dyspnea, Edema (11/09/2020), Essential hypertension (09/18/2013), Goals of care, counseling/discussion, Hardening of the aorta (main artery of the heart) (11/09/2020), Heart disease, History of radiation therapy, Hypertension, Hypertensive heart failure (HCC) (11/09/2020), Hypoxia (11/09/2020), Insomnia  (11/09/2020), Iron deficiency anemia (11/09/2020), Large liver (11/09/2020), Near syncope (09/17/2013), Osteoporosis (11/09/2020), Palliative care encounter, Prediabetes (09/19/2013), Pure hypercholesterolemia (11/09/2020), Raynaud's disease (11/09/2020), Sinus tachycardia (09/18/2013), Skin sensation disturbance (11/09/2020), Smoker (09/17/2013), 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   Surgical History:   Past Surgical History:  Procedure Laterality Date   BRONCHIAL BIOPSY  05/25/2023   Procedure: BRONCHIAL BIOPSIES;  Surgeon: Brenna Adine CROME, DO;  Location: [facility];  Service: Pulmonary;;   BRONCHIAL NEEDLE ASPIRATION BIOPSY  05/25/2023   Procedure: BRONCHIAL NEEDLE ASPIRATION BIOPSIES;  Surgeon: Brenna Adine CROME, DO;  Location: [facility];  Service: Pulmonary;;   FIDUCIAL MARKER PLACEMENT  05/25/2023   Procedure: FIDUCIAL MARKER PLACEMENT;  Surgeon: Brenna Adine CROME, DO;  Location: [facility];  Service: Pulmonary;;   KNEE SURGERY Left      Social History:   reports that she quit smoking about 6 years ago. Her smoking use included cigarettes. She started smoking about 6 years ago. She has a 55 pack-year smoking history. She has never used smokeless tobacco. She reports that she does not drink alcohol and does not use drugs.   Family History:  Her family history includes Hyperlipidemia in her father and mother; Hypertension in her father and mother.   Allergies Allergies  Allergen Reactions   Ace Inhibitors Swelling   Diphenhydramine Other (See Comments)    Hyperactive/jittery    Doxycycline Hyclate Nausea And Vomiting   Codeine Nausea And Vomiting     Home Medications  Prior to Admission medications   Medication Sig Start Date End Date Taking? Authorizing Provider  acetaminophen  (TYLENOL ) 500 MG tablet Take 1,000 mg by mouth every 6 (six) hours as needed for moderate  pain.   Yes [provider]  albuterol  (PROVENTIL  HFA;VENTOLIN  HFA) 108 (90 BASE) MCG/ACT inhaler Inhale 2 puffs into the lungs every 6 (six) hours as needed for wheezing or shortness of breath. 09/19/13  Yes Johnson, Clanford L, MD  aspirin  81 MG chewable tablet Chew 1 tablet (81 mg total) by mouth daily. 09/19/13  Yes Johnson, Clanford L, MD  atorvastatin  (LIPITOR) 10 MG tablet TAKE 1 TABLET BY MOUTH EVERY DAY 07/21/23  Yes Krasowski, Robert J, MD  benzonatate  (TESSALON ) 100 MG capsule Take 100 mg by mouth 3 (three) times daily as needed for cough.    Yes [provider]  Biotin 10 MG CAPS Take 10 mg by mouth daily.   Yes [provider]  budesonide -formoterol  (SYMBICORT ) 160-4.5 MCG/ACT inhaler Inhale 2 puffs into the lungs 2 (two) times daily. 08/23/19  Yes Mannam, Praveen, MD  calcium  carbonate (OSCAL) 1500 (600 Ca) MG TABS tablet Take 600 mg of elemental calcium  by mouth daily.   Yes [provider]  Cholecalciferol (VITAMIN D ) 50 MCG (2000 UT) tablet Take 2,000 Units by mouth daily.   Yes [provider]  diphenhydramine-acetaminophen  (TYLENOL  PM) 25-500 MG TABS tablet Take 1 tablet by mouth at bedtime.   Yes [provider]  furosemide  (LASIX ) 40 MG tablet Take 40 mg by mouth daily.   Yes [provider]  gabapentin (NEURONTIN) 300 MG capsule Take 900 mg by mouth at bedtime.   Yes [provider]  iron polysaccharides (NIFEREX) 150 MG capsule Take 150 mg by mouth daily. 11/05/20  Yes [provider]  loratadine (CLARITIN) 10 MG tablet Take 10 mg by mouth daily as needed for allergies.   Yes [provider]  LORazepam  (ATIVAN ) 1 MG tablet Take 1 tablet (1 mg total) by mouth every 6 (six) hours as needed for anxiety. 08/07/17  Yes Drusilla Sabas RAMAN, MD  losartan  (COZAAR ) 50 MG tablet Take 50 mg by mouth daily.   Yes [provider]  Multiple Vitamin (MULTIVITAMIN WITH MINERALS) TABS tablet Take 1 tablet by  mouth daily.   Yes [provider]  raloxifene  (EVISTA ) 60 MG tablet Take 60 mg by mouth every morning. 08/07/20  Yes [provider]  Tiotropium Bromide  Monohydrate (SPIRIVA  RESPIMAT) 2.5 MCG/ACT AERS Inhale 1 puff into the lungs daily.   Yes [provider]  traZODone  (DESYREL ) 100 MG tablet Take 1 tablet (100 mg total) by mouth at bedtime as needed for sleep. 08/07/17  Yes Drusilla Sabas  GORMAN, MD     Norman Lobstein, DO

## 2024-07-18 NOTE — Progress Notes (Signed)
 [facility] NEPHROLOGY PROGRESS NOTE  Assessment/ Plan: Pt is a 78 y.o. 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   # Anuric acute kidney injury with evidence of fluid overload likely due to sepsis/pneumonia and the use of antibiotics. She did not respond with high-dose IV diuretics.  UA and kidney ultrasound unremarkable. -Started CRRT on 11/11 after placement of left IJ temporary HD catheter. -She remains anuric without evidence of renal recovery therefore we will continue CRRT today.  UF around 100 cc an hour net negative.  Call - Continue with strict ins and out and lab monitoring.  Adjust CRRT prescription based on the lab results and clinical response.   # Acute on chronic hypoxic respiratory failure, metapneumovirus pneumonia: Initially treated with broad-spectrum antibiotics which was discontinued later.  Patient required tracheostomy on 11/9 and managing by pulmonary team.   # Bilateral pleural effusion, fluid overload: Patient required chest tube and has been managing by pulmonary team.  # Anemia: Hemoglobin is around 9-9.8.  Monitor and transfuse as needed.  Subjective: Seen and examined in ICU.  No major urine output.  CRRT running well without any issues.  The daughter at the bedside.  Discussed with the ICU nurse. Objective Vital signs in last 24 hours: Vitals:   07/18/24 0745 07/18/24 0750 07/18/24 0800 07/18/24 0815  BP:      Pulse: 91  68 78  Resp: (!) 30  (!) 34 (!) 30  Temp:  99.1 F (37.3 C)    TempSrc:  Oral    SpO2: 100%  100% 100%  Weight:      Height:       Weight change: 0 kg  Intake/Output Summary (Last 24 hours) at 07/18/2024  0851 Last data filed at 07/18/2024 0800 Gross per 24 hour  Intake 2508.14 ml  Output 4412.9 ml  Net -1904.76 ml       Labs: RENAL PANEL Recent Labs  Lab 07/14/24 0529 07/15/24 0419 07/15/24 2059 07/16/24 0200 07/16/24 1558 07/16/24 1623 07/16/24 1736 07/16/24 2314 07/17/24 0105 07/17/24 0458 07/17/24 1631 07/18/24 0312  NA 139 139 137 138   < > 140   < > 132* 133* 139 134* 136  135  K 3.8 3.9 4.6 4.4   < > 5.3*   < > 5.5* 5.3* 5.4* 4.7 4.8  4.8  CL 93* 92* 89* 88*  --  91*  --   --   --  98 95* 99  99  CO2 32 33* 31 31  --  29  --   --   --  27 26 26  25   GLUCOSE 201* 136* 181* 160*  --  141*  --   --   --  183* 170* 185*  182*  BUN 39* 52* 69* 72*  --  92*  --   --   --  54* 38* 46*  47*  CREATININE 1.44* 2.10* 2.74* 2.89*  --  3.66*  --   --   --  2.03* 1.51* 1.62*  1.60*  CALCIUM  9.5 8.9 8.5* 8.5*  --  8.4*  --   --   --  8.0* 8.5* 8.8*  8.7*  MG 2.4 3.8* 3.4*  --   --   --   --   --   --  3.2*  --  2.8*  PHOS 2.0* 6.5*  --   --   --  8.3*  --   --   --  3.4 2.0* 3.9  ALBUMIN 3.0* 2.9*  --  2.5*  --  2.5*  --   --   --  2.4* 2.4* 2.4*  2.5*   < > = values in this interval not displayed.    Liver Function Tests: Recent Labs  Lab 07/16/24 0200 07/16/24 1623 07/17/24 0458 07/17/24 1631 07/18/24 0312  AST 55*  --  52*  --  47*  ALT 29  --  35  --  39  ALKPHOS 43  --  47  --  52  BILITOT 0.4  --  0.6  --  0.7  PROT 5.3*  --  5.5*  --  6.0*  ALBUMIN 2.5*   < > 2.4* 2.4* 2.4*  2.5*   < > = values in this interval not displayed.   Recent Labs  Lab 07/13/24 0800  LIPASE 99*   No results for input(s): AMMONIA in the last 168 hours. CBC: Recent Labs    07/11/24 0228 07/12/24 0841 07/16/24 1736 07/16/24 2314 07/17/24 0105 07/17/24 0458 07/18/24 0312  HGB 8.9*   < > 9.9* 9.5* 9.9* 9.1* 9.8*  MCV 100.0   < >  --   --   --  96.3 96.3  VITAMINB12 1,029*  --   --   --   --   --   --   FOLATE >20.0  --   --   --   --   --   --   FERRITIN 762*   --   --   --   --   --   --   TIBC 227*  --   --   --   --   --   --   IRON 44  --   --   --   --   --   --   RETICCTPCT 1.7  --   --   --   --   --   --    < > = values in this interval not displayed.    Cardiac Enzymes: No results for input(s): CKTOTAL, CKMB, CKMBINDEX, TROPONINI in the last 168 hours. CBG: Recent Labs  Lab 07/17/24 1940 07/17/24 2347 07/18/24 0217 07/18/24 0313 07/18/24 0734  GLUCAP 117* 149* 145* 183* 134*    Iron Studies: No results for input(s): IRON, TIBC, TRANSFERRIN, FERRITIN in the last 72 hours. Studies/Results:   Medications: Infusions:  bivalirudin (ANGIOMAX) 250 mg in sodium chloride  0.9 % 500 mL (0.5 mg/mL) infusion 0.1 mg/kg/hr (07/18/24 0800)   feeding supplement (VITAL 1.5 CAL) 45 mL/hr at 07/18/24 0800   norepinephrine (LEVOPHED) Adult infusion 2 mcg/min (07/18/24 0800)   PrismaSol BGK 2/3.5 400 mL/hr at 07/18/24 0320   PrismaSol BGK 2/3.5 400 mL/hr at 07/18/24 0320   prismasol BGK 4/2.5 1,500 mL/hr at 07/18/24 0636    Scheduled Medications:  sodium chloride    Intravenous Once   acetaminophen   1,000 mg Per Tube Q8H   amiodarone  200 mg Per Tube Daily   arformoterol   15 mcg Nebulization BID   B-complex with vitamin C  1 tablet Per Tube Daily   Chlorhexidine  Gluconate Cloth  6 each Topical Daily   feeding supplement (PROSource TF20)  60 mL Per Tube TID   fiber supplement (BANATROL TF)  60 mL Per Tube BID   gabapentin  100  mg Per Tube Q8H   insulin aspart  0-9 Units Subcutaneous Q4H   lidocaine -EPINEPHrine  20 mL Intradermal Once   methocarbamol  500 mg Per Tube TID   nutrition supplement (JUVEN)  1 packet Per Tube BID BM   mouth rinse  15 mL Mouth Rinse Q2H   pantoprazole (PROTONIX) IV  40 mg Intravenous Q24H   polyethylene glycol  17 g Per Tube Daily   revefenacin  175 mcg Nebulization Daily   sodium chloride  flush  10 mL Intrapleural Q8H   sodium chloride  flush  10-40 mL Intracatheter Q12H    have reviewed  scheduled and prn medications.  Physical Exam: General: Ill looking female, trach on vent,  Heart:RRR, s1s2 nl Lungs: Coarse breath sound bilateral. Abdomen:soft, Non-tender, non-distended Extremities: Trace peripheral dependent edema present Dialysis Access: Left temporary HD catheter  Amato Sevillano Prasad Kemyra August 07/18/2024,8:51 AM  LOS: 23 days

## 2024-07-18 NOTE — Progress Notes (Signed)
 SLP Cancellation Note  Patient Details Name: Katie Waller MRN: [id number] DOB: Feb 17, 1946   Cancelled treatment:        Pt continues on vent support and has not initiated trach collar per RN. Will follow for PMV when on trach collar.    Dustin Olam Bull 07/18/2024, 2:50 PM

## 2024-07-18 NOTE — Progress Notes (Signed)
 PHARMACY - ANTICOAGULATION CONSULT NOTE  Pharmacy Consult:  Bivalirudin  Indication: atrial fibrillation and internal jugular thombus   Allergies  Allergen Reactions   Ace Inhibitors Swelling   Diphenhydramine Other (See Comments)    Hyperactive/jittery    Doxycycline Hyclate Nausea And Vomiting   Codeine Nausea And Vomiting    Patient Measurements: Height: 5' 1 (154.9 cm) Weight: 75.3 kg (166 lb 0.1 oz) IBW/kg (Calculated) : 47.8 HEPARIN DW (KG): 63.1  Vital Signs: Temp: 97.6 F (36.4 C) (11/13 0245) Temp Source: Axillary (11/13 0245) BP: 132/61 (11/13 0100) Pulse Rate: 89 (11/13 0649)  Labs: Recent Labs    07/16/24 0200 07/16/24 0827 07/17/24 0105 07/17/24 0458 07/17/24 1631 07/18/24 0312  HGB 9.1*   < > 9.9* 9.1*  --  9.8*  HCT 28.6*   < > 29.0* 28.7*  --  31.3*  PLT 209  --   --  205  --  228  APTT 100*   < >  --  64* 71* 76*  CREATININE 2.89*   < >  --  2.03* 1.51* 1.62*  1.60*   < > = values in this interval not displayed.    Estimated Creatinine Clearance: 28.2 mL/min (A) (by C-G formula based on SCr of 1.6 mg/dL (H)).   Medical History: Past Medical History:  Diagnosis Date   Abnormal finding on EKG 09/19/2013   Acute on chronic respiratory failure with hypercapnia (HCC)    Acute respiratory failure (HCC) 06/08/2016   Anxiety 11/09/2020   Arthritis    Asthma    Carotid bruit 11/09/2020   Chronic diastolic CHF (congestive heart failure) (HCC) 07/28/2017   Chronic obstructive pulmonary disease, unspecified (HCC) 11/09/2020   COPD, group D, by GOLD 2017 classification (HCC) 09/17/2013   Cough 11/09/2020   Decreased estrogen level 11/09/2020   Diabetes (HCC)    Dyspnea    Edema 11/09/2020   Essential hypertension 09/18/2013   Goals of care, counseling/discussion    Hardening of the aorta (main artery of the heart) 11/09/2020   Heart disease    History of radiation therapy    Left Lung- 06/20/23-06/26/23- Dr. 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  She was previously on IV heparin and was switched to bivalirudin due to labile heparin levels.  Med was stopped because it was thought that Afib was secondary to critical illness, which resolved. However patient went back into Afib w/ RVR on 11/7.   aPTT remains therapeutic at 76 on current rate. Hb 9.8, plt 228. No issues per RN.   Goal of Therapy:  aPTT 50-85 seconds Monitor platelets by anticoagulation protocol: Yes   Plan:  Continue bivalrudin to 0.1 mg/kg/hr.  Using weight of 66 kg which is what weight had been used previously while patient was therapeutic on bival.  aPTT BID.  Daily CBC  Rankin Sams, PharmD, BCPS, BCCCP Clinical Pharmacist

## 2024-07-18 NOTE — Progress Notes (Signed)
 PHARMACY - ANTICOAGULATION CONSULT NOTE  Pharmacy Consult:  Bivalirudin  Indication: atrial fibrillation and internal jugular thombus   Allergies  Allergen Reactions   Ace Inhibitors Swelling   Diphenhydramine Other (See Comments)    Hyperactive/jittery    Doxycycline Hyclate Nausea And Vomiting   Codeine Nausea And Vomiting    Patient Measurements: Height: 5' 1 (154.9 cm) Weight: 75.3 kg (166 lb 0.1 oz) IBW/kg (Calculated) : 47.8 HEPARIN DW (KG): 63.1  Vital Signs: Temp: 100.6 F (38.1 C) (11/13 1500) Temp Source: Oral (11/13 1500) BP: 125/51 (11/13 1432) Pulse Rate: 97 (11/13 1500)  Labs: Recent Labs    07/16/24 0200 07/16/24 0827 07/17/24 0458 07/17/24 1631 07/18/24 0312 07/18/24 1158 07/18/24 1537 07/18/24 1613  HGB 9.1*   < > 9.1*  --  9.8* 14.6 9.9*  --   HCT 28.6*   < > 28.7*  --  31.3* 43.0 29.0*  --   PLT 209  --  205  --  228  --   --   --   APTT 100*   < > 64* 71* 76*  --   --  69*  CREATININE 2.89*   < > 2.03* 1.51* 1.62*  1.60*  --   --  1.17*   < > = values in this interval not displayed.    Estimated Creatinine Clearance: 38.6 mL/min (A) (by C-G formula based on SCr of 1.17 mg/dL (H)).   Medical History: Past Medical History:  Diagnosis Date   Abnormal finding on EKG 09/19/2013   Acute on chronic respiratory failure with hypercapnia (HCC)    Acute respiratory failure (HCC) 06/08/2016   Anxiety 11/09/2020   Arthritis    Asthma    Carotid bruit 11/09/2020   Chronic diastolic CHF (congestive heart failure) (HCC) 07/28/2017   Chronic obstructive pulmonary disease, unspecified (HCC) 11/09/2020   COPD, group D, by GOLD 2017 classification (HCC) 09/17/2013   Cough 11/09/2020   Decreased estrogen level 11/09/2020   Diabetes (HCC)    Dyspnea    Edema 11/09/2020   Essential hypertension 09/18/2013   Goals of care, counseling/discussion    Hardening of the aorta (main artery of the heart) 11/09/2020   Heart disease    History of radiation  therapy    Left Lung- 06/20/23-06/26/23- Dr. 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  She was previously on IV heparin and was switched to bivalirudin due to labile heparin levels.  Med was stopped because it was thought that Afib was secondary to critical illness, which resolved. However patient went back into Afib w/ RVR on 11/7.   aPTT 69, therapeutic  Per RN, small amount of bloody secretions inline but not concerning about at this time. No issues with infusion noted.   Goal of Therapy:  aPTT 50-85 seconds Monitor platelets by anticoagulation protocol: Yes   Plan:  Continue bivalrudin to 0.1 mg/kg/hr.  Using weight of  66 kg which is what weight had been used previously while patient was therapeutic on bival.  aPTT BID.  Daily CBC  Thank you for allowing pharmacy to participate in this patient's care.  Leonor GORMAN Bash, PharmD Emergency Medicine Clinical Pharmacist 07/18/2024,5:20 PM

## 2024-07-18 NOTE — Progress Notes (Signed)
 Patient transferred to [facility] ICU with no issues.  Will continue to monitor.

## 2024-07-18 NOTE — Plan of Care (Signed)
  Problem: Clinical Measurements: Goal: Ability to maintain clinical measurements within normal limits will improve Outcome: Progressing Goal: Will remain free from infection Outcome: Progressing Goal: Diagnostic test results will improve Outcome: Not Progressing Goal: Respiratory complications will improve Outcome: Not Progressing Goal: Cardiovascular complication will be avoided Outcome: Progressing   Problem: Nutrition: Goal: Adequate nutrition will be maintained Outcome: Not Progressing   Problem: Safety: Goal: Ability to remain free from injury will improve Outcome: Progressing

## 2024-07-19 ENCOUNTER — Inpatient Hospital Stay (HOSPITAL_COMMUNITY)

## 2024-07-19 DIAGNOSIS — G929 Unspecified toxic encephalopathy: Secondary | ICD-10-CM | POA: Diagnosis not present

## 2024-07-19 DIAGNOSIS — J449 Chronic obstructive pulmonary disease, unspecified: Secondary | ICD-10-CM | POA: Diagnosis not present

## 2024-07-19 DIAGNOSIS — B9781 Human metapneumovirus as the cause of diseases classified elsewhere: Secondary | ICD-10-CM | POA: Diagnosis not present

## 2024-07-19 DIAGNOSIS — Z930 Tracheostomy status: Secondary | ICD-10-CM

## 2024-07-19 DIAGNOSIS — J9620 Acute and chronic respiratory failure, unspecified whether with hypoxia or hypercapnia: Secondary | ICD-10-CM | POA: Diagnosis not present

## 2024-07-19 LAB — CBC
HCT: 27.4 % — ABNORMAL LOW (ref 36.0–46.0)
Hemoglobin: 8.7 g/dL — ABNORMAL LOW (ref 12.0–15.0)
MCH: 30.3 pg (ref 26.0–34.0)
MCHC: 31.8 g/dL (ref 30.0–36.0)
MCV: 95.5 fL (ref 80.0–100.0)
Platelets: 174 K/uL (ref 150–400)
RBC: 2.87 MIL/uL — ABNORMAL LOW (ref 3.87–5.11)
RDW: 15.4 % (ref 11.5–15.5)
WBC: 6.3 K/uL (ref 4.0–10.5)
nRBC: 5.7 % — ABNORMAL HIGH (ref 0.0–0.2)

## 2024-07-19 LAB — POCT I-STAT 7, (LYTES, BLD GAS, ICA,H+H)
Acid-Base Excess: 4 mmol/L — ABNORMAL HIGH (ref 0.0–2.0)
Acid-base deficit: 2 mmol/L (ref 0.0–2.0)
Bicarbonate: 30.3 mmol/L — ABNORMAL HIGH (ref 20.0–28.0)
Bicarbonate: 24.3 mmol/L (ref 20.0–28.0)
Calcium, Ion: 1.21 mmol/L (ref 1.15–1.40)
Calcium, Ion: 1.11 mmol/L — ABNORMAL LOW (ref 1.15–1.40)
HCT: 39 % (ref 36.0–46.0)
HCT: 26 % — ABNORMAL LOW (ref 36.0–46.0)
Hemoglobin: 13.3 g/dL (ref 12.0–15.0)
Hemoglobin: 8.8 g/dL — ABNORMAL LOW (ref 12.0–15.0)
O2 Saturation: 98 %
O2 Saturation: 99 %
Patient temperature: 98.8
Patient temperature: 98.2
Potassium: 4.1 mmol/L (ref 3.5–5.1)
Potassium: 5.7 mmol/L — ABNORMAL HIGH (ref 3.5–5.1)
Sodium: 132 mmol/L — ABNORMAL LOW (ref 135–145)
Sodium: 131 mmol/L — ABNORMAL LOW (ref 135–145)
TCO2: 32 mmol/L (ref 22–32)
TCO2: 26 mmol/L (ref 22–32)
pCO2 arterial: 49.8 mmHg — ABNORMAL HIGH (ref 32–48)
pCO2 arterial: 47.3 mmHg (ref 32–48)
pH, Arterial: 7.393 (ref 7.35–7.45)
pH, Arterial: 7.318 — ABNORMAL LOW (ref 7.35–7.45)
pO2, Arterial: 107 mmHg (ref 83–108)
pO2, Arterial: 126 mmHg — ABNORMAL HIGH (ref 83–108)

## 2024-07-19 LAB — APTT
aPTT: 75 s — ABNORMAL HIGH (ref 24–36)
aPTT: 65 s — ABNORMAL HIGH (ref 24–36)

## 2024-07-19 LAB — GLUCOSE, CAPILLARY
Glucose-Capillary: 120 mg/dL — ABNORMAL HIGH (ref 70–99)
Glucose-Capillary: 68 mg/dL — ABNORMAL LOW (ref 70–99)
Glucose-Capillary: 243 mg/dL — ABNORMAL HIGH (ref 70–99)
Glucose-Capillary: 67 mg/dL — ABNORMAL LOW (ref 70–99)
Glucose-Capillary: 135 mg/dL — ABNORMAL HIGH (ref 70–99)
Glucose-Capillary: 109 mg/dL — ABNORMAL HIGH (ref 70–99)
Glucose-Capillary: 141 mg/dL — ABNORMAL HIGH (ref 70–99)
Glucose-Capillary: 124 mg/dL — ABNORMAL HIGH (ref 70–99)

## 2024-07-19 LAB — RENAL FUNCTION PANEL
Albumin: 2.2 g/dL — ABNORMAL LOW (ref 3.5–5.0)
Albumin: 2.2 g/dL — ABNORMAL LOW (ref 3.5–5.0)
Anion gap: 12 (ref 5–15)
Anion gap: 13 (ref 5–15)
BUN: 32 mg/dL — ABNORMAL HIGH (ref 8–23)
BUN: 22 mg/dL (ref 8–23)
CO2: 26 mmol/L (ref 22–32)
CO2: 26 mmol/L (ref 22–32)
Calcium: 8.6 mg/dL — ABNORMAL LOW (ref 8.9–10.3)
Calcium: 8.3 mg/dL — ABNORMAL LOW (ref 8.9–10.3)
Chloride: 96 mmol/L — ABNORMAL LOW (ref 98–111)
Chloride: 96 mmol/L — ABNORMAL LOW (ref 98–111)
Creatinine, Ser: 1.17 mg/dL — ABNORMAL HIGH (ref 0.44–1.00)
Creatinine, Ser: 0.91 mg/dL (ref 0.44–1.00)
GFR, Estimated: 49 mL/min — ABNORMAL LOW (ref >60)
GFR, Estimated: >60 mL/min (ref >60)
Glucose, Bld: 120 mg/dL — ABNORMAL HIGH (ref 70–99)
Glucose, Bld: 191 mg/dL — ABNORMAL HIGH (ref 70–99)
Phosphorus: 1.9 mg/dL — ABNORMAL LOW (ref 2.5–4.6)
Phosphorus: 3.4 mg/dL (ref 2.5–4.6)
Potassium: 4.3 mmol/L (ref 3.5–5.1)
Potassium: 4.1 mmol/L (ref 3.5–5.1)
Sodium: 134 mmol/L — ABNORMAL LOW (ref 135–145)
Sodium: 135 mmol/L (ref 135–145)

## 2024-07-19 LAB — MAGNESIUM: Magnesium: 2.4 mg/dL (ref 1.7–2.4)

## 2024-07-19 MED ORDER — MIDAZOLAM HCL (PF) 2 MG/2ML IJ SOLN
2.0000 mg | Freq: Once | INTRAMUSCULAR | Status: AC
  Administered 2024-07-19: 2 mg via INTRAVENOUS

## 2024-07-19 MED ORDER — MIDAZOLAM HCL 2 MG/2ML IJ SOLN
INTRAMUSCULAR | Status: AC
  Filled 2024-07-19: qty 2

## 2024-07-19 MED ORDER — AMIODARONE IV BOLUS ONLY 150 MG/100ML
150.0000 mg | Freq: Once | INTRAVENOUS | Status: AC
  Administered 2024-07-19: 150 mg via INTRAVENOUS
  Filled 2024-07-19: qty 100

## 2024-07-19 MED ORDER — SODIUM PHOSPHATES 45 MMOLE/15ML IV SOLN
30.0000 mmol | Freq: Once | INTRAVENOUS | Status: AC
  Administered 2024-07-19: 30 mmol via INTRAVENOUS
  Filled 2024-07-19: qty 10

## 2024-07-19 MED ORDER — DEXTROSE 10 % IV SOLN: INTRAVENOUS | Status: DC

## 2024-07-19 MED ORDER — EPINEPHRINE 1 MG/10ML IV SOSY
PREFILLED_SYRINGE | INTRAVENOUS | Status: AC
  Administered 2024-07-19: 1 mg
  Filled 2024-07-19: qty 10

## 2024-07-19 MED ORDER — DEXTROSE 20 % IV SOLN
INTRAVENOUS | Status: AC
  Filled 2024-07-19 (×6): qty 500

## 2024-07-19 MED ORDER — VASOPRESSIN 20 UNITS/100 ML INFUSION FOR SHOCK
0.0000 [IU]/min | INTRAVENOUS | Status: DC
  Administered 2024-07-20 – 2024-07-22 (×3): 0.03 [IU]/min via INTRAVENOUS
  Administered 2024-07-23: 0.01 [IU]/min via INTRAVENOUS
  Administered 2024-07-23: 0.03 [IU]/min via INTRAVENOUS
  Filled 2024-07-19 (×5): qty 100

## 2024-07-19 MED ORDER — DEXTROSE 50 % IV SOLN
INTRAVENOUS | Status: AC
Start: 2024-07-19 — End: 2024-07-19
  Administered 2024-07-19: 50 mL
  Filled 2024-07-19: qty 50

## 2024-07-19 MED ORDER — FENTANYL 2500MCG IN NS 250ML (10MCG/ML) PREMIX INFUSION
0.0000 ug/h | INTRAVENOUS | Status: DC
  Administered 2024-07-19 (×2): 50 ug/h via INTRAVENOUS
  Administered 2024-07-20: 100 ug/h via INTRAVENOUS
  Administered 2024-07-21 – 2024-07-23 (×2): 50 ug/h via INTRAVENOUS
  Filled 2024-07-19 (×4): qty 250

## 2024-07-19 MED ORDER — FENTANYL CITRATE (PF) 50 MCG/ML IJ SOSY: 25.0000 ug | PREFILLED_SYRINGE | Freq: Once | INTRAMUSCULAR | Status: DC

## 2024-07-19 MED ORDER — VASOPRESSIN 20 UNITS/100 ML INFUSION FOR SHOCK
INTRAVENOUS | Status: AC
  Administered 2024-07-19: 0.03 [IU]/min via INTRAVENOUS
  Filled 2024-07-19: qty 100

## 2024-07-19 MED ORDER — FENTANYL CITRATE (PF) 50 MCG/ML IJ SOSY
25.0000 ug | PREFILLED_SYRINGE | INTRAMUSCULAR | Status: DC | PRN
  Administered 2024-07-19 (×2): 50 ug via INTRAVENOUS
  Filled 2024-07-19 (×2): qty 1

## 2024-07-19 MED ORDER — FENTANYL BOLUS VIA INFUSION
25.0000 ug | INTRAVENOUS | Status: DC | PRN
  Administered 2024-07-19 (×2): 100 ug via INTRAVENOUS
  Administered 2024-07-19: 75 ug via INTRAVENOUS
  Administered 2024-07-19: 100 ug via INTRAVENOUS
  Administered 2024-07-19 (×3): 75 ug via INTRAVENOUS
  Administered 2024-07-20 (×3): 50 ug via INTRAVENOUS
  Administered 2024-07-20: 25 ug via INTRAVENOUS
  Administered 2024-07-20 (×3): 50 ug via INTRAVENOUS
  Administered 2024-07-20 (×2): 25 ug via INTRAVENOUS
  Administered 2024-07-20 – 2024-07-21 (×2): 50 ug via INTRAVENOUS
  Administered 2024-07-21: 75 ug via INTRAVENOUS
  Administered 2024-07-21: 50 ug via INTRAVENOUS
  Administered 2024-07-21: 75 ug via INTRAVENOUS
  Administered 2024-07-21 – 2024-07-22 (×3): 50 ug via INTRAVENOUS
  Administered 2024-07-22: 100 ug via INTRAVENOUS
  Administered 2024-07-22: 75 ug via INTRAVENOUS
  Administered 2024-07-22: 100 ug via INTRAVENOUS
  Administered 2024-07-23 – 2024-07-24 (×4): 50 ug via INTRAVENOUS

## 2024-07-19 MED ORDER — DEXTROSE 50 % IV SOLN
INTRAVENOUS | Status: AC
  Administered 2024-07-19: 50 mL
  Filled 2024-07-19: qty 50

## 2024-07-19 NOTE — Progress Notes (Signed)
 PHARMACY - ANTICOAGULATION CONSULT NOTE  Pharmacy Consult:  Bivalirudin  Indication: atrial fibrillation and internal jugular thombus   Allergies  Allergen Reactions   Ace Inhibitors Swelling   Diphenhydramine Other (See Comments)    Hyperactive/jittery    Doxycycline Hyclate Nausea And Vomiting   Codeine Nausea And Vomiting    Patient Measurements: Height: 5' 1 (154.9 cm) Weight: 76.3 kg (168 lb 3.4 oz) IBW/kg (Calculated) : 47.8 HEPARIN DW (KG): 63.1  Vital Signs: Temp: 99.3 F (37.4 C) (11/14 0353) Temp Source: Axillary (11/14 0353) Pulse Rate: 106 (11/14 0700)  Labs: Recent Labs    07/17/24 0458 07/17/24 1631 07/18/24 0312 07/18/24 1158 07/18/24 1613 07/18/24 1956 07/19/24 0312 07/19/24 0515  HGB 9.1*  --  9.8*   < >  --  9.9* 13.3 8.7*  HCT 28.7*  --  31.3*   < >  --  29.0* 39.0 27.4*  PLT 205  --  228  --   --   --   --  174  APTT 64*   < > 76*  --  69*  --   --  75*  CREATININE 2.03*   < > 1.62*  1.60*  --  1.17*  --   --  1.17*   < > = values in this interval not displayed.    Estimated Creatinine Clearance: 38.8 mL/min (A) (by C-G formula based on SCr of 1.17 mg/dL (H)).   Medical History: Past Medical History:  Diagnosis Date   Abnormal finding on EKG 09/19/2013   Acute on chronic respiratory failure with hypercapnia (HCC)    Acute respiratory failure (HCC) 06/08/2016   Anxiety 11/09/2020   Arthritis    Asthma    Carotid bruit 11/09/2020   Chronic diastolic CHF (congestive heart failure) (HCC) 07/28/2017   Chronic obstructive pulmonary disease, unspecified (HCC) 11/09/2020   COPD, group D, by GOLD 2017 classification (HCC) 09/17/2013   Cough 11/09/2020   Decreased estrogen level 11/09/2020   Diabetes (HCC)    Dyspnea    Edema 11/09/2020   Essential hypertension 09/18/2013   Goals of care, counseling/discussion    Hardening of the aorta (main artery of the heart) 11/09/2020   Heart disease    History of radiation therapy    Left Lung-  06/20/23-06/26/23- Dr. 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  She was previously on IV heparin and was switched to bivalirudin due to labile heparin levels.  Med was stopped because it was thought that Afib was secondary to critical illness, which resolved. However patient went back into Afib w/ RVR on 11/7.   aPTT remains therapeutic at 75 on current rate. Hb 8.7, plt 174. Some bloody secretions overnight in inline suctioning, but none today per RN.    Goal of Therapy:  aPTT 50-85 seconds Monitor platelets by anticoagulation protocol: Yes   Plan:  Continue bivalrudin to 0.1 mg/kg/hr.  Using weight of 66 kg which is what weight had been used previously while patient was therapeutic on bival.  aPTT BID.  Daily CBC  Rankin Sams, PharmD, BCPS, BCCCP Clinical Pharmacist

## 2024-07-19 NOTE — Progress Notes (Addendum)
 NAMETAMBI THOLE, MRN:  [id number], DOB:  1946-06-05, LOS: 24 ADMISSION DATE:  06/25/2024, CONSULTATION DATE:  06/25/2024 REFERRING MD:  Theodoro SAUNDERS, CHIEF COMPLAINT:  AMS, Sepsis, AR  History of Present Illness:  Donnica Jarnagin is a 78 yo female with past medical history significant for HTN, COPD, acute on chronic hypoxic, hypercapnic respiratory failure on 5-6L [place] baseline, diastolic heart failure, SCC Left lower lobe s/p radiation in September 2025, who presented via EMS after being found unresponsive at home. Code stroke called. On arrival to ED patient emergently intubated for acute respiratory distress and airway protection. CT Head with no ICH or large infarct, CTA head and neck with no LVO. CXR concerning for a Pneumonia with visible BL lung apices on CTA demonstrating infection/inflammation. Code stroke cancelled with presentation suspicious for sepsis. Patient received 30ml/kg crystalloid, pan cultured, and started empirically on Cefepime/Vanc/Azithromycin . Of note, family reports patient has recently not felt well and likely has had a cold. PCCM consulted for ICU admission.   On exam in ED, patient febrile to >101 and hypotensive, likely multifactorial in setting of presumed sepsis and recent sedation on induction with underlying heart disease. Norepinephrine gtt was initiated for MAPs < 65 and patient was transferred to ICU with plans to gain central access for vasopressor administration and arterial line placement for hemodynamic monitoring.  Pertinent  Medical History  Per above  Significant Hospital Events: Including procedures, antibiotic start and stop dates in addition to other pertinent events   10/21 Found unresponsive at home->ED>Code stroke>intubated for ARD/airway protection>CTH/CTA negative>Code stroke cancelled>Hypotensive>started on pressors>Admit to ICU  Arterial line placed 10/21 10/22 and 10/23 unsuccessful extubation 10/24: Despite maximal Precedex and fentanyl   pushes, unable to safely and comfortably clean the patient up so added prn Versed and started fentanyl  infusion. Rectal tube added for diarrhea.  10/24: 2/3 BC resulted in staph capitis/epidermitis, extubated on BiPAP 10/25: patient remained on BiPAP for 24 hours, when she came off of BiPAP, could not tolerate became tachypneic, tachycardic went into respiratory distress requiring endotracheal reintubation 10/29: unsuccessful SBT, went briefly back into RVR 10/30: unsuccessful weaning from sedation as BP/HR increased considerably 11/2: extubated to HFNC 11/8: progressively became more hypoxic with decreased respiratory drive prompting ABG this a.m. which revealed toxic hypercapnic respiratory failure.  Transferred back to ICU for impending intubation; tried draining pleural spaces but still tired out 11/10: trach 11/10: aggressive diuresis with Lasix  80 followed by 160 followed by 260 + metolazone and bicarb - no response>Nephrology consulted 11/11: nephro consult. LE weakness noted on exam with inability to move - neuro consulted, MRI C and T spine ordered. CT pulled yesterday, worsening ptx>2 R CT placed, ptx improved. CRRT started. Hypotensive>briefly on Levo 11/12: Levo stopped. MRI C spine>cervical stenosis  Interim History / Subjective:  Weaned off levophed this morning Improved acidosis Fever 102  Objective    Blood pressure (!) 125/51, pulse (!) 106, temperature (!) 102 F (38.9 C), temperature source Oral, resp. rate (!) 30, height 5' 1 (1.549 m), weight 76.3 kg, SpO2 100%. CVP:  [7 mmHg-25 mmHg] 12 mmHg  Vent Mode: PRVC FiO2 (%):  [40 %] 40 % Set Rate:  [34 bmp] 34 bmp Vt Set:  [400 mL] 400 mL PEEP:  [5 cmH20] 5 cmH20 Plateau Pressure:  [19 cmH20-27 cmH20] 22 cmH20   Intake/Output Summary (Last 24 hours) at 07/19/2024 0842 Last data filed at 07/19/2024 0800 Gross per 24 hour  Intake 2805.64 ml  Output 6276 ml  Net -3470.36  ml   Filed Weights   07/17/24 0500 07/18/24  0230 07/19/24 0500  Weight: 75.3 kg 75.3 kg 76.3 kg   Physical Exam: General: Chronically ill-appearing, no acute distress HENT: [place], AT, OP clear, MMM Neck: Trach in place, c/d/i Eyes: EOMI, no scleral icterus Respiratory: Coarse breath sounds to auscultation bilaterally.  No wheezing or rales Cardiovascular: RRR, -M/R/G, no JVD GI: BS+, soft, nontender Extremities: Anasarca,-tenderness Neuro: Awake, nodding, follows commands, CNII-XII grossly intact GU: Foley in place  Imaging, labs and test in EMR in the last 24 hours reviewed independently by me. Pertinent findings below: ABG 7.393/49/107 CO2 26, previously in 30s Phos 1.9 Hg 8.7 stable  Resolved problem list   Assessment and Plan  Acute metabolic encephalopathy - resolved - Continue dialysis and vent support as noted below  Acute on chronic hypoxic & hypercapnic respiratory failure s/p trach 11/9 Metapneumovirus Fluid overload - baseline weight per daughter around 146-150 lbs BL effusions: s/p thoracentesis 11/8 600 cc transudative fluid Right PTX s/p chest tube: #1 R pigtail CT pulled 11/10; #2 R CT remained; immediate recurring ptx>R pigtail re-placed>both CT to - . No air leak.>CT pulled 11/12.  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 Unable to reach therapeutic levels on heparin  Anuric AKI Likely 2/2 sepsis/pneumonia and the use of antibiotics  CRRT-related hypotension-resolved - Appreciate Nephrology input. CRRT 11/11 - Bicarb  gtt - avoid nephrotoxins - Trend BMET  Hypoglycemia - D20 gtt  Hypokalemia Hypomag - Replete  Ileus - Holding tube feeds - Bowel regimen  LE weakness and concern for paralysis- -New finding on exam 11/11-able to wiggle toes on exam 11/12 AM-sensory intact to BLE -Nsgy consulted>MRI showed cervical stenosis likely explains BLE weakness    Acute on chronic pain -Scheduled Acetaminophen  1g q8, PRN fentanyl  - Held Methocarbamol 500mg  tid, Gabapentin (home med) 100mg  tid, Oxy 5mg  PRN, Dilaudid 0.5mg  PRN   GOC - Guarded prognosis in setting of poor lung mechanics and frailty. Updated sister on critical illness with some improvement today but concern for high risk for cardiac event in setting of life support. Discussed QOL with family. - Sister and brother have confirmed to remain full code.   The patient is critically ill with multiple organ systems failure and requires high complexity decision making for assessment and support, frequent evaluation and titration of therapies, application of advanced monitoring technologies and extensive interpretation of multiple databases.  Independent Critical Care Time: 60 Minutes.   Slater Staff, M.D. [facility] Pulmonary/Critical Care Medicine 07/19/2024 8:43 AM   Please see Amion for pager number to reach on-call Pulmonary and Critical Care Team.

## 2024-07-19 NOTE — Progress Notes (Signed)
 eLink Physician-Brief Progress Note Patient Name: Katie Waller DOB: 1946-01-01 MRN: [id number]   Date of Service  07/19/2024  HPI/Events of Note  Patient has blood sugar trending down and enteral feeding is on hold.  eICU Interventions  D 10 gtt at 20 ml / hour x 15 hours ordered.        Isabell Bonafede U Wm Sahagun 07/19/2024, 1:10 AM

## 2024-07-19 NOTE — Progress Notes (Signed)
 PHARMACY - ANTICOAGULATION CONSULT NOTE  Pharmacy Consult:  Bivalirudin  Indication: atrial fibrillation and internal jugular thombus   Allergies  Allergen Reactions   Ace Inhibitors Swelling   Diphenhydramine Other (See Comments)    Hyperactive/jittery    Doxycycline Hyclate Nausea And Vomiting   Codeine Nausea And Vomiting    Patient Measurements: Height: 5' 1 (154.9 cm) Weight: 76.3 kg (168 lb 3.4 oz) IBW/kg (Calculated) : 47.8 HEPARIN DW (KG): 63.1  Vital Signs: Temp: 97.8 F (36.6 C) (11/14 1530) Temp Source: Axillary (11/14 1530) Pulse Rate: 120 (11/14 1624)  Labs: Recent Labs    07/17/24 0458 07/17/24 1631 07/18/24 0312 07/18/24 1158 07/18/24 1613 07/18/24 1956 07/19/24 0312 07/19/24 0515 07/19/24 1629  HGB 9.1*  --  9.8*   < >  --  9.9* 13.3 8.7*  --   HCT 28.7*  --  31.3*   < >  --  29.0* 39.0 27.4*  --   PLT 205  --  228  --   --   --   --  174  --   APTT 64*   < > 76*  --  69*  --   --  Katie* 65*  CREATININE 2.03*   < > 1.62*  1.60*  --  1.17*  --   --  1.17* 0.91   < > = values in this interval not displayed.    Estimated Creatinine Clearance: 49.9 mL/min (by C-G formula based on SCr of 0.91 mg/dL).   Medical History: Past Medical History:  Diagnosis Date   Abnormal finding on EKG 09/19/2013   Acute on chronic respiratory failure with hypercapnia (HCC)    Acute respiratory failure (HCC) 06/08/2016   Anxiety 11/09/2020   Arthritis    Asthma    Carotid bruit 11/09/2020   Chronic diastolic CHF (congestive heart failure) (HCC) 07/28/2017   Chronic obstructive pulmonary disease, unspecified (HCC) 11/09/2020   COPD, group D, by GOLD 2017 classification (HCC) 09/17/2013   Cough 11/09/2020   Decreased estrogen level 11/09/2020   Diabetes (HCC)    Dyspnea    Edema 11/09/2020   Essential hypertension 09/18/2013   Goals of care, counseling/discussion    Hardening of the aorta (main artery of the heart) 11/09/2020   Heart disease    History of  radiation therapy    Left Lung- 06/20/23-06/26/23- Dr. 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  She was previously on IV heparin and was switched to bivalirudin due to labile heparin levels.  Med was stopped because it was thought that Afib was secondary to critical illness, which resolved. However patient went back into Afib w/ RVR on 11/7.   aPTT 65, therapeutic Still noting small amount of bloody secretions as noted previously. No overt s/sx of bleeding of concern at this time. No issues with infusion at this time.  Goal of Therapy:  aPTT 50-85 seconds Monitor platelets by  anticoagulation protocol: Yes   Plan:  Continue bivalrudin to 0.1 mg/kg/hr.  Using weight of 66 kg which is what weight had been used previously while patient was therapeutic on bival.  aPTT BID.  Daily CBC  Thank you for allowing pharmacy to participate in this patient's care.  Leonor GORMAN Bash, PharmD Emergency Medicine Clinical Pharmacist 07/19/2024,6:04 PM

## 2024-07-19 NOTE — Progress Notes (Signed)
 eLink Physician-Brief Progress Note Patient Name: Katie Waller DOB: 1946/03/10 MRN: [id number]   Date of Service  07/19/2024  HPI/Events of Note  Patient with abrupt drop in blood pressure after receiving a dose of iv Fentanyl , bedside RN also concerned about diminished breath sounds on the right.  eICU Interventions  Stat portable CXR and ABG ordered, Levophed rapidly titrated up and Vasopressin added, 250 ml NS bolus ordered and CRRT paused. ABG is reassuring, CXR show pneumothorax but patient has a chest tube in place. Blood pressure recovered with above measure.        Saraia Platner U Braysen Cloward 07/19/2024, 8:43 PM

## 2024-07-19 NOTE — Progress Notes (Addendum)
 Nutrition Follow-up  DOCUMENTATION CODES:  Not applicable  INTERVENTION:  When able to restart enteral feeds, recommend the following: [employer] 1.4 RTH at 50 ml/h (1200 ml per day) Start at 20 and advance by 10mL every 8 hours to reach goal Prosource TF20 60 ml BID Provides 1840 kcal, 114 gm protein, 864 ml free water daily If unable to restart enteral feeds 11/15, recommend initiation of TPN to meet nutrition needs as pt has been with poor nutritional intake >50% of her admission Nutrition recommendations are below Juven BID, each packet provides 95 calories, 2.5 grams of protein (collagen) + micronutrients to support wound healing B complex with c daily  NUTRITION DIAGNOSIS:  Inadequate oral intake related to acute illness as evidenced by NPO status. Ongoing.   GOAL:  Patient will meet greater than or equal to 90% of their needs Not met, NPO  MONITOR:  Vent status, Labs, Weight trends, TF tolerance  REASON FOR ASSESSMENT:  Consult  (CRRT)  ASSESSMENT:  Pt admitted with AMS, sepsis, ARF. 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  10/21 - admitted d/t PNA, HMPV infection; intubated; stroke/sz ruled out 10/23 - failed SBT, TF initiated 10/24 - extubated 10/25 - re-intubated 11/2 - extubated, no access for TF 11/03 - advanced to clear liquid diet 11/04 - advanced to full liquid diet 11/05 - transfer out of ICU; photo of sacral wound; doc as DTI 11/07 - Cortrak placed, TF started 11/08 - hypoxic; transfer to ICU (4N) 11/09 - trach placed; echo: dilated IVC and RA pressures 11/12 - FMS inserted, only small amount of stool in tubing; noted sacral wound documented as stage II 11/13 - transferred to 36M  Patient is currently intubated on ventilator support via tracheostomy. Resting in bed at the time of assessment, not engaged or alert. Daughter at bedside.  Pt discussed  during ICU rounds and with RN and MD. Having a difficult time controlling pain, sacral wound is causing a lot of discomfort. TF off since yesterday. Pt vomited and KUB showed ileus. NGT placed to suction with >2L out in the last 24 hours. Pt having some BM this AM but minimal amount. Discussed if enteral feeds can not be restarted in the next 24 hours, pt needs to be initiated on TPN as wounds has worsened this admission and muscle deficits are seen on exam now. Like more significant than they appear due to fluid. Still with edema present and still on CRRT. Pt in multiorgan failure.   PICC line already in place, discussed TPN with pharmacy as well if enteral feeds can not be restarted.   Will restart [employer] if able to do enteral as that seemed to be better tolerated than Vital 1.5. Recommendations are above.   MV: 13.3 L/min Temp (24hrs), Avg:99.1 F (37.3 C), Min:96.1 F (35.6 C), Max:102 F (38.9 C) MAP (art line): 62-14mmHg this AM  Admit weight: 71 kg  Current weight: 76.3 kg Pitting edema noted to all extremities   Intake/Output Summary (Last 24 hours) at 07/19/2024 1329 Last data filed at 07/19/2024 1308 Gross per 24 hour  Intake 3280.44 ml  Output 6939 ml  Net -3658.56 ml  Net IO Since Admission: -732.35 mL [07/19/24 1329]  Drains/Lines: PICC double lumen Temporary HD Catheter, left IJ Art Line Chest tube, right NGT, right nare, out x 24 hours FMS 2mL out x 24 hours UOP 1 mL out x 24 hours  Trach #6 shiley cuffed Cortrak, left nare  Average Meal Intake: 10/22-11/4: 17% intake x 3 recorded meals  Nutritionally Relevant Medications: Scheduled Meds:  B-complex with vitamin C  1 tablet Per Tube Daily   PROSource TF20  60 mL Per Tube TID   insulin aspart  0-9 Units Subcutaneous Q4H   JUVEN  1 packet Per Tube BID BM   pantoprazole  40 mg Intravenous Q24H   polyethylene glycol  17 g Per Tube Daily   Continuous Infusions:  bivalirudin (ANGIOMAX) 250 mg in  sodium chloride  0.9 % 500 mL (0.5 mg/mL) infusion 0.1 mg/kg/hr (07/19/24 0900)   dextrose      feeding supplement (VITAL 1.5 CAL) Stopped (07/18/24 1001)   norepinephrine (LEVOPHED) Adult infusion Stopped (07/19/24 0640)   sodium bicarbonate 150 mEq in sterile water 1,150 mL infusion 100 mL/hr at 07/19/24 0900   sodium PHOSPHATE  IVPB (in mmol) 43 mL/hr at 07/19/24 0900   PRN Meds: ondansetron , polyethylene glycol, simethicone  Labs Reviewed: Sodium 134, chloride 96 BUN 32, creatinine 1.17 Phosphorus 1.9 CBG ranges from 64-243 mg/dL over the last 24 hours HgbA1c 5.6%  NUTRITION - FOCUSED PHYSICAL EXAM: Flowsheet Row Most Recent Value  Orbital Region No depletion  Upper Arm Region No depletion  [L arm edema]  Thoracic and Lumbar Region No depletion  Buccal Region No depletion  Temple Region No depletion  Clavicle Bone Region Mild depletion  Clavicle and Acromion Bone Region Moderate depletion  Scapular Bone Region Moderate depletion  Dorsal Hand Moderate depletion  Patellar Region Unable to assess  [edema]  Anterior Thigh Region Unable to assess  [edema]  Posterior Calf Region Unable to assess  [edema]  Edema (RD Assessment) Moderate  Hair Reviewed  Eyes Reviewed  Mouth Unable to assess  Skin Reviewed  Nails Reviewed    Diet Order:   Diet Order             Diet NPO time specified  Diet effective now                   EDUCATION NEEDS:  Not appropriate for education at this time  Skin:  Skin Assessment: Skin Integrity Issues: Skin Integrity Issues:: DTI, Stage II DTI: L heel Stage II: sacrum  Last BM:  11/13 - type 7 via FMS FMS placed 11/12  Height:  Ht Readings from Last 1 Encounters:  06/30/24 5' 1 (1.549 m)    Weight:  Wt Readings from Last 1 Encounters:  07/19/24 76.3 kg    Ideal Body Weight:  47.7 kg  BMI:  Body mass index is 31.78 kg/m.  Estimated Nutritional Needs:  Kcal:  1700-1900 Protein:  110-125 grams Fluid:  >/=1.5L   Vernell Lukes, RD, LDN, CNSC Registered Dietitian II Please reach out via secure chat

## 2024-07-19 NOTE — Progress Notes (Incomplete)
TPN

## 2024-07-19 NOTE — Progress Notes (Signed)
 Pharmacy Electrolyte Replacement  Recent Labs:  Recent Labs    07/19/24 0515  K 4.3  MG 2.4  PHOS 1.9*  CREATININE 1.17*    Low Critical Values (K </= 2.5, Phos </= 1, Mg </= 1) Present: None  MD Contacted: n/a  Plan: 30 mmol IV Na Phos x1 per CRRT protocol   Rankin Sams, PharmD, BCPS, BCCCP Clinical Pharmacist

## 2024-07-19 NOTE — Progress Notes (Signed)
 [name] NEPHROLOGY PROGRESS NOTE  Assessment/ Plan: Pt is a 78 y.o. 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   # Anuric acute kidney injury with evidence of fluid overload likely due to sepsis/pneumonia and the use of antibiotics. She did not respond with high-dose IV diuretics.  UA and kidney ultrasound unremarkable. -Started CRRT on 11/11 after placement of left IJ temporary HD catheter. -She remains anuric without evidence of renal recovery therefore we will continue CRRT.  Evidence of fluid overload therefore we will increase UF goal to 150 cc an hour.   - Continue with strict ins and out and lab monitoring.  Adjust CRRT prescription based on the lab results and clinical response.   # Acute on chronic hypoxic respiratory failure, metapneumovirus pneumonia: Initially treated with broad-spectrum antibiotics which was discontinued later.  Patient required tracheostomy on 11/9 and managing by pulmonary team.   # Bilateral pleural effusion, fluid overload: Patient required chest tube and has been managing by pulmonary team.  # Anemia: Hemoglobin is around 9-9.8.  Monitor and transfuse as needed.  # Hypophosphatemia: Replete IV phosphate, follow lab.  Subjective: Seen and examined in ICU.  No significant urine output.  Remains on Levophed.  Ongoing CRRT with around 100 cc UF, tolerating well.  Daughter at bedside.  Objective Vital signs in last 24 hours: Vitals:   07/19/24 0500 07/19/24 0600 07/19/24 0700 07/19/24 0753  BP:      Pulse: (!) 103 89 (!) 106   Resp: (!) 23 (!) 34 (!) 30   Temp:    (!) 102 F (38.9 C)  TempSrc:    Oral  SpO2: 100% 100% 100%    Weight: 76.3 kg     Height:       Weight change: 1 kg  Intake/Output Summary (Last 24 hours) at 07/19/2024 0908 Last data filed at 07/19/2024 0900 Gross per 24 hour  Intake 2902.56 ml  Output 6119 ml  Net -3216.44 ml       Labs: RENAL PANEL Recent Labs  Lab 07/15/24 0419 07/15/24 2059 07/16/24 0200 07/17/24 0458 07/17/24 1631 07/18/24 0312 07/18/24 1158 07/18/24 1537 07/18/24 1613 07/18/24 1956 07/19/24 0312 07/19/24 0515  NA 139 137   < > 139 134* 136  135   < > 135 135 133* 132* 134*  K 3.9 4.6   < > 5.4* 4.7 4.8  4.8   < > 4.8 4.9 5.0 4.1 4.3  CL 92* 89*   < > 98 95* 99  99  --   --  99  --   --  96*  CO2 33* 31   < > 27 26 26  25   --   --  26  --   --  26  GLUCOSE 136* 181*   < > 183* 170* 185*  182*  --   --  136*  --   --  120*  BUN 52* 69*   < > 54* 38* 46*  47*  --   --  38*  --   --  32*  CREATININE 2.10* 2.74*   < > 2.03* 1.51* 1.62*  1.60*  --   --  1.17*  --   --  1.17*  CALCIUM  8.9 8.5*   < > 8.0* 8.5* 8.8*  8.7*  --   --  8.6*  --   --  8.6*  MG 3.8* 3.4*  --  3.2*  --  2.8*  --   --   --   --   --  2.4  PHOS 6.5*  --    < > 3.4 2.0* 3.9  --   --  3.1  --   --  1.9*  ALBUMIN 2.9*  --    < > 2.4* 2.4* 2.4*  2.5*  --   --  2.4*  --   --  2.2*   < > = values in this interval not displayed.    Liver Function Tests: Recent Labs  Lab 07/16/24 0200 07/16/24 1623 07/17/24 0458 07/17/24 1631 07/18/24 0312 07/18/24 1613 07/19/24 0515  AST 55*  --  52*  --  47*  --   --   ALT 29  --  35  --  39  --   --   ALKPHOS 43  --  47  --  52  --   --   BILITOT 0.4  --  0.6  --  0.7  --   --   PROT 5.3*  --  5.5*  --  6.0*  --   --   ALBUMIN 2.5*   < > 2.4*   < > 2.4*  2.5* 2.4* 2.2*   < > = values in this interval not displayed.   Recent Labs  Lab 07/13/24 0800  LIPASE 99*   No results for input(s): AMMONIA in the last 168 hours. CBC: Recent Labs    07/11/24 0228 07/12/24 0841 07/18/24 0312 07/18/24 1158 07/18/24 1537 07/18/24 1956  07/19/24 0312 07/19/24 0515  HGB 8.9*   < > 9.8* 14.6 9.9* 9.9* 13.3 8.7*  MCV 100.0   < > 96.3  --   --   --   --  95.5  VITAMINB12 1,029*  --   --   --   --   --   --   --   FOLATE >20.0  --   --   --   --   --   --   --   FERRITIN 762*  --   --   --   --   --   --   --   TIBC 227*  --   --   --   --   --   --   --   IRON 44  --   --   --   --   --   --   --   RETICCTPCT 1.7  --   --   --   --   --   --   --    < > = values in this interval not displayed.    Cardiac Enzymes: No results for input(s): CKTOTAL, CKMB, CKMBINDEX, TROPONINI in the last 168 hours. CBG: Recent Labs  Lab 07/18/24 2014 07/18/24 2341 07/19/24 0352 07/19/24 0750 07/19/24 0856  GLUCAP 99 82 120* 68* 243*    Iron Studies: No results for input(s): IRON, TIBC, TRANSFERRIN, FERRITIN in the last 72 hours. Studies/Results:   Medications: Infusions:  bivalirudin (ANGIOMAX) 250 mg in sodium chloride  0.9 % 500 mL (0.5 mg/mL) infusion 0.1 mg/kg/hr (07/19/24 0900)   dextrose      feeding supplement (VITAL 1.5 CAL) Stopped (07/18/24 1001)   norepinephrine (LEVOPHED) Adult infusion Stopped (07/19/24  9359)   PrismaSol BGK 2/3.5 400 mL/hr at 07/19/24 0429   PrismaSol BGK 2/3.5 400 mL/hr at 07/19/24 0429   prismasol BGK 4/2.5 1,500 mL/hr at 07/19/24 0554   sodium bicarbonate 150 mEq in sterile water 1,150 mL infusion 100 mL/hr at 07/19/24 0900   sodium PHOSPHATE  IVPB (in mmol) 43 mL/hr at 07/19/24 0900    Scheduled Medications:  sodium chloride    Intravenous Once   acetaminophen   1,000 mg Per Tube Q8H   amiodarone  200 mg Per Tube Daily   arformoterol   15 mcg Nebulization BID   B-complex with vitamin C  1 tablet Per Tube Daily   budesonide  (PULMICORT ) nebulizer solution  0.5 mg Nebulization BID   Chlorhexidine  Gluconate Cloth  6 each Topical Daily   feeding supplement (PROSource TF20)  60 mL Per Tube TID   insulin aspart  0-9 Units Subcutaneous Q4H   ipratropium-albuterol   3 mL Nebulization  Q6H   lidocaine -EPINEPHrine  20 mL Intradermal Once   nutrition supplement (JUVEN)  1 packet Per Tube BID BM   mouth rinse  15 mL Mouth Rinse Q2H   pantoprazole (PROTONIX) IV  40 mg Intravenous Q24H   polyethylene glycol  17 g Per Tube Daily   sodium chloride  flush  10 mL Intrapleural Q8H   sodium chloride  flush  10-40 mL Intracatheter Q12H    have reviewed scheduled and prn medications.  Physical Exam: General: Ill looking female, trach on vent, not responding to verbal stimuli. Heart:RRR, s1s2 nl Lungs: Coarse breath sound bilateral. Abdomen:soft, Non-tender, non-distended Extremities: Trace peripheral dependent edema present Dialysis Access: Left temporary HD catheter  Katie Waller Katie Waller 07/19/2024,9:08 AM  LOS: 24 days

## 2024-07-19 NOTE — Progress Notes (Signed)
 eLink Physician-Brief Progress Note Patient Name: Katie Waller DOB: 29-Jan-1946 MRN: [id number]   Date of Service  07/19/2024  HPI/Events of Note  Patient with recurrence of an abrupt drop in blood pressure.  eICU Interventions  Ground crew requested to evaluate the chest tube system at bedside.        Megon Kalina U Laurelai Lepp 07/19/2024, 9:35 PM

## 2024-07-19 NOTE — Plan of Care (Signed)
   Problem: Clinical Measurements: Goal: Ability to maintain clinical measurements within normal limits will improve Outcome: Progressing Goal: Will remain free from infection Outcome: Progressing Goal: Diagnostic test results will improve Outcome: Progressing Goal: Respiratory complications will improve Outcome: Progressing Goal: Cardiovascular complication will be avoided Outcome: Progressing   Problem: Safety: Goal: Ability to remain free from injury will improve Outcome: Progressing

## 2024-07-19 NOTE — Progress Notes (Signed)
 Called by eLink for concern of tension pneumothorax given hypotension and increasing vasopressor needs. Chest radiograph showing moderate pneumothorax with pigtail chest tube in place. Chest tube circuit evaluated and flushed, followed by large rush of air into the sahara. Blood pressure improved immediately and became hypertensive. Resumed fentanyl  sedation and gave 10mg  IV hydrlazine.  No changes required on ventilator after evaluation of ABG.   Dorn Chill, MD [facility] Office: [phone]   See Amion for personal pager PCCM on call pager [phone] until 7pm. Please call Elink 7p-7a. [phone]

## 2024-07-20 ENCOUNTER — Inpatient Hospital Stay (HOSPITAL_COMMUNITY)

## 2024-07-20 DIAGNOSIS — B9781 Human metapneumovirus as the cause of diseases classified elsewhere: Secondary | ICD-10-CM | POA: Diagnosis not present

## 2024-07-20 DIAGNOSIS — G929 Unspecified toxic encephalopathy: Secondary | ICD-10-CM | POA: Diagnosis not present

## 2024-07-20 DIAGNOSIS — J449 Chronic obstructive pulmonary disease, unspecified: Secondary | ICD-10-CM | POA: Diagnosis not present

## 2024-07-20 DIAGNOSIS — J9620 Acute and chronic respiratory failure, unspecified whether with hypoxia or hypercapnia: Secondary | ICD-10-CM | POA: Diagnosis not present

## 2024-07-20 LAB — RENAL FUNCTION PANEL
Albumin: 2.1 g/dL — ABNORMAL LOW (ref 3.5–5.0)
Albumin: 2 g/dL — ABNORMAL LOW (ref 3.5–5.0)
Anion gap: 10 (ref 5–15)
Anion gap: 11 (ref 5–15)
BUN: 16 mg/dL (ref 8–23)
BUN: 15 mg/dL (ref 8–23)
CO2: 28 mmol/L (ref 22–32)
CO2: 28 mmol/L (ref 22–32)
Calcium: 7.8 mg/dL — ABNORMAL LOW (ref 8.9–10.3)
Calcium: 7.9 mg/dL — ABNORMAL LOW (ref 8.9–10.3)
Chloride: 95 mmol/L — ABNORMAL LOW (ref 98–111)
Chloride: 98 mmol/L (ref 98–111)
Creatinine, Ser: 0.83 mg/dL (ref 0.44–1.00)
Creatinine, Ser: 0.77 mg/dL (ref 0.44–1.00)
GFR, Estimated: >60 mL/min (ref >60)
GFR, Estimated: >60 mL/min (ref >60)
Glucose, Bld: 144 mg/dL — ABNORMAL HIGH (ref 70–99)
Glucose, Bld: 117 mg/dL — ABNORMAL HIGH (ref 70–99)
Phosphorus: 2.9 mg/dL (ref 2.5–4.6)
Phosphorus: 2.7 mg/dL (ref 2.5–4.6)
Potassium: 4.1 mmol/L (ref 3.5–5.1)
Potassium: 3.8 mmol/L (ref 3.5–5.1)
Sodium: 133 mmol/L — ABNORMAL LOW (ref 135–145)
Sodium: 137 mmol/L (ref 135–145)

## 2024-07-20 LAB — APTT
aPTT: 82 s — ABNORMAL HIGH (ref 24–36)
aPTT: 67 s — ABNORMAL HIGH (ref 24–36)

## 2024-07-20 LAB — POCT I-STAT 7, (LYTES, BLD GAS, ICA,H+H)
Acid-Base Excess: 3 mmol/L — ABNORMAL HIGH (ref 0.0–2.0)
Bicarbonate: 27.8 mmol/L (ref 20.0–28.0)
Calcium, Ion: 1.09 mmol/L — ABNORMAL LOW (ref 1.15–1.40)
HCT: 35 % — ABNORMAL LOW (ref 36.0–46.0)
Hemoglobin: 11.9 g/dL — ABNORMAL LOW (ref 12.0–15.0)
O2 Saturation: 99 %
Patient temperature: 37.2
Potassium: 4 mmol/L (ref 3.5–5.1)
Sodium: 135 mmol/L (ref 135–145)
TCO2: 29 mmol/L (ref 22–32)
pCO2 arterial: 41.3 mmHg (ref 32–48)
pH, Arterial: 7.437 (ref 7.35–7.45)
pO2, Arterial: 116 mmHg — ABNORMAL HIGH (ref 83–108)

## 2024-07-20 LAB — CBC
HCT: 24 % — ABNORMAL LOW (ref 36.0–46.0)
Hemoglobin: 7.7 g/dL — ABNORMAL LOW (ref 12.0–15.0)
MCH: 30.6 pg (ref 26.0–34.0)
MCHC: 32.1 g/dL (ref 30.0–36.0)
MCV: 95.2 fL (ref 80.0–100.0)
Platelets: 206 K/uL (ref 150–400)
RBC: 2.52 MIL/uL — ABNORMAL LOW (ref 3.87–5.11)
RDW: 15.4 % (ref 11.5–15.5)
WBC: 11.2 K/uL — ABNORMAL HIGH (ref 4.0–10.5)
nRBC: 21.9 % — ABNORMAL HIGH (ref 0.0–0.2)

## 2024-07-20 LAB — GLUCOSE, CAPILLARY
Glucose-Capillary: 128 mg/dL — ABNORMAL HIGH (ref 70–99)
Glucose-Capillary: 129 mg/dL — ABNORMAL HIGH (ref 70–99)
Glucose-Capillary: 133 mg/dL — ABNORMAL HIGH (ref 70–99)
Glucose-Capillary: 152 mg/dL — ABNORMAL HIGH (ref 70–99)
Glucose-Capillary: 163 mg/dL — ABNORMAL HIGH (ref 70–99)
Glucose-Capillary: 82 mg/dL (ref 70–99)

## 2024-07-20 LAB — MAGNESIUM: Magnesium: 2.3 mg/dL (ref 1.7–2.4)

## 2024-07-20 MED ORDER — METHYLNALTREXONE BROMIDE 12 MG/0.6ML ~~LOC~~ SOLN
12.0000 mg | Freq: Once | SUBCUTANEOUS | Status: AC
  Administered 2024-07-20: 12 mg via SUBCUTANEOUS
  Filled 2024-07-20: qty 0.6

## 2024-07-20 MED ORDER — POTASSIUM & SODIUM PHOSPHATES 280-160-250 MG PO PACK
2.0000 | PACK | ORAL | Status: AC
  Administered 2024-07-21 (×2): 2
  Filled 2024-07-20 (×2): qty 2

## 2024-07-20 MED ORDER — K PHOS MONO-SOD PHOS DI & MONO 155-852-130 MG PO TABS
500.0000 mg | ORAL_TABLET | ORAL | Status: AC
  Administered 2024-07-20 – 2024-07-21 (×4): 500 mg
  Filled 2024-07-20 (×4): qty 2

## 2024-07-20 MED ORDER — VITAL 1.5 CAL PO LIQD
1000.0000 mL | ORAL | Status: DC
  Administered 2024-07-20: 1000 mL

## 2024-07-20 MED ORDER — FLEET ENEMA RE ENEM
1.0000 | ENEMA | Freq: Once | RECTAL | Status: AC
  Administered 2024-07-20: 1 via RECTAL
  Filled 2024-07-20: qty 1

## 2024-07-20 NOTE — Progress Notes (Signed)
 [name], MRN:  [id number], DOB:  [birth year], LOS: 25 ADMISSION DATE:  06/25/2024, CONSULTATION DATE:  06/25/2024 REFERRING MD:  Katie Waller, CHIEF COMPLAINT:  AMS, Sepsis, AR  History of Present Illness:  Katie Waller is a 78 yo female with past medical history significant for HTN, COPD, acute on chronic hypoxic, hypercapnic respiratory failure on 5-6L [place] baseline, diastolic heart failure, SCC Left lower lobe s/p radiation in September 2025, who presented via EMS after being found unresponsive at home. Code stroke called. On arrival to ED patient emergently intubated for acute respiratory distress and airway protection. CT Head with no ICH or large infarct, CTA head and neck with no LVO. CXR concerning for a Pneumonia with visible BL lung apices on CTA demonstrating infection/inflammation. Code stroke cancelled with presentation suspicious for sepsis. Patient received 30ml/kg crystalloid, pan cultured, and started empirically on Cefepime/Vanc/Azithromycin . Of note, family reports patient has recently not felt well and likely has had a cold. Katie Waller Waller for ICU admission.   On exam in ED, patient febrile to >101 and hypotensive, likely multifactorial in setting of presumed sepsis and recent sedation on induction with underlying heart disease. Norepinephrine gtt was initiated for MAPs < 65 and patient was transferred to ICU with plans to gain central access for vasopressor administration and arterial line placement for hemodynamic monitoring.  Pertinent  Medical History  Per above  Significant Hospital Events: Including procedures, antibiotic start and stop dates in addition to other pertinent events   10/21 Found unresponsive at home->ED>Code stroke>intubated for ARD/airway protection>CTH/CTA negative>Code stroke cancelled>Hypotensive>started on pressors>Admit to ICU  Arterial line placed 10/21 10/22 and 10/23 unsuccessful extubation 10/24: Despite maximal Precedex and fentanyl   pushes, unable to safely and comfortably clean the patient up so added prn Versed and started fentanyl  infusion. Rectal tube added for diarrhea.  10/24: 2/3 BC resulted in staph capitis/epidermitis, extubated on BiPAP 10/25: patient remained on BiPAP for 24 hours, when she came off of BiPAP, could not tolerate became tachypneic, tachycardic went into respiratory distress requiring endotracheal reintubation 10/29: unsuccessful SBT, went briefly back into RVR 10/30: unsuccessful weaning from sedation as BP/HR increased considerably 11/2: extubated to HFNC 11/8: progressively became more hypoxic with decreased respiratory drive prompting ABG this a.m. which revealed toxic hypercapnic respiratory failure.  Transferred back to ICU for impending intubation; tried draining pleural spaces but still tired out 11/10: trach 11/10: aggressive diuresis with Lasix  80 followed by 160 followed by 260 + metolazone and bicarb - no response>Katie Waller 11/11: nephro consult. LE weakness noted on exam with inability to move - neuro Waller, MRI C and T spine ordered. CT pulled yesterday, worsening ptx>2 R CT placed, ptx improved. CRRT started. Hypotensive>briefly on Levo 11/12: Levo stopped. MRI C spine>cervical stenosis 11/14: Worsening hypotension in setting of tension pneumothorax. Resolved after chest tube was flushed with improved pressor requirement. Levophed now 5 mcg/min  Interim History / Subjective:  Overnight had tension pneumothorax secondary to clotted tube. Resolved after chest tube was flushed with improved pressor requirement. Levophed now 5 mcg/min  Minimal vent settings  Minimal chest tube output   Net negative 1.2L in last 24 hours  Last fever 102 yesterday morning  Objective    Blood pressure (!) 125/51, pulse 85, temperature 99 F (37.2 C), temperature source Oral, resp. rate (!) 30, height 5' 1 (1.549 m), weight 71.7 kg, SpO2 100%. CVP:  [8 mmHg-12 mmHg] 12 mmHg  Vent Mode:  PRVC FiO2 (%):  [40 %] 40 % Set  Rate:  [30 bmp-34 bmp] 30 bmp Vt Set:  [380 mL-400 mL] 380 mL PEEP:  [5 cmH20] 5 cmH20 Plateau Pressure:  [18 cmH20-26 cmH20] 18 cmH20   Intake/Output Summary (Last 24 hours) at 07/20/2024 0843 Last data filed at 07/20/2024 0800 Gross per 24 hour  Intake 4288.47 ml  Output 5361 ml  Net -1072.53 ml   Filed Weights   07/18/24 0230 07/19/24 0500 07/20/24 0500  Weight: 75.3 kg 76.3 kg 71.7 kg   Physical Exam: General: Chronically ill-appearing, no acute distress HENT: Katie Waller, AT, OP clear, MMM Neck: Trach in place, c/d/i Eyes: EOMI, no scleral icterus Respiratory: Diminished to auscultation bilaterally.  No crackles, wheezing or rales Cardiovascular: Irregular RR, -M/R/G, no JVD GI: BS+, soft, nontender Extremities: Improved anasarca,-tenderness Neuro: AAO x4, CNII-XII grossly intact  Imaging, labs and test in EMR in the last 24 hours reviewed independently by me. Pertinent findings below:  ABG 7.318/47/126/26 CO2 28, previously in 30s Katie Waller 2.9 Hg 7.7 decreased WBC 11.2 increased  Resolved problem list   Assessment and Plan  Acute metabolic encephalopathy - resolved - Continue dialysis and vent support as noted below  Acute on chronic hypoxic & hypercapnic respiratory failure s/p trach 11/9 Metapneumovirus Fluid overload - baseline weight per daughter around 146-150 lbs BL effusions: s/p thoracentesis 11/8 600 cc transudative fluid Right PTX s/p chest tube: #1 R pigtail CT pulled 11/10; #2 R CT remained; immediate recurring ptx>R pigtail re-placed>both CT to - . No air leak.>CT pulled 11/12.  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 RR previously decreased overnight. Suspect overventilation. Obtain ABG - Full vent support - LTVV, 4-8cc/kg IBW with goal Pplat<30 and DP<15 - Triple nebs - Volume removal via dialysis - Chest tube to suction - 20 cm  H20  Fever - no leukocytosis, off pressors, no clear source. Last fever 11/14 >24 hours - Monitor fever/WBC curve  Shock: Multifactorial in setting of CRRT, sedation. Previously in tension ptx but resolved - Wean levophed for MAP goal 60-65  Afib with RVR, new onset, paroxysmal Chronic HFpEF  HTN CRRT-related hypotension - PO amio - Hold home antihypertensives  RIJ CVL associated blood clot AOC anemia  - Bivalrudin gtt. Unable to reach therapeutic levels on heparin  Anuric AKI Likely 2/2 sepsis/pneumonia and the use of antibiotics  CRRT-related hypotension-resolved - Appreciate Katie Waller input. CRRT 11/11. Continue CRRT over the weekend with potential pause tomorrow - Bicarb gtt - avoid nephrotoxins - Trend BMET  Hypoglycemia - D20 gtt - KUB - Tentatively plan trickle feeds. May need TPN for does not tolerate  Hypokalemia Hypomag - Replete  Ileus - improving - KUB - Tentatively plan trickle feeds. May need TPN for does not tolerate - Bowel regimen  LE weakness and concern for paralysis- -New finding on exam 11/11-able to wiggle toes on exam 11/12 AM-sensory intact to BLE -Nsgy Waller>MRI showed cervical stenosis likely explains BLE weakness> no intervention recommended as thought to be chronic and risk of surgery outweighed benefits    Acute on chronic pain -Scheduled Acetaminophen  1g q8, PRN fentanyl  - Held Methocarbamol 500mg  tid, Gabapentin (home med) 100mg  tid, Oxy 5mg  PRN, Dilaudid 0.5mg  PRN   GOC - Remains guarded prognosis in setting of poor lung mechanics and frailty. Katie Waller concerned about dialysis dependence. Concern for high risk for cardiac event in setting of life support. Discussed QOL with family. - Sister and brother have confirmed to remain full code  on 11/14.    The patient is critically ill with multiple organ systems failure and requires high complexity decision making for assessment and support, frequent evaluation and titration of  therapies, application of advanced monitoring technologies and extensive interpretation of multiple databases.  Independent Critical Care Time: 45 Minutes.   Slater Staff, M.D. [facility] Pulmonary/Critical Care Medicine 07/20/2024 8:44 AM   Please see Amion for pager number to reach on-call Pulmonary and Critical Care Team.

## 2024-07-20 NOTE — Progress Notes (Signed)
 PHARMACY - ANTICOAGULATION CONSULT NOTE  Pharmacy Consult:  Bivalirudin  Indication: atrial fibrillation and internal jugular thombus   Allergies  Allergen Reactions   Ace Inhibitors Swelling   Diphenhydramine Other (See Comments)    Hyperactive/jittery    Doxycycline Hyclate Nausea And Vomiting   Codeine Nausea And Vomiting    Patient Measurements: Height: 5' 1 (154.9 cm) Weight: 71.7 kg (158 lb 1.1 oz) IBW/kg (Calculated) : 47.8 HEPARIN DW (KG): 63.1  Vital Signs: Temp: 98.3 F (36.8 C) (11/15 1602) Temp Source: Oral (11/15 1602) BP: 114/48 (11/15 1502) Pulse Rate: 101 (11/15 1630)  Labs: Recent Labs    07/18/24 0312 07/18/24 1158 07/19/24 0515 07/19/24 1629 07/19/24 2016 07/20/24 0432 07/20/24 1106 07/20/24 1615  HGB 9.8*   < > 8.7*  --  8.8* 7.7* 11.9*  --   HCT 31.3*   < > 27.4*  --  26.0* 24.0* 35.0*  --   PLT 228  --  174  --   --  206  --   --   APTT 76*   < > 75* 65*  --  82*  --  67*  CREATININE 1.62*  1.60*   < > 1.17* 0.91  --  0.83  --   --    < > = values in this interval not displayed.    Estimated Creatinine Clearance: 53.1 mL/min (by C-G formula based on SCr of 0.83 mg/dL).   Medical History: Past Medical History:  Diagnosis Date   Abnormal finding on EKG 09/19/2013   Acute on chronic respiratory failure with hypercapnia (HCC)    Acute respiratory failure (HCC) 06/08/2016   Anxiety 11/09/2020   Arthritis    Asthma    Carotid bruit 11/09/2020   Chronic diastolic CHF (congestive heart failure) (HCC) 07/28/2017   Chronic obstructive pulmonary disease, unspecified (HCC) 11/09/2020   COPD, group D, by GOLD 2017 classification (HCC) 09/17/2013   Cough 11/09/2020   Decreased estrogen level 11/09/2020   Diabetes (HCC)    Dyspnea    Edema 11/09/2020   Essential hypertension 09/18/2013   Goals of care, counseling/discussion    Hardening of the aorta (main artery of the heart) 11/09/2020   Heart disease    History of radiation therapy     Left Lung- 06/20/23-06/26/23- Dr. 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  She was previously on IV heparin and was switched to bivalirudin due to labile heparin levels.  Med was stopped because it was thought that Afib was secondary to critical illness, which resolved. However patient went back into Afib w/ RVR on 11/7.   aPTT remains therapeutic at 67 on current rate.   Goal of Therapy:  aPTT 50-85 seconds Monitor platelets by anticoagulation protocol: Yes   Plan:  Continue bivalrudin to 0.1 mg/kg/hr.  Using weight of 66 kg which is what weight had been used previously while patient was therapeutic  on bival.  aPTT BID.  Daily CBC  Larraine Brazier, PharmD Clinical Pharmacist 07/20/2024  5:40 PM **Pharmacist phone directory can now be found on [email or web address] ([email or web address]).  Listed under [facility].

## 2024-07-20 NOTE — Progress Notes (Signed)
 [facility] NEPHROLOGY PROGRESS NOTE  Assessment/ Plan: Pt is a 78 y.o. 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   # Anuric acute kidney injury with evidence of fluid overload likely due to sepsis/pneumonia and the use of antibiotics. She did not respond with high-dose IV diuretics.  UA and kidney ultrasound unremarkable. -Started CRRT on 11/11 after placement of left IJ temporary HD catheter. -She remains anuric without evidence of renal recovery therefore we will continue CRRT.  - Volume status and electrolytes are improving.  No sign of renal recovery so far.  We will likely be able to hold CRRT in next 1 to 2 days and can try IHD.  I am concerned that she may not be a good candidate for long-term HD. - Continue with strict ins and out and lab monitoring.  Adjust CRRT prescription based on the lab results and clinical response.   # Acute on chronic hypoxic respiratory failure, metapneumovirus pneumonia: Initially treated with broad-spectrum antibiotics which was discontinued later.  Patient required tracheostomy on 11/9 and managing by pulmonary team.   # Bilateral pleural effusion, fluid overload: Patient required chest tube and has been managing by pulmonary team.  # Anemia:  Monitor and transfuse as needed.  # Hypophosphatemia: Repleted  phosphate, follow lab.  Discussed with the patient daughter, ICU team and provider.  Subjective: Seen and examined in ICU.  No significant urine output.  Noted respiratory stress overnight.  Tolerating CRRT well.  Still on Levophed around 5 mics.  Objective Vital signs in last 24 hours: Vitals:   07/20/24 0800 07/20/24  0815 07/20/24 0830 07/20/24 0845  BP:      Pulse: 82 88 92   Resp: (!) 30 (!) 24 (!) 30 (!) 25  Temp:      TempSrc:      SpO2: 100% 100% 99%   Weight:      Height:       Weight change: -4.6 kg  Intake/Output Summary (Last 24 hours) at 07/20/2024 1027 Last data filed at 07/20/2024 1000 Gross per 24 hour  Intake 4182.7 ml  Output 5416 ml  Net -1233.3 ml       Labs: RENAL PANEL Recent Labs  Lab 07/15/24 2059 07/16/24 0200 07/17/24 0458 07/17/24 1631 07/18/24 0312 07/18/24 1158 07/18/24 1613 07/18/24 1956 07/19/24 0312 07/19/24 0515 07/19/24 1629 07/19/24 2016 07/20/24 0432  NA 137   < > 139   < > 136  135   < > 135   < > 132* 134* 135 131* 133*  K 4.6   < > 5.4*   < > 4.8  4.8   < > 4.9   < > 4.1 4.3 4.1 5.7* 4.1  CL 89*   < > 98   < > 99  99  --  99  --   --  96* 96*  --  95*  CO2 31   < > 27   < > 26  25  --  26  --   --  26 26  --  28  GLUCOSE 181*   < > 183*   < > 185*  182*  --  136*  --   --  120*  191*  --  144*  BUN 69*   < > 54*   < > 46*  47*  --  38*  --   --  32* 22  --  16  CREATININE 2.74*   < > 2.03*   < > 1.62*  1.60*  --  1.17*  --   --  1.17* 0.91  --  0.83  CALCIUM  8.5*   < > 8.0*   < > 8.8*  8.7*  --  8.6*  --   --  8.6* 8.3*  --  7.8*  MG 3.4*  --  3.2*  --  2.8*  --   --   --   --  2.4  --   --  2.3  PHOS  --    < > 3.4   < > 3.9  --  3.1  --   --  1.9* 3.4  --  2.9  ALBUMIN  --    < > 2.4*   < > 2.4*  2.5*  --  2.4*  --   --  2.2* 2.2*  --  2.1*   < > = values in this interval not displayed.    Liver Function Tests: Recent Labs  Lab 07/16/24 0200 07/16/24 1623 07/17/24 0458 07/17/24 1631 07/18/24 0312 07/18/24 1613 07/19/24 0515 07/19/24 1629 07/20/24 0432  AST 55*  --  52*  --  47*  --   --   --   --   ALT 29  --  35  --  39  --   --   --   --   ALKPHOS 43  --  47  --  52  --   --   --   --   BILITOT 0.4  --  0.6  --  0.7  --   --   --   --   PROT 5.3*  --  5.5*  --  6.0*  --   --   --   --   ALBUMIN 2.5*   < > 2.4*    < > 2.4*  2.5*   < > 2.2* 2.2* 2.1*   < > = values in this interval not displayed.   No results for input(s): LIPASE, AMYLASE in the last 168 hours.  No results for input(s): AMMONIA in the last 168 hours. CBC: Recent Labs    07/11/24 0228 07/12/24 0841 07/18/24 1956 07/19/24 0312 07/19/24 0515 07/19/24 2016 07/20/24 0432  HGB 8.9*   < > 9.9* 13.3 8.7* 8.8* 7.7*  MCV 100.0   < >  --   --  95.5  --  95.2  VITAMINB12 1,029*  --   --   --   --   --   --   FOLATE >20.0  --   --   --   --   --   --   FERRITIN 762*  --   --   --   --   --   --   TIBC 227*  --   --   --   --   --   --   IRON 44  --   --   --   --   --   --   RETICCTPCT 1.7  --   --   --   --   --   --    < > = values in this interval not displayed.    Cardiac Enzymes: No results for  input(s): CKTOTAL, CKMB, CKMBINDEX, TROPONINI in the last 168 hours. CBG: Recent Labs  Lab 07/19/24 1529 07/19/24 1943 07/19/24 2230 07/20/24 0330 07/20/24 0730  GLUCAP 109* 141* 124* 152* 129*    Iron Studies: No results for input(s): IRON, TIBC, TRANSFERRIN, FERRITIN in the last 72 hours. Studies/Results:   Medications: Infusions:  bivalirudin (ANGIOMAX) 250 mg in sodium chloride  0.9 % 500 mL (0.5 mg/mL) infusion 0.1 mg/kg/hr (07/20/24 1000)   dextrose  15 mL/hr at 07/20/24 1000   fentaNYL  infusion INTRAVENOUS 50 mcg/hr (07/20/24 1000)   norepinephrine (LEVOPHED) Adult infusion 6 mcg/min (07/20/24 1000)   PrismaSol BGK 2/3.5 400 mL/hr at 07/20/24 9386   PrismaSol BGK 2/3.5 400 mL/hr at 07/20/24 9386   prismasol BGK 4/2.5 1,500 mL/hr at 07/20/24 0920   sodium bicarbonate 150 mEq in sterile water 1,150 mL infusion 100 mL/hr at 07/20/24 1000   vasopressin 0.01 Units/min (07/20/24 1000)    Scheduled Medications:  sodium chloride    Intravenous Once   acetaminophen   1,000 mg Per Tube Q8H   amiodarone  200 mg Per Tube Daily   arformoterol   15 mcg Nebulization BID   B-complex with vitamin C  1 tablet  Per Tube Daily   budesonide  (PULMICORT ) nebulizer solution  0.5 mg Nebulization BID   Chlorhexidine  Gluconate Cloth  6 each Topical Daily   fentaNYL  (SUBLIMAZE ) injection  25-50 mcg Intravenous Once   insulin aspart  0-9 Units Subcutaneous Q4H   ipratropium-albuterol   3 mL Nebulization Q6H   lidocaine -EPINEPHrine  20 mL Intradermal Once   nutrition supplement (JUVEN)  1 packet Per Tube BID BM   mouth rinse  15 mL Mouth Rinse Q2H   pantoprazole (PROTONIX) IV  40 mg Intravenous Q24H   polyethylene glycol  17 g Per Tube Daily   sodium chloride  flush  10 mL Intrapleural Q8H   sodium chloride  flush  10-40 mL Intracatheter Q12H    have reviewed scheduled and prn medications.  Physical Exam: General: Ill looking female, trach on vent, opens eyes with the name. Heart:RRR, s1s2 nl Lungs: Coarse breath sound bilateral. Abdomen:soft, Non-tender, non-distended Extremities: Trace peripheral dependent edema present Dialysis Access: Left temporary HD catheter  Flornce Record Prasad Dillyn Joaquin 07/20/2024,10:27 AM  LOS: 25 days

## 2024-07-20 NOTE — Progress Notes (Signed)
 Pharmacy Electrolyte Replacement  Recent Labs:  Recent Labs    07/20/24 0432 07/20/24 1106  K 4.1 4.0  MG 2.3  --   PHOS 2.9  --   CREATININE 0.83  --     Low Critical Values (K </= 2.5, Phos </= 1, Mg </= 1) Present: None  MD Contacted: n/a   Plan: K Phos neutral 500 mg q4h x 4 doses per tube per protocol   Rankin Sams, PharmD, BCPS, BCCCP Clinical Pharmacist

## 2024-07-20 NOTE — Progress Notes (Signed)
 PHARMACY - ANTICOAGULATION CONSULT NOTE  Pharmacy Consult:  Bivalirudin  Indication: atrial fibrillation and internal jugular thombus   Allergies  Allergen Reactions   Ace Inhibitors Swelling   Diphenhydramine Other (See Comments)    Hyperactive/jittery    Doxycycline Hyclate Nausea And Vomiting   Codeine Nausea And Vomiting    Patient Measurements: Height: 5' 1 (154.9 cm) Weight: 71.7 kg (158 lb 1.1 oz) IBW/kg (Calculated) : 47.8 HEPARIN DW (KG): 63.1  Vital Signs: Temp: 97.9 F (36.6 C) (11/15 0318) Temp Source: Axillary (11/15 0318) Pulse Rate: 85 (11/15 0645)  Labs: Recent Labs    07/18/24 0312 07/18/24 1158 07/19/24 0515 07/19/24 1629 07/19/24 2016 07/20/24 0432  HGB 9.8*   < > 8.7*  --  8.8* 7.7*  HCT 31.3*   < > 27.4*  --  26.0* 24.0*  PLT 228  --  174  --   --  206  APTT 76*   < > 75* 65*  --  82*  CREATININE 1.62*  1.60*   < > 1.17* 0.91  --  0.83   < > = values in this interval not displayed.    Estimated Creatinine Clearance: 53.1 mL/min (by C-G formula based on SCr of 0.83 mg/dL).   Medical History: Past Medical History:  Diagnosis Date   Abnormal finding on EKG 09/19/2013   Acute on chronic respiratory failure with hypercapnia (HCC)    Acute respiratory failure (HCC) 06/08/2016   Anxiety 11/09/2020   Arthritis    Asthma    Carotid bruit 11/09/2020   Chronic diastolic CHF (congestive heart failure) (HCC) 07/28/2017   Chronic obstructive pulmonary disease, unspecified (HCC) 11/09/2020   COPD, group D, by GOLD 2017 classification (HCC) 09/17/2013   Cough 11/09/2020   Decreased estrogen level 11/09/2020   Diabetes (HCC)    Dyspnea    Edema 11/09/2020   Essential hypertension 09/18/2013   Goals of care, counseling/discussion    Hardening of the aorta (main artery of the heart) 11/09/2020   Heart disease    History of radiation therapy    Left Lung- 06/20/23-06/26/23- Dr. 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  She was previously on IV heparin and was switched to bivalirudin due to labile heparin levels.  Med was stopped because it was thought that Afib was secondary to critical illness, which resolved. However patient went back into Afib w/ RVR on 11/7.   aPTT remains therapeutic at 82 on current rate. Hb 7.7, plt 206.   Goal of Therapy:  aPTT 50-85 seconds Monitor platelets by anticoagulation protocol: Yes   Plan:  Continue bivalrudin to 0.1 mg/kg/hr.  Using weight of 66 kg which is what weight had been used previously while patient was therapeutic on bival.  aPTT BID.  Daily CBC  Rankin Sams, PharmD, BCPS, BCCCP Clinical Pharmacist

## 2024-07-21 DIAGNOSIS — J449 Chronic obstructive pulmonary disease, unspecified: Secondary | ICD-10-CM | POA: Diagnosis not present

## 2024-07-21 DIAGNOSIS — B9781 Human metapneumovirus as the cause of diseases classified elsewhere: Secondary | ICD-10-CM | POA: Diagnosis not present

## 2024-07-21 DIAGNOSIS — G929 Unspecified toxic encephalopathy: Secondary | ICD-10-CM | POA: Diagnosis not present

## 2024-07-21 DIAGNOSIS — J9620 Acute and chronic respiratory failure, unspecified whether with hypoxia or hypercapnia: Secondary | ICD-10-CM | POA: Diagnosis not present

## 2024-07-21 LAB — RENAL FUNCTION PANEL
Albumin: 2.1 g/dL — ABNORMAL LOW (ref 3.5–5.0)
Albumin: 2 g/dL — ABNORMAL LOW (ref 3.5–5.0)
Anion gap: 12 (ref 5–15)
Anion gap: 11 (ref 5–15)
BUN: 15 mg/dL (ref 8–23)
BUN: 21 mg/dL (ref 8–23)
CO2: 29 mmol/L (ref 22–32)
CO2: 28 mmol/L (ref 22–32)
Calcium: 8 mg/dL — ABNORMAL LOW (ref 8.9–10.3)
Calcium: 8 mg/dL — ABNORMAL LOW (ref 8.9–10.3)
Chloride: 95 mmol/L — ABNORMAL LOW (ref 98–111)
Chloride: 97 mmol/L — ABNORMAL LOW (ref 98–111)
Creatinine, Ser: 0.72 mg/dL (ref 0.44–1.00)
Creatinine, Ser: 0.79 mg/dL (ref 0.44–1.00)
GFR, Estimated: >60 mL/min (ref >60)
GFR, Estimated: >60 mL/min (ref >60)
Glucose, Bld: 167 mg/dL — ABNORMAL HIGH (ref 70–99)
Glucose, Bld: 187 mg/dL — ABNORMAL HIGH (ref 70–99)
Phosphorus: 3.5 mg/dL (ref 2.5–4.6)
Phosphorus: 2.7 mg/dL (ref 2.5–4.6)
Potassium: 3.8 mmol/L (ref 3.5–5.1)
Potassium: 4.4 mmol/L (ref 3.5–5.1)
Sodium: 136 mmol/L (ref 135–145)
Sodium: 136 mmol/L (ref 135–145)

## 2024-07-21 LAB — CBC
HCT: 24.3 % — ABNORMAL LOW (ref 36.0–46.0)
Hemoglobin: 7.7 g/dL — ABNORMAL LOW (ref 12.0–15.0)
MCH: 30.2 pg (ref 26.0–34.0)
MCHC: 31.7 g/dL (ref 30.0–36.0)
MCV: 95.3 fL (ref 80.0–100.0)
Platelets: 205 K/uL (ref 150–400)
RBC: 2.55 MIL/uL — ABNORMAL LOW (ref 3.87–5.11)
RDW: 15.5 % (ref 11.5–15.5)
WBC: 17.3 K/uL — ABNORMAL HIGH (ref 4.0–10.5)
nRBC: 25.4 % — ABNORMAL HIGH (ref 0.0–0.2)

## 2024-07-21 LAB — POCT I-STAT 7, (LYTES, BLD GAS, ICA,H+H)
Acid-Base Excess: 7 mmol/L — ABNORMAL HIGH (ref 0.0–2.0)
Bicarbonate: 32.4 mmol/L — ABNORMAL HIGH (ref 20.0–28.0)
Calcium, Ion: 1.1 mmol/L — ABNORMAL LOW (ref 1.15–1.40)
HCT: 21 % — ABNORMAL LOW (ref 36.0–46.0)
Hemoglobin: 7.1 g/dL — ABNORMAL LOW (ref 12.0–15.0)
O2 Saturation: 98 %
Patient temperature: 97.7
Potassium: 4 mmol/L (ref 3.5–5.1)
Sodium: 136 mmol/L (ref 135–145)
TCO2: 34 mmol/L — ABNORMAL HIGH (ref 22–32)
pCO2 arterial: 52.9 mmHg — ABNORMAL HIGH (ref 32–48)
pH, Arterial: 7.394 (ref 7.35–7.45)
pO2, Arterial: 100 mmHg (ref 83–108)

## 2024-07-21 LAB — GLUCOSE, CAPILLARY
Glucose-Capillary: 130 mg/dL — ABNORMAL HIGH (ref 70–99)
Glucose-Capillary: 147 mg/dL — ABNORMAL HIGH (ref 70–99)
Glucose-Capillary: 165 mg/dL — ABNORMAL HIGH (ref 70–99)
Glucose-Capillary: 178 mg/dL — ABNORMAL HIGH (ref 70–99)
Glucose-Capillary: 189 mg/dL — ABNORMAL HIGH (ref 70–99)
Glucose-Capillary: 210 mg/dL — ABNORMAL HIGH (ref 70–99)

## 2024-07-21 LAB — APTT
aPTT: 66 s — ABNORMAL HIGH (ref 24–36)
aPTT: 69 s — ABNORMAL HIGH (ref 24–36)

## 2024-07-21 LAB — MAGNESIUM: Magnesium: 2.3 mg/dL (ref 1.7–2.4)

## 2024-07-21 MED ORDER — NEOSTIGMINE METHYLSULFATE 10 MG/10ML IV SOLN
0.2500 mg | Freq: Four times a day (QID) | INTRAVENOUS | Status: DC
  Administered 2024-07-21 – 2024-07-22 (×4): 0.25 mg via SUBCUTANEOUS
  Filled 2024-07-21 (×5): qty 0.25

## 2024-07-21 MED ORDER — K PHOS MONO-SOD PHOS DI & MONO 155-852-130 MG PO TABS
500.0000 mg | ORAL_TABLET | ORAL | Status: AC
  Administered 2024-07-21 – 2024-07-22 (×4): 500 mg
  Filled 2024-07-21 (×3): qty 2

## 2024-07-21 MED ORDER — METHYLNALTREXONE BROMIDE 12 MG/0.6ML ~~LOC~~ SOLN
12.0000 mg | Freq: Once | SUBCUTANEOUS | Status: AC
  Administered 2024-07-21: 12 mg via SUBCUTANEOUS
  Filled 2024-07-21: qty 0.6

## 2024-07-21 MED ORDER — PRISMASOL BGK 4/2.5 32-4-2.5 MEQ/L EC SOLN: Status: DC

## 2024-07-21 MED ORDER — IPRATROPIUM-ALBUTEROL 0.5-2.5 (3) MG/3ML IN SOLN
3.0000 mL | Freq: Four times a day (QID) | RESPIRATORY_TRACT | Status: DC | PRN
  Administered 2024-07-30 (×2): 3 mL via RESPIRATORY_TRACT
  Filled 2024-07-21 (×2): qty 3

## 2024-07-21 MED ORDER — METOCLOPRAMIDE HCL 5 MG/ML IJ SOLN
5.0000 mg | Freq: Two times a day (BID) | INTRAMUSCULAR | Status: DC
  Administered 2024-07-21 – 2024-07-22 (×2): 5 mg via INTRAVENOUS
  Filled 2024-07-21 (×2): qty 2

## 2024-07-21 MED ORDER — AMIODARONE HCL IN DEXTROSE 360-4.14 MG/200ML-% IV SOLN
30.0000 mg/h | INTRAVENOUS | Status: DC
  Administered 2024-07-22 – 2024-07-30 (×18): 30 mg/h via INTRAVENOUS
  Filled 2024-07-21 (×17): qty 200

## 2024-07-21 MED ORDER — OXYCODONE HCL 5 MG PO TABS
5.0000 mg | ORAL_TABLET | Freq: Three times a day (TID) | ORAL | Status: DC
  Administered 2024-07-21 – 2024-07-22 (×3): 5 mg
  Filled 2024-07-21 (×3): qty 1

## 2024-07-21 MED ORDER — AMIODARONE HCL IN DEXTROSE 360-4.14 MG/200ML-% IV SOLN
60.0000 mg/h | INTRAVENOUS | Status: AC
  Administered 2024-07-21: 60 mg/h via INTRAVENOUS
  Filled 2024-07-21: qty 200

## 2024-07-21 MED ORDER — PIPERACILLIN-TAZOBACTAM 3.375 G IVPB 30 MIN
3.3750 g | Freq: Four times a day (QID) | INTRAVENOUS | Status: DC
  Administered 2024-07-21 – 2024-07-22 (×5): 3.375 g via INTRAVENOUS
  Filled 2024-07-21 (×9): qty 50

## 2024-07-21 NOTE — Progress Notes (Signed)
 PCCM Interval Note   Increased levophed requirement. Discussed with nephrology and ok to hold CRRT tonight. Communicated with bedside RN.

## 2024-07-21 NOTE — Progress Notes (Signed)
 PHARMACY - ANTICOAGULATION CONSULT NOTE  Pharmacy Consult:  Bivalirudin  Indication: atrial fibrillation and internal jugular thombus   Allergies  Allergen Reactions   Ace Inhibitors Swelling   Diphenhydramine Other (See Comments)    Hyperactive/jittery    Doxycycline Hyclate Nausea And Vomiting   Codeine Nausea And Vomiting    Patient Measurements: Height: 5' 1 (154.9 cm) Weight: 71.7 kg (158 lb 1.1 oz) IBW/kg (Calculated) : 47.8 HEPARIN DW (KG): 63.1  Vital Signs: Temp: 97.7 F (36.5 C) (11/16 1515) Temp Source: Axillary (11/16 1515) BP: 110/49 (11/16 1336) Pulse Rate: 102 (11/16 1600)  Labs: Recent Labs    07/19/24 0515 07/19/24 1629 07/20/24 0432 07/20/24 1106 07/20/24 1615 07/21/24 0427 07/21/24 1513 07/21/24 1559  HGB 8.7*   < > 7.7* 11.9*  --  7.7* 7.1*  --   HCT 27.4*   < > 24.0* 35.0*  --  24.3* 21.0*  --   PLT 174  --  206  --   --  205  --   --   APTT 75*   < > 82*  --  67* 66*  --  69*  CREATININE 1.17*   < > 0.83  --  0.77 0.72  --   --    < > = values in this interval not displayed.    Estimated Creatinine Clearance: 55.1 mL/min (by C-G formula based on SCr of 0.72 mg/dL).   Medical History: Past Medical History:  Diagnosis Date   Abnormal finding on EKG 09/19/2013   Acute on chronic respiratory failure with hypercapnia (HCC)    Acute respiratory failure (HCC) 06/08/2016   Anxiety 11/09/2020   Arthritis    Asthma    Carotid bruit 11/09/2020   Chronic diastolic CHF (congestive heart failure) (HCC) 07/28/2017   Chronic obstructive pulmonary disease, unspecified (HCC) 11/09/2020   COPD, group D, by GOLD 2017 classification (HCC) 09/17/2013   Cough 11/09/2020   Decreased estrogen level 11/09/2020   Diabetes (HCC)    Dyspnea    Edema 11/09/2020   Essential hypertension 09/18/2013   Goals of care, counseling/discussion    Hardening of the aorta (main artery of the heart) 11/09/2020   Heart disease    History of radiation therapy    Left  Lung- 06/20/23-06/26/23- Dr. 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  She was previously on IV heparin and was switched to bivalirudin due to labile heparin levels.  Med was stopped because it was thought that Afib was secondary to critical illness, which resolved. However patient went back into Afib w/ RVR on 11/7.   aPTT remains therapeutic at 69 on current rate.   Goal of Therapy:  aPTT 50-85 seconds Monitor platelets by anticoagulation protocol: Yes   Plan:  Continue bivalrudin to 0.1 mg/kg/hr.  Using weight of 66 kg which is what weight had been used previously while patient was therapeutic on bival.  aPTT BID.  Daily CBC  Rocky Slade, PharmD, BCPS Clinical Pharmacist 07/21/2024 4:37 PM

## 2024-07-21 NOTE — Progress Notes (Signed)
 Pharmacy Electrolyte Replacement  Recent Labs:  Recent Labs    07/21/24 0427 07/21/24 1513 07/21/24 1559  K 3.8   < > 4.4  MG 2.3  --   --   PHOS 3.5  --  2.7  CREATININE 0.72  --  0.79   < > = values in this interval not displayed.    Low Critical Values (K </= 2.5, Phos </= 1, Mg </= 1) Present: None  MD Contacted: n/a   Plan: K Phos neutral 500 mg q4h x 4 doses per tube per protocol   Rocky Slade, PharmD, BCPS Clinical Pharmacist

## 2024-07-21 NOTE — Progress Notes (Signed)
 eLink Physician-Brief Progress Note Patient Name: SHIFRA SWARTZENTRUBER DOB: 02-Mar-1946 MRN: [id number]   Date of Service  07/21/2024  HPI/Events of Note  Patient has escalating pressor requirements, holding CRRT tonight.  4 PM labs show serum bicarb now 28.  Question about potentially stopping the bicarb infusion.  eICU Interventions  Would maintain the bicarb infusion for the night as CRRT is being held, can likely be discontinued with resumption of RRT.  Given escalation in vasopressors, restart vasopressin.  Radiograph is ordered for the morning-if recurrence of pneumothorax present, will have to triage the tube   0221 additional vaso, now almost titrated off of nor epi.  Titrate off the norepinephrine and then I will make vasopressin titratable and try to work off all pressors  0356 -hemoglobin 6.6 this morning, repeat type and screen and 1 unit PRBC  Intervention Category Intermediate Interventions: Hypotension - evaluation and management  Kerryann Allaire 07/21/2024, 10:41 PM

## 2024-07-21 NOTE — Progress Notes (Signed)
 Trach sutures x2 removed at this time per protocol without complication. Trach care provided to patient and new split gauze placed.

## 2024-07-21 NOTE — Progress Notes (Addendum)
 [facility] NEPHROLOGY PROGRESS NOTE  Assessment/ Plan: Pt is a 78 y.o. 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   # Anuric acute kidney injury with evidence of fluid overload likely due to sepsis/pneumonia and the use of antibiotics. She did not respond with high-dose IV diuretics.  UA and kidney ultrasound unremarkable. -Started CRRT on 11/11 after placement of left IJ temporary HD catheter. -She remains anuric without evidence of renal recovery, the pressor requirement is going up today.  I will lower UF goal to 50 cc an hour.  Hopefully this will help lower pressor requirement. Changed to all 4k bath.  Possibly hold CRRT tomorrow and may try IHD.  I am concerned that she may not be a good candidate for long-term HD. - Continue strict ins and out and lab monitoring.  Adjust CRRT prescription based on the lab results and clinical response.   # Acute on chronic hypoxic respiratory failure, metapneumovirus pneumonia: Initially treated with broad-spectrum antibiotics which was discontinued later.  Patient required tracheostomy on 11/9 and managing by pulmonary team.   # Bilateral pleural effusion, fluid overload: Patient required chest tube and has been managing by pulmonary team.  # Anemia:  Monitor and transfuse as needed.  # Hypophosphatemia: Repleted  phosphate, follow lab.  Discussed with the patient's daughter, ICU nurse and provider.  Subjective: Seen and examined in ICU.  No urine output.  The requirement of pressure going up.  Currently UF around 100 cc an hour.  No other event overnight.  Daughter at the bedside.  Objective Vital signs in last 24  hours: Vitals:   07/21/24 0845 07/21/24 0900 07/21/24 0903 07/21/24 0915  BP:      Pulse: 97 98 86 99  Resp: 20 (!) 27 (!) 24 (!) 25  Temp:      TempSrc:      SpO2: 100% 99% 100% 100%  Weight:      Height:       Weight change:   Intake/Output Summary (Last 24 hours) at 07/21/2024 0928 Last data filed at 07/21/2024 9096 Gross per 24 hour  Intake 4266.89 ml  Output 5896.7 ml  Net -1629.81 ml       Labs: RENAL PANEL Recent Labs  Lab 07/17/24 0458 07/17/24 1631 07/18/24 0312 07/18/24 1158 07/19/24 0515 07/19/24 1629 07/19/24 2016 07/20/24 0432 07/20/24 1106 07/20/24 1615 07/21/24 0427  NA 139   < > 136  135   < > 134* 135 131* 133* 135 137 136  K 5.4*   < > 4.8  4.8   < > 4.3 4.1 5.7* 4.1 4.0 3.8 3.8  CL 98   < > 99  99   < > 96* 96*  --  95*  --  98 95*  CO2 27   < > 26  25   < > 26 26  --  28  --  28 29  GLUCOSE 183*   < > 185*  182*   < > 120* 191*  --  144*  --  117* 167*  BUN 54*   < > 46*  47*   < > 32* 22  --  16  --  15 15  CREATININE 2.03*   < >  1.62*  1.60*   < > 1.17* 0.91  --  0.83  --  0.77 0.72  CALCIUM  8.0*   < > 8.8*  8.7*   < > 8.6* 8.3*  --  7.8*  --  7.9* 8.0*  MG 3.2*  --  2.8*  --  2.4  --   --  2.3  --   --  2.3  PHOS 3.4   < > 3.9   < > 1.9* 3.4  --  2.9  --  2.7 3.5  ALBUMIN 2.4*   < > 2.4*  2.5*   < > 2.2* 2.2*  --  2.1*  --  2.0* 2.1*   < > = values in this interval not displayed.    Liver Function Tests: Recent Labs  Lab 07/16/24 0200 07/16/24 1623 07/17/24 0458 07/17/24 1631 07/18/24 0312 07/18/24 1613 07/20/24 0432 07/20/24 1615 07/21/24 0427  AST 55*  --  52*  --  47*  --   --   --   --   ALT 29  --  35  --  39  --   --   --   --   ALKPHOS 43  --  47  --  52  --   --   --   --   BILITOT 0.4  --  0.6  --  0.7  --   --   --   --   PROT 5.3*  --  5.5*  --  6.0*  --   --   --   --   ALBUMIN 2.5*   < > 2.4*   < > 2.4*  2.5*   < > 2.1* 2.0* 2.1*   < > = values in this interval not displayed.   No results for  input(s): LIPASE, AMYLASE in the last 168 hours.  No results for input(s): AMMONIA in the last 168 hours. CBC: Recent Labs    07/11/24 0228 07/12/24 0841 07/19/24 0515 07/19/24 2016 07/20/24 0432 07/20/24 1106 07/21/24 0427  HGB 8.9*   < > 8.7* 8.8* 7.7* 11.9* 7.7*  MCV 100.0   < > 95.5  --  95.2  --  95.3  VITAMINB12 1,029*  --   --   --   --   --   --   FOLATE >20.0  --   --   --   --   --   --   FERRITIN 762*  --   --   --   --   --   --   TIBC 227*  --   --   --   --   --   --   IRON 44  --   --   --   --   --   --   RETICCTPCT 1.7  --   --   --   --   --   --    < > = values in this interval not displayed.    Cardiac Enzymes: No results for input(s): CKTOTAL, CKMB, CKMBINDEX, TROPONINI in the last 168 hours. CBG: Recent Labs  Lab 07/20/24 1538 07/20/24 1941 07/20/24 2335 07/21/24 0347 07/21/24 0728  GLUCAP 82 133* 163* 147* 130*    Iron Studies: No results for input(s): IRON, TIBC, TRANSFERRIN, FERRITIN in the last 72 hours. Studies/Results:   Medications: Infusions:  bivalirudin (ANGIOMAX) 250 mg in sodium chloride  0.9 % 500 mL (0.5 mg/mL) infusion 0.1 mg/kg/hr (07/21/24 0900)   dextrose  15 mL/hr  at 07/21/24 0900   feeding supplement (VITAL 1.5 CAL) 20 mL/hr at 07/21/24 0900   fentaNYL  infusion INTRAVENOUS 25 mcg/hr (07/21/24 0900)   norepinephrine (LEVOPHED) Adult infusion 8 mcg/min (07/21/24 0900)   piperacillin-tazobactam     PrismaSol BGK 2/3.5 400 mL/hr at 07/21/24 9287   PrismaSol BGK 2/3.5 400 mL/hr at 07/21/24 0711   prismasol BGK 4/2.5 1,500 mL/hr at 07/21/24 0902   sodium bicarbonate 150 mEq in sterile water 1,150 mL infusion 100 mL/hr at 07/21/24 0900   vasopressin Stopped (07/20/24 1250)    Scheduled Medications:  sodium chloride    Intravenous Once   acetaminophen   1,000 mg Per Tube Q8H   amiodarone  200 mg Per Tube Daily   arformoterol   15 mcg Nebulization BID   B-complex with vitamin C  1 tablet Per Tube Daily    budesonide  (PULMICORT ) nebulizer solution  0.5 mg Nebulization BID   Chlorhexidine  Gluconate Cloth  6 each Topical Daily   fentaNYL  (SUBLIMAZE ) injection  25-50 mcg Intravenous Once   insulin aspart  0-9 Units Subcutaneous Q4H   ipratropium-albuterol   3 mL Nebulization Q6H   lidocaine -EPINEPHrine  20 mL Intradermal Once   nutrition supplement (JUVEN)  1 packet Per Tube BID BM   mouth rinse  15 mL Mouth Rinse Q2H   pantoprazole (PROTONIX) IV  40 mg Intravenous Q24H   polyethylene glycol  17 g Per Tube Daily   sodium chloride  flush  10 mL Intrapleural Q8H   sodium chloride  flush  10-40 mL Intracatheter Q12H    have reviewed scheduled and prn medications.  Physical Exam: General: Ill looking female, trach on vent, opens eyes with the name. Heart:RRR, s1s2 nl Lungs: Coarse breath sound bilateral. Abdomen:soft, Non-tender, non-distended Extremities: Trace peripheral dependent edema present Dialysis Access: Left temporary HD catheter  Cena Bruhn Prasad Chiante Peden 07/21/2024,9:28 AM  LOS: 26 days

## 2024-07-21 NOTE — Progress Notes (Signed)
 PHARMACY - ANTICOAGULATION CONSULT NOTE  Pharmacy Consult:  Bivalirudin  Indication: atrial fibrillation and internal jugular thombus   Allergies  Allergen Reactions   Ace Inhibitors Swelling   Diphenhydramine Other (See Comments)    Hyperactive/jittery    Doxycycline Hyclate Nausea And Vomiting   Codeine Nausea And Vomiting    Patient Measurements: Height: 5' 1 (154.9 cm) Weight: 71.7 kg (158 lb 1.1 oz) IBW/kg (Calculated) : 47.8 HEPARIN DW (KG): 63.1  Vital Signs: Temp: 97.8 F (36.6 C) (11/16 0345) Temp Source: Axillary (11/16 0345) Pulse Rate: 99 (11/16 0645)  Labs: Recent Labs    07/19/24 0515 07/19/24 1629 07/20/24 0432 07/20/24 1106 07/20/24 1615 07/21/24 0427  HGB 8.7*   < > 7.7* 11.9*  --  7.7*  HCT 27.4*   < > 24.0* 35.0*  --  24.3*  PLT 174  --  206  --   --  205  APTT 75*   < > 82*  --  67* 66*  CREATININE 1.17*   < > 0.83  --  0.77 0.72   < > = values in this interval not displayed.    Estimated Creatinine Clearance: 55.1 mL/min (by C-G formula based on SCr of 0.72 mg/dL).   Medical History: Past Medical History:  Diagnosis Date   Abnormal finding on EKG 09/19/2013   Acute on chronic respiratory failure with hypercapnia (HCC)    Acute respiratory failure (HCC) 06/08/2016   Anxiety 11/09/2020   Arthritis    Asthma    Carotid bruit 11/09/2020   Chronic diastolic CHF (congestive heart failure) (HCC) 07/28/2017   Chronic obstructive pulmonary disease, unspecified (HCC) 11/09/2020   COPD, group D, by GOLD 2017 classification (HCC) 09/17/2013   Cough 11/09/2020   Decreased estrogen level 11/09/2020   Diabetes (HCC)    Dyspnea    Edema 11/09/2020   Essential hypertension 09/18/2013   Goals of care, counseling/discussion    Hardening of the aorta (main artery of the heart) 11/09/2020   Heart disease    History of radiation therapy    Left Lung- 06/20/23-06/26/23- Dr. Lynwood Nasuti   Hypertension    Hypertensive heart failure (HCC) 11/09/2020    Hypoxia 11/09/2020   Insomnia 11/09/2020   Iron deficiency anemia 11/09/2020   Large liver 11/09/2020   Near syncope 09/17/2013   Osteoporosis 11/09/2020   Palliative care encounter    Prediabetes 09/19/2013   Pure hypercholesterolemia 11/09/2020   Raynaud's disease 11/09/2020   Sinus tachycardia 09/18/2013   Skin sensation disturbance 11/09/2020   Smoker 09/17/2013   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   Transient ischemic attack 11/09/2020   Venous stasis of both lower extremities 06/13/2016     Assessment: 77 YOF with recurrent Afib to resume bivalirudin.  She was previously on IV heparin and was switched to bivalirudin due to labile heparin levels.  Med was stopped because it was thought that Afib was secondary to critical illness, which resolved. However patient went back into Afib w/ RVR on 11/7.   aPTT remains therapeutic at 66 on current rate.   Goal of Therapy:  aPTT 50-85 seconds Monitor platelets by anticoagulation protocol: Yes   Plan:  Continue bivalrudin to 0.1 mg/kg/hr.  Using weight of 66 kg which is what weight had been used previously while patient was therapeutic on bival.  aPTT BID.  Daily CBC  Rankin Sams, PharmD, BCPS, BCCCP Clinical Pharmacist

## 2024-07-21 NOTE — Progress Notes (Signed)
 NAMELUPIE Waller, MRN:  [id number], DOB:  06/27/1946, LOS: 26 ADMISSION DATE:  06/25/2024, CONSULTATION DATE:  06/25/2024 REFERRING MD:  Theodoro SAUNDERS, CHIEF COMPLAINT:  AMS, Sepsis, AR  History of Present Illness:  Katie Waller is a 78 yo female with past medical history significant for HTN, COPD, acute on chronic hypoxic, hypercapnic respiratory failure on 5-6L [place] baseline, diastolic heart failure, SCC Left lower lobe s/p radiation in September 2025, who presented via EMS after being found unresponsive at home. Code stroke called. On arrival to ED patient emergently intubated for acute respiratory distress and airway protection. CT Head with no ICH or large infarct, CTA head and neck with no LVO. CXR concerning for a Pneumonia with visible BL lung apices on CTA demonstrating infection/inflammation. Code stroke cancelled with presentation suspicious for sepsis. Patient received 30ml/kg crystalloid, pan cultured, and started empirically on Cefepime/Vanc/Azithromycin . Of note, family reports patient has recently not felt well and likely has had a cold. PCCM consulted for ICU admission.   On exam in ED, patient febrile to >101 and hypotensive, likely multifactorial in setting of presumed sepsis and recent sedation on induction with underlying heart disease. Norepinephrine gtt was initiated for MAPs < 65 and patient was transferred to ICU with plans to gain central access for vasopressor administration and arterial line placement for hemodynamic monitoring.  Pertinent  Medical History  Per above  Significant Hospital Events: Including procedures, antibiotic start and stop dates in addition to other pertinent events   10/21 Found unresponsive at home->ED>Code stroke>intubated for ARD/airway protection>CTH/CTA negative>Code stroke cancelled>Hypotensive>started on pressors>Admit to ICU  Arterial line placed 10/21 10/22 and 10/23 unsuccessful extubation 10/24: Despite maximal Precedex and fentanyl   pushes, unable to safely and comfortably clean the patient up so added prn Versed and started fentanyl  infusion. Rectal tube added for diarrhea.  10/24: 2/3 BC resulted in staph capitis/epidermitis, extubated on BiPAP 10/25: patient remained on BiPAP for 24 hours, when she came off of BiPAP, could not tolerate became tachypneic, tachycardic went into respiratory distress requiring endotracheal reintubation 10/29: unsuccessful SBT, went briefly back into RVR 10/30: unsuccessful weaning from sedation as BP/HR increased considerably 11/2: extubated to HFNC 11/8: progressively became more hypoxic with decreased respiratory drive prompting ABG this a.m. which revealed toxic hypercapnic respiratory failure.  Transferred back to ICU for impending intubation; tried draining pleural spaces but still tired out 11/10: trach 11/10: aggressive diuresis with Lasix  80 followed by 160 followed by 260 + metolazone and bicarb - no response>Nephrology consulted 11/11: nephro consult. LE weakness noted on exam with inability to move - neuro consulted, MRI C and T spine ordered. CT pulled yesterday, worsening ptx>2 R CT placed, ptx improved. CRRT started. Hypotensive>briefly on Levo 11/12: Levo stopped. MRI C spine>cervical stenosis 11/14: Worsening hypotension in setting of tension pneumothorax. Resolved after chest tube was flushed with improved pressor requirement. Levophed now 5 mcg/min  Interim History / Subjective:  Remains on levophed 8  Minimal vent settings. Failed SBT due to fatigue, tachypnea and low tidal volumes  Minimal chest tube output  Net negative 2.9 in last 24 hours  Last fever >48 hours  Objective    Blood pressure (!) 114/48, pulse 98, temperature 98.5 F (36.9 C), temperature source Axillary, resp. rate (!) 27, height 5' 1 (1.549 m), weight 71.7 kg, SpO2 99%. CVP:  [6 mmHg-12 mmHg] 6 mmHg  Vent Mode: PRVC FiO2 (%):  [40 %] 40 % Set Rate:  [25 bmp-30 bmp] 25 bmp Vt Set:  [619  mL]  380 mL PEEP:  [5 cmH20] 5 cmH20 Plateau Pressure:  [14 cmH20-18 cmH20] 15 cmH20   Intake/Output Summary (Last 24 hours) at 07/21/2024 0916 Last data filed at 07/21/2024 0900 Gross per 24 hour  Intake 4166.89 ml  Output 5896.7 ml  Net -1729.81 ml   Filed Weights   07/18/24 0230 07/19/24 0500 07/20/24 0500  Weight: 75.3 kg 76.3 kg 71.7 kg    Physical Exam: General: Chronically ill-appearing, no acute distress HENT: [place], AT, OP clear, MMM Neck: Trach in place, c/d/i Eyes: EOMI, no scleral icterus Respiratory: Diminished to auscultation bilaterally.  No crackles, wheezing or rales. Right chest tube in place Cardiovascular: Irregular rate and rhythm, -M/R/G, no JVD GI: BS+, soft, nontender Extremities:-Edema,-tenderness Neuro: Opens eyes, intermittently follows commands, CNII-XII grossly intact  Imaging, labs and test in EMR in the last 24 hours reviewed independently by me. Pertinent findings below: ABG 7.437/41/116, chronic hypercarbia has been overcorrected CO2 29, previously in 30s Phos 3.5 Hg 7.7 stable WBC 17.3 increased  Resolved problem list   Assessment and Plan  Acute metabolic encephalopathy - resolved - Continue dialysis and vent support as noted below  Acute on chronic hypoxic & hypercapnic respiratory failure s/p trach 11/9 Metapneumovirus Fluid overload - baseline weight per daughter around 146-150 lbs BL effusions: s/p thoracentesis 11/8 600 cc transudative fluid Right PTX s/p chest tube: #1 R pigtail CT pulled 11/10; #2 R CT remained; immediate recurring ptx>R pigtail re-placed>both CT to - . No air leak.>CT pulled 11/12.  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 ABG c/w overventilation. Reduce respiratory rate. May need serum bicarb to reach baseline  - Full vent support - LTVV, 4-8cc/kg IBW with goal Pplat<30 and DP<15 - Triple nebs - Volume removal via dialysis -  Chest tube to suction - 20 cm H20  Fever - Last fever >48 hours, worsening leukocytosis but no clear evidence of source Shock - Multifactorial in setting of CRRT, sedation. Previously in tension ptx but resolved - Monitor fever/WBC - Will empirically start antibiotics for possible aspiration - Wean levophed for MAP >60-65. Nephrology planning to reduce UF today - Minimize fentanyl  if able. PO oxycodone x 48 hours but will need to monitor for ileus  Afib with RVR, new onset, paroxysmal Chronic HFpEF  HTN CRRT-related hypotension - PO Amio - Hold home antihypertensives  RIJ CVL associated blood clot AOC anemia  - Bivalirudin gtt. Unable to reach therapeutic levels on heparin  Anuric AKI Likely 2/2 sepsis/pneumonia and the use of antibiotics  CRRT-related hypotension-resolved - Appreciate Nephrology input. CRRT 11/11. Reduced UF today and potential plan to discontinue tomorrow. Will need to be off pressors before eligible for iHD - Bicarb gtt - avoid nephrotoxins - Trend BMET  Ileus - improving Hypoglycemia - On trickle feeds and tolerating. May need TPN if unable to feed given prolonged hospitalization - Wean D20 gtt if improved glucose - Bowel regimen  Hypokalemia Hypomag - Replete  LE weakness and concern for paralysis- -New finding on exam 11/11-able to wiggle toes on exam 11/12 AM-sensory intact to BLE -Nsgy consulted>MRI showed cervical stenosis likely explains BLE weakness> no intervention recommended as thought to be chronic and risk of surgery outweighed benefits    Acute on chronic pain -Scheduled Acetaminophen  1g q8, PRN fentanyl  - Held Methocarbamol 500mg  tid, Gabapentin (home med) 100mg  tid, Oxy 5mg  PRN, Dilaudid 0.5mg  PRN   GOC - Remains guarded prognosis in  setting of poor lung mechanics and frailty. Nephrology concerned about dialysis dependence. Concern for high risk for cardiac event in setting of life support. Discussed QOL with family. - Sister and  brother have confirmed to remain full code on 11/14.   The patient is critically ill with multiple organ systems failure and requires high complexity decision making for assessment and support, frequent evaluation and titration of therapies, application of advanced monitoring technologies and extensive interpretation of multiple databases.  Independent Critical Care Time: 38 Minutes.   Slater Staff, M.D. [facility] Pulmonary/Critical Care Medicine 07/21/2024 9:16 AM   Please see Amion for pager number to reach on-call Pulmonary and Critical Care Team.

## 2024-07-22 ENCOUNTER — Inpatient Hospital Stay (HOSPITAL_COMMUNITY)

## 2024-07-22 DIAGNOSIS — J9621 Acute and chronic respiratory failure with hypoxia: Secondary | ICD-10-CM | POA: Diagnosis not present

## 2024-07-22 DIAGNOSIS — J90 Pleural effusion, not elsewhere classified: Secondary | ICD-10-CM | POA: Diagnosis not present

## 2024-07-22 DIAGNOSIS — J9622 Acute and chronic respiratory failure with hypercapnia: Secondary | ICD-10-CM | POA: Diagnosis not present

## 2024-07-22 DIAGNOSIS — Z930 Tracheostomy status: Secondary | ICD-10-CM | POA: Diagnosis not present

## 2024-07-22 DIAGNOSIS — I953 Hypotension of hemodialysis: Secondary | ICD-10-CM

## 2024-07-22 DIAGNOSIS — T17908D Unspecified foreign body in respiratory tract, part unspecified causing other injury, subsequent encounter: Secondary | ICD-10-CM

## 2024-07-22 LAB — CBC
HCT: 20.3 % — ABNORMAL LOW (ref 36.0–46.0)
Hemoglobin: 6.6 g/dL — CL (ref 12.0–15.0)
MCH: 31 pg (ref 26.0–34.0)
MCHC: 32.5 g/dL (ref 30.0–36.0)
MCV: 95.3 fL (ref 80.0–100.0)
Platelets: UNDETERMINED K/uL (ref 150–400)
RBC: 2.13 MIL/uL — ABNORMAL LOW (ref 3.87–5.11)
RDW: 15.9 % — ABNORMAL HIGH (ref 11.5–15.5)
WBC: 20.4 K/uL — ABNORMAL HIGH (ref 4.0–10.5)
nRBC: 22.8 % — ABNORMAL HIGH (ref 0.0–0.2)

## 2024-07-22 LAB — GLUCOSE, CAPILLARY
Glucose-Capillary: 110 mg/dL — ABNORMAL HIGH (ref 70–99)
Glucose-Capillary: 137 mg/dL — ABNORMAL HIGH (ref 70–99)
Glucose-Capillary: 137 mg/dL — ABNORMAL HIGH (ref 70–99)
Glucose-Capillary: 145 mg/dL — ABNORMAL HIGH (ref 70–99)
Glucose-Capillary: 146 mg/dL — ABNORMAL HIGH (ref 70–99)
Glucose-Capillary: 148 mg/dL — ABNORMAL HIGH (ref 70–99)
Glucose-Capillary: 173 mg/dL — ABNORMAL HIGH (ref 70–99)

## 2024-07-22 LAB — RENAL FUNCTION PANEL
Albumin: 1.7 g/dL — ABNORMAL LOW (ref 3.5–5.0)
Albumin: <1.5 g/dL — ABNORMAL LOW (ref 3.5–5.0)
Anion gap: 12 (ref 5–15)
Anion gap: 17 — ABNORMAL HIGH (ref 5–15)
BUN: 42 mg/dL — ABNORMAL HIGH (ref 8–23)
BUN: 64 mg/dL — ABNORMAL HIGH (ref 8–23)
CO2: 33 mmol/L — ABNORMAL HIGH (ref 22–32)
CO2: 30 mmol/L (ref 22–32)
Calcium: 7.4 mg/dL — ABNORMAL LOW (ref 8.9–10.3)
Calcium: 7 mg/dL — ABNORMAL LOW (ref 8.9–10.3)
Chloride: 90 mmol/L — ABNORMAL LOW (ref 98–111)
Chloride: 84 mmol/L — ABNORMAL LOW (ref 98–111)
Creatinine, Ser: 1.36 mg/dL — ABNORMAL HIGH (ref 0.44–1.00)
Creatinine, Ser: 2.1 mg/dL — ABNORMAL HIGH (ref 0.44–1.00)
GFR, Estimated: 41 mL/min — ABNORMAL LOW (ref >60)
GFR, Estimated: 24 mL/min — ABNORMAL LOW (ref >60)
Glucose, Bld: 142 mg/dL — ABNORMAL HIGH (ref 70–99)
Glucose, Bld: 213 mg/dL — ABNORMAL HIGH (ref 70–99)
Phosphorus: 4.6 mg/dL (ref 2.5–4.6)
Phosphorus: 6.4 mg/dL — ABNORMAL HIGH (ref 2.5–4.6)
Potassium: 4.8 mmol/L (ref 3.5–5.1)
Potassium: 5.1 mmol/L (ref 3.5–5.1)
Sodium: 135 mmol/L (ref 135–145)
Sodium: 131 mmol/L — ABNORMAL LOW (ref 135–145)

## 2024-07-22 LAB — HEPATITIS B SURFACE ANTIGEN: Hepatitis B Surface Ag: NONREACTIVE

## 2024-07-22 LAB — PREPARE RBC (CROSSMATCH)

## 2024-07-22 LAB — APTT
aPTT: 90 s — ABNORMAL HIGH (ref 24–36)
aPTT: 90 s — ABNORMAL HIGH (ref 24–36)
aPTT: 86 s — ABNORMAL HIGH (ref 24–36)

## 2024-07-22 LAB — MAGNESIUM: Magnesium: 2.1 mg/dL (ref 1.7–2.4)

## 2024-07-22 MED ORDER — TRACE MINERALS CU-MN-SE-ZN 300-55-60-3000 MCG/ML IV SOLN
INTRAVENOUS | Status: AC
  Filled 2024-07-22: qty 384

## 2024-07-22 MED ORDER — COLLAGENASE 250 UNIT/GM EX OINT
TOPICAL_OINTMENT | Freq: Every day | CUTANEOUS | Status: DC
  Administered 2024-07-22: 1 via TOPICAL
  Filled 2024-07-22: qty 30

## 2024-07-22 MED ORDER — SODIUM CHLORIDE 0.9% IV SOLUTION: Freq: Once | INTRAVENOUS | Status: AC

## 2024-07-22 MED ORDER — ERYTHROMYCIN ETHYLSUCCINATE 400 MG/5ML PO SUSR
400.0000 mg | Freq: Once | ORAL | Status: AC
  Administered 2024-07-22: 400 mg
  Filled 2024-07-22: qty 5

## 2024-07-22 MED ORDER — INSULIN ASPART 100 UNIT/ML IJ SOLN
0.0000 [IU] | INTRAMUSCULAR | Status: DC
  Administered 2024-07-22: 3 [IU] via SUBCUTANEOUS
  Administered 2024-07-22: 1 [IU] via SUBCUTANEOUS
  Administered 2024-07-22: 2 [IU] via SUBCUTANEOUS
  Administered 2024-07-23: 3 [IU] via SUBCUTANEOUS
  Administered 2024-07-23: 2 [IU] via SUBCUTANEOUS
  Administered 2024-07-23: 3 [IU] via SUBCUTANEOUS
  Administered 2024-07-23: 2 [IU] via SUBCUTANEOUS
  Administered 2024-07-24 (×2): 3 [IU] via SUBCUTANEOUS
  Filled 2024-07-22: qty 1
  Filled 2024-07-22 (×2): qty 3
  Filled 2024-07-22: qty 2
  Filled 2024-07-22: qty 3
  Filled 2024-07-22: qty 2
  Filled 2024-07-22: qty 3
  Filled 2024-07-22: qty 2
  Filled 2024-07-22: qty 3

## 2024-07-22 MED ORDER — KETAMINE HCL-SODIUM CHLORIDE 1000-0.69 MG/100ML-% IV SOLN
0.5000 mg/kg/h | INTRAVENOUS | Status: DC
  Administered 2024-07-22: 0.25 mg/kg/h via INTRAVENOUS
  Administered 2024-07-23 – 2024-07-24 (×2): 0.5 mg/kg/h via INTRAVENOUS
  Filled 2024-07-22 (×3): qty 100

## 2024-07-22 MED ORDER — REVEFENACIN 175 MCG/3ML IN SOLN
175.0000 ug | Freq: Every day | RESPIRATORY_TRACT | Status: DC
  Administered 2024-07-22 – 2024-07-31 (×10): 175 ug via RESPIRATORY_TRACT
  Filled 2024-07-22 (×10): qty 3

## 2024-07-22 MED ORDER — CHLORHEXIDINE GLUCONATE CLOTH 2 % EX PADS: 6.0000 | MEDICATED_PAD | Freq: Every day | CUTANEOUS | Status: DC

## 2024-07-22 MED ORDER — PIPERACILLIN-TAZOBACTAM IN DEX 2-0.25 GM/50ML IV SOLN
2.2500 g | Freq: Three times a day (TID) | INTRAVENOUS | Status: DC
  Administered 2024-07-22 – 2024-07-24 (×6): 2.25 g via INTRAVENOUS
  Filled 2024-07-22 (×7): qty 50

## 2024-07-22 NOTE — TOC Progression Note (Signed)
 Transition of Care [facility]) - Progression Note    Patient Details  Name: Katie Waller MRN: [id number] Date of Birth: 1945/09/21  [facility]) CM/SW Contact  Lauraine FORBES Saa, LCSWA Phone Number: 07/22/2024, 12:36 PM  Clinical Narrative:     12:36 PM Per progressions, patient is no longer on CRRT and is anticipated to start iHD. Patient remains with cortrak. RNCM and [facility] Liaison made aware. TOC will continue to follow.  Expected Discharge Plan: Long Term Acute Care (LTAC) Barriers to Discharge: Continued Medical Work up, [employer] and Services In-house Referral: Clinical Social Work Discharge Planning Services: [employer] Consult Post Acute Care Choice: Long Term Acute Care (LTAC) Living arrangements for the past 2 months: Single Family Home                                       Social Drivers of Health (SDOH) Interventions SDOH Screenings   Food Insecurity: No Food Insecurity (07/04/2024)  Housing: Low Risk  (07/04/2024)  Transportation Needs: No Transportation Needs (07/04/2024)  Utilities: Not At Risk (07/04/2024)  Depression (PHQ2-9): Low Risk  (06/05/2023)  Social Connections: Patient Unable To Answer (07/09/2024)  Tobacco Use: Medium Risk (06/25/2024)    Readmission Risk Interventions     No data to display

## 2024-07-22 NOTE — Consult Note (Addendum)
 WOC Nurse wound follow up Refer to previous WOC consult on 11/5. Pt was previously noted to have a Deep tissue pressure injury to bilat buttocks and sacrum. This has now evolved to 50% Stage 3 pressure injury, 50% unstageable slough/eschar.  Deep tissue pressure injury and loose peeling skin remains to wound edges. Affected area is approx 12X12X.3cm, mod amt pink drainage.    Pt is critically ill with multiple systemic factors which can impair healing; and it is difficult to keep the wound from becoming soiled related to the close proximity to the [facility], which is leaking liquid stool around the insertion site.  Dressing procedure/placement/frequency: Topical treatment recommendations provided for bedside nurses to perform as follows to assist with removal of nonviable tissue: Apply Santyl to bilat buttocks and sacrum Q day, then cover with moist gauze and sacrum foam dressing.  Change foam dressing Q 3 days or PRN soiling.  WOC team will continue to assess the location Q 7-10 days to determine if a change in the plan of care is indicated at that time.  Thank-you,  Stephane Fought MSN, RN, CWOCN, CWCN-AP, CNS Contact Mon-Fri 0700-1500: [phone]

## 2024-07-22 NOTE — Progress Notes (Signed)
 [facility] NEPHROLOGY PROGRESS NOTE  Assessment/ Plan: Pt is a 78 y.o. 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   # Severe anuric AKI: Baseline creatinine normal.  Likely ATN in the setting of sepsis.  Complicated by fluid overload.  Started CRRT on 11/11.  No signs of renal recovery at this time  -CRRT stopped on 11/16 -Concerns about fluid accumulation and BUN rising quickly -Plan for short dialysis session overnight, low-dose pressors permissible -Try to maintain on MWF schedule for dialysis -Long-term candidacy for dialysis questionable - Continue to monitor for renal recovery    # Acute on chronic hypoxic respiratory failure, metapneumovirus pneumonia: Initially treated with broad-spectrum antibiotics which was discontinued later.  Patient required tracheostomy on 11/9 and managing by pulmonary team.   # Bilateral pleural effusion, fluid overload: Patient required chest tube and has been managing by pulmonary team.  Ultrafiltration as tolerated  # Anemia: Hemoglobin 6.6 today.  Likely transfusion per primary team  Discussed with the patient's daughter, ICU nurse and provider.  Subjective: Patient stopped CRRT last night at 5 PM.  Unable to communicate this morning.  Family at bedside and denies any issues.  Objective Vital signs in last 24 hours: Vitals:   07/22/24 0832 07/22/24 0833 07/22/24 0900 07/22/24 1000  BP:      Pulse:  77    Resp:  (!) 27    Temp:      TempSrc:      SpO2: 100% 100% 100% 100%  Weight:      Height:       Weight change:   Intake/Output Summary (Last 24 hours) at 07/22/2024 1118 Last data filed at 07/22/2024  1101 Gross per 24 hour  Intake 4760.24 ml  Output 2914 ml  Net 1846.24 ml       Labs: RENAL PANEL Recent Labs  Lab 07/18/24 0312 07/18/24 1158 07/19/24 0515 07/19/24 1629 07/20/24 0432 07/20/24 1106 07/20/24 1615 07/21/24 0427 07/21/24 1513 07/21/24 1559 07/22/24 0308  NA 136  135   < > 134*   < > 133*   < > 137 136 136 136 135  K 4.8  4.8   < > 4.3   < > 4.1   < > 3.8 3.8 4.0 4.4 4.8  CL 99  99   < > 96*   < > 95*  --  98 95*  --  97* 90*  CO2 26  25   < > 26   < > 28  --  28 29  --  28 33*  GLUCOSE 185*  182*   < > 120*   < > 144*  --  117* 167*  --  187* 142*  BUN 46*  47*   < > 32*   < > 16  --  15 15  --  21 42*  CREATININE 1.62*  1.60*   < > 1.17*   < > 0.83  --  0.77 0.72  --  0.79 1.36*  CALCIUM  8.8*  8.7*   < > 8.6*   < > 7.8*  --  7.9* 8.0*  --  8.0* 7.4*  MG 2.8*  --  2.4  --  2.3  --   --  2.3  --   --  2.1  PHOS 3.9   < > 1.9*   < > 2.9  --  2.7 3.5  --  2.7 4.6  ALBUMIN 2.4*  2.5*   < > 2.2*   < > 2.1*  --  2.0* 2.1*  --  2.0* 1.7*   < > = values in this interval not displayed.    Liver Function Tests: Recent Labs  Lab 07/16/24 0200 07/16/24 1623 07/17/24 0458 07/17/24 1631 07/18/24 0312 07/18/24 1613 07/21/24 0427 07/21/24 1559 07/22/24 0308  AST 55*  --  52*  --  47*  --   --   --   --   ALT 29  --  35  --  39  --   --   --   --   ALKPHOS 43  --  47  --  52  --   --   --   --   BILITOT 0.4  --  0.6  --  0.7  --   --   --   --   PROT 5.3*  --  5.5*  --  6.0*  --   --   --   --   ALBUMIN 2.5*   < > 2.4*   < > 2.4*  2.5*   < > 2.1* 2.0* 1.7*   < > = values in this interval not displayed.   No results for input(s): LIPASE, AMYLASE in the last 168 hours.  No results for input(s): AMMONIA in the last 168 hours. CBC: Recent Labs    07/11/24 0228 07/12/24 0841 07/20/24 0432 07/20/24 1106 07/21/24 0427 07/21/24 1513 07/22/24 0308  HGB 8.9*   < > 7.7* 11.9* 7.7* 7.1* 6.6*  MCV 100.0   < > 95.2  --  95.3  --  95.3   VITAMINB12 1,029*  --   --   --   --   --   --   FOLATE >20.0  --   --   --   --   --   --   FERRITIN 762*  --   --   --   --   --   --   TIBC 227*  --   --   --   --   --   --   IRON 44  --   --   --   --   --   --   RETICCTPCT 1.7  --   --   --   --   --   --    < > = values in this interval not displayed.    Cardiac Enzymes: No results for input(s): CKTOTAL, CKMB, CKMBINDEX, TROPONINI in the last 168 hours. CBG: Recent Labs  Lab 07/21/24 1947 07/21/24 2331 07/22/24 0305 07/22/24 0354 07/22/24 0747  GLUCAP 210* 189* 145* 148* 137*    Iron Studies: No results for input(s): IRON, TIBC, TRANSFERRIN, FERRITIN in the last 72 hours. Studies/Results:   Medications: Infusions:  amiodarone 30 mg/hr (07/22/24 0900)   bivalirudin (ANGIOMAX) 250 mg in sodium chloride  0.9 % 500 mL (0.5 mg/mL) infusion 0.06 mg/kg/hr (07/22/24 1050)   dextrose  15 mL/hr at 07/22/24 0900   fentaNYL  infusion INTRAVENOUS 100 mcg/hr (07/22/24 0900)   ketamine (KETALAR) adult infusion 0.25 mg/kg/hr (07/22/24 1111)   norepinephrine (LEVOPHED) Adult infusion Stopped (07/22/24 0110)   piperacillin-tazobactam Stopped (07/22/24 0538)   prismasol BGK 4/2.5 Stopped (07/21/24 1705)   prismasol BGK 4/2.5 Stopped (07/21/24 1705)  prismasol BGK 4/2.5 Stopped (07/21/24 1705)   vasopressin 0.03 Units/min (07/22/24 0900)    Scheduled Medications:  sodium chloride    Intravenous Once   acetaminophen   1,000 mg Per Tube Q8H   arformoterol   15 mcg Nebulization BID   B-complex with vitamin C  1 tablet Per Tube Daily   Chlorhexidine  Gluconate Cloth  6 each Topical Daily   fentaNYL  (SUBLIMAZE ) injection  25-50 mcg Intravenous Once   insulin aspart  0-9 Units Subcutaneous Q4H   lidocaine -EPINEPHrine  20 mL Intradermal Once   neostigmine  0.25 mg Subcutaneous Q6H   mouth rinse  15 mL Mouth Rinse Q2H   pantoprazole (PROTONIX) IV  40 mg Intravenous Q24H   polyethylene glycol  17 g Per Tube Daily    revefenacin  175 mcg Nebulization Daily   sodium chloride  flush  10 mL Intrapleural Q8H   sodium chloride  flush  10-40 mL Intracatheter Q12H    have reviewed scheduled and prn medications.  Physical Exam: General: Chronically ill-appearing, lying in bed, no distress Heart: Normal rate, no rub Lungs: Coarse breath sound bilateral.  Bilateral chest rise Abdomen:soft, Non-tender, non-distended Extremities: Trace peripheral dependent edema present Dialysis Access: Left temporary HD catheter  Katie Waller 07/22/2024,11:18 AM  LOS: 27 days

## 2024-07-22 NOTE — Progress Notes (Signed)
 PHARMACY - ANTICOAGULATION CONSULT NOTE  Pharmacy Consult:  Bivalirudin  Indication: atrial fibrillation and internal jugular thombus   Allergies  Allergen Reactions   Ace Inhibitors Swelling   Diphenhydramine Other (See Comments)    Hyperactive/jittery    Doxycycline Hyclate Nausea And Vomiting   Codeine Nausea And Vomiting    Patient Measurements: Height: 5' 1 (154.9 cm) Weight: 74 kg (163 lb 2.3 oz) IBW/kg (Calculated) : 47.8 HEPARIN DW (KG): 63.1  Vital Signs: Temp: 98.2 F (36.8 C) (11/17 0808) Temp Source: Axillary (11/17 0808) BP: 100/44 (11/17 0604) Pulse Rate: 77 (11/17 0833)  Labs: Recent Labs    07/20/24 0432 07/20/24 1106 07/21/24 0427 07/21/24 1513 07/21/24 1559 07/22/24 0308 07/22/24 0915  HGB 7.7*   < > 7.7* 7.1*  --  6.6*  --   HCT 24.0*   < > 24.3* 21.0*  --  20.3*  --   PLT 206  --  205  --   --  PLATELET CLUMPS NOTED ON SMEAR, UNABLE TO ESTIMATE  --   APTT 82*   < > 66*  --  69* 90* 90*  CREATININE 0.83   < > 0.72  --  0.79 1.36*  --    < > = values in this interval not displayed.    Estimated Creatinine Clearance: 32.9 mL/min (A) (by C-G formula based on SCr of 1.36 mg/dL (H)).   Assessment: 44 YOF with recurrent Afib to resume bivalirudin.  She was previously on IV heparin and was switched to bivalirudin due to labile heparin levels.  Med was stopped because it was thought that Afib was secondary to critical illness, which resolved. However patient went back into Afib w/ RVR on 11/7 and bivalirudin has been resumed.  aPTT remains supra-therapeutic at 90 sec after rate reduction.  No issue with infusion nor bleeding.  Hemoglobin low at 6.6, to be transfused.  Goal of Therapy:  aPTT 50-85 seconds Monitor platelets by anticoagulation protocol: Yes   Plan:  Reduce bivalrudin to 0.06 mg/kg/hr (Wt: 66 kg) aPTT BID Daily CBC  Ashlan Dignan D. [place], PharmD, BCPS, BCCCP 07/22/2024, 10:48 AM

## 2024-07-22 NOTE — Progress Notes (Signed)
 PHARMACY - ANTICOAGULATION CONSULT NOTE  Pharmacy Consult:  Bivalirudin  Indication: atrial fibrillation and internal jugular thombus   Allergies  Allergen Reactions   Ace Inhibitors Swelling   Diphenhydramine Other (See Comments)    Hyperactive/jittery    Doxycycline Hyclate Nausea And Vomiting   Codeine Nausea And Vomiting    Patient Measurements: Height: 5' 1 (154.9 cm) Weight: 74 kg (163 lb 2.3 oz) IBW/kg (Calculated) : 47.8 HEPARIN DW (KG): 63.1  Vital Signs: Temp: 99.4 F (37.4 C) (11/17 1958) Temp Source: Axillary (11/17 1958) BP: 117/42 (11/17 1947) Pulse Rate: 75 (11/17 1947)  Labs: Recent Labs    07/20/24 0432 07/20/24 1106 07/21/24 0427 07/21/24 1513 07/21/24 1559 07/22/24 0308 07/22/24 0915 07/22/24 2012  HGB 7.7*   < > 7.7* 7.1*  --  6.6*  --   --   HCT 24.0*   < > 24.3* 21.0*  --  20.3*  --   --   PLT 206  --  205  --   --  PLATELET CLUMPS NOTED ON SMEAR, UNABLE TO ESTIMATE  --   --   APTT 82*   < > 66*  --  69* 90* 90* 86*  CREATININE 0.83   < > 0.72  --  0.79 1.36*  --   --    < > = values in this interval not displayed.    Estimated Creatinine Clearance: 32.9 mL/min (A) (by C-G formula based on SCr of 1.36 mg/dL (H)).   Assessment: 38 YOF with recurrent Afib to resume bivalirudin.  She was previously on IV heparin and was switched to bivalirudin due to labile heparin levels.  Med was stopped because it was thought that Afib was secondary to critical illness, which resolved. However patient went back into Afib w/ RVR on 11/7 and bivalirudin has been resumed.  11/17 PM: aPTT remains supra-therapeutic at 86 sec after rate reduction (barely supratherapeutic).  Will leave as is for now, F/U in AM.   Goal of Therapy:  aPTT 50-85 seconds Monitor platelets by anticoagulation protocol: Yes   Plan:  Continue bivalrudin at 0.06 mg/kg/hr (Wt: 66 kg) aPTT BID Daily CBC  Massie Fila, PharmD Clinical Pharmacist  07/22/2024 8:56 PM

## 2024-07-22 NOTE — Clinical Note (Incomplete)
 Called into room by patients daughter for something broken. Discovered pleura vac tubing was cracked and circuit was open to ambient air and NOT sealed. No drainage on the floor. Pt was on full support on ventilator. No difficulty breathing noted. No patient discomfort assessed. RN Immediately clamped tubing and replaced pleura vac with new circuit and released clamp. No bubbling noted after clamp released. Re-taped connection to chest tube and assessed patient for impaired or difficulty breathing. Breath sounds equal bilaterally. No residual bubbling or tidaling in pleura vac.  On follow up inspection. Pleura vac plastic nipple broken off flush with pleura vac case. Strongly suspect that foot of bed was lowered onto pleuravac and damaged pleuravac.

## 2024-07-22 NOTE — Progress Notes (Signed)
 PHARMACY - TOTAL PARENTERAL NUTRITION CONSULT NOTE  Indication: Ileus, Intolerance to EN  Patient Measurements: Height: 5' 1 (154.9 cm) Weight: 74 kg (163 lb 2.3 oz) IBW/kg (Calculated) : 47.8 TPN AdjBW (KG): 53.6 Body mass index is 30.83 kg/m.  Assessment:  10 YOF presented on 10/21 with PNA/COPD exacerbation, hospitalization complicated by ARF requiring CRRT, PTX, cervical stenosis/paralysis and Afib RVR.  Patient initially tolerated TF and was having diarrhea requiring fiber.  Ileus developed around 11/12 and TF/fiber were held, started on Reglan , Relistor and neostigmine.  Trickle TF resumed on 11/15 and now held given significant NG output and aspiration.  Pharmacy consulted to dose TPN given prolonged ileus.  Glucose / Insulin: hx DM not on insulin PTA, A1c 5.6% CBGs improving Used 13 units sSSI in the past 24 hrs Electrolytes: low CL and high CO2, others WNL (K high normal, and Phos high normal post supplementation) Renal: CRRT 11/11 >> 11/16 - SCr 1.36, BUN 42 Hepatic: LFTs WNL except mildly elevated AST, lipase 99, tbili WNL, albumin 1.7 Intake / Output; MIVF: NG > 1L output GI Imaging: none since TPN initiation GI Surgeries / Procedures: none since TPN initiation  Central access: PICC placed 07/12/24 TPN start date: 07/22/24  Nutritional Goals: Goal concentrated TPN rate is 60 mL/hr to provide 1738 kCal and 115g AA per day  RD Estimated Needs Total Energy Estimated Needs: 1700-1900 Total Protein Estimated Needs: 110-125 grams Total Fluid Estimated Needs: >/=1.5L  Current Nutrition:  NPO  Plan:  Start concentrated TPN at 30 mL/hr at 1800 (goal rate 60 ml/hr) Electrolytes in TPN: Na 29mEq/L, K 55mEq/L, Ca 42mEq/L, Mg 68mEq/L, Phos 59mmol/L, max CL Add standard MVI and trace elements to TPN (D/C BComplex) Increase to moderate SSI Q4H KPhos Neutral 500mg  PT x 2 doses this AM D/C D20W at 1800 when TPN starts Standard TPN labs and nursing care orders  Katie Derrick D. Waller,  PharmD, BCPS, BCCCP 07/22/2024, 11:52 AM

## 2024-07-22 NOTE — Progress Notes (Signed)
 Katie Waller, MRN:  [id number], DOB:  26-Sep-1945, LOS: 27 ADMISSION DATE:  06/25/2024, CONSULTATION DATE:  06/25/2024 REFERRING MD:  Theodoro SAUNDERS, CHIEF COMPLAINT:  AMS, Sepsis, AR  History of Present Illness:  Katie Waller is a 78 yo female with past medical history significant for HTN, COPD, acute on chronic hypoxic, hypercapnic respiratory failure on 5-6L [place] baseline, diastolic heart failure, SCC Left lower lobe s/p radiation in September 2025, who presented via EMS after being found unresponsive at home. Code stroke called. On arrival to ED patient emergently intubated for acute respiratory distress and airway protection. CT Head with no ICH or large infarct, CTA head and neck with no LVO. CXR concerning for a Pneumonia with visible BL lung apices on CTA demonstrating infection/inflammation. Code stroke cancelled with presentation suspicious for sepsis. Patient received 30ml/kg crystalloid, pan cultured, and started empirically on Cefepime/Vanc/Azithromycin . Of note, family reports patient has recently not felt well and likely has had a cold. PCCM consulted for ICU admission.   On exam in ED, patient febrile to >101 and hypotensive, likely multifactorial in setting of presumed sepsis and recent sedation on induction with underlying heart disease. Norepinephrine gtt was initiated for MAPs < 65 and patient was transferred to ICU with plans to gain central access for vasopressor administration and arterial line placement for hemodynamic monitoring.  Pertinent  Medical History  Per above  Significant Hospital Events: Including procedures, antibiotic start and stop dates in addition to other pertinent events   10/21 Found unresponsive at home->ED>Code stroke>intubated for ARD/airway protection>CTH/CTA negative>Code stroke cancelled>Hypotensive>started on pressors>Admit to ICU  Arterial line placed 10/21 10/22 and 10/23 unsuccessful extubation 10/24: Despite maximal Precedex and fentanyl   pushes, unable to safely and comfortably clean the patient up so added prn Versed and started fentanyl  infusion. Rectal tube added for diarrhea.  10/24: 2/3 BC resulted in staph capitis/epidermitis, extubated on BiPAP 10/25: patient remained on BiPAP for 24 hours, when she came off of BiPAP, could not tolerate became tachypneic, tachycardic went into respiratory distress requiring endotracheal reintubation 10/29: unsuccessful SBT, went briefly back into RVR 10/30: unsuccessful weaning from sedation as BP/HR increased considerably 11/2: extubated to HFNC 11/8: progressively became more hypoxic with decreased respiratory drive prompting ABG this a.m. which revealed toxic hypercapnic respiratory failure.  Transferred back to ICU for impending intubation; tried draining pleural spaces but still tired out 11/10: trach 11/10: aggressive diuresis with Lasix  80 followed by 160 followed by 260 + metolazone and bicarb - no response>Nephrology consulted 11/11: nephro consult. LE weakness noted on exam with inability to move - neuro consulted, MRI C and T spine ordered. CT pulled yesterday, worsening ptx>2 R CT placed, ptx improved. CRRT started. Hypotensive>briefly on Levo 11/12: Levo stopped. MRI C spine>cervical stenosis.  11/13 Off TF.  11/14: Worsening hypotension in setting of tension pneumothorax. Resolved after chest tube was flushed with improved pressor requirement. Levophed now 5 mcg/min 11/15 back on TF 11/16 started zosyn for leukocytosis  Interim History / Subjective:  Off CRRT overnight. Vasopressin added, titrated down NE. 1 unit pRBC given overnight. Back on amiodarone overnight. Remains on D20 gtt, fentanyl , bival, bicarb, NE. Has remained on trickle TF. RN reported significant distention this morning> rapidly fill NG cannister when OGT placed to suction.  Objective    Blood pressure (!) 100/44, pulse 77, temperature 98.2 F (36.8 C), temperature source Axillary, resp. rate (!) 27,  height 5' 1 (1.549 m), weight 74 kg, SpO2 100%. CVP:  [10 mmHg] 10 mmHg  Vent Mode: PSV;CPAP FiO2 (%):  [40 %] 40 % Set Rate:  [25 bmp] 25 bmp Vt Set:  [380 mL] 380 mL PEEP:  [5 cmH20] 5 cmH20 Pressure Support:  [14 cmH20] 14 cmH20 Plateau Pressure:  [14 cmH20-15 cmH20] 14 cmH20   Intake/Output Summary (Last 24 hours) at 07/22/2024 0945 Last data filed at 07/22/2024 0758 Gross per 24 hour  Intake 4605.02 ml  Output 1884 ml  Net 2721.02 ml   Filed Weights   07/19/24 0500 07/20/24 0500 07/22/24 0500  Weight: 76.3 kg 71.7 kg 74 kg    Physical Exam: General: chronically ill appearing woman lying in bed in NAD HENT: [place]/AT, eyes anicteric Neck: trach in place Respiratory: Rhonchi on left, thick brown secretions from trach similar to what's draining of OGT. Synchronous with MV.  Cardiovascular: S1S2 RRR GI: distended, soft, NT. Absent bowel sounds. Per RN-- much less distended now than his earlier exam. Extremities: minimal muscle mass Neuro: Opens eyes, mostly answers y/n questions for daughter. Moving arms.   UOP 0 Chest tube 24cc I/O + 2.2L past 24 hrs  Bicarb 33 BUN 42 Cr 1.36 WBC 20.4 H/H 6.6/20.3 Platelets clumped  CXR personally reviewed> R chest tube, resolved pneumothorax. 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 No air leak.>CT pulled 11/12.  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  - address abdominal distention today>  OGT to suction -pulmonary hygiene -stop ICS due to high pneumonia risk -add LAMA due to severe COPD -con't brovana  -LTVV -VAP prevention protocol -PAD protocol for sedation -daily SAT & SBT as appropriate -chest tube to suction -CXR con't zosyn, culture trach aspirate today  Shock - Multifactorial in setting of CRRT, sedation. Presume sepsis is contributing Previously in tension ptx but resolved -NE & vaso as needed to maintain SBP >100, MAP >55 -zosyn -last steroids 10/31; had been off steroids and pressors until NE restarted on 11/8  Afib with RVR, new onset, paroxysmal Chronic HFpEF  HTN CRRT-related hypotension -IV amiodarone & bival -tele monitoring -monitor electrolytes and replete as needed   RIJ CVL associated blood clot -bival  AOC anemia  -1 unit pRBC today -TPN -no IV iron with concern for active infection  Anuric AKI Likely 2/2 sepsis/pneumonia and the use of antibiotics  CRRT-related hypotension-resolved - tentatively trial of iHD tonight, but she is still on 2 pressors - Bicarb gtt stopped - avoid nephrotoxins -strict I/O -renally dose meds, avoid nephrotoxic meds  Ileus - worse; has severe gastric distention and high OGT output today with similar secretions from trach as from OGT -OGT to suction -no enteral meds -trial of erythromycin -stop reglan , neostigmine -limiting opiates as able; adding ketamine for sedation  Hypoglycemia - TPN, stopping  D20w as able  LE weakness and concern for paralysis--New finding on exam 11/11-able to wiggle toes on exam 11/12 AM-sensory intact to BLE Cervical stenosis -Appreciate NS's management; no plans for intervention of cervical stenosis given overall condition -con't to assess motor functionwith sedation being lightened -PT, OT  Acute on chronic pain -fentanyl  gtt -adding ketamine for synergy and opiate sparing effect  GOC -Remains guarded prognosis in setting of poor lung mechanics and frailty.  Nephrology concerned about dialysis dependence.   -Daughter and son have been clear throughout admission that they desire full scope of care.   Daughter updated at bedside during rounds.    This patient is critically ill with multiple organ system failure which requires frequent high complexity decision making, assessment, support, evaluation, and titration of therapies. This was completed through the application of advanced monitoring technologies and extensive interpretation of multiple databases. During this encounter critical care time was devoted to patient care services described in this note for 48 minutes.  Katie SHAUNNA Gaskins, DO 07/22/24 9:47 AM [facility]  For contact information, see Amion. If no response to pager, please call PCCM consult pager. After hours, 7PM- 7AM, please call Elink.

## 2024-07-22 NOTE — Progress Notes (Signed)
 PHARMACY - ANTICOAGULATION CONSULT NOTE  Pharmacy Consult:  Bivalirudin  Indication: atrial fibrillation and internal jugular thombus   Allergies  Allergen Reactions   Ace Inhibitors Swelling   Diphenhydramine Other (See Comments)    Hyperactive/jittery    Doxycycline Hyclate Nausea And Vomiting   Codeine Nausea And Vomiting    Patient Measurements: Height: 5' 1 (154.9 cm) Weight: 71.7 kg (158 lb 1.1 oz) IBW/kg (Calculated) : 47.8 HEPARIN DW (KG): 63.1  Vital Signs: Temp: 98.1 F (36.7 C) (11/17 0352) Temp Source: Axillary (11/17 0352) Pulse Rate: 90 (11/17 0330)  Labs: Recent Labs    07/20/24 0432 07/20/24 1106 07/21/24 0427 07/21/24 1513 07/21/24 1559 07/22/24 0308  HGB 7.7*   < > 7.7* 7.1*  --  6.6*  HCT 24.0*   < > 24.3* 21.0*  --  20.3*  PLT 206  --  205  --   --  PENDING  APTT 82*   < > 66*  --  69* 90*  CREATININE 0.83   < > 0.72  --  0.79 1.36*   < > = values in this interval not displayed.    Estimated Creatinine Clearance: 32.4 mL/min (A) (by C-G formula based on SCr of 1.36 mg/dL (H)).   Medical History: Past Medical History:  Diagnosis Date   Abnormal finding on EKG 09/19/2013   Acute on chronic respiratory failure with hypercapnia (HCC)    Acute respiratory failure (HCC) 06/08/2016   Anxiety 11/09/2020   Arthritis    Asthma    Carotid bruit 11/09/2020   Chronic diastolic CHF (congestive heart failure) (HCC) 07/28/2017   Chronic obstructive pulmonary disease, unspecified (HCC) 11/09/2020   COPD, group D, by GOLD 2017 classification (HCC) 09/17/2013   Cough 11/09/2020   Decreased estrogen level 11/09/2020   Diabetes (HCC)    Dyspnea    Edema 11/09/2020   Essential hypertension 09/18/2013   Goals of care, counseling/discussion    Hardening of the aorta (main artery of the heart) 11/09/2020   Heart disease    History of radiation therapy    Left Lung- 06/20/23-06/26/23- Dr. 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  She was previously on IV heparin and was switched to bivalirudin due to labile heparin levels.  Med was stopped because it was thought that Afib was secondary to critical illness, which resolved. However patient went back into Afib w/ RVR on 11/7.   aPTT supra-therapeutic 11/17 AM  Goal of Therapy:  aPTT 50-85 seconds Monitor platelets by anticoagulation protocol: Yes   Plan:  Dec bivalrudin to 0.08 mg/kg/hr (Wt: 66 kg) Re-check aPTT in 4-6 hours aPTT BID Daily CBC  Lynwood Mckusick, PharmD, BCPS Clinical Pharmacist Phone: [phone]

## 2024-07-22 NOTE — Progress Notes (Signed)
 Date and time results received: 07/22/24 0338  Test: Hgb  Critical Value: 6.6  Name of Provider Notified: Dr. Haze Corean JAYSON Marci

## 2024-07-22 NOTE — Progress Notes (Signed)
 Nutrition Follow-up  DOCUMENTATION CODES:  Not applicable  INTERVENTION:  TPN initiation via PICC to meet nutrition needs as pt has been with poor nutritional intake >50% of her admission Nutrition recommendations are below Management per pharmacy team When able to restart enteral feeds, recommend the following goal regimen: [employer] 1.4 RTH at 50 ml/h (1200 ml per day) Prosource TF20 60 ml BID Provides 1840 kcal, 114 gm protein, 864 ml free water daily Discontinue enteral medications until able to utilize GI tract  NUTRITION DIAGNOSIS:  Inadequate oral intake related to acute illness as evidenced by NPO status. Ongoing.   GOAL:  Patient will meet greater than or equal to 90% of their needs Progressing, TPN to be initiated  MONITOR:  Vent status, Labs, Weight trends, TF tolerance  REASON FOR ASSESSMENT:  Consult  (CRRT)  ASSESSMENT:  Pt admitted with AMS, sepsis, ARF. 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  10/21 - admitted d/t PNA, HMPV infection; intubated; stroke/sz ruled out 10/23 - failed SBT, TF initiated 10/24 - extubated 10/25 - re-intubated 11/2 - extubated, no access for TF 11/03 - advanced to clear liquid diet 11/04 - advanced to full liquid diet 11/05 - transfer out of ICU; photo of sacral wound; doc as DTI 11/07 - Cortrak placed, TF started 11/08 - hypoxic; transfer to ICU (4N) 11/09 - trach placed; echo: dilated IVC and RA pressures 11/12 - FMS inserted, only small amount of stool in tubing; noted sacral wound documented as stage II 11/13 - transferred to 47M, TF off for vomiting 11/15 - TF restarted at trickle 11/16 - CRRT held 11/17 - NGT to suction,   Pt resting in bed at the time of assessment, remains on vent via trach. CRRT now off. EN was briefly re-attempted this weekend but did not tolerate. TPN to be initiated today.   Worsening skin  integrity noted to sacral/buttocks area.   When stable to trial enteral nutrition again, would recommend ensuring that cortrak tube is post-pyloric. Pt meets criteria for acute malnutrition given her muscle depletions and meeting <50% of her needs >5 days.  MV: 13.3 L/min Temp (24hrs), Avg:98.1 F (36.7 C), Min:97.4 F (36.3 C), Max:99.2 F (37.3 C) MAP (art line): 62-49mmHg this AM  Admit weight: 71 kg  Current weight: 76.3 kg Pitting edema noted to all extremities   Intake/Output Summary (Last 24 hours) at 07/22/2024 1032 Last data filed at 07/22/2024 1022 Gross per 24 hour  Intake 4972.53 ml  Output 2243 ml  Net 2729.53 ml  Net IO Since Admission: -484.39 mL [07/22/24 1032]  Drains/Lines: PICC double lumen Temporary HD Catheter, left IJ Art Line left radial Chest tube, right, 34mL x 24 hours NGT, right nare, out x 24 hours FMS 10mL out x 24 hours UOP 0 mL out x 24 hours Trach #6 shiley cuffed Cortrak, left nare   Average Meal Intake: 10/22-11/4: 17% intake x 3 recorded meals  Nutritionally Relevant Medications: Scheduled Meds:  B-complex with vitamin C  1 tablet Per Tube Daily   insulin aspart  0-9 Units Subcutaneous Q4H   metoCLOPramid  5 mg Intravenous Q12H   JUVEN  1 packet Per Tube BID BM   pantoprazoleIV  40 mg Intravenous Q24H   polyethylene glycol  17 g Per Tube Daily   Continuous Infusions:  bivalirudin (ANGIOMAX) 250 mg in sodium chloride  0.9 % 500 mL (0.5 mg/mL) infusion 0.0803 mg/kg/hr (07/22/24 0900)  dextrose  15 mL/hr at 07/22/24 0900   feeding supplement (VITAL 1.5 CAL) 20 mL/hr at 07/22/24 0900   piperacillin-tazobactam Stopped (07/22/24 0538)   vasopressin 0.03 Units/min (07/22/24 0900)   PRN Meds: ondansetron , polyethylene glycol, simethicone  Labs Reviewed: Chloride 90 BUN 42, creatinine 1.36 CBG ranges from 130-210 mg/dL over the last 24 hours HgbA1c 5.6%  NUTRITION - FOCUSED PHYSICAL EXAM: Flowsheet Row Most Recent Value   Orbital Region No depletion  Upper Arm Region No depletion  [L arm edema]  Thoracic and Lumbar Region No depletion  Buccal Region No depletion  Temple Region No depletion  Clavicle Bone Region Mild depletion  Clavicle and Acromion Bone Region Moderate depletion  Scapular Bone Region Moderate depletion  Dorsal Hand Moderate depletion  Patellar Region Unable to assess  [edema]  Anterior Thigh Region Unable to assess  [edema]  Posterior Calf Region Unable to assess  [edema]  Edema (RD Assessment) Moderate  Hair Reviewed  Eyes Reviewed  Mouth Unable to assess  Skin Reviewed  Nails Reviewed    Diet Order:   Diet Order             Diet NPO time specified  Diet effective now                   EDUCATION NEEDS:  Not appropriate for education at this time  Skin: Skin Integrity Issues: Skin Integrity Issues:: DTI, Stage II DTI: L heel, sacrum Stage III: sacrum Per WOC 11/17: Pt was previously noted to have a Deep tissue pressure injury to bilat buttocks and sacrum. This has now evolved to 50% Stage 3 pressure injury, 50% unstageable slough/eschar.  Deep tissue pressure injury and loose peeling skin remains to wound edges. Affected area is approx 12X12X.3cm, mod amt pink drainage.   Last BM:  11/13 - type 7 via FMS FMS placed 11/12  Height:  Ht Readings from Last 1 Encounters:  06/30/24 5' 1 (1.549 m)    Weight:  Wt Readings from Last 1 Encounters:  07/22/24 74 kg    Ideal Body Weight:  47.7 kg  BMI:  Body mass index is 30.83 kg/m.  Estimated Nutritional Needs:  Kcal:  1700-1900 Protein:  110-125 grams Fluid:  >/=1.5L   Vernell Lukes, RD, LDN, CNSC Registered Dietitian II Please reach out via secure chat

## 2024-07-23 DIAGNOSIS — K567 Ileus, unspecified: Secondary | ICD-10-CM

## 2024-07-23 DIAGNOSIS — J90 Pleural effusion, not elsewhere classified: Secondary | ICD-10-CM | POA: Diagnosis not present

## 2024-07-23 DIAGNOSIS — J9621 Acute and chronic respiratory failure with hypoxia: Secondary | ICD-10-CM | POA: Diagnosis not present

## 2024-07-23 DIAGNOSIS — Z930 Tracheostomy status: Secondary | ICD-10-CM | POA: Diagnosis not present

## 2024-07-23 DIAGNOSIS — T17908A Unspecified foreign body in respiratory tract, part unspecified causing other injury, initial encounter: Secondary | ICD-10-CM

## 2024-07-23 DIAGNOSIS — J9622 Acute and chronic respiratory failure with hypercapnia: Secondary | ICD-10-CM | POA: Diagnosis not present

## 2024-07-23 LAB — RENAL FUNCTION PANEL
Albumin: <1.5 g/dL — ABNORMAL LOW (ref 3.5–5.0)
Anion gap: 19 — ABNORMAL HIGH (ref 5–15)
BUN: 73 mg/dL — ABNORMAL HIGH (ref 8–23)
CO2: 28 mmol/L (ref 22–32)
Calcium: 7.8 mg/dL — ABNORMAL LOW (ref 8.9–10.3)
Chloride: 86 mmol/L — ABNORMAL LOW (ref 98–111)
Creatinine, Ser: 2.47 mg/dL — ABNORMAL HIGH (ref 0.44–1.00)
GFR, Estimated: 20 mL/min — ABNORMAL LOW (ref >60)
Glucose, Bld: 125 mg/dL — ABNORMAL HIGH (ref 70–99)
Phosphorus: 6.5 mg/dL — ABNORMAL HIGH (ref 2.5–4.6)
Potassium: 5.1 mmol/L (ref 3.5–5.1)
Sodium: 133 mmol/L — ABNORMAL LOW (ref 135–145)

## 2024-07-23 LAB — GLUCOSE, CAPILLARY
Glucose-Capillary: 124 mg/dL — ABNORMAL HIGH (ref 70–99)
Glucose-Capillary: 117 mg/dL — ABNORMAL HIGH (ref 70–99)
Glucose-Capillary: 117 mg/dL — ABNORMAL HIGH (ref 70–99)
Glucose-Capillary: 182 mg/dL — ABNORMAL HIGH (ref 70–99)
Glucose-Capillary: 149 mg/dL — ABNORMAL HIGH (ref 70–99)
Glucose-Capillary: 161 mg/dL — ABNORMAL HIGH (ref 70–99)

## 2024-07-23 LAB — CBC
HCT: 20.6 % — ABNORMAL LOW (ref 36.0–46.0)
Hemoglobin: 7.1 g/dL — ABNORMAL LOW (ref 12.0–15.0)
MCH: 30.9 pg (ref 26.0–34.0)
MCHC: 34.5 g/dL (ref 30.0–36.0)
MCV: 89.6 fL (ref 80.0–100.0)
Platelets: 151 K/uL (ref 150–400)
RBC: 2.3 MIL/uL — ABNORMAL LOW (ref 3.87–5.11)
RDW: 16.5 % — ABNORMAL HIGH (ref 11.5–15.5)
WBC: 18.7 K/uL — ABNORMAL HIGH (ref 4.0–10.5)
nRBC: 11.7 % — ABNORMAL HIGH (ref 0.0–0.2)

## 2024-07-23 LAB — MAGNESIUM: Magnesium: 2.1 mg/dL (ref 1.7–2.4)

## 2024-07-23 LAB — APTT
aPTT: 83 s — ABNORMAL HIGH (ref 24–36)
aPTT: 108 s — ABNORMAL HIGH (ref 24–36)

## 2024-07-23 MED ORDER — TRACE MINERALS CU-MN-SE-ZN 300-55-60-3000 MCG/ML IV SOLN
INTRAVENOUS | Status: AC
  Filled 2024-07-23: qty 768

## 2024-07-23 MED ORDER — MIDODRINE HCL 5 MG PO TABS
5.0000 mg | ORAL_TABLET | Freq: Three times a day (TID) | ORAL | Status: DC
  Administered 2024-07-23 – 2024-07-24 (×5): 5 mg
  Filled 2024-07-23 (×5): qty 1

## 2024-07-23 MED ORDER — LORAZEPAM 2 MG/ML IJ SOLN
0.2500 mg | Freq: Once | INTRAMUSCULAR | Status: AC
  Administered 2024-07-23: 0.25 mg via INTRAVENOUS
  Filled 2024-07-23: qty 1

## 2024-07-23 MED ORDER — LIDOCAINE-PRILOCAINE 2.5-2.5 % EX CREA: 1.0000 | TOPICAL_CREAM | CUTANEOUS | Status: DC | PRN

## 2024-07-23 MED ORDER — PENTAFLUOROPROP-TETRAFLUOROETH EX AERO
1.0000 | INHALATION_SPRAY | CUTANEOUS | Status: DC | PRN
Start: 2024-07-23 — End: 2024-08-01

## 2024-07-23 MED ORDER — SODIUM CHLORIDE 0.9 % IV SOLN
0.0280 mg/kg/h | INTRAVENOUS | Status: DC
  Administered 2024-07-23: 0.039 mg/kg/h via INTRAVENOUS
  Administered 2024-07-25: 0.028 mg/kg/h via INTRAVENOUS
  Filled 2024-07-23 (×3): qty 250

## 2024-07-23 MED ORDER — HEPARIN SODIUM (PORCINE) 1000 UNIT/ML DIALYSIS
1000.0000 [IU] | INTRAMUSCULAR | Status: DC | PRN
  Administered 2024-07-23 – 2024-07-25 (×2): 2800 [IU] via INTRAVENOUS_CENTRAL
  Administered 2024-07-27: 1000 [IU] via INTRAVENOUS_CENTRAL
  Administered 2024-07-28: 2800 [IU] via INTRAVENOUS_CENTRAL
  Filled 2024-07-23 (×4): qty 1

## 2024-07-23 MED ORDER — LIDOCAINE HCL (PF) 1 % IJ SOLN
5.0000 mL | INTRAMUSCULAR | Status: DC | PRN
Start: 2024-07-23 — End: 2024-08-01

## 2024-07-23 MED ORDER — METHYLNALTREXONE BROMIDE 12 MG/0.6ML ~~LOC~~ SOLN
12.0000 mg | Freq: Once | SUBCUTANEOUS | Status: AC
  Administered 2024-07-23: 12 mg via SUBCUTANEOUS
  Filled 2024-07-23: qty 0.6

## 2024-07-23 MED ORDER — ALTEPLASE 2 MG IJ SOLR: 2.0000 mg | Freq: Once | INTRAMUSCULAR | Status: DC | PRN

## 2024-07-23 MED ORDER — ERYTHROMYCIN ETHYLSUCCINATE 400 MG/5ML PO SUSR
400.0000 mg | Freq: Once | ORAL | Status: AC
  Administered 2024-07-23: 400 mg
  Filled 2024-07-23: qty 5

## 2024-07-23 NOTE — Progress Notes (Signed)
 OT Cancellation Note  Patient Details Name: JANAYA BROY MRN: [id number] DOB: 1945/12/22   Cancelled Treatment:    Reason Eval/Treat Not Completed: Patient at procedure or test/ unavailable (getting HD) Pt noted to be on vent, Hemoglobin of 7.1 OT will continue to follow acutely for evaluation as schedule and medical appropriateness allows.    Leita PARAS Shannie Kontos 07/23/2024, 8:34 AM  Leita DEL OTR/L Acute Rehabilitation Services Office: [phone]

## 2024-07-23 NOTE — Progress Notes (Signed)
 [facility] NEPHROLOGY PROGRESS NOTE  Assessment/ Plan: Pt is a 78 y.o. 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   # Severe anuric AKI: Baseline creatinine normal.  Likely ATN in the setting of sepsis.  Complicated by fluid overload.  CRRT on 11/11-16.  No signs of renal recovery at this time  -Concerns about fluid accumulation and BUN rising quickly -Plan for dialysis this morning -Start midodrine to help with hypotension -Try to maintain on MWF schedule for dialysis -Long-term candidacy for dialysis questionable - Continue to monitor for renal recovery    # Acute on chronic hypoxic respiratory failure, metapneumovirus pneumonia: Initially treated with broad-spectrum antibiotics which was discontinued later.  Patient required tracheostomy on 11/9 and managing by pulmonary team.   # Bilateral pleural effusion, fluid overload: Patient required chest tube and has been managing by pulmonary team.  Ultrafiltration as tolerated  # Anemia: Hemoglobin 7.1.  Transfusions as needed.  Consider ESA and iron if needed  # Hypoalbuminemia: Will provide IV albumin intermittently with dialysis as needed to help with hypotension  # Shock: Multifactorial.  Continue with vasopressin and norepinephrine if needed.  Start midodrine 5 mg 3 times daily.  Discussed with the patient's daughter, ICU team  Subjective: Patient stable overnight in general.  Only on vasopressin.  Norepinephrine stopped.  Planning on dialysis this morning  Objective Vital signs in last 24 hours: Vitals:   07/23/24 0816 07/23/24 0830 07/23/24 0845 07/23/24 0900  BP:  (!) 174/50 (!) 147/47 (!) 144/44   Pulse:  96 93 94  Resp:  (!) 22 (!) 23 (!) 22  Temp: 98.5 F (36.9 C)     TempSrc: Oral     SpO2:  100% 100% 100%  Weight:      Height:       Weight change: 1 kg  Intake/Output Summary (Last 24 hours) at 07/23/2024 0905 Last data filed at 07/23/2024 0700 Gross per 24 hour  Intake 2152.53 ml  Output 1590 ml  Net 562.53 ml       Labs: RENAL PANEL Recent Labs  Lab 07/19/24 0515 07/19/24 1629 07/20/24 0432 07/20/24 1106 07/21/24 0427 07/21/24 1513 07/21/24 1559 07/22/24 0308 07/22/24 2012 07/23/24 0534  NA 134*   < > 133*   < > 136 136 136 135 131* 133*  K 4.3   < > 4.1   < > 3.8 4.0 4.4 4.8 5.1 5.1  CL 96*   < > 95*   < > 95*  --  97* 90* 84* 86*  CO2 26   < > 28   < > 29  --  28 33* 30 28  GLUCOSE 120*   < > 144*   < > 167*  --  187* 142* 213* 125*  BUN 32*   < > 16   < > 15  --  21 42* 64* 73*  CREATININE 1.17*   < > 0.83   < > 0.72  --  0.79 1.36* 2.10* 2.47*  CALCIUM  8.6*   < > 7.8*   < > 8.0*  --  8.0* 7.4* 7.0* 7.8*  MG 2.4  --  2.3  --  2.3  --   --  2.1  --  2.1  PHOS 1.9*   < > 2.9   < > 3.5  --  2.7 4.6 6.4* 6.5*  ALBUMIN 2.2*   < > 2.1*   < > 2.1*  --  2.0* 1.7* <1.5* <1.5*   < > = values in this interval not displayed.    Liver Function Tests: Recent Labs  Lab 07/17/24 0458 07/17/24 1631 07/18/24 0312 07/18/24 1613 07/22/24 0308 07/22/24 2012 07/23/24 0534  AST 52*  --  47*  --   --   --   --   ALT 35  --  39  --   --   --   --   ALKPHOS 47  --  52  --   --   --   --   BILITOT 0.6  --  0.7  --   --   --   --   PROT 5.5*  --  6.0*  --   --   --   --   ALBUMIN 2.4*   < > 2.4*  2.5*   < > 1.7* <1.5* <1.5*   < > = values in this interval not displayed.   No results for input(s): LIPASE, AMYLASE in the last 168 hours.  No results for input(s): AMMONIA in the last 168 hours. CBC: Recent Labs    07/11/24 0228 07/12/24 0841 07/20/24 1106 07/21/24 0427 07/21/24 1513 07/22/24 0308 07/23/24 0534  HGB 8.9*   < > 11.9* 7.7* 7.1*  6.6* 7.1*  MCV 100.0   < >  --  95.3  --  95.3 89.6  VITAMINB12 1,029*  --   --   --   --   --   --   FOLATE >20.0  --   --   --   --   --   --   FERRITIN 762*  --   --   --   --   --   --   TIBC 227*  --   --   --   --   --   --   IRON 44  --   --   --   --   --   --   RETICCTPCT 1.7  --   --   --   --   --   --    < > = values in this interval not displayed.    Cardiac Enzymes: No results for input(s): CKTOTAL, CKMB, CKMBINDEX, TROPONINI in the last 168 hours. CBG: Recent Labs  Lab 07/22/24 1522 07/22/24 1957 07/22/24 2344 07/23/24 0334 07/23/24 0751  GLUCAP 110* 173* 146* 124* 117*    Iron Studies: No results for input(s): IRON, TIBC, TRANSFERRIN, FERRITIN in the last 72 hours. Studies/Results:   Medications: Infusions:  amiodarone 30 mg/hr (07/23/24 0700)   bivalirudin (ANGIOMAX) 250 mg in sodium chloride  0.9 % 500 mL (0.5 mg/mL) infusion 0.06 mg/kg/hr (07/23/24 0700)   fentaNYL  infusion INTRAVENOUS 50 mcg/hr (07/23/24 0700)   ketamine (KETALAR) adult infusion 0.5 mg/kg/hr (07/23/24 0700)   norepinephrine (LEVOPHED) Adult infusion Stopped (07/22/24 0110)   piperacillin-tazobactam (ZOSYN)  IV Stopped (07/23/24 0450)   TPN ADULT (ION) 30 mL/hr at 07/23/24 0700   vasopressin 0.03 Units/min (07/23/24 0700)    Scheduled Medications:  sodium chloride    Intravenous Once   acetaminophen   1,000 mg Per Tube Q8H   arformoterol   15 mcg Nebulization BID   Chlorhexidine  Gluconate Cloth  6 each Topical Daily   collagenase   Topical Daily  fentaNYL  (SUBLIMAZE ) injection  25-50 mcg Intravenous Once   insulin aspart  0-15 Units Subcutaneous Q4H   lidocaine -EPINEPHrine  20 mL Intradermal Once   midodrine  5 mg Per Tube TID WC   mouth rinse  15 mL Mouth Rinse Q2H   pantoprazole (PROTONIX) IV  40 mg Intravenous Q24H   polyethylene glycol  17 g Per Tube Daily   revefenacin  175 mcg Nebulization Daily   sodium chloride  flush  10 mL Intrapleural Q8H   sodium  chloride flush  10-40 mL Intracatheter Q12H    have reviewed scheduled and prn medications.  Physical Exam: General: Chronically ill-appearing, lying in bed, no distress Heart: Normal rate, no rub Lungs: Coarse breath sound bilateral.  Bilateral chest rise Abdomen:soft, Non-tender, non-distended Extremities: Trace peripheral dependent edema present  Jefferson Angelin Cutrone 07/23/2024,9:05 AM  LOS: 28 days

## 2024-07-23 NOTE — Progress Notes (Signed)
 PT Cancellation Note  Patient Details Name: Katie Waller MRN: [id number] DOB: 24-Mar-1946   Cancelled Treatment:    Reason Eval/Treat Not Completed: Medical issues which prohibited therapy; RN reports a-line accidentally removed and trying to hold pressure, also pt with labile BP today.  Will attempt another day.   Montie Portal 07/23/2024, 4:13 PM Micheline Portal, PT Acute Rehabilitation Services Office:[phone] 07/23/2024

## 2024-07-23 NOTE — Progress Notes (Signed)
 PHARMACY - ANTICOAGULATION CONSULT NOTE  Pharmacy Consult:  Bivalirudin  Indication: atrial fibrillation and internal jugular thombus   Allergies  Allergen Reactions   Ace Inhibitors Swelling   Diphenhydramine Other (See Comments)    Hyperactive/jittery    Doxycycline Hyclate Nausea And Vomiting   Codeine Nausea And Vomiting    Patient Measurements: Height: 5' 1 (154.9 cm) Weight: 75 kg (165 lb 5.5 oz) IBW/kg (Calculated) : 47.8 HEPARIN DW (KG): 63.1  Vital Signs: Temp: 98.4 F (36.9 C) (11/18 0336) Temp Source: Axillary (11/18 0336) BP: 142/42 (11/18 0331) Pulse Rate: 74 (11/18 0600)  Labs: Recent Labs    07/21/24 0427 07/21/24 1513 07/21/24 1559 07/22/24 0308 07/22/24 0915 07/22/24 2012 07/23/24 0534  HGB 7.7* 7.1*  --  6.6*  --   --  7.1*  HCT 24.3* 21.0*  --  20.3*  --   --  20.6*  PLT 205  --   --  PLATELET CLUMPS NOTED ON SMEAR, UNABLE TO ESTIMATE  --   --  151  APTT 66*  --    < > 90* 90* 86* 83*  CREATININE 0.72  --    < > 1.36*  --  2.10* 2.47*   < > = values in this interval not displayed.    Estimated Creatinine Clearance: 18.2 mL/min (A) (by C-G formula based on SCr of 2.47 mg/dL (H)).   Assessment: 55 YOF with recurrent Afib to resume bivalirudin.  She was previously on IV heparin and was switched to bivalirudin due to labile heparin levels.  Med was stopped because it was thought that Afib was secondary to critical illness, which resolved. However patient went back into Afib w/ RVR on 11/7 and bivalirudin has been resumed.  aPTT therapeutic at 83 sec.  No issue with infusion nor bleeding.  Hemoglobin improved post transfusion; platelet count WNL.  Goal of Therapy:  aPTT 50-85 seconds Monitor platelets by anticoagulation protocol: Yes   Plan:  Reduce bivalrudin slightly to 0.056 mg/kg/hr (Wt: 66 kg) aPTT BID Daily CBC  Zabdiel Dripps D. Lendell, PharmD, BCPS, BCCCP 07/23/2024, 8:20 AM

## 2024-07-23 NOTE — Plan of Care (Signed)
 Problem: Health Behavior/Discharge Planning: Goal: Ability to manage health-related needs will improve Outcome: Not Progressing   Problem: Clinical Measurements: Goal: Ability to maintain clinical measurements within normal limits will improve Outcome: Not Progressing Goal: Will remain free from infection Outcome: Not Progressing Goal: Diagnostic test results will improve Outcome: Not Progressing Goal: Respiratory complications will improve Outcome: Not Progressing Goal: Cardiovascular complication will be avoided Outcome: Not Progressing   Problem: Activity: Goal: Risk for activity intolerance will decrease Outcome: Not Progressing   Problem: Nutrition: Goal: Adequate nutrition will be maintained Outcome: Not Progressing   Problem: Coping: Goal: Level of anxiety will decrease Outcome: Not Progressing   Problem: Elimination: Goal: Will not experience complications related to bowel motility Outcome: Not Progressing Goal: Will not experience complications related to urinary retention Outcome: Not Progressing   Problem: Pain Managment: Goal: General experience of comfort will improve and/or be controlled Outcome: Not Progressing   Problem: Safety: Goal: Ability to remain free from injury will improve Outcome: Not Progressing   Problem: Skin Integrity: Goal: Risk for impaired skin integrity will decrease Outcome: Not Progressing   Problem: Education: Goal: Ability to describe self-care measures that may prevent or decrease complications (Diabetes Survival Skills Education) will improve Outcome: Not Progressing Goal: Individualized Educational Video(s) Outcome: Not Progressing   Problem: Coping: Goal: Ability to adjust to condition or change in health will improve Outcome: Not Progressing   Problem: Fluid Volume: Goal: Ability to maintain a balanced intake and output will improve Outcome: Not Progressing   Problem: Health Behavior/Discharge Planning: Goal:  Ability to identify and utilize available resources and services will improve Outcome: Not Progressing Goal: Ability to manage health-related needs will improve Outcome: Not Progressing   Problem: Metabolic: Goal: Ability to maintain appropriate glucose levels will improve Outcome: Not Progressing   Problem: Nutritional: Goal: Maintenance of adequate nutrition will improve Outcome: Not Progressing Goal: Progress toward achieving an optimal weight will improve Outcome: Not Progressing   Problem: Skin Integrity: Goal: Risk for impaired skin integrity will decrease Outcome: Not Progressing   Problem: Tissue Perfusion: Goal: Adequacy of tissue perfusion will improve Outcome: Not Progressing   Problem: Fluid Volume: Goal: Hemodynamic stability will improve Outcome: Not Progressing   Problem: Clinical Measurements: Goal: Diagnostic test results will improve Outcome: Not Progressing Goal: Signs and symptoms of infection will decrease Outcome: Not Progressing   Problem: Respiratory: Goal: Ability to maintain adequate ventilation will improve Outcome: Not Progressing   Problem: Activity: Goal: Ability to tolerate increased activity will improve Outcome: Not Progressing Goal: Will verbalize the importance of balancing activity with adequate rest periods Outcome: Not Progressing   Problem: Respiratory: Goal: Ability to maintain a clear airway will improve Outcome: Not Progressing Goal: Levels of oxygenation will improve Outcome: Not Progressing Goal: Ability to maintain adequate ventilation will improve Outcome: Not Progressing   Problem: Education: Goal: Knowledge of disease or condition will improve Outcome: Not Progressing Goal: Knowledge of the prescribed therapeutic regimen will improve Outcome: Not Progressing Goal: Individualized Educational Video(s) Outcome: Not Progressing   Problem: Activity: Goal: Ability to tolerate increased activity will  improve Outcome: Not Progressing Goal: Will verbalize the importance of balancing activity with adequate rest periods Outcome: Not Progressing   Problem: Respiratory: Goal: Ability to maintain a clear airway will improve Outcome: Not Progressing Goal: Levels of oxygenation will improve Outcome: Not Progressing Goal: Ability to maintain adequate ventilation will improve Outcome: Not Progressing   Problem: Education: Goal: Ability to demonstrate management of disease process will improve Outcome: Not Progressing  Goal: Ability to verbalize understanding of medication therapies will improve Outcome: Not Progressing Goal: Individualized Educational Video(s) Outcome: Not Progressing

## 2024-07-23 NOTE — Progress Notes (Signed)
 SLP Cancellation Note  Patient Details Name: Katie Waller MRN: [id number] DOB: 03-04-1946   Cancelled treatment:       Reason Eval/Treat Not Completed: Patient not medically ready. Pt remains on vent. Following for PMSV readiness.    Chastidy Ranker, Consuelo Fitch 07/23/2024, 11:14 AM

## 2024-07-23 NOTE — Progress Notes (Signed)
 PHARMACY - ANTICOAGULATION CONSULT NOTE  Pharmacy Consult:  Bivalirudin  Indication: atrial fibrillation and internal jugular thombus   Allergies  Allergen Reactions   Ace Inhibitors Swelling   Diphenhydramine Other (See Comments)    Hyperactive/jittery    Doxycycline Hyclate Nausea And Vomiting   Codeine Nausea And Vomiting    Patient Measurements: Height: 5' 1 (154.9 cm) Weight: 71.5 kg (157 lb 10.1 oz) IBW/kg (Calculated) : 47.8 HEPARIN DW (KG): 63.1  Vital Signs: Temp: 99.5 F (37.5 C) (11/18 1557) Temp Source: Axillary (11/18 1557) BP: 152/63 (11/18 1800) Pulse Rate: 91 (11/18 1800)  Labs: Recent Labs    07/21/24 0427 07/21/24 1513 07/21/24 1559 07/22/24 0308 07/22/24 0915 07/22/24 2012 07/23/24 0534 07/23/24 1659  HGB 7.7* 7.1*  --  6.6*  --   --  7.1*  --   HCT 24.3* 21.0*  --  20.3*  --   --  20.6*  --   PLT 205  --   --  PLATELET CLUMPS NOTED ON SMEAR, UNABLE TO ESTIMATE  --   --  151  --   APTT 66*  --    < > 90*   < > 86* 83* 108*  CREATININE 0.72  --    < > 1.36*  --  2.10* 2.47*  --    < > = values in this interval not displayed.    Estimated Creatinine Clearance: 17.8 mL/min (A) (by C-G formula based on SCr of 2.47 mg/dL (H)).   Assessment: 16 YOF with recurrent Afib to resume bivalirudin.  She was previously on IV heparin and was switched to bivalirudin due to labile heparin levels.  Med was stopped because it was thought that Afib was secondary to critical illness, which resolved. However patient went back into Afib w/ RVR on 11/7 and bivalirudin has been resumed.  aPTT therapeutic at 83 sec.  No issue with infusion nor bleeding.  Hemoglobin improved post transfusion; platelet count WNL.  PM Update: aPTT up 108 - supra-therapeutic on rate of 0.056 mg/kg/hr. Accumulating in setting of worsening renal function.    Goal of Therapy:  aPTT 50-85 seconds Monitor platelets by anticoagulation protocol: Yes   Plan:  Hold bivalrudin x 30 minutes,  then reduce bivalrudin to  0.39 mg/kg/hr (Wt: 66 kg) aPTT in 4hrs Daily CBC  Harlene Boga, PharmD, BCPS, BCCCP Clinical Pharmacist Please refer to [facility] for [facility] numbers 07/23/2024, 6:31 PM

## 2024-07-23 NOTE — Progress Notes (Signed)
 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 Code stroke called. On arrival to ED patient emergently intubated for acute respiratory distress and airway protection. CT Head with no ICH or large infarct, CTA head and neck with no LVO. CXR concerning for a Pneumonia with visible BL lung apices on CTA demonstrating infection/inflammation. Code stroke cancelled with presentation suspicious for sepsis. Patient received 30ml/kg crystalloid, pan cultured, and started empirically on Cefepime/Vanc/Azithromycin . Of note, family reports patient has recently not felt well and likely has had a cold. PCCM consulted for ICU admission.   On exam in ED, patient febrile to >101 and hypotensive, likely multifactorial in setting of presumed sepsis and recent sedation on induction with underlying heart disease. Norepinephrine gtt was initiated for MAPs < 65 and patient was transferred to ICU with plans to gain central access for vasopressor administration and arterial line placement for hemodynamic monitoring.  Pertinent  Medical History   Past Medical History:  Diagnosis Date   Abnormal finding on EKG 09/19/2013   Acute on chronic respiratory failure with hypercapnia (HCC)    Acute respiratory failure (HCC) 06/08/2016   Anxiety 11/09/2020   Arthritis    Asthma    Carotid bruit 11/09/2020   Chronic diastolic CHF (congestive heart failure) (HCC) 07/28/2017   Chronic obstructive pulmonary disease, unspecified (HCC) 11/09/2020   COPD, group D,  by GOLD 2017 classification (HCC) 09/17/2013   Cough 11/09/2020   Decreased estrogen level 11/09/2020   Diabetes (HCC)    Dyspnea    Edema 11/09/2020   Essential hypertension 09/18/2013   Goals of care, counseling/discussion    Hardening of the aorta (main artery of the heart) 11/09/2020   Heart disease    History of radiation therapy    Left Lung- 06/20/23-06/26/23- Dr. 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   Transient ischemic attack 11/09/2020   Venous stasis of both lower extremities 06/13/2016     Significant Hospital Events: Including procedures, antibiotic start and stop dates in addition to other pertinent events   10/21 Found unresponsive at home->ED>Code stroke>intubated for ARD/airway protection>CTH/CTA negative>Code stroke cancelled>Hypotensive>started on pressors>Admit to ICU  Arterial line placed 10/21 10/22 and 10/23 unsuccessful extubation 10/24: Despite maximal Precedex and fentanyl  pushes, unable to safely and comfortably clean the patient up so added prn Versed and started fentanyl  infusion. Rectal tube added for diarrhea.  10/24: 2/3 BC resulted in staph capitis/epidermitis, extubated on BiPAP 10/25: patient remained on BiPAP for 24 hours, when she came off of BiPAP, could not tolerate became tachypneic, tachycardic went into respiratory distress requiring endotracheal reintubation 10/29: unsuccessful SBT, went  briefly back into RVR 10/30: unsuccessful weaning from sedation as BP/HR increased considerably 11/2:  extubated to HFNC 11/8: progressively became more hypoxic with decreased respiratory drive prompting ABG this a.m. which revealed toxic hypercapnic respiratory failure.  Transferred back to ICU for impending intubation; tried draining pleural spaces but still tired out 11/10: trach 11/10: aggressive diuresis with Lasix  80 followed by 160 followed by 260 + metolazone and bicarb - no response>Nephrology consulted 11/11: nephro consult. LE weakness noted on exam with inability to move - neuro consulted, MRI C and T spine ordered. CT pulled yesterday, worsening ptx>2 R CT placed, ptx improved. CRRT started. Hypotensive>briefly on Levo 11/12: Levo stopped. MRI C spine>cervical stenosis.  11/13 Off TF.  11/14: Worsening hypotension in setting of tension pneumothorax. Resolved after chest tube was flushed with improved pressor requirement. Levophed now 5 mcg/min 11/15 back on TF 11/16 started zosyn for leukocytosis 11/17 TF held. Started on Ketamine. Received 1 pRBC 11/18 Art line came out   Interim History / Subjective:  O/N events: None   Daughter is present at bedside, patient is receiving HD. Patient is awake, able to move extremities spontaneously   Objective    Blood pressure (!) 142/42, pulse 74, temperature 98.4 F (36.9 C), temperature source Axillary, resp. rate (!) 25, height 5' 1 (1.549 m), weight 75 kg, SpO2 100%. CVP:  [12 mmHg] 12 mmHg  Vent Mode: PRVC FiO2 (%):  [40 %] 40 % Set Rate:  [25 bmp] 25 bmp Vt Set:  [380 mL] 380 mL PEEP:  [5 cmH20] 5 cmH20 Pressure Support:  [14 cmH20] 14 cmH20 Plateau Pressure:  [16 cmH20-21 cmH20] 16 cmH20   Intake/Output Summary (Last 24 hours) at 07/23/2024 0647 Last data filed at 07/23/2024 0600 Gross per 24 hour  Intake 3039.82 ml  Output 1530 ml  Net 1509.82 ml   Filed Weights   07/20/24 0500 07/22/24 0500 07/23/24 0500  Weight: 71.7 kg 74 kg 75 kg    Examination: General: ill appearing  HENT: NGT draining reddish brown  secretions  Neck: Trach in place  Lungs: coarse, on MV  Cardiovascular: RR Abdomen: No BS  Extremities: Cold extremities  Neuro: Awake  GU: No foley   UOP 0   Chest tube: 140 mL  I/O +1.2 L   Pertinent Labs: WBC 18.7 (20.4), Hgb 7.1 (6.6), PLT 151 Na 133, K 5.1, Scr 2.47 (2.10), Ca 7.8, Phos 6.5, Bicarb 28 w/ AG 19  aPTT 83   CXR 11/17  IMPRESSION: 1. Resolved right pneumothorax. 2. Improved subcutaneous emphysema.  Trach Aspirate 11/17 showed rare WBC, budding yeast   Resolved problem list  Acute metabolic encephalopathy  Human metapneumovirus pneumonia Hypokalemia Hypomagnesemia Hypoglycemia    Assessment and Plan   Acute on chronic hypoxic & hypercapnic respiratory failure s/p trach 11/9 Fluid overload - baseline weight per daughter around 146-150 lbs BL effusions: s/p thoracentesis 11/8 with 600 cc transudative fluid Right PTX s/p chest tube: #1 R pigtail CT pulled 11/10; #2 R CT remained; immediate recurring ptx>R pigtail re-placed>both CT to - . No air leak.>CT pulled 11/12.  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  NGT with reddish brown gastric secretion initially, but  now dark greenish-black   Shock - Multifactorial in setting of CRRT, sedation. Presume sepsis is contributing Previously in tension ptx but resolved - Wean off of Vasopressin today - Continue Levophed to maintain SBP > 100 and MAP > 55  - Continue Zosyn 11/16-  -last steroids 10/31; had been off steroids and pressors until NE restarted on 11/8   Afib with RVR, new onset, paroxysmal Chronic HFpEF  HTN CRRT-related  hypotension -IV amiodarone & bival -tele monitoring -monitor electrolytes and replete as needed    RIJ CVL associated blood clot -bival   AOC anemia  S/p 1 pRBC 11/17 - HGB 7.1 today - Transfuse if Hgb < 7 - TPN -no IV iron with concern for active infection   Anuric AKI Likely 2/2 sepsis/pneumonia and the use of antibiotics  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]-related hypotension-resolved - iHD this AM with UF, with plans for MWF schedule  - Midodrine 5 mg TID to help wean down pressors  - Bicarb gtt stopped - avoid nephrotoxins - strict I/O - renally dose meds, avoid nephrotoxic meds   Ileus  Last recorded LBM 11/14  -NGT to wall suction  -no enteral meds -Erythromycin day 2  -Given a dose of methylnaltrexone  -stop reglan , neostigmine -limiting opiates as able; ketamine for sedation   LE weakness and concern for paralysis--New finding on exam 11/11-able to wiggle toes on exam 11/12 AM-sensory intact to BLE Cervical stenosis -Appreciate NS's management; no plans for intervention of cervical stenosis given overall condition -con't to assess motor functionwith sedation being lightened -PT, OT as patient tolerates     Acute on chronic pain -fentanyl  gtt -adding ketamine for synergy and opiate sparing effect   GOC -Remains guarded prognosis in setting of poor lung mechanics and frailty. Nephrology concerned about dialysis dependence.   -Daughter and son have been clear throughout admission that they desire full scope of care.     Labs   CBC: Recent Labs  Lab 07/19/24 0515 07/19/24 2016 07/20/24 0432 07/20/24 1106 07/21/24 0427 07/21/24 1513 07/22/24 0308 07/23/24 0534  WBC 6.3  --  11.2*  --  17.3*  --  20.4* 18.7*  HGB 8.7*   < > 7.7* 11.9* 7.7* 7.1* 6.6* 7.1*  HCT 27.4*   < > 24.0* 35.0* 24.3* 21.0* 20.3* 20.6*  MCV 95.5  --  95.2  --  95.3  --  95.3 89.6  PLT 174  --  206  --  205  --  PLATELET CLUMPS NOTED ON SMEAR, UNABLE TO ESTIMATE 151   < > = values in this  interval not displayed.    Basic Metabolic Panel: Recent Labs  Lab 07/19/24 0515 07/19/24 1629 07/20/24 0432 07/20/24 1106 07/21/24 0427 07/21/24 1513 07/21/24 1559 07/22/24 0308 07/22/24 2012 07/23/24 0534  NA 134*   < > 133*   < > 136 136 136 135 131* 133*  K 4.3   < > 4.1   < > 3.8 4.0 4.4 4.8 5.1 5.1  CL 96*   < > 95*   < > 95*  --  97* 90* 84* 86*  CO2 26   < > 28   < > 29  --  28 33* 30 28  GLUCOSE 120*   < > 144*   < > 167*  --  187* 142* 213* 125*  BUN 32*   < > 16   < > 15  --  21 42* 64* 73*  CREATININE 1.17*   < > 0.83   < >  0.72  --  0.79 1.36* 2.10* 2.47*  CALCIUM  8.6*   < > 7.8*   < > 8.0*  --  8.0* 7.4* 7.0* 7.8*  MG 2.4  --  2.3  --  2.3  --   --  2.1  --  2.1  PHOS 1.9*   < > 2.9   < > 3.5  --  2.7 4.6 6.4* 6.5*   < > = values in this interval not displayed.   GFR: Estimated Creatinine Clearance: 18.2 mL/min (A) (by C-G formula based on SCr of 2.47 mg/dL (H)). Recent Labs  Lab 07/20/24 0432 07/21/24 0427 07/22/24 0308 07/23/24 0534  WBC 11.2* 17.3* 20.4* 18.7*    Liver Function Tests: Recent Labs  Lab 07/17/24 0458 07/17/24 1631 07/18/24 0312 07/18/24 1613 07/21/24 0427 07/21/24 1559 07/22/24 0308 07/22/24 2012 07/23/24 0534  AST 52*  --  47*  --   --   --   --   --   --   ALT 35  --  39  --   --   --   --   --   --   ALKPHOS 47  --  52  --   --   --   --   --   --   BILITOT 0.6  --  0.7  --   --   --   --   --   --   PROT 5.5*  --  6.0*  --   --   --   --   --   --   ALBUMIN 2.4*   < > 2.4*  2.5*   < > 2.1* 2.0* 1.7* <1.5* <1.5*   < > = values in this interval not displayed.   No results for input(s): LIPASE, AMYLASE in the last 168 hours. No results for input(s): AMMONIA in the last 168 hours.  ABG    Component Value Date/Time   PHART 7.394 07/21/2024 1513   PCO2ART 52.9 (H) 07/21/2024 1513   PO2ART 100 07/21/2024 1513   HCO3 32.4 (H) 07/21/2024 1513   TCO2 34 (H) 07/21/2024 1513   ACIDBASEDEF 2.0 07/19/2024 2016   O2SAT  98 07/21/2024 1513     Coagulation Profile: No results for input(s): INR, PROTIME in the last 168 hours.  Cardiac Enzymes: No results for input(s): CKTOTAL, CKMB, CKMBINDEX, TROPONINI in the last 168 hours.  HbA1C: Hgb A1c MFr Bld  Date/Time Value Ref Range Status  06/26/2024 04:47 AM 5.6 4.8 - 5.6 % Final    Comment:    (NOTE) Diagnosis of Diabetes The following HbA1c ranges recommended by the American Diabetes Association (ADA) may be used as an aid in the diagnosis of diabetes mellitus.  Hemoglobin             Suggested A1C NGSP%              Diagnosis  <5.7                   Non Diabetic  5.7-6.4                Pre-Diabetic  >6.4                   Diabetic  <7.0                   Glycemic control for  adults with diabetes.    09/17/2013 01:57 PM 6.1 (H) <5.7 % Final    Comment:    (NOTE)                                                                       According to the ADA Clinical Practice Recommendations for 2011, when HbA1c is used as a screening test:  >=6.5%   Diagnostic of Diabetes Mellitus           (if abnormal result is confirmed) 5.7-6.4%   Increased risk of developing Diabetes Mellitus References:Diagnosis and Classification of Diabetes Mellitus,Diabetes Care,2011,34(Suppl 1):S62-S69 and Standards of Medical Care in         Diabetes - 2011,Diabetes Care,2011,34 (Suppl 1):S11-S61.    CBG: Recent Labs  Lab 07/22/24 1150 07/22/24 1522 07/22/24 1957 07/22/24 2344 07/23/24 0334  GLUCAP 137* 110* 173* 146* 124*    Review of Systems:   As above   Past Medical History:  She,  has a past medical history of Abnormal finding on EKG (09/19/2013), Acute on chronic respiratory failure with hypercapnia (HCC), Acute respiratory failure (HCC) (06/08/2016), Anxiety (11/09/2020), Arthritis, Asthma, Carotid bruit (11/09/2020), Chronic diastolic CHF (congestive heart failure) (HCC) (07/28/2017), Chronic obstructive pulmonary  disease, unspecified (HCC) (11/09/2020), COPD, group D, by GOLD 2017 classification (HCC) (09/17/2013), Cough (11/09/2020), Decreased estrogen level (11/09/2020), Diabetes (HCC), Dyspnea, Edema (11/09/2020), Essential hypertension (09/18/2013), Goals of care, counseling/discussion, Hardening of the aorta (main artery of the heart) (11/09/2020), Heart disease, History of radiation therapy, Hypertension, Hypertensive heart failure (HCC) (11/09/2020), Hypoxia (11/09/2020), Insomnia (11/09/2020), Iron deficiency anemia (11/09/2020), Large liver (11/09/2020), Near syncope (09/17/2013), Osteoporosis (11/09/2020), Palliative care encounter, Prediabetes (09/19/2013), Pure hypercholesterolemia (11/09/2020), Raynaud's disease (11/09/2020), Sinus tachycardia (09/18/2013), Skin sensation disturbance (11/09/2020), Smoker (09/17/2013), 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   Surgical History:   Past Surgical History:  Procedure Laterality Date   BRONCHIAL BIOPSY  05/25/2023   Procedure: BRONCHIAL BIOPSIES;  Surgeon: Brenna Adine CROME, DO;  Location: [facility];  Service: Pulmonary;;   BRONCHIAL NEEDLE ASPIRATION BIOPSY  05/25/2023   Procedure: BRONCHIAL NEEDLE ASPIRATION BIOPSIES;  Surgeon: Brenna Adine CROME, DO;  Location: [facility];  Service: Pulmonary;;   FIDUCIAL MARKER PLACEMENT  05/25/2023   Procedure: FIDUCIAL MARKER PLACEMENT;  Surgeon: Brenna Adine CROME, DO;  Location: [facility];  Service: Pulmonary;;   KNEE SURGERY Left      Social History:   reports that she quit smoking about 7 years ago. Her smoking use included cigarettes. She started smoking about 7 years ago. She has a 55 pack-year smoking history. She has never used smokeless tobacco. She reports that she does not drink alcohol and does not use drugs.   Family History:  Her family history includes Hyperlipidemia in her father and mother;  Hypertension in her father and mother.   Allergies Allergies  Allergen Reactions   Ace Inhibitors Swelling   Diphenhydramine Other (See Comments)    Hyperactive/jittery    Doxycycline Hyclate Nausea And Vomiting   Codeine Nausea And Vomiting     Home Medications  Prior to Admission medications   Medication Sig Start Date End Date Taking? Authorizing Provider  acetaminophen  (TYLENOL ) 500 MG tablet Take  1,000 mg by mouth every 6 (six) hours as needed for moderate pain.   Yes [provider]  albuterol  (PROVENTIL  HFA;VENTOLIN  HFA) 108 (90 BASE) MCG/ACT inhaler Inhale 2 puffs into the lungs every 6 (six) hours as needed for wheezing or shortness of breath. 09/19/13  Yes Johnson, Clanford L, MD  aspirin  81 MG chewable tablet Chew 1 tablet (81 mg total) by mouth daily. 09/19/13  Yes Johnson, Clanford L, MD  atorvastatin  (LIPITOR) 10 MG tablet TAKE 1 TABLET BY MOUTH EVERY DAY 07/21/23  Yes Krasowski, Robert J, MD  benzonatate  (TESSALON ) 100 MG capsule Take 100 mg by mouth 3 (three) times daily as needed for cough.    Yes [provider]  Biotin 10 MG CAPS Take 10 mg by mouth daily.   Yes [provider]  budesonide -formoterol  (SYMBICORT ) 160-4.5 MCG/ACT inhaler Inhale 2 puffs into the lungs 2 (two) times daily. 08/23/19  Yes Mannam, Praveen, MD  calcium  carbonate (OSCAL) 1500 (600 Ca) MG TABS tablet Take 600 mg of elemental calcium  by mouth daily.   Yes [provider]  Cholecalciferol (VITAMIN D ) 50 MCG (2000 UT) tablet Take 2,000 Units by mouth daily.   Yes [provider]  diphenhydramine-acetaminophen  (TYLENOL  PM) 25-500 MG TABS tablet Take 1 tablet by mouth at bedtime.   Yes [provider]  furosemide  (LASIX ) 40 MG tablet Take 40 mg by mouth daily.   Yes [provider]  gabapentin (NEURONTIN) 300 MG capsule Take 900 mg by mouth at bedtime.   Yes [provider]  iron polysaccharides (NIFEREX) 150 MG capsule Take 150 mg  by mouth daily. 11/05/20  Yes [provider]  loratadine (CLARITIN) 10 MG tablet Take 10 mg by mouth daily as needed for allergies.   Yes [provider]  LORazepam  (ATIVAN ) 1 MG tablet Take 1 tablet (1 mg total) by mouth every 6 (six) hours as needed for anxiety. 08/07/17  Yes Drusilla Sabas RAMAN, MD  losartan  (COZAAR ) 50 MG tablet Take 50 mg by mouth daily.   Yes [provider]  Multiple Vitamin (MULTIVITAMIN WITH MINERALS) TABS tablet Take 1 tablet by mouth daily.   Yes [provider]  raloxifene  (EVISTA ) 60 MG tablet Take 60 mg by mouth every morning. 08/07/20  Yes [provider]  Tiotropium Bromide  Monohydrate (SPIRIVA  RESPIMAT) 2.5 MCG/ACT AERS Inhale 1 puff into the lungs daily.   Yes [provider]  traZODone  (DESYREL ) 100 MG tablet Take 1 tablet (100 mg total) by mouth at bedtime as needed for sleep. 08/07/17  Yes Drusilla Sabas RAMAN, MD     Critical care time:

## 2024-07-23 NOTE — Progress Notes (Signed)
 PHARMACY - TOTAL PARENTERAL NUTRITION CONSULT NOTE  Indication: Ileus, Intolerance to EN  Patient Measurements: Height: 5' 1 (154.9 cm) Weight: 75 kg (165 lb 5.5 oz) IBW/kg (Calculated) : 47.8 TPN AdjBW (KG): 53.6 Body mass index is 31.24 kg/m.  Assessment:  40 YOF presented on 10/21 with PNA/COPD exacerbation, hospitalization complicated by ARF requiring CRRT, PTX, cervical stenosis/paralysis and Afib RVR.  Patient initially tolerated TF and was having diarrhea requiring fiber.  Ileus developed around 11/12 and TF/fiber were held, started on Reglan , Relistor and neostigmine.  Trickle TF resumed on 11/15 and then held 11/17 given significant NG output and aspiration.  Pharmacy consulted to dose TPN given prolonged ileus.  Glucose / Insulin: hx preDM, A1c 5.6% - CBGs < 180 Used 8 units sSSI in the past 24 hrs Electrolytes: preHD labs - low Na/CL, CO2 normalized, Phos elevated (none in TPN), others WNL (K high normal - none in TPN) Renal: CRRT 11/11 >> 11/16, HD 11/18 - BUN up to 73 Hepatic: LFTs WNL except mildly elevated AST, lipase 99, tbili WNL, albumin < 1.5 Intake / Output; MIVF: NG , stool 10mL GI Imaging: none since TPN initiation GI Surgeries / Procedures: none since TPN initiation  Central access: PICC placed 07/12/24 TPN start date: 07/22/24  Nutritional Goals: Goal concentrated TPN rate is 60 mL/hr to provide 1738 kCal and 115g AA per day  RD Estimated Needs Total Energy Estimated Needs: 1700-1900 Total Protein Estimated Needs: 110-125 grams Total Fluid Estimated Needs: >/=1.5L  Current Nutrition:  TPN  Plan:  Increase concentrated TPN to goal rate 60 mL/hr at 1800 to provide 100% of needs Electrolytes in TPN: Na 29mEq/L, K 82mEq/L, Ca 88mEq/L, Mg 58mEq/L, Phos 16mmol/L, max CL Add standard MVI and trace elements to TPN (D/C BComplex) Continue moderate SSI Q4H Standard TPN labs Mon/Thurs.  Renal function panel BID and daily Mag per Renal - labs in AM  Samara Stankowski D.  [place], PharmD, BCPS, BCCCP 07/23/2024, 11:24 AM

## 2024-07-23 NOTE — TOC Progression Note (Signed)
 Transition of Care [facility]) - Progression Note    Patient Details  Name: Katie Waller MRN: [id number] Date of Birth: 07/02/1946  [facility] of [facility]) CM/SW Contact  Lauraine FORBES Saa, LCSWA Phone Number: 07/23/2024, 12:10 PM  Clinical Narrative:     12:10 PM Per progressions, patient is now on HD in addition to ketamine drip and TPN. CSW made [facility] liaison aware. TOC will continue to follow.  Expected Discharge Plan: Long Term Acute Care (LTAC) Barriers to Discharge: Continued Medical Work up, [employer] and Services In-house Referral: Clinical Social Work Discharge Planning Services: [employer] Consult Post Acute Care Choice: Long Term Acute Care (LTAC) Living arrangements for the past 2 months: Single Family Home                                       Social Drivers of Health (SDOH) Interventions SDOH Screenings   Food Insecurity: No Food Insecurity (07/04/2024)  Housing: Low Risk  (07/04/2024)  Transportation Needs: No Transportation Needs (07/04/2024)  Utilities: Not At Risk (07/04/2024)  Depression (PHQ2-9): Low Risk  (06/05/2023)  Social Connections: Patient Unable To Answer (07/09/2024)  Tobacco Use: Medium Risk (06/25/2024)    Readmission Risk Interventions     No data to display

## 2024-07-24 ENCOUNTER — Inpatient Hospital Stay (HOSPITAL_COMMUNITY)

## 2024-07-24 DIAGNOSIS — I998 Other disorder of circulatory system: Secondary | ICD-10-CM

## 2024-07-24 DIAGNOSIS — E43 Unspecified severe protein-calorie malnutrition: Secondary | ICD-10-CM

## 2024-07-24 DIAGNOSIS — N186 End stage renal disease: Secondary | ICD-10-CM

## 2024-07-24 DIAGNOSIS — I70223 Atherosclerosis of native arteries of extremities with rest pain, bilateral legs: Secondary | ICD-10-CM | POA: Insufficient documentation

## 2024-07-24 DIAGNOSIS — Z930 Tracheostomy status: Secondary | ICD-10-CM

## 2024-07-24 LAB — CULTURE, RESPIRATORY W GRAM STAIN

## 2024-07-24 LAB — GLUCOSE, CAPILLARY
Glucose-Capillary: 159 mg/dL — ABNORMAL HIGH (ref 70–99)
Glucose-Capillary: 184 mg/dL — ABNORMAL HIGH (ref 70–99)
Glucose-Capillary: 175 mg/dL — ABNORMAL HIGH (ref 70–99)
Glucose-Capillary: 117 mg/dL — ABNORMAL HIGH (ref 70–99)
Glucose-Capillary: 138 mg/dL — ABNORMAL HIGH (ref 70–99)
Glucose-Capillary: 122 mg/dL — ABNORMAL HIGH (ref 70–99)

## 2024-07-24 LAB — APTT
aPTT: 64 s — ABNORMAL HIGH (ref 24–36)
aPTT: 81 s — ABNORMAL HIGH (ref 24–36)
aPTT: 87 s — ABNORMAL HIGH (ref 24–36)
aPTT: 98 s — ABNORMAL HIGH (ref 24–36)

## 2024-07-24 LAB — RENAL FUNCTION PANEL
Albumin: <1.5 g/dL — ABNORMAL LOW (ref 3.5–5.0)
Anion gap: 16 — ABNORMAL HIGH (ref 5–15)
BUN: 53 mg/dL — ABNORMAL HIGH (ref 8–23)
CO2: 26 mmol/L (ref 22–32)
Calcium: 7.3 mg/dL — ABNORMAL LOW (ref 8.9–10.3)
Chloride: 91 mmol/L — ABNORMAL LOW (ref 98–111)
Creatinine, Ser: 1.97 mg/dL — ABNORMAL HIGH (ref 0.44–1.00)
GFR, Estimated: 26 mL/min — ABNORMAL LOW (ref >60)
Glucose, Bld: 221 mg/dL — ABNORMAL HIGH (ref 70–99)
Phosphorus: 3.2 mg/dL (ref 2.5–4.6)
Potassium: 3.1 mmol/L — ABNORMAL LOW (ref 3.5–5.1)
Sodium: 133 mmol/L — ABNORMAL LOW (ref 135–145)

## 2024-07-24 LAB — CBC
HCT: 18 % — ABNORMAL LOW (ref 36.0–46.0)
Hemoglobin: 6 g/dL — CL (ref 12.0–15.0)
MCH: 31.1 pg (ref 26.0–34.0)
MCHC: 33.3 g/dL (ref 30.0–36.0)
MCV: 93.3 fL (ref 80.0–100.0)
Platelets: 137 K/uL — ABNORMAL LOW (ref 150–400)
RBC: 1.93 MIL/uL — ABNORMAL LOW (ref 3.87–5.11)
RDW: 17 % — ABNORMAL HIGH (ref 11.5–15.5)
WBC: 16.1 K/uL — ABNORMAL HIGH (ref 4.0–10.5)
nRBC: 7.9 % — ABNORMAL HIGH (ref 0.0–0.2)

## 2024-07-24 LAB — MAGNESIUM: Magnesium: 1.6 mg/dL — ABNORMAL LOW (ref 1.7–2.4)

## 2024-07-24 LAB — PREPARE RBC (CROSSMATCH)

## 2024-07-24 LAB — POCT I-STAT 7, (LYTES, BLD GAS, ICA,H+H)
Acid-Base Excess: 5 mmol/L — ABNORMAL HIGH (ref 0.0–2.0)
Bicarbonate: 30.9 mmol/L — ABNORMAL HIGH (ref 20.0–28.0)
Calcium, Ion: 1.1 mmol/L — ABNORMAL LOW (ref 1.15–1.40)
HCT: 20 % — ABNORMAL LOW (ref 36.0–46.0)
Hemoglobin: 6.8 g/dL — CL (ref 12.0–15.0)
O2 Saturation: 98 %
Potassium: 3.3 mmol/L — ABNORMAL LOW (ref 3.5–5.1)
Sodium: 129 mmol/L — ABNORMAL LOW (ref 135–145)
TCO2: 32 mmol/L (ref 22–32)
pCO2 arterial: 53.3 mmHg — ABNORMAL HIGH (ref 32–48)
pH, Arterial: 7.37 (ref 7.35–7.45)
pO2, Arterial: 107 mmHg (ref 83–108)

## 2024-07-24 LAB — VAS US ABI WITH/WO TBI
Left ABI: 0
Right ABI: 0

## 2024-07-24 LAB — HEMOGLOBIN AND HEMATOCRIT, BLOOD
HCT: 24.6 % — ABNORMAL LOW (ref 36.0–46.0)
Hemoglobin: 8.3 g/dL — ABNORMAL LOW (ref 12.0–15.0)

## 2024-07-24 LAB — HEPATITIS B SURFACE ANTIBODY, QUANTITATIVE: Hep B S AB Quant (Post): <3.5 m[IU]/mL — ABNORMAL LOW

## 2024-07-24 MED ORDER — MIDODRINE HCL 5 MG PO TABS
10.0000 mg | ORAL_TABLET | Freq: Three times a day (TID) | ORAL | Status: DC
  Administered 2024-07-24 – 2024-07-27 (×9): 10 mg
  Filled 2024-07-24 (×9): qty 2

## 2024-07-24 MED ORDER — MAGNESIUM SULFATE IN D5W 1-5 GM/100ML-% IV SOLN
1.0000 g | Freq: Once | INTRAVENOUS | Status: AC
  Administered 2024-07-24: 1 g via INTRAVENOUS
  Filled 2024-07-24: qty 100

## 2024-07-24 MED ORDER — KETAMINE HCL 10 MG/ML IJ SOLN
0.5000 mg/kg/h | Status: DC
  Administered 2024-07-24 – 2024-07-27 (×4): 0.5 mg/kg/h via INTRAVENOUS
  Filled 2024-07-24 (×5): qty 100

## 2024-07-24 MED ORDER — TRACE MINERALS CU-MN-SE-ZN 300-55-60-3000 MCG/ML IV SOLN
INTRAVENOUS | Status: AC
  Filled 2024-07-24: qty 768

## 2024-07-24 MED ORDER — FENTANYL 2500MCG IN NS 250ML (10MCG/ML) PREMIX INFUSION
0.0000 ug/h | INTRAVENOUS | Status: DC
  Administered 2024-07-25: 50 ug/h via INTRAVENOUS
  Filled 2024-07-24: qty 250

## 2024-07-24 MED ORDER — PIPERACILLIN-TAZOBACTAM IN DEX 2-0.25 GM/50ML IV SOLN
2.2500 g | Freq: Three times a day (TID) | INTRAVENOUS | Status: AC
  Administered 2024-07-24 – 2024-07-28 (×12): 2.25 g via INTRAVENOUS
  Filled 2024-07-24 (×12): qty 50

## 2024-07-24 MED ORDER — FENTANYL CITRATE (PF) 50 MCG/ML IJ SOSY: 25.0000 ug | PREFILLED_SYRINGE | Freq: Once | INTRAMUSCULAR | Status: DC

## 2024-07-24 MED ORDER — DEXMEDETOMIDINE HCL IN NACL 400 MCG/100ML IV SOLN
0.0000 ug/kg/h | INTRAVENOUS | Status: DC
Start: 2024-07-24 — End: 2024-07-24

## 2024-07-24 MED ORDER — SODIUM CHLORIDE 0.9% IV SOLUTION: Freq: Once | INTRAVENOUS | Status: AC

## 2024-07-24 MED ORDER — INSULIN ASPART 100 UNIT/ML IJ SOLN
0.0000 [IU] | INTRAMUSCULAR | Status: DC
  Administered 2024-07-24: 3 [IU] via SUBCUTANEOUS
  Administered 2024-07-24: 4 [IU] via SUBCUTANEOUS
  Administered 2024-07-25 (×4): 3 [IU] via SUBCUTANEOUS
  Administered 2024-07-26: 4 [IU] via SUBCUTANEOUS
  Administered 2024-07-26: 3 [IU] via SUBCUTANEOUS
  Filled 2024-07-24 (×3): qty 3
  Filled 2024-07-24: qty 4
  Filled 2024-07-24: qty 3
  Filled 2024-07-24: qty 4
  Filled 2024-07-24 (×2): qty 3

## 2024-07-24 MED ORDER — CALCIUM GLUCONATE-NACL 2-0.675 GM/100ML-% IV SOLN
2.0000 g | Freq: Once | INTRAVENOUS | Status: AC
  Administered 2024-07-24: 2000 mg via INTRAVENOUS
  Filled 2024-07-24: qty 100

## 2024-07-24 MED ORDER — HYDROMORPHONE HCL 1 MG/ML IJ SOLN: 0.2500 mg | INTRAMUSCULAR | Status: DC | PRN

## 2024-07-24 MED ORDER — FENTANYL BOLUS VIA INFUSION
25.0000 ug | INTRAVENOUS | Status: DC | PRN
  Administered 2024-07-25 (×2): 50 ug via INTRAVENOUS
  Administered 2024-07-26: 100 ug via INTRAVENOUS
  Administered 2024-07-26 – 2024-07-27 (×2): 50 ug via INTRAVENOUS

## 2024-07-24 MED ORDER — POTASSIUM CHLORIDE 10 MEQ/50ML IV SOLN
10.0000 meq | INTRAVENOUS | Status: AC
  Administered 2024-07-24 (×3): 10 meq via INTRAVENOUS
  Filled 2024-07-24 (×3): qty 50

## 2024-07-24 NOTE — Progress Notes (Addendum)
 Katie Waller, MRN:  [id number], DOB:  27-Sep-1945, LOS: 29 ADMISSION DATE:  06/25/2024, CONSULTATION DATE:  06/25/2024 REFERRING MD:  Theodoro SAUNDERS, CHIEF COMPLAINT:  AMS, Sepsis, AR   History of Present Illness:  Katie Waller is a 78 yo female with past medical history significant for HTN, COPD, acute on chronic hypoxic, hypercapnic respiratory failure on 5-6L [place] baseline, diastolic heart failure, SCC Left lower lobe s/p radiation in September 2025, who presented via EMS after being found unresponsive at home. Code stroke called. On arrival to ED patient emergently intubated for acute respiratory distress and airway protection. CT Head with no ICH or large infarct, CTA head and neck with no LVO. CXR concerning for a Pneumonia with visible BL lung apices on CTA demonstrating infection/inflammation. Code stroke cancelled with presentation suspicious for sepsis. Patient received 30ml/kg crystalloid, pan cultured, and started empirically on Cefepime/Vanc/Azithromycin . Of note, family reports patient has recently not felt well and likely has had a cold. PCCM consulted for ICU admission.   On exam in ED, patient febrile to >101 and hypotensive, likely multifactorial in setting of presumed sepsis and recent sedation on induction with underlying heart disease. Norepinephrine gtt was initiated for MAPs < 65 and patient was transferred to ICU with plans to gain central access for vasopressor administration and arterial line placement for hemodynamic monitoring.  Pertinent  Medical History   Past Medical History:  Diagnosis Date   Abnormal finding on EKG 09/19/2013   Acute on chronic respiratory failure with hypercapnia (HCC)    Acute respiratory failure (HCC) 06/08/2016   Anxiety 11/09/2020   Arthritis    Asthma    Carotid bruit 11/09/2020   Chronic diastolic CHF (congestive heart failure) (HCC) 07/28/2017   Chronic obstructive pulmonary disease, unspecified (HCC) 11/09/2020   COPD, group D,  by GOLD 2017 classification (HCC) 09/17/2013   Cough 11/09/2020   Decreased estrogen level 11/09/2020   Diabetes (HCC)    Dyspnea    Edema 11/09/2020   Essential hypertension 09/18/2013   Goals of care, counseling/discussion    Hardening of the aorta (main artery of the heart) 11/09/2020   Heart disease    History of radiation therapy    Left Lung- 06/20/23-06/26/23- Dr. 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   Transient ischemic attack 11/09/2020   Venous stasis of both lower extremities 06/13/2016     Significant Hospital Events: Including procedures, antibiotic start and stop dates in addition to other pertinent events   10/21 Found unresponsive at home->ED>Code stroke>intubated for ARD/airway protection>CTH/CTA negative>Code stroke cancelled>Hypotensive>started on pressors>Admit to ICU  Arterial line placed 10/21 10/22 and 10/23 unsuccessful extubation 10/24: Despite maximal Precedex and fentanyl  pushes, unable to safely and comfortably clean the patient up so added prn Versed and started fentanyl  infusion. Rectal tube added for diarrhea.  10/24: 2/3 BC resulted in staph capitis/epidermitis, extubated on BiPAP 10/25: patient remained on BiPAP for 24 hours, when she came off of BiPAP, could not tolerate became tachypneic, tachycardic went into respiratory distress requiring endotracheal reintubation 10/29: unsuccessful SBT, went  briefly back into RVR 10/30: unsuccessful weaning from sedation as BP/HR increased considerably 11/2:  extubated to HFNC 11/8: progressively became more hypoxic with decreased respiratory drive prompting ABG this a.m. which revealed toxic hypercapnic respiratory failure.  Transferred back to ICU for impending intubation; tried draining pleural spaces but still tired out 11/10: trach 11/10: aggressive diuresis with Lasix  80 followed by 160 followed by 260 + metolazone and bicarb - no response>Nephrology consulted 11/11: nephro consult. LE weakness noted on exam with inability to move - neuro consulted, MRI C and T spine ordered. CT pulled yesterday, worsening ptx>2 R CT placed, ptx improved. CRRT started. Hypotensive>briefly on Levo 11/12: Levo stopped. MRI C spine>cervical stenosis.  11/13 Off TF.  11/14: Worsening hypotension in setting of tension pneumothorax. Resolved after chest tube was flushed with improved pressor requirement. Levophed now 5 mcg/min 11/15 back on TF 11/16 started zosyn for leukocytosis 11/17 TF held. Started on Ketamine. Received 1 pRBC 11/18 Art line came out  11/19 ABI ordered   Interim History / Subjective:  O/N events: Hgb 6.0   Patient awakens, does not track, does squeeze my fingers and moves b/l UE. Does not move b/l LE.   Objective    Blood pressure (!) 124/52, pulse 91, temperature 98.9 F (37.2 C), temperature source Axillary, resp. rate 17, height 5' 1 (1.549 m), weight 75.1 kg, SpO2 100%. CVP:  [10 mmHg-13 mmHg] 10 mmHg  Vent Mode: PRVC FiO2 (%):  [40 %] 40 % Set Rate:  [25 bmp] 25 bmp Vt Set:  [380 mL] 380 mL PEEP:  [5 cmH20] 5 cmH20 Pressure Support:  [12 cmH20] 12 cmH20 Plateau Pressure:  [16 cmH20-17 cmH20] 16 cmH20   Intake/Output Summary (Last 24 hours) at 07/24/2024 1641 Last data filed at 07/24/2024 1600 Gross per 24 hour  Intake 3211.17 ml  Output 200 ml  Net 3011.17 ml   Filed Weights   07/23/24 0740 07/23/24 1135 07/24/24 0500  Weight: 75 kg 71.5 kg 75.1 kg    Examination: General: ill appearing  HENT: NGT draining dark  greenish output  Neck: Trach in place  Lungs: coarse, on MV  Cardiovascular: irregular rhythm  Abdomen: No BS, moderately distended abdomen  Extremities: warm UE and cold LE. 2+ pitting edema LLE > RLE. Unable to palpate dorsal pedis or tibialis posterior pulses, even with doppler. There is hyperpigmentation (darkening) of the skin around the dorsal forefoot and ventral forefoot.  Neuro: opens eyes, does not track, but able to squeeze my fingers, does not move LE GU: No foley   UOP 0   Chest tube: < 10  I/O +292   Pertinent Labs: Na 133 (129), K 3.1 (3.3), Scr 1.97 (2.47)  Hgb 6.0 (6.8), WBC 16.1 (18.7) PLT 137 (151)  Mag 1.6   Trach Aspirate 11/17 showed few budding yeast   Resolved problem list  Acute metabolic encephalopathy  Human metapneumovirus pneumonia Hypokalemia Hypomagnesemia Hypoglycemia    Assessment and Plan   Acute on chronic hypoxic & hypercapnic respiratory failure s/p trach 11/9 Fluid overload - baseline weight per daughter around 146-150 lbs BL effusions: s/p thoracentesis 11/8 with 600 cc transudative fluid Right PTX s/p chest tube: #1 R pigtail CT pulled 11/10; #2 R CT remained; immediate recurring ptx>R pigtail re-placed>both CT to - . 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  - Tracheal Aspirate showed few yeast, pending   Shock - Multifactorial in setting of CRRT, sedation. Presume sepsis is contributing Previously in tension ptx but resolved - Off of levophed 11/18 and Vasopressin 11/19, able to  maintain MAP > 65  - con't ketamine, midodrine  - Continue Zosyn 11/16 for 7 days  -last steroids 10/31; had been off steroids and pressors until NE restarted on 11/8  Concern for Limb ischemia vs Severe PAD  On exam, bilateral cool feet, no dp or At pulses palpated and doppler was used without any findings of pulses. There is hyperpigmentation/darkening of the skin dorsal and ventral surfaces of the feet (images on the chart).  - STAT US  ABI/TBI with undetectable doppler waveforms bilateral PTA/DPA.  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 -Appreciate NS's management; no plans for intervention of cervical stenosis given overall condition -Assess motor function with sedation being lightened -PT, OT as patient tolerates    Afib with RVR, new onset, paroxysmal Chronic HFpEF  HTN CRRT-related hypotension -IV amiodarone & bival, Previously had difficulty with heparin dosing due to very labile heparin levels  -tele monitoring -monitor electrolytes and replete as needed    RIJ CVL associated blood clot - Continue bivalirudin   AOC anemia  S/p 1 pRBC 11/17, s/p 1 pRBC 11/19  - Monitor Hgb  - Transfuse if Hgb < 7 - Consider ESA per nephrology  -no IV iron with concern for active infection   Anuric AKI Likely 2/2 sepsis/pneumonia and the use of antibiotics  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]-related hypotension, now HD dependent  - Tolerated HD 11/18, plan for HD tomorrow  - Midodrine 10 mg TID, can increase it to Midodrine 15 TID  - Bicarb gtt stopped - avoid nephrotoxins - strict I/O - renally dose meds, avoid nephrotoxic meds   Ileus  Last recorded LBM 11/14  has been an ongoing issue; has not had adequate enteral nutrition in about 2 weeks  -NGT to wall  suction -Methylnaltrexone and erythromycin down cortrak 11/17 and 11/18 without any effect  -stop reglan , neostigmine -limiting opiates as able; ketamine for sedation    Acute on chronic pain -fentanyl  gtt; not really making progress with weaning this off at this point.  Enteral meds not likely to be helpful with no evidence of bowel function -Ketamine for synergy and opiate sparing effect.  Other options would be gabapentin if she is able to take oral or Precedex.   GOC -Remains guarded prognosis in setting of poor lung mechanics and frailty. Nephrology concerned about dialysis dependence.   -Daughter and son have been clear throughout admission that they desire full scope of care.     Labs   CBC: Recent Labs  Lab 07/20/24 0432 07/20/24 1106 07/21/24 0427 07/21/24 1513 07/22/24 0308 07/23/24 0534 07/24/24 0456 07/24/24 0523 07/24/24 1309  WBC 11.2*  --  17.3*  --  20.4* 18.7*  --  16.1*  --   HGB 7.7*   < > 7.7*   < >  6.6* 7.1* 6.8* 6.0* 8.3*  HCT 24.0*   < > 24.3*   < > 20.3* 20.6* 20.0* 18.0* 24.6*  MCV 95.2  --  95.3  --  95.3 89.6  --  93.3  --   PLT 206  --  205  --  PLATELET CLUMPS NOTED ON SMEAR, UNABLE TO ESTIMATE 151  --  137*  --    < > = values in this interval not displayed.    Basic Metabolic Panel: Recent Labs  Lab 07/20/24 0432 07/20/24 1106 07/21/24 0427 07/21/24 1513 07/21/24 1559 07/22/24 0308 07/22/24 2012 07/23/24 0534 07/24/24 0456 07/24/24 0523  NA 133*   < > 136   < > 136 135 131* 133* 129* 133*  K 4.1   < > 3.8   < > 4.4 4.8 5.1 5.1 3.3* 3.1*  CL 95*   < > 95*  --  97* 90* 84* 86*  --  91*  CO2 28   < > 29  --  28 33* 30 28  --  26  GLUCOSE 144*   < > 167*  --  187* 142* 213* 125*  --  221*  BUN 16   < > 15  --  21 42* 64* 73*  --  53*  CREATININE 0.83   < > 0.72  --  0.79 1.36* 2.10* 2.47*  --  1.97*  CALCIUM  7.8*   < > 8.0*  --  8.0* 7.4* 7.0* 7.8*  --  7.3*  MG 2.3  --  2.3  --   --  2.1  --  2.1  --  1.6*  PHOS 2.9   < > 3.5  --   2.7 4.6 6.4* 6.5*  --  3.2   < > = values in this interval not displayed.   GFR: Estimated Creatinine Clearance: 22.9 mL/min (A) (by C-G formula based on SCr of 1.97 mg/dL (H)). Recent Labs  Lab 07/21/24 0427 07/22/24 0308 07/23/24 0534 07/24/24 0523  WBC 17.3* 20.4* 18.7* 16.1*    Liver Function Tests: Recent Labs  Lab 07/18/24 0312 07/18/24 1613 07/21/24 1559 07/22/24 0308 07/22/24 2012 07/23/24 0534 07/24/24 0523  AST 47*  --   --   --   --   --   --   ALT 39  --   --   --   --   --   --   ALKPHOS 52  --   --   --   --   --   --   BILITOT 0.7  --   --   --   --   --   --   PROT 6.0*  --   --   --   --   --   --   ALBUMIN 2.4*  2.5*   < > 2.0* 1.7* <1.5* <1.5* <1.5*   < > = values in this interval not displayed.   No results for input(s): LIPASE, AMYLASE in the last 168 hours. No results for input(s): AMMONIA in the last 168 hours.  ABG    Component Value Date/Time   PHART 7.370 07/24/2024 0456   PCO2ART 53.3 (H) 07/24/2024 0456   PO2ART 107 07/24/2024 0456   HCO3 30.9 (H) 07/24/2024 0456   TCO2 32 07/24/2024 0456   ACIDBASEDEF 2.0 07/19/2024 2016   O2SAT 98 07/24/2024 0456     Coagulation Profile: No results for input(s): INR, PROTIME in the last 168 hours.  Cardiac Enzymes: No results for  input(s): CKTOTAL, CKMB, CKMBINDEX, TROPONINI in the last 168 hours.  HbA1C: Hgb A1c MFr Bld  Date/Time Value Ref Range Status  06/26/2024 04:47 AM 5.6 4.8 - 5.6 % Final    Comment:    (NOTE) Diagnosis of Diabetes The following HbA1c ranges recommended by the American Diabetes Association (ADA) may be used as an aid in the diagnosis of diabetes mellitus.  Hemoglobin             Suggested A1C NGSP%              Diagnosis  <5.7                   Non Diabetic  5.7-6.4                Pre-Diabetic  >6.4                   Diabetic  <7.0                   Glycemic control for                       adults with diabetes.    09/17/2013 01:57  PM 6.1 (H) <5.7 % Final    Comment:    (NOTE)                                                                       According to the ADA Clinical Practice Recommendations for 2011, when HbA1c is used as a screening test:  >=6.5%   Diagnostic of Diabetes Mellitus           (if abnormal result is confirmed) 5.7-6.4%   Increased risk of developing Diabetes Mellitus References:Diagnosis and Classification of Diabetes Mellitus,Diabetes Care,2011,34(Suppl 1):S62-S69 and Standards of Medical Care in         Diabetes - 2011,Diabetes Care,2011,34 (Suppl 1):S11-S61.    CBG: Recent Labs  Lab 07/23/24 2321 07/24/24 0335 07/24/24 0729 07/24/24 1115 07/24/24 1524  GLUCAP 161* 159* 184* 175* 117*    Review of Systems:   As above   Past Medical History:  She,  has a past medical history of Abnormal finding on EKG (09/19/2013), Acute on chronic respiratory failure with hypercapnia (HCC), Acute respiratory failure (HCC) (06/08/2016), Anxiety (11/09/2020), Arthritis, Asthma, Carotid bruit (11/09/2020), Chronic diastolic CHF (congestive heart failure) (HCC) (07/28/2017), Chronic obstructive pulmonary disease, unspecified (HCC) (11/09/2020), COPD, group D, by GOLD 2017 classification (HCC) (09/17/2013), Cough (11/09/2020), Decreased estrogen level (11/09/2020), Diabetes (HCC), Dyspnea, Edema (11/09/2020), Essential hypertension (09/18/2013), Goals of care, counseling/discussion, Hardening of the aorta (main artery of the heart) (11/09/2020), Heart disease, History of radiation therapy, Hypertension, Hypertensive heart failure (HCC) (11/09/2020), Hypoxia (11/09/2020), Insomnia (11/09/2020), Iron deficiency anemia (11/09/2020), Large liver (11/09/2020), Near syncope (09/17/2013), Osteoporosis (11/09/2020), Palliative care encounter, Prediabetes (09/19/2013), Pure hypercholesterolemia (11/09/2020), Raynaud's disease (11/09/2020), Sinus tachycardia (09/18/2013), Skin sensation disturbance (11/09/2020), Smoker  (09/17/2013), 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   Surgical History:   Past Surgical History:  Procedure Laterality Date   BRONCHIAL BIOPSY  05/25/2023   Procedure: BRONCHIAL BIOPSIES;  Surgeon: Brenna Adine CROME, DO;  Location: [facility];  Service: Pulmonary;;   BRONCHIAL NEEDLE ASPIRATION BIOPSY  05/25/2023   Procedure: BRONCHIAL NEEDLE ASPIRATION BIOPSIES;  Surgeon: Brenna Adine CROME, DO;  Location: [facility];  Service: Pulmonary;;   FIDUCIAL MARKER PLACEMENT  05/25/2023   Procedure: FIDUCIAL MARKER PLACEMENT;  Surgeon: Brenna Adine CROME, DO;  Location: [facility];  Service: Pulmonary;;   KNEE SURGERY Left      Social History:   reports that she quit smoking about 7 years ago. Her smoking use included cigarettes. She started smoking about 7 years ago. She has a 55 pack-year smoking history. She has never used smokeless tobacco. She reports that she does not drink alcohol and does not use drugs.   Family History:  Her family history includes Hyperlipidemia in her father and mother; Hypertension in her father and mother.   Allergies Allergies  Allergen Reactions   Ace Inhibitors Swelling   Diphenhydramine Other (See Comments)    Hyperactive/jittery    Doxycycline Hyclate Nausea And Vomiting   Codeine Nausea And Vomiting     Home Medications  Prior to Admission medications   Medication Sig Start Date End Date Taking? Authorizing Provider  acetaminophen  (TYLENOL ) 500 MG tablet Take 1,000 mg by mouth every 6 (six) hours as needed for moderate pain.   Yes [provider]  albuterol  (PROVENTIL  HFA;VENTOLIN  HFA) 108 (90 BASE) MCG/ACT inhaler Inhale 2 puffs into the lungs every 6 (six) hours as needed for wheezing or shortness of breath. 09/19/13  Yes Johnson, Clanford L, MD  aspirin  81 MG chewable tablet Chew 1 tablet (81 mg total) by mouth daily. 09/19/13  Yes Johnson,  Clanford L, MD  atorvastatin  (LIPITOR) 10 MG tablet TAKE 1 TABLET BY MOUTH EVERY DAY 07/21/23  Yes Krasowski, Robert J, MD  benzonatate  (TESSALON ) 100 MG capsule Take 100 mg by mouth 3 (three) times daily as needed for cough.    Yes [provider]  Biotin 10 MG CAPS Take 10 mg by mouth daily.   Yes [provider]  budesonide -formoterol  (SYMBICORT ) 160-4.5 MCG/ACT inhaler Inhale 2 puffs into the lungs 2 (two) times daily. 08/23/19  Yes Mannam, Praveen, MD  calcium  carbonate (OSCAL) 1500 (600 Ca) MG TABS tablet Take 600 mg of elemental calcium  by mouth daily.   Yes [provider]  Cholecalciferol (VITAMIN D ) 50 MCG (2000 UT) tablet Take 2,000 Units by mouth daily.   Yes [provider]  diphenhydramine-acetaminophen  (TYLENOL  PM) 25-500 MG TABS tablet Take 1 tablet by mouth at bedtime.   Yes [provider]  furosemide  (LASIX ) 40 MG tablet Take 40 mg by mouth daily.   Yes [provider]  gabapentin (NEURONTIN) 300 MG capsule Take 900 mg by mouth at bedtime.   Yes [provider]  iron polysaccharides (NIFEREX) 150 MG capsule Take 150 mg by mouth daily. 11/05/20  Yes [provider]  loratadine (CLARITIN) 10 MG tablet Take 10 mg by mouth daily as needed for allergies.   Yes [provider]  LORazepam  (ATIVAN ) 1 MG tablet Take 1 tablet (1 mg total) by mouth every 6 (six) hours as needed for anxiety. 08/07/17  Yes Drusilla Sabas RAMAN, MD  losartan  (COZAAR ) 50 MG tablet Take 50 mg by mouth daily.   Yes [provider]  Multiple Vitamin (MULTIVITAMIN WITH MINERALS) TABS tablet Take 1 tablet by mouth daily.   Yes [provider]  raloxifene  (EVISTA ) 60 MG tablet Take 60 mg by mouth every morning. 08/07/20  Yes [provider]  Tiotropium Bromide  Monohydrate (  SPIRIVA  RESPIMAT) 2.5 MCG/ACT AERS Inhale 1 puff into the lungs daily.   Yes [provider]  traZODone  (DESYREL ) 100 MG tablet Take 1 tablet  (100 mg total) by mouth at bedtime as needed for sleep. 08/07/17  Yes Drusilla Sabas RAMAN, MD     Critical care time:

## 2024-07-24 NOTE — Progress Notes (Signed)
 [facility] NEPHROLOGY PROGRESS NOTE  Assessment/ Plan: Pt is a 78 y.o. 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   # Severe anuric AKI: Baseline creatinine normal.  Likely ATN in the setting of sepsis.  Complicated by fluid overload.  CRRT on 11/11-16.  No signs of renal recovery at this time  -Concerns about fluid accumulation and BUN rising quickly - Tolerated hemodialysis on 10/18, plan for dialysis again tomorrow - Continue midodrine 5 mg 3 times daily and uptitrate as needed -Try to maintain on MWF schedule for dialysis -Long-term candidacy for dialysis questionable - Continue to monitor for renal recovery    # Acute on chronic hypoxic respiratory failure, metapneumovirus pneumonia: Initially treated with broad-spectrum antibiotics which was discontinued later.  Patient required tracheostomy on 11/9 and managing by pulmonary team.   # Bilateral pleural effusion, fluid overload: Patient required chest tube and has been managing by pulmonary team.  Ultrafiltration as tolerated  # Anemia: Hemoglobin below 7 today.  Transfuse as needed.  Consider ESA  # Hypoalbuminemia: Will provide IV albumin intermittently with dialysis as needed to help with hypotension  # Shock: Multifactorial.  Continue with there is as needed.  Continue midodrine and uptitrate as needed  Discussed with the patient's daughter, ICU team  Subjective: Daughter states patient had significant anxiety associated with dialysis yesterday but otherwise seem to tolerate fairly well with 2 L removed.  Continues with vasopressin.  Receiving transfusion this morning  Objective Vital signs in last  24 hours: Vitals:   07/24/24 0845 07/24/24 0900 07/24/24 0915 07/24/24 0930  BP: (!) 136/51 (!) 126/52 (!) 125/50   Pulse: 85 82 82 80  Resp: (!) 21 18 19 18   Temp: 98.5 F (36.9 C)     TempSrc: Axillary     SpO2: 100% 100% 100% 100%  Weight:      Height:       Weight change: 0 kg  Intake/Output Summary (Last 24 hours) at 07/24/2024 1001 Last data filed at 07/24/2024 0900 Gross per 24 hour  Intake 2235.43 ml  Output 2000 ml  Net 235.43 ml       Labs: RENAL PANEL Recent Labs  Lab 07/20/24 0432 07/20/24 1106 07/21/24 0427 07/21/24 1513 07/21/24 1559 07/22/24 0308 07/22/24 2012 07/23/24 0534 07/24/24 0456 07/24/24 0523  NA 133*   < > 136   < > 136 135 131* 133* 129* 133*  K 4.1   < > 3.8   < > 4.4 4.8 5.1 5.1 3.3* 3.1*  CL 95*   < > 95*  --  97* 90* 84* 86*  --  91*  CO2 28   < > 29  --  28 33* 30 28  --  26  GLUCOSE 144*   < > 167*  --  187* 142* 213* 125*  --  221*  BUN 16   < > 15  --  21 42* 64* 73*  --  53*  CREATININE 0.83   < > 0.72  --  0.79 1.36* 2.10* 2.47*  --  1.97*  CALCIUM  7.8*   < > 8.0*  --  8.0* 7.4* 7.0* 7.8*  --  7.3*  MG 2.3  --  2.3  --   --  2.1  --  2.1  --  1.6*  PHOS 2.9   < > 3.5  --  2.7 4.6 6.4* 6.5*  --  3.2  ALBUMIN 2.1*   < > 2.1*  --  2.0* 1.7* <1.5* <1.5*  --  <1.5*   < > = values in this interval not displayed.    Liver Function Tests: Recent Labs  Lab 07/18/24 0312 07/18/24 1613 07/22/24 2012 07/23/24 0534 07/24/24 0523  AST 47*  --   --   --   --   ALT 39  --   --   --   --   ALKPHOS 52  --   --   --   --   BILITOT 0.7  --   --   --   --   PROT 6.0*  --   --   --   --   ALBUMIN 2.4*  2.5*   < > <1.5* <1.5* <1.5*   < > = values in this interval not displayed.   No results for input(s): LIPASE, AMYLASE in the last 168 hours.  No results for input(s): AMMONIA in the last 168 hours. CBC: Recent Labs    07/11/24 0228 07/12/24 0841 07/21/24 1513 07/22/24 0308 07/23/24 0534 07/24/24 0456 07/24/24 0523   HGB 8.9*   < > 7.1* 6.6* 7.1* 6.8* 6.0*  MCV 100.0   < >  --  95.3 89.6  --  93.3  VITAMINB12 1,029*  --   --   --   --   --   --   FOLATE >20.0  --   --   --   --   --   --   FERRITIN 762*  --   --   --   --   --   --   TIBC 227*  --   --   --   --   --   --   IRON 44  --   --   --   --   --   --   RETICCTPCT 1.7  --   --   --   --   --   --    < > = values in this interval not displayed.    Cardiac Enzymes: No results for input(s): CKTOTAL, CKMB, CKMBINDEX, TROPONINI in the last 168 hours. CBG: Recent Labs  Lab 07/23/24 1535 07/23/24 1938 07/23/24 2321 07/24/24 0335 07/24/24 0729  GLUCAP 182* 149* 161* 159* 184*    Iron Studies: No results for input(s): IRON, TIBC, TRANSFERRIN, FERRITIN in the last 72 hours. Studies/Results:   Medications: Infusions:  amiodarone 30 mg/hr (07/24/24 0900)   bivalirudin (ANGIOMAX) 250 mg in sodium chloride  0.9 % 500 mL (0.5 mg/mL) infusion 0.039 mg/kg/hr (07/24/24 0900)   fentaNYL  infusion INTRAVENOUS 50 mcg/hr (07/24/24 0900)   ketamine (KETALAR) adult infusion 0.5 mg/kg/hr (07/24/24 0900)   norepinephrine (LEVOPHED) Adult infusion Stopped (07/23/24 1816)   piperacillin-tazobactam (ZOSYN)  IV Stopped (07/24/24 0433)   TPN ADULT (ION) 60 mL/hr at 07/24/24 0900   vasopressin 0.02 Units/min (07/24/24 0900)    Scheduled Medications:  sodium chloride    Intravenous Once   acetaminophen   1,000 mg Per Tube Q8H   arformoterol   15 mcg Nebulization BID   Chlorhexidine  Gluconate Cloth  6 each Topical Daily   collagenase   Topical Daily   fentaNYL  (SUBLIMAZE ) injection  25-50 mcg Intravenous Once   insulin aspart  0-20 Units Subcutaneous Q4H   lidocaine -EPINEPHrine  20 mL  Intradermal Once   midodrine  5 mg Per Tube TID WC   mouth rinse  15 mL Mouth Rinse Q2H   pantoprazole (PROTONIX) IV  40 mg Intravenous Q24H   polyethylene glycol  17 g Per Tube Daily   revefenacin  175 mcg Nebulization Daily   sodium chloride  flush  10 mL  Intrapleural Q8H   sodium chloride  flush  10-40 mL Intracatheter Q12H    have reviewed scheduled and prn medications.  Physical Exam: General: Chronically ill-appearing, lying in bed, no distress Heart: Normal rate, no rub Lungs: Coarse breath sound bilateral.  Bilateral chest rise Abdomen:soft, Non-tender, non-distended Extremities: Trace peripheral dependent edema present  Jefferson Declin Rajan 07/24/2024,10:01 AM  LOS: 29 days

## 2024-07-24 NOTE — Consult Note (Addendum)
 Hospital Consult    Reason for Consult: Bilateral leg ischemia Referring Physician: ICU MRN #:  [id number]  History of Present Illness: This is a 78 y.o. 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  In discussing with critical care her pressors were turned off today.  They have noted increased mottling to her lower extremities.  Unfortunately with her trach she is nonverbal and difficult to assess if she has any lower extremity complaints.  On my exam really unresponsive as a relates to any questions related to weakness or pain.  Past Medical History:  Diagnosis Date   Abnormal finding on EKG 09/19/2013   Acute on chronic respiratory failure with hypercapnia (HCC)    Acute respiratory failure (HCC) 06/08/2016   Anxiety 11/09/2020   Arthritis    Asthma    Carotid bruit 11/09/2020   Chronic diastolic CHF (congestive heart failure) (HCC) 07/28/2017   Chronic obstructive pulmonary disease, unspecified (HCC) 11/09/2020   COPD, group D, by GOLD 2017 classification (HCC) 09/17/2013   Cough 11/09/2020   Decreased estrogen level 11/09/2020   Diabetes (HCC)    Dyspnea    Edema 11/09/2020   Essential hypertension 09/18/2013   Goals of care, counseling/discussion    Hardening of the aorta (main artery of the heart) 11/09/2020   Heart disease    History of radiation therapy    Left Lung- 06/20/23-06/26/23- Dr. 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   Transient ischemic attack 11/09/2020   Venous stasis of both lower extremities 06/13/2016    Past Surgical History:  Procedure Laterality Date   BRONCHIAL BIOPSY  05/25/2023   Procedure: BRONCHIAL BIOPSIES;  Surgeon: Brenna Adine CROME, DO;  Location: [facility];  Service: Pulmonary;;   BRONCHIAL NEEDLE ASPIRATION BIOPSY  05/25/2023   Procedure: BRONCHIAL NEEDLE ASPIRATION BIOPSIES;  Surgeon: Brenna Adine CROME, DO;  Location: [facility];  Service: Pulmonary;;   FIDUCIAL MARKER PLACEMENT  05/25/2023   Procedure: FIDUCIAL MARKER PLACEMENT;  Surgeon: Brenna Adine CROME, DO;  Location: [facility];  Service: Pulmonary;;   KNEE SURGERY Left     Allergies  Allergen Reactions   Ace Inhibitors Swelling   Diphenhydramine Other (See Comments)    Hyperactive/jittery    Doxycycline Hyclate Nausea And Vomiting   Codeine Nausea And Vomiting    Prior to Admission medications   Medication Sig Start Date End Date Taking? Authorizing Provider  acetaminophen  (TYLENOL ) 500 MG tablet Take 1,000 mg by mouth every 6 (six) hours as needed for moderate pain.   Yes [provider]  albuterol  (PROVENTIL  HFA;VENTOLIN  HFA) 108 (90 BASE) MCG/ACT inhaler Inhale 2 puffs into the lungs every 6 (six) hours as needed for wheezing or shortness of breath. 09/19/13  Yes Johnson, Clanford L, MD  aspirin  81 MG chewable tablet Chew 1 tablet (81 mg total) by mouth daily. 09/19/13  Yes Johnson, Afton CROME, MD  atorvastatin  (LIPITOR)  10 MG tablet TAKE 1 TABLET BY MOUTH EVERY DAY 07/21/23  Yes Krasowski, Robert J, MD  benzonatate  (TESSALON ) 100 MG capsule Take 100 mg by mouth 3 (three) times daily as needed for cough.    Yes [provider]  Biotin 10 MG CAPS Take 10 mg by mouth daily.   Yes [provider]  budesonide -formoterol  (SYMBICORT ) 160-4.5 MCG/ACT inhaler Inhale 2 puffs into the lungs 2 (two)  times daily. 08/23/19  Yes Mannam, Praveen, MD  calcium  carbonate (OSCAL) 1500 (600 Ca) MG TABS tablet Take 600 mg of elemental calcium  by mouth daily.   Yes [provider]  Cholecalciferol (VITAMIN D ) 50 MCG (2000 UT) tablet Take 2,000 Units by mouth daily.   Yes [provider]  diphenhydramine-acetaminophen  (TYLENOL  PM) 25-500 MG TABS tablet Take 1 tablet by mouth at bedtime.   Yes [provider]  furosemide  (LASIX ) 40 MG tablet Take 40 mg by mouth daily.   Yes [provider]  gabapentin (NEURONTIN) 300 MG capsule Take 900 mg by mouth at bedtime.   Yes [provider]  iron polysaccharides (NIFEREX) 150 MG capsule Take 150 mg by mouth daily. 11/05/20  Yes [provider]  loratadine (CLARITIN) 10 MG tablet Take 10 mg by mouth daily as needed for allergies.   Yes [provider]  LORazepam  (ATIVAN ) 1 MG tablet Take 1 tablet (1 mg total) by mouth every 6 (six) hours as needed for anxiety. 08/07/17  Yes Drusilla Sabas RAMAN, MD  losartan  (COZAAR ) 50 MG tablet Take 50 mg by mouth daily.   Yes [provider]  Multiple Vitamin (MULTIVITAMIN WITH MINERALS) TABS tablet Take 1 tablet by mouth daily.   Yes [provider]  raloxifene  (EVISTA ) 60 MG tablet Take 60 mg by mouth every morning. 08/07/20  Yes [provider]  Tiotropium Bromide  Monohydrate (SPIRIVA  RESPIMAT) 2.5 MCG/ACT AERS Inhale 1 puff into the lungs daily.   Yes [provider]  traZODone  (DESYREL ) 100 MG tablet Take 1 tablet (100 mg total) by mouth at bedtime as needed for sleep. 08/07/17  Yes Drusilla Sabas RAMAN, MD    Social History   Socioeconomic History   Marital status: Divorced    Spouse name: Not on file   Number of children: Not on file   Years of education: Not on file   Highest education level: Not on file  Occupational History   Not on file  Tobacco Use   Smoking status: Former    Current packs/day: 0.00    Average packs/day: 1  pack/day for 55.0 years (55.0 ttl pk-yrs)    Types: Cigarettes    Start date: 07/22/2017    Quit date: 07/23/2017    Years since quitting: 7.0   Smokeless tobacco: Never  Vaping Use   Vaping status: Former  Substance and Sexual Activity   Alcohol use: No   Drug use: No   Sexual activity: Not Currently  Other Topics Concern   Not on file  Social History Narrative   ** Merged History Encounter **       Social Drivers of Health   Financial Resource Strain: Not on file  Food Insecurity: No Food Insecurity (07/04/2024)   Hunger Vital Sign    Worried About Running Out of Food in the Last Year: Never true    Ran Out of Food in the Last Year: Never true  Transportation Needs: No Transportation Needs (07/04/2024)   PRAPARE - Administrator, Civil Service (Medical):  No    Lack of Transportation (Non-Medical): No  Physical Activity: Not on file  Stress: Not on file  Social Connections: Patient Unable To Answer (07/09/2024)   Social Connection and Isolation Panel    Frequency of Communication with Friends and Family: Patient unable to answer    Frequency of Social Gatherings with Friends and Family: Patient unable to answer    Attends Religious Services: Patient unable to answer    Active Member of Clubs or Organizations: Patient unable to answer    Attends Banker Meetings: Patient unable to answer    Marital Status: Patient unable to answer  Intimate Partner Violence: Not At Risk (07/04/2024)   Humiliation, Afraid, Rape, and Kick questionnaire    Fear of Current or Ex-Partner: No    Emotionally Abused: No    Physically Abused: No    Sexually Abused: No     Family History  Problem Relation Age of Onset   Hyperlipidemia Mother    Hypertension Mother    Hypertension Father    Hyperlipidemia Father     ROS: [x]  Positive   [ ]  Negative   [ ]  All sytems reviewed and are negative  Cardiovascular: []  chest pain/pressure []  palpitations []  SOB lying  flat []  DOE []  pain in legs while walking []  pain in legs at rest []  pain in legs at night []  non-healing ulcers []  hx of DVT []  swelling in legs  Pulmonary: []  productive cough []  asthma/wheezing []  home O2  Neurologic: []  weakness in []  arms []  legs []  numbness in []  arms []  legs []  hx of CVA []  mini stroke [] difficulty speaking or slurred speech []  temporary loss of vision in one eye []  dizziness  Hematologic: []  hx of cancer []  bleeding problems []  problems with blood clotting easily  Endocrine:   []  diabetes []  thyroid  disease  GI []  vomiting blood []  blood in stool  GU: []  CKD/renal failure []  HD--[]  M/W/F or []  T/T/S []  burning with urination []  blood in urine  Psychiatric: []  anxiety []  depression  Musculoskeletal: []  arthritis []  joint pain  Integumentary: []  rashes []  ulcers  Constitutional: []  fever []  chills   Physical Examination  Vitals:   07/24/24 1600 07/24/24 1615  BP: (!) 124/52   Pulse: 84 91  Resp: (!) 24 17  Temp:    SpO2: 100% 100%   Body mass index is 31.28 kg/m.  General: Critically ill-appearing female Gait: Not observed HENT: Trach Pulmonary: course breath sounds Cardiac: irregular, without  Murmurs, rubs or gallops Abdomen:  soft, NT/ND Vascular Exam/Pulses: Difficult to appreciate femoral pulses No pedal pulses Extremities: Mottling to bilateral lower extremities up to the ankles  CBC    Component Value Date/Time   WBC 16.1 (H) 07/24/2024 0523   RBC 1.93 (L) 07/24/2024 0523   HGB 8.3 (L) 07/24/2024 1309   HCT 24.6 (L) 07/24/2024 1309   PLT 137 (L) 07/24/2024 0523   MCV 93.3 07/24/2024 0523   MCH 31.1 07/24/2024 0523   MCHC 33.3 07/24/2024 0523   RDW 17.0 (H) 07/24/2024 0523   LYMPHSABS 0.3 (L) 06/25/2024 0922   MONOABS 0.5 06/25/2024 0922   EOSABS 0.0 06/25/2024 0922   BASOSABS 0.0 06/25/2024 0922    BMET    Component Value Date/Time   NA 133 (L) 07/24/2024 0523   K 3.1 (L) 07/24/2024 0523    CL 91 (L) 07/24/2024 0523   CO2 26 07/24/2024 0523   GLUCOSE 221 (H) 07/24/2024 9476  BUN 53 (H) 07/24/2024 0523   CREATININE 1.97 (H) 07/24/2024 0523   CALCIUM  7.3 (L) 07/24/2024 0523   GFRNONAA 26 (L) 07/24/2024 0523   GFRAA 44 (L) 12/24/2018 1801    COAGS: Lab Results  Component Value Date   INR 0.9 06/25/2024   INR 0.97 09/17/2013     Non-Invasive Vascular Imaging:    CTA aortobifem pending   ASSESSMENT/PLAN: This is a 78 y.o. 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   ABIs were obtained today that were 0 bilaterally.  She does have mottling to both lower extremities below the ankle into her feet.  Discussed with her daughter I suspect a lot of this is related to her critical illness being on and off pressors now hospitalized nearly 1 month tomorrow.  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  Now severely deconditioned in the ICU with trach as well as requiring dialysis.  Has not been out of bed or walked during this prolonged hospitalization.  I think overall she has a very poor long-term prognosis.  Not a candidate for any emergent revascularization.  I do not feel femoral pulses so unclear whether she could even heal an above-knee amputation at this point.  Discussed with her daughter at bedside that above-knee amputation versus palliative care would be in her best interest.  Will order CTA aortobifem for further evaluation.  Vascular will follow.  Lonni DOROTHA Gaskins, MD [facility] Office: [phone]  Lonni JINNY Gaskins

## 2024-07-24 NOTE — Evaluation (Signed)
 Physical Therapy RE-Evaluation Patient Details Name: Katie Waller MRN: [id number] DOB: 1946-05-18 Today's Date: 07/24/2024  History of Present Illness  78 yo F adm 06/25/24 after found unresponsive in home. Pt hypotensive, septic, metapneumovirus PNA. VDRF 10/21-10/24, 10/25-11/2, 11/8 intubated, 11/10 trach. CRRT 11/11-11/16. 11/11 PTX with chest tube. PMHx: HTN, COPD, chronic 5-6L O2, CHF, NSCLC LLL s/p radiation 05/2024  Clinical Impression  Pt initially lethargic with eyes closed and opened eyes with slow and minimal response to UB cues and movement. Daughter present to confirm pt function and decline since admission and last evaluation. Pt without notable sensation to feet, no active muscle engagement of LB and grimace with LB movement. Daughter educated for PROm, positioning in chair position for core strengthening throughout the day for short periods due to sacral wound. Pt with significant change from prior function of walking and caring for herself, with current critical illness myopathy, decreased cardiopulmonary function and weakness she will benefit from acute therapy to progress strength and function to decrease burden of care as pt able to tolerate. LTACH recommended.   PS/CPAP at 40%, VSS        If plan is discharge home, recommend the following: Two people to help with bathing/dressing/bathroom;Two people to help with walking and/or transfers;Help with stairs or ramp for entrance;Assist for transportation;Direct supervision/assist for medications management   Can travel by private vehicle   No    Equipment Recommendations Wheelchair cushion (measurements PT);Hospital bed;Hoyer lift;Wheelchair (measurements PT)  Recommendations for Other Services       Functional Status Assessment Patient has had a recent decline in their functional status and/or demonstrates limited ability to make significant improvements in function in a reasonable and predictable amount of time      Precautions / Restrictions Precautions Precautions: Fall;Other (comment) Recall of Precautions/Restrictions: Impaired Precaution/Restrictions Comments: vent, trach, cortrak, no sensation bil feet, sacral wound      Mobility  Bed Mobility Overal bed mobility: Needs Assistance             General bed mobility comments: attempted to have pt pull trunk forward in bed, pt able to place hands on rail but total A +2 for weight shift anteriorly and unable to tolerate due to increased sacral pain    Transfers                        Ambulation/Gait                  Stairs            Wheelchair Mobility     Tilt Bed    Modified Rankin (Stroke Patients Only)       Balance                                             Pertinent Vitals/Pain Pain Assessment Pain Assessment: CPOT Facial Expression: Grimacing Body Movements: Absence of movements Muscle Tension: Relaxed Compliance with ventilator (intubated pts.): Tolerating ventilator or movement Vocalization (extubated pts.): N/A CPOT Total: 2 Pain Intervention(s): Limited activity within patient's tolerance, Monitored during session, Repositioned    Home Living Family/patient expects to be discharged to:: Private residence Living Arrangements: Children Available Help at Discharge: Family Type of Home: House Home Access: Stairs to enter   [employer] of Steps: 2 on front, on back stairs with ramp   Home  Layout: One level Home Equipment: Hand held shower head;Shower seat;Grab bars - tub/shower;Rollator (4 wheels);Transport chair;Other (comment) Additional Comments: daughter works from home with in office hours on monday. son lives next door and can (A) during the times that daughter is out of home    Prior Function Prior Level of Function : Needs assist             Mobility Comments: walks with rollator 5-6L [place] at home baseline ADLs Comments: daughter cooks,  showers on her own, daughter helps with hair     Extremity/Trunk Assessment   Upper Extremity Assessment Upper Extremity Assessment: Defer to OT evaluation    Lower Extremity Assessment RLE Deficits / Details: pt with no withdrawal to pain with nail bed pressure, grimace with hip/knee flexion, no active movement or even trace activation noted with movement LLE Deficits / Details: pt with no withdrawal to pain with nail bed pressure, grimace with hip/knee flexion, no active movement or even trace activation noted with movement    Cervical / Trunk Assessment Cervical / Trunk Assessment: Kyphotic  Communication   Communication Communication: Impaired Factors Affecting Communication: Trach/intubated    Cognition Arousal: Lethargic Behavior During Therapy: Flat affect   PT - Cognitive impairments: Difficult to assess Difficult to assess due to: Tracheostomy                     PT - Cognition Comments: pt with eyes closed on arrival, ultimately opened with stimulation, very limited response with head nods, grimace with LB movement Following commands: Impaired Following commands impaired: Follows one step commands with increased time     Cueing Cueing Techniques: Verbal cues, Gestural cues, Tactile cues     General Comments      Exercises General Exercises - Lower Extremity Ankle Circles/Pumps: PROM, Both, 10 reps, Supine Short Arc Quad: PROM, Both, 10 reps, Seated Heel Slides: PROM, Both, 10 reps, Supine Hip ABduction/ADduction: PROM, Both, 10 reps, Supine   Assessment/Plan    PT Assessment Patient needs continued PT services  PT Problem List Decreased strength;Decreased activity tolerance;Decreased balance;Decreased mobility;Cardiopulmonary status limiting activity;Decreased knowledge of use of DME;Decreased safety awareness;Decreased range of motion;Decreased cognition;Decreased coordination;Impaired sensation;Decreased skin integrity       PT Treatment  Interventions DME instruction;Gait training;Patient/family education;Functional mobility training;Therapeutic activities;Therapeutic exercise;Balance training;Wheelchair mobility training;Neuromuscular re-education    PT Goals (Current goals can be found in the Care Plan section)  Acute Rehab PT Goals Patient Stated Goal: improve strength and function PT Goal Formulation: With patient/family Time For Goal Achievement: 08/07/24 Potential to Achieve Goals: Fair    Frequency Min 2X/week     Co-evaluation PT/OT/SLP Co-Evaluation/Treatment: Yes Reason for Co-Treatment: Complexity of the patient's impairments (multi-system involvement) PT goals addressed during session: Mobility/safety with mobility;Strengthening/ROM         AM-PAC PT 6 Clicks Mobility  Outcome Measure Help needed turning from your back to your side while in a flat bed without using bedrails?: Total Help needed moving from lying on your back to sitting on the side of a flat bed without using bedrails?: Total Help needed moving to and from a bed to a chair (including a wheelchair)?: Total Help needed standing up from a chair using your arms (e.g., wheelchair or bedside chair)?: Total Help needed to walk in hospital room?: Total Help needed climbing 3-5 steps with a railing? : Total 6 Click Score: 6    End of Session   Activity Tolerance: Patient limited by fatigue Patient left: in bed;with  call bell/phone within reach;with family/visitor present Nurse Communication: Mobility status;Need for lift equipment PT Visit Diagnosis: Other abnormalities of gait and mobility (R26.89);Muscle weakness (generalized) (M62.81);Other symptoms and signs involving the nervous system (R29.898)    Time: 8893-8871 PT Time Calculation (min) (ACUTE ONLY): 22 min   Charges:   PT Evaluation $PT Re-evaluation: 1 Re-eval   PT General Charges $$ ACUTE PT VISIT: 1 Visit         Lenoard SQUIBB, PT Acute Rehabilitation Services Office:  [phone]   Lenoard NOVAK Jailyne Chieffo 07/24/2024, 1:47 PM

## 2024-07-24 NOTE — Progress Notes (Signed)
 PHARMACY - TOTAL PARENTERAL NUTRITION CONSULT NOTE  Indication: Ileus, Intolerance to EN  Patient Measurements: Height: 5' 1 (154.9 cm) Weight: 75.1 kg (165 lb 9.1 oz) IBW/kg (Calculated) : 47.8 TPN AdjBW (KG): 53.6 Body mass index is 31.28 kg/m.  Assessment:  [name] presented on 10/21 with PNA/COPD exacerbation, hospitalization complicated by ARF requiring CRRT, PTX, cervical stenosis/paralysis and Afib RVR.  Patient initially tolerated TF and was having diarrhea requiring fiber.  Ileus developed around 11/12 and TF/fiber were held, started on Reglan , Relistor and neostigmine.  Trickle TF resumed on 11/15 and then held 11/17 given significant NG output and aspiration.  Pharmacy consulted to dose TPN given prolonged ileus.  Glucose / Insulin: hx preDM, A1c 5.6% - CBGs borderline Used 10 units sSSI in the past 24 hrs Electrolytes: post HD labs - low Na/CL, K low at 3.1 (goal >/= 4), low iCa, Mag low at 1.6 (goal >/= 2), others WNL (Phos normalized) Renal: CRRT 11/11 >> 11/16, HD 11/18 - BUN down to 53 Hepatic: LFTs WNL except mildly elevated AST, lipase 99, tbili WNL, albumin < 1.5 Intake / Output; MIVF: NG 0mL, stool 0mL GI meds: PRN erythromycin and Relistor GI Imaging: none since TPN initiation GI Surgeries / Procedures: none since TPN initiation  Central access: PICC placed 07/12/24 TPN start date: 07/22/24  Nutritional Goals: Goal concentrated TPN rate is 60 mL/hr to provide 1738 kCal and 115g AA per day  RD Estimated Needs Total Energy Estimated Needs: 1700-1900 Total Protein Estimated Needs: 110-125 grams Total Fluid Estimated Needs: >/=1.5L  Current Nutrition:  TPN  Plan:  Continue concentrated TPN at goal rate 60 mL/hr at 1800 to provide 100% of needs Electrolytes in TPN: increase Na to 110 mEq/L, add low dose K 7mEq/L (= 10 mEq/day), Ca 31mEq/L, add low dose Mg 2mEq/L, Phos 0mmol/L, max CL Add standard MVI and trace elements to TPN (D/C BComplex) Increase to  resistant SSI Q4H - may need to add insulin to TPN Ca gluc 2gm IV x 1 Mag sulfate 1gm IV x 1 KCL x 3 runs Standard TPN labs Mon/Thurs.  Renal function panel BID and daily Mag per Renal - labs in AM  Katie Waller, PharmD, BCPS, BCCCP 07/24/2024, 10:02 AM

## 2024-07-24 NOTE — Progress Notes (Signed)
 PHARMACY - ANTICOAGULATION CONSULT NOTE  Pharmacy Consult:  Bivalirudin   Indication: atrial fibrillation and internal jugular thombus   Allergies  Allergen Reactions   Ace Inhibitors Swelling   Diphenhydramine Other (See Comments)    Hyperactive/jittery    Doxycycline Hyclate Nausea And Vomiting   Codeine Nausea And Vomiting    Patient Measurements: Height: 5' 1 (154.9 cm) Weight: 75.1 kg (165 lb 9.1 oz) IBW/kg (Calculated) : 47.8 HEPARIN  DW (KG): 63.1  Vital Signs: Temp: 98.6 F (37 C) (11/19 1939) Temp Source: Axillary (11/19 1939) BP: 139/57 (11/19 1800) Pulse Rate: 81 (11/19 1800)  Labs: Recent Labs    07/22/24 0308 07/22/24 0915 07/22/24 2012 07/23/24 0534 07/23/24 1659 07/24/24 0456 07/24/24 0523 07/24/24 1309 07/24/24 1548 07/24/24 1651  HGB 6.6*  --   --  7.1*  --  6.8* 6.0* 8.3*  --   --   HCT 20.3*  --   --  20.6*  --  20.0* 18.0* 24.6*  --   --   PLT PLATELET CLUMPS NOTED ON SMEAR, UNABLE TO ESTIMATE  --   --  151  --   --  137*  --   --   --   APTT 90*   < > 86* 83*   < >  --  81*  --  98* 87*  CREATININE 1.36*  --  2.10* 2.47*  --   --  1.97*  --   --   --    < > = values in this interval not displayed.    Estimated Creatinine Clearance: 22.9 mL/min (A) (by C-G formula based on SCr of 1.97 mg/dL (H)).   Assessment: 54 YOF with recurrent Afib to resume bivalirudin .  She was previously on IV heparin  and was switched to bivalirudin  due to labile heparin  levels.  Med was stopped because it was thought that Afib was secondary to critical illness, which resolved. However patient went back into Afib w/ RVR on 11/7 and bivalirudin  has been resumed.  aPTT supratherapeutic at 87 sec. Hg 8.3  Goal of Therapy:  aPTT 50-85 seconds Monitor platelets by anticoagulation protocol: Yes   Plan:  Reduce bivalrudin slightly to 0.028 mg/kg/hr (Wt: 66 kg) aPTT BID Daily CBC  Prentice Poisson, PharmD Clinical Pharmacist **Pharmacist phone directory can now be  found on [email or web address] ([email or web address]).  Listed under [facility].

## 2024-07-24 NOTE — Progress Notes (Signed)
 PHARMACY - ANTICOAGULATION CONSULT NOTE  Pharmacy Consult:  Bivalirudin  Indication: atrial fibrillation and internal jugular thombus   Allergies  Allergen Reactions   Ace Inhibitors Swelling   Diphenhydramine Other (See Comments)    Hyperactive/jittery    Doxycycline Hyclate Nausea And Vomiting   Codeine Nausea And Vomiting    Patient Measurements: Height: 5' 1 (154.9 cm) Weight: 75.1 kg (165 lb 9.1 oz) IBW/kg (Calculated) : 47.8 HEPARIN DW (KG): 63.1  Vital Signs: Temp: 96.8 F (36 C) (11/19 0336) Temp Source: Axillary (11/19 0336) BP: 134/50 (11/19 0600) Pulse Rate: 75 (11/19 0600)  Labs: Recent Labs    07/22/24 0308 07/22/24 0915 07/22/24 2012 07/23/24 0534 07/23/24 1659 07/23/24 2336 07/24/24 0456 07/24/24 0523  HGB 6.6*  --   --  7.1*  --   --  6.8* 6.0*  HCT 20.3*  --   --  20.6*  --   --  20.0* 18.0*  PLT PLATELET CLUMPS NOTED ON SMEAR, UNABLE TO ESTIMATE  --   --  151  --   --   --  137*  APTT 90*   < > 86* 83* 108* 64*  --  81*  CREATININE 1.36*  --  2.10* 2.47*  --   --   --  1.97*   < > = values in this interval not displayed.    Estimated Creatinine Clearance: 22.9 mL/min (A) (by C-G formula based on SCr of 1.97 mg/dL (H)).   Assessment: 30 YOF with recurrent Afib to resume bivalirudin.  She was previously on IV heparin and was switched to bivalirudin due to labile heparin levels.  Med was stopped because it was thought that Afib was secondary to critical illness, which resolved. However patient went back into Afib w/ RVR on 11/7 and bivalirudin has been resumed.  aPTT therapeutic at 81 sec.  No issue with infusion nor bleeding.  Hemoglobin back down to 6; platelet count also trending down.  Goal of Therapy:  aPTT 50-85 seconds Monitor platelets by anticoagulation protocol: Yes   Plan:  Reduce bivalrudin slightly to 0.032 mg/kg/hr (Wt: 66 kg) aPTT BID Daily CBC  Joanie Duprey D. [place], PharmD, BCPS, BCCCP 07/24/2024, 10:05 AM

## 2024-07-24 NOTE — Evaluation (Signed)
 Occupational Therapy Evaluation Patient Details Name: Katie Waller MRN: [id number] DOB: 1946/08/26 Today's Date: 07/24/2024   History of Present Illness   78 yo F adm 06/25/24 after found unresponsive in home. Pt hypotensive, septic, metapneumovirus PNA. VDRF 10/21-10/24, 10/25-11/2, 11/8 intubated, 11/10 trach. CRRT 11/11-11/16. 11/11 PTX with chest tube. PMHx: HTN, COPD, chronic 5-6L O2, CHF, NSCLC LLL s/p radiation 05/2024     Clinical Impressions Re-eval completed due to change in medical status during admission. PLOF and home set-up confirmed with daughter who was present and supportive throughout session. Pt now presents with decreased activity tolerance, generalized B UE weakness, decreased B UE coordination; decreased B UE proprioception, increased lethargy, increased fatigue, decreased balance, and decreased safety and independence with functional tasks. Pt currently demonstrating ability to complete ADLs with Max to Total assist of +2 and Total assist +2 for bed mobility. Pt made good attempts to participate during session but was limited by fatigue. Pt will benefit from acute OT services to address deficits and increase safety and independence with functional tasks. Post acute discharge, pt will benefit from [facility] with skilled OT services to maximize rehab potential.   PS/CPAP at 40%, VSS      If plan is discharge home, recommend the following:   Two people to help with walking and/or transfers;Two people to help with bathing/dressing/bathroom;Assistance with cooking/housework;Assistance with feeding;Direct supervision/assist for medications management;Direct supervision/assist for financial management;Assist for transportation;Help with stairs or ramp for entrance     Functional Status Assessment   Patient has had a recent decline in their functional status and demonstrates the ability to make significant improvements in function in a reasonable and predictable amount of  time.     Equipment Recommendations   [employer];Wheelchair cushion (measurements OT);Wheelchair (measurements OT);BSC/3in1     Recommendations for Other Services         Precautions/Restrictions   Precautions Precautions: Fall;Other (comment) Recall of Precautions/Restrictions: Impaired Precaution/Restrictions Comments: vent, trach, cortrak, no sensation bil feet, sacral wound     Mobility Bed Mobility Overal bed mobility: Needs Assistance             General bed mobility comments: attempted to have pt pull trunk forward in bed, pt able to place hands on rail but total A +2 for weight shift anteriorly and unable to tolerate due to increased sacral pain    Transfers                          Balance Overall balance assessment: Needs assistance     Sitting balance - Comments: Pt requiring Total assist +2 to come to/maintain long sitting in the bed with pt holding on to and making good attempt to pull self up on B bed rails with B UE                                   ADL either performed or assessed with clinical judgement   ADL Overall ADL's : Needs assistance/impaired Eating/Feeding: NPO   Grooming: Wash/dry hands;Wash/dry face;Maximal assistance;Total assistance;Bed level (constant cues for participation)   Upper Body Bathing: Maximal assistance;Total assistance;Cueing for compensatory techniques;Bed level (constant cues for participation);Total assistance;+2 for safety/equipment;    Lower Body Bathing: Total assistanceTotal assistance;+2 for physical assistance;+2 for safety/equipment;    Upper Body Dressing : Maximal assistance;Total assistance;Bed level;Cueing for compensatory techniques (cues for participation)Total assistance;+2 for safety/equipment;    Lower  Body Dressing: Total assistanceTotal assistance;+2 for physical assistance;+2 for safety/equipment;      Toilet Transfer Details (indicate cue type and reason): unable  at this time Toileting- Architect and Hygiene: Total assistance               Vision Baseline Vision/History: 1 Wears glasses (readers) Patient Visual Report:  (Per daughter report; pt unable to report) Additional Comments: To be further tested in functional contexts in furture sessions; pt with noted difficulty focusing on image on her phone, but with OT uncertain if this was due to a visual deficit or due to fatigue/desire to wrap up session     Perception         Praxis         Pertinent Vitals/Pain Pain Assessment Pain Assessment: Faces Faces Pain Scale: Hurts little more Pain Location: B LE with PROM Pain Descriptors / Indicators: Discomfort, Grimacing, Guarding Pain Intervention(s): Limited activity within patient's tolerance, Monitored during session, Repositioned     Extremity/Trunk Assessment Upper Extremity Assessment Upper Extremity Assessment: Left hand dominant;RUE deficits/detail;LUE deficits/detail;Generalized weakness RUE Deficits / Details: generalized weakness; AAROM limited to approx. 90 degrees secondary to lines and leads; decreased coordination; decreased proprioception; difficult to fully assess sensation, but appears otherwise WFL; edmatous hand, wrist, forearm (L > R) RUE Sensation: decreased proprioception RUE Coordination: decreased fine motor;decreased gross motor LUE Deficits / Details: generalized weakness; AAROM limited to approx. 90 degrees secondary to lines and leads; decreased coordination; decreased proprioception; difficult to fully assess sensation, but appears otherwise WFL; edmatous hand, wrist, forearm (L > R) LUE Sensation: decreased proprioception LUE Coordination: decreased gross motor;decreased fine motor   Lower Extremity Assessment Lower Extremity Assessment: Defer to PT evaluation RLE Deficits / Details: pt with no withdrawal to pain with nail bed pressure, grimace with hip/knee flexion, no active movement or even  trace activation noted with movement LLE Deficits / Details: pt with no withdrawal to pain with nail bed pressure, grimace with hip/knee flexion, no active movement or even trace activation noted with movement   Cervical / Trunk Assessment Cervical / Trunk Assessment: Kyphotic   Communication Communication Communication: Impaired Factors Affecting Communication: Trach/intubated   Cognition Arousal: Lethargic Behavior During Therapy: Flat affect Cognition: Difficult to assess Difficult to assess due to: Tracheostomy           OT - Cognition Comments: Pt with eyes closed on arrival, but opened eyes with with stimulation and largely kept eyes open throughout session. Pt with very limited responses to questions with head nods/shakes.                 Following commands: Impaired Following commands impaired: Follows one step commands with increased time     Cueing  General Comments   Cueing Techniques: Verbal cues;Gestural cues;Tactile cues  Pt's daughter present and supportive throughout session. RN present during a portion of session.   Exercises Exercises: General Upper Extremity, Hand exercises General Exercises - Upper Extremity Shoulder Flexion: AAROM, Both, 5 reps, Supine, Strengthening (with HOB elevated; to maintin joint integrity; to increase activity tolerance) Shoulder Extension: AAROM, Both, 5 reps, Supine, Strengthening (with HOB elevated; to maintin joint integrity; to increase activity tolerance) Elbow Flexion: AAROM, Both, 5 reps, Supine, Strengthening (with HOB elevated; to maintin joint integrity; to increase activity tolerance) Elbow Extension: AAROM, Strengthening, Both, 5 reps, Supine (with HOB elevated; to maintin joint integrity; to increase activity tolerance) Wrist Flexion: AAROM, Both, 5 reps, Strengthening, Supine (with HOB elevated; to maintin joint integrity;  to increase activity tolerance) Wrist Extension: AAROM, Strengthening, Both, 5 reps,  Supine (with HOB elevated; to maintin joint integrity; to increase activity tolerance) Digit Composite Flexion: AAROM, Strengthening, Both, 5 reps, Supine (with HOB elevated; to maintin joint integrity; to increase activity tolerance) Composite Extension: AAROM, Strengthening, Both, 5 reps, Supine (with HOB elevated; to maintin joint integrity; to increase activity tolerance) Hand Exercises Forearm Supination: AAROM, Strengthening, Both, 5 reps, Supine (with HOB elevated; to maintin joint integrity; to increase activity tolerance) Forearm Pronation: AAROM, Strengthening, Both, 5 reps, Supine (with HOB elevated; to maintin joint integrity; to increase activity tolerance)   Shoulder Instructions      Home Living Family/patient expects to be discharged to:: Private residence Living Arrangements: Children (daughter) Available Help at Discharge: Family;Available 24 hours/day (lives with daughter; son lives next door) Type of Home: House Home Access: Stairs to enter [employer] of Steps: 2 on front, on back stairs with ramp   Home Layout: One level     Bathroom Shower/Tub: Producer, Television/film/video: Handicapped height Bathroom Accessibility: Yes   Home Equipment: Hand held shower head;Shower seat;Grab bars - tub/shower;Rollator (4 wheels);Transport chair;Other (comment) (adjustable bed)   Additional Comments: daughter works from home with in office hours on monday. son lives next door and can (A) during the times that daughter is out of home; prior level of function and home set-up per daughter report and chart review      Prior Functioning/Environment Prior Level of Function : Needs assist             Mobility Comments: walks with rollator 5-6L  at home baseline ADLs Comments: Largely Ind to [place] with ADLs,, but daughter assists with hair; daughter cooks and completes home management tasks    OT Problem List: Decreased strength;Decreased activity  tolerance;Impaired balance (sitting and/or standing);Decreased safety awareness;Decreased knowledge of use of DME or AE;Decreased knowledge of precautions;Cardiopulmonary status limiting activity;Decreased coordination;Impaired UE functional use   OT Treatment/Interventions: Self-care/ADL training;Therapeutic exercise;Energy conservation;DME and/or AE instruction;Therapeutic activities;Patient/family education;Balance training;Manual therapy      OT Goals(Current goals can be found in the care plan section)   Acute Rehab OT Goals Patient Stated Goal: pt unable to state OT Goal Formulation: With patient/family (daughter) Time For Goal Achievement: 08/07/24 Potential to Achieve Goals: Fair   OT Frequency:  Min 2X/week    Co-evaluation PT/OT/SLP Co-Evaluation/Treatment: Yes Reason for Co-Treatment: Complexity of the patient's impairments (multi-system involvement) PT goals addressed during session: Mobility/safety with mobility;Strengthening/ROM OT goals addressed during session: ADL's and self-care;Strengthening/ROM      AM-PAC OT 6 Clicks Daily Activity     Outcome Measure Help from another person eating meals?: Total (currently NPO) Help from another person taking care of personal grooming?: A Lot Help from another person toileting, which includes using toliet, bedpan, or urinal?: Total Help from another person bathing (including washing, rinsing, drying)?: A Lot Help from another person to put on and taking off regular upper body clothing?: A Lot Help from another person to put on and taking off regular lower body clothing?: Total 6 Click Score: 9   End of Session Nurse Communication: Mobility status  Activity Tolerance: Patient limited by fatigue Patient left: in bed;with call bell/phone within reach;with family/visitor present  OT Visit Diagnosis: Other abnormalities of gait and mobility (R26.89);Muscle weakness (generalized) (M62.81);Ataxia, unspecified (R27.0)                 Time: 8893-8871 OT Time Calculation (min): 22 min Charges:  OT General Charges $OT Visit: 1 Visit OT Evaluation $OT Re-eval: 1 Re-eval  Margarie Rockey HERO., OTR/L, MA Acute Rehab [phone]   Margarie FORBES Horns 07/24/2024, 2:32 PM

## 2024-07-25 ENCOUNTER — Inpatient Hospital Stay (HOSPITAL_COMMUNITY)

## 2024-07-25 LAB — COMPREHENSIVE METABOLIC PANEL WITH GFR
ALT: 337 U/L — ABNORMAL HIGH (ref 0–44)
AST: 136 U/L — ABNORMAL HIGH (ref 15–41)
Albumin: <1.5 g/dL — ABNORMAL LOW (ref 3.5–5.0)
Alkaline Phosphatase: 146 U/L — ABNORMAL HIGH (ref 38–126)
Anion gap: 16 — ABNORMAL HIGH (ref 5–15)
BUN: 93 mg/dL — ABNORMAL HIGH (ref 8–23)
CO2: 22 mmol/L (ref 22–32)
Calcium: 8.3 mg/dL — ABNORMAL LOW (ref 8.9–10.3)
Chloride: 92 mmol/L — ABNORMAL LOW (ref 98–111)
Creatinine, Ser: 2.84 mg/dL — ABNORMAL HIGH (ref 0.44–1.00)
GFR, Estimated: 17 mL/min — ABNORMAL LOW (ref >60)
Glucose, Bld: 149 mg/dL — ABNORMAL HIGH (ref 70–99)
Potassium: 3.9 mmol/L (ref 3.5–5.1)
Sodium: 130 mmol/L — ABNORMAL LOW (ref 135–145)
Total Bilirubin: 0.7 mg/dL (ref 0.0–1.2)
Total Protein: 5.3 g/dL — ABNORMAL LOW (ref 6.5–8.1)

## 2024-07-25 LAB — TYPE AND SCREEN
ABO/RH(D): A POS
Antibody Screen: NEGATIVE
Unit division: 0
Unit division: 0

## 2024-07-25 LAB — CBC
HCT: 26.3 % — ABNORMAL LOW (ref 36.0–46.0)
Hemoglobin: 8.8 g/dL — ABNORMAL LOW (ref 12.0–15.0)
MCH: 30 pg (ref 26.0–34.0)
MCHC: 33.5 g/dL (ref 30.0–36.0)
MCV: 89.8 fL (ref 80.0–100.0)
Platelets: 165 K/uL (ref 150–400)
RBC: 2.93 MIL/uL — ABNORMAL LOW (ref 3.87–5.11)
RDW: 17 % — ABNORMAL HIGH (ref 11.5–15.5)
WBC: 19.6 K/uL — ABNORMAL HIGH (ref 4.0–10.5)
nRBC: 2.8 % — ABNORMAL HIGH (ref 0.0–0.2)

## 2024-07-25 LAB — GLUCOSE, CAPILLARY
Glucose-Capillary: 114 mg/dL — ABNORMAL HIGH (ref 70–99)
Glucose-Capillary: 120 mg/dL — ABNORMAL HIGH (ref 70–99)
Glucose-Capillary: 125 mg/dL — ABNORMAL HIGH (ref 70–99)
Glucose-Capillary: 138 mg/dL — ABNORMAL HIGH (ref 70–99)
Glucose-Capillary: 141 mg/dL — ABNORMAL HIGH (ref 70–99)
Glucose-Capillary: 150 mg/dL — ABNORMAL HIGH (ref 70–99)

## 2024-07-25 LAB — PHOSPHORUS: Phosphorus: 3.4 mg/dL (ref 2.5–4.6)

## 2024-07-25 LAB — BPAM RBC
Blood Product Expiration Date: 202512062359
Blood Product Expiration Date: 202512122359
ISSUE DATE / TIME: 202511170545
ISSUE DATE / TIME: 202511190828
Unit Type and Rh: 6200
Unit Type and Rh: 6200

## 2024-07-25 LAB — APTT
aPTT: 84 s — ABNORMAL HIGH (ref 24–36)
aPTT: 46 s — ABNORMAL HIGH (ref 24–36)

## 2024-07-25 LAB — MAGNESIUM: Magnesium: 1.9 mg/dL (ref 1.7–2.4)

## 2024-07-25 MED ORDER — IOHEXOL 350 MG/ML SOLN
100.0000 mL | Freq: Once | INTRAVENOUS | Status: AC | PRN
  Administered 2024-07-25: 100 mL via INTRAVENOUS

## 2024-07-25 MED ORDER — TRACE MINERALS CU-MN-SE-ZN 300-55-60-3000 MCG/ML IV SOLN
INTRAVENOUS | Status: AC
  Filled 2024-07-25: qty 768

## 2024-07-25 MED ORDER — ALBUMIN HUMAN 25 % IV SOLN
25.0000 g | Freq: Once | INTRAVENOUS | Status: AC
  Administered 2024-07-25: 25 g via INTRAVENOUS

## 2024-07-25 NOTE — Progress Notes (Signed)
 [facility] NEPHROLOGY PROGRESS NOTE  Assessment/ Plan: Pt is a 78 y.o. 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   # Severe anuric AKI: Baseline creatinine normal.  Likely ATN in the setting of sepsis.  Complicated by fluid overload.  CRRT on 11/11-16.  No signs of renal recovery at this time  -Concerns about fluid accumulation and BUN rising quickly - Tolerated hemodialysis on 10/18, plan for dialysis again today with more aggressive UF - Continue midodrine  5 mg 3 times daily and uptitrate as needed -Try to maintain on MWF schedule for dialysis -Long-term candidacy for dialysis questionable - Continue to monitor for renal recovery -Discussed we may have to return to CRRT if we fall behind on fluid. We discussed this is not an ideal solution and unlikely to change overall trajectory. Family remains focused on aggressive measures.    # Acute on chronic hypoxic respiratory failure, metapneumovirus pneumonia: Initially treated with broad-spectrum antibiotics which was discontinued later.  Patient required tracheostomy on 11/9 and managing by pulmonary team.   # Anemia: Continue with transfusions intermittently.  Could consider ESA  # Hypoalbuminemia: Will provide IV albumin  intermittently with dialysis as needed to help with hypotension  # Shock: Multifactorial.  Continue with there is as needed.  Continue midodrine  and uptitrate as needed  # Hyponatremia: Associate with fluid accumulation.  Management with dialysis  Discussed with the patient's daughter, ICU team  Subjective: Worsening mottling of the lower extremities.  Had CT scan to evaluate today.  Limited  surgical options.  No significant changes.  Worsening edema.  Objective Vital signs in last 24 hours: Vitals:   07/25/24 0915 07/25/24 0930 07/25/24 0945 07/25/24 1000  BP: (!) 122/52 (!) 117/52 (!) 121/52 (!) 120/53  Pulse: 87 86 86 85  Resp: 17 19 19 17   Temp:      TempSrc:      SpO2: 100% 100% 100% 100%  Weight:      Height:       Weight change: -0.9 kg  Intake/Output Summary (Last 24 hours) at 07/25/2024 1051 Last data filed at 07/25/2024 1000 Gross per 24 hour  Intake 3087.86 ml  Output 425 ml  Net 2662.86 ml       Labs: RENAL PANEL Recent Labs  Lab 07/21/24 0427 07/21/24 1513 07/22/24 0308 07/22/24 2012 07/23/24 0534 07/24/24 0456 07/24/24 0523 07/25/24 0421  NA 136   < > 135 131* 133* 129* 133* 130*  K 3.8   < > 4.8 5.1 5.1 3.3* 3.1* 3.9  CL 95*   < > 90* 84* 86*  --  91* 92*  CO2 29   < > 33* 30 28  --  26 22  GLUCOSE 167*   < > 142* 213* 125*  --  221* 149*  BUN 15   < > 42* 64* 73*  --  53* 93*  CREATININE 0.72   < > 1.36* 2.10* 2.47*  --  1.97* 2.84*  CALCIUM  8.0*   < > 7.4* 7.0* 7.8*  --  7.3* 8.3*  MG 2.3  --  2.1  --  2.1  --  1.6* 1.9  PHOS 3.5   < > 4.6 6.4* 6.5*  --  3.2  3.4  ALBUMIN 2.1*   < > 1.7* <1.5* <1.5*  --  <1.5* <1.5*   < > = values in this interval not displayed.    Liver Function Tests: Recent Labs  Lab 07/23/24 0534 07/24/24 0523 07/25/24 0421  AST  --   --  136*  ALT  --   --  337*  ALKPHOS  --   --  146*  BILITOT  --   --  0.7  PROT  --   --  5.3*  ALBUMIN <1.5* <1.5* <1.5*   No results for input(s): LIPASE, AMYLASE in the last 168 hours.  No results for input(s): AMMONIA in the last 168 hours. CBC: Recent Labs    07/11/24 0228 07/12/24 0841 07/23/24 0534 07/24/24 0456 07/24/24 0523 07/24/24 1309 07/25/24 0421  HGB 8.9*   < > 7.1* 6.8* 6.0* 8.3* 8.8*  MCV 100.0   < > 89.6  --  93.3  --  89.8  VITAMINB12 1,029*  --   --   --   --   --   --   FOLATE >20.0  --   --   --   --   --   --   FERRITIN  762*  --   --   --   --   --   --   TIBC 227*  --   --   --   --   --   --   IRON 44  --   --   --   --   --   --   RETICCTPCT 1.7  --   --   --   --   --   --    < > = values in this interval not displayed.    Cardiac Enzymes: No results for input(s): CKTOTAL, CKMB, CKMBINDEX, TROPONINI in the last 168 hours. CBG: Recent Labs  Lab 07/24/24 1524 07/24/24 1937 07/24/24 2322 07/25/24 0319 07/25/24 0737  GLUCAP 117* 138* 122* 120* 150*    Iron Studies: No results for input(s): IRON, TIBC, TRANSFERRIN, FERRITIN in the last 72 hours. Studies/Results:   Medications: Infusions:  amiodarone 30 mg/hr (07/25/24 1000)   bivalirudin (ANGIOMAX) 250 mg in sodium chloride  0.9 % 500 mL (0.5 mg/mL) infusion 0.028 mg/kg/hr (07/25/24 1000)   fentaNYL  infusion INTRAVENOUS 50 mcg/hr (07/25/24 1000)   ketamine (KETALAR) adult infusion 0.5 mg/kg/hr (07/25/24 1000)   norepinephrine (LEVOPHED) Adult infusion Stopped (07/23/24 1816)   piperacillin-tazobactam (ZOSYN)  IV Stopped (07/25/24 0411)   TPN ADULT (ION) 60 mL/hr at 07/25/24 1000   vasopressin Stopped (07/24/24 1039)    Scheduled Medications:  sodium chloride    Intravenous Once   arformoterol   15 mcg Nebulization BID   Chlorhexidine  Gluconate Cloth  6 each Topical Daily   collagenase   Topical Daily   fentaNYL  (SUBLIMAZE ) injection  25-50 mcg Intravenous Once   insulin aspart  0-20 Units Subcutaneous Q4H   lidocaine -EPINEPHrine  20 mL Intradermal Once   midodrine  10 mg Per Tube TID WC   mouth rinse  15 mL Mouth Rinse Q2H   pantoprazole (PROTONIX) IV  40 mg Intravenous Q24H   polyethylene glycol  17 g Per Tube Daily   revefenacin  175 mcg Nebulization Daily   sodium chloride  flush  10 mL Intrapleural Q8H   sodium chloride  flush  10-40 mL Intracatheter Q12H    I have reviewed scheduled and prn medications.  Physical Exam: General: Chronically ill-appearing, lying in bed, no distress Heart: Normal  rate, no  rub Lungs: Coarse breath sound bilateral.  Bilateral chest rise Abdomen:soft, Non-tender, non-distended Extremities: 1+ peripheral dependent edema present  Katie Waller 07/25/2024,10:51 AM  LOS: 30 days

## 2024-07-25 NOTE — Progress Notes (Signed)
 PHARMACY - TOTAL PARENTERAL NUTRITION CONSULT NOTE  Indication: Ileus, Intolerance to EN  Patient Measurements: Height: 5' 1 (154.9 cm) Weight: 74.1 kg (163 lb 5.8 oz) IBW/kg (Calculated) : 47.8 TPN AdjBW (KG): 53.6 Body mass index is 30.87 kg/m.  Assessment:  28 YOF presented on 10/21 with PNA/COPD exacerbation, hospitalization complicated by ARF requiring CRRT, PTX, cervical stenosis/paralysis and Afib RVR.  Patient initially tolerated TF and was having diarrhea requiring fiber.  Ileus developed around 11/12 and TF/fiber were held, started on Reglan , Relistor and neostigmine.  Trickle TF resumed on 11/15 and then held 11/17 given significant NG output and aspiration.  Pharmacy consulted to dose TPN given prolonged ileus.  Glucose / Insulin: hx preDM, A1c 5.6% - CBGs < 180 Used 13 units sSSI in the past 24 hrs Electrolytes: preHD labs - low Na/CL, K 3.9 post 3 runs (goal >/= 4), low iCa and 2gm given 11/19, Mag 1.9 post 1gm (goal >/= 2), others WNL Renal: CRRT 11/11 >> 11/16, HD 11/18 - BUN up to 93 Hepatic: LFTs elevated (not d/t TPN), lipase 99, tbili WNL, albumin < 1.5 Intake / Output; MIVF: NG 0mL, stool 0mL GI meds: PRN erythromycin and Relistor GI Imaging: none since TPN initiation GI Surgeries / Procedures: none since TPN initiation  Central access: PICC placed 07/12/24 TPN start date: 07/22/24  Nutritional Goals: Goal concentrated TPN rate is 60 mL/hr to provide 1738 kCal and 115g AA per day  RD Estimated Needs Total Energy Estimated Needs: 1700-1900 Total Protein Estimated Needs: 110-125 grams Total Fluid Estimated Needs: >/=1.5L  Current Nutrition:  TPN  Plan:  Continue concentrated TPN at goal rate 60 mL/hr at 1800 to provide 100% of needs Electrolytes in TPN: increase Na to 150 mEq/L, increase K to 11mEq/L (= 15 mEq/day), Ca 4mEq/L, increase Mg to 2.5mEq/L, Phos 21mmol/L, max CL Add standard MVI and trace elements to TPN (D/C BComplex) Continue resistant SSI Q4H   Standard TPN labs Mon/Thurs.  Renal function panel BID and daily Mag per Renal  Katie Waller, PharmD, BCPS, BCCCP 07/25/2024, 11:04 AM

## 2024-07-25 NOTE — Progress Notes (Signed)
 [facility]  Subjective  - getting dialysis   Objective (!) 121/58 88 97.9 F (36.6 C) (!) 25 100%  Intake/Output Summary (Last 24 hours) at 07/25/2024 1536 Last data filed at 07/25/2024 1400 Gross per 24 hour  Intake 2268.06 ml  Output 300 ml  Net 1968.06 ml   No palpable femoral pulses Bilateral feet remain mottled and cool  Laboratory Lab Results: Recent Labs    07/24/24 0523 07/24/24 1309 07/25/24 0421  WBC 16.1*  --  19.6*  HGB 6.0* 8.3* 8.8*  HCT 18.0* 24.6* 26.3*  PLT 137*  --  165   BMET Recent Labs    07/24/24 0523 07/25/24 0421  NA 133* 130*  K 3.1* 3.9  CL 91* 92*  CO2 26 22  GLUCOSE 221* 149*  BUN 53* 93*  CREATININE 1.97* 2.84*  CALCIUM  7.3* 8.3*    COAG Lab Results  Component Value Date   INR 0.9 06/25/2024   INR 0.97 09/17/2013   No results found for: PTT  Assessment/Planning:  79 y.o. 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  Remains off pressors today. 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  This looks quite chronic to me and she has reconstituted flow at the common femoral arteries that are quite diseased also.  Ultimately she would best be served with aortobifemoral bypass versus ax bifemoral which she is not a candidate in her deconditioned state with multisystem organ failure.  Could offer palliative bilateral above-knee amputations if her tissue loss progresses as we discussed but some chance that these would not heal with significant inflow disease.  Vascular will follow.  Lonni JINNY Gaskins 07/25/2024 3:36 PM --

## 2024-07-25 NOTE — Progress Notes (Signed)
 Pt transported from 2M09 to CT and back by RN and RT w/o complications

## 2024-07-25 NOTE — Progress Notes (Signed)
 Wasted 95ml of Ketamine in medroom with Brigida Hammersmith, RN

## 2024-07-25 NOTE — Progress Notes (Signed)
 NAMEKASSY MCENROE, MRN:  [id number], DOB:  [birth year], LOS: 30 ADMISSION DATE:  06/25/2024, CONSULTATION DATE:  06/25/2024 REFERRING MD:  Theodoro SAUNDERS, CHIEF COMPLAINT:  AMS, Sepsis, AR   History of Present Illness:  Jeraldean Wechter is a 78 yo female with past medical history significant for HTN, COPD, acute on chronic hypoxic, hypercapnic respiratory failure on 5-6L [place] baseline, diastolic heart failure, SCC Left lower lobe s/p radiation in September 2025, who presented via EMS after being found unresponsive at home. Code stroke called. On arrival to ED patient emergently intubated for acute respiratory distress and airway protection. CT Head with no ICH or large infarct, CTA head and neck with no LVO. CXR concerning for a Pneumonia with visible BL lung apices on CTA demonstrating infection/inflammation. Code stroke cancelled with presentation suspicious for sepsis. Patient received 30ml/kg crystalloid, pan cultured, and started empirically on Cefepime/Vanc/Azithromycin . Of note, family reports patient has recently not felt well and likely has had a cold. PCCM consulted for ICU admission.   On exam in ED, patient febrile to >101 and hypotensive, likely multifactorial in setting of presumed sepsis and recent sedation on induction with underlying heart disease. Norepinephrine gtt was initiated for MAPs < 65 and patient was transferred to ICU with plans to gain central access for vasopressor administration and arterial line placement for hemodynamic monitoring.  Pertinent  Medical History   Past Medical History:  Diagnosis Date   Abnormal finding on EKG 09/19/2013   Acute on chronic respiratory failure with hypercapnia (HCC)    Acute respiratory failure (HCC) 06/08/2016   Anxiety 11/09/2020   Arthritis    Asthma    Carotid bruit 11/09/2020   Chronic diastolic CHF (congestive heart failure) (HCC) 07/28/2017   Chronic obstructive pulmonary disease, unspecified (HCC) 11/09/2020   COPD, group D,  by GOLD 2017 classification (HCC) 09/17/2013   Cough 11/09/2020   Decreased estrogen level 11/09/2020   Diabetes (HCC)    Dyspnea    Edema 11/09/2020   Essential hypertension 09/18/2013   Goals of care, counseling/discussion    Hardening of the aorta (main artery of the heart) 11/09/2020   Heart disease    History of radiation therapy    Left Lung- 06/20/23-06/26/23- Dr. 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   Transient ischemic attack 11/09/2020   Venous stasis of both lower extremities 06/13/2016     Significant Hospital Events: Including procedures, antibiotic start and stop dates in addition to other pertinent events   10/21 Found unresponsive at home->ED>Code stroke>intubated for ARD/airway protection>CTH/CTA negative>Code stroke cancelled>Hypotensive>started on pressors>Admit to ICU  Arterial line placed 10/21 10/22 and 10/23 unsuccessful extubation 10/24: Despite maximal Precedex and fentanyl  pushes, unable to safely and comfortably clean the patient up so added prn Versed and started fentanyl  infusion. Rectal tube added for diarrhea.  10/24: 2/3 BC resulted in staph capitis/epidermitis, extubated on BiPAP 10/25: patient remained on BiPAP for 24 hours, when she came off of BiPAP, could not tolerate became tachypneic, tachycardic went into respiratory distress requiring endotracheal reintubation 10/29: unsuccessful SBT, went  briefly back into RVR 10/30: unsuccessful weaning from sedation as BP/HR increased considerably 11/2:  extubated to HFNC 11/8: progressively became more hypoxic with decreased respiratory drive prompting ABG this a.m. which revealed toxic hypercapnic respiratory failure.  Transferred back to ICU for impending intubation; tried draining pleural spaces but still tired out 11/10: trach 11/10: aggressive diuresis with Lasix  80 followed by 160 followed by 260 + metolazone  and bicarb - no response>Nephrology consulted 11/11: nephro consult. LE weakness noted on exam with inability to move - neuro consulted, MRI C and T spine ordered. CT pulled yesterday, worsening ptx>2 R CT placed, ptx improved. CRRT started. Hypotensive>briefly on Levo 11/12: Levo stopped. MRI C spine>cervical stenosis.  11/13 Off TF.  11/14: Worsening hypotension in setting of tension pneumothorax. Resolved after chest tube was flushed with improved pressor requirement. Levophed  now 5 mcg/min 11/15 back on TF 11/16 started zosyn  for leukocytosis 11/17 TF held. Started on Ketamine . Received 1 pRBC 11/18 Art line came out  11/19 ABI ordered   Interim History / Subjective:  O/N events: None  Intubated, on vent. On Fent, Ketamine .    Objective    Blood pressure (!) 149/55, pulse 91, temperature 99.7 F (37.6 C), temperature source Axillary, resp. rate (!) 21, height 5' 1 (1.549 m), weight 74.1 kg, SpO2 100%. CVP:  [10 mmHg] 10 mmHg  Vent Mode: PRVC FiO2 (%):  [40 %] 40 % Set Rate:  [25 bmp] 25 bmp Vt Set:  [380 mL] 380 mL PEEP:  [5 cmH20] 5 cmH20 Pressure Support:  [12 cmH20] 12 cmH20 Plateau Pressure:  [15 cmH20-17 cmH20] 16 cmH20   Intake/Output Summary (Last 24 hours) at 07/25/2024 0643 Last data filed at 07/25/2024 9461 Gross per 24 hour  Intake 3089.16 ml  Output 225 ml  Net 2864.16 ml   Filed Weights   07/23/24 1135 07/24/24 0500 07/25/24 0443  Weight: 71.5 kg 75.1 kg 74.1 kg    Examination: General: ill appearing  HENT: NGT with bilious output  Neck: Trach in place  Lungs: Rhonchorus on MV   Cardiovascular: irregular  Abdomen: distended but soft Extremities: Cold bilateral feet with darkening skin changes, no dp and pT pulses palpated.  Neuro: Awakens, does not follow commands  GU: No foley   UOP 0   Chest tube: ~20 cc  NGT: ~425 cc  I/O +225  Pertinent Labs: WBC 19.6 (16.1), Hgb 8.8 (6.0), PLT 165  Mag 1.9, Na 130 (133), K 3.9 (3.1), Scr 2.84 (1.97), phos 3.4  AST 136, ALT 337, ALP 146  Alb < 1.5  BUN 93 (53)   Trach Aspirate 11/17 showed few budding yeast   Resolved problem list  Acute metabolic encephalopathy  Human metapneumovirus pneumonia CRRT-related hypotension Hypokalemia Hypomagnesemia Hypoglycemia    Assessment and Plan   Acute on chronic hypoxic & hypercapnic respiratory failure s/p trach 11/9 Fluid overload - baseline weight per daughter around 146-150 lbs BL effusions: s/p thoracentesis 11/8 with 600 cc transudative fluid Right PTX s/p chest tube: #1 R pigtail CT pulled 11/10; #2 R CT remained; immediate recurring ptx>R pigtail re-placed>both CT to - . 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  - Tracheal Aspirate showed few yeast, pending   Shock - Multifactorial in setting of CRRT, sedation, sepsis.  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 There is hyperpigmentation/darkening of the skin dorsal and ventral surfaces of the feet (images on the chart).  - US  ABI/TBI with undetectable doppler waveforms bilateral PTA/DPA.  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 -Appreciate NS's management; no plans for intervention of cervical stenosis given overall condition -PT, OT as patient tolerates    Afib with RVR, new onset, paroxysmal Chronic HFpEF  HTN -IV amiodarone & bival, Previously had difficulty with heparin dosing due to very labile heparin levels  -tele monitoring -monitor electrolytes and replete as needed    RIJ CVL associated blood clot - Continue bivalirudin   AOC anemia  S/p 1 pRBC 11/17, s/p 1 pRBC 11/19  - Monitor Hgb daily  - Transfuse if Hgb < 7 - Consider ESA per nephrology  - no IV iron with concern for active infection   Anuric AKI Likely 2/2 sepsis/pneumonia and the use of antibiotics  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], 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-] - Plan for HD today  - Midodrine 10 mg TID, can increase it to Midodrine 15 TID if needed for BP support  - Bicarb gtt stopped - avoid nephrotoxins - strict I/O - renally dose meds, avoid nephrotoxic meds   Ileus  Last recorded LBM 11/14  has been an ongoing issue; has not had adequate enteral nutrition in about 3 weeks  NGT to wall suction Methylnaltrexone and erythromycin down cortrak 11/17 and 11/18 without any effect  Stop reglan , neostigmine 11/17 d/t no to effect Unable to wean down on Fent    GOC - Multiorgan failure with multiple life sustaining interventions  - Guarded Prognosis      Labs   CBC: Recent Labs  Lab 07/21/24 0427 07/21/24 1513 07/22/24 0308 07/23/24 0534 07/24/24 0456 07/24/24 0523 07/24/24 1309 07/25/24 0421  WBC 17.3*  --  20.4* 18.7*  --  16.1*  --  19.6*  HGB 7.7*   < > 6.6* 7.1* 6.8* 6.0* 8.3* 8.8*  HCT 24.3*   < > 20.3* 20.6* 20.0* 18.0* 24.6* 26.3*  MCV 95.3  --  95.3 89.6  --  93.3  --  89.8  PLT 205  --  PLATELET CLUMPS NOTED ON SMEAR, UNABLE TO ESTIMATE 151  --  137*  --  165   < > = values in this interval not displayed.    Basic Metabolic Panel: Recent Labs  Lab 07/21/24 0427 07/21/24 1513 07/22/24 0308 07/22/24 2012 07/23/24 0534 07/24/24 0456 07/24/24 0523 07/25/24 0421  NA 136   < > 135 131* 133* 129* 133* 130*  K 3.8   < >  4.8 5.1 5.1 3.3* 3.1* 3.9  CL 95*   < > 90* 84* 86*  --  91* 92*  CO2 29   < > 33* 30 28  --  26 22  GLUCOSE 167*   < > 142* 213* 125*  --  221* 149*  BUN 15   < > 42* 64* 73*  --  53* 93*  CREATININE 0.72   < > 1.36* 2.10* 2.47*  --  1.97* 2.84*  CALCIUM  8.0*   < > 7.4* 7.0* 7.8*  --  7.3* 8.3*  MG 2.3  --  2.1  --  2.1  --  1.6* 1.9  PHOS 3.5   < > 4.6 6.4* 6.5*  --  3.2 3.4   < > = values in this interval not displayed.   GFR: Estimated Creatinine Clearance: 15.8 mL/min (A) (by C-G formula based on SCr of 2.84 mg/dL (H)). Recent Labs  Lab 07/22/24 0308 07/23/24 0534 07/24/24 0523 07/25/24 0421  WBC 20.4* 18.7* 16.1* 19.6*    Liver Function Tests: Recent Labs  Lab 07/22/24 0308 07/22/24 2012 07/23/24 0534 07/24/24 0523 07/25/24 0421  AST  --   --   --   --  136*  ALT  --   --   --   --  337*  ALKPHOS  --   --   --   --  146*  BILITOT  --   --   --   --  0.7  PROT  --   --   --   --  5.3*  ALBUMIN  1.7* <1.5* <1.5* <1.5* <1.5*   No results for input(s): LIPASE, AMYLASE in the last 168 hours. No results for input(s): AMMONIA in the last 168 hours.  ABG    Component Value Date/Time   PHART 7.370 07/24/2024 0456   PCO2ART 53.3 (H) 07/24/2024 0456   PO2ART 107  07/24/2024 0456   HCO3 30.9 (H) 07/24/2024 0456   TCO2 32 07/24/2024 0456   ACIDBASEDEF 2.0 07/19/2024 2016   O2SAT 98 07/24/2024 0456     Coagulation Profile: No results for input(s): INR, PROTIME in the last 168 hours.  Cardiac Enzymes: No results for input(s): CKTOTAL, CKMB, CKMBINDEX, TROPONINI in the last 168 hours.  HbA1C: Hgb A1c MFr Bld  Date/Time Value Ref Range Status  06/26/2024 04:47 AM 5.6 4.8 - 5.6 % Final    Comment:    (NOTE) Diagnosis of Diabetes The following HbA1c ranges recommended by the American Diabetes Association (ADA) may be used as an aid in the diagnosis of diabetes mellitus.  Hemoglobin             Suggested A1C NGSP%              Diagnosis  <5.7                   Non Diabetic  5.7-6.4                Pre-Diabetic  >6.4                   Diabetic  <7.0                   Glycemic control for                       adults with diabetes.    09/17/2013 01:57 PM 6.1 (H) <5.7 % Final    Comment:    (  NOTE)                                                                       According to the ADA Clinical Practice Recommendations for 2011, when HbA1c is used as a screening test:  >=6.5%   Diagnostic of Diabetes Mellitus           (if abnormal result is confirmed) 5.7-6.4%   Increased risk of developing Diabetes Mellitus References:Diagnosis and Classification of Diabetes Mellitus,Diabetes Care,2011,34(Suppl 1):S62-S69 and Standards of Medical Care in         Diabetes - 2011,Diabetes Care,2011,34 (Suppl 1):S11-S61.    CBG: Recent Labs  Lab 07/24/24 1115 07/24/24 1524 07/24/24 1937 07/24/24 2322 07/25/24 0319  GLUCAP 175* 117* 138* 122* 120*    Review of Systems:   As above   Past Medical History:  She,  has a past medical history of Abnormal finding on EKG (09/19/2013), Acute on chronic respiratory failure with hypercapnia (HCC), Acute respiratory failure (HCC) (06/08/2016), Anxiety (11/09/2020), Arthritis, Asthma,  Carotid bruit (11/09/2020), Chronic diastolic CHF (congestive heart failure) (HCC) (07/28/2017), Chronic obstructive pulmonary disease, unspecified (HCC) (11/09/2020), COPD, group D, by GOLD 2017 classification (HCC) (09/17/2013), Cough (11/09/2020), Decreased estrogen level (11/09/2020), Diabetes (HCC), Dyspnea, Edema (11/09/2020), Essential hypertension (09/18/2013), Goals of care, counseling/discussion, Hardening of the aorta (main artery of the heart) (11/09/2020), Heart disease, History of radiation therapy, Hypertension, Hypertensive heart failure (HCC) (11/09/2020), Hypoxia (11/09/2020), Insomnia (11/09/2020), Iron deficiency anemia (11/09/2020), Large liver (11/09/2020), Near syncope (09/17/2013), Osteoporosis (11/09/2020), Palliative care encounter, Prediabetes (09/19/2013), Pure hypercholesterolemia (11/09/2020), Raynaud's disease (11/09/2020), Sinus tachycardia (09/18/2013), Skin sensation disturbance (11/09/2020), Smoker (09/17/2013), 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   Surgical History:   Past Surgical History:  Procedure Laterality Date   BRONCHIAL BIOPSY  05/25/2023   Procedure: BRONCHIAL BIOPSIES;  Surgeon: Brenna Adine CROME, DO;  Location: [facility];  Service: Pulmonary;;   BRONCHIAL NEEDLE ASPIRATION BIOPSY  05/25/2023   Procedure: BRONCHIAL NEEDLE ASPIRATION BIOPSIES;  Surgeon: Brenna Adine CROME, DO;  Location: [facility];  Service: Pulmonary;;   FIDUCIAL MARKER PLACEMENT  05/25/2023   Procedure: FIDUCIAL MARKER PLACEMENT;  Surgeon: Brenna Adine CROME, DO;  Location: [facility];  Service: Pulmonary;;   KNEE SURGERY Left      Social History:   reports that she quit smoking about 7 years ago. Her smoking use included cigarettes. She started smoking about 7 years ago. She has a 55 pack-year smoking history. She has never used smokeless tobacco. She reports that she does not  drink alcohol and does not use drugs.   Family History:  Her family history includes Hyperlipidemia in her father and mother; Hypertension in her father and mother.   Allergies Allergies  Allergen Reactions   Ace Inhibitors Swelling   Diphenhydramine Other (See Comments)    Hyperactive/jittery    Doxycycline Hyclate Nausea And Vomiting   Codeine Nausea And Vomiting     Home Medications  Prior to Admission medications   Medication Sig Start Date End Date Taking? Authorizing Provider  acetaminophen  (TYLENOL ) 500 MG tablet Take 1,000 mg by mouth every 6 (six) hours as needed for moderate pain.   Yes [provider]  albuterol  (  PROVENTIL  HFA;VENTOLIN  HFA) 108 (90 BASE) MCG/ACT inhaler Inhale 2 puffs into the lungs every 6 (six) hours as needed for wheezing or shortness of breath. 09/19/13  Yes Johnson, Clanford L, MD  aspirin  81 MG chewable tablet Chew 1 tablet (81 mg total) by mouth daily. 09/19/13  Yes Johnson, Clanford L, MD  atorvastatin  (LIPITOR) 10 MG tablet TAKE 1 TABLET BY MOUTH EVERY DAY 07/21/23  Yes Krasowski, Robert J, MD  benzonatate  (TESSALON ) 100 MG capsule Take 100 mg by mouth 3 (three) times daily as needed for cough.    Yes [provider]  Biotin 10 MG CAPS Take 10 mg by mouth daily.   Yes [provider]  budesonide -formoterol  (SYMBICORT ) 160-4.5 MCG/ACT inhaler Inhale 2 puffs into the lungs 2 (two) times daily. 08/23/19  Yes Mannam, Praveen, MD  calcium  carbonate (OSCAL) 1500 (600 Ca) MG TABS tablet Take 600 mg of elemental calcium  by mouth daily.   Yes [provider]  Cholecalciferol (VITAMIN D ) 50 MCG (2000 UT) tablet Take 2,000 Units by mouth daily.   Yes [provider]  diphenhydramine-acetaminophen  (TYLENOL  PM) 25-500 MG TABS tablet Take 1 tablet by mouth at bedtime.   Yes [provider]  furosemide  (LASIX ) 40 MG tablet Take 40 mg by mouth daily.   Yes [provider]  gabapentin (NEURONTIN) 300 MG  capsule Take 900 mg by mouth at bedtime.   Yes [provider]  iron polysaccharides (NIFEREX) 150 MG capsule Take 150 mg by mouth daily. 11/05/20  Yes [provider]  loratadine (CLARITIN) 10 MG tablet Take 10 mg by mouth daily as needed for allergies.   Yes [provider]  LORazepam  (ATIVAN ) 1 MG tablet Take 1 tablet (1 mg total) by mouth every 6 (six) hours as needed for anxiety. 08/07/17  Yes Drusilla Sabas RAMAN, MD  losartan  (COZAAR ) 50 MG tablet Take 50 mg by mouth daily.   Yes [provider]  Multiple Vitamin (MULTIVITAMIN WITH MINERALS) TABS tablet Take 1 tablet by mouth daily.   Yes [provider]  raloxifene  (EVISTA ) 60 MG tablet Take 60 mg by mouth every morning. 08/07/20  Yes [provider]  Tiotropium Bromide  Monohydrate (SPIRIVA  RESPIMAT) 2.5 MCG/ACT AERS Inhale 1 puff into the lungs daily.   Yes [provider]  traZODone  (DESYREL ) 100 MG tablet Take 1 tablet (100 mg total) by mouth at bedtime as needed for sleep. 08/07/17  Yes Drusilla Sabas RAMAN, MD     Critical care time:

## 2024-07-25 NOTE — Plan of Care (Signed)
?  Problem: Clinical Measurements: ?Goal: Respiratory complications will improve ?Outcome: Not Progressing ?  ?Problem: Activity: ?Goal: Risk for activity intolerance will decrease ?Outcome: Not Progressing ?  ?Problem: Nutrition: ?Goal: Adequate nutrition will be maintained ?Outcome: Not Progressing ?  ?

## 2024-07-25 NOTE — Progress Notes (Signed)
   07/25/24 1852  Vitals  Temp 97.8 F (36.6 C)  Pulse Rate 85  Resp (!) 24  BP 138/66  SpO2 100 %  O2 Device Tracheostomy Collar;Ventilator  Oxygen  Therapy  Patient Activity (if Appropriate) In bed  Pulse Oximetry Type Continuous  Oximetry Probe Site Changed No  Post Treatment  Dialyzer Clearance Clear  Liters Processed 63  Fluid Removed (mL) 3000 mL  Tolerated HD Treatment Yes  Post-Hemodialysis Comments Pt. tolerated tx well

## 2024-07-25 NOTE — Progress Notes (Signed)
 Nutrition Follow-up  DOCUMENTATION CODES:  Not applicable  INTERVENTION:  TPN via PICC: Nutrition recommendations are below Management per pharmacy team When able to restart enteral feeds, recommend the following goal regimen: [employer] 1.4 RTH at 50 ml/h (1200 ml per day) Prosource TF20 60 ml BID Provides 1840 kcal, 114 gm protein, 864 ml free water daily Of note, cortrak tube is not post-pyloric at this time Monitor goals of care discussions  NUTRITION DIAGNOSIS:  Inadequate oral intake related to acute illness as evidenced by NPO status. Ongoing.   GOAL:  Patient will meet greater than or equal to 90% of their needs Progressing, TPN to be initiated  MONITOR:  Vent status, Labs, Weight trends, TF tolerance  REASON FOR ASSESSMENT:  Consult  (CRRT)  ASSESSMENT:  Pt admitted with AMS, sepsis, ARF. 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  10/21 - admitted d/t PNA, HMPV infection; intubated; stroke/sz ruled out 10/23 - failed SBT, TF initiated 10/24 - extubated 10/25 - re-intubated 11/2 - extubated, no access for TF 11/03 - advanced to clear liquid diet 11/04 - advanced to full liquid diet 11/05 - transfer out of ICU; photo of sacral wound; doc as DTI 11/07 - Cortrak placed, TF started 11/08 - hypoxic; transfer to ICU (4N) 11/09 - trach placed; echo: dilated IVC and RA pressures 11/12 - FMS inserted, only small amount of stool in tubing; noted sacral wound documented as stage II 11/13 - transferred to 42M, TF off for vomiting 11/15 - TF restarted at trickle 11/16 - CRRT held 11/17 - NGT to suction, TPN initiated at 1/2 rate  Pt resting in bed at the time of assessment. Pt discussed during ICU rounds and with RN and MD. Pt had CTA this AM which showed significant occlusions to the lower extremities. Vascular now involved and relayed to family they can only offer  bilateral AKAs but they are unsure of the healing potentiation. Ongoing goals of care discussions to be held. At this time, pt remains on full rate TPN. Scheduled for HD today.   Per Pharmacy: Goal concentrated TPN rate is 60 mL/hr to provide 1738 kCal and 115g AA per day   MV: 10 L/min Temp (24hrs), Avg:98.8 F (37.1 C), Min:97.9 F (36.6 C), Max:99.7 F (37.6 C) MAP (art line): 68-91 mmHg this AM  Admit weight: 71 kg  Current weight: 74.1 kg Pitting edema noted to all extremities   Intake/Output Summary (Last 24 hours) at 07/25/2024 1555 Last data filed at 07/25/2024 1400 Gross per 24 hour  Intake 2268.06 ml  Output 300 ml  Net 1968.06 ml  Net IO Since Admission: 4,144.21 mL [07/25/24 1555]  Drains/Lines: PICC double lumen Temporary HD Catheter, left IJ Chest tube, right, 34mL x 24 hours NGT, right nare, out x 24 hours Trach #6 shiley cuffed Cortrak, left nare   Average Meal Intake: 10/22-11/4: 17% intake x 3 recorded meals  Nutritionally Relevant Medications: Scheduled Meds:  insulin aspart  0-20 Units Subcutaneous Q4H   pantoprazole IV  40 mg Intravenous Q24H   polyethylene glycol  17 g Per Tube Daily   Continuous Infusions:  amiodarone 30 mg/hr (07/25/24 0800)   bivalirudin (ANGIOMAX) 250 mg in sodium chloride  0.9 % 500 mL (0.5 mg/mL) infusion 0.028 mg/kg/hr (07/25/24 0800)   ketamine (KETALAR) adult infusion 0.5 mg/kg/hr (07/25/24 0800)   piperacillin-tazobactam (ZOSYN)  IV Stopped (07/25/24 0411)   TPN ADULT (ION) 60 mL/hr  at 07/25/24 0800   PRN Meds: ondansetron , polyethylene glycol, simethicone  Labs Reviewed: Sodium 130, Chloride 92 BUN 93, creatinine 2.84 CBG ranges from 117-184 mg/dL over the last 24 hours HgbA1c 5.6%  NUTRITION - FOCUSED PHYSICAL EXAM: Flowsheet Row Most Recent Value  Orbital Region No depletion  Upper Arm Region No depletion  [L arm edema]  Thoracic and Lumbar Region No depletion  Buccal Region No depletion  Temple  Region No depletion  Clavicle Bone Region Mild depletion  Clavicle and Acromion Bone Region Moderate depletion  Scapular Bone Region Moderate depletion  Dorsal Hand Moderate depletion  Patellar Region Unable to assess  [edema]  Anterior Thigh Region Unable to assess  [edema]  Posterior Calf Region Unable to assess  [edema]  Edema (RD Assessment) Moderate  Hair Reviewed  Eyes Reviewed  Mouth Unable to assess  Skin Reviewed  Nails Reviewed    Diet Order:   Diet Order             Diet NPO time specified  Diet effective now                   EDUCATION NEEDS:  Not appropriate for education at this time  Skin: Skin Integrity Issues: Skin Integrity Issues: DTI, Stage II DTI: L heel, sacrum Stage III: sacrum Per WOC 11/17: Pt was previously noted to have a Deep tissue pressure injury to bilat buttocks and sacrum. This has now evolved to 50% Stage 3 pressure injury, 50% unstageable slough/eschar.  Deep tissue pressure injury and loose peeling skin remains to wound edges. Affected area is approx 12X12X.3cm, mod amt pink drainage.  11/20 WOC Note: R/L heel/feet: deep purple maroon discoloration ascending from toes to mid ankle (felt to be related to ischemia, no pulse, vascular now following, likely needs AKAs) L posterior leg: light pink dry  Last BM:  11/14 per flowsheet  Height:  Ht Readings from Last 1 Encounters:  07/24/24 5' 1 (1.549 m)    Weight:  Wt Readings from Last 1 Encounters:  07/25/24 74.1 kg    Ideal Body Weight:  47.7 kg  BMI:  Body mass index is 30.87 kg/m.  Estimated Nutritional Needs:  Kcal:  1700-1900 Protein:  110-125 grams Fluid:  >/=1.5L   Vernell Lukes, RD, LDN, CNSC Registered Dietitian II Please reach out via secure chat

## 2024-07-25 NOTE — Progress Notes (Signed)
 Pt. On bed, responsive to voice, on ventilator. Consent signed and on file. HD started with no complications.  UF removal:3016ml Tx duration:3.5 hours  Access used: left CVC Access issue: None  Pt. Completed and tolerated tx. Catheter locked and dwelled with heparin. Endorsed to floor nurse.   Tanza Pellot Rubi B. Jaykwon Morones, RN [facility]

## 2024-07-25 NOTE — Progress Notes (Signed)
 PHARMACY - ANTICOAGULATION CONSULT NOTE  Pharmacy Consult:  Bivalirudin  Indication: atrial fibrillation and internal jugular thombus   Allergies  Allergen Reactions   Ace Inhibitors Swelling   Diphenhydramine Other (See Comments)    Hyperactive/jittery    Doxycycline Hyclate Nausea And Vomiting   Codeine Nausea And Vomiting    Patient Measurements: Height: 5' 1 (154.9 cm) Weight: 74.1 kg (163 lb 5.8 oz) IBW/kg (Calculated) : 47.8 HEPARIN DW (KG): 63.1  Vital Signs: Temp: 99.7 F (37.6 C) (11/20 0319) Temp Source: Axillary (11/20 0319) BP: 123/54 (11/20 0700) Pulse Rate: 88 (11/20 0700)  Labs: Recent Labs    07/23/24 0534 07/23/24 1659 07/24/24 0523 07/24/24 1309 07/24/24 1548 07/24/24 1651 07/25/24 0421  HGB 7.1*   < > 6.0* 8.3*  --   --  8.8*  HCT 20.6*   < > 18.0* 24.6*  --   --  26.3*  PLT 151  --  137*  --   --   --  165  APTT 83*   < > 81*  --  98* 87* 84*  CREATININE 2.47*  --  1.97*  --   --   --  2.84*   < > = values in this interval not displayed.    Estimated Creatinine Clearance: 15.8 mL/min (A) (by C-G formula based on SCr of 2.84 mg/dL (H)).   Assessment: Katie Waller with recurrent Afib to resume bivalirudin.  She was previously on IV heparin and was switched to bivalirudin due to labile heparin levels.  Med was stopped because it was thought that Afib was secondary to critical illness, which resolved. However patient went back into Afib w/ RVR on 11/7 and bivalirudin has been resumed.  aPTT therapeutic at 84 sec and high normal.  No issue with infusion nor bleeding.  Hemoglobin back up after transfusion and platelet count normalized.  Goal of Therapy:  aPTT 50-85 seconds Monitor platelets by anticoagulation protocol: Yes   Plan:  Reduce bivalrudin slightly to 0.023 mg/kg/hr (Wt: 66 kg) aPTT BID Daily CBC  Maki Sweetser D. Lendell, PharmD, BCPS, BCCCP 07/25/2024, 10:23 AM

## 2024-07-25 NOTE — Consult Note (Addendum)
 WOC Nurse Consult Note: WOC consult performed remotely utilizing imaging and chart review  Reason for Consult:DTI to heel and lt back of calf  Wound type: R/L heel deep purple maroon discoloration with intact skin and L posterior leg deep purple maroon discoloration with sloughing skin to reveal light pink tissue, not consistent with only pressure, noted discoloration extending to R/L bottom and top of foot including toes suggestive of vascular/perfusion related injuries in setting of acute illness with multi organ system failure, high risk to evolve into necrotic tissue, noted vascular surgery has consulted on 11/19 (see note).  Of importance: patient with ABI's of 0 bilaterally, per vascular surgery unable to feel femoral pulse, mottling to bilateral lower extremities and noted to not be a candidate for emergent revascularization.  Pressure Injury POA: NA Measurement: see nursing flow sheets Wound bed:  R/L heel/feet: deep purple maroon discoloration ascending from toes to mid ankle L posterior leg: light pink dry Drainage (amount, consistency, odor) see nursing flow sheets Periwound: intact Dressing procedure/placement/frequency:  R/L heel/foot: leave open to air, off load bilateral heels in Prevalon boots  (lawson [phone]) L posterior leg: cleanse with NS, pat dry, apply Xeroform over wound bed and cover with silicone foam dressing.  Change daily.                WOC team continues to follow patient for previously noted sacral wound.   Thank you,  Doyal Polite, MSN, RN, [facility] WOC Team [phone] (Available Mon-Fri 0700-1500)

## 2024-07-25 NOTE — Progress Notes (Signed)
 PHARMACY - ANTICOAGULATION CONSULT NOTE  Pharmacy Consult:  Bivalirudin   Indication: atrial fibrillation and internal jugular thombus   Allergies  Allergen Reactions   Ace Inhibitors Swelling   Diphenhydramine Other (See Comments)    Hyperactive/jittery    Doxycycline Hyclate Nausea And Vomiting   Codeine Nausea And Vomiting    Patient Measurements: Height: 5' 1 (154.9 cm) Weight:  (Unable to obtain weight) IBW/kg (Calculated) : 47.8 HEPARIN  DW (KG): 63.1  Vital Signs: Temp: 97.8 F (36.6 C) (11/20 1852) Temp Source: Oral (11/20 1548) BP: 138/66 (11/20 1852) Pulse Rate: 85 (11/20 1852)  Labs: Recent Labs    07/23/24 0534 07/23/24 1659 07/24/24 0523 07/24/24 1309 07/24/24 1548 07/24/24 1651 07/25/24 0421 07/25/24 1848  HGB 7.1*   < > 6.0* 8.3*  --   --  8.8*  --   HCT 20.6*   < > 18.0* 24.6*  --   --  26.3*  --   PLT 151  --  137*  --   --   --  165  --   APTT 83*   < > 81*  --    < > 87* 84* 46*  CREATININE 2.47*  --  1.97*  --   --   --  2.84*  --    < > = values in this interval not displayed.    Estimated Creatinine Clearance: 15.8 mL/min (A) (by C-G formula based on SCr of 2.84 mg/dL (H)).   Assessment: 49 YOF with recurrent Afib to resume bivalirudin .  She was previously on IV heparin  and was switched to bivalirudin  due to labile heparin  levels.  Med was stopped because it was thought that Afib was secondary to critical illness, which resolved. However patient went back into Afib w/ RVR on 11/7 and bivalirudin  has been resumed.  aPTT subtherapeutic at 46 sec.  No issue with infusion nor bleeding reported per RN.    Goal of Therapy:  aPTT 50-85 seconds Monitor platelets by anticoagulation protocol: Yes   Plan:  Increase bivalrudin slightly to 0.026 mg/kg/hr (Wt: 66 kg) Check aPTT in 4hs. aPTT BID Daily CBC  Rocky Slade, PharmD, BCPS 07/25/2024, 7:34 PM

## 2024-07-26 LAB — GLUCOSE, CAPILLARY
Glucose-Capillary: 103 mg/dL — ABNORMAL HIGH (ref 70–99)
Glucose-Capillary: 152 mg/dL — ABNORMAL HIGH (ref 70–99)
Glucose-Capillary: 128 mg/dL — ABNORMAL HIGH (ref 70–99)
Glucose-Capillary: 163 mg/dL — ABNORMAL HIGH (ref 70–99)
Glucose-Capillary: 142 mg/dL — ABNORMAL HIGH (ref 70–99)
Glucose-Capillary: 140 mg/dL — ABNORMAL HIGH (ref 70–99)

## 2024-07-26 LAB — RENAL FUNCTION PANEL
Albumin: 1.6 g/dL — ABNORMAL LOW (ref 3.5–5.0)
Anion gap: 25 — ABNORMAL HIGH (ref 5–15)
BUN: 56 mg/dL — ABNORMAL HIGH (ref 8–23)
CO2: 21 mmol/L — ABNORMAL LOW (ref 22–32)
Calcium: 7.2 mg/dL — ABNORMAL LOW (ref 8.9–10.3)
Chloride: 86 mmol/L — ABNORMAL LOW (ref 98–111)
Creatinine, Ser: 2.03 mg/dL — ABNORMAL HIGH (ref 0.44–1.00)
GFR, Estimated: 25 mL/min — ABNORMAL LOW (ref >60)
Glucose, Bld: 266 mg/dL — ABNORMAL HIGH (ref 70–99)
Phosphorus: 2 mg/dL — ABNORMAL LOW (ref 2.5–4.6)
Potassium: 3.1 mmol/L — ABNORMAL LOW (ref 3.5–5.1)
Sodium: 132 mmol/L — ABNORMAL LOW (ref 135–145)

## 2024-07-26 LAB — CBC
HCT: 21.8 % — ABNORMAL LOW (ref 36.0–46.0)
Hemoglobin: 7.3 g/dL — ABNORMAL LOW (ref 12.0–15.0)
MCH: 30 pg (ref 26.0–34.0)
MCHC: 33.5 g/dL (ref 30.0–36.0)
MCV: 89.7 fL (ref 80.0–100.0)
Platelets: 175 K/uL (ref 150–400)
RBC: 2.43 MIL/uL — ABNORMAL LOW (ref 3.87–5.11)
RDW: 17.2 % — ABNORMAL HIGH (ref 11.5–15.5)
WBC: 17.5 K/uL — ABNORMAL HIGH (ref 4.0–10.5)
nRBC: 2.1 % — ABNORMAL HIGH (ref 0.0–0.2)

## 2024-07-26 LAB — BASIC METABOLIC PANEL WITH GFR
Anion gap: 16 — ABNORMAL HIGH (ref 5–15)
BUN: 66 mg/dL — ABNORMAL HIGH (ref 8–23)
CO2: 23 mmol/L (ref 22–32)
Calcium: 8.1 mg/dL — ABNORMAL LOW (ref 8.9–10.3)
Chloride: 94 mmol/L — ABNORMAL LOW (ref 98–111)
Creatinine, Ser: 1.98 mg/dL — ABNORMAL HIGH (ref 0.44–1.00)
GFR, Estimated: 26 mL/min — ABNORMAL LOW (ref >60)
Glucose, Bld: 158 mg/dL — ABNORMAL HIGH (ref 70–99)
Potassium: 3.3 mmol/L — ABNORMAL LOW (ref 3.5–5.1)
Sodium: 133 mmol/L — ABNORMAL LOW (ref 135–145)

## 2024-07-26 LAB — MAGNESIUM
Magnesium: 1.7 mg/dL (ref 1.7–2.4)
Magnesium: 1.7 mg/dL (ref 1.7–2.4)

## 2024-07-26 LAB — APTT
aPTT: 87 s — ABNORMAL HIGH (ref 24–36)
aPTT: 101 s — ABNORMAL HIGH (ref 24–36)
aPTT: 72 s — ABNORMAL HIGH (ref 24–36)
aPTT: 82 s — ABNORMAL HIGH (ref 24–36)

## 2024-07-26 LAB — PHOSPHORUS: Phosphorus: 1.9 mg/dL — ABNORMAL LOW (ref 2.5–4.6)

## 2024-07-26 MED ORDER — INSULIN ASPART 100 UNIT/ML IJ SOLN
0.0000 [IU] | INTRAMUSCULAR | Status: DC
  Administered 2024-07-26: 2 [IU] via SUBCUTANEOUS
  Administered 2024-07-26: 3 [IU] via SUBCUTANEOUS
  Administered 2024-07-26 – 2024-07-27 (×4): 2 [IU] via SUBCUTANEOUS
  Administered 2024-07-27 (×2): 3 [IU] via SUBCUTANEOUS
  Administered 2024-07-27 (×2): 2 [IU] via SUBCUTANEOUS
  Administered 2024-07-28 (×4): 3 [IU] via SUBCUTANEOUS
  Administered 2024-07-29: 2 [IU] via SUBCUTANEOUS
  Administered 2024-07-29 (×5): 3 [IU] via SUBCUTANEOUS
  Administered 2024-07-29 – 2024-07-30 (×3): 2 [IU] via SUBCUTANEOUS
  Administered 2024-07-30 (×3): 3 [IU] via SUBCUTANEOUS
  Administered 2024-07-31 (×2): 2 [IU] via SUBCUTANEOUS
  Filled 2024-07-26: qty 2
  Filled 2024-07-26: qty 3
  Filled 2024-07-26 (×2): qty 2
  Filled 2024-07-26 (×2): qty 3
  Filled 2024-07-26: qty 2
  Filled 2024-07-26 (×10): qty 3
  Filled 2024-07-26 (×3): qty 2
  Filled 2024-07-26 (×2): qty 3
  Filled 2024-07-26 (×2): qty 2
  Filled 2024-07-26: qty 3
  Filled 2024-07-26: qty 2

## 2024-07-26 MED ORDER — ARFORMOTEROL TARTRATE 15 MCG/2ML IN NEBU: 15.0000 ug | INHALATION_SOLUTION | Freq: Two times a day (BID) | RESPIRATORY_TRACT | Status: DC

## 2024-07-26 MED ORDER — AMIODARONE HCL IN DEXTROSE 360-4.14 MG/200ML-% IV SOLN: 30.0000 mg/h | INTRAVENOUS | Status: DC

## 2024-07-26 MED ORDER — SODIUM CHLORIDE 0.9 % IV SOLN: 0.0260 mg/kg/h | INTRAVENOUS | Status: DC

## 2024-07-26 MED ORDER — FENTANYL BOLUS VIA INFUSION: 25.0000 ug | INTRAVENOUS | Status: DC | PRN

## 2024-07-26 MED ORDER — IPRATROPIUM-ALBUTEROL 0.5-2.5 (3) MG/3ML IN SOLN: 3.0000 mL | Freq: Four times a day (QID) | RESPIRATORY_TRACT | Status: DC | PRN

## 2024-07-26 MED ORDER — POTASSIUM PHOSPHATES 15 MMOLE/5ML IV SOLN
15.0000 mmol | Freq: Once | INTRAVENOUS | Status: AC
  Administered 2024-07-26: 15 mmol via INTRAVENOUS
  Filled 2024-07-26: qty 5

## 2024-07-26 MED ORDER — KETAMINE HCL 10 MG/ML IJ SOLN: 0.5000 mg/kg/h | Status: DC

## 2024-07-26 MED ORDER — MIDODRINE HCL 10 MG PO TABS: 10.0000 mg | ORAL_TABLET | Freq: Three times a day (TID) | ORAL | Status: DC

## 2024-07-26 MED ORDER — PANTOPRAZOLE SODIUM 40 MG IV SOLR: 40.0000 mg | INTRAVENOUS | Status: DC

## 2024-07-26 MED ORDER — PIPERACILLIN SOD-TAZOBACTAM SO 2.25 (2-0.25) G IV SOLR: 2.2500 g | Freq: Three times a day (TID) | INTRAVENOUS | Status: DC

## 2024-07-26 MED ORDER — FENTANYL CITRATE (PF) 50 MCG/ML IJ SOSY: 25.0000 ug | PREFILLED_SYRINGE | Freq: Once | INTRAMUSCULAR | Status: AC

## 2024-07-26 MED ORDER — HEPARIN SODIUM (PORCINE) 1000 UNIT/ML DIALYSIS: 1000.0000 [IU] | INTRAMUSCULAR | Status: DC | PRN

## 2024-07-26 MED ORDER — TRACE MINERALS CU-MN-SE-ZN 300-55-60-3000 MCG/ML IV SOLN
INTRAVENOUS | Status: AC
  Filled 2024-07-26: qty 768

## 2024-07-26 MED ORDER — MAGNESIUM SULFATE 2 GM/50ML IV SOLN
2.0000 g | Freq: Once | INTRAVENOUS | Status: AC
  Administered 2024-07-26: 2 g via INTRAVENOUS
  Filled 2024-07-26: qty 50

## 2024-07-26 MED ORDER — REVEFENACIN 175 MCG/3ML IN SOLN: 175.0000 ug | Freq: Every day | RESPIRATORY_TRACT | Status: DC

## 2024-07-26 NOTE — Progress Notes (Addendum)
 PHARMACY - ANTICOAGULATION CONSULT NOTE  Pharmacy Consult:  Bivalirudin   Indication: atrial fibrillation and internal jugular thombus   Allergies  Allergen Reactions   Ace Inhibitors Swelling   Diphenhydramine Other (See Comments)    Hyperactive/jittery    Doxycycline Hyclate Nausea And Vomiting   Codeine Nausea And Vomiting    Patient Measurements: Height: 5' 1 (154.9 cm) Weight: 75.5 kg (166 lb 7.2 oz) IBW/kg (Calculated) : 47.8 HEPARIN  DW (KG): 63.1  Vital Signs: Temp: 99.6 F (37.6 C) (11/21 0321) Temp Source: Axillary (11/21 0321) BP: 141/55 (11/21 0630) Pulse Rate: 90 (11/21 0630)  Labs: Recent Labs    07/24/24 0523 07/24/24 1309 07/24/24 1548 07/25/24 0421 07/25/24 1848 07/26/24 0038 07/26/24 0409  HGB 6.0* 8.3*  --  8.8*  --   --  7.3*  HCT 18.0* 24.6*  --  26.3*  --   --  21.8*  PLT 137*  --   --  165  --   --  175  APTT 81*  --    < > 84* 46* 87* 101*  CREATININE 1.97*  --   --  2.84*  --   --  2.03*   < > = values in this interval not displayed.    Estimated Creatinine Clearance: 22.3 mL/min (A) (by C-G formula based on SCr of 2.03 mg/dL (H)).   Assessment: 68 YOF with recurrent Afib to resume bivalirudin .  She was previously on IV heparin  and was switched to bivalirudin  due to labile heparin  levels.  Med was stopped because it was thought that Afib was secondary to critical illness, which resolved. However patient went back into Afib w/ RVR on 11/7 and bivalirudin  has been resumed.  aPTT supra-therapeutic at 101 sec.  RN reports labs obtain from central line where bivalirudin  is infusing.  RN will move infusion to pIV and recollect aPTT from the central line.  No issue with infusion nor bleeding.  Hemoglobin back up after transfusion and platelet count normalized.  Goal of Therapy:  aPTT 50-85 seconds Monitor platelets by anticoagulation protocol: Yes   Plan:  Continue bivalrudin at 0.026 mg/kg/hr (Wt: 66 kg) Start repeat aPTT aPTT BID Daily  CBC  Maxi Carreras D. Lendell, PharmD, BCPS, BCCCP 07/26/2024, 6:57 AM   ============================  Addendum: Repeat aPTT therapeutic at 72 sec Continue bivalirudin  at 0.026 mg/kg/hr  Anh Mangano D. Lendell, PharmD, BCPS, BCCCP 07/26/2024, 7:55 AM

## 2024-07-26 NOTE — Progress Notes (Signed)
 SLP Cancellation Note  Patient Details Name: Katie Waller MRN: [id number] DOB: 08/27/1946   Cancelled treatment: Patient not medically ready. Pt remains on vent. Following for PMSV readiness.             [name] 07/26/2024, 8:39 AM

## 2024-07-26 NOTE — Discharge Summary (Deleted)
 CRITICAL CARE DISCHARGE SUMMARY Patient ID: Katie Waller MRN: [id number] DOB/AGE: 1946/08/27 78 y.o.  Admit date: 06/25/2024 Discharge date: 07/26/2024  Problem List Principal Problem:   Sepsis [facility]) Active Problems:   Acute on chronic respiratory failure with hypoxia (HCC)   Hypotension   Septic shock (HCC)   Acute on chronic respiratory failure with hypoxia and hypercapnia (HCC)   On mechanically assisted ventilation (HCC)   Human metapneumovirus (hMPV) pneumonia   Viral pneumonia   COPD with acute exacerbation (HCC)   CKD (chronic kidney disease) stage 2, GFR 60-89 ml/min   Hyperglycemia   AKI (acute kidney injury)   Pressure injury of skin   Pneumothorax   Ileus (HCC)   Aspiration into airway   ESRD on dialysis (HCC)   Severe protein-calorie malnutrition   Lower limb ischemia   Tracheostomy status (HCC)  HPI: Karianne Shrieves is a 78 yo female with past medical history significant for HTN, COPD, acute on chronic hypoxic, hypercapnic respiratory failure on 5-6L [place] baseline, diastolic heart failure, SCC Left lower lobe s/p radiation in September 2025, who presented via EMS after being found unresponsive at home. Code stroke called. On arrival to ED patient emergently intubated for acute respiratory distress and airway protection. CT Head with no ICH or large infarct, CTA head and neck with no LVO. CXR concerning for a Pneumonia with visible BL lung apices on CTA demonstrating infection/inflammation. Code stroke cancelled with presentation suspicious for sepsis. Patient received 30ml/kg crystalloid, pan cultured, and started empirically on Cefepime /Vanc/Azithromycin . Of note, family reports patient has recently not felt well and likely has had a cold. PCCM consulted for ICU admission.   On exam in ED, patient febrile to >101 and hypotensive, likely multifactorial in setting of presumed sepsis and recent sedation on induction with underlying heart disease. Norepinephrine  gtt  was initiated for MAPs < 65 and patient was transferred to ICU with plans to gain central access for vasopressor administration and arterial line placement for hemodynamic monitoring.   Significant Hospital Course: 10/21 Found unresponsive at home->ED>Code stroke>intubated for ARD/airway protection>CTH/CTA negative>Code stroke cancelled>Hypotensive>started on pressors>Admit to ICU  Arterial line placed 10/21 10/22 and 10/23 unsuccessful extubation 10/24: Despite maximal Precedex  and fentanyl  pushes, unable to safely and comfortably clean the patient up so added prn Versed  and started fentanyl  infusion. Rectal tube added for diarrhea.  10/24: 2/3 BC resulted in staph capitis/epidermitis, extubated on BiPAP 10/25: patient remained on BiPAP for 24 hours, when she came off of BiPAP, could not tolerate became tachypneic, tachycardic went into respiratory distress requiring endotracheal reintubation 10/29: unsuccessful SBT, went briefly back into RVR 10/30: unsuccessful weaning from sedation as BP/HR increased considerably 11/2: extubated to HFNC 11/8: progressively became more hypoxic with decreased respiratory drive prompting ABG this a.m. which revealed toxic hypercapnic respiratory failure.  Transferred back to ICU for impending intubation; tried draining pleural spaces but still tired out 11/10: trach 11/10: aggressive diuresis with Lasix  80 followed by 160 followed by 260 + metolazone  and bicarb - no response>Nephrology consulted 11/11: nephro consult. LE weakness noted on exam with inability to move - neuro consulted, MRI C and T spine ordered. CT pulled yesterday, worsening ptx>2 R CT placed, ptx improved. CRRT started. Hypotensive>briefly on Levo 11/12: Levo stopped. MRI C spine>cervical stenosis.  11/13 Off TF.  11/14: Worsening hypotension in setting of tension pneumothorax. Resolved after chest tube was flushed with improved pressor requirement. Levophed  now 5 mcg/min 11/15 back on TF 11/16  started zosyn  for leukocytosis 11/17 TF held.  Started on Ketamine . Received 1 pRBC 11/18 Art line came out  11/19 ABI ordered > undetectable doppler waveforms bilateral PTA/DPA > 11/20 CTA > Not a candidate for aortobifemoral bypass versus ax bifemoral.  11/21 stopped Fent infusion, Prn Fent pushes.    Hospital conditions outlined below:   Acute on chronic hypoxic & hypercapnic respiratory failure s/p trach 11/9 Fluid overload - baseline weight per daughter around 146-150 lbs BL effusions: s/p thoracentesis 11/8 with 600 cc transudative fluid Right PTX s/p chest tube: #1 R pigtail CT pulled 11/10; #2 R CT remained; immediate recurring ptx>R pigtail re-placed>both CT to - . 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  - Tracheal Aspirate 11/17 showed few candida albicans.     Shock - Multifactorial in setting of CRRT, sedation, sepsis.  - Resolved - Off of levophed  11/18 and Vasopressin  11/19, able to maintain MAP > 65  - con't ketamine , midodrine   - Continue Zosyn  11/16 for 7 days  - last steroids 10/31; had been off steroids and pressors until NE restarted on 11/8   Acute on chronic bilateral LE Limb Ischemia  On exam, bilateral feet are cold to touch, no palpable dp or At pulses and doppler was used without any findings of pulses. There is hyperpigmentation/darkening of the skin in the dorsal and ventral surfaces of the feet (images on the chart).  - US  ABI/TBI with undetectable doppler waveforms bilateral  PTA/DPA.  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 -Appreciate NS's management; no plans for intervention of cervical stenosis given overall condition   Afib with RVR, new onset, paroxysmal Chronic HFpEF  HTN -IV amiodarone  & bival, Previously had difficulty with heparin  dosing due to very labile heparin  levels    RIJ CVL associated blood clot - Continue bivalirudin    AOC anemia  S/p 1 pRBC 11/17, s/p 1 pRBC 11/19  - Consider ESA per nephrology  - no IV iron with concern for active infection   Anuric AKI Likely 2/2 sepsis/pneumonia and the use of antibiotics  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], 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, 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] - Midodrine  10 mg TID, can increase it to Midodrine  15 TID if needed for BP support  - Bicarb gtt stopped - avoid nephrotoxins - strict I/O, renally dose meds, avoid nephrotoxic meds   Ileus likely in the setting  - has been an ongoing issue; has not had adequate enteral nutrition in about 3 weeks  Last recorded LBM 11/14  Decrease Opiates  NGT LIWS  Methylnaltrexone  and erythromycin  down cortrak 11/17 and 11/18 without any effect  Stop reglan , neostigmine  11/17 d/t no to effect  Resolved conditions: Acute metabolic encephalopathy  Human metapneumovirus pneumonia CRRT-related  hypotension Hypokalemia Hypomagnesemia Hypoglycemia    Labs at discharge Lab Results  Component Value Date   CREATININE 1.98 (H) 07/26/2024   BUN 66 (H) 07/26/2024   NA 133 (L) 07/26/2024   K 3.3 (L) 07/26/2024   CL 94 (L) 07/26/2024   CO2 23 07/26/2024   Lab Results  Component Value Date   WBC 17.5 (H) 07/26/2024   HGB 7.3 (L) 07/26/2024   HCT 21.8 (L) 07/26/2024   MCV 89.7 07/26/2024   PLT  175 07/26/2024   Lab Results  Component Value Date   ALT 337 (H) 07/25/2024   AST 136 (H) 07/25/2024   ALKPHOS 146 (H) 07/25/2024   BILITOT 0.7 07/25/2024   Lab Results  Component Value Date   INR 0.9 06/25/2024   INR 0.97 09/17/2013    Current radiology studies CT ANGIO AO+BIFEM W & OR WO CONTRAST Addendum Date: 07/25/2024 ADDENDUM REPORT: 07/25/2024 11:54 ADDENDUM: Dr Myer stretch acknowledged the verbal report given to him at 11:25 am on 07/25/2024. Electronically Signed   By: Cordella Banner   On: 07/25/2024 11:54   Result Date: 07/25/2024 CLINICAL DATA:  Claudication EXAM: CT ABDOMEN WITH CONTRAST TECHNIQUE: Multidetector CT imaging of the abdomen was performed using the standard protocol following bolus administration of intravenous contrast. RADIATION DOSE REDUCTION: This exam was performed according to the departmental dose-optimization program which includes automated exposure control, adjustment of the mA and/or kV according to patient size and/or use of iterative reconstruction technique. CONTRAST:  OMNIPAQUE  IOHEXOL  350 MG/ML SOLN COMPARISON:  None Available. FINDINGS: Lower chest: Bilateral small pleural effusions. Subcutaneous air in the lateral posterior chest soft tissues suggestive of recent or indwelling chest tube not visualized on the lower portions of the lungs. Hepatobiliary: No intrahepatic or extrahepatic biliary dilatation. Multiple hepatic cysts. Gallbladder grossly unremarkable. Pancreas: Grossly unremarkable Spleen: No acute abnormality Adrenals/Urinary Tract: Enhance symmetrically, however the left kidney is atretic compared to the right likely from decreased arterial flow. Stomach/Bowel: 2 enteric tubes present in the stomach. Mild-to-moderate small bowel dilatation without definite evidence of obstruction. Small amount of free fluid within the pelvis. Vascular/Lymphatic: The aorta is patent, but heavily calcified with atherosclerotic plaque. The celiac trunk  and SMA are patent without significant stenosis. Minimal ostial calcifications at the origin of the SMA and celiac artery. The right renal artery is widely patent, however 90% stenosis at the origin of the left renal artery secondary to calcifications. The IMA is patent. The common iliac arteries are occluded bilaterally with a combination of calcified and soft plaque. The right internal iliac artery partially reconstitutes. The left internal iliac artery does not significantly reconstitute. The external iliac arteries are occluded. There is reconstitution of the common femoral arteries from the epigastric arteries. The proximal SFA and profunda arteries are patent, but heavily calcified with atherosclerotic plaque and diminutive in caliber. Multifocal calcifications with high-grade stenosis throughout the SFA. Popliteal artery is patent with calcifications. Proximal anterior tibial arteries are patent, but do not continue patency to the foot. Posterior tibial arteries are patent and represent predominant inflow to both feet bilaterally. Peroneal arteries are patent bilaterally, but do not provide significant collateral flow. Musculoskeletal: Degenerative changes throughout the axial skeleton without acute osseous destruction. IMPRESSION: Occluded bilateral common iliac arteries, occluded bilateral external iliac arteries, and occluded internal left iliac artery. The occlusions likely acute on chronic with heavily calcified arterial walls and significant thrombus/soft plaque throughout the lumens of these vessels. Reconstitution of the bilateral common femoral arteries. Multifocal short segment  high-grade stenotic regions throughout the superficial femoral arteries bilaterally. Although visualized proximally, the anterior tibial arteries in both lower extremities become occluded distally. The posterior tibial arteries as the predominant inflow to both feet and although the peroneal arteries are patent bilaterally  the not appear to provide significant collateralization. Electronically Signed: By: Cordella Banner On: 07/25/2024 10:53      Allergies as of 07/26/2024       Reactions   Ace Inhibitors Swelling   Diphenhydramine Other (See Comments)   Hyperactive/jittery    Doxycycline Hyclate Nausea And Vomiting   Codeine Nausea And Vomiting        Medication List     STOP taking these medications    albuterol  108 (90 Base) MCG/ACT inhaler Commonly known as: VENTOLIN  HFA   atorvastatin  10 MG tablet Commonly known as: LIPITOR   benzonatate  100 MG capsule Commonly known as: TESSALON    Biotin 10 MG Caps   budesonide -formoterol  160-4.5 MCG/ACT inhaler Commonly known as: Symbicort    calcium  carbonate 1500 (600 Ca) MG Tabs tablet Commonly known as: OSCAL   diphenhydramine-acetaminophen  25-500 MG Tabs tablet Commonly known as: TYLENOL  PM   furosemide  40 MG tablet Commonly known as: LASIX    gabapentin  300 MG capsule Commonly known as: NEURONTIN    iron polysaccharides 150 MG capsule Commonly known as: NIFEREX   loratadine 10 MG tablet Commonly known as: CLARITIN   LORazepam  1 MG tablet Commonly known as: ATIVAN    losartan  50 MG tablet Commonly known as: COZAAR    multivitamin with minerals Tabs tablet   raloxifene  60 MG tablet Commonly known as: EVISTA    Spiriva  Respimat 2.5 MCG/ACT Aers Generic drug: Tiotropium Bromide    traZODone  100 MG tablet Commonly known as: DESYREL    Vitamin D  50 MCG (2000 UT) tablet       TAKE these medications    acetaminophen  500 MG tablet Commonly known as: TYLENOL  Take 1,000 mg by mouth every 6 (six) hours as needed for moderate pain.   amiodarone  360-4.14 MG/200ML-% Soln Commonly known as: NEXTERONE  PREMIX Inject 30 mg/hr into the vein continuous.   arformoterol  15 MCG/2ML Nebu Commonly known as: BROVANA  Take 2 mLs (15 mcg total) by nebulization 2 (two) times daily.   aspirin  81 MG chewable tablet Chew 1 tablet (81 mg  total) by mouth daily.   bivalirudin  250 mg in sodium chloride  0.9 % 500 mL Inject 1.716 mg/hr into the vein continuous.   Empty Containers Flexible MISC 1 each with ketamine  10 MG/ML SOLN 1,000 mg Inject 37.55 mg/hr into the vein continuous.   fentaNYL  Soln Commonly known as: SUBLIMAZE  Inject 25-100 mcg into the vein every 15 (fifteen) minutes as needed (to maintain RASS & CPOT goal.).   fentaNYL  50 MCG/ML Sosy injection Commonly known as: SUBLIMAZE  Inject 0.5-1 mLs (25-50 mcg total) into the vein once for 1 dose.   heparin  1000 unit/mL Soln injection 1 mL (1,000 Units total) by Dialysis route as needed (in dialysis).   ipratropium-albuterol  0.5-2.5 (3) MG/3ML Soln Commonly known as: DUONEB Take 3 mLs by nebulization every 6 (six) hours as needed.   midodrine  10 MG tablet Commonly known as: PROAMATINE  Place 1 tablet (10 mg total) into feeding tube 3 (three) times daily with meals. Start taking on: July 27, 2024   pantoprazole  40 MG injection Commonly known as: PROTONIX  Inject 40 mg into the vein daily. Start taking on: July 27, 2024   piperacillin -tazobactam 2.25 (2-0.25) g injection Commonly known as: ZOSYN  Inject 2,250 mg (2.25 g total) into the  vein every 8 (eight) hours.   revefenacin  175 MCG/3ML nebulizer solution Commonly known as: YUPELRI  Take 3 mLs (175 mcg total) by nebulization daily. Start taking on: July 27, 2024          Discharged Condition: Guarded Prognosis   Time spent on discharge greater than 40 minutes.  Vital signs at Discharge. Temp:  [97.8 F (36.6 C)-100.6 F (38.1 C)] 99.7 F (37.6 C) (11/21 1546) Pulse Rate:  [76-99] 87 (11/21 1800) Resp:  [18-38] 19 (11/21 1800) BP: (102-147)/(44-84) 147/57 (11/21 1800) SpO2:  [100 %] 100 % (11/21 1800) FiO2 (%):  [40 %] 40 % (11/21 1600) Weight:  [75.5 kg] 75.5 kg (11/21 0404)   Signed: Toma Edwards, DO  Internal Medicine resident 07/26/2024, 6:23 PM  See Amion for CCM  page If no response to pager, please call [phone] until 1900 After 1900 please call [facility] [phone]

## 2024-07-26 NOTE — Plan of Care (Signed)
 Problem: Health Behavior/Discharge Planning: Goal: Ability to manage health-related needs will improve Outcome: Progressing   Problem: Clinical Measurements: Goal: Ability to maintain clinical measurements within normal limits will improve Outcome: Progressing Goal: Will remain free from infection Outcome: Progressing Goal: Diagnostic test results will improve Outcome: Progressing Goal: Respiratory complications will improve Outcome: Progressing Goal: Cardiovascular complication will be avoided Outcome: Progressing   Problem: Activity: Goal: Risk for activity intolerance will decrease Outcome: Progressing   Problem: Nutrition: Goal: Adequate nutrition will be maintained Outcome: Progressing   Problem: Coping: Goal: Level of anxiety will decrease Outcome: Progressing   Problem: Elimination: Goal: Will not experience complications related to bowel motility Outcome: Progressing Goal: Will not experience complications related to urinary retention Outcome: Progressing   Problem: Pain Managment: Goal: General experience of comfort will improve and/or be controlled Outcome: Progressing   Problem: Safety: Goal: Ability to remain free from injury will improve Outcome: Progressing   Problem: Skin Integrity: Goal: Risk for impaired skin integrity will decrease Outcome: Progressing   Problem: Education: Goal: Ability to describe self-care measures that may prevent or decrease complications (Diabetes Survival Skills Education) will improve Outcome: Progressing Goal: Individualized Educational Video(s) Outcome: Progressing   Problem: Coping: Goal: Ability to adjust to condition or change in health will improve Outcome: Progressing   Problem: Fluid Volume: Goal: Ability to maintain a balanced intake and output will improve Outcome: Progressing   Problem: Health Behavior/Discharge Planning: Goal: Ability to identify and utilize available resources and services will  improve Outcome: Progressing Goal: Ability to manage health-related needs will improve Outcome: Progressing   Problem: Metabolic: Goal: Ability to maintain appropriate glucose levels will improve Outcome: Progressing   Problem: Nutritional: Goal: Maintenance of adequate nutrition will improve Outcome: Progressing Goal: Progress toward achieving an optimal weight will improve Outcome: Progressing   Problem: Skin Integrity: Goal: Risk for impaired skin integrity will decrease Outcome: Progressing   Problem: Tissue Perfusion: Goal: Adequacy of tissue perfusion will improve Outcome: Progressing   Problem: Fluid Volume: Goal: Hemodynamic stability will improve Outcome: Progressing   Problem: Clinical Measurements: Goal: Diagnostic test results will improve Outcome: Progressing Goal: Signs and symptoms of infection will decrease Outcome: Progressing   Problem: Respiratory: Goal: Ability to maintain adequate ventilation will improve Outcome: Progressing   Problem: Education: Goal: Knowledge of disease or condition will improve Outcome: Progressing Goal: Knowledge of the prescribed therapeutic regimen will improve Outcome: Progressing Goal: Individualized Educational Video(s) Outcome: Progressing   Problem: Activity: Goal: Ability to tolerate increased activity will improve Outcome: Progressing Goal: Will verbalize the importance of balancing activity with adequate rest periods Outcome: Progressing   Problem: Respiratory: Goal: Ability to maintain a clear airway will improve Outcome: Progressing Goal: Levels of oxygenation will improve Outcome: Progressing Goal: Ability to maintain adequate ventilation will improve Outcome: Progressing   Problem: Education: Goal: Knowledge of disease or condition will improve Outcome: Progressing Goal: Knowledge of the prescribed therapeutic regimen will improve Outcome: Progressing Goal: Individualized Educational  Video(s) Outcome: Progressing   Problem: Activity: Goal: Ability to tolerate increased activity will improve Outcome: Progressing Goal: Will verbalize the importance of balancing activity with adequate rest periods Outcome: Progressing   Problem: Respiratory: Goal: Ability to maintain a clear airway will improve Outcome: Progressing Goal: Levels of oxygenation will improve Outcome: Progressing Goal: Ability to maintain adequate ventilation will improve Outcome: Progressing   Problem: Education: Goal: Ability to demonstrate management of disease process will improve Outcome: Progressing Goal: Ability to verbalize understanding of medication therapies will improve Outcome: Progressing  Goal: Individualized Educational Video(s) Outcome: Progressing

## 2024-07-26 NOTE — Progress Notes (Signed)
 PHARMACY - ANTICOAGULATION CONSULT NOTE  Pharmacy Consult:  Bivalirudin   Indication: atrial fibrillation and internal jugular thombus   Allergies  Allergen Reactions   Ace Inhibitors Swelling   Diphenhydramine Other (See Comments)    Hyperactive/jittery    Doxycycline Hyclate Nausea And Vomiting   Codeine Nausea And Vomiting    Patient Measurements: Height: 5' 1 (154.9 cm) Weight: 75.5 kg (166 lb 7.2 oz) IBW/kg (Calculated) : 47.8 HEPARIN  DW (KG): 63.1  Vital Signs: Temp: 99.7 F (37.6 C) (11/21 1546) Temp Source: Axillary (11/21 1546) BP: 130/52 (11/21 1319) Pulse Rate: 90 (11/21 1319)  Labs: Recent Labs    07/24/24 0523 07/24/24 1309 07/24/24 1548 07/25/24 0421 07/25/24 1848 07/26/24 0409 07/26/24 0722 07/26/24 1632  HGB 6.0* 8.3*  --  8.8*  --  7.3*  --   --   HCT 18.0* 24.6*  --  26.3*  --  21.8*  --   --   PLT 137*  --   --  165  --  175  --   --   APTT 81*  --    < > 84*   < > 101* 72* 82*  CREATININE 1.97*  --   --  2.84*  --  2.03* 1.98*  --    < > = values in this interval not displayed.    Estimated Creatinine Clearance: 22.8 mL/min (A) (by C-G formula based on SCr of 1.98 mg/dL (H)).   Assessment: 22 YOF with recurrent Afib to resume bivalirudin .  She was previously on IV heparin  and was switched to bivalirudin  due to labile heparin  levels.  Med was stopped because it was thought that Afib was secondary to critical illness, which resolved. However patient went back into Afib w/ RVR on 11/7 and bivalirudin  has been resumed.  aPTT supra-therapeutic at 101 sec.  RN reports labs obtain from central line where bivalirudin  is infusing.  RN will move infusion to pIV and recollect aPTT from the central line.  No issue with infusion nor bleeding.  Hemoglobin back up after transfusion and platelet count normalized.  Addendum: Repeat aPTT therapeutic at 72 sec Continue bivalirudin  at 0.026 mg/kg/hr  PM: aPTT 82 sec is therapeutic, trending up. No issues  reported per RN.  Goal of Therapy:  aPTT 50-85 seconds Monitor platelets by anticoagulation protocol: Yes   Plan:  Continue bivalrudin at 0.026 mg/kg/hr (Wt: 66 kg) aPTT BID Daily CBC 07/26/2024, 5:14 PM

## 2024-07-26 NOTE — Progress Notes (Signed)
 Occupational Therapy Treatment Patient Details Name: Katie Waller MRN: [id number] DOB: [birth year] Today's Date: 07/26/2024   History of present illness 78 yo F adm 06/25/24 after found unresponsive in home. Pt hypotensive, septic, metapneumovirus PNA. VDRF 10/21-10/24, 10/25-11/2, 11/8 intubated, 11/10 trach. CRRT 11/11-11/16. 11/11 PTX with chest tube. PMHx: HTN, COPD, chronic 5-6L O2, CHF, NSCLC LLL s/p radiation 05/2024   OT comments  OT session focused in B UE therapeutic exercises (see details below) to maintain joint integrity, improve strength and coordination, and address edema. Pt also educated in techniques for rolling L/R in the bed for improved positioning with pt requiring Max to Total assist. OT also educated pt and pt's daughter in positioning pt's B UE in the be to address edema with daughter verbalizing and demonstrating understanding. Pt made good effort to participate in OT session, but was limited by lethargy and obtunded alertness (suspect due to medication per RN). Acute OT to continue to follow. Post acute discharge, pt will benefit from intensive inpatient skilled rehab services < 3 hours per ay to maximize rehab potential.       If plan is discharge home, recommend the following:  Two people to help with walking and/or transfers;Two people to help with bathing/dressing/bathroom;Assistance with cooking/housework;Assistance with feeding;Direct supervision/assist for medications management;Direct supervision/assist for financial management;Assist for transportation;Help with stairs or ramp for entrance   Equipment Recommendations  Hoyer lift;Wheelchair cushion (measurements OT);Wheelchair (measurements OT);BSC/3in1    Recommendations for Other Services      Precautions / Restrictions Precautions Precautions: Fall;Other (comment) Recall of Precautions/Restrictions: Impaired Precaution/Restrictions Comments: weaning from vent on 11/21, trach, cortrak, no sensation bil  feet, sacral wound Restrictions Weight Bearing Restrictions Per Provider Order: No       Mobility Bed Mobility Overal bed mobility: Needs Assistance Bed Mobility: Rolling Rolling: Max assist, Total assist         General bed mobility comments: Pt requiring Max to Total assist to roll L/R this session. During last session pt rolled with Mod assist. Suspected decreased funcitonal level is due to pt with decreased alertness, which is suspected due to medication per RN    Transfers Overall transfer level: Needs assistance                 General transfer comment: not addressed this session     Balance                                           ADL either performed or assessed with clinical judgement   ADL Overall ADL's : Needs assistance/impaired Eating/Feeding: NPO                                     General ADL Comments: ADLs not addressed this session; pt conitnues to require Max to Total assist +2 for ADLs from bed level at this time    Extremity/Trunk Assessment Upper Extremity Assessment Upper Extremity Assessment: Right hand dominant;Generalized weakness;LUE deficits/detail RUE Deficits / Details: generalized weakness; AAROM limited to approx. 90 degrees secondary to lines and leads; decreased coordination; decreased proprioception; difficult to fully assess sensation, but appears otherwise WFL; edmatous hand, wrist, forearm (L > R); noted redness on forearm and upper arm with RN notified and stating he is aware; small open sore on dorsal side of thumb with RN  notified and stating he is aware and has ordered/is awaiting materials to care for and cover area RUE Sensation: decreased proprioception RUE Coordination: decreased fine motor;decreased gross motor LUE Deficits / Details: generalized weakness; AAROM limited to approx. 90 degrees secondary to lines and leads; decreased coordination; decreased proprioception; difficult to fully  assess sensation, but appears otherwise WFL; edmatous hand, wrist, forearm (L > R); noted redness on forearm and upper arm with RN notified and stating he is aware LUE Sensation: decreased proprioception LUE Coordination: decreased gross motor;decreased fine motor   Lower Extremity Assessment Lower Extremity Assessment: Defer to PT evaluation        Vision   Additional Comments: To be further tested in functional contexts in furture sessions   Perception     Praxis     Communication Communication Communication: Impaired Factors Affecting Communication: Trach/intubated   Cognition Arousal: Obtunded, Suspect due to medications Behavior During Therapy: Flat affect Cognition: Difficult to assess Difficult to assess due to: Tracheostomy           OT - Cognition Comments: Pt with eyes closed on arrival, but opened eyes with with stimulation and briefly opening eyes an dlooking toward OT with verbal or tactile cues throughout session. Pt with very limited responses to questions with head nods/shakes.                 Following commands: Impaired Following commands impaired: Follows one step commands with increased time, Follows one step commands inconsistently (Pt with obtunded alertness this session, suspect due to medication per RN)      Cueing   Cueing Techniques: Verbal cues, Tactile cues  Exercises Exercises: General Upper Extremity, Hand exercises (Pt making good attempt to participate in exercises with cues, but quickly/frequently falling asleep during exercises) General Exercises - Upper Extremity Shoulder Flexion: AAROM, PROM, Strengthening, Both, Other reps (comment), Supine (2 sets of 10 reps; with HOB elevated; to maintin joint integrity; to increase activity tolerance; to address edema) Shoulder Horizontal ABduction: AAROM, PROM, Strengthening, Both, Other reps (comment), Supine (2 sets of 10 reps; with HOB elevated; to maintin joint integrity; to increase  activity tolerance; to address edema) Elbow Flexion: AAROM, PROM, Strengthening, Both, Other reps (comment), Supine (2 sets of 10 reps; with HOB elevated; to maintin joint integrity; to increase activity tolerance; to address edema) Elbow Extension: AAROM, PROM, Strengthening, Both, Other reps (comment), Supine (2 sets of 10 reps; with HOB elevated; to maintin joint integrity; to increase activity tolerance; to address edema) Wrist Flexion: PROM, AAROM, Both, Other reps (comment), Supine (2 sets of 10 reps; with HOB elevated; to maintin joint integrity; to increase activity tolerance; to address edema) Wrist Extension: PROM, AAROM, Strengthening, Both, Other reps (comment), Supine (2 sets of 10 reps; with HOB elevated; to maintin joint integrity; to increase activity tolerance; to address edema) Digit Composite Flexion: PROM, Both, Other reps (comment), Supine (2 sets of 10 reps; with HOB elevated; to maintin joint integrity; to increase activity tolerance; to address edema) Composite Extension: PROM, Both, Supine (2 sets of 10 reps; with HOB elevated; to maintin joint integrity; to address edema) Hand Exercises Forearm Supination: AAROM, PROM, Strengthening, Both, Other reps (comment), Supine (2 sets of 10 reps; with HOB elevated; to maintin joint integrity; to increase activity tolerance; to address edema) Forearm Pronation: PROM, AAROM, Strengthening, Both, Other reps (comment), Supine (2 sets of 10 reps; with HOB elevated; to maintin joint integrity; to increase activity tolerance; to address edema)    Shoulder Instructions  General Comments OT educated pt and her daughter in positioning of B UE to address edema with daughter verbalizing and demonstrating understanding through teach back. Pt currently weaning from vent. O2 sat at 100% on vent at 40% FiO2, BP 130/52, and HR in the 90s throughout session. Pt's daughter present and supportive throughout session. RN present during a portion of  session.    Pertinent Vitals/ Pain       Pain Assessment Pain Assessment: Faces Faces Pain Scale: No hurt Pain Intervention(s): Monitored during session  Home Living                                          Prior Functioning/Environment              Frequency  Min 2X/week        Progress Toward Goals  OT Goals(current goals can now be found in the care plan section)  Progress towards OT goals: Not progressing toward goals - comment (currently limited by medical condition and decreased alertness, suspect due to medications per RN)  Acute Rehab OT Goals Patient Stated Goal: pt unable to state  Plan      Co-evaluation                 AM-PAC OT 6 Clicks Daily Activity     Outcome Measure   Help from another person eating meals?: Total (NPO) Help from another person taking care of personal grooming?: A Lot Help from another person toileting, which includes using toliet, bedpan, or urinal?: Total Help from another person bathing (including washing, rinsing, drying)?: A Lot Help from another person to put on and taking off regular upper body clothing?: A Lot Help from another person to put on and taking off regular lower body clothing?: Total 6 Click Score: 9    End of Session Equipment Utilized During Treatment: Oxygen ;Other (comment) (vent)  OT Visit Diagnosis: Other abnormalities of gait and mobility (R26.89);Muscle weakness (generalized) (M62.81);Ataxia, unspecified (R27.0)   Activity Tolerance Patient limited by lethargy   Patient Left in bed;with call bell/phone within reach;with family/visitor present;Other (comment) (with B UE elevated with pillows to address edema)   Nurse Communication Mobility status;Other (comment) (Vital sigsn; pt with open sore on dorsal side of Right thumb and noted redness on B UEs)        Time: 8751-8682 OT Time Calculation (min): 29 min  Charges: OT General Charges $OT Visit: 1 Visit OT  Treatments $Therapeutic Activity: 8-22 mins $Therapeutic Exercise: 8-22 mins  Margarie Rockey HERO., OTR/L, MA Acute Rehab [phone]   Margarie FORBES Horns 07/26/2024, 4:58 PM

## 2024-07-26 NOTE — Progress Notes (Signed)
 PHARMACY - ANTICOAGULATION CONSULT NOTE  Pharmacy Consult for bivalirudin  Indication: Afib and IJ thrombus  Labs: Recent Labs    07/23/24 0534 07/23/24 1659 07/24/24 0523 07/24/24 1309 07/24/24 1548 07/25/24 0421 07/25/24 1848 07/26/24 0038  HGB 7.1*   < > 6.0* 8.3*  --  8.8*  --   --   HCT 20.6*   < > 18.0* 24.6*  --  26.3*  --   --   PLT 151  --  137*  --   --  165  --   --   APTT 83*   < > 81*  --    < > 84* 46* 87*  CREATININE 2.47*  --  1.97*  --   --  2.84*  --   --    < > = values in this interval not displayed.   Assessment/Plan:  78yo female therapeutic on bival after rate change. Will continue infusion at current rate of 0.026 mg/kg/hr and confirm stable with am labs.  Marvetta Dauphin, PharmD, BCPS 07/26/2024 1:37 AM

## 2024-07-26 NOTE — Progress Notes (Signed)
 NAMEAMARIONNA Waller, MRN:  [id number], DOB:  [birth year], LOS: 31 ADMISSION DATE:  06/25/2024, CONSULTATION DATE:  06/25/2024 REFERRING MD:  Theodoro SAUNDERS, CHIEF COMPLAINT:  AMS, Sepsis, AR   History of Present Illness:  Katie Waller is a 78 yo female with past medical history significant for HTN, COPD, acute on chronic hypoxic, hypercapnic respiratory failure on 5-6L [place] baseline, diastolic heart failure, SCC Left lower lobe s/p radiation in September 2025, who presented via EMS after being found unresponsive at home. Code stroke called. On arrival to ED patient emergently intubated for acute respiratory distress and airway protection. CT Head with no ICH or large infarct, CTA head and neck with no LVO. CXR concerning for a Pneumonia with visible BL lung apices on CTA demonstrating infection/inflammation. Code stroke cancelled with presentation suspicious for sepsis. Patient received 30ml/kg crystalloid, pan cultured, and started empirically on Cefepime /Vanc/Azithromycin . Of note, family reports patient has recently not felt well and likely has had a cold. PCCM consulted for ICU admission.   On exam in ED, patient febrile to >101 and hypotensive, likely multifactorial in setting of presumed sepsis and recent sedation on induction with underlying heart disease. Norepinephrine  gtt was initiated for MAPs < 65 and patient was transferred to ICU with plans to gain central access for vasopressor administration and arterial line placement for hemodynamic monitoring.  Pertinent  Medical History   Past Medical History:  Diagnosis Date   Abnormal finding on EKG 09/19/2013   Acute on chronic respiratory failure with hypercapnia (HCC)    Acute respiratory failure (HCC) 06/08/2016   Anxiety 11/09/2020   Arthritis    Asthma    Carotid bruit 11/09/2020   Chronic diastolic CHF (congestive heart failure) (HCC) 07/28/2017   Chronic obstructive pulmonary disease, unspecified (HCC) 11/09/2020   COPD, group D,  by GOLD 2017 classification (HCC) 09/17/2013   Cough 11/09/2020   Decreased estrogen level 11/09/2020   Diabetes (HCC)    Dyspnea    Edema 11/09/2020   Essential hypertension 09/18/2013   Goals of care, counseling/discussion    Hardening of the aorta (main artery of the heart) 11/09/2020   Heart disease    History of radiation therapy    Left Lung- 06/20/23-06/26/23- Dr. 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   Transient ischemic attack 11/09/2020   Venous stasis of both lower extremities 06/13/2016     Significant Hospital Events: Including procedures, antibiotic start and stop dates in addition to other pertinent events   10/21 Found unresponsive at home->ED>Code stroke>intubated for ARD/airway protection>CTH/CTA negative>Code stroke cancelled>Hypotensive>started on pressors>Admit to ICU  Arterial line placed 10/21 10/22 and 10/23 unsuccessful extubation 10/24: Despite maximal Precedex  and fentanyl  pushes, unable to safely and comfortably clean the patient up so added prn Versed  and started fentanyl  infusion. Rectal tube added for diarrhea.  10/24: 2/3 BC resulted in staph capitis/epidermitis, extubated on BiPAP 10/25: patient remained on BiPAP for 24 hours, when she came off of BiPAP, could not tolerate became tachypneic, tachycardic went into respiratory distress requiring endotracheal reintubation 10/29: unsuccessful SBT, went  briefly back into RVR 10/30: unsuccessful weaning from sedation as BP/HR increased considerably 11/2:  extubated to HFNC 11/8: progressively became more hypoxic with decreased respiratory drive prompting ABG this a.m. which revealed toxic hypercapnic respiratory failure.  Transferred back to ICU for impending intubation; tried draining pleural spaces but still tired out 11/10: trach 11/10: aggressive diuresis with Lasix  80 followed by 160 followed by 260 + metolazone  and bicarb - no response>Nephrology consulted 11/11: nephro consult. LE weakness noted on exam with inability to move - neuro consulted, MRI C and T spine ordered. CT pulled yesterday, worsening ptx>2 R CT placed, ptx improved. CRRT started. Hypotensive>briefly on Levo 11/12: Levo stopped. MRI C spine>cervical stenosis.  11/13 Off TF.  11/14: Worsening hypotension in setting of tension pneumothorax. Resolved after chest tube was flushed with improved pressor requirement. Levophed  now 5 mcg/min 11/15 back on TF 11/16 started zosyn  for leukocytosis 11/17 TF held. Started on Ketamine . Received 1 pRBC 11/18 Art line came out  11/19 ABI ordered > undetectable doppler waveforms bilateral PTA/DPA > 11/20 CTA > Not a candidate for aortobifemoral bypass versus ax bifemora  Interim History / Subjective:  O/N events: 100.6   Patient is evaluated at bedside, trach on vent support.  Objective    Blood pressure (!) 141/55, pulse 90, temperature 99.6 F (37.6 C), temperature source Axillary, resp. rate (!) 25, height 5' 1 (1.549 m), weight 75.5 kg, SpO2 100%. CVP:  [14 mmHg-21 mmHg] 21 mmHg  Vent Mode: PRVC FiO2 (%):  [40 %] 40 % Set Rate:  [25 bmp] 25 bmp Vt Set:  [380 mL] 380 mL PEEP:  [5 cmH20] 5 cmH20 Pressure Support:  [10 cmH20] 10 cmH20 Plateau Pressure:  [17 cmH20-19 cmH20] 17 cmH20   Intake/Output Summary (Last 24 hours) at 07/26/2024 0647 Last data filed at 07/26/2024 0600 Gross per 24 hour  Intake 2411.92 ml  Output 3075 ml  Net -663.08 ml   Filed Weights   07/25/24 0443 07/25/24 1456 07/26/24 0404  Weight: 74.1 kg  74.1 kg 75.5 kg    Examination: General: ill appearing  HENT: NGT turned off  Neck: Trach in place  Lungs: Rhonchorus on MV  Cardiovascular: irregular  Abdomen: distended but soft Extremities: Cold bilateral feet with darkening skin changes, no dp and pT pulses palpated.  Neuro: Awakens, does not follow commands  GU: No foley   UOP 0   Chest tube: ~20 cc  NGT: ~75 cc I/O -752  Pertinent Labs: WBC 17.5 (19.6), Hgb 7.3 (8.8)  Na 132, K 3.1, Scr 2.03 (2.84) Mag 1.7  Trach Aspirate 11/17 showed few budding yeast   Resolved problem list  Acute metabolic encephalopathy  Human metapneumovirus pneumonia CRRT-related hypotension Hypokalemia Hypomagnesemia Hypoglycemia    Assessment and Plan   Acute on chronic hypoxic & hypercapnic respiratory failure s/p trach 11/9 Fluid overload - baseline weight per daughter around 146-150 lbs BL effusions: s/p thoracentesis 11/8 with 600 cc transudative fluid Right PTX s/p chest tube: #1 R pigtail CT pulled 11/10; #2 R CT remained; immediate recurring ptx>R pigtail re-placed>both CT to - . 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  - Tracheal Aspirate showed few yeast, pending   Shock - Multifactorial in setting of CRRT, sedation, sepsis.  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 There is hyperpigmentation/darkening of the skin dorsal and ventral surfaces of the feet (images on the chart).  - US  ABI/TBI with undetectable doppler waveforms bilateral PTA/DPA.  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 -Appreciate NS's management; no plans for intervention of cervical stenosis given overall condition -PT, OT as patient tolerates    Afib with RVR, new onset, paroxysmal Chronic HFpEF  HTN -IV amiodarone  & bival, Previously had difficulty with heparin  dosing due to very labile heparin  levels  -tele monitoring -monitor electrolytes and replete as needed    RIJ CVL associated blood clot - Continue bivalirudin    AOC anemia  S/p 1 pRBC 11/17, s/p 1 pRBC 11/19  - Monitor Hgb daily  - Transfuse if Hgb < 7 - Consider ESA per nephrology  - no IV iron with concern for active infection   Anuric AKI Likely 2/2 sepsis/pneumonia and the use of antibiotics  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], 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, 11/20] - Nephrology following  - Midodrine  10 mg TID, can increase it to Midodrine  15 TID if needed for BP support  - Bicarb gtt stopped - avoid nephrotoxins - strict I/O - renally dose meds, avoid nephrotoxic meds   Ileus - has been an ongoing issue; has not had adequate enteral nutrition in about 3 weeks  Last recorded LBM 11/14  Decrease Opiates  NGT to wall suction Methylnaltrexone  and erythromycin  down cortrak 11/17 and 11/18 without any effect  Stop reglan , neostigmine  11/17 d/t no to effect Unable to wean down on Fent    GOC -  Multiorgan failure with multiple life sustaining interventions  - Guarded Prognosis     Labs   CBC: Recent Labs  Lab 07/22/24 0308 07/23/24 0534 07/24/24 0456 07/24/24 0523 07/24/24 1309 07/25/24 0421 07/26/24 0409  WBC 20.4* 18.7*  --  16.1*  --  19.6* 17.5*  HGB 6.6* 7.1* 6.8* 6.0* 8.3* 8.8* 7.3*  HCT 20.3* 20.6* 20.0* 18.0* 24.6* 26.3* 21.8*  MCV 95.3 89.6  --  93.3  --  89.8 89.7  PLT PLATELET CLUMPS NOTED ON SMEAR, UNABLE TO ESTIMATE 151  --  137*  --  165 175    Basic Metabolic Panel: Recent Labs  Lab 07/22/24 0308 07/22/24 2012 07/23/24 0534 07/24/24 0456 07/24/24 0523 07/25/24 0421 07/26/24 0409  NA 135 131* 133* 129* 133* 130* 132*  K 4.8 5.1 5.1 3.3* 3.1* 3.9 3.1*  CL 90* 84* 86*  --  91* 92* 86*  CO2 33* 30 28  --  26 22 21*  GLUCOSE 142* 213* 125*  --  221* 149* 266*  BUN 42* 64* 73*  --  53* 93* 56*  CREATININE 1.36* 2.10* 2.47*  --  1.97* 2.84* 2.03*  CALCIUM  7.4* 7.0* 7.8*  --  7.3* 8.3* 7.2*  MG 2.1  --  2.1  --  1.6* 1.9 1.7  PHOS 4.6 6.4* 6.5*  --  3.2 3.4 2.0*   GFR: Estimated Creatinine Clearance: 22.3 mL/min (A) (by C-G formula based on SCr of 2.03 mg/dL (H)). Recent Labs  Lab 07/23/24 0534 07/24/24 0523 07/25/24 0421 07/26/24 0409  WBC 18.7* 16.1* 19.6* 17.5*    Liver Function Tests: Recent Labs  Lab 07/22/24 2012 07/23/24 0534 07/24/24 0523 07/25/24 0421 07/26/24 0409  AST  --   --   --  136*  --   ALT  --   --   --  337*  --   ALKPHOS  --   --   --  146*  --   BILITOT  --   --   --  0.7  --   PROT  --   --   --  5.3*  --   ALBUMIN  <1.5* <1.5* <1.5* <1.5* 1.6*   No results for input(s): LIPASE, AMYLASE in the last 168 hours. No results for input(s): AMMONIA in the last 168 hours.  ABG    Component Value Date/Time   PHART 7.370 07/24/2024 0456   PCO2ART 53.3 (H) 07/24/2024 0456   PO2ART 107 07/24/2024 0456   HCO3 30.9 (H) 07/24/2024 0456   TCO2 32 07/24/2024 0456   ACIDBASEDEF 2.0 07/19/2024 2016   O2SAT 98  07/24/2024 0456     Coagulation Profile: No results for input(s): INR, PROTIME in the last 168 hours.  Cardiac Enzymes: No results for input(s): CKTOTAL, CKMB, CKMBINDEX, TROPONINI in the last 168 hours.  HbA1C: Hgb A1c MFr Bld  Date/Time Value Ref Range Status  06/26/2024 04:47 AM 5.6 4.8 - 5.6 % Final    Comment:    (NOTE) Diagnosis of Diabetes The following HbA1c ranges recommended by the American Diabetes Association (ADA) may be used as an aid in the diagnosis of diabetes mellitus.  Hemoglobin             Suggested A1C NGSP%              Diagnosis  <5.7                   Non Diabetic  5.7-6.4                Pre-Diabetic  >6.4                   Diabetic  <7.0                   Glycemic control for                       adults with diabetes.    09/17/2013 01:57 PM 6.1 (H) <5.7 % Final    Comment:    (NOTE)                                                                       According to the ADA Clinical Practice Recommendations  for 2011, when HbA1c is used as a screening test:  >=6.5%   Diagnostic of Diabetes Mellitus           (if abnormal result is confirmed) 5.7-6.4%   Increased risk of developing Diabetes Mellitus References:Diagnosis and Classification of Diabetes Mellitus,Diabetes Care,2011,34(Suppl 1):S62-S69 and Standards of Medical Care in         Diabetes - 2011,Diabetes Care,2011,34 (Suppl 1):S11-S61.    CBG: Recent Labs  Lab 07/25/24 1155 07/25/24 1534 07/25/24 1943 07/25/24 2343 07/26/24 0323  GLUCAP 138* 114* 141* 125* 103*    Review of Systems:   As above   Past Medical History:  She,  has a past medical history of Abnormal finding on EKG (09/19/2013), Acute on chronic respiratory failure with hypercapnia (HCC), Acute respiratory failure (HCC) (06/08/2016), Anxiety (11/09/2020), Arthritis, Asthma, Carotid bruit (11/09/2020), Chronic diastolic CHF (congestive heart failure) (HCC) (07/28/2017), Chronic obstructive pulmonary  disease, unspecified (HCC) (11/09/2020), COPD, group D, by GOLD 2017 classification (HCC) (09/17/2013), Cough (11/09/2020), Decreased estrogen level (11/09/2020), Diabetes (HCC), Dyspnea, Edema (11/09/2020), Essential hypertension (09/18/2013), Goals of care, counseling/discussion, Hardening of the aorta (main artery of the heart) (11/09/2020), Heart disease, History of radiation therapy, Hypertension, Hypertensive heart failure (HCC) (11/09/2020), Hypoxia (11/09/2020), Insomnia (11/09/2020), Iron deficiency anemia (11/09/2020), Large liver (11/09/2020), Near syncope (09/17/2013), Osteoporosis (11/09/2020), Palliative care encounter, Prediabetes (09/19/2013), Pure hypercholesterolemia (11/09/2020), Raynaud's disease (11/09/2020), Sinus tachycardia (09/18/2013), Skin sensation disturbance (11/09/2020), Smoker (09/17/2013), 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   Surgical History:   Past Surgical History:  Procedure Laterality Date   BRONCHIAL BIOPSY  05/25/2023   Procedure: BRONCHIAL BIOPSIES;  Surgeon: Brenna Adine CROME, DO;  Location: [facility];  Service: Pulmonary;;   BRONCHIAL NEEDLE ASPIRATION BIOPSY  05/25/2023   Procedure: BRONCHIAL NEEDLE ASPIRATION BIOPSIES;  Surgeon: Brenna Adine CROME, DO;  Location: [facility];  Service: Pulmonary;;   FIDUCIAL MARKER PLACEMENT  05/25/2023   Procedure: FIDUCIAL MARKER PLACEMENT;  Surgeon: Brenna Adine CROME, DO;  Location: [facility];  Service: Pulmonary;;   KNEE SURGERY Left      Social History:   reports that she quit smoking about 7 years ago. Her smoking use included cigarettes. She started smoking about 7 years ago. She has a 55 pack-year smoking history. She has never used smokeless tobacco. She reports that she does not drink alcohol and does not use drugs.   Family History:  Her family history includes Hyperlipidemia in her father and mother;  Hypertension in her father and mother.   Allergies Allergies  Allergen Reactions   Ace Inhibitors Swelling   Diphenhydramine Other (See Comments)    Hyperactive/jittery    Doxycycline Hyclate Nausea And Vomiting   Codeine Nausea And Vomiting     Home Medications  Prior to Admission medications   Medication Sig Start Date End Date Taking? Authorizing Provider  acetaminophen  (TYLENOL ) 500 MG tablet Take 1,000 mg by mouth every 6 (six) hours as needed for moderate pain.   Yes [provider]  albuterol  (PROVENTIL  HFA;VENTOLIN  HFA) 108 (90 BASE) MCG/ACT inhaler Inhale 2 puffs into the lungs every 6 (six) hours as needed for wheezing or shortness of breath. 09/19/13  Yes Johnson, Clanford L, MD  aspirin  81 MG chewable tablet Chew 1 tablet (81 mg total) by mouth daily. 09/19/13  Yes Johnson, Clanford L, MD  atorvastatin  (LIPITOR) 10 MG tablet TAKE 1 TABLET BY MOUTH EVERY DAY 07/21/23  Yes Krasowski, Robert J, MD  benzonatate  (TESSALON )  100 MG capsule Take 100 mg by mouth 3 (three) times daily as needed for cough.    Yes [provider]  Biotin 10 MG CAPS Take 10 mg by mouth daily.   Yes [provider]  budesonide -formoterol  (SYMBICORT ) 160-4.5 MCG/ACT inhaler Inhale 2 puffs into the lungs 2 (two) times daily. 08/23/19  Yes Mannam, Praveen, MD  calcium  carbonate (OSCAL) 1500 (600 Ca) MG TABS tablet Take 600 mg of elemental calcium  by mouth daily.   Yes [provider]  Cholecalciferol (VITAMIN D ) 50 MCG (2000 UT) tablet Take 2,000 Units by mouth daily.   Yes [provider]  diphenhydramine-acetaminophen  (TYLENOL  PM) 25-500 MG TABS tablet Take 1 tablet by mouth at bedtime.   Yes [provider]  furosemide  (LASIX ) 40 MG tablet Take 40 mg by mouth daily.   Yes [provider]  gabapentin  (NEURONTIN ) 300 MG capsule Take 900 mg by mouth at bedtime.   Yes [provider]  iron polysaccharides (NIFEREX) 150 MG capsule Take 150 mg  by mouth daily. 11/05/20  Yes [provider]  loratadine (CLARITIN) 10 MG tablet Take 10 mg by mouth daily as needed for allergies.   Yes [provider]  LORazepam  (ATIVAN ) 1 MG tablet Take 1 tablet (1 mg total) by mouth every 6 (six) hours as needed for anxiety. 08/07/17  Yes Drusilla Sabas RAMAN, MD  losartan  (COZAAR ) 50 MG tablet Take 50 mg by mouth daily.   Yes [provider]  Multiple Vitamin (MULTIVITAMIN WITH MINERALS) TABS tablet Take 1 tablet by mouth daily.   Yes [provider]  raloxifene  (EVISTA ) 60 MG tablet Take 60 mg by mouth every morning. 08/07/20  Yes [provider]  Tiotropium Bromide  Monohydrate (SPIRIVA  RESPIMAT) 2.5 MCG/ACT AERS Inhale 1 puff into the lungs daily.   Yes [provider]  traZODone  (DESYREL ) 100 MG tablet Take 1 tablet (100 mg total) by mouth at bedtime as needed for sleep. 08/07/17  Yes Drusilla Sabas RAMAN, MD     Critical care time:

## 2024-07-26 NOTE — Progress Notes (Signed)
  Progress Note    07/26/2024 10:39 AM * No surgery found *  Subjective:  intubated and sedated   Vitals:   07/26/24 0800 07/26/24 0900  BP: (!) 134/50 (!) 130/55  Pulse: 89 96  Resp: (!) 38 (!) 22  Temp:    SpO2: 100% 100%   Physical Exam: Lungs:  mechanical thrombectomy Extremities:  mottled up to the shin BLE; feet are cold, toes are necrotic; absent femoral pulses Neurologic: sedated  CBC    Component Value Date/Time   WBC 17.5 (H) 07/26/2024 0409   RBC 2.43 (L) 07/26/2024 0409   HGB 7.3 (L) 07/26/2024 0409   HCT 21.8 (L) 07/26/2024 0409   PLT 175 07/26/2024 0409   MCV 89.7 07/26/2024 0409   MCH 30.0 07/26/2024 0409   MCHC 33.5 07/26/2024 0409   RDW 17.2 (H) 07/26/2024 0409   LYMPHSABS 0.3 (L) 06/25/2024 0922   MONOABS 0.5 06/25/2024 0922   EOSABS 0.0 06/25/2024 0922   BASOSABS 0.0 06/25/2024 0922    BMET    Component Value Date/Time   NA 133 (L) 07/26/2024 0722   K 3.3 (L) 07/26/2024 0722   CL 94 (L) 07/26/2024 0722   CO2 23 07/26/2024 0722   GLUCOSE 158 (H) 07/26/2024 0722   BUN 66 (H) 07/26/2024 0722   CREATININE 1.98 (H) 07/26/2024 0722   CALCIUM  8.1 (L) 07/26/2024 0722   GFRNONAA 26 (L) 07/26/2024 0722   GFRAA 44 (L) 12/24/2018 1801    INR    Component Value Date/Time   INR 0.9 06/25/2024 0922     Intake/Output Summary (Last 24 hours) at 07/26/2024 1039 Last data filed at 07/26/2024 0900 Gross per 24 hour  Intake 2441.93 ml  Output 3075 ml  Net -633.07 ml     Assessment/Plan:  78 y.o. 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   Dr. Gaylene reviewed the CTA results with the patient's daughter yesterday.  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  She would not tolerate open aorta surgery or  ax bifemoral bypass at this time.  Only options include bilateral above-the-knee amputations or palliative.  The patient's daughter is aware of the above.  Donnice Sender, PA-C [facility] [phone] 07/26/2024 10:39 AM

## 2024-07-26 NOTE — Progress Notes (Signed)
 PHARMACY - TOTAL PARENTERAL NUTRITION CONSULT NOTE  Indication: Ileus, Intolerance to EN  Patient Measurements: Height: 5' 1 (154.9 cm) Weight: 75.5 kg (166 lb 7.2 oz) IBW/kg (Calculated) : 47.8 TPN AdjBW (KG): 53.6 Body mass index is 31.45 kg/m.  Assessment:  70 YOF presented on 10/21 with PNA/COPD exacerbation, hospitalization complicated by ARF requiring CRRT, PTX, cervical stenosis/paralysis and Afib RVR.  Patient initially tolerated TF and was having diarrhea requiring fiber.  Ileus developed around 11/12 and TF/fiber were held, started on Reglan , Relistor  and neostigmine .  Trickle TF resumed on 11/15 and then held 11/17 given significant NG output and aspiration.  Pharmacy consulted to dose TPN given prolonged ileus.  Glucose / Insulin : hx preDM, A1c 5.6% - CBGs < 180 Used 12 units SSI in the past 24 hrs Electrolytes: post-HD labs - low Na/CL, K 3.3 (goal >/= 4), low iCa and 2gm given 11/19, Mag 1.7, Phos low 1.9 (goal >/= 2), others WNL Renal: CRRT 11/11 >> 11/16, HD 11/18 - BUN 66 Hepatic: LFTs elevated (not d/t TPN), lipase 99, tbili WNL, albumin  < 1.5 Intake / Output; MIVF: NG 0mL, stool 0mL GI meds: PRN erythromycin  and Relistor  GI Imaging: none since TPN initiation GI Surgeries / Procedures: none since TPN initiation  Central access: PICC placed 07/12/24 TPN start date: 07/22/24  Nutritional Goals: Goal concentrated TPN rate is 60 mL/hr to provide 1738 kCal and 115g AA per day  RD Estimated Needs Total Energy Estimated Needs: 1700-1900 Total Protein Estimated Needs: 110-125 grams Total Fluid Estimated Needs: >/=1.5L  Current Nutrition:  TPN  Plan:  Continue concentrated TPN at goal rate 60 mL/hr at 1800 to provide 100% of needs Electrolytes in TPN: Na 150 mEq/L, increase K to 14mEq/L (= 20 mEq/day), Ca 57mEq/L, increase Mg to 20mEq/L, add low dose Phos 67mmol/L (= 7 mmol/day), max CL Add standard MVI and trace elements to TPN (D/C BComplex) Mag sulfate 2gm  IV KPhos 15mmol IV Reduce to moderate SSI Q4H  Standard TPN labs Mon/Thurs.  Renal function panel BID and daily Mag per Renal  Katie Waller, PharmD, BCPS, BCCCP 07/26/2024, 10:11 AM

## 2024-07-26 NOTE — Progress Notes (Signed)
 [facility] NEPHROLOGY PROGRESS NOTE  Assessment/ Plan: Pt is a 78 y.o. 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   # Severe anuric AKI: Baseline creatinine normal.  Likely ATN in the setting of sepsis.  Complicated by fluid overload.  CRRT on 11/11-16.  No signs of renal recovery at this time  - Plan for dialysis again tomorrow and then likely Monday.  Will use additional sessions of dialysis as able based on nursing availability - Continue midodrine  5 mg 3 times daily and uptitrate as needed -Try to maintain on MWF schedule for dialysis -Long-term candidacy for dialysis questionable - Continue to monitor for renal recovery -Discussed we may have to return to CRRT if we fall behind on fluid. We discussed this is not an ideal solution and unlikely to change overall trajectory. Family remains focused on aggressive measures.    # Acute on chronic hypoxic respiratory failure, metapneumovirus pneumonia: Initially treated with broad-spectrum antibiotics which was discontinued later.  Patient required tracheostomy on 11/9 and managing by pulmonary team.   # Anemia: Continue with transfusions intermittently.  Could consider ESA  # Hypoalbuminemia: Will provide IV albumin  intermittently with dialysis as needed to help with hypotension  # Shock: Multifactorial.  Currently not requiring pressors.  Continue midodrine  and increase if needed  # Hyponatremia: Associate with fluid accumulation.  Management with dialysis  Discussed with the patient's daughter, ICU team  Subjective: Tolerated dialysis yesterday.  Some low blood pressures but was able to get 3 L  off.  Objective Vital signs in last 24 hours: Vitals:   07/26/24 0700 07/26/24 0750 07/26/24 0800 07/26/24 0900  BP: (!) 128/57  (!) 134/50 (!) 130/55  Pulse: 90 86 89 96  Resp: (!) 23 (!) 22 (!) 38 (!) 22  Temp:  100 F (37.8 C)    TempSrc:  Axillary    SpO2: 100% 100% 100% 100%  Weight:      Height:       Weight change: 0 kg  Intake/Output Summary (Last 24 hours) at 07/26/2024 1019 Last data filed at 07/26/2024 0900 Gross per 24 hour  Intake 2441.93 ml  Output 3075 ml  Net -633.07 ml       Labs: RENAL PANEL Recent Labs  Lab 07/22/24 2012 07/23/24 0534 07/24/24 0456 07/24/24 0523 07/25/24 0421 07/26/24 0409 07/26/24 0722  NA 131* 133* 129* 133* 130* 132* 133*  K 5.1 5.1 3.3* 3.1* 3.9 3.1* 3.3*  CL 84* 86*  --  91* 92* 86* 94*  CO2 30 28  --  26 22 21* 23  GLUCOSE 213* 125*  --  221* 149* 266* 158*  BUN 64* 73*  --  53* 93* 56* 66*  CREATININE 2.10* 2.47*  --  1.97* 2.84* 2.03* 1.98*  CALCIUM  7.0* 7.8*  --  7.3* 8.3* 7.2* 8.1*  MG  --  2.1  --  1.6* 1.9 1.7 1.7  PHOS 6.4* 6.5*  --  3.2 3.4 2.0* 1.9*  ALBUMIN  <1.5* <1.5*  --  <1.5* <1.5* 1.6*  --     Liver Function Tests: Recent Labs  Lab 07/24/24 0523 07/25/24 0421 07/26/24 0409  AST  --  136*  --   ALT  --  337*  --   ALKPHOS  --  146*  --   BILITOT  --  0.7  --   PROT  --  5.3*  --   ALBUMIN  <1.5* <1.5* 1.6*   No results for input(s): LIPASE, AMYLASE in the last 168 hours.  No results for input(s): AMMONIA in the last 168 hours. CBC: Recent Labs    07/11/24 0228 07/12/24 0841 07/24/24 0456 07/24/24 0523 07/24/24 1309 07/25/24 0421 07/26/24 0409  HGB 8.9*   < > 6.8* 6.0* 8.3* 8.8* 7.3*  MCV 100.0   < >  --  93.3  --  89.8 89.7  VITAMINB12 1,029*  --   --   --   --   --   --   FOLATE >20.0  --   --   --   --   --   --   FERRITIN 762*  --   --   --   --   --   --   TIBC 227*  --   --   --   --   --   --   IRON 44  --   --   --   --   --   --   RETICCTPCT 1.7  --   --   --   --    --   --    < > = values in this interval not displayed.    Cardiac Enzymes: No results for input(s): CKTOTAL, CKMB, CKMBINDEX, TROPONINI in the last 168 hours. CBG: Recent Labs  Lab 07/25/24 1534 07/25/24 1943 07/25/24 2343 07/26/24 0323 07/26/24 0753  GLUCAP 114* 141* 125* 103* 152*    Iron Studies: No results for input(s): IRON, TIBC, TRANSFERRIN, FERRITIN in the last 72 hours. Studies/Results:   Medications: Infusions:  amiodarone  30 mg/hr (07/26/24 0900)   bivalirudin  (ANGIOMAX ) 250 mg in sodium chloride  0.9 % 500 mL (0.5 mg/mL) infusion 0.026 mg/kg/hr (07/26/24 0900)   ketamine  (KETALAR ) adult infusion 0.5 mg/kg/hr (07/26/24 0900)   magnesium  sulfate bolus IVPB     piperacillin -tazobactam (ZOSYN )  IV Stopped (07/26/24 0414)   potassium PHOSPHATE  IVPB (in mmol)     TPN ADULT (ION) 60 mL/hr at 07/26/24 0900   TPN ADULT (ION)      Scheduled Medications:  sodium chloride    Intravenous Once   arformoterol   15 mcg Nebulization BID   Chlorhexidine  Gluconate Cloth  6 each Topical Daily   collagenase    Topical Daily   fentaNYL  (SUBLIMAZE ) injection  25-50 mcg Intravenous Once   insulin  aspart  0-15 Units Subcutaneous Q4H   lidocaine -EPINEPHrine   20 mL Intradermal Once   midodrine   10 mg Per Tube TID WC   mouth rinse  15 mL Mouth Rinse Q2H   pantoprazole  (PROTONIX ) IV  40 mg Intravenous Q24H   polyethylene glycol  17 g Per Tube Daily   revefenacin   175 mcg Nebulization Daily   sodium chloride  flush  10 mL Intrapleural Q8H   sodium chloride  flush  10-40 mL Intracatheter Q12H    I have reviewed scheduled and prn medications.  Physical Exam: General: Chronically ill-appearing, lying in bed, no distress Heart: Normal rate, no rub Lungs: Coarse breath sound bilateral.  Bilateral chest rise Abdomen:soft, Non-tender, non-distended Extremities: 2Plus pitting edema in dependent areas  Katie Waller 07/26/2024,10:19 AM  LOS: 31 days

## 2024-07-27 DIAGNOSIS — Z7189 Other specified counseling: Secondary | ICD-10-CM

## 2024-07-27 DIAGNOSIS — Z515 Encounter for palliative care: Secondary | ICD-10-CM | POA: Diagnosis not present

## 2024-07-27 DIAGNOSIS — R6521 Severe sepsis with septic shock: Secondary | ICD-10-CM | POA: Diagnosis not present

## 2024-07-27 DIAGNOSIS — Z789 Other specified health status: Secondary | ICD-10-CM

## 2024-07-27 DIAGNOSIS — A419 Sepsis, unspecified organism: Secondary | ICD-10-CM | POA: Diagnosis not present

## 2024-07-27 DIAGNOSIS — K56609 Unspecified intestinal obstruction, unspecified as to partial versus complete obstruction: Secondary | ICD-10-CM

## 2024-07-27 DIAGNOSIS — Z992 Dependence on renal dialysis: Secondary | ICD-10-CM

## 2024-07-27 LAB — RENAL FUNCTION PANEL
Albumin: <1.5 g/dL — ABNORMAL LOW (ref 3.5–5.0)
Anion gap: 29 — ABNORMAL HIGH (ref 5–15)
BUN: 93 mg/dL — ABNORMAL HIGH (ref 8–23)
CO2: 18 mmol/L — ABNORMAL LOW (ref 22–32)
Calcium: 7.6 mg/dL — ABNORMAL LOW (ref 8.9–10.3)
Chloride: 85 mmol/L — ABNORMAL LOW (ref 98–111)
Creatinine, Ser: 2.83 mg/dL — ABNORMAL HIGH (ref 0.44–1.00)
GFR, Estimated: 17 mL/min — ABNORMAL LOW (ref >60)
Glucose, Bld: 278 mg/dL — ABNORMAL HIGH (ref 70–99)
Phosphorus: 4.2 mg/dL (ref 2.5–4.6)
Potassium: 3.4 mmol/L — ABNORMAL LOW (ref 3.5–5.1)
Sodium: 132 mmol/L — ABNORMAL LOW (ref 135–145)

## 2024-07-27 LAB — GLUCOSE, CAPILLARY
Glucose-Capillary: 137 mg/dL — ABNORMAL HIGH (ref 70–99)
Glucose-Capillary: 159 mg/dL — ABNORMAL HIGH (ref 70–99)
Glucose-Capillary: 140 mg/dL — ABNORMAL HIGH (ref 70–99)
Glucose-Capillary: 192 mg/dL — ABNORMAL HIGH (ref 70–99)
Glucose-Capillary: 144 mg/dL — ABNORMAL HIGH (ref 70–99)
Glucose-Capillary: 145 mg/dL — ABNORMAL HIGH (ref 70–99)

## 2024-07-27 LAB — CBC
HCT: 20.9 % — ABNORMAL LOW (ref 36.0–46.0)
Hemoglobin: 7 g/dL — ABNORMAL LOW (ref 12.0–15.0)
MCH: 29.5 pg (ref 26.0–34.0)
MCHC: 33.5 g/dL (ref 30.0–36.0)
MCV: 88.2 fL (ref 80.0–100.0)
Platelets: 245 K/uL (ref 150–400)
RBC: 2.37 MIL/uL — ABNORMAL LOW (ref 3.87–5.11)
RDW: 17.5 % — ABNORMAL HIGH (ref 11.5–15.5)
WBC: 20.3 K/uL — ABNORMAL HIGH (ref 4.0–10.5)
nRBC: 1.1 % — ABNORMAL HIGH (ref 0.0–0.2)

## 2024-07-27 LAB — APTT
aPTT: 80 s — ABNORMAL HIGH (ref 24–36)
aPTT: 69 s — ABNORMAL HIGH (ref 24–36)

## 2024-07-27 LAB — MAGNESIUM: Magnesium: 2.2 mg/dL (ref 1.7–2.4)

## 2024-07-27 MED ORDER — HEPARIN SODIUM (PORCINE) 1000 UNIT/ML IJ SOLN
INTRAMUSCULAR | Status: AC
  Filled 2024-07-27: qty 4

## 2024-07-27 MED ORDER — ALBUMIN HUMAN 25 % IV SOLN
25.0000 g | Freq: Once | INTRAVENOUS | Status: AC
  Administered 2024-07-27: 25 g via INTRAVENOUS

## 2024-07-27 MED ORDER — DARBEPOETIN ALFA 60 MCG/0.3ML IJ SOSY: 60.0000 ug | PREFILLED_SYRINGE | INTRAMUSCULAR | Status: DC

## 2024-07-27 MED ORDER — ALBUMIN HUMAN 25 % IV SOLN
INTRAVENOUS | Status: AC
  Filled 2024-07-27: qty 200

## 2024-07-27 MED ORDER — POTASSIUM CHLORIDE 10 MEQ/100ML IV SOLN
10.0000 meq | INTRAVENOUS | Status: AC
Start: 2024-07-27 — End: 2024-07-27
  Administered 2024-07-27 (×2): 10 meq via INTRAVENOUS
  Filled 2024-07-27 (×4): qty 100

## 2024-07-27 MED ORDER — TRACE MINERALS CU-MN-SE-ZN 300-55-60-3000 MCG/ML IV SOLN
INTRAVENOUS | Status: AC
  Filled 2024-07-27: qty 768

## 2024-07-27 NOTE — Progress Notes (Signed)
 PHARMACY - ANTICOAGULATION CONSULT NOTE  Pharmacy Consult:  Bivalirudin   Indication: atrial fibrillation and internal jugular thombus   Allergies  Allergen Reactions   Ace Inhibitors Swelling   Diphenhydramine Other (See Comments)    Hyperactive/jittery    Doxycycline Hyclate Nausea And Vomiting   Codeine Nausea And Vomiting    Patient Measurements: Height: 5' 1 (154.9 cm) Weight: 73.2 kg (161 lb 6 oz) IBW/kg (Calculated) : 47.8 HEPARIN  DW (KG): 63.1  Vital Signs: Temp: 98.7 F (37.1 C) (11/22 1516) Temp Source: Axillary (11/22 1516) BP: 163/66 (11/22 1830) Pulse Rate: 85 (11/22 1830)  Labs: Recent Labs    07/25/24 0421 07/25/24 1848 07/26/24 0409 07/26/24 0722 07/26/24 1632 07/27/24 0424 07/27/24 1812  HGB 8.8*  --  7.3*  --   --  7.0*  --   HCT 26.3*  --  21.8*  --   --  20.9*  --   PLT 165  --  175  --   --  245  --   APTT 84*   < > 101* 72* 82* 80* 69*  CREATININE 2.84*  --  2.03* 1.98*  --  2.83*  --    < > = values in this interval not displayed.    Estimated Creatinine Clearance: 15.7 mL/min (A) (by C-G formula based on SCr of 2.83 mg/dL (H)).  Assessment: [name] with recurrent Afib to resume bivalirudin .  She was previously on IV heparin  and was switched to bivalirudin  due to labile heparin  levels.  Med was stopped because it was thought that Afib was secondary to critical illness, which resolved. However patient went back into Afib w/ RVR on 11/7 and bivalirudin  has been resumed.  aPTT 69, therapeutic  No issues with infusion, running appropriately at bivalrudin 0.026 mg/kg/hr. Hgb 7.0, plts stable. No bleeding reported.   Goal of Therapy:  aPTT 50-85 seconds Monitor platelets by anticoagulation protocol: Yes   Plan:  Continue bivalrudin at 0.026 mg/kg/hr (Wt: 66 kg) aPTT BID Daily CBC  Thank you for allowing pharmacy to participate in this patient's care.  Rocky Slade, PharmD, BCPS 07/27/2024,7:05 PM  Please check AMION for all [facility]  Pharmacy phone numbers After 10:00 PM, call Main Pharmacy [phone]

## 2024-07-27 NOTE — Progress Notes (Signed)
 PHARMACY - ANTICOAGULATION CONSULT NOTE  Pharmacy Consult:  Bivalirudin   Indication: atrial fibrillation and internal jugular thombus   Allergies  Allergen Reactions   Ace Inhibitors Swelling   Diphenhydramine Other (See Comments)    Hyperactive/jittery    Doxycycline Hyclate Nausea And Vomiting   Codeine Nausea And Vomiting    Patient Measurements: Height: 5' 1 (154.9 cm) Weight: 74.2 kg (163 lb 9.3 oz) IBW/kg (Calculated) : 47.8 HEPARIN  DW (KG): 63.1  Vital Signs: Temp: 98.2 F (36.8 C) (11/22 0728) Temp Source: Axillary (11/22 0728) BP: 143/58 (11/22 0700) Pulse Rate: 84 (11/22 0700)  Labs: Recent Labs    07/25/24 0421 07/25/24 1848 07/26/24 0409 07/26/24 0722 07/26/24 1632 07/27/24 0424  HGB 8.8*  --  7.3*  --   --  7.0*  HCT 26.3*  --  21.8*  --   --  20.9*  PLT 165  --  175  --   --  245  APTT 84*   < > 101* 72* 82* 80*  CREATININE 2.84*  --  2.03* 1.98*  --  2.83*   < > = values in this interval not displayed.    Estimated Creatinine Clearance: 15.8 mL/min (A) (by C-G formula based on SCr of 2.83 mg/dL (H)).  Assessment: 58 YOF with recurrent Afib to resume bivalirudin .  She was previously on IV heparin  and was switched to bivalirudin  due to labile heparin  levels.  Med was stopped because it was thought that Afib was secondary to critical illness, which resolved. However patient went back into Afib w/ RVR on 11/7 and bivalirudin  has been resumed.  aPTT 80, therapeutic  No issues with infusion, running appropriately at bivalrudin 0.026 mg/kg/hr. Hgb 7.0, plts stable. No bleeding noted by RN.   Goal of Therapy:  aPTT 50-85 seconds Monitor platelets by anticoagulation protocol: Yes   Plan:  Continue bivalrudin at 0.026 mg/kg/hr (Wt: 66 kg) aPTT BID Daily CBC  Thank you for allowing pharmacy to participate in this patient's care.  Leonor GORMAN Bash, PharmD Emergency Medicine Clinical Pharmacist 07/27/2024,7:51 AM

## 2024-07-27 NOTE — Progress Notes (Signed)
 The patient completed HD treatment and goal met. UF off 4 L. Albumin  25 g  IV given during HD.  07/27/24 1327  Vitals  Temp 98.3 F (36.8 C)  Temp Source Axillary  BP (!) 140/61  BP Location Left Arm  BP Method Automatic  Patient Position (if appropriate) Lying  During Treatment Monitoring  Intra-Hemodialysis Comments Tx completed  Post Treatment  Dialyzer Clearance Lightly streaked (clotted once during HD.)  Hemodialysis Intake (mL) 0 mL  Liters Processed 64  Fluid Removed (mL) 4000 mL  Tolerated HD Treatment Yes  Post-Hemodialysis Comments see notes.  Hemodialysis Catheter Left Internal jugular  Placement Date/Time: 07/16/24 1520   Verification by X-ray: Yes  Person Inserting LDA: [place] PA-C  Orientation: Left  Access Location: Internal jugular  Site Condition No complications  Blue Lumen Status Heparin  locked  Red Lumen Status Heparin  locked  Catheter fill solution Heparin  1000 units/ml  Catheter fill volume (Arterial) 1.4 cc  Catheter fill volume (Venous) 1.4  Dressing Type Transparent  Dressing Status Antimicrobial disc/dressing in place  Interventions Dressing reinforced  Drainage Description None  Dressing Change Due 07/30/24  Post treatment catheter status Capped and Clamped

## 2024-07-27 NOTE — Progress Notes (Signed)
 Katie Waller, MRN:  [id number], DOB:  01-Mar-1946, LOS: 32 ADMISSION DATE:  06/25/2024, CONSULTATION DATE:  06/25/2024 CHIEF COMPLAINT:  Respiratory failure, AMS  Summary  78 y.o. 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 Her course has been complicated by sepsis from pulmonary source, chronic respiratory failure requiring tracheostomy after several failed extubations, and persistent ventilator-dependence. It has also been complicated by AKI and iHD dependence, severe protein calorie malnutrition in setting of severe/refractory ileus requiring TPN, and identification of critical limb ischemia (bilateral lower extremities) for which management options are currently limited to palliative AKA in setting of multiorgan failure and severe malnutrition / impaired wound healing.  Significant Hospital Events: Including procedures, antibiotic start and stop dates in addition to other pertinent events   10/21 Found unresponsive at home->ED>Code stroke>intubated for ARD/airway protection>CTH/CTA negative>Code stroke cancelled>Hypotensive>started on pressors>Admit to ICU  Arterial line placed 10/21 10/22 and 10/23 unsuccessful extubation 10/24: Despite maximal Precedex  and fentanyl  pushes, unable to safely and comfortably clean the patient up so added prn Versed  and started fentanyl  infusion. Rectal tube added for diarrhea.  10/24: 2/3 BC resulted in staph capitis/epidermitis, extubated on BiPAP 10/25: patient remained on BiPAP for 24 hours, when she came off of BiPAP, could not tolerate became tachypneic, tachycardic went into respiratory distress requiring endotracheal reintubation 10/29: unsuccessful SBT, went briefly back into RVR 10/30:  unsuccessful weaning from sedation as BP/HR increased considerably 11/2: extubated to HFNC 11/8: progressively became more hypoxic with decreased respiratory drive prompting ABG this a.m. which revealed toxic hypercapnic respiratory failure.  Transferred back to ICU for impending intubation; tried draining pleural spaces but still tired out 11/10: trach 11/10: aggressive diuresis with Lasix  80 followed by 160 followed by 260 + metolazone  and bicarb - no response>Nephrology consulted 11/11: nephro consult. LE weakness noted on exam with inability to move - neuro consulted, MRI C and T spine ordered. CT pulled yesterday, worsening ptx>2 R CT placed, ptx improved. CRRT started. Hypotensive>briefly on Levo 11/12: Levo stopped. MRI C spine>cervical stenosis.  11/13 Off TF.  11/14: Worsening hypotension in setting of tension pneumothorax. Resolved after chest tube was flushed with improved pressor requirement. Levophed  now 5 mcg/min 11/15 back on TF 11/16 started zosyn  for leukocytosis 11/19 identification of critical limb ischemia. 11/20: tolerated iHD without vasopressor infusion. This was on midodrine  10 mg Q8H. Vascular advised limited management options at this time (palliative AKA as tissue loss progresses). 11/21: called [facility] transfer [facility] to request transfer to their facility at family's request for a second opinion. MD-to-MD conversation was conducted. Their intensivist then spoke to their vascular surgeon who advised that on their review the recommendations provided by our vascular surgery team were appropriate and transfer would be declined due to no higher level of care being required / services being available at current facility. This was conveyed to the family in real-time and there were no clarifying questions from family who were appreciative of the secondary confirmation this provided.  Interim History / Subjective:  Pain remains adequately controlled on fentanyl  IV pushes (just 2x  IVP in last 18 hrs) rather than continuous infusion. Remains on ketamine  infusion as well.  Objective    Blood pressure (!) 155/56, pulse 87, temperature 97.6 F (36.4 C), temperature source Axillary,  resp. rate 16, height 5' 1 (1.549 m), weight 77.2 kg, SpO2 100%. CVP:  [12 mmHg] 12 mmHg  Vent Waller: PRVC FiO2 (%):  [40 %] 40 % Set Rate:  [25 bmp] 25 bmp Vt Set:  [380 mL] 380 mL PEEP:  [5 cmH20] 5 cmH20 Pressure Support:  [8 cmH20] 8 cmH20 Plateau Pressure:  [16 cmH20-19 cmH20] 19 cmH20   Intake/Output Summary (Last 24 hours) at 07/27/2024 1033 Last data filed at 07/27/2024 0600 Gross per 24 hour  Intake 2260.11 ml  Output 820 ml  Net 1440.11 ml   Filed Weights   07/26/24 0404 07/27/24 0500 07/27/24 0934  Weight: 75.5 kg 74.2 kg 77.2 kg    Examination: General: Chronically ill appearing woman, anasarca present. Lungs: mechanically ventilated; tracheostomy in place. R chest tube in place. Cardiovascular: Regular rate and rhythm with occasional ectopy. Abdomen: Soft, non-tender. I do appreciate bowel sounds today albeit hypoactive. Extremities: Mottled, cool, ischemic-appearing. Not significantly changed from prior day. Neuro: Opens eyes to gentle tactile stimulus. Falls back to sleep within several seconds.  Assessment and Plan   NEURO: Ketamine  infusion + fentanyl  IVP for pain. Severe cervical stenosis with ? Contribution to LE weakness. She has previously been deemed to not be a neurosurgical candidate in her current condition.  COR: Bilateral critical limb ischemia. Limited management options (palliative AKA) in her present condition as per Vascular Surgery team (see their note for details). Bivalirudin  for limb ischemia and atrial fibrillation and RIJ CVL-associated thrombus. Amiodarone  for atrial fibrillation.  PULM: Chronic respiratory failure s/p tracheostomy. Currently dependent on mechanical ventilation. Weaning limited by severe protein calorie  malnutrition.  RENAL: AKI requiring iHD. Tolerated iHD without vasopressor infusions (using midodrine  10 mg Q8H) on 11/20.  GI: Persistent/refractory ileus. Did not respond to either neostigmine  x4 or methylnaltrexone  x3. Also failed lower evidence based strategies. We are minimizing opioid exposure (see above), and targeting more euvolemic state (iHD). Improving mobility would also help but this is not a viable strategy for this patient.  HEME: Angiomax  as above.  DVT PPX: Angiomax  GI PPX: Protonix  Code Status: Full (last readdressed on 11/20 in context of newly identified critical limb ischemia with limited management options).  Labs   CBC: Recent Labs  Lab 07/23/24 0534 07/24/24 0456 07/24/24 0523 07/24/24 1309 07/25/24 0421 07/26/24 0409 07/27/24 0424  WBC 18.7*  --  16.1*  --  19.6* 17.5* 20.3*  HGB 7.1*   < > 6.0* 8.3* 8.8* 7.3* 7.0*  HCT 20.6*   < > 18.0* 24.6* 26.3* 21.8* 20.9*  MCV 89.6  --  93.3  --  89.8 89.7 88.2  PLT 151  --  137*  --  165 175 245   < > = values in this interval not displayed.    Basic Metabolic Panel: Recent Labs  Lab 07/24/24 0523 07/25/24 0421 07/26/24 0409 07/26/24 0722 07/27/24 0424  NA 133* 130* 132* 133* 132*  K 3.1* 3.9 3.1* 3.3* 3.4*  CL 91* 92* 86* 94* 85*  CO2 26 22 21* 23 18*  GLUCOSE 221* 149* 266* 158* 278*  BUN 53* 93* 56* 66* 93*  CREATININE 1.97* 2.84* 2.03* 1.98* 2.83*  CALCIUM  7.3* 8.3* 7.2* 8.1* 7.6*  MG 1.6* 1.9 1.7 1.7 2.2  PHOS 3.2 3.4 2.0* 1.9* 4.2   GFR: Estimated Creatinine Clearance: 16.2 mL/min (A) (by C-G formula based on SCr of 2.83 mg/dL (H)). Recent Labs  Lab 07/24/24 0523 07/25/24 0421 07/26/24 0409 07/27/24 0424  WBC 16.1* 19.6* 17.5* 20.3*  Liver Function Tests: Recent Labs  Lab 07/23/24 0534 07/24/24 0523 07/25/24 0421 07/26/24 0409 07/27/24 0424  AST  --   --  136*  --   --   ALT  --   --  337*  --   --   ALKPHOS  --   --  146*  --   --   BILITOT  --   --  0.7  --   --   PROT   --   --  5.3*  --   --   ALBUMIN  <1.5* <1.5* <1.5* 1.6* <1.5*   No results for input(s): LIPASE, AMYLASE in the last 168 hours. No results for input(s): AMMONIA in the last 168 hours.  ABG    Component Value Date/Time   PHART 7.370 07/24/2024 0456   PCO2ART 53.3 (H) 07/24/2024 0456   PO2ART 107 07/24/2024 0456   HCO3 30.9 (H) 07/24/2024 0456   TCO2 32 07/24/2024 0456   ACIDBASEDEF 2.0 07/19/2024 2016   O2SAT 98 07/24/2024 0456     Coagulation Profile: No results for input(s): INR, PROTIME in the last 168 hours.  Cardiac Enzymes: No results for input(s): CKTOTAL, CKMB, CKMBINDEX, TROPONINI in the last 168 hours.  HbA1C: Hgb A1c MFr Bld  Date/Time Value Ref Range Status  06/26/2024 04:47 AM 5.6 4.8 - 5.6 % Final    Comment:    (NOTE) Diagnosis of Diabetes The following HbA1c ranges recommended by the American Diabetes Association (ADA) may be used as an aid in the diagnosis of diabetes mellitus.  Hemoglobin             Suggested A1C NGSP%              Diagnosis  <5.7                   Non Diabetic  5.7-6.4                Pre-Diabetic  >6.4                   Diabetic  <7.0                   Glycemic control for                       adults with diabetes.    09/17/2013 01:57 PM 6.1 (H) <5.7 % Final    Comment:    (NOTE)                                                                       According to the ADA Clinical Practice Recommendations for 2011, when HbA1c is used as a screening test:  >=6.5%   Diagnostic of Diabetes Mellitus           (if abnormal result is confirmed) 5.7-6.4%   Increased risk of developing Diabetes Mellitus References:Diagnosis and Classification of Diabetes Mellitus,Diabetes Care,2011,34(Suppl 1):S62-S69 and Standards of Medical Care in         Diabetes - 2011,Diabetes Care,2011,34 (Suppl 1):S11-S61.    CBG: Recent Labs  Lab 07/26/24 1548 07/26/24 2006 07/26/24 2324 07/27/24 0324 07/27/24 0729  GLUCAP 163*  142* 140* 137* 159*    Current  Medications  Scheduled Meds:  sodium chloride    Intravenous Once   arformoterol   15 mcg Nebulization BID   Chlorhexidine  Gluconate Cloth  6 each Topical Daily   collagenase    Topical Daily   [START ON 08/03/2024] darbepoetin (ARANESP ) injection - DIALYSIS  60 mcg Subcutaneous Q Sat-1800   fentaNYL  (SUBLIMAZE ) injection  25-50 mcg Intravenous Once   insulin  aspart  0-15 Units Subcutaneous Q4H   lidocaine -EPINEPHrine   20 mL Intradermal Once   midodrine   10 mg Per Tube TID WC   mouth rinse  15 mL Mouth Rinse Q2H   pantoprazole  (PROTONIX ) IV  40 mg Intravenous Q24H   polyethylene glycol  17 g Per Tube Daily   revefenacin   175 mcg Nebulization Daily   sodium chloride  flush  10 mL Intrapleural Q8H   sodium chloride  flush  10-40 mL Intracatheter Q12H   Continuous Infusions:  amiodarone  30 mg/hr (07/27/24 0600)   bivalirudin  (ANGIOMAX ) 250 mg in sodium chloride  0.9 % 500 mL (0.5 mg/mL) infusion 0.026 mg/kg/hr (07/27/24 0600)   ketamine  (KETALAR ) adult infusion 0.5 mg/kg/hr (07/27/24 0600)   piperacillin -tazobactam (ZOSYN )  IV Stopped (07/27/24 0432)   potassium chloride  10 mEq (07/27/24 1022)   TPN ADULT (ION) 60 mL/hr at 07/27/24 0600   TPN ADULT (ION)     PRN Meds:.alteplase , fentaNYL , heparin , hydrALAZINE , ipratropium-albuterol , lidocaine  (PF), lidocaine -prilocaine , ondansetron  (ZOFRAN ) IV, mouth rinse, pentafluoroprop-tetrafluoroeth, polyethylene glycol, simethicone , sodium chloride  flush  Critical care time: 40 minutes (excludes time spent performing any procedures)

## 2024-07-27 NOTE — Progress Notes (Signed)
 [facility] NEPHROLOGY PROGRESS NOTE  Assessment/ Plan: Pt is a 78 y.o. 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   # Severe anuric AKI: Baseline creatinine normal.  Likely ATN in the setting of sepsis.  Complicated by fluid overload.  CRRT on 11/11-16.  No signs of renal recovery at this time  -HD today then MWF next week.  Will use additional sessions of dialysis as able based on nursing availability - Continue midodrine  10 mg 3 times daily  -Long-term candidacy for dialysis questionable - Continue to monitor for renal recovery -Discussed we may have to return to CRRT if we fall behind on fluid. We discussed this is not an ideal solution and unlikely to change overall trajectory. Family remains focused on aggressive measures.    # Acute on chronic hypoxic respiratory failure, metapneumovirus pneumonia: Initially treated with broad-spectrum antibiotics which was discontinued later.  Patient required tracheostomy on 11/9 and managing by pulmonary team.   # Anemia: Continue with transfusions intermittently.  Will start esa  # Hypoalbuminemia: Will provide IV albumin  intermittently with dialysis as needed to help with hypotension  # Shock: Multifactorial.  Currently not requiring pressors.  Continue midodrine   # Hyponatremia: Associate with fluid accumulation.  Management with dialysis  Discussed with the patient's daughter, ICU team  Subjective: Daughter states no changes overnight.  Planning for dialysis today  Objective Vital signs in last 24 hours: Vitals:   07/27/24 0530 07/27/24 0600 07/27/24 0630 07/27/24 0700  BP: (!) 148/56 (!) 145/54 (!) 131/54  (!) 143/58  Pulse: 83 84 85 84  Resp: (!) 26 19 (!) 25 (!) 22  Temp:      TempSrc:      SpO2: 100% 100% 100% 100%  Weight:      Height:       Weight change: 0.1 kg  Intake/Output Summary (Last 24 hours) at 07/27/2024 0711 Last data filed at 07/27/2024 0600 Gross per 24 hour  Intake 2646.15 ml  Output 820 ml  Net 1826.15 ml       Labs: RENAL PANEL Recent Labs  Lab 07/23/24 0534 07/24/24 0456 07/24/24 0523 07/25/24 0421 07/26/24 0409 07/26/24 0722 07/27/24 0424  NA 133*   < > 133* 130* 132* 133* 132*  K 5.1   < > 3.1* 3.9 3.1* 3.3* 3.4*  CL 86*  --  91* 92* 86* 94* 85*  CO2 28  --  26 22 21* 23 18*  GLUCOSE 125*  --  221* 149* 266* 158* 278*  BUN 73*  --  53* 93* 56* 66* 93*  CREATININE 2.47*  --  1.97* 2.84* 2.03* 1.98* 2.83*  CALCIUM  7.8*  --  7.3* 8.3* 7.2* 8.1* 7.6*  MG 2.1  --  1.6* 1.9 1.7 1.7 2.2  PHOS 6.5*  --  3.2 3.4 2.0* 1.9* 4.2  ALBUMIN  <1.5*  --  <1.5* <1.5* 1.6*  --  <1.5*   < > = values in this interval not displayed.    Liver Function Tests: Recent Labs  Lab 07/25/24 0421 07/26/24 0409 07/27/24 0424  AST 136*  --   --   ALT 337*  --   --   ALKPHOS 146*  --   --  BILITOT 0.7  --   --   PROT 5.3*  --   --   ALBUMIN  <1.5* 1.6* <1.5*   No results for input(s): LIPASE, AMYLASE in the last 168 hours.  No results for input(s): AMMONIA in the last 168 hours. CBC: Recent Labs    07/11/24 0228 07/12/24 0841 07/24/24 0523 07/24/24 1309 07/25/24 0421 07/26/24 0409 07/27/24 0424  HGB 8.9*   < > 6.0* 8.3* 8.8* 7.3* 7.0*  MCV 100.0   < > 93.3  --  89.8 89.7 88.2  VITAMINB12 1,029*  --   --   --   --   --   --   FOLATE >20.0  --   --   --   --   --   --   FERRITIN 762*  --   --   --   --   --   --   TIBC 227*  --   --   --   --   --   --   IRON 44  --   --   --   --   --   --   RETICCTPCT 1.7  --   --   --   --   --   --    < > = values in this interval not displayed.    Cardiac Enzymes: No results for input(s): CKTOTAL,  CKMB, CKMBINDEX, TROPONINI in the last 168 hours. CBG: Recent Labs  Lab 07/26/24 1116 07/26/24 1548 07/26/24 2006 07/26/24 2324 07/27/24 0324  GLUCAP 128* 163* 142* 140* 137*    Iron Studies: No results for input(s): IRON, TIBC, TRANSFERRIN, FERRITIN in the last 72 hours. Studies/Results:   Medications: Infusions:  amiodarone  30 mg/hr (07/27/24 0600)   bivalirudin  (ANGIOMAX ) 250 mg in sodium chloride  0.9 % 500 mL (0.5 mg/mL) infusion 0.026 mg/kg/hr (07/27/24 0600)   ketamine  (KETALAR ) adult infusion 0.5 mg/kg/hr (07/27/24 0600)   piperacillin -tazobactam (ZOSYN )  IV Stopped (07/27/24 0432)   TPN ADULT (ION) 60 mL/hr at 07/27/24 0600    Scheduled Medications:  sodium chloride    Intravenous Once   arformoterol   15 mcg Nebulization BID   Chlorhexidine  Gluconate Cloth  6 each Topical Daily   collagenase    Topical Daily   fentaNYL  (SUBLIMAZE ) injection  25-50 mcg Intravenous Once   insulin  aspart  0-15 Units Subcutaneous Q4H   lidocaine -EPINEPHrine   20 mL Intradermal Once   midodrine   10 mg Per Tube TID WC   mouth rinse  15 mL Mouth Rinse Q2H   pantoprazole  (PROTONIX ) IV  40 mg Intravenous Q24H   polyethylene glycol  17 g Per Tube Daily   revefenacin   175 mcg Nebulization Daily   sodium chloride  flush  10 mL Intrapleural Q8H   sodium chloride  flush  10-40 mL Intracatheter Q12H    I have reviewed scheduled and prn medications.  Physical Exam: General: Chronically ill-appearing, lying in bed, no distress Heart: Normal rate, no rub Lungs: Coarse breath sound bilateral.  Bilateral chest rise Abdomen:soft, Non-tender, non-distended Extremities: 2Plus pitting edema in arms and legs  Jefferson Kendon Sedeno 07/27/2024,7:11 AM  LOS: 32 days

## 2024-07-27 NOTE — Progress Notes (Signed)
 PHARMACY - TOTAL PARENTERAL NUTRITION CONSULT NOTE  Indication: Ileus, Intolerance to EN  Patient Measurements: Height: 5' 1 (154.9 cm) Weight: 74.2 kg (163 lb 9.3 oz) IBW/kg (Calculated) : 47.8 TPN AdjBW (KG): 53.6 Body mass index is 30.91 kg/m.  Assessment:  23 YOF presented on 10/21 with PNA/COPD exacerbation, hospitalization complicated by ARF requiring CRRT, PTX, cervical stenosis/paralysis and Afib RVR.  Patient initially tolerated TF and was having diarrhea requiring fiber.  Ileus developed around 11/12 and TF/fiber were held, started on Reglan , Relistor  and neostigmine .  Trickle TF resumed on 11/15 and then held 11/17 given significant NG output and aspiration.  Pharmacy consulted to dose TPN given prolonged ileus.  Glucose / Insulin : hx preDM, A1c 5.6% - CBGs 128-163. Used 15 units SSI in the past 24 hrs Electrolytes: low Na/CL, K 3.4 (goal >/= 4), CoCa ~9.6, Mag 2.2, Phos up 4.2 (goal >/= 2), CO2 low 18.  Renal: CRRT 11/11 >> 11/16, HD 11/21, again 11/22 then MWF - BUN up 93 Hepatic: LFTs elevated on 11/20 (not d/t TPN), lipase 99, tbili WNL, albumin  < 1.5 Intake / Output; MIVF: NG , stool 0mL GI meds: PRN erythromycin  and Relistor  GI Imaging: none since TPN initiation GI Surgeries / Procedures: none since TPN initiation  Central access: PICC placed 07/12/24 TPN start date: 07/22/24  Nutritional Goals: Goal concentrated TPN rate is 60 mL/hr to provide 1738 kCal and 115g AA per day  RD Estimated Needs Total Energy Estimated Needs: 1700-1900 Total Protein Estimated Needs: 110-125 grams Total Fluid Estimated Needs: >/=1.5L  Current Nutrition:  TPN  Plan:  Continue concentrated TPN at goal rate 60 mL/hr at 1800 to provide 100% of needs Electrolytes in TPN: Na 150 mEq/L, increase K to 56mEq/L (= 25 mEq/day), Ca 47mEq/L, Mg 48mEq/L, Phos 80mmol/L (= 7 mmol/day), Cl:Ac 2:1  Add standard MVI and trace elements to TPN (D/C BComplex) Replacing KCl 40mEq with Nephrology  approval Continue moderate SSI Q4H  Standard TPN labs Mon/Thurs.  Renal function panel BID and daily Mag per Renal  Harlene Boga, PharmD, BCPS, BCCCP Please refer to [facility] for [facility] Pharmacy numbers 07/27/2024, 7:18 AM

## 2024-07-27 NOTE — Consult Note (Signed)
 Palliative Care Consult Note                                  Date: 07/27/2024   Patient Name: Katie Waller  DOB: 1946-01-29  MRN: [id number]  Age / Sex: 78 y.o., female  PCP: Sun, Vyvyan, MD Referring Physician: Olena Lamar PARAS, MD  Reason for Consultation: Establishing goals of care  HPI/Patient Profile: 78 y.o. 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   Her course has been complicated by sepsis from pulmonary source, chronic respiratory failure requiring tracheostomy after several failed extubations, and persistent ventilator-dependence. It has also been complicated by AKI and iHD dependence, severe protein calorie malnutrition in setting of severe/refractory ileus requiring TPN, and identification of critical limb ischemia (bilateral lower extremities) for which management options are currently limited to palliative AKA in setting of multiorgan failure and severe malnutrition / impaired wound healing.   PMT consulted to discuss goals of care.   Past Medical History:  Diagnosis Date   Abnormal finding on EKG 09/19/2013   Acute on chronic respiratory failure with hypercapnia (HCC)    Acute respiratory failure (HCC) 06/08/2016   Anxiety 11/09/2020   Arthritis    Asthma    Carotid bruit 11/09/2020   Chronic diastolic CHF (congestive heart failure) (HCC) 07/28/2017   Chronic obstructive pulmonary disease, unspecified (HCC) 11/09/2020   COPD, group D, by GOLD 2017 classification (HCC) 09/17/2013   Cough 11/09/2020   Decreased estrogen level 11/09/2020   Diabetes (HCC)    Dyspnea    Edema 11/09/2020   Essential hypertension 09/18/2013   Goals of care, counseling/discussion     Hardening of the aorta (main artery of the heart) 11/09/2020   Heart disease    History of radiation therapy    Left Lung- 06/20/23-06/26/23- Dr. 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   Transient ischemic attack 11/09/2020   Venous stasis of both lower extremities 06/13/2016    Subjective:   I have reviewed medical records including EPIC notes, labs and imaging, received update from attending provider, assessed the patient and then met with the patient's daughter to discuss diagnosis prognosis, GOC, EOL wishes, disposition and options.  I introduced Palliative Medicine as specialized medical care for people living with serious illness. It focuses on providing relief from symptoms and stress of a serious illness. The goal is to improve quality of life for both the patient and the family.  Today's Discussion: Patient's daughter shared her understanding of the patient's chronic conditions and current hospitalization. We discussed her respiratory failure/ vent dependence, malnutrition, wound, AKI, and critical limb ischemia. I shared my understanding, including limitations of ongoing interventions and high risk for further decline despite aggressive treatment efforts.  Patient has two children, four grandchildren,  and a dog.  She has been divorced from her ex-husband for several years.  The patient and her daughter Maida lived together in a house on the same street as her son Lucina Forensic Scientist). The patient is retired.  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  Since retirement she has enjoyed spending time with her grandchildren and watching television.  Her religious faith is very important to her. Before this admission patient was using a rollator and was completing most ADLs independently.  She no longer drove but did manage her own finances.  She had a good appetite.  Patient has HCPOA document in [place]. This lists her daughter [name] as proxy decision maker with her son Lucina Forensic Scientist) as counsellor. We discussed advanced directives. The patient did not have a living will but had told her daughter that she would want aggressive measures to maintain her life. I encouraged the patient's daughter to consider if the patient would place any limitations to what interventions she might find acceptable depending on her quality of life and overall suffering.  We discussed code status. Recommended consideration of DNR status, understanding evidenced-based poor outcomes in similar hospitalized patients, as the cause of the arrest is likely associated with chronic/terminal disease rather than a reversible acute cardio-pulmonary event. Patient's daughter confirms patient is a full code at this time.  She shared this is her brother's wish at this time and she is allowing him time to process. Offered to discuss code status with him but she declined at this time.  We discussed the differences between an aggressive medical intervention and a comfort focussed path for this patient. We discussed options for end of life care. At this time they would like to continue full scope of care but daughter is grateful for information on options. Daughter seems to understand the patient's poor prognosis- she states the patient needs a miracle but is hopeful for a miracle.   Discussed the importance of continued conversation with family and the medical providers regarding overall plan of care  and treatment options, ensuring decisions are within the context of the patient's values and GOCs.  Emotional support and therapeutic listening provided. Questions and concerns were addressed. Hard Choices booklet left for review. The family was encouraged to call with questions or concerns. PMT will continue to support holistically.  Review of Systems  Unable to perform ROS   Objective:   Primary Diagnoses: Present on Admission:  Sepsis (HCC)  Acute on chronic respiratory failure with hypoxia [facility])   Physical Exam Vitals reviewed.  Constitutional:      General: She is sleeping. She is not in acute distress.    Appearance: She is ill-appearing.     Interventions: She is intubated.     Comments: tracheostomy  Cardiovascular:     Rate and Rhythm: Normal rate.  Pulmonary:     Effort: She is intubated.     Vital Signs:  BP (!) 156/65 (BP Location: Left Arm)   Pulse 92   Temp 97.8 F (36.6 C) (Axillary)   Resp (!) 23   Ht 5' 1 (1.549 m)   Wt 77.2 kg   SpO2 100%   BMI 32.16 kg/m   Palliative Assessment/Data: 30%    Advanced Care Planning:   Existing Vynca/ACP Documentation: HCPOA document  Primary Decision Maker: HCPOA- Daughter Maida Lunger  Code Status/Advance Care Planning: Full code   Assessment & Plan:   SUMMARY OF RECOMMENDATIONS  Full code Full scope of care Encouraged daughter to continue considering goals of care moving forward PMT to support  Discussed with: attending provider  Time Total: 120 minutes    Thank you for allowing us  to participate in the care of Tawsha L Wilbon PMT will continue to support holistically.   Signed by: Stephane Palin, NP Palliative Medicine Team  Team Phone # [phone] (Nights/Weekends)  07/27/2024, 1:09 PM

## 2024-07-27 NOTE — Plan of Care (Signed)
 Problem: Health Behavior/Discharge Planning: Goal: Ability to manage health-related needs will improve Outcome: Progressing   Problem: Clinical Measurements: Goal: Ability to maintain clinical measurements within normal limits will improve Outcome: Progressing Goal: Will remain free from infection Outcome: Progressing Goal: Diagnostic test results will improve Outcome: Progressing Goal: Respiratory complications will improve Outcome: Progressing Goal: Cardiovascular complication will be avoided Outcome: Progressing   Problem: Activity: Goal: Risk for activity intolerance will decrease Outcome: Progressing   Problem: Nutrition: Goal: Adequate nutrition will be maintained Outcome: Progressing   Problem: Coping: Goal: Level of anxiety will decrease Outcome: Progressing   Problem: Elimination: Goal: Will not experience complications related to bowel motility Outcome: Progressing Goal: Will not experience complications related to urinary retention Outcome: Progressing   Problem: Pain Managment: Goal: General experience of comfort will improve and/or be controlled Outcome: Progressing   Problem: Safety: Goal: Ability to remain free from injury will improve Outcome: Progressing   Problem: Skin Integrity: Goal: Risk for impaired skin integrity will decrease Outcome: Progressing   Problem: Education: Goal: Ability to describe self-care measures that may prevent or decrease complications (Diabetes Survival Skills Education) will improve Outcome: Progressing Goal: Individualized Educational Video(s) Outcome: Progressing   Problem: Coping: Goal: Ability to adjust to condition or change in health will improve Outcome: Progressing   Problem: Fluid Volume: Goal: Ability to maintain a balanced intake and output will improve Outcome: Progressing   Problem: Health Behavior/Discharge Planning: Goal: Ability to identify and utilize available resources and services will  improve Outcome: Progressing Goal: Ability to manage health-related needs will improve Outcome: Progressing   Problem: Metabolic: Goal: Ability to maintain appropriate glucose levels will improve Outcome: Progressing   Problem: Nutritional: Goal: Maintenance of adequate nutrition will improve Outcome: Progressing Goal: Progress toward achieving an optimal weight will improve Outcome: Progressing   Problem: Skin Integrity: Goal: Risk for impaired skin integrity will decrease Outcome: Progressing   Problem: Tissue Perfusion: Goal: Adequacy of tissue perfusion will improve Outcome: Progressing   Problem: Fluid Volume: Goal: Hemodynamic stability will improve Outcome: Progressing   Problem: Clinical Measurements: Goal: Diagnostic test results will improve Outcome: Progressing Goal: Signs and symptoms of infection will decrease Outcome: Progressing   Problem: Respiratory: Goal: Ability to maintain adequate ventilation will improve Outcome: Progressing   Problem: Education: Goal: Knowledge of disease or condition will improve Outcome: Progressing Goal: Knowledge of the prescribed therapeutic regimen will improve Outcome: Progressing Goal: Individualized Educational Video(s) Outcome: Progressing   Problem: Activity: Goal: Ability to tolerate increased activity will improve Outcome: Progressing Goal: Will verbalize the importance of balancing activity with adequate rest periods Outcome: Progressing   Problem: Respiratory: Goal: Ability to maintain a clear airway will improve Outcome: Progressing Goal: Levels of oxygenation will improve Outcome: Progressing Goal: Ability to maintain adequate ventilation will improve Outcome: Progressing   Problem: Education: Goal: Ability to demonstrate management of disease process will improve Outcome: Progressing Goal: Ability to verbalize understanding of medication therapies will improve Outcome: Progressing Goal:  Individualized Educational Video(s) Outcome: Progressing   Problem: Education: Goal: Knowledge of disease or condition will improve Outcome: Progressing Goal: Knowledge of the prescribed therapeutic regimen will improve Outcome: Progressing Goal: Individualized Educational Video(s) Outcome: Progressing   Problem: Activity: Goal: Ability to tolerate increased activity will improve Outcome: Progressing Goal: Will verbalize the importance of balancing activity with adequate rest periods Outcome: Progressing   Problem: Respiratory: Goal: Ability to maintain a clear airway will improve Outcome: Progressing Goal: Levels of oxygenation will improve Outcome: Progressing Goal: Ability to maintain  adequate ventilation will improve Outcome: Progressing

## 2024-07-28 LAB — GLUCOSE, CAPILLARY
Glucose-Capillary: 182 mg/dL — ABNORMAL HIGH (ref 70–99)
Glucose-Capillary: 166 mg/dL — ABNORMAL HIGH (ref 70–99)
Glucose-Capillary: 197 mg/dL — ABNORMAL HIGH (ref 70–99)
Glucose-Capillary: 108 mg/dL — ABNORMAL HIGH (ref 70–99)
Glucose-Capillary: 155 mg/dL — ABNORMAL HIGH (ref 70–99)
Glucose-Capillary: 127 mg/dL — ABNORMAL HIGH (ref 70–99)

## 2024-07-28 LAB — RENAL FUNCTION PANEL
Albumin: 1.8 g/dL — ABNORMAL LOW (ref 3.5–5.0)
Anion gap: 17 — ABNORMAL HIGH (ref 5–15)
BUN: 81 mg/dL — ABNORMAL HIGH (ref 8–23)
CO2: 22 mmol/L (ref 22–32)
Calcium: 8.5 mg/dL — ABNORMAL LOW (ref 8.9–10.3)
Chloride: 98 mmol/L (ref 98–111)
Creatinine, Ser: 2.04 mg/dL — ABNORMAL HIGH (ref 0.44–1.00)
GFR, Estimated: 25 mL/min — ABNORMAL LOW (ref >60)
Glucose, Bld: 189 mg/dL — ABNORMAL HIGH (ref 70–99)
Phosphorus: 4.3 mg/dL (ref 2.5–4.6)
Potassium: 4.1 mmol/L (ref 3.5–5.1)
Sodium: 137 mmol/L (ref 135–145)

## 2024-07-28 LAB — CBC
HCT: 21.4 % — ABNORMAL LOW (ref 36.0–46.0)
Hemoglobin: 7.3 g/dL — ABNORMAL LOW (ref 12.0–15.0)
MCH: 29.8 pg (ref 26.0–34.0)
MCHC: 34.1 g/dL (ref 30.0–36.0)
MCV: 87.3 fL (ref 80.0–100.0)
Platelets: 352 K/uL (ref 150–400)
RBC: 2.45 MIL/uL — ABNORMAL LOW (ref 3.87–5.11)
RDW: 17.9 % — ABNORMAL HIGH (ref 11.5–15.5)
WBC: 21.5 K/uL — ABNORMAL HIGH (ref 4.0–10.5)
nRBC: 1.7 % — ABNORMAL HIGH (ref 0.0–0.2)

## 2024-07-28 LAB — APTT
aPTT: 76 s — ABNORMAL HIGH (ref 24–36)
aPTT: 38 s — ABNORMAL HIGH (ref 24–36)
aPTT: 69 s — ABNORMAL HIGH (ref 24–36)

## 2024-07-28 LAB — MAGNESIUM: Magnesium: 2.1 mg/dL (ref 1.7–2.4)

## 2024-07-28 MED ORDER — HEPARIN SODIUM (PORCINE) 1000 UNIT/ML IJ SOLN
INTRAMUSCULAR | Status: AC
  Filled 2024-07-28: qty 4

## 2024-07-28 MED ORDER — FENTANYL CITRATE (PF) 50 MCG/ML IJ SOSY
25.0000 ug | PREFILLED_SYRINGE | INTRAMUSCULAR | Status: DC | PRN
  Administered 2024-07-29 (×3): 25 ug via INTRAVENOUS
  Filled 2024-07-28 (×3): qty 1

## 2024-07-28 MED ORDER — TRACE MINERALS CU-MN-SE-ZN 300-55-60-3000 MCG/ML IV SOLN
INTRAVENOUS | Status: AC
  Filled 2024-07-28: qty 768

## 2024-07-28 MED ORDER — ALBUMIN HUMAN 25 % IV SOLN
INTRAVENOUS | Status: AC
  Filled 2024-07-28: qty 100

## 2024-07-28 MED ORDER — ALBUMIN HUMAN 25 % IV SOLN
25.0000 g | Freq: Once | INTRAVENOUS | Status: AC
  Administered 2024-07-28: 25 g via INTRAVENOUS

## 2024-07-28 NOTE — Progress Notes (Signed)
 PHARMACY - TOTAL PARENTERAL NUTRITION CONSULT NOTE  Indication: Ileus, Intolerance to EN  Patient Measurements: Height: 5' 1 (154.9 cm) Weight: 71.1 kg (156 lb 12 oz) IBW/kg (Calculated) : 47.8 TPN AdjBW (KG): 53.6 Body mass index is 29.62 kg/m.  Assessment:  28 YOF presented on 10/21 with PNA/COPD exacerbation, hospitalization complicated by ARF requiring CRRT, PTX, cervical stenosis/paralysis and Afib RVR.  Patient initially tolerated TF and was having diarrhea requiring fiber.  Ileus developed around 11/12 and TF/fiber were held, started on Reglan , Relistor  and neostigmine .  Trickle TF resumed on 11/15 and then held 11/17 given significant NG output and aspiration.  Pharmacy consulted to dose TPN given prolonged ileus.  Glucose / Insulin : hx preDM, A1c 5.6% - CBGs 140-192. Used 15 units SSI in the past 24 hrs Electrolytes: K 4.1 (goal >/= 4), CoCa ~10.3, Mag 2.1 (goal >/=2), Phos 4.3  Renal: CRRT 11/11 >> 11/16, HD 11/21, again 11/22 then MWF - BUN down 81 Hepatic: LFTs elevated on 11/20 (not d/t TPN), lipase 99, tbili WNL, albumin  < 1.5 Intake / Output; MIVF: NG , stool 0mL GI meds: PRN erythromycin  and Relistor  GI Imaging: none since TPN initiation GI Surgeries / Procedures: none since TPN initiation  Central access: PICC placed 07/12/24 TPN start date: 07/22/24  Nutritional Goals: Goal concentrated TPN rate is 60 mL/hr to provide 1738 kCal and 115g AA per day  RD Estimated Needs Total Energy Estimated Needs: 1700-1900 Total Protein Estimated Needs: 110-125 grams Total Fluid Estimated Needs: >/=1.5L  Current Nutrition:  TPN  Plan:  Continue concentrated TPN at goal rate 60 mL/hr at 1800 to provide 100% of needs Electrolytes in TPN: Na 150 mEq/L, K 10mEq/L (= 25 mEq/day), decr Ca 2.5mEq/L, Mg 47mEq/L, Phos 3mmol/L (= 7 mmol/day), Cl:Ac 2:1  Add standard MVI and trace elements to TPN (D/C BComplex) Continue moderate SSI Q4H  Standard TPN labs Mon/Thurs.  Renal  function panel BID and daily Mag per Renal  Harlene Boga, PharmD, BCPS, BCCCP Please refer to [facility] for [facility] Pharmacy numbers 07/28/2024, 7:08 AM

## 2024-07-28 NOTE — Progress Notes (Signed)
 Katie Waller, MRN:  [id number], DOB:  09-03-1946, LOS: 33 ADMISSION DATE:  06/25/2024, CONSULTATION DATE:  06/25/2024 CHIEF COMPLAINT:  Respiratory failure, AMS  Summary  78 y.o. 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 Her course has been complicated by sepsis from pulmonary source, chronic respiratory failure requiring tracheostomy after several failed extubations, and persistent ventilator-dependence. It has also been complicated by AKI and iHD dependence, severe protein calorie malnutrition in setting of severe/refractory ileus requiring TPN, and identification of critical limb ischemia (bilateral lower extremities) for which management options are currently limited to palliative AKA in setting of multiorgan failure and severe malnutrition / impaired wound healing.  Significant Hospital Events: Including procedures, antibiotic start and stop dates in addition to other pertinent events   10/21 Found unresponsive at home->ED>Code stroke>intubated for ARD/airway protection>CTH/CTA negative>Code stroke cancelled>Hypotensive>started on pressors>Admit to ICU  Arterial line placed 10/21 10/22 and 10/23 unsuccessful extubation 10/24: Despite maximal Precedex  and fentanyl  pushes, unable to safely and comfortably clean the patient up so added prn Versed  and started fentanyl  infusion. Rectal tube added for diarrhea.  10/24: 2/3 BC resulted in staph capitis/epidermitis, extubated on BiPAP 10/25: patient remained on BiPAP for 24 hours, when she came off of BiPAP, could not tolerate became tachypneic, tachycardic went into respiratory distress requiring endotracheal reintubation 10/29: unsuccessful SBT, went briefly back into RVR 10/30:  unsuccessful weaning from sedation as BP/HR increased considerably 11/2: extubated to HFNC 11/8: progressively became more hypoxic with decreased respiratory drive prompting ABG this a.m. which revealed toxic hypercapnic respiratory failure.  Transferred back to ICU for impending intubation; tried draining pleural spaces but still tired out 11/10: trach 11/10: aggressive diuresis with Lasix  80 followed by 160 followed by 260 + metolazone  and bicarb - no response>Nephrology consulted 11/11: nephro consult. LE weakness noted on exam with inability to move - neuro consulted, MRI C and T spine ordered. CT pulled yesterday, worsening ptx>2 R CT placed, ptx improved. CRRT started. Hypotensive>briefly on Levo 11/12: Levo stopped. MRI C spine>cervical stenosis.  11/13 Off TF.  11/14: Worsening hypotension in setting of tension pneumothorax. Resolved after chest tube was flushed with improved pressor requirement. Levophed  now 5 mcg/min 11/15 back on TF 11/16 started zosyn  for leukocytosis 11/19 identification of critical limb ischemia. 11/20: tolerated iHD without vasopressor infusion. This was on midodrine  10 mg Q8H. Vascular advised limited management options at this time (palliative AKA as tissue loss progresses). 11/21: called [facility] to request transfer to their facility at family's request for a second opinion. MD-to-MD conversation was conducted. Their intensivist then spoke to their vascular surgeon who advised that on their review the recommendations provided by our vascular surgery team were appropriate and transfer would be declined due to no higher level of care being required / services being available at current facility. This was conveyed to the family in real-time and there were no clarifying questions from family who were appreciative of the secondary confirmation this provided. 11/22: Did well off fentanyl  without any boluses throughout the day.  Interim History / Subjective:   Patient is very somnolent today. Only opens eyes in response to movement of her legs (I.e. a noxious stimulus) and even then only briefly. She has not received fentanyl  in 24-36 hours. Remains  on ketamine  infusion at unchanged rate. No other changes y/day.  Objective    Blood pressure (!) 138/57, pulse 94, temperature 99.1 F (37.3 C), temperature source Oral, resp. rate (!) 23, height 5' 1 (1.549 m), weight 71.1 kg, SpO2 100%. CVP:  [17 mmHg] 17 mmHg  Vent Mode: PRVC FiO2 (%):  [40 %] 40 % Set Rate:  [25 bmp] 25 bmp Vt Set:  [380 mL] 380 mL PEEP:  [5 cmH20] 5 cmH20 Plateau Pressure:  [16 cmH20-21 cmH20] 21 cmH20   Intake/Output Summary (Last 24 hours) at 07/28/2024 1242 Last data filed at 07/28/2024 1000 Gross per 24 hour  Intake 2020.24 ml  Output 4808 ml  Net -2787.76 ml   Filed Weights   07/27/24 0934 07/27/24 1311 07/28/24 0500  Weight: 77.2 kg 73.2 kg 71.1 kg    Examination: General: Chronically ill appearing woman, anasarca present. Lungs: mechanically ventilated; tracheostomy in place. R chest tube in place. Cardiovascular: Regular rate and rhythm with occasional ectopy. Abdomen: Soft, non-tender. I do appreciate bowel sounds albeit hypoactive. Extremities: Mottled, cool, ischemic-appearing. Not significantly changed from prior day. Neuro: Previously would open eyes to gentle tactile stimulus, but as of 11/23 she opens eyes to noxious stimuli only and for < 5-10 seconds.  Assessment and Plan   NEURO: Pause ketamine  infusion given somnolence. Severe cervical stenosis with ? Contribution to LE weakness. She has previously been deemed to not be a neurosurgical candidate in her current condition.  COR: Bilateral critical limb ischemia. Limited management options (palliative AKA) in her present condition as per Vascular Surgery team (see their note for details). Bivalirudin  for limb ischemia and atrial fibrillation and RIJ CVL-associated thrombus. Amiodarone  for atrial  fibrillation.  PULM: Chronic respiratory failure s/p tracheostomy. Currently dependent on mechanical ventilation. Weaning limited by severe protein calorie malnutrition.  RENAL: AKI requiring iHD. Tolerated iHD without vasopressor infusions (using midodrine  10 mg Q8H) on 11/20.  GI: Persistent/refractory ileus. Did not respond to either neostigmine  x4 or methylnaltrexone  x3. Also failed lower evidence based strategies. We are minimizing opioid exposure (see above), and targeting more euvolemic state (iHD). Improving mobility would also help but this is not a viable strategy for this patient.  HEME: Angiomax  as above.  DVT PPX: Angiomax  GI PPX: Protonix  Code Status: Full (last readdressed on 11/20 in context of newly identified critical limb ischemia with limited management options).  Labs   CBC: Recent Labs  Lab 07/24/24 0523 07/24/24 1309 07/25/24 0421 07/26/24 0409 07/27/24 0424 07/28/24 0500  WBC 16.1*  --  19.6* 17.5* 20.3* 21.5*  HGB 6.0* 8.3* 8.8* 7.3* 7.0* 7.3*  HCT 18.0* 24.6* 26.3* 21.8* 20.9* 21.4*  MCV 93.3  --  89.8 89.7 88.2 87.3  PLT 137*  --  165 175 245 352    Basic Metabolic Panel: Recent Labs  Lab 07/25/24 0421 07/26/24 0409 07/26/24 0722 07/27/24 0424 07/28/24 0500  NA 130* 132* 133* 132* 137  K 3.9 3.1* 3.3* 3.4* 4.1  CL 92* 86* 94* 85* 98  CO2 22 21* 23 18* 22  GLUCOSE 149* 266* 158* 278* 189*  BUN 93* 56* 66* 93* 81*  CREATININE 2.84* 2.03* 1.98* 2.83* 2.04*  CALCIUM  8.3* 7.2* 8.1* 7.6* 8.5*  MG 1.9 1.7 1.7 2.2 2.1  PHOS 3.4 2.0* 1.9* 4.2 4.3   GFR: Estimated Creatinine Clearance: 21.5 mL/min (A) (by C-G formula based on SCr of 2.04 mg/dL (H)). Recent Labs  Lab 07/25/24 0421 07/26/24 0409 07/27/24 0424 07/28/24 0500  WBC 19.6* 17.5* 20.3*  21.5*    Liver Function Tests: Recent Labs  Lab 07/24/24 0523 07/25/24 0421 07/26/24 0409 07/27/24 0424 07/28/24 0500  AST  --  136*  --   --   --   ALT  --  337*  --   --   --   ALKPHOS   --  146*  --   --   --   BILITOT  --  0.7  --   --   --   PROT  --  5.3*  --   --   --   ALBUMIN  <1.5* <1.5* 1.6* <1.5* 1.8*   No results for input(s): LIPASE, AMYLASE in the last 168 hours. No results for input(s): AMMONIA in the last 168 hours.  ABG    Component Value Date/Time   PHART 7.370 07/24/2024 0456   PCO2ART 53.3 (H) 07/24/2024 0456   PO2ART 107 07/24/2024 0456   HCO3 30.9 (H) 07/24/2024 0456   TCO2 32 07/24/2024 0456   ACIDBASEDEF 2.0 07/19/2024 2016   O2SAT 98 07/24/2024 0456     Coagulation Profile: No results for input(s): INR, PROTIME in the last 168 hours.  Cardiac Enzymes: No results for input(s): CKTOTAL, CKMB, CKMBINDEX, TROPONINI in the last 168 hours.  HbA1C: Hgb A1c MFr Bld  Date/Time Value Ref Range Status  06/26/2024 04:47 AM 5.6 4.8 - 5.6 % Final    Comment:    (NOTE) Diagnosis of Diabetes The following HbA1c ranges recommended by the American Diabetes Association (ADA) may be used as an aid in the diagnosis of diabetes mellitus.  Hemoglobin             Suggested A1C NGSP%              Diagnosis  <5.7                   Non Diabetic  5.7-6.4                Pre-Diabetic  >6.4                   Diabetic  <7.0                   Glycemic control for                       adults with diabetes.    09/17/2013 01:57 PM 6.1 (H) <5.7 % Final    Comment:    (NOTE)                                                                       According to the ADA Clinical Practice Recommendations for 2011, when HbA1c is used as a screening test:  >=6.5%   Diagnostic of Diabetes Mellitus           (if abnormal result is confirmed) 5.7-6.4%   Increased risk of developing Diabetes Mellitus References:Diagnosis and Classification of Diabetes Mellitus,Diabetes Care,2011,34(Suppl 1):S62-S69 and Standards of Medical Care in         Diabetes - 2011,Diabetes Care,2011,34 (Suppl 1):S11-S61.    CBG: Recent Labs  Lab 07/27/24 1942  07/27/24 2317 07/28/24 0336 07/28/24 0753 07/28/24 1137  GLUCAP 144* 145* 182* 166* 197*  Current Medications  Scheduled Meds:  sodium chloride    Intravenous Once   arformoterol   15 mcg Nebulization BID   Chlorhexidine  Gluconate Cloth  6 each Topical Daily   collagenase    Topical Daily   [START ON 08/03/2024] darbepoetin (ARANESP ) injection - DIALYSIS  60 mcg Subcutaneous Q Sat-1800   fentaNYL  (SUBLIMAZE ) injection  25-50 mcg Intravenous Once   insulin  aspart  0-15 Units Subcutaneous Q4H   lidocaine -EPINEPHrine   20 mL Intradermal Once   midodrine   10 mg Per Tube TID WC   mouth rinse  15 mL Mouth Rinse Q2H   pantoprazole  (PROTONIX ) IV  40 mg Intravenous Q24H   polyethylene glycol  17 g Per Tube Daily   revefenacin   175 mcg Nebulization Daily   sodium chloride  flush  10 mL Intrapleural Q8H   sodium chloride  flush  10-40 mL Intracatheter Q12H   Continuous Infusions:  amiodarone  30 mg/hr (07/28/24 1000)   bivalirudin  (ANGIOMAX ) 250 mg in sodium chloride  0.9 % 500 mL (0.5 mg/mL) infusion 0.026 mg/kg/hr (07/28/24 1000)   ketamine  (KETALAR ) adult infusion Stopped (07/28/24 0938)   TPN ADULT (ION) 60 mL/hr at 07/28/24 1000   TPN ADULT (ION)     PRN Meds:.alteplase , fentaNYL , heparin , hydrALAZINE , ipratropium-albuterol , lidocaine  (PF), lidocaine -prilocaine , ondansetron  (ZOFRAN ) IV, mouth rinse, pentafluoroprop-tetrafluoroeth, polyethylene glycol, simethicone , sodium chloride  flush  Critical care time: 38 minutes (excludes time spent performing any procedures)

## 2024-07-28 NOTE — Progress Notes (Signed)
   07/28/24 1805  Vitals  Pulse Rate 96  Resp (!) 27  SpO2 98 %  O2 Device Ventilator  Weight 67.3 kg  Type of Weight Post-Dialysis  Oxygen  Therapy  FiO2 (%) 40 %  Patient Activity (if Appropriate) In bed  Pulse Oximetry Type Continuous  Oximetry Probe Site Changed No  Post Treatment  Dialyzer Clearance Lightly streaked  Hemodialysis Intake (mL) 0 mL  Liters Processed 63  Fluid Removed (mL) 3800 mL  Tolerated HD Treatment Yes   Pt tx completed at bedside--50m09 Responds to voice--intubated and sedated Informed consent signed and in chart.   TX duration:3.5  Patient tolerated well.  Hand-off given to patient's nurse.   Access used: LIJ trialysis Access issues: no complications  Total UF removed: 3800 Medication(s) given: total of 50gms of Albumin  iv   [facility]

## 2024-07-28 NOTE — Progress Notes (Signed)
 PHARMACY - ANTICOAGULATION CONSULT NOTE  Pharmacy Consult:  Bivalirudin   Indication: atrial fibrillation and internal jugular thombus   Allergies  Allergen Reactions   Ace Inhibitors Swelling   Diphenhydramine Other (See Comments)    Hyperactive/jittery    Doxycycline Hyclate Nausea And Vomiting   Codeine Nausea And Vomiting    Patient Measurements: Height: 5' 1 (154.9 cm) Weight: 71.1 kg (156 lb 12 oz) IBW/kg (Calculated) : 47.8 HEPARIN  DW (KG): 63.1  Vital Signs: Temp: 99.8 F (37.7 C) (11/23 0400) Temp Source: Axillary (11/23 0400) BP: 116/50 (11/23 0600) Pulse Rate: 91 (11/23 0615)  Labs: Recent Labs    07/26/24 0409 07/26/24 0722 07/26/24 1632 07/27/24 0424 07/27/24 1812 07/28/24 0500  HGB 7.3*  --   --  7.0*  --  7.3*  HCT 21.8*  --   --  20.9*  --  21.4*  PLT 175  --   --  245  --  352  APTT 101* 72*   < > 80* 69* 76*  CREATININE 2.03* 1.98*  --  2.83*  --  2.04*   < > = values in this interval not displayed.    Estimated Creatinine Clearance: 21.5 mL/min (A) (by C-G formula based on SCr of 2.04 mg/dL (H)).  Assessment: 55 YOF with recurrent Afib to resume bivalirudin .  She was previously on IV heparin  and was switched to bivalirudin  due to labile heparin  levels.  Med was stopped because it was thought that Afib was secondary to critical illness, which resolved. However patient went back into Afib w/ RVR on 11/7 and bivalirudin  has been resumed.  aPTT 76, therapeutic  No issues with bleeding or infusion noted by RN. Noted hgb 7.3, stable from prior.  Goal of Therapy:  aPTT 50-85 seconds Monitor platelets by anticoagulation protocol: Yes   Plan:  Continue bivalrudin at 0.026 mg/kg/hr (Wt: 66 kg) aPTT BID Daily CBC  Thank you for allowing pharmacy to participate in this patient's care.  Leonor GORMAN Bash, PharmD Emergency Medicine Clinical Pharmacist 07/28/2024,7:15 AM

## 2024-07-28 NOTE — Progress Notes (Signed)
 [facility] NEPHROLOGY PROGRESS NOTE  Assessment/ Plan: Pt is a 78 y.o. 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   # Severe anuric AKI: Baseline creatinine normal.  Likely ATN in the setting of sepsis.  Complicated by fluid overload.  CRRT on 11/11-16.  No signs of renal recovery at this time  -Tolerated 3 sessions of HD this week. Difficult time keeping up with fluids gains although tolerating aggressive UF. Plan for HD on MWF this week. Consider extra sessions when nursing availability allows - Continue midodrine  10 mg 3 times daily  -Long-term candidacy for dialysis very poor - Continue to monitor for renal recovery -Discussed we may have to return to CRRT if we fall behind on fluid. We discussed this is not an ideal solution and unlikely to change overall trajectory. Family remains focused on aggressive measures.    # Acute on chronic hypoxic respiratory failure, metapneumovirus pneumonia: Initially treated with broad-spectrum antibiotics which was discontinued later.  Patient required tracheostomy on 11/9 and managing by pulmonary team.   # Anemia: Continue with transfusions intermittently.  ESA has been started  # Hypoalbuminemia: Will provide IV albumin  intermittently with dialysis as needed to help with hypotension  # Shock: Multifactorial.  Currently not requiring pressors.  Continue midodrine   # Hyponatremia: Associate with fluid accumulation.  Management with dialysis  Discussed with the patient's daughter, ICU team  Subjective: Daughter states patient slept through HD yesterday. 4L removed.  Objective Vital signs in last 24 hours: Vitals:    07/28/24 0700 07/28/24 0746 07/28/24 0800 07/28/24 0814  BP: (!) 120/53  (!) 155/61   Pulse: 91 96 95   Resp: (!) 23 (!) 25 (!) 21   Temp:    99.4 F (37.4 C)  TempSrc:    Oral  SpO2: 100% 100% 100%   Weight:      Height:       Weight change: 3 kg  Intake/Output Summary (Last 24 hours) at 07/28/2024 0848 Last data filed at 07/28/2024 0800 Gross per 24 hour  Intake 2189.24 ml  Output 4708 ml  Net -2518.76 ml       Labs: RENAL PANEL Recent Labs  Lab 07/24/24 0523 07/25/24 0421 07/26/24 0409 07/26/24 0722 07/27/24 0424 07/28/24 0500  NA 133* 130* 132* 133* 132* 137  K 3.1* 3.9 3.1* 3.3* 3.4* 4.1  CL 91* 92* 86* 94* 85* 98  CO2 26 22 21* 23 18* 22  GLUCOSE 221* 149* 266* 158* 278* 189*  BUN 53* 93* 56* 66* 93* 81*  CREATININE 1.97* 2.84* 2.03* 1.98* 2.83* 2.04*  CALCIUM  7.3* 8.3* 7.2* 8.1* 7.6* 8.5*  MG 1.6* 1.9 1.7 1.7 2.2 2.1  PHOS 3.2 3.4 2.0* 1.9* 4.2 4.3  ALBUMIN  <1.5* <1.5* 1.6*  --  <1.5* 1.8*    Liver Function Tests: Recent Labs  Lab 07/25/24 0421 07/26/24 0409 07/27/24 0424 07/28/24 0500  AST 136*  --   --   --   ALT 337*  --   --   --   ALKPHOS 146*  --   --   --   BILITOT 0.7  --   --   --   PROT 5.3*  --   --   --  ALBUMIN  <1.5* 1.6* <1.5* 1.8*   No results for input(s): LIPASE, AMYLASE in the last 168 hours.  No results for input(s): AMMONIA in the last 168 hours. CBC: Recent Labs    07/11/24 0228 07/12/24 0841 07/24/24 1309 07/25/24 0421 07/26/24 0409 07/27/24 0424 07/28/24 0500  HGB 8.9*   < > 8.3* 8.8* 7.3* 7.0* 7.3*  MCV 100.0   < >  --  89.8 89.7 88.2 87.3  VITAMINB12 1,029*  --   --   --   --   --   --   FOLATE >20.0  --   --   --   --   --   --   FERRITIN 762*  --   --   --   --   --   --   TIBC 227*  --   --   --   --   --   --   IRON 44  --   --   --   --   --   --   RETICCTPCT 1.7  --   --   --   --   --   --    < > = values in this interval not displayed.    Cardiac Enzymes: No results for input(s):  CKTOTAL, CKMB, CKMBINDEX, TROPONINI in the last 168 hours. CBG: Recent Labs  Lab 07/27/24 1519 07/27/24 1942 07/27/24 2317 07/28/24 0336 07/28/24 0753  GLUCAP 192* 144* 145* 182* 166*    Iron Studies: No results for input(s): IRON, TIBC, TRANSFERRIN, FERRITIN in the last 72 hours. Studies/Results:   Medications: Infusions:  amiodarone  30 mg/hr (07/28/24 0800)   bivalirudin  (ANGIOMAX ) 250 mg in sodium chloride  0.9 % 500 mL (0.5 mg/mL) infusion 0.026 mg/kg/hr (07/28/24 0800)   ketamine  (KETALAR ) adult infusion 0.5 mg/kg/hr (07/28/24 0800)   piperacillin -tazobactam (ZOSYN )  IV Stopped (07/28/24 0434)   TPN ADULT (ION) 60 mL/hr at 07/28/24 0800   TPN ADULT (ION)      Scheduled Medications:  sodium chloride    Intravenous Once   arformoterol   15 mcg Nebulization BID   Chlorhexidine  Gluconate Cloth  6 each Topical Daily   collagenase    Topical Daily   [START ON 08/03/2024] darbepoetin (ARANESP ) injection - DIALYSIS  60 mcg Subcutaneous Q Sat-1800   fentaNYL  (SUBLIMAZE ) injection  25-50 mcg Intravenous Once   insulin  aspart  0-15 Units Subcutaneous Q4H   lidocaine -EPINEPHrine   20 mL Intradermal Once   midodrine   10 mg Per Tube TID WC   mouth rinse  15 mL Mouth Rinse Q2H   pantoprazole  (PROTONIX ) IV  40 mg Intravenous Q24H   polyethylene glycol  17 g Per Tube Daily   revefenacin   175 mcg Nebulization Daily   sodium chloride  flush  10 mL Intrapleural Q8H   sodium chloride  flush  10-40 mL Intracatheter Q12H    I have reviewed scheduled and prn medications.  Physical Exam: General: Chronically ill-appearing, lying in bed, no distress Heart: Normal rate, no rub Lungs: Coarse breath sound bilateral.  Bilateral chest rise Abdomen:soft, Non-tender, non-distended Extremities: 2+ pitting edema in arms and legs  Katie Waller 07/28/2024,8:48 AM  LOS: 33 days

## 2024-07-28 NOTE — Progress Notes (Signed)
 PHARMACY - ANTICOAGULATION CONSULT NOTE  Pharmacy Consult:  Bivalirudin   Indication: atrial fibrillation and internal jugular thombus   Allergies  Allergen Reactions   Ace Inhibitors Swelling   Diphenhydramine Other (See Comments)    Hyperactive/jittery    Doxycycline Hyclate Nausea And Vomiting   Codeine Nausea And Vomiting    Patient Measurements: Height: 5' 1 (154.9 cm) Weight: 67.3 kg (148 lb 5.9 oz) IBW/kg (Calculated) : 47.8 HEPARIN  DW (KG): 63.1  Vital Signs: Temp: 97.9 F (36.6 C) (11/23 2000) Temp Source: Axillary (11/23 2000) BP: 134/57 (11/23 2230) Pulse Rate: 88 (11/23 2230)  Labs: Recent Labs    07/26/24 0409 07/26/24 0722 07/26/24 1632 07/27/24 0424 07/27/24 1812 07/28/24 0500 07/28/24 1700 07/28/24 2215  HGB 7.3*  --   --  7.0*  --  7.3*  --   --   HCT 21.8*  --   --  20.9*  --  21.4*  --   --   PLT 175  --   --  245  --  352  --   --   APTT 101* 72*   < > 80*   < > 76* 38* 69*  CREATININE 2.03* 1.98*  --  2.83*  --  2.04*  --   --    < > = values in this interval not displayed.    Estimated Creatinine Clearance: 20.9 mL/min (A) (by C-G formula based on SCr of 2.04 mg/dL (H)).  Assessment: 17 YOF with recurrent Afib to resume bivalirudin .  She was previously on IV heparin  and was switched to bivalirudin  due to labile heparin  levels.  Med was stopped because it was thought that Afib was secondary to critical illness, which resolved. However patient went back into Afib w/ RVR on 11/7 and bivalirudin  has been resumed.  aPTT 69 and at goal on 0.028 mg/kg/hr  Goal of Therapy:  aPTT 50-85 seconds Monitor platelets by anticoagulation protocol: Yes   Plan:  Continue bivalrudin at 0.028 mg/kg/hr (Wt: 66 kg) aPTT  BID Daily CBC  Thank you for allowing pharmacy to participate in this patient's care.  Prentice Poisson, PharmD Clinical Pharmacist **Pharmacist phone directory can now be found on [email or web address] ([email or web address]).  Listed under [facility]  Pharmacy.

## 2024-07-28 NOTE — Progress Notes (Signed)
 PHARMACY - ANTICOAGULATION CONSULT NOTE  Pharmacy Consult:  Bivalirudin   Indication: atrial fibrillation and internal jugular thombus   Allergies  Allergen Reactions   Ace Inhibitors Swelling   Diphenhydramine Other (See Comments)    Hyperactive/jittery    Doxycycline Hyclate Nausea And Vomiting   Codeine Nausea And Vomiting    Patient Measurements: Height: 5' 1 (154.9 cm) Weight: 71.1 kg (156 lb 12 oz) IBW/kg (Calculated) : 47.8 HEPARIN  DW (KG): 63.1  Vital Signs: Temp: 98.2 F (36.8 C) (11/23 1558) Temp Source: Oral (11/23 1558) BP: 91/68 (11/23 1745) Pulse Rate: 98 (11/23 1745)  Labs: Recent Labs    07/26/24 0409 07/26/24 0722 07/26/24 1632 07/27/24 0424 07/27/24 1812 07/28/24 0500 07/28/24 1700  HGB 7.3*  --   --  7.0*  --  7.3*  --   HCT 21.8*  --   --  20.9*  --  21.4*  --   PLT 175  --   --  245  --  352  --   APTT 101* 72*   < > 80* 69* 76* 38*  CREATININE 2.03* 1.98*  --  2.83*  --  2.04*  --    < > = values in this interval not displayed.    Estimated Creatinine Clearance: 21.5 mL/min (A) (by C-G formula based on SCr of 2.04 mg/dL (H)).  Assessment: 34 YOF with recurrent Afib to resume bivalirudin .  She was previously on IV heparin  and was switched to bivalirudin  due to labile heparin  levels.  Med was stopped because it was thought that Afib was secondary to critical illness, which resolved. However patient went back into Afib w/ RVR on 11/7 and bivalirudin  has been resumed.  aPTT 38, SUBtherapeutic  No issues with bleeding or infusion noted by RN. Noted hgb 7.3, stable from prior.  Goal of Therapy:  aPTT 50-85 seconds Monitor platelets by anticoagulation protocol: Yes   Plan:  Increase bivalrudin at 0.028 mg/kg/hr (Wt: 66 kg) aPTT in 4hrs and BID Daily CBC  Thank you for allowing pharmacy to participate in this patient's care.  Rocky Slade, PharmD, BCPS 07/28/2024,5:52 PM  Please check AMION for all [facility] Pharmacy phone numbers After  10:00 PM, call Main Pharmacy [phone]

## 2024-07-29 ENCOUNTER — Inpatient Hospital Stay (HOSPITAL_COMMUNITY)

## 2024-07-29 DIAGNOSIS — Z7189 Other specified counseling: Secondary | ICD-10-CM | POA: Diagnosis not present

## 2024-07-29 DIAGNOSIS — K567 Ileus, unspecified: Secondary | ICD-10-CM | POA: Diagnosis not present

## 2024-07-29 DIAGNOSIS — Z515 Encounter for palliative care: Secondary | ICD-10-CM | POA: Diagnosis not present

## 2024-07-29 DIAGNOSIS — J9621 Acute and chronic respiratory failure with hypoxia: Secondary | ICD-10-CM | POA: Diagnosis not present

## 2024-07-29 DIAGNOSIS — N186 End stage renal disease: Secondary | ICD-10-CM

## 2024-07-29 DIAGNOSIS — J9622 Acute and chronic respiratory failure with hypercapnia: Secondary | ICD-10-CM | POA: Diagnosis not present

## 2024-07-29 DIAGNOSIS — I739 Peripheral vascular disease, unspecified: Secondary | ICD-10-CM

## 2024-07-29 DIAGNOSIS — R6521 Severe sepsis with septic shock: Secondary | ICD-10-CM | POA: Diagnosis not present

## 2024-07-29 DIAGNOSIS — J449 Chronic obstructive pulmonary disease, unspecified: Secondary | ICD-10-CM | POA: Diagnosis not present

## 2024-07-29 DIAGNOSIS — Z992 Dependence on renal dialysis: Secondary | ICD-10-CM

## 2024-07-29 DIAGNOSIS — A419 Sepsis, unspecified organism: Secondary | ICD-10-CM | POA: Diagnosis not present

## 2024-07-29 LAB — GLUCOSE, CAPILLARY
Glucose-Capillary: 140 mg/dL — ABNORMAL HIGH (ref 70–99)
Glucose-Capillary: 152 mg/dL — ABNORMAL HIGH (ref 70–99)
Glucose-Capillary: 157 mg/dL — ABNORMAL HIGH (ref 70–99)
Glucose-Capillary: 167 mg/dL — ABNORMAL HIGH (ref 70–99)
Glucose-Capillary: 168 mg/dL — ABNORMAL HIGH (ref 70–99)
Glucose-Capillary: 188 mg/dL — ABNORMAL HIGH (ref 70–99)

## 2024-07-29 LAB — COMPREHENSIVE METABOLIC PANEL WITH GFR
ALT: 94 U/L — ABNORMAL HIGH (ref 0–44)
AST: 46 U/L — ABNORMAL HIGH (ref 15–41)
Albumin: 2.2 g/dL — ABNORMAL LOW (ref 3.5–5.0)
Alkaline Phosphatase: 136 U/L — ABNORMAL HIGH (ref 38–126)
Anion gap: 17 — ABNORMAL HIGH (ref 5–15)
BUN: 63 mg/dL — ABNORMAL HIGH (ref 8–23)
CO2: 26 mmol/L (ref 22–32)
Calcium: 8.9 mg/dL (ref 8.9–10.3)
Chloride: 94 mmol/L — ABNORMAL LOW (ref 98–111)
Creatinine, Ser: 1.43 mg/dL — ABNORMAL HIGH (ref 0.44–1.00)
GFR, Estimated: 38 mL/min — ABNORMAL LOW (ref >60)
Glucose, Bld: 159 mg/dL — ABNORMAL HIGH (ref 70–99)
Potassium: 3.5 mmol/L (ref 3.5–5.1)
Sodium: 137 mmol/L (ref 135–145)
Total Bilirubin: 0.6 mg/dL (ref 0.0–1.2)
Total Protein: 6.2 g/dL — ABNORMAL LOW (ref 6.5–8.1)

## 2024-07-29 LAB — CBC
HCT: 20.8 % — ABNORMAL LOW (ref 36.0–46.0)
Hemoglobin: 7 g/dL — ABNORMAL LOW (ref 12.0–15.0)
MCH: 29.5 pg (ref 26.0–34.0)
MCHC: 33.7 g/dL (ref 30.0–36.0)
MCV: 87.8 fL (ref 80.0–100.0)
Platelets: 403 K/uL — ABNORMAL HIGH (ref 150–400)
RBC: 2.37 MIL/uL — ABNORMAL LOW (ref 3.87–5.11)
RDW: 17.7 % — ABNORMAL HIGH (ref 11.5–15.5)
WBC: 21.6 K/uL — ABNORMAL HIGH (ref 4.0–10.5)
nRBC: 1.9 % — ABNORMAL HIGH (ref 0.0–0.2)

## 2024-07-29 LAB — APTT
aPTT: 71 s — ABNORMAL HIGH (ref 24–36)
aPTT: 77 s — ABNORMAL HIGH (ref 24–36)

## 2024-07-29 LAB — PHOSPHORUS: Phosphorus: 3 mg/dL (ref 2.5–4.6)

## 2024-07-29 LAB — TRIGLYCERIDES: Triglycerides: 69 mg/dL (ref <150)

## 2024-07-29 LAB — MAGNESIUM: Magnesium: 2 mg/dL (ref 1.7–2.4)

## 2024-07-29 MED ORDER — POTASSIUM CHLORIDE 10 MEQ/50ML IV SOLN
10.0000 meq | INTRAVENOUS | Status: AC
Start: 2024-07-29 — End: 2024-07-29
  Administered 2024-07-29 (×2): 10 meq via INTRAVENOUS
  Filled 2024-07-29 (×2): qty 50

## 2024-07-29 MED ORDER — ATROPINE SULFATE 1 MG/10ML IJ SOSY
PREFILLED_SYRINGE | INTRAMUSCULAR | Status: AC
  Filled 2024-07-29: qty 10

## 2024-07-29 MED ORDER — SMOG ENEMA
960.0000 mL | Freq: Once | RECTAL | Status: AC
  Administered 2024-07-29: 960 mL via RECTAL
  Filled 2024-07-29: qty 960

## 2024-07-29 MED ORDER — FENTANYL CITRATE (PF) 50 MCG/ML IJ SOSY
25.0000 ug | PREFILLED_SYRINGE | INTRAMUSCULAR | Status: DC | PRN
  Administered 2024-07-29 – 2024-07-30 (×4): 50 ug via INTRAVENOUS
  Filled 2024-07-29 (×5): qty 1

## 2024-07-29 MED ORDER — MIDODRINE HCL 5 MG PO TABS
10.0000 mg | ORAL_TABLET | Freq: Three times a day (TID) | ORAL | Status: DC | PRN
  Administered 2024-07-30: 10 mg
  Filled 2024-07-29: qty 2

## 2024-07-29 MED ORDER — LACTULOSE ENEMA
300.0000 mL | Freq: Once | ORAL | Status: AC
  Administered 2024-07-29: 300 mL via RECTAL
  Filled 2024-07-29: qty 300

## 2024-07-29 MED ORDER — NEOSTIGMINE METHYLSULFATE 10 MG/10ML IV SOLN
0.5000 mg | Freq: Four times a day (QID) | INTRAVENOUS | Status: DC
  Administered 2024-07-29 – 2024-07-30 (×3): 0.5 mg via SUBCUTANEOUS
  Filled 2024-07-29 (×5): qty 0.5

## 2024-07-29 MED ORDER — TRACE MINERALS CU-MN-SE-ZN 300-55-60-3000 MCG/ML IV SOLN
INTRAVENOUS | Status: AC
  Filled 2024-07-29: qty 768

## 2024-07-29 NOTE — Progress Notes (Signed)
 PT Cancellation Note  Patient Details Name: Katie Waller MRN: [id number] DOB: 12-Oct-1945   Cancelled Treatment:    Reason Eval/Treat Not Completed: Patient's level of consciousness; vented and not interactive today per nursing.  Will follow up another day.   Montie Portal 07/29/2024, 3:47 PM Micheline Portal, PT Acute Rehabilitation Services [phone] 07/29/2024

## 2024-07-29 NOTE — Consult Note (Addendum)
 WOC Nurse wound follow up Wound type: Sacrum wound and bilateral buttock, bilateral thigh, DTI. Measurement: sacrum 18 cm x 15 cm, friable areas peri-anal area. Thighs aprox. 3 cm x 3.5 cm each. Wound bed: 90% black eschar, 10% red. Thighs 50% pale red, 50% yellow. Drainage (amount, consistency, odor) Minimum amount, serosanguinous. Periwound: purple intact skin. Dressing procedure/placement/frequency: Continue applying Santyl  to bilat buttocks and sacrum Q day, then cover with moist gauze and sacrum foam dressing.   Change foam dressing Q 3 days or PRN soiling.      WOC team will follow weekly. Please reconsult if further assistance is needed. Thank-you,  Lela Holm MSN, RN, CNS.  (Phone [phone])

## 2024-07-29 NOTE — Progress Notes (Signed)
 Patient ID: Katie Waller, female   DOB: [birth year], 78 y.o.   MRN: [id number] [facility] Progress Note   Assessment/ Plan:   1. Acute kidney Injury: Baseline creatinine noted to be normal and acute injury is suspected to be ATN in the setting of sepsis.  Started on renal replacement therapy for volume overload and currently remains on intermittent hemodialysis.  She remains anuric and has been typically on a MWF schedule (adjusted this week to accommodate for Thanksgiving holiday to Sunday/Tuesday and Friday).  She continues to have evidence of volume excess on physical exam but without compelling/acute indications for dialysis today.  On midodrine  for blood pressure support. 2.  Acute on chronic hypoxic respiratory failure: Secondary to metapneumovirus pneumonia and earlier treated with antibiotics.  Status post tracheostomy on 11/9 with ongoing management per CCM/pulmonary service. 3.  Anemia: Secondary to acute/critical illness.  Previously treated with transfusions as needed and now on ESA.  No overt blood loss noted. 4.  Recurrent atrial fibrillation/ IJ thrombus: On anticoagulation with bivalirudin   Subjective:   Underwent hemodialysis yesterday without problems.  No acute events reported from overnight   Objective:   BP (!) 144/57   Pulse 95   Temp 99 F (37.2 C) (Axillary)   Resp (!) 26   Ht 5' 1 (1.549 m)   Wt 71.1 kg   SpO2 100%   BMI 29.62 kg/m   Intake/Output Summary (Last 24 hours) at 07/29/2024 0810 Last data filed at 07/29/2024 0600 Gross per 24 hour  Intake 2045.15 ml  Output 5075 ml  Net -3029.85 ml   Weight change: -6.1 kg  Physical Exam: Gen: Resting comfortably in bed, does not awaken to conversation.  Daughter at bedside helps to answer questions. CVS: Pulse regular tachycardia, S1 and S2 normal Resp: Anteriorly clear to auscultation without distinct rales or rhonchi.  Status post tracheostomy Abd: Soft, flat, nontender, bowel sounds  normal Ext: Bilateral lower extremities in supportive booties.  1-2+ upper extremity pitting edema  Imaging: No results found.  Labs: BMET Recent Labs  Lab 07/24/24 0523 07/25/24 0421 07/26/24 0409 07/26/24 0722 07/27/24 0424 07/28/24 0500 07/29/24 0434  NA 133* 130* 132* 133* 132* 137 137  K 3.1* 3.9 3.1* 3.3* 3.4* 4.1 3.5  CL 91* 92* 86* 94* 85* 98 94*  CO2 26 22 21* 23 18* 22 26  GLUCOSE 221* 149* 266* 158* 278* 189* 159*  BUN 53* 93* 56* 66* 93* 81* 63*  CREATININE 1.97* 2.84* 2.03* 1.98* 2.83* 2.04* 1.43*  CALCIUM  7.3* 8.3* 7.2* 8.1* 7.6* 8.5* 8.9  PHOS 3.2 3.4 2.0* 1.9* 4.2 4.3 3.0   CBC Recent Labs  Lab 07/26/24 0409 07/27/24 0424 07/28/24 0500 07/29/24 0434  WBC 17.5* 20.3* 21.5* 21.6*  HGB 7.3* 7.0* 7.3* 7.0*  HCT 21.8* 20.9* 21.4* 20.8*  MCV 89.7 88.2 87.3 87.8  PLT 175 245 352 403*    Medications:     sodium chloride    Intravenous Once   arformoterol   15 mcg Nebulization BID   Chlorhexidine  Gluconate Cloth  6 each Topical Daily   collagenase    Topical Daily   [START ON 08/03/2024] darbepoetin (ARANESP ) injection - DIALYSIS  60 mcg Subcutaneous Q Sat-1800   fentaNYL  (SUBLIMAZE ) injection  25-50 mcg Intravenous Once   insulin  aspart  0-15 Units Subcutaneous Q4H   lidocaine -EPINEPHrine   20 mL Intradermal Once   midodrine   10 mg Per Tube TID WC   mouth rinse  15 mL Mouth Rinse Q2H   pantoprazole  (  PROTONIX ) IV  40 mg Intravenous Q24H   polyethylene glycol  17 g Per Tube Daily   revefenacin   175 mcg Nebulization Daily   sodium chloride  flush  10 mL Intrapleural Q8H   sodium chloride  flush  10-40 mL Intracatheter Q12H    Gordy Blanch, MD 07/29/2024, 8:10 AM

## 2024-07-29 NOTE — Progress Notes (Signed)
 PHARMACY - ANTICOAGULATION CONSULT NOTE  Pharmacy Consult:  Bivalirudin   Indication: atrial fibrillation and internal jugular thombus   Allergies  Allergen Reactions   Ace Inhibitors Swelling   Diphenhydramine Other (See Comments)    Hyperactive/jittery    Doxycycline Hyclate Nausea And Vomiting   Codeine Nausea And Vomiting    Patient Measurements: Height: 5' 1 (154.9 cm) Weight: 71.1 kg (156 lb 12 oz) IBW/kg (Calculated) : 47.8 HEPARIN  DW (KG): 63.1  Vital Signs: Temp: 97.7 F (36.5 C) (11/24 1528) Temp Source: Axillary (11/24 1528) BP: 128/55 (11/24 1900) Pulse Rate: 95 (11/24 1900)  Labs: Recent Labs    07/27/24 0424 07/27/24 1812 07/28/24 0500 07/28/24 1700 07/28/24 2215 07/29/24 0434 07/29/24 1654  HGB 7.0*  --  7.3*  --   --  7.0*  --   HCT 20.9*  --  21.4*  --   --  20.8*  --   PLT 245  --  352  --   --  403*  --   APTT 80*   < > 76*   < > 69* 71* 77*  CREATININE 2.83*  --  2.04*  --   --  1.43*  --    < > = values in this interval not displayed.    Estimated Creatinine Clearance: 30.6 mL/min (A) (by C-G formula based on SCr of 1.43 mg/dL (H)).  Assessment: 73 YOF with recurrent Afib to resume bivalirudin .  She was previously on IV heparin  and was switched to bivalirudin  due to labile heparin  levels.  Med was stopped because it was thought that Afib was secondary to critical illness, which resolved. However patient went back into Afib w/ RVR on 11/7 and bivalirudin  has been resumed.  aPTT 77 and at goal on 0.028 mg/kg/hr  Goal of Therapy:  aPTT 50-85 seconds Monitor platelets by anticoagulation protocol: Yes   Plan:  Continue bivalrudin at 0.028 mg/kg/hr (Wt: 66 kg) aPTT  BID Daily CBC  Thank you for allowing pharmacy to participate in this patient's care.  Sharyne Glatter, PharmD, BCCCP Critical Care Clinical Pharmacist 07/29/2024 7:06 PM

## 2024-07-29 NOTE — Progress Notes (Signed)
 .  NAMELUWANNA Waller, MRN:  [id number], DOB:  1946-07-06, LOS: 34 ADMISSION DATE:  06/25/2024, CONSULTATION DATE:  06/25/2024 REFERRING MD:  Theodoro SAUNDERS, CHIEF COMPLAINT:  AMS, Sepsis, AR   History of Present Illness:  Katie Waller is a 78 yo female with past medical history significant for HTN, COPD, acute on chronic hypoxic, hypercapnic respiratory failure on 5-6L [place] baseline, diastolic heart failure, SCC Left lower lobe s/p radiation in September 2025, who presented via EMS after being found unresponsive at home. Code stroke called. On arrival to ED patient emergently intubated for acute respiratory distress and airway protection. CT Head with no ICH or large infarct, CTA head and neck with no LVO. CXR concerning for a Pneumonia with visible BL lung apices on CTA demonstrating infection/inflammation. Code stroke cancelled with presentation suspicious for sepsis. Patient received 30ml/kg crystalloid, pan cultured, and started empirically on Cefepime /Vanc/Azithromycin . Of note, family reports patient has recently not felt well and likely has had a cold. PCCM consulted for ICU admission.   On exam in ED, patient febrile to >101 and hypotensive, likely multifactorial in setting of presumed sepsis and recent sedation on induction with underlying heart disease. Norepinephrine  gtt was initiated for MAPs < 65 and patient was transferred to ICU with plans to gain central access for vasopressor administration and arterial line placement for hemodynamic monitoring.  Pertinent  Medical History   Past Medical History:  Diagnosis Date   Abnormal finding on EKG 09/19/2013   Acute on chronic respiratory failure with hypercapnia (HCC)    Acute respiratory failure (HCC) 06/08/2016   Anxiety 11/09/2020   Arthritis    Asthma    Carotid bruit 11/09/2020   Chronic diastolic CHF (congestive heart failure) (HCC) 07/28/2017   Chronic obstructive pulmonary disease, unspecified (HCC) 11/09/2020   COPD, group  D, by GOLD 2017 classification (HCC) 09/17/2013   Cough 11/09/2020   Decreased estrogen level 11/09/2020   Diabetes (HCC)    Dyspnea    Edema 11/09/2020   Essential hypertension 09/18/2013   Goals of care, counseling/discussion    Hardening of the aorta (main artery of the heart) 11/09/2020   Heart disease    History of radiation therapy    Left Lung- 06/20/23-06/26/23- Dr. 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   Transient ischemic attack 11/09/2020   Venous stasis of both lower extremities 06/13/2016     Significant Hospital Events: Including procedures, antibiotic start and stop dates in addition to other pertinent events   10/21 Found unresponsive at home->ED>Code stroke>intubated for ARD/airway protection>CTH/CTA negative>Code stroke cancelled>Hypotensive>started on pressors>Admit to ICU  Arterial line placed 10/21 10/22 and 10/23 unsuccessful extubation 10/24: Despite maximal Precedex  and fentanyl  pushes, unable to safely and comfortably clean the patient up so added prn Versed  and started fentanyl  infusion. Rectal tube added for diarrhea.  10/24: 2/3 BC resulted in staph capitis/epidermitis, extubated on BiPAP 10/25: patient remained on BiPAP for 24 hours, when she came off of BiPAP, could not tolerate became tachypneic, tachycardic went into respiratory distress requiring endotracheal reintubation 10/29: unsuccessful SBT,  went briefly back into RVR 10/30: unsuccessful weaning from sedation as BP/HR increased  considerably 11/2: extubated to HFNC 11/8: progressively became more hypoxic with decreased respiratory drive prompting ABG this a.m. which revealed toxic hypercapnic respiratory failure.  Transferred back to ICU for impending intubation; tried draining pleural spaces but still tired out 11/10: trach 11/10: aggressive diuresis with Lasix  80 followed by 160 followed by 260 + metolazone  and bicarb - no response>Nephrology consulted 11/11: nephro consult. LE weakness noted on exam with inability to move - neuro consulted, MRI C and T spine ordered. CT pulled yesterday, worsening ptx>2 R CT placed, ptx improved. CRRT started. Hypotensive>briefly on Levo 11/12: Levo stopped. MRI C spine>cervical stenosis.  11/13 Off TF.  11/14: Worsening hypotension in setting of tension pneumothorax. Resolved after chest tube was flushed with improved pressor requirement. Levophed  now 5 mcg/min 11/15 back on TF 11/16 started zosyn  for leukocytosis 11/17 TF held. Started on Ketamine . Received 1 pRBC 11/18 Art line came out  11/19 ABI ordered > undetectable doppler waveforms bilateral PTA/DPA > 11/20 CTA > Not a candidate for aortobifemoral bypass versus ax bifemoral 11/20: tolerated iHD without vasopressor infusion. This was on midodrine  10 mg Q8H. Vascular advised limited management options at this time (palliative AKA as tissue loss progresses). 11/21: called [facility] to request transfer to their facility at family's request for a second opinion. MD-to-MD conversation was conducted. Their intensivist then spoke to their vascular surgeon who advised that on their review the recommendations provided by our vascular surgery team were appropriate and transfer would be declined due to no higher level of care being required / services being available at current facility. This was conveyed to the family in real-time and there were no clarifying questions from family who were appreciative of the secondary  confirmation this provided. 11/22: Did well off fentanyl  without any boluses throughout the day. 11/23 Ketamine  stopped   Interim History / Subjective:  O/N events: None  Open eyes, does not follow commands.  Objective    Blood pressure (!) 135/55, pulse 97, temperature 99 F (37.2 C), temperature source Axillary, resp. rate (!) 33, height 5' 1 (1.549 m), weight 71.1 kg, SpO2 100%. CVP:  [9 mmHg] 9 mmHg  Vent Mode: PRVC FiO2 (%):  [40 %] 40 % Set Rate:  [25 bmp] 25 bmp Vt Set:  [380 mL] 380 mL PEEP:  [5 cmH20] 5 cmH20 Plateau Pressure:  [16 cmH20-23 cmH20] 21 cmH20   Intake/Output Summary (Last 24 hours) at 07/29/2024 1315 Last data filed at 07/29/2024 1200 Gross per 24 hour  Intake 2269.33 ml  Output 5075 ml  Net -2805.67 ml   Filed Weights   07/28/24 1356 07/28/24 1805 07/29/24 0500  Weight: 71.1 kg 67.3 kg 71.1 kg    Examination: General: ill appearing  HENT: NGT with bilious output  Neck: Trach in place  Lungs: Rhonchorus on MV  Cardiovascular: irregular  Abdomen: distended but soft Extremities: Cold bilateral feet with darkening skin changes, no dp and pT pulses palpated.  Neuro: Awakens, does not follow commands  GU: No foley   UOP 0   Chest tube: ~50 NGT: ~1.3 L  I/O -2.9 L  Pertinent Labs: WBC 21.6 (21.5), Hgb 7.0 (7.3), PLT 403 Mag 2.0, Na 137, K 3.5, Scr 1.43 (2.04), BUN 63 (81), Phos 3.0  AST 46/ ALT 94  Trig 69   Resolved problem list  Acute metabolic encephalopathy  Human metapneumovirus pneumonia CRRT-related hypotension Hypokalemia Hypomagnesemia Hypoglycemia    Assessment  and Plan   Acute on chronic hypoxic & hypercapnic respiratory failure s/p trach 11/9 Fluid overload - baseline weight per daughter around 146-150 lbs BL effusions: s/p thoracentesis 11/8 with 600 cc transudative fluid Right PTX s/p chest tube: #1 R pigtail CT pulled 11/10; #2 R CT remained; immediate recurring ptx>R pigtail re-placed>both CT to - . 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 There is hyperpigmentation/darkening of the skin dorsal and ventral surfaces of the feet (images on the chart).  - US  ABI/TBI with undetectable doppler waveforms bilateral PTA/DPA.  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 -Appreciate NS's management; no plans for intervention of cervical stenosis given overall condition -PT, OT as patient tolerates    Afib with RVR, new onset, paroxysmal Chronic HFpEF  HTN -IV amiodarone  & bival, Previously had difficulty with heparin  dosing due to very labile heparin  levels  -tele monitoring -monitor electrolytes and replete as needed    RIJ CVL associated blood clot - Continue bivalirudin    AOC anemia  S/p 1  pRBC 11/17, s/p 1 pRBC 11/19  - Transfuse if Hgb < 7 - Consider ESA per nephrology  - no IV iron with concern for active infection   Anuric AKI Likely 2/2 sepsis/pneumonia and the use of antibiotics  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], 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, 11/20, 11/22, 11/23] - Nephrology following, iHD next on WF, then MWF  - PRN Midodrine  10 mg TID, can increase it to Midodrine  15 TID if needed for BP support  - Bicarb gtt stopped - avoid nephrotoxins - strict I/O - renally dose meds, avoid nephrotoxic meds   Ileus - has been an ongoing issue; has not had adequate enteral nutrition in about 3 weeks  No BS on exam, abd is distended  Last recorded LBM 11/14  NGT to wall suction with 1.3 L output  Methylnaltrexone  11/15, 11/16, 11/18 Neostigmine  11/16-11/17  Erythromycin  11/17, 11/18  - Will try lactulose  enemas today to facilitate BM   Sacrum wound and bilateral buttock, bilateral thigh, DTI  - Wound care following, recs:  - Continue applying Santyl  to bilat buttocks and sacrum Q day, then cover with moist gauze and sacrum foam dressing.   -  Change foam dressing Q 3 days or PRN soiling  DVT PPX: Angiomax  GI PPX: Protonix   Code Status: Full scope of care as of today    Labs   CBC: Recent Labs  Lab 07/25/24 0421 07/26/24 0409 07/27/24 0424 07/28/24 0500 07/29/24 0434  WBC 19.6* 17.5* 20.3* 21.5* 21.6*  HGB 8.8* 7.3* 7.0* 7.3* 7.0*  HCT 26.3* 21.8* 20.9* 21.4* 20.8*  MCV 89.8 89.7 88.2 87.3 87.8  PLT 165 175 245 352 403*    Basic Metabolic Panel: Recent Labs  Lab 07/26/24 0409 07/26/24 0722 07/27/24 0424 07/28/24 0500 07/29/24 0434  NA 132* 133* 132* 137 137  K 3.1* 3.3* 3.4* 4.1 3.5  CL 86* 94* 85* 98 94*  CO2 21* 23 18* 22 26  GLUCOSE 266* 158* 278* 189* 159*  BUN 56* 66* 93* 81* 63*  CREATININE 2.03* 1.98* 2.83* 2.04* 1.43*  CALCIUM  7.2* 8.1* 7.6* 8.5* 8.9  MG 1.7 1.7 2.2 2.1 2.0  PHOS 2.0* 1.9* 4.2 4.3 3.0   GFR: Estimated Creatinine Clearance: 30.6 mL/min  (A) (by C-G formula based on SCr of 1.43 mg/dL (H)). Recent Labs  Lab 07/26/24 0409 07/27/24 0424 07/28/24 0500 07/29/24 0434  WBC 17.5* 20.3* 21.5* 21.6*    Liver Function Tests: Recent Labs  Lab 07/25/24 0421 07/26/24 0409 07/27/24 0424 07/28/24 0500 07/29/24 0434  AST 136*  --   --   --  46*  ALT 337*  --   --   --  94*  ALKPHOS 146*  --   --   --  136*  BILITOT 0.7  --   --   --  0.6  PROT 5.3*  --   --   --  6.2*  ALBUMIN  <1.5* 1.6* <1.5* 1.8* 2.2*   No results for input(s): LIPASE, AMYLASE in the last 168 hours. No results for input(s): AMMONIA in the last 168 hours.  ABG    Component Value Date/Time   PHART 7.370 07/24/2024 0456   PCO2ART 53.3 (H) 07/24/2024 0456   PO2ART 107 07/24/2024 0456   HCO3 30.9 (H) 07/24/2024 0456   TCO2 32 07/24/2024 0456   ACIDBASEDEF 2.0 07/19/2024 2016   O2SAT 98 07/24/2024 0456     Coagulation Profile: No results for input(s): INR, PROTIME in the last 168 hours.  Cardiac Enzymes: No results for input(s): CKTOTAL, CKMB, CKMBINDEX, TROPONINI in the last 168 hours.  HbA1C: Hgb A1c MFr Bld  Date/Time Value Ref Range Status  06/26/2024 04:47 AM 5.6 4.8 - 5.6 % Final    Comment:    (NOTE) Diagnosis of Diabetes The following HbA1c ranges recommended by the American Diabetes Association (ADA) may be used as an aid in the diagnosis of diabetes mellitus.  Hemoglobin             Suggested A1C NGSP%              Diagnosis  <5.7                   Non Diabetic  5.7-6.4                Pre-Diabetic  >6.4                   Diabetic  <7.0                   Glycemic control for  adults with diabetes.    09/17/2013 01:57 PM 6.1 (H) <5.7 % Final    Comment:    (NOTE)                                                                       According to the ADA Clinical Practice Recommendations for 2011, when HbA1c is used as a screening test:  >=6.5%   Diagnostic of Diabetes Mellitus            (if abnormal result is confirmed) 5.7-6.4%   Increased risk of developing Diabetes Mellitus References:Diagnosis and Classification of Diabetes Mellitus,Diabetes Care,2011,34(Suppl 1):S62-S69 and Standards of Medical Care in         Diabetes - 2011,Diabetes Care,2011,34 (Suppl 1):S11-S61.    CBG: Recent Labs  Lab 07/28/24 1934 07/28/24 2320 07/29/24 0315 07/29/24 0741 07/29/24 1133  GLUCAP 155* 127* 140* 152* 157*    Review of Systems:   As above   Past Medical History:  She,  has a past medical history of Abnormal finding on EKG (09/19/2013), Acute on chronic respiratory failure with hypercapnia (HCC), Acute respiratory failure (HCC) (06/08/2016), Anxiety (11/09/2020), Arthritis, Asthma, Carotid bruit (11/09/2020), Chronic diastolic CHF (congestive heart failure) (HCC) (07/28/2017), Chronic obstructive pulmonary disease, unspecified (HCC) (11/09/2020), COPD, group D, by GOLD 2017 classification (HCC) (09/17/2013), Cough (11/09/2020), Decreased estrogen level (11/09/2020), Diabetes (HCC), Dyspnea, Edema (11/09/2020), Essential hypertension (09/18/2013), Goals of care, counseling/discussion, Hardening of the aorta (main artery of the heart) (11/09/2020), Heart disease, History of radiation therapy, Hypertension, Hypertensive heart failure (HCC) (11/09/2020), Hypoxia (11/09/2020), Insomnia (11/09/2020), Iron deficiency anemia (11/09/2020), Large liver (11/09/2020), Near syncope (09/17/2013), Osteoporosis (11/09/2020), Palliative care encounter, Prediabetes (09/19/2013), Pure hypercholesterolemia (11/09/2020), Raynaud's disease (11/09/2020), Sinus tachycardia (09/18/2013), Skin sensation disturbance (11/09/2020), Smoker (09/17/2013), 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   Surgical History:   Past Surgical History:  Procedure Laterality Date   BRONCHIAL BIOPSY  05/25/2023    Procedure: BRONCHIAL BIOPSIES;  Surgeon: Brenna Adine CROME, DO;  Location: [facility];  Service: Pulmonary;;   BRONCHIAL NEEDLE ASPIRATION BIOPSY  05/25/2023   Procedure: BRONCHIAL NEEDLE ASPIRATION BIOPSIES;  Surgeon: Brenna Adine CROME, DO;  Location: [facility];  Service: Pulmonary;;   FIDUCIAL MARKER PLACEMENT  05/25/2023   Procedure: FIDUCIAL MARKER PLACEMENT;  Surgeon: Brenna Adine CROME, DO;  Location: [facility];  Service: Pulmonary;;   KNEE SURGERY Left      Social History:   reports that she quit smoking about 7 years ago. Her smoking use included cigarettes. She started smoking about 7 years ago. She has a 55 pack-year smoking history. She has never used smokeless tobacco. She reports that she does not drink alcohol and does not use drugs.   Family History:  Her family history includes Hyperlipidemia in her father and mother; Hypertension in her father and mother.   Allergies Allergies  Allergen Reactions   Ace Inhibitors Swelling   Diphenhydramine Other (See Comments)    Hyperactive/jittery    Doxycycline Hyclate Nausea And Vomiting   Codeine Nausea And Vomiting     Home Medications  Prior to Admission medications   Medication Sig Start Date End Date Taking? Authorizing Provider  acetaminophen  (TYLENOL ) 500 MG tablet Take  1,000 mg by mouth every 6 (six) hours as needed for moderate pain.   Yes [provider]  albuterol  (PROVENTIL  HFA;VENTOLIN  HFA) 108 (90 BASE) MCG/ACT inhaler Inhale 2 puffs into the lungs every 6 (six) hours as needed for wheezing or shortness of breath. 09/19/13  Yes Johnson, Clanford L, MD  aspirin  81 MG chewable tablet Chew 1 tablet (81 mg total) by mouth daily. 09/19/13  Yes Johnson, Clanford L, MD  atorvastatin  (LIPITOR) 10 MG tablet TAKE 1 TABLET BY MOUTH EVERY DAY 07/21/23  Yes Krasowski, Robert J, MD  benzonatate  (TESSALON ) 100 MG capsule Take 100 mg by mouth 3 (three) times daily as needed for cough.    Yes [provider]  Biotin  10 MG CAPS Take 10 mg by mouth daily.   Yes [provider]  budesonide -formoterol  (SYMBICORT ) 160-4.5 MCG/ACT inhaler Inhale 2 puffs into the lungs 2 (two) times daily. 08/23/19  Yes Mannam, Praveen, MD  calcium  carbonate (OSCAL) 1500 (600 Ca) MG TABS tablet Take 600 mg of elemental calcium  by mouth daily.   Yes [provider]  Cholecalciferol (VITAMIN D ) 50 MCG (2000 UT) tablet Take 2,000 Units by mouth daily.   Yes [provider]  diphenhydramine-acetaminophen  (TYLENOL  PM) 25-500 MG TABS tablet Take 1 tablet by mouth at bedtime.   Yes [provider]  furosemide  (LASIX ) 40 MG tablet Take 40 mg by mouth daily.   Yes [provider]  gabapentin  (NEURONTIN ) 300 MG capsule Take 900 mg by mouth at bedtime.   Yes [provider]  iron polysaccharides (NIFEREX) 150 MG capsule Take 150 mg by mouth daily. 11/05/20  Yes [provider]  loratadine (CLARITIN) 10 MG tablet Take 10 mg by mouth daily as needed for allergies.   Yes [provider]  LORazepam  (ATIVAN ) 1 MG tablet Take 1 tablet (1 mg total) by mouth every 6 (six) hours as needed for anxiety. 08/07/17  Yes Drusilla Sabas RAMAN, MD  losartan  (COZAAR ) 50 MG tablet Take 50 mg by mouth daily.   Yes [provider]  Multiple Vitamin (MULTIVITAMIN WITH MINERALS) TABS tablet Take 1 tablet by mouth daily.   Yes [provider]  raloxifene  (EVISTA ) 60 MG tablet Take 60 mg by mouth every morning. 08/07/20  Yes [provider]  Tiotropium Bromide  Monohydrate (SPIRIVA  RESPIMAT) 2.5 MCG/ACT AERS Inhale 1 puff into the lungs daily.   Yes [provider]  traZODone  (DESYREL ) 100 MG tablet Take 1 tablet (100 mg total) by mouth at bedtime as needed for sleep. 08/07/17  Yes Drusilla Sabas RAMAN, MD     Critical care time:

## 2024-07-29 NOTE — Progress Notes (Signed)
 Daily Progress Note   Patient Name: Katie Waller       Date: 07/29/2024 DOB: [birth year]  Age: 78 y.o. MRN#: [id number] Attending Physician: Meade Verdon RAMAN, MD Primary Care Physician: Sun, Vyvyan, MD Admit Date: 06/25/2024  Reason for Consultation/Follow-up: Establishing goals of care  Length of Stay: 34  Current Medications: Scheduled Meds:   sodium chloride    Intravenous Once   arformoterol   15 mcg Nebulization BID   Chlorhexidine  Gluconate Cloth  6 each Topical Daily   collagenase    Topical Daily   [START ON 08/03/2024] darbepoetin (ARANESP ) injection - DIALYSIS  60 mcg Subcutaneous Q Sat-1800   fentaNYL  (SUBLIMAZE ) injection  25-50 mcg Intravenous Once   insulin  aspart  0-15 Units Subcutaneous Q4H   lidocaine -EPINEPHrine   20 mL Intradermal Once   mouth rinse  15 mL Mouth Rinse Q2H   pantoprazole  (PROTONIX ) IV  40 mg Intravenous Q24H   polyethylene glycol  17 g Per Tube Daily   revefenacin   175 mcg Nebulization Daily   sodium chloride  flush  10 mL Intrapleural Q8H   sodium chloride  flush  10-40 mL Intracatheter Q12H    Continuous Infusions:  amiodarone  30 mg/hr (07/29/24 1200)   bivalirudin  (ANGIOMAX ) 250 mg in sodium chloride  0.9 % 500 mL (0.5 mg/mL) infusion 0.028 mg/kg/hr (07/29/24 1200)   TPN ADULT (ION) 60 mL/hr at 07/29/24 1200   TPN ADULT (ION)      PRN Meds: alteplase , fentaNYL  (SUBLIMAZE ) injection, heparin , hydrALAZINE , ipratropium-albuterol , lidocaine  (PF), lidocaine -prilocaine , midodrine , ondansetron  (ZOFRAN ) IV, mouth rinse, pentafluoroprop-tetrafluoroeth, polyethylene glycol, simethicone , sodium chloride  flush  Physical Exam          Vital Signs: BP (!) 135/55   Pulse 97   Temp 99 F (37.2 C) (Axillary)   Resp (!) 33   Ht 5' 1 (1.549 m)   Wt 71.1  kg   SpO2 100%   BMI 29.62 kg/m  SpO2: SpO2: 100 % O2 Device: O2 Device: Ventilator O2 Flow Rate: O2 Flow Rate (L/min): 40 L/min       Palliative Assessment/Data: 10%      Patient Active Problem List   Diagnosis Date Noted   Dependence on renal dialysis 07/27/2024   Intestinal occlusion (HCC) 07/27/2024   On total parenteral nutrition (TPN) 07/27/2024   ESRD on dialysis (HCC) 07/24/2024   Severe protein-calorie  malnutrition 07/24/2024   Critical limb ischemia of both lower extremities (HCC) 07/24/2024   Tracheostomy status (HCC) 07/24/2024   Ileus (HCC) 07/23/2024   Aspiration into airway 07/23/2024   AKI (acute kidney injury) 07/16/2024   Pressure injury of skin 07/16/2024   Pneumothorax 07/16/2024   Hyperglycemia 07/03/2024   CKD (chronic kidney disease) stage 2, GFR 60-89 ml/min 07/02/2024   On mechanically assisted ventilation (HCC) 07/01/2024   Human metapneumovirus (hMPV) pneumonia 07/01/2024   Viral pneumonia 07/01/2024   COPD with acute exacerbation (HCC) 07/01/2024   Acute on chronic respiratory failure with hypoxia and hypercapnia (HCC) 06/28/2024   Sepsis (HCC) 06/25/2024   Hypotension 06/25/2024   Septic shock (HCC) 06/25/2024   Chronic hypoxemic respiratory failure (HCC) 02/15/2024   Cancer of lower lobe of left lung (HCC) 02/15/2024   Bilateral carotid artery stenosis 02/15/2024   Lung nodule 05/18/2023   Chronic kidney disease, stage 3a (HCC) 11/26/2021   Vitamin D  deficiency 11/26/2021   Carotid arterial disease 11/26/2021   Anxiety 11/09/2020   Carotid bruit 11/09/2020   Cough 11/09/2020   Edema 11/09/2020   Hardening of the aorta (main artery of the heart) 11/09/2020   Hypertensive heart failure (HCC) 11/09/2020   Insomnia 11/09/2020   Large liver 11/09/2020   Multiple pulmonary nodules 11/09/2020   Osteoporosis 11/09/2020   History of colonic polyps 11/09/2020   Pure hypercholesterolemia 11/09/2020   Raynaud's disease 11/09/2020   Skin  sensation disturbance 11/09/2020   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   Transient ischemic attack 11/09/2020   Hypoxia 11/09/2020   Chronic obstructive pulmonary disease, unspecified (HCC) 11/09/2020   Decreased estrogen level 11/09/2020   Hypertension    Heart disease    Diabetes (HCC)    Asthma    Acute on chronic respiratory failure with hypercapnia (HCC)    Palliative care encounter    Goals of care, counseling/discussion    Chronic diastolic CHF (congestive heart failure) (HCC) 07/28/2017   Venous stasis of both lower extremities 06/13/2016   Acute on chronic respiratory failure with hypoxia (HCC) 06/08/2016   Abnormal finding on EKG 09/19/2013   Prediabetes 09/19/2013   Sinus tachycardia 09/18/2013   Essential hypertension 09/18/2013   Near syncope 09/17/2013   COPD, group D, by GOLD 2017 classification (HCC) 09/17/2013   Smoker 09/17/2013    Palliative Care Assessment & Plan   Patient Profile: 78 y.o. 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    Her course has been complicated by sepsis from pulmonary source, chronic respiratory failure requiring tracheostomy after several failed extubations, and persistent ventilator-dependence. It has also been complicated by AKI and iHD dependence, severe protein calorie malnutrition in setting of severe/refractory ileus requiring TPN, and identification of critical limb ischemia (bilateral lower extremities) for which management options are currently limited to palliative AKA in setting of multiorgan failure and severe malnutrition / impaired wound healing.    PMT consulted to discuss goals of care.   Today's Discussion: Reviewed chart and received update from nursing. Patient intubated  and sleeping. She does not awaken when I enter the room. Her daughter is at bedside. We reviewed our goals of care discussion Saturday. Daughter has reviewed the Hard Choices booklet. She remains very hopeful for a miracle. The family's goals are clear  to continue with full scope of care. PMT will sign off at this time. Please re-consult if needs arise.  Emotional support and therapeutic listening provided.   Recommendations/Plan: Full code Full scope of care PMT to sign off at this time. Please re-consult with needs.   Code Status:    Code Status Orders  (From admission, onward)           Start     Ordered   06/25/24 1125  Full code  Continuous       Question:  By:  Answer:  Consent: discussion documented in EHR   06/25/24 1126            Extensive chart review has been completed prior to seeing the patient including labs, vital signs, imaging, progress/consult notes, orders, medications, and available advance directive documents.  Care plan was discussed with bedside RN and attending providers  Time spent: 25 minutes  Thank you for allowing the Palliative Medicine Team to assist in the care of this patient.    Stephane CHRISTELLA Palin, NP  Please contact Palliative Medicine Team phone at [phone] for questions and concerns.

## 2024-07-29 NOTE — Progress Notes (Signed)
 [facility]  Subjective  -patient remains trached on vent   Objective (!) 135/55 97 99 F (37.2 C) (Axillary) (!) 33 100%  Intake/Output Summary (Last 24 hours) at 07/29/2024 1519 Last data filed at 07/29/2024 1200 Gross per 24 hour  Intake 2100.07 ml  Output 5075 ml  Net -2974.93 ml    Mottling to both lower extremities to the level of the ankle  Laboratory Lab Results: Recent Labs    07/28/24 0500 07/29/24 0434  WBC 21.5* 21.6*  HGB 7.3* 7.0*  HCT 21.4* 20.8*  PLT 352 403*   BMET Recent Labs    07/28/24 0500 07/29/24 0434  NA 137 137  K 4.1 3.5  CL 98 94*  CO2 22 26  GLUCOSE 189* 159*  BUN 81* 63*  CREATININE 2.04* 1.43*  CALCIUM  8.5* 8.9    COAG Lab Results  Component Value Date   INR 0.9 06/25/2024   INR 0.97 09/17/2013   No results found for: PTT  Assessment/Planning:  78 y.o. 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  This looks quite chronic to me and she has reconstituted flow at the common femoral arteries that are quite diseased also.  Ultimately she would best be served with aortobifemoral bypass versus ax bifemoral which she is not a candidate in her deconditioned state with multisystem organ failure.    Again discussed with her daughter would offer palliative bilateral above-knee amputations if her tissue loss progresses as we discussed but some chance that these would not heal with significant inflow disease.    Lonni JINNY Gaskins 07/29/2024 3:19 PM --

## 2024-07-29 NOTE — Progress Notes (Signed)
 PHARMACY - ANTICOAGULATION CONSULT NOTE  Pharmacy Consult:  Bivalirudin   Indication: atrial fibrillation and internal jugular thombus   Allergies  Allergen Reactions   Ace Inhibitors Swelling   Diphenhydramine Other (See Comments)    Hyperactive/jittery    Doxycycline Hyclate Nausea And Vomiting   Codeine Nausea And Vomiting    Patient Measurements: Height: 5' 1 (154.9 cm) Weight: 71.1 kg (156 lb 12 oz) IBW/kg (Calculated) : 47.8 HEPARIN  DW (KG): 63.1  Vital Signs: Temp: 97.6 F (36.4 C) (11/24 0336) Temp Source: Axillary (11/24 0336) BP: 144/57 (11/24 0615) Pulse Rate: 95 (11/24 0615)  Labs: Recent Labs    07/27/24 0424 07/27/24 1812 07/28/24 0500 07/28/24 1700 07/28/24 2215 07/29/24 0434  HGB 7.0*  --  7.3*  --   --  7.0*  HCT 20.9*  --  21.4*  --   --  20.8*  PLT 245  --  352  --   --  403*  APTT 80*   < > 76* 38* 69* 71*  CREATININE 2.83*  --  2.04*  --   --  1.43*   < > = values in this interval not displayed.    Estimated Creatinine Clearance: 30.6 mL/min (A) (by C-G formula based on SCr of 1.43 mg/dL (H)).  Assessment: 29 YOF with recurrent Afib to resume bivalirudin .  She was previously on IV heparin  and was switched to bivalirudin  due to labile heparin  levels.  Med was stopped because it was thought that Afib was secondary to critical illness, which resolved. However patient went back into Afib w/ RVR on 11/7 and bivalirudin  has been resumed.  aPTT 71 and at goal on 0.028 mg/kg/hr  Goal of Therapy:  aPTT 50-85 seconds Monitor platelets by anticoagulation protocol: Yes   Plan:  Continue bivalrudin at 0.028 mg/kg/hr (Wt: 66 kg) aPTT  BID Daily CBC  Thank you for allowing pharmacy to participate in this patient's care.  Rankin Sams, PharmD, BCPS, BCCCP Clinical Pharmacist

## 2024-07-29 NOTE — TOC Progression Note (Signed)
 Transition of Care [facility]) - Progression Note    Patient Details  Name: Katie Waller MRN: [id number] Date of Birth: 06/01/1946  Transition of [facility]) CM/SW Contact  Roxie KANDICE Stain, RN Phone Number: 07/29/2024, 5:03 PM  Clinical Narrative:    Kristeen with [employer] will start auth. Auth will be good for 5 days once approved.     Expected Discharge Plan: Long Term Acute Care (LTAC) Barriers to Discharge: Continued Medical Work up, [employer] and Services In-house Referral: Clinical Social Work Discharge Planning Services: [employer] Consult Post Acute Care Choice: Long Term Acute Care (LTAC) Living arrangements for the past 2 months: Single Family Home                                       Social Drivers of Health (SDOH) Interventions SDOH Screenings   Food Insecurity: No Food Insecurity (07/04/2024)  Housing: Low Risk  (07/04/2024)  Transportation Needs: No Transportation Needs (07/04/2024)  Utilities: Not At Risk (07/04/2024)  Depression (PHQ2-9): Low Risk  (06/05/2023)  Social Connections: Patient Unable To Answer (07/09/2024)  Tobacco Use: Medium Risk (06/25/2024)    Readmission Risk Interventions     No data to display

## 2024-07-29 NOTE — Progress Notes (Signed)
 PHARMACY - TOTAL PARENTERAL NUTRITION CONSULT NOTE  Indication: Ileus, Intolerance to EN  Patient Measurements: Height: 5' 1 (154.9 cm) Weight: 71.1 kg (156 lb 12 oz) IBW/kg (Calculated) : 47.8 TPN AdjBW (KG): 53.6 Body mass index is 29.62 kg/m.  Assessment:  50 YOF presented on 10/21 with PNA/COPD exacerbation, hospitalization complicated by ARF requiring CRRT, PTX, cervical stenosis/paralysis and Afib RVR.  Patient initially tolerated TF and was having diarrhea requiring fiber.  Ileus developed around 11/12 and TF/fiber were held, started on Reglan , Relistor  and neostigmine .  Trickle TF resumed on 11/15 and then held 11/17 given significant NG output and aspiration.  Pharmacy consulted to dose TPN given prolonged ileus.  Glucose / Insulin : hx preDM, A1c 5.6% - CBGs 108-197. Used 13 units SSI in the past 24 hrs Electrolytes: K 3.5 (goal >/= 4), CoCa ~10.3, Mag 2.0 (goal >/=2), Phos 3.0  Renal: CRRT 11/11 >> 11/16, HD 11/20, again 11/22 & 11/23 then MWF - BUN down 63 Hepatic: LFTs improving AST 46, ALT 94 (not d/t TPN), tbili 0.6, albumin  2.2, TG 69 (11/24) Intake / Output; MIVF: NG 1325 mL, stool 0 mL, chest tube 50 mL GI meds: PRN erythromycin  and Relistor , miralax   GI Imaging: none since TPN initiation GI Surgeries / Procedures: none since TPN initiation  Central access: PICC placed 07/12/24 TPN start date: 07/22/24  Nutritional Goals: Goal concentrated TPN rate is 60 mL/hr to provide 1738 kCal and 115g AA per day  RD Estimated Needs Total Energy Estimated Needs: 1700-1900 Total Protein Estimated Needs: 110-125 grams Total Fluid Estimated Needs: >/=1.5L  Current Nutrition:  TPN  Plan: 20 mEq KCL IV today to maintain > 4 in the setting of ileus  Continue concentrated TPN at goal rate 60 mL/hr at 1800 to provide 100% of needs Electrolytes in TPN: Na 150 mEq/L, K 18 mEq/L (= 25 mEq/day), Ca 2.5 mEq/L, Mg 4 mEq/L, Phos 5 mmol/L (= 7 mmol/day), Cl:Ac 2:1  Add standard MVI and  trace elements to TPN (D/C BComplex) Continue moderate SSI Q4H  Standard TPN labs Mon/Thurs.  Renal function panel BID and daily Mag per Renal  Rankin Sams, PharmD, BCPS, BCCCP Clinical Pharmacist

## 2024-07-29 NOTE — Plan of Care (Signed)
  Problem: Clinical Measurements: Goal: Ability to maintain clinical measurements within normal limits will improve Outcome: Progressing   Problem: Clinical Measurements: Goal: Will remain free from infection Outcome: Progressing   Problem: Nutrition: Goal: Adequate nutrition will be maintained Outcome: Progressing   Problem: Pain Managment: Goal: General experience of comfort will improve and/or be controlled Outcome: Progressing   Problem: Safety: Goal: Ability to remain free from injury will improve Outcome: Progressing   Problem: Health Behavior/Discharge Planning: Goal: Ability to identify and utilize available resources and services will improve Outcome: Progressing   Problem: Fluid Volume: Goal: Hemodynamic stability will improve Outcome: Progressing   Problem: Clinical Measurements: Goal: Signs and symptoms of infection will decrease Outcome: Progressing   Problem: Activity: Goal: Ability to tolerate increased activity will improve Outcome: Progressing   Problem: Respiratory: Goal: Ability to maintain a clear airway will improve Outcome: Progressing

## 2024-07-30 ENCOUNTER — Inpatient Hospital Stay (HOSPITAL_COMMUNITY)

## 2024-07-30 DIAGNOSIS — J9622 Acute and chronic respiratory failure with hypercapnia: Secondary | ICD-10-CM | POA: Diagnosis not present

## 2024-07-30 DIAGNOSIS — K567 Ileus, unspecified: Secondary | ICD-10-CM | POA: Diagnosis not present

## 2024-07-30 DIAGNOSIS — D631 Anemia in chronic kidney disease: Secondary | ICD-10-CM

## 2024-07-30 DIAGNOSIS — J9621 Acute and chronic respiratory failure with hypoxia: Secondary | ICD-10-CM | POA: Diagnosis not present

## 2024-07-30 DIAGNOSIS — J449 Chronic obstructive pulmonary disease, unspecified: Secondary | ICD-10-CM | POA: Diagnosis not present

## 2024-07-30 LAB — APTT: aPTT: 69 s — ABNORMAL HIGH (ref 24–36)

## 2024-07-30 LAB — GLUCOSE, CAPILLARY
Glucose-Capillary: 137 mg/dL — ABNORMAL HIGH (ref 70–99)
Glucose-Capillary: 145 mg/dL — ABNORMAL HIGH (ref 70–99)
Glucose-Capillary: 147 mg/dL — ABNORMAL HIGH (ref 70–99)
Glucose-Capillary: 175 mg/dL — ABNORMAL HIGH (ref 70–99)
Glucose-Capillary: 197 mg/dL — ABNORMAL HIGH (ref 70–99)
Glucose-Capillary: 197 mg/dL — ABNORMAL HIGH (ref 70–99)

## 2024-07-30 LAB — RENAL FUNCTION PANEL
Albumin: 1.7 g/dL — ABNORMAL LOW (ref 3.5–5.0)
Anion gap: 20 — ABNORMAL HIGH (ref 5–15)
BUN: 118 mg/dL — ABNORMAL HIGH (ref 8–23)
CO2: 20 mmol/L — ABNORMAL LOW (ref 22–32)
Calcium: 8.9 mg/dL (ref 8.9–10.3)
Chloride: 100 mmol/L (ref 98–111)
Creatinine, Ser: 2.37 mg/dL — ABNORMAL HIGH (ref 0.44–1.00)
GFR, Estimated: 21 mL/min — ABNORMAL LOW (ref >60)
Glucose, Bld: 214 mg/dL — ABNORMAL HIGH (ref 70–99)
Phosphorus: 5.8 mg/dL — ABNORMAL HIGH (ref 2.5–4.6)
Potassium: 4.1 mmol/L (ref 3.5–5.1)
Sodium: 140 mmol/L (ref 135–145)

## 2024-07-30 LAB — CBC
HCT: 18.3 % — ABNORMAL LOW (ref 36.0–46.0)
Hemoglobin: 6.3 g/dL — CL (ref 12.0–15.0)
MCH: 30 pg (ref 26.0–34.0)
MCHC: 34.4 g/dL (ref 30.0–36.0)
MCV: 87.1 fL (ref 80.0–100.0)
Platelets: 445 K/uL — ABNORMAL HIGH (ref 150–400)
RBC: 2.1 MIL/uL — ABNORMAL LOW (ref 3.87–5.11)
RDW: 18.9 % — ABNORMAL HIGH (ref 11.5–15.5)
WBC: 18.8 K/uL — ABNORMAL HIGH (ref 4.0–10.5)
nRBC: 1.4 % — ABNORMAL HIGH (ref 0.0–0.2)

## 2024-07-30 LAB — HEPARIN LEVEL (UNFRACTIONATED): Heparin Unfractionated: <0.1 [IU]/mL — ABNORMAL LOW (ref 0.30–0.70)

## 2024-07-30 LAB — PREPARE RBC (CROSSMATCH)

## 2024-07-30 LAB — MAGNESIUM: Magnesium: 2.3 mg/dL (ref 1.7–2.4)

## 2024-07-30 MED ORDER — ALBUMIN HUMAN 25 % IV SOLN
INTRAVENOUS | Status: AC
  Filled 2024-07-30: qty 200

## 2024-07-30 MED ORDER — NEOSTIGMINE METHYLSULFATE 10 MG/10ML IV SOLN
0.5000 mg | Freq: Two times a day (BID) | INTRAVENOUS | Status: AC
  Administered 2024-07-30 – 2024-07-31 (×3): 0.5 mg via SUBCUTANEOUS
  Filled 2024-07-30 (×3): qty 0.5

## 2024-07-30 MED ORDER — LACTATED RINGERS IV BOLUS
500.0000 mL | Freq: Once | INTRAVENOUS | Status: AC
  Administered 2024-07-30: 500 mL via INTRAVENOUS

## 2024-07-30 MED ORDER — AMIODARONE HCL 200 MG PO TABS
200.0000 mg | ORAL_TABLET | Freq: Every day | ORAL | Status: DC
  Administered 2024-07-30 – 2024-07-31 (×2): 200 mg
  Filled 2024-07-30 (×2): qty 1

## 2024-07-30 MED ORDER — OXYCODONE HCL 5 MG PO TABS
5.0000 mg | ORAL_TABLET | Freq: Four times a day (QID) | ORAL | Status: DC
  Administered 2024-07-30 (×2): 5 mg
  Filled 2024-07-30 (×2): qty 1

## 2024-07-30 MED ORDER — HEPARIN (PORCINE) 25000 UT/250ML-% IV SOLN
800.0000 [IU]/h | INTRAVENOUS | Status: DC
  Administered 2024-07-30: 400 [IU]/h via INTRAVENOUS
  Filled 2024-07-30: qty 250

## 2024-07-30 MED ORDER — NOREPINEPHRINE 4 MG/250ML-% IV SOLN
0.0000 ug/min | INTRAVENOUS | Status: DC
  Administered 2024-07-30: 10 ug/min via INTRAVENOUS
  Administered 2024-07-31: 40 ug/min via INTRAVENOUS
  Filled 2024-07-30: qty 250

## 2024-07-30 MED ORDER — ALBUMIN HUMAN 25 % IV SOLN
25.0000 g | Freq: Once | INTRAVENOUS | Status: AC
  Administered 2024-07-30: 25 g via INTRAVENOUS

## 2024-07-30 MED ORDER — BISACODYL 10 MG RE SUPP
10.0000 mg | Freq: Every day | RECTAL | Status: DC
  Administered 2024-07-30 – 2024-07-31 (×2): 10 mg via RECTAL
  Filled 2024-07-30 (×2): qty 1

## 2024-07-30 MED ORDER — TRACE MINERALS CU-MN-SE-ZN 300-55-60-3000 MCG/ML IV SOLN
INTRAVENOUS | Status: DC
  Filled 2024-07-30: qty 384

## 2024-07-30 MED ORDER — SENNA 8.6 MG PO TABS
2.0000 | ORAL_TABLET | Freq: Every day | ORAL | Status: DC
  Administered 2024-07-30 – 2024-07-31 (×2): 17.2 mg
  Filled 2024-07-30 (×2): qty 2

## 2024-07-30 MED ORDER — VASOPRESSIN 20 UNITS/100 ML INFUSION FOR SHOCK
INTRAVENOUS | Status: AC
  Filled 2024-07-30: qty 100

## 2024-07-30 MED ORDER — VASOPRESSIN 20 UNITS/100 ML INFUSION FOR SHOCK
0.0000 [IU]/min | INTRAVENOUS | Status: DC
Start: 2024-07-31 — End: 2024-08-01
  Administered 2024-07-30 – 2024-07-31 (×2): 0.03 [IU]/min via INTRAVENOUS
  Filled 2024-07-30 (×2): qty 100

## 2024-07-30 MED ORDER — SODIUM CHLORIDE 0.9% IV SOLUTION: Freq: Once | INTRAVENOUS | Status: DC

## 2024-07-30 MED ORDER — ALBUMIN HUMAN 25 % IV SOLN: 12.5000 g | Freq: Once | INTRAVENOUS | Status: DC

## 2024-07-30 MED ORDER — POLYETHYLENE GLYCOL 3350 17 G PO PACK
17.0000 g | PACK | Freq: Two times a day (BID) | ORAL | Status: DC
  Administered 2024-07-31: 17 g
  Filled 2024-07-30: qty 1

## 2024-07-30 MED ORDER — SMOG ENEMA: 960.0000 mL | Freq: Every day | RECTAL | Status: DC | PRN

## 2024-07-30 MED ORDER — NOREPINEPHRINE 4 MG/250ML-% IV SOLN
INTRAVENOUS | Status: AC
  Filled 2024-07-30: qty 250

## 2024-07-30 MED ORDER — HEPARIN SODIUM (PORCINE) 1000 UNIT/ML IJ SOLN
INTRAMUSCULAR | Status: AC
  Filled 2024-07-30: qty 10

## 2024-07-30 MED ORDER — KATE FARMS STANDARD 1.4 EN LIQD
1000.0000 mL | ENTERAL | Status: DC
  Administered 2024-07-30 – 2024-07-31 (×2): 1000 mL
  Filled 2024-07-30 (×2): qty 1000

## 2024-07-30 NOTE — Progress Notes (Signed)
 PT Cancellation Note  Patient Details Name: Katie Waller MRN: [id number] DOB: 28-Aug-1946   Cancelled Treatment:    Reason Eval/Treat Not Completed: Patient at procedure or test/unavailable; currently undergoing HD.  Spoke with RN and daughter.  Will attempt tomorrow.   Montie Portal 07/30/2024, 1:08 PM Micheline Portal, PT Acute Rehabilitation Services Office:[phone] 07/30/2024

## 2024-07-30 NOTE — Progress Notes (Signed)
 OT Cancellation Note  Patient Details Name: Katie Waller MRN: [id number] DOB: November 23, 1945   Cancelled Treatment:    Reason Eval/Treat Not Completed: Other (comment) Attempted OT session post HD and pt politely deferred; reports pt extremely fatigued post HD. Will follow up for OT another day  Gene Glazebrook 07/30/2024, 1:25 PM

## 2024-07-30 NOTE — Progress Notes (Signed)
 OT Cancellation Note  Patient Details Name: Katie Waller MRN: [id number] DOB: 10/20/1945   Cancelled Treatment:    Reason Eval/Treat Not Completed: Patient at procedure or test/ unavailable Currently undergoing in room HD. Hope to check back later for OT session as schedule allows.  Mliss Fish 07/30/2024, 10:02 AM

## 2024-07-30 NOTE — Progress Notes (Signed)
 eLink Physician-Brief Progress Note Patient Name: INDY PRESTWOOD DOB: 07-Nov-1945 MRN: [id number]   Date of Service  07/30/2024  HPI/Events of Note  Hemoglobin 6.3 gm / dl, no signs of active bleeding.  eICU Interventions  One unit PRBC ordered transfused.        Kately Graffam U [place] Seto 07/30/2024, 5:21 AM

## 2024-07-30 NOTE — Progress Notes (Signed)
 PHARMACY - ANTICOAGULATION CONSULT NOTE  Pharmacy Consult for heparin  Indication: Afib and IJ thrombus  Labs: Recent Labs    07/28/24 0500 07/28/24 1700 07/29/24 0434 07/29/24 1654 07/30/24 0446 07/30/24 2231  HGB 7.3*  --  7.0*  --  6.3*  --   HCT 21.4*  --  20.8*  --  18.3*  --   PLT 352  --  403*  --  445*  --   APTT 76*   < > 71* 77* 69*  --   HEPARINUNFRC  --   --   --   --   --  <0.10*  CREATININE 2.04*  --  1.43*  --  2.37*  --    < > = values in this interval not displayed.   Assessment: 77yo female subtherapeutic on heparin  with initial transition from bival, was started on heparin  initially during this admission but had very low requirements, was started at same low rate today; no infusion issues or signs of bleeding per RN.  Goal of Therapy:  Heparin  level 0.3-0.7 units/ml   Plan:  Increase heparin  infusion by 3 units/kg/hr to 600 units/hr. Check level in 8 hours.   Marvetta Dauphin, PharmD, BCPS 07/30/2024 11:41 PM

## 2024-07-30 NOTE — Progress Notes (Addendum)
 PHARMACY - ANTICOAGULATION CONSULT NOTE  Pharmacy Consult:  Bivalirudin  >> Heparin   Indication: atrial fibrillation and internal jugular thombus   Allergies  Allergen Reactions   Ace Inhibitors Swelling   Diphenhydramine Other (See Comments)    Hyperactive/jittery    Doxycycline Hyclate Nausea And Vomiting   Codeine Nausea And Vomiting    Patient Measurements: Height: 5' 1 (154.9 cm) Weight: 69.6 kg (153 lb 7 oz) IBW/kg (Calculated) : 47.8 HEPARIN  DW (KG): 63.1  Vital Signs: Temp: 99.7 F (37.6 C) (11/25 0344) Temp Source: Axillary (11/25 0344) BP: 104/46 (11/25 0630) Pulse Rate: 87 (11/25 0630)  Labs: Recent Labs    07/28/24 0500 07/28/24 1700 07/29/24 0434 07/29/24 1654 07/30/24 0446  HGB 7.3*  --  7.0*  --  6.3*  HCT 21.4*  --  20.8*  --  18.3*  PLT 352  --  403*  --  445*  APTT 76*   < > 71* 77* 69*  CREATININE 2.04*  --  1.43*  --  2.37*   < > = values in this interval not displayed.    Estimated Creatinine Clearance: 18.3 mL/min (A) (by C-G formula based on SCr of 2.37 mg/dL (H)).  Assessment: 26 YOF with recurrent Afib to resume bivalirudin .  She was previously on IV heparin  and was switched to bivalirudin  due to labile heparin  levels.  Med was stopped because it was thought that Afib was secondary to critical illness, which resolved. However patient went back into Afib w/ RVR on 11/7 and bivalirudin  has been resumed.  11/25: In discussion with CCM, will trial switching back to heparin  infusion to facilitate discharge. Previously, patient was supratherapeutic at typical starting doses of heparin . She had therapeutic levels ~ 400 unit/hr of heparin . Will resume at this rate and titrate as needed.  Goal of Therapy:  Heparin  level 0.3-0.7 units/ml Monitor platelets by anticoagulation protocol: Yes   Plan:  Stop Bivalirudin   Start heparin  infusion at 400 units/hr  Heparin  level in 8 hours and daily  Daily CBC  Thank you for allowing pharmacy to  participate in this patient's care.  Rankin Sams, PharmD, BCPS, BCCCP Clinical Pharmacist

## 2024-07-30 NOTE — Progress Notes (Signed)
 Nutrition Follow-up  DOCUMENTATION CODES:  Not applicable  INTERVENTION:  TPN via PICC: Nutrition recommendations are below Management per pharmacy team Wean as able pending enteral feeding tolerance Trial trickle TF today. When able to advance, goal recommendations are below: [employer] 1.4 RTH at 50 ml/h (1200 ml per day) Prosource TF20 60 ml BID Provides 1840 kcal, 114 gm protein, 864 ml free water  daily Of note, cortrak tube is not post-pyloric at this time, will need to be advanced if this is desired. Monitor goals of care discussions 1 packet Juven BID to support wound healing once TF are tolerated and advancing to goal   NUTRITION DIAGNOSIS:  Inadequate oral intake related to acute illness as evidenced by NPO status. Ongoing.   GOAL:  Patient will meet greater than or equal to 90% of their needs Progressing, TPN and tube feeds meeting needs  MONITOR:  Vent status, Labs, Weight trends, TF tolerance  REASON FOR ASSESSMENT:  Consult  (CRRT)  ASSESSMENT:  Pt admitted with AMS, sepsis, ARF. 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  10/21 - admitted d/t PNA, HMPV infection; intubated; stroke/sz ruled out 10/23 - failed SBT, TF initiated 10/24 - extubated 10/25 - re-intubated 11/2 - extubated, no access for TF 11/03 - advanced to clear liquid diet 11/04 - advanced to full liquid diet 11/05 - transfer out of ICU; photo of sacral wound; doc as DTI 11/07 - Cortrak placed, TF started 11/08 - hypoxic; transfer to ICU (4N) 11/09 - trach placed; echo: dilated IVC and RA pressures 11/12 - FMS inserted, only small amount of stool in tubing; noted sacral wound documented as stage II 11/13 - transferred to 23M, TF off for vomiting 11/15 - TF restarted at trickle 11/16 - CRRT held 11/17 - NGT to suction, TPN initiated at 1/2 rate  Pt resting in bed at the time of assessment.  Pt discussed during ICU rounds. Bowel regimen and pain regimen adjusted and pt had BM. Starting trickle feeds today and weaning TPN to 1/2 rate. Authorization has been started for [facility]. Pt stable to discharge once received. If pt tolerates TF, will continue to titrate to goal.  Per Pharmacy: Goal concentrated TPN rate is 60 mL/hr to provide 1738 kCal and 115g AA per day   MV: 9.4 L/min Temp (24hrs), Avg:98.4 F (36.9 C), Min:96.6 F (35.9 C), Max:99.7 F (37.6 C) MAP (art line): 55-74 mmHg this AM  Admit weight: 71 kg  Current weight: 69.6 kg Pitting edema noted to all extremities   Intake/Output Summary (Last 24 hours) at 07/30/2024 1503 Last data filed at 07/30/2024 1223 Gross per 24 hour  Intake 2529.83 ml  Output 2650 ml  Net -120.17 ml  Net IO Since Admission: -191.88 mL [07/30/24 1503]  Drains/Lines: PICC double lumen Temporary HD Catheter, left IJ Chest tube, right, 50mL x 24 hours NGT, right nare, out x 24 hours Trach #6 shiley cuffed Cortrak, left nare (gastric)  Nutritionally Relevant Medications: Scheduled Meds:  bisacodyl   10 mg Rectal Daily   insulin  aspart  0-15 Units Subcutaneous Q4H   pantoprazole  IV  40 mg Intravenous Q24H   polyethylene glycol  17 g Per Tube BID   revefenacin   175 mcg Nebulization Daily   senna  2 tablet Per Tube Daily   Continuous Infusions:  TPN ADULT (ION) 60 mL/hr at 07/30/24 0900   TPN ADULT (ION)     PRN Meds: ondansetron ,  polyethylene glycol, simethicone , SMOG  Labs Reviewed: BUN 118, creatinine 2.37 Phosphorus 5.8 CBG ranges from 137-197 mg/dL over the last 24 hours HgbA1c 5.6%  NUTRITION - FOCUSED PHYSICAL EXAM: Flowsheet Row Most Recent Value  Orbital Region No depletion  Upper Arm Region No depletion  [L arm edema]  Thoracic and Lumbar Region No depletion  Buccal Region No depletion  Temple Region No depletion  Clavicle Bone Region Mild depletion  Clavicle and Acromion Bone Region Moderate depletion   Scapular Bone Region Moderate depletion  Dorsal Hand Moderate depletion  Patellar Region Unable to assess  [edema]  Anterior Thigh Region Unable to assess  [edema]  Posterior Calf Region Unable to assess  [edema]  Edema (RD Assessment) Moderate  Hair Reviewed  Eyes Reviewed  Mouth Unable to assess  Skin Reviewed  Nails Reviewed    Diet Order:   Diet Order             Diet NPO time specified  Diet effective now                   EDUCATION NEEDS:  Not appropriate for education at this time  Skin: Skin Integrity Issues: DTI: L heel (3.3 cm x 3 cm), right heel, sacrum (12 cm x 12 cm) Skin Tear: perineum, medial (0.75 cm) MASD: medial coccyx  11/20 WOC Note: R/L heel/feet: deep purple maroon discoloration ascending from toes to mid ankle (felt to be related to ischemia, no pulse, vascular now following, likely needs AKAs) L posterior leg: light pink dry 11/24 WOC Note: DTI to Sacrum, bilateral buttocks and bilateral thighs Measurement: sacrum 18 cm x 15 cm, friable areas peri-anal area. Thighs aprox. 3 cm x 3.5 cm each.  Last BM:  11/24 - type 7  Height:  Ht Readings from Last 1 Encounters:  07/26/24 5' 1 (1.549 m)    Weight:  Wt Readings from Last 1 Encounters:  07/30/24 69.6 kg    Ideal Body Weight:  47.7 kg  BMI:  Body mass index is 28.99 kg/m.  Estimated Nutritional Needs:  Kcal:  1700-1900 Protein:  110-125 grams Fluid:  >/=1.5L   Vernell Lukes, RD, LDN, CNSC Registered Dietitian II Please reach out via secure chat

## 2024-07-30 NOTE — Progress Notes (Signed)
 .  Katie Waller, MRN:  [id number], DOB:  27-Aug-1946, LOS: 35 ADMISSION DATE:  06/25/2024, CONSULTATION DATE:  06/25/2024 REFERRING MD:  Theodoro SAUNDERS, CHIEF COMPLAINT:  AMS, Sepsis, AR   History of Present Illness:  Katie Waller is a 78 yo female with past medical history significant for HTN, COPD, acute on chronic hypoxic, hypercapnic respiratory failure on 5-6L [place] baseline, diastolic heart failure, SCC Left lower lobe s/p radiation in September 2025, who presented via EMS after being found unresponsive at home. Code stroke called. On arrival to ED patient emergently intubated for acute respiratory distress and airway protection. CT Head with no ICH or large infarct, CTA head and neck with no LVO. CXR concerning for a Pneumonia with visible BL lung apices on CTA demonstrating infection/inflammation. Code stroke cancelled with presentation suspicious for sepsis. Patient received 30ml/kg crystalloid, pan cultured, and started empirically on Cefepime /Vanc/Azithromycin . Of note, family reports patient has recently not felt well and likely has had a cold. PCCM consulted for ICU admission.   On exam in ED, patient febrile to >101 and hypotensive, likely multifactorial in setting of presumed sepsis and recent sedation on induction with underlying heart disease. Norepinephrine  gtt was initiated for MAPs < 65 and patient was transferred to ICU with plans to gain central access for vasopressor administration and arterial line placement for hemodynamic monitoring.  Pertinent  Medical History   Past Medical History:  Diagnosis Date   Abnormal finding on EKG 09/19/2013   Acute on chronic respiratory failure with hypercapnia (HCC)    Acute respiratory failure (HCC) 06/08/2016   Anxiety 11/09/2020   Arthritis    Asthma    Carotid bruit 11/09/2020   Chronic diastolic CHF (congestive heart failure) (HCC) 07/28/2017   Chronic obstructive pulmonary disease, unspecified (HCC) 11/09/2020   COPD, group  D, by GOLD 2017 classification (HCC) 09/17/2013   Cough 11/09/2020   Decreased estrogen level 11/09/2020   Diabetes (HCC)    Dyspnea    Edema 11/09/2020   Essential hypertension 09/18/2013   Goals of care, counseling/discussion    Hardening of the aorta (main artery of the heart) 11/09/2020   Heart disease    History of radiation therapy    Left Lung- 06/20/23-06/26/23- Dr. 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   Transient ischemic attack 11/09/2020   Venous stasis of both lower extremities 06/13/2016     Significant Hospital Events: Including procedures, antibiotic start and stop dates in addition to other pertinent events   10/21 Found unresponsive at home->ED>Code stroke>intubated for ARD/airway protection>CTH/CTA negative>Code stroke cancelled>Hypotensive>started on pressors>Admit to ICU  Arterial line placed 10/21 10/22 and 10/23 unsuccessful extubation 10/24: Despite maximal Precedex  and fentanyl  pushes, unable to safely and comfortably clean the patient up so added prn Versed  and started fentanyl  infusion. Rectal tube added for diarrhea.  10/24: 2/3 BC resulted in staph capitis/epidermitis, extubated on BiPAP 10/25: patient remained on BiPAP for 24 hours, when she came off of BiPAP, could not tolerate became tachypneic, tachycardic went into respiratory distress requiring endotracheal reintubation 10/29: unsuccessful SBT,  went briefly back into RVR 10/30: unsuccessful weaning from sedation as BP/HR increased  considerably 11/2: extubated to HFNC 11/8: progressively became more hypoxic with decreased respiratory drive prompting ABG this a.m. which revealed toxic hypercapnic respiratory failure.  Transferred back to ICU for impending intubation; tried draining pleural spaces but still tired out 11/10: trach 11/10: aggressive diuresis with Lasix  80 followed by 160 followed by 260 + metolazone  and bicarb - no response>Nephrology consulted 11/11: nephro consult. LE weakness noted on exam with inability to move - neuro consulted, MRI C and T spine ordered. CT pulled yesterday, worsening ptx>2 R CT placed, ptx improved. CRRT started. Hypotensive>briefly on Levo 11/12: Levo stopped. MRI C spine>cervical stenosis.  11/13 Off TF.  11/14: Worsening hypotension in setting of tension pneumothorax. Resolved after chest tube was flushed with improved pressor requirement. Levophed  now 5 mcg/min 11/15 back on TF 11/16 started zosyn  for leukocytosis 11/17 TF held. Started on Ketamine . Received 1 pRBC 11/18 Art line came out  11/19 ABI ordered > undetectable doppler waveforms bilateral PTA/DPA > 11/20 CTA > Not a candidate for aortobifemoral bypass versus ax bifemoral 11/20: tolerated iHD without vasopressor infusion. This was on midodrine  10 mg Q8H. Vascular advised limited management options at this time (palliative AKA as tissue loss progresses). 11/21: called [facility] to request transfer to their facility at family's request for a second opinion. MD-to-MD conversation was conducted. Their intensivist then spoke to their vascular surgeon who advised that on their review the recommendations provided by our vascular surgery team were appropriate and transfer would be declined due to no higher level of care being required / services being available at current facility. This was conveyed to the family in real-time and there were no clarifying questions from family who were appreciative of the secondary  confirmation this provided. 11/22: Did well off fentanyl  without any boluses throughout the day. 11/23 Ketamine  stopped  11/24 Given lactulose  suppository, SMOG enema and started on Naltrexone  11/25 BM, will start with trickle feeds today   Interim History / Subjective:  O/N events: Hgb 6.3, 1 pRBC   Had a BM this morning, no other concerns.   Objective    Blood pressure (!) 104/46, pulse 87, temperature 99.7 F (37.6 C), temperature source Axillary, resp. rate (!) 25, height 5' 1 (1.549 m), weight 69.6 kg, SpO2 100%.    Vent Mode: PRVC FiO2 (%):  [40 %] 40 % Set Rate:  [25 bmp] 25 bmp Vt Set:  [380 mL] 380 mL PEEP:  [5 cmH20] 5 cmH20 Plateau Pressure:  [18 cmH20-23 cmH20] 21 cmH20   Intake/Output Summary (Last 24 hours) at 07/30/2024 0658 Last data filed at 07/30/2024 0600 Gross per 24 hour  Intake 2155.02 ml  Output 450 ml  Net 1705.02 ml   Filed Weights   07/28/24 1805 07/29/24 0500 07/30/24 0455  Weight: 67.3 kg 71.1 kg 69.6 kg    Examination: General: ill appearing  HENT: NGT with bilious output ~400 cc   Neck: Trach in place  Lungs: on MV, with expiratory wheeze  Cardiovascular: irregular  Abdomen: distended but soft, +BS  Extremities: Cold bilateral feet with darkening skin changes, no dp and pT pulses palpated.  Neuro: Awakens, does not follow commands  GU: No foley   UOP 0   Chest tube: ~50 NGT: ~400 mL  I/O 450 mL   Pertinent Labs: WBC 18.8 (21.6), Hgb 6.3 (7), PLT 445 (403) Na 140, Scr 2.37 (1.43), Phos 5.8, AG 20 with Bicarb 20 ,  Mag 2.3   Resolved problem list  Acute metabolic encephalopathy  Human metapneumovirus pneumonia CRRT-related hypotension Hypokalemia Hypomagnesemia Hypoglycemia    Assessment and Plan   Acute on chronic hypoxic & hypercapnic respiratory failure s/p trach 11/9 Fluid overload - baseline weight per daughter around 146-150 lbs BL effusions: s/p thoracentesis 11/8 with 600 cc transudative fluid Right PTX s/p chest  tube: #1 R pigtail CT pulled 11/10; #2 R CT remained; immediate recurring ptx>R pigtail re-placed>both CT to - . 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 There is hyperpigmentation/darkening of the skin dorsal and ventral surfaces of the feet (images on the chart).  - US  ABI/TBI with undetectable doppler waveforms bilateral PTA/DPA.  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 -Appreciate NS's management; no plans for intervention of  cervical stenosis given overall condition -PT, OT as patient tolerates    AOC anemia  Anemia of chronic kidney/chronic disease   S/p 1 pRBC 11/17, s/p 1 pRBC 11/19,  - Hgb 6.3 s/p 1 pRBC this AM   - Transfuse if Hgb < 7   Anuric AKI Likely 2/2 sepsis/pneumonia and the use of antibiotics  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], 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, 11/20, 11/22, 11/23] - Nephrology following, iHD next on WF, then MWF  - PRN Midodrine  10 mg TID, can increase it to Midodrine  15 TID if needed for BP support  - Bicarb gtt stopped - avoid nephrotoxins - strict I/O - renally dose meds, avoid nephrotoxic meds   Ileus - Improving  BM 11/25, with active BS today  Bowel regimen:  - Dulcolax supposiory daily, Miralax  BID, sennaKot BID  - PRN Miralax  daily  - PRN Mylicon Q8H   - PRN SMOG enema  NGT to suction  Wean down Naltrexone    Sacrum wound and bilateral buttock, bilateral thigh, DTI  - Wound care following, recs:  - Continue applying Santyl  to bilat buttocks and sacrum  Q day, then cover with moist gauze and sacrum foam dressing.   - Change foam dressing Q 3 days or PRN soiling  DVT PPX: Heparin   GI PPX: Protonix   Code Status: Full scope of care as of today; insurance autho pending LTACH   Labs   CBC: Recent Labs  Lab 07/26/24 0409 07/27/24 0424 07/28/24 0500 07/29/24 0434 07/30/24 0446  WBC 17.5* 20.3* 21.5* 21.6* 18.8*  HGB 7.3* 7.0* 7.3* 7.0* 6.3*  HCT 21.8* 20.9* 21.4* 20.8* 18.3*  MCV 89.7 88.2 87.3 87.8 87.1  PLT 175 245 352 403* 445*    Basic Metabolic Panel: Recent Labs  Lab 07/26/24 0722 07/27/24 0424 07/28/24 0500 07/29/24 0434 07/30/24 0446  NA 133* 132* 137 137 140  K 3.3* 3.4* 4.1 3.5 4.1  CL 94* 85* 98 94* 100  CO2 23 18* 22 26 20*  GLUCOSE 158* 278* 189* 159* 214*  BUN 66* 93* 81* 63* 118*  CREATININE 1.98* 2.83* 2.04* 1.43* 2.37*  CALCIUM  8.1* 7.6* 8.5* 8.9 8.9  MG 1.7 2.2 2.1 2.0 2.3  PHOS 1.9* 4.2 4.3 3.0 5.8*   GFR: Estimated Creatinine Clearance:  18.3 mL/min (A) (by C-G formula based on SCr of 2.37 mg/dL (H)). Recent Labs  Lab 07/27/24 0424 07/28/24 0500 07/29/24 0434 07/30/24 0446  WBC 20.3* 21.5* 21.6* 18.8*    Liver Function Tests: Recent Labs  Lab 07/25/24 0421 07/26/24 0409 07/27/24 0424 07/28/24 0500 07/29/24 0434 07/30/24 0446  AST 136*  --   --   --  46*  --   ALT 337*  --   --   --  94*  --   ALKPHOS 146*  --   --   --  136*  --   BILITOT 0.7  --   --   --  0.6  --   PROT 5.3*  --   --   --  6.2*  --   ALBUMIN  <1.5* 1.6* <1.5* 1.8* 2.2* 1.7*   No results for input(s): LIPASE, AMYLASE in the last 168 hours. No results for input(s): AMMONIA in the last 168 hours.  ABG    Component Value Date/Time   PHART 7.370 07/24/2024 0456   PCO2ART 53.3 (H) 07/24/2024 0456   PO2ART 107 07/24/2024 0456   HCO3 30.9 (H) 07/24/2024 0456   TCO2 32 07/24/2024 0456   ACIDBASEDEF 2.0 07/19/2024 2016   O2SAT 98 07/24/2024 0456     Coagulation Profile: No results for input(s): INR, PROTIME in the last 168 hours.  Cardiac Enzymes: No results for input(s): CKTOTAL, CKMB, CKMBINDEX, TROPONINI in the last 168 hours.  HbA1C: Hgb A1c MFr Bld  Date/Time Value Ref Range Status  06/26/2024 04:47 AM 5.6 4.8 - 5.6 % Final    Comment:    (NOTE) Diagnosis of Diabetes The following HbA1c ranges recommended by the American Diabetes Association (ADA) may be used as an aid in the diagnosis of diabetes mellitus.  Hemoglobin             Suggested A1C NGSP%              Diagnosis  <5.7                   Non Diabetic  5.7-6.4                Pre-Diabetic  >6.4                   Diabetic  <7.0  Glycemic control for                       adults with diabetes.    09/17/2013 01:57 PM 6.1 (H) <5.7 % Final    Comment:    (NOTE)                                                                       According to the ADA Clinical Practice Recommendations for 2011, when HbA1c is used as a  screening test:  >=6.5%   Diagnostic of Diabetes Mellitus           (if abnormal result is confirmed) 5.7-6.4%   Increased risk of developing Diabetes Mellitus References:Diagnosis and Classification of Diabetes Mellitus,Diabetes Care,2011,34(Suppl 1):S62-S69 and Standards of Medical Care in         Diabetes - 2011,Diabetes Care,2011,34 (Suppl 1):S11-S61.    CBG: Recent Labs  Lab 07/29/24 1133 07/29/24 1526 07/29/24 1939 07/29/24 2333 07/30/24 0342  GLUCAP 157* 167* 168* 188* 175*    Review of Systems:   As above   Past Medical History:  She,  has a past medical history of Abnormal finding on EKG (09/19/2013), Acute on chronic respiratory failure with hypercapnia (HCC), Acute respiratory failure (HCC) (06/08/2016), Anxiety (11/09/2020), Arthritis, Asthma, Carotid bruit (11/09/2020), Chronic diastolic CHF (congestive heart failure) (HCC) (07/28/2017), Chronic obstructive pulmonary disease, unspecified (HCC) (11/09/2020), COPD, group D, by GOLD 2017 classification (HCC) (09/17/2013), Cough (11/09/2020), Decreased estrogen level (11/09/2020), Diabetes (HCC), Dyspnea, Edema (11/09/2020), Essential hypertension (09/18/2013), Goals of care, counseling/discussion, Hardening of the aorta (main artery of the heart) (11/09/2020), Heart disease, History of radiation therapy, Hypertension, Hypertensive heart failure (HCC) (11/09/2020), Hypoxia (11/09/2020), Insomnia (11/09/2020), Iron deficiency anemia (11/09/2020), Large liver (11/09/2020), Near syncope (09/17/2013), Osteoporosis (11/09/2020), Palliative care encounter, Prediabetes (09/19/2013), Pure hypercholesterolemia (11/09/2020), Raynaud's disease (11/09/2020), Sinus tachycardia (09/18/2013), Skin sensation disturbance (11/09/2020), Smoker (09/17/2013), 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   Surgical History:   Past Surgical History:   Procedure Laterality Date   BRONCHIAL BIOPSY  05/25/2023   Procedure: BRONCHIAL BIOPSIES;  Surgeon: Brenna Adine CROME, DO;  Location: [facility];  Service: Pulmonary;;   BRONCHIAL NEEDLE ASPIRATION BIOPSY  05/25/2023   Procedure: BRONCHIAL NEEDLE ASPIRATION BIOPSIES;  Surgeon: Brenna Adine CROME, DO;  Location: [facility];  Service: Pulmonary;;   FIDUCIAL MARKER PLACEMENT  05/25/2023   Procedure: FIDUCIAL MARKER PLACEMENT;  Surgeon: Brenna Adine CROME, DO;  Location: [facility];  Service: Pulmonary;;   KNEE SURGERY Left      Social History:   reports that she quit smoking about 7 years ago. Her smoking use included cigarettes. She started smoking about 7 years ago. She has a 55 pack-year smoking history. She has never used smokeless tobacco. She reports that she does not drink alcohol and does not use drugs.   Family History:  Her family history includes Hyperlipidemia in her father and mother; Hypertension in her father and mother.   Allergies Allergies  Allergen Reactions   Ace Inhibitors Swelling   Diphenhydramine Other (See Comments)    Hyperactive/jittery    Doxycycline Hyclate Nausea And Vomiting   Codeine Nausea And Vomiting  Home Medications  Prior to Admission medications   Medication Sig Start Date End Date Taking? Authorizing Provider  acetaminophen  (TYLENOL ) 500 MG tablet Take 1,000 mg by mouth every 6 (six) hours as needed for moderate pain.   Yes [provider]  albuterol  (PROVENTIL  HFA;VENTOLIN  HFA) 108 (90 BASE) MCG/ACT inhaler Inhale 2 puffs into the lungs every 6 (six) hours as needed for wheezing or shortness of breath. 09/19/13  Yes Johnson, Clanford L, MD  aspirin  81 MG chewable tablet Chew 1 tablet (81 mg total) by mouth daily. 09/19/13  Yes Johnson, Clanford L, MD  atorvastatin  (LIPITOR) 10 MG tablet TAKE 1 TABLET BY MOUTH EVERY DAY 07/21/23  Yes Krasowski, Robert J, MD  benzonatate  (TESSALON ) 100 MG capsule Take 100 mg by mouth 3 (three) times daily  as needed for cough.    Yes [provider]  Biotin 10 MG CAPS Take 10 mg by mouth daily.   Yes [provider]  budesonide -formoterol  (SYMBICORT ) 160-4.5 MCG/ACT inhaler Inhale 2 puffs into the lungs 2 (two) times daily. 08/23/19  Yes Mannam, Praveen, MD  calcium  carbonate (OSCAL) 1500 (600 Ca) MG TABS tablet Take 600 mg of elemental calcium  by mouth daily.   Yes [provider]  Cholecalciferol (VITAMIN D ) 50 MCG (2000 UT) tablet Take 2,000 Units by mouth daily.   Yes [provider]  diphenhydramine-acetaminophen  (TYLENOL  PM) 25-500 MG TABS tablet Take 1 tablet by mouth at bedtime.   Yes [provider]  furosemide  (LASIX ) 40 MG tablet Take 40 mg by mouth daily.   Yes [provider]  gabapentin  (NEURONTIN ) 300 MG capsule Take 900 mg by mouth at bedtime.   Yes [provider]  iron polysaccharides (NIFEREX) 150 MG capsule Take 150 mg by mouth daily. 11/05/20  Yes [provider]  loratadine (CLARITIN) 10 MG tablet Take 10 mg by mouth daily as needed for allergies.   Yes [provider]  LORazepam  (ATIVAN ) 1 MG tablet Take 1 tablet (1 mg total) by mouth every 6 (six) hours as needed for anxiety. 08/07/17  Yes Drusilla Sabas RAMAN, MD  losartan  (COZAAR ) 50 MG tablet Take 50 mg by mouth daily.   Yes [provider]  Multiple Vitamin (MULTIVITAMIN WITH MINERALS) TABS tablet Take 1 tablet by mouth daily.   Yes [provider]  raloxifene  (EVISTA ) 60 MG tablet Take 60 mg by mouth every morning. 08/07/20  Yes [provider]  Tiotropium Bromide  Monohydrate (SPIRIVA  RESPIMAT) 2.5 MCG/ACT AERS Inhale 1 puff into the lungs daily.   Yes [provider]  traZODone  (DESYREL ) 100 MG tablet Take 1 tablet (100 mg total) by mouth at bedtime as needed for sleep. 08/07/17  Yes Drusilla Sabas RAMAN, MD     Critical care time:

## 2024-07-30 NOTE — Progress Notes (Addendum)
 eLink Physician-Brief Progress Note Patient Name: Katie Waller DOB: 01-May-1946 MRN: [id number]   Date of Service  07/30/2024  HPI/Events of Note  Abdomen reported to appear distended.  Pt has had 2 recorded bowel movements today, BSRN reports that pt has bowel sounds on exam.  NGT is clamped.   eICU Interventions  Check KUB.  OK to continue trickle tube feeds in the meantime.          Jacquelyn Shadrick M DELA CRUZ 07/30/2024, 8:12 PM  8:55 PM KUB resulted. Non specific bowel gas pattern, although stomach with some gaseous distention.  Will unclamp NGT temporarily.  Continue tube feeding . Pt also reported to have had a large watery BM now as well.  Will place flexiseal as she has unstagable sacral wounds.  9:52 PM After tunring for nursing care (BM, insertion of flexiseal, etc), BP has downtrended, SBP now down to 50s.  She did get opiates during this time.  I/O indicates pt net negative 4.2 L since admission Will give 500ml LR bolus for now.  Will follow response. If pt remains hypotensive, not responsive to fluids, will plan to initiate vasopressor support.   11:42 PM Levophed  had been started earlier this evening, now running at 40mcg/kg/min.  Unclear why pt remains persistently hypotensive.  Will give another bolus of LR, will add fixed dose vasopressin  and see if these

## 2024-07-30 NOTE — Progress Notes (Signed)
 2.2 liters ultrafiltration, post hemodialysis condition critical but stable, and report was given to the Primary RN.

## 2024-07-30 NOTE — TOC Progression Note (Signed)
 Transition of Care [facility]) - Progression Note    Patient Details  Name: Katie Waller MRN: [id number] Date of Birth: Jan 04, 1946  Transition of Care [facility]) CM/SW Contact  Lauraine FORBES Saa, LCSWA Phone Number: 07/30/2024, 12:32 PM  Clinical Narrative:     12:32 PM [facility] Liaison informed medical team that insurance authorization for [facility] has been submitted and is currently pending. TOC will continue to follow.  Expected Discharge Plan: Long Term Acute Care (LTAC) Barriers to Discharge: Continued Medical Work up, [employer] and Services In-house Referral: Clinical Social Work Discharge Planning Services: [employer] Consult Post Acute Care Choice: Long Term Acute Care (LTAC) Living arrangements for the past 2 months: Single Family Home                                       Social Drivers of Health (SDOH) Interventions SDOH Screenings   Food Insecurity: No Food Insecurity (07/04/2024)  Housing: Low Risk  (07/04/2024)  Transportation Needs: No Transportation Needs (07/04/2024)  Utilities: Not At Risk (07/04/2024)  Depression (PHQ2-9): Low Risk  (06/05/2023)  Social Connections: Patient Unable To Answer (07/09/2024)  Tobacco Use: Medium Risk (06/25/2024)    Readmission Risk Interventions     No data to display

## 2024-07-30 NOTE — Progress Notes (Signed)
 PHARMACY - TOTAL PARENTERAL NUTRITION CONSULT NOTE  Indication: Ileus, Intolerance to EN  Patient Measurements: Height: 5' 1 (154.9 cm) Weight: 69.6 kg (153 lb 7 oz) IBW/kg (Calculated) : 47.8 TPN AdjBW (KG): 53.6 Body mass index is 28.99 kg/m.  Assessment:  74 YOF presented on 10/21 with PNA/COPD exacerbation, hospitalization complicated by ARF requiring CRRT, PTX, cervical stenosis/paralysis and Afib RVR.  Patient initially tolerated TF and was having diarrhea requiring fiber.  Ileus developed around 11/12 and TF/fiber were held, started on Reglan , Relistor  and neostigmine .  Trickle TF resumed on 11/15 and then held 11/17 given significant NG output and aspiration.  Pharmacy consulted to dose TPN given prolonged ileus.  Glucose / Insulin : hx preDM, A1c 5.6% - CBGs 140-214. Used 18 units SSI in the past 24 hrs Electrolytes: K 4.1 (goal >/= 4), CoCa ~10.7, Mag 2.3 (goal >/=2), Phos 5.8  Renal: CRRT 11/11 >> 11/16, HD 11/20, again 11/22 & 11/23 then Sunday/Tuesday and Friday this week of thanksgiving then MWF - BUN up 118 Hepatic: LFTs improving AST 46, ALT 94 (not d/t TPN), tbili 0.6, albumin  2.2, TG 69 (11/24) Intake / Output; MIVF: NG 400 mL, stool 1x BM 11/24, chest tube 50 mL GI meds: PRN erythromycin  and Relistor , miralax   Lactulose  and SMOG enemas given 11/24 + neostigmine   GI Imaging: none since TPN initiation GI Surgeries / Procedures: none since TPN initiation  Central access: PICC placed 07/12/24 TPN start date: 07/22/24  Nutritional Goals: Goal concentrated TPN rate is 60 mL/hr to provide 1738 kCal and 115g AA per day  RD Estimated Needs Total Energy Estimated Needs: 1700-1900 Total Protein Estimated Needs: 110-125 grams Total Fluid Estimated Needs: >/=1.5L  Current Nutrition:  TPN  Plan:  Intensified bowel regimen in discussion with CCM. With minimal NGT output in last 12 hours, plan is to clamp NGT and start trickle tube feeds with plan to advance and half TPN  today 11/25 with possibility to stop tomorrow 08/11/24.  Continue concentrated TPN at goal rate 30 mL/hr at 1800 to provide 50% of needs Electrolytes in TPN: Na 150 mEq/L, K 18 mEq/L (= 13 mEq/day), Ca 2.5 mEq/L, Mg 4 mEq/L, Phos 5 mmol/L (= 3.6 mmol/day), Cl:Ac 2:1  Add standard MVI and trace elements to TPN (D/C BComplex) Continue moderate SSI Q4H, requirements should go down with less TPN Standard TPN labs Mon/Thurs.   Daily renal function panel and mag per nephrology / CCM   Rankin Sams, PharmD, BCPS, BCCCP Clinical Pharmacist

## 2024-07-30 NOTE — Progress Notes (Signed)
 Patient ID: Katie Waller, female   DOB: 1945/12/07, 78 y.o.   MRN: [id number] [facility] Progress Note   Assessment/ Plan:   1. Acute kidney Injury: Baseline creatinine noted to be normal and acute injury is suspected to be ATN in the setting of sepsis.  Started on renal replacement therapy for volume overload on 11/11 (previously CRRT until 11/16) and currently remains on intermittent hemodialysis while on midodrine .  On schedule for hemodialysis today (modified MWF for Thanksgiving).  Per her daughter, the goal is to pursue all life-sustaining measures at this time with the intention of possible admission to LTAC. 2.  Acute on chronic hypoxic respiratory failure: Secondary to metapneumovirus pneumonia and earlier treated with antibiotics.  Status post tracheostomy on 11/9 with ongoing management per CCM/pulmonary service. 3.  Anemia: Secondary to acute/critical illness and without overt blood loss.  Hemoglobin this morning was 6.3 and orders placed earlier by CCM for PRBC transfusion. 4.  Recurrent atrial fibrillation/ IJ thrombus: On anticoagulation with bivalirudin . 5.  Bilateral lower extremity ischemia: Vascular surgery note reviewed, she is critically ill/unstable for aortobifemoral bypass and palliative above-knee amputations would be offered if tissue loss progresses.  Subjective:   Without acute events overnight, had problems with weaning from ventilator yesterday.   Objective:   BP (!) 103/47   Pulse 87   Temp 99.7 F (37.6 C) (Axillary)   Resp (!) 25   Ht 5' 1 (1.549 m)   Wt 69.6 kg   SpO2 100%   BMI 28.99 kg/m   Intake/Output Summary (Last 24 hours) at 07/30/2024 0759 Last data filed at 07/30/2024 0600 Gross per 24 hour  Intake 2075.61 ml  Output 450 ml  Net 1625.61 ml   Weight change: -1.5 kg  Physical Exam: Gen: She is currently being set up for dialysis CVS: Pulse regular rhythm, normal rate, S1 and S2 normal Resp: Anteriorly clear to  auscultation without distinct rales or rhonchi.  Status post tracheostomy Abd: Soft, flat, nontender, bowel sounds normal Ext: Bilateral lower extremities in supportive booties.  1-2+ upper extremity pitting edema  Imaging: DG Abd 1 View Result Date: 07/29/2024 EXAM: 1 VIEW XRAY OF THE ABDOMEN 07/29/2024 11:48:00 AM COMPARISON: 07/20/2024 07/18/2001 CLINICAL HISTORY: Ileus (HCC) FINDINGS: LINES, TUBES AND DEVICES: Enteric tube in place with tip and side port overlying the expected region of the gastric lumen. Feeding tube in place with tip overlying the expected region of the gastric lumen. BOWEL: Persistent gaseous dilatation of small bowel with some colon gas present. SOFT TISSUES: Atherosclerotic plaque. No opaque urinary calculi. BONES: No acute osseous abnormality. IMPRESSION: 1. Persistent gaseous dilatation of multiple small and some large bowel suggesting ileus or partial obstruction. Electronically signed by: Luke Bun MD 07/29/2024 09:06 PM EST RP Workstation: [id number]    Labs: BMET Recent Labs  Lab 07/25/24 0421 07/26/24 0409 07/26/24 9277 07/27/24 0424 07/28/24 0500 07/29/24 0434 07/30/24 0446  NA 130* 132* 133* 132* 137 137 140  K 3.9 3.1* 3.3* 3.4* 4.1 3.5 4.1  CL 92* 86* 94* 85* 98 94* 100  CO2 22 21* 23 18* 22 26 20*  GLUCOSE 149* 266* 158* 278* 189* 159* 214*  BUN 93* 56* 66* 93* 81* 63* 118*  CREATININE 2.84* 2.03* 1.98* 2.83* 2.04* 1.43* 2.37*  CALCIUM  8.3* 7.2* 8.1* 7.6* 8.5* 8.9 8.9  PHOS 3.4 2.0* 1.9* 4.2 4.3 3.0 5.8*   CBC Recent Labs  Lab 07/27/24 0424 07/28/24 0500 07/29/24 0434 07/30/24 0446  WBC 20.3* 21.5* 21.6* 18.8*  HGB 7.0* 7.3* 7.0* 6.3*  HCT 20.9* 21.4* 20.8* 18.3*  MCV 88.2 87.3 87.8 87.1  PLT 245 352 403* 445*    Medications:     sodium chloride    Intravenous Once   sodium chloride    Intravenous Once   arformoterol   15 mcg Nebulization BID   Chlorhexidine  Gluconate Cloth  6 each Topical Daily   collagenase    Topical Daily    [START ON 08/03/2024] darbepoetin (ARANESP ) injection - DIALYSIS  60 mcg Subcutaneous Q Sat-1800   fentaNYL  (SUBLIMAZE ) injection  25-50 mcg Intravenous Once   insulin  aspart  0-15 Units Subcutaneous Q4H   lidocaine -EPINEPHrine   20 mL Intradermal Once   neostigmine   0.5 mg Subcutaneous Q6H   mouth rinse  15 mL Mouth Rinse Q2H   pantoprazole  (PROTONIX ) IV  40 mg Intravenous Q24H   polyethylene glycol  17 g Per Tube Daily   revefenacin   175 mcg Nebulization Daily   sodium chloride  flush  10 mL Intrapleural Q8H   sodium chloride  flush  10-40 mL Intracatheter Q12H    Gordy Blanch, MD 07/30/2024, 7:59 AM

## 2024-07-30 NOTE — Progress Notes (Signed)
 SLP Cancellation Note  Patient Details Name: Katie Waller MRN: [id number] DOB: 12-29-1945   Cancelled assessment:    Patient not medically ready. Pt remains on vent. Following for PMSV readiness.          Terrelle Ruffolo L. Vona, MA CCC/SLP Clinical Specialist - Acute Care SLP Acute Rehabilitation Services Office number [phone]  [name] 07/30/2024, 10:31 AM

## 2024-07-31 DIAGNOSIS — J449 Chronic obstructive pulmonary disease, unspecified: Secondary | ICD-10-CM | POA: Diagnosis not present

## 2024-07-31 DIAGNOSIS — K567 Ileus, unspecified: Secondary | ICD-10-CM | POA: Diagnosis not present

## 2024-07-31 DIAGNOSIS — J9621 Acute and chronic respiratory failure with hypoxia: Secondary | ICD-10-CM | POA: Diagnosis not present

## 2024-07-31 DIAGNOSIS — I469 Cardiac arrest, cause unspecified: Secondary | ICD-10-CM

## 2024-07-31 DIAGNOSIS — J9622 Acute and chronic respiratory failure with hypercapnia: Secondary | ICD-10-CM | POA: Diagnosis not present

## 2024-07-31 LAB — BPAM RBC
Blood Product Expiration Date: 202512192359
ISSUE DATE / TIME: 202511250839
Unit Type and Rh: 6200

## 2024-07-31 LAB — TYPE AND SCREEN
ABO/RH(D): A POS
Antibody Screen: NEGATIVE
Unit division: 0

## 2024-07-31 LAB — BASIC METABOLIC PANEL WITH GFR
Anion gap: 20 — ABNORMAL HIGH (ref 5–15)
BUN: 88 mg/dL — ABNORMAL HIGH (ref 8–23)
CO2: 16 mmol/L — ABNORMAL LOW (ref 22–32)
Calcium: 13.8 mg/dL (ref 8.9–10.3)
Chloride: 100 mmol/L (ref 98–111)
Creatinine, Ser: 2.45 mg/dL — ABNORMAL HIGH (ref 0.44–1.00)
GFR, Estimated: 20 mL/min — ABNORMAL LOW (ref >60)
Glucose, Bld: 346 mg/dL — ABNORMAL HIGH (ref 70–99)
Potassium: >7.5 mmol/L (ref 3.5–5.1)
Sodium: 136 mmol/L (ref 135–145)

## 2024-07-31 LAB — CBC
HCT: 26 % — ABNORMAL LOW (ref 36.0–46.0)
HCT: 19.2 % — ABNORMAL LOW (ref 36.0–46.0)
Hemoglobin: 8.8 g/dL — ABNORMAL LOW (ref 12.0–15.0)
Hemoglobin: 6.3 g/dL — CL (ref 12.0–15.0)
MCH: 29.6 pg (ref 26.0–34.0)
MCH: 29 pg (ref 26.0–34.0)
MCHC: 33.8 g/dL (ref 30.0–36.0)
MCHC: 32.3 g/dL (ref 30.0–36.0)
MCV: 87.5 fL (ref 80.0–100.0)
MCV: 89.7 fL (ref 80.0–100.0)
Platelets: 483 K/uL — ABNORMAL HIGH (ref 150–400)
Platelets: 378 K/uL (ref 150–400)
RBC: 2.97 MIL/uL — ABNORMAL LOW (ref 3.87–5.11)
RBC: 2.14 MIL/uL — ABNORMAL LOW (ref 3.87–5.11)
RDW: 18.2 % — ABNORMAL HIGH (ref 11.5–15.5)
RDW: 18.7 % — ABNORMAL HIGH (ref 11.5–15.5)
WBC: 6.6 K/uL (ref 4.0–10.5)
WBC: 9.4 K/uL (ref 4.0–10.5)
nRBC: 21.8 % — ABNORMAL HIGH (ref 0.0–0.2)
nRBC: 44.9 % — ABNORMAL HIGH (ref 0.0–0.2)

## 2024-07-31 LAB — GLUCOSE, CAPILLARY
Glucose-Capillary: 136 mg/dL — ABNORMAL HIGH (ref 70–99)
Glucose-Capillary: 97 mg/dL (ref 70–99)
Glucose-Capillary: 76 mg/dL (ref 70–99)

## 2024-07-31 LAB — RENAL FUNCTION PANEL
Albumin: 1.5 g/dL — ABNORMAL LOW (ref 3.5–5.0)
Anion gap: 14 (ref 5–15)
BUN: 76 mg/dL — ABNORMAL HIGH (ref 8–23)
CO2: 19 mmol/L — ABNORMAL LOW (ref 22–32)
Calcium: 7.9 mg/dL — ABNORMAL LOW (ref 8.9–10.3)
Chloride: 99 mmol/L (ref 98–111)
Creatinine, Ser: 1.86 mg/dL — ABNORMAL HIGH (ref 0.44–1.00)
GFR, Estimated: 28 mL/min — ABNORMAL LOW (ref >60)
Glucose, Bld: 214 mg/dL — ABNORMAL HIGH (ref 70–99)
Phosphorus: 4.5 mg/dL (ref 2.5–4.6)
Potassium: 4.6 mmol/L (ref 3.5–5.1)
Sodium: 132 mmol/L — ABNORMAL LOW (ref 135–145)

## 2024-07-31 LAB — HEPARIN LEVEL (UNFRACTIONATED): Heparin Unfractionated: 0.11 [IU]/mL — ABNORMAL LOW (ref 0.30–0.70)

## 2024-07-31 LAB — LACTIC ACID, PLASMA: Lactic Acid, Venous: 7.9 mmol/L (ref 0.5–1.9)

## 2024-07-31 MED ORDER — MIDODRINE HCL 5 MG PO TABS
10.0000 mg | ORAL_TABLET | Freq: Three times a day (TID) | ORAL | Status: DC
  Administered 2024-07-31: 10 mg
  Filled 2024-07-31: qty 2

## 2024-07-31 MED ORDER — ACETAMINOPHEN 325 MG PO TABS
650.0000 mg | ORAL_TABLET | Freq: Four times a day (QID) | ORAL | Status: DC | PRN
  Administered 2024-07-31: 650 mg
  Filled 2024-07-31: qty 2

## 2024-07-31 MED ORDER — NOREPINEPHRINE 16 MG/250ML-% IV SOLN
0.0000 ug/min | INTRAVENOUS | Status: DC
  Administered 2024-07-31 (×2): 40 ug/min via INTRAVENOUS
  Filled 2024-07-31 (×2): qty 250

## 2024-07-31 MED ORDER — PIPERACILLIN-TAZOBACTAM IN DEX 2-0.25 GM/50ML IV SOLN
2.2500 g | Freq: Three times a day (TID) | INTRAVENOUS | Status: DC
  Administered 2024-07-31: 2.25 g via INTRAVENOUS
  Filled 2024-07-31 (×3): qty 50

## 2024-07-31 MED ORDER — SODIUM CHLORIDE 0.9 % IV SOLN
100.0000 mg | INTRAVENOUS | Status: DC
  Administered 2024-07-31: 100 mg via INTRAVENOUS
  Filled 2024-07-31 (×2): qty 5

## 2024-07-31 MED ORDER — HYDROCORTISONE SOD SUC (PF) 100 MG IJ SOLR
100.0000 mg | Freq: Two times a day (BID) | INTRAMUSCULAR | Status: DC
  Administered 2024-07-31: 100 mg via INTRAVENOUS
  Filled 2024-07-31: qty 2

## 2024-07-31 MED ORDER — VANCOMYCIN HCL 1500 MG/300ML IV SOLN
1500.0000 mg | Freq: Once | INTRAVENOUS | Status: AC
  Administered 2024-07-31: 1500 mg via INTRAVENOUS
  Filled 2024-07-31: qty 300

## 2024-07-31 MED ORDER — PIPERACILLIN-TAZOBACTAM IN DEX 2-0.25 GM/50ML IV SOLN
2.2500 g | Freq: Three times a day (TID) | INTRAVENOUS | Status: DC
  Filled 2024-07-31: qty 50

## 2024-07-31 MED ORDER — VANCOMYCIN HCL 750 MG/150ML IV SOLN: 750.0000 mg | INTRAVENOUS | Status: DC

## 2024-07-31 MED ORDER — MIDODRINE HCL 5 MG PO TABS: 10.0000 mg | ORAL_TABLET | Freq: Three times a day (TID) | ORAL | Status: DC

## 2024-07-31 MED ORDER — VANCOMYCIN HCL 750 MG/150ML IV SOLN
750.0000 mg | INTRAVENOUS | Status: DC
  Filled 2024-07-31: qty 150

## 2024-08-05 NOTE — Progress Notes (Signed)
 PT Cancellation Note  Patient Details Name: LAURIANN MILILLO MRN: [id number] DOB: [birth year]   Cancelled Treatment:    Reason Eval/Treat Not Completed: Patient not medically ready;Medical issues which prohibited therapy;Other (comment) (RN declined therapy treatment at this time;adviced to sign off until patient is medically stable.) PT will sign off at this time. Please re-consult once pt is medically appropriate.   Isaiah DEL. Dorice Stiggers, PT, DPT  [employer] 08/26/2024, 12:59 PM

## 2024-08-05 NOTE — Progress Notes (Signed)
   05-Aug-2024 1504  Spiritual Encounters  Type of Visit Initial  Care provided to: Family  Reason for visit Patient death  OnCall Visit No   Chaplain responded to Code [employer] and patient died. Chaplain provided spiritual and emotional support to Patient's daughter and daughter's son. Chaplain remains available upon request.  Chaplain Obed Samek

## 2024-08-05 NOTE — Progress Notes (Addendum)
 At 1410 Noted pt's cardiac rhythm change on the monitor, QRS widened with HR in the 60s, pt still with adequate BP and palpable pulse, MD at bedside notified, EKG and labs ordered for electrolyte check, daughter at bedside notified of pt's rhythm change, EKG immediately obtained. At 1420 rhythm changed to asystole, no palpable pulse, code blue called, acls in progress.

## 2024-08-05 NOTE — Plan of Care (Signed)
  Problem: Pain Managment: Goal: General experience of comfort will improve and/or be controlled Outcome: Progressing   Problem: Safety: Goal: Ability to remain free from injury will improve Outcome: Progressing   Problem: Health Behavior/Discharge Planning: Goal: Ability to manage health-related needs will improve Outcome: Progressing   Problem: Metabolic: Goal: Ability to maintain appropriate glucose levels will improve Outcome: Progressing   Problem: Clinical Measurements: Goal: Signs and symptoms of infection will decrease Outcome: Progressing   Problem: Respiratory: Goal: Ability to maintain adequate ventilation will improve Outcome: Progressing

## 2024-08-05 NOTE — Progress Notes (Signed)
 Time of Death: 14:51 PM   Compassionately liberated from Ventilator.

## 2024-08-05 NOTE — Death Summary Note (Addendum)
 DEATH SUMMARY   Patient Details  Name: Katie Waller MRN: [id number] DOB: Feb 07, 1946 PCP:Sun, Vyvyan, MD  Admission/Discharge Information   Admit Date:  Jul 04, 2024  Date of Death: Date of Death: 08/09/10/02/2024  Time of Death: Time of Death: 145114:51 PM   Length of Stay: 08-15-2035   Principle Cause of death: Cardiopulmonary deterioration > PEA resulting from respiratory failure from End stage COPD  Hospital Diagnoses: Principal Problem:   Sepsis (HCC) Active Problems:   Acute on chronic respiratory failure with hypoxia (HCC)   Hypotension   Septic shock (HCC)   Acute on chronic respiratory failure with hypoxia and hypercapnia (HCC)   On mechanically assisted ventilation (HCC)   Human metapneumovirus (hMPV) pneumonia   Viral pneumonia   COPD with acute exacerbation (HCC)   CKD (chronic kidney disease) stage 2, GFR 60-89 ml/min   Hyperglycemia   AKI (acute kidney injury)   Pressure injury of skin   Pneumothorax   Ileus (HCC)   Aspiration into airway   ESRD on dialysis (HCC)   Severe protein-calorie malnutrition   Critical 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 Rectal tube added for diarrhea.  10/24: 2/3 BC resulted in staph capitis/epidermitis, extubated on BiPAP 10/25: patient remained on BiPAP for 24 hours, when she came off of BiPAP, could not tolerate became tachypneic, tachycardic went into respiratory distress  requiring endotracheal reintubation 10/29: unsuccessful SBT, went briefly back into RVR 10/30: unsuccessful weaning from sedation as BP/HR increased considerably 11/2: extubated to HFNC 11/8: progressively became more hypoxic with decreased respiratory drive prompting ABG this a.m. which revealed toxic hypercapnic respiratory failure.  Transferred back to ICU for impending intubation; tried draining pleural spaces but still tired out 11/10: trach 11/10: aggressive diuresis with Lasix  80 followed by 160 followed by 260 + metolazone  and bicarb - no response>Nephrology consulted 11/11: nephro consult. LE weakness noted on exam with inability to move - neuro consulted, MRI C and T spine ordered. CT pulled yesterday, worsening ptx>2 R CT placed, ptx improved. CRRT started. Hypotensive>briefly on Levo 11/12: Levo stopped. MRI C spine>cervical stenosis.  11/13 Off TF.  11/14: Worsening hypotension in setting of tension pneumothorax. Resolved after chest tube was flushed with improved pressor requirement. Levophed  now 5 mcg/min 11/15 back on TF 11/16 started zosyn  for leukocytosis 11/17 TF held. Started on Ketamine . Received 1 pRBC 11/18 Art line came out  11/19 ABI ordered > undetectable doppler waveforms bilateral PTA/DPA > 11/20 CTA > Not a candidate for aortobifemoral bypass versus ax bifemoral 11/20: tolerated iHD without vasopressor infusion. This was on midodrine  10 mg Q8H. Vascular advised limited management options at this time (palliative AKA as tissue loss progresses). 11/21: called [facility] to request transfer to their facility at family's request for a second opinion. MD-to-MD conversation was conducted. Their intensivist then spoke to their vascular surgeon who advised that on their review the recommendations provided by our vascular surgery team were appropriate and transfer would be declined due to no higher level of care being required / services being  available at current  facility. This was conveyed to the family in real-time and there were no clarifying questions from family who were appreciative of the secondary confirmation this provided. 11/22: Did well off fentanyl  without any boluses throughout the day. 11/23 Ketamine  stopped  11/24 Given lactulose  suppository, SMOG enema and started on Naltrexone  11/25 BM, will start with trickle feeds today  2024/08/19 Fevers > started on Micafungin , zosyn , vanc. Temp HD removal   Assessment and Plan:  Patient was a critically ill patient with multiorgan system failure with care plan as outlined below. Roughly around 1415, patient was noted to have wide QRS with HR 60s > Asystole > Code was called > ACLS interventions underway with CPR for 10 minutes > achieved ROSC; daughter at bedside decided to change code status to DNR. She eventually lost her pulse with brief PEA then asystole. She was compassionately liberated from ventilator. Family at bedside. [place] for support.   Treatment for today prior to arrest:   Fevers  Hypotension  Suspecting Shock in the setting of presumed sepsis from Temp HD site Acutely hypotensive overnight requiring fluid bolus x 2 and starting Levophed  at max dose and Vasopressin  T max 103.4 this AM Has multiple foreign body including temporary HD catheter  - Line holiday by removing temp HD cath  - Start Zosyn , Vanc and Micafungin  [broad spectrum] - Start Stress dose Solu cortef  IV BID  - Wean down vasopressors with MAP > 60 or SBP > 90  - Monitor fever curve - Give tylenol  for fever control  - Prior Abx:  Zosyn  11/16-11/23    Acute on chronic hypoxic & hypercapnic respiratory failure s/p trach 11/9 Fluid overload - baseline weight per daughter around 146-150 lbs BL effusions: s/p thoracentesis 11/8 with 600 cc transudative fluid Right PTX s/p chest tube: #1 R pigtail CT pulled 11/10; #2 R CT remained; immediate recurring ptx>R pigtail re-placed>both CT to - . 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%)  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 Aspiration of gastric secretions  - On oxycodone  5 mg Q6H per tube  - Continue PRN Fent pushes  - Trach and vent dependent  - Off ketamine  11/23  - pulmonary hygiene - Continue Brovana , Duonebs, Yupelri   - stopped ICS due to pneumonia risk  - LTVV - VAP prevention protocol - PAD protocol - currently no sedation  - daily SAT & SBT as appropriate - chest tube to wall suction  - Continue Zosyn  11/16- 11/23 - Tracheal Aspirate showed few yeast, pending    Afib with RVR, new onset, paroxysmal, rate controlled  Chronic HFpEF  HTN - Continue PO Amiodarone  200 mg per tube daily - Continue IV Heparin   - tele monitoring - monitor electrolytes and replete as needed    RIJ CVL associated blood clot - Continue IV heparin     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 There is hyperpigmentation/darkening of the skin dorsal and ventral surfaces of the feet (images on the chart).  - US  ABI/TBI with undetectable doppler waveforms bilateral PTA/DPA.  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 -Appreciate NS's management; no plans for intervention of cervical stenosis given overall condition -PT, OT as patient tolerates    AOC anemia  Anemia of chronic kidney/chronic disease   S/p 1 pRBC 11/17, s/p 1 pRBC 11/19,  - Stable   - Transfuse if Hgb < 7   Anuric AKI Likely 2/2 sepsis/pneumonia and the use of antibiotics  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], 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, 11/20, 11/22, 11/23, 11/25] Next HD on Friday, Temp HD  removal and plan for permanent HD  - Midodrine  10 TID  - avoid nephrotoxins - strict I/O - renally dose meds, avoid nephrotoxic meds   Ileus - Improving  BM 23-Aug-2024  Bowel regimen:             - Dulcolax supposiory daily, Miralax  BID, sennaKot BID             - PRN Miralax  daily             - PRN Mylicon Q8H              - PRN SMOG enema  NGT to suction    Sacrum wound and bilateral buttock, bilateral thigh, DTI  - Wound care following, recs:             - Continue applying Santyl  to bilat buttocks and sacrum Q day, then cover with moist gauze and sacrum foam dressing.   - Change foam dressing Q 3 days or PRN soiling   DVT PPX: Heparin   GI PPX: Protonix        The results of significant diagnostics from this hospitalization (including imaging, microbiology, ancillary and laboratory) are listed below for reference.   Significant Diagnostic Studies: DG Abd 1 View Result Date: 07/30/2024 EXAM: 1 VIEW XRAY OF THE ABDOMEN 07/30/2024 08:36:00 PM COMPARISON: 07/29/2024 CLINICAL HISTORY: Abdominal distention FINDINGS: LINES, TUBES AND DEVICES: Enteric tube in place with tip and side port projecting over the distal stomach. Weighted enteric tube in place with tip and side port projecting over the gastric body. Right chest tube noted. Central venous catheter in place with tip projecting over the SVC. BOWEL: Persistent diffuse gaseous distension of small and large bowel. SOFT TISSUES: Vascular calcifications. No opaque urinary calculi. BONES: No acute osseous abnormality. LUNG BASES/PLEURAL SPACES: Similar interstitial opacities at the lung bases. Small left pleural effusions. IMPRESSION: 1. Persistent diffuse gaseous distension of small and large bowel without radiographic evidence of mechanical obstruction. Electronically signed by: Oneil Devonshire MD 07/30/2024 08:52 PM EST RP Workstation: [id number]   DG Abd 1 View Result Date: 07/29/2024 EXAM: 1 VIEW XRAY OF THE ABDOMEN 07/29/2024 11:48:00 AM  COMPARISON: 07/20/2024 07/18/2001 CLINICAL HISTORY: Ileus (HCC) FINDINGS: LINES, TUBES AND DEVICES: Enteric tube in place with tip and side port overlying the expected region of the gastric lumen. Feeding tube in place with tip overlying the expected region of the gastric lumen. BOWEL: Persistent gaseous dilatation of small bowel with some colon gas present. SOFT TISSUES: Atherosclerotic plaque. No opaque urinary calculi. BONES: No acute osseous abnormality. IMPRESSION: 1. Persistent gaseous dilatation of multiple small and some large bowel suggesting ileus or partial obstruction. Electronically signed by: Luke Bun MD 07/29/2024 09:06 PM EST RP Workstation: [id number]   CT ANGIO AO+BIFEM W & OR WO CONTRAST Addendum Date: 07/25/2024 ADDENDUM REPORT: 07/25/2024 11:54 ADDENDUM: Dr Myer stretch acknowledged the verbal report given to him at 11:25 am on 07/25/2024. Electronically Signed   By: Cordella  Babcock   On: 07/25/2024 11:54   Result Date: 07/25/2024 CLINICAL DATA:  Claudication EXAM: CT ABDOMEN WITH CONTRAST TECHNIQUE: Multidetector CT imaging of the abdomen was performed using the standard protocol following bolus administration of intravenous contrast. RADIATION DOSE REDUCTION: This exam was performed according to the departmental dose-optimization program which includes automated exposure control, adjustment of the mA and/or kV according to patient size and/or use of iterative reconstruction technique. CONTRAST:  OMNIPAQUE  IOHEXOL  350 MG/ML SOLN COMPARISON:  None Available. FINDINGS: Lower chest: Bilateral small pleural effusions. Subcutaneous air in the lateral posterior chest soft tissues suggestive of recent or indwelling chest tube not visualized on the lower portions of the lungs. Hepatobiliary: No intrahepatic or extrahepatic biliary dilatation. Multiple hepatic cysts. Gallbladder grossly unremarkable. Pancreas: Grossly unremarkable Spleen: No acute abnormality Adrenals/Urinary Tract:  Enhance symmetrically, however the left kidney is atretic compared to the right likely from decreased arterial flow. Stomach/Bowel: 2 enteric tubes present in the stomach. Mild-to-moderate small bowel dilatation without definite evidence of obstruction. Small amount of free fluid within the pelvis. Vascular/Lymphatic: The aorta is patent, but heavily calcified with atherosclerotic plaque. The celiac trunk and SMA are patent without significant stenosis. Minimal ostial calcifications at the origin of the SMA and celiac artery. The right renal artery is widely patent, however 90% stenosis at the origin of the left renal artery secondary to calcifications. The IMA is patent. The common iliac arteries are occluded bilaterally with a combination of calcified and soft plaque. The right internal iliac artery partially reconstitutes. The left internal iliac artery does not significantly reconstitute. The external iliac arteries are occluded. There is reconstitution of the common femoral arteries from the epigastric arteries. The proximal SFA and profunda arteries are patent, but heavily calcified with atherosclerotic plaque and diminutive in caliber. Multifocal calcifications with high-grade stenosis throughout the SFA. Popliteal artery is patent with calcifications. Proximal anterior tibial arteries are patent, but do not continue patency to the foot. Posterior tibial arteries are patent and represent predominant inflow to both feet bilaterally. Peroneal arteries are patent bilaterally, but do not provide significant collateral flow. Musculoskeletal: Degenerative changes throughout the axial skeleton without acute osseous destruction. IMPRESSION: Occluded bilateral common iliac arteries, occluded bilateral external iliac arteries, and occluded internal left iliac artery. The occlusions likely acute on chronic with heavily calcified arterial walls and significant thrombus/soft plaque throughout the lumens of these vessels.  Reconstitution of the bilateral common femoral arteries. Multifocal short segment high-grade stenotic regions throughout the superficial femoral arteries bilaterally. Although visualized proximally, the anterior tibial arteries in both lower extremities become occluded distally. The posterior tibial arteries as the predominant inflow to both feet and although the peroneal arteries are patent bilaterally the not appear to provide significant collateralization. Electronically Signed: By: Cordella Banner On: 07/25/2024 10:53   VAS US  ABI WITH/WO TBI Result Date: 07/24/2024  LOWER EXTREMITY DOPPLER STUDY Patient Name:  Katie Waller  Date of Exam:   07/24/2024 Medical Rec #: [id number]         Accession #:    [id number] Date of Birth: [birth year]        Patient Gender: F Patient Age:   78 years Exam Location:  [facility] Procedure:      VAS US  ABI WITH/WO TBI Referring Phys: LAMAR DALES --------------------------------------------------------------------------------  Indications: Ischemic lower extremity.  Comparison Study: Previous study on 3.19.2020. Performing Technologist: Edilia Elden Appl  Examination Guidelines: A complete evaluation includes at minimum, Doppler waveform signals and systolic blood pressure reading  at the level of bilateral brachial, anterior tibial, and posterior tibial arteries, when vessel segments are accessible. Bilateral testing is considered an integral part of a complete examination. Photoelectric Plethysmograph (PPG) waveforms and toe systolic pressure readings are included as required and additional duplex testing as needed. Limited examinations for reoccurring indications may be performed as noted.  ABI Findings: +---------+------------------+-----+----------+-----------+ Right    Rt Pressure (mmHg)IndexWaveform  Comment     +---------+------------------+-----+----------+-----------+ Brachial                                  Restricted.  +---------+------------------+-----+----------+-----------+ CFA                             monophasic            +---------+------------------+-----+----------+-----------+ Popliteal                       absent                +---------+------------------+-----+----------+-----------+ PTA      0                 0.00 absent                +---------+------------------+-----+----------+-----------+ DP       0                 0.00 absent                +---------+------------------+-----+----------+-----------+ Great Toe                       Absent                +---------+------------------+-----+----------+-----------+ +---------+------------------+-----+----------+-------+ Left     Lt Pressure (mmHg)IndexWaveform  Comment +---------+------------------+-----+----------+-------+ Brachial 153                    triphasic         +---------+------------------+-----+----------+-------+ CFA                             monophasic        +---------+------------------+-----+----------+-------+ Popliteal                       monophasic        +---------+------------------+-----+----------+-------+ PTA      0                 0.00 absent            +---------+------------------+-----+----------+-------+ DP       0                 0.00 absent            +---------+------------------+-----+----------+-------+ Great Toe                       Absent            +---------+------------------+-----+----------+-------+ +-------+-----------+-----------+------------+------------+ ABI/TBIToday's ABIToday's TBIPrevious ABIPrevious TBI +-------+-----------+-----------+------------+------------+ Right  0.00       0.00                                +-------+-----------+-----------+------------+------------+ Left   0.00  0.00                                +-------+-----------+-----------+------------+------------+  No doppler waveforms  detected in the bilateral lower extremities. Waveform analysis of the the bilateral femoral and popliteal arteries obtained.  Summary: Right:  Doppler waveforms of the PTA/DPA unobtainable. Left:  Doppler waveforms of the PTA/DPA unobtainable. *See table(s) above for measurements and observations.  Electronically signed by Lonni Gaskins MD on 07/24/2024 at 3:27:08 PM.    Final    DG CHEST PORT 1 VIEW Result Date: 07/22/2024 CLINICAL DATA:  Follow-up pneumothorax. EXAM: PORTABLE CHEST 1 VIEW COMPARISON:  07/19/2024 FINDINGS: A right pleural pigtail catheter remains in place. Tracheostomy tube in satisfactory position. Left jugular double-lumen catheter with its tip in the superior vena cava. Nasogastric tube extending into the stomach. Feeding tube extending into the stomach. The previously demonstrated moderate-sized right pneumothorax is no longer seen. No residual pneumothorax demonstrated and no pneumothorax seen on the left. Clear lungs with normal vascularity. Improved right subcutaneous emphysema with no residual left subcutaneous emphysema seen on this image. Unremarkable bones. IMPRESSION: 1. Resolved right pneumothorax. 2. Improved subcutaneous emphysema. Electronically Signed   By: Elspeth Bathe M.D.   On: 07/22/2024 15:53   DG Abd 1 View Result Date: 07/20/2024 CLINICAL DATA:  Ileus. EXAM: ABDOMEN - 1 VIEW COMPARISON:  07/18/2024 FINDINGS: Nasogastric tube and Dobbhoff feeding tube both with tips over the left upper quadrant likely over the stomach. Several air-filled large and small bowel loops. Less dilatation of the air-filled small bowel loops compared to the prior exam. No free peritoneal air. Remainder the exam is unchanged. IMPRESSION: 1. Nasogastric tube and Dobbhoff feeding tube with tips over the stomach. 2. Slight interval improvement of previously seen air-filled dilated small bowel loops. Findings may be due to improving ileus. Electronically Signed   By: Toribio Agreste M.D.   On:  07/20/2024 12:45   DG Chest Port 1 View Result Date: 07/19/2024 EXAM: 1 VIEW(S) XRAY OF THE CHEST 07/19/2024 08:24:00 PM COMPARISON: 07/18/2024 CLINICAL HISTORY: Pneumothorax FINDINGS: LINES, TUBES AND DEVICES: Tracheostomy tube in place. Left IJ CVC stable in position. Right PICC in place with tip terminating over the superior cavoatrial junction. Enteric tube courses through the chest to the abdomen beyond the field-of-view. LUNGS AND PLEURA: Right chest tube in place. Suspected moderate right pneumothorax poorly visualized but possibly new from recent prior. No focal pulmonary opacity. No pleural effusion. HEART AND MEDIASTINUM: No acute abnormality of the cardiac and mediastinal silhouettes. BONES AND SOFT TISSUES: Extensive subcutaneous emphysema along the right lateral chest wall/neck, unchanged. No acute osseous abnormality. IMPRESSION: 1. Suspected moderate right pneumothorax, poorly visualized but possibly new/recurrent from recent prior. 2. Indwelling right chest tube. 3. Stable support apparatus, as above. Electronically signed by: Pinkie Pebbles MD 07/19/2024 08:32 PM EST RP Workstation: [id number]   DG Chest Port 1 View Result Date: 07/18/2024 CLINICAL DATA:  Endotracheal tube placement. EXAM: PORTABLE CHEST 1 VIEW COMPARISON:  07/18/2024 at 12:51 p.m. FINDINGS: Patient slightly rotated to the right. Tracheostomy tube is in adequate position. 2 adjacent enteric tubes course into the region of the distal esophagus and off the image as tips are not visualized. Left IJ central venous catheter unchanged. Right-sided PICC line unchanged. Right-sided chest tube unchanged. Lungs are adequately inflated without significant effusion. Subtle hazy density over the left midlung unchanged and may be due to atelectasis or infection. No definite pneumothorax visualized.  Cardiomediastinal silhouette is unremarkable. Moderate subcutaneous emphysema over the right neck and flank as well as minimally over the  left neck base. Remainder of the exam is unchanged. IMPRESSION: 1. Stable hazy density over the left midlung which may be due to atelectasis or infection. 2. Tubes and lines as described. No definite pneumothorax visualized. 3. Moderate subcutaneous emphysema over the right neck and flank as well as minimally over the left neck base. Electronically Signed   By: Toribio Agreste M.D.   On: 07/18/2024 16:17   DG Abd 1 View Result Date: 07/18/2024 CLINICAL DATA:  Feeding tube placement. EXAM: ABDOMEN - 1 VIEW COMPARISON:  Earlier same day. FINDINGS: Evidence of patient's Dobbhoff feeding tube with tip over the stomach in the lower midline abdomen. Nasogastric tube present with tip 1 cm to the right of the Dobbhoff feeding to also in the lower midline abdomen over in the distal stomach. There are several air-filled mildly dilated small bowel loops over the central abdomen measuring up to 4.4 cm diameter. No free peritoneal air. Remainder of the exam is unchanged. IMPRESSION: 1. Dobbhoff feeding tube with tip over the stomach in the lower midline abdomen. Nasogastric tube with tip 1 cm to the right of the Dobbhoff feeding tube also in the lower midline abdomen over the distal stomach. 2. Several air-filled mildly dilated small bowel loops over the central abdomen which may be due to ileus versus early/partial small bowel obstruction. Electronically Signed   By: Toribio Agreste M.D.   On: 07/18/2024 16:09   DG Abd 1 View Result Date: 07/18/2024 EXAM: 1 VIEW XRAY OF THE ABDOMEN 07/18/2024 11:35:00 AM COMPARISON: 6 days ago (07/12/2024). CLINICAL HISTORY: Emesis. FINDINGS: LINES, TUBES AND DEVICES: Distal tip of feeding tube appears to be in suspected position of distal stomach. BOWEL: Increased large and small bowel dilatation concerning for ileus or distal small bowel obstruction. SOFT TISSUES: No opaque urinary calculi. BONES: No acute osseous abnormality. IMPRESSION: 1. Increased large and small bowel dilatation,  concerning for ileus or distal small bowel obstruction. 2. Feeding tube tip projects over the distal stomach. Electronically signed by: Lynwood Seip MD 07/18/2024 01:39 PM EST RP Workstation: [id number]   DG CHEST PORT 1 VIEW Result Date: 07/18/2024 EXAM: 1 VIEW(S) XRAY OF THE CHEST 07/18/2024 12:56:00 PM COMPARISON: Yesterday. CLINICAL HISTORY: Chest tube in place. FINDINGS: LINES, TUBES AND DEVICES: Tracheostomy and feeding tubes are in good position. Left internal jugular catheter is unchanged. 1 chest tube remains. LUNGS AND PLEURA: Probable minimal right apical pneumothorax. No focal pulmonary opacity. No pleural effusion. HEART AND MEDIASTINUM: No acute abnormality of the cardiac and mediastinal silhouettes. BONES AND SOFT TISSUES: Extensive subcutaneous emphysema is seen in the right lateral chest wall and supraclavicular region which has progressed since prior exam. No acute osseous abnormality. IMPRESSION: 1. Probable minimal right apical pneumothorax. 2. Progressive extensive subcutaneous emphysema in the right lateral chest wall and supraclavicular region. Electronically signed by: Lynwood Seip MD 07/18/2024 01:36 PM EST RP Workstation: [id number]   DG Chest Port 1 View Result Date: 07/17/2024 CLINICAL DATA:  Pneumothorax. EXAM: PORTABLE CHEST 1 VIEW COMPARISON:  07/16/2024 FINDINGS: Interval decrease in size of the right apical pneumothorax with 2 left pleural drains in place. Left IJ central line tip overlies the proximal SVC level. Right PICC line tip overlies the proximal to mid SVC. Bibasilar atelectasis/infiltrate, left greater than right with small bilateral pleural effusions, similar to prior. Tracheostomy tube again noted. Telemetry leads overlie the chest. IMPRESSION: 1. Interval decrease in  size of the right apical pneumothorax with 2 left pleural drains in place. 2. Otherwise no substantial interval change. Electronically Signed   By: Camellia Candle M.D.   On: 07/17/2024 05:59   DG  Chest Port 1 View Result Date: 07/16/2024 EXAM: 1 VIEW(S) XRAY OF THE CHEST 07/16/2024 10:11:00 PM COMPARISON: Portable chest today at 8:37 pm. CLINICAL HISTORY: [id number] Chest tube in place [id number] Chest tube in place. FINDINGS: LINES, TUBES AND DEVICES: Right tube thoracostomy with the pigtail partially formed in the lateral apical area. A second pigtail chest tube has been inserted just below the entry of the first with the pigtail formed in the lateral mid to lower chest. Stable Tracheostomy positioning. Double lumen left IJ (Internal Jugular) catheter with tip in the mid to distal SVC (Superior Vena Cava), unchanged. Feeding tube in the stomach with the intragastric course of the tube out of view. LUNGS AND PLEURA: Right apical pneumothorax improved measuring 2.2 cm pleuroparenchymal separation, previously 4.7 cm. The right lung has reinflated except for the apical pneumothorax. Small left pleural effusion with overlying atelectasis or minimal consolidation. The lungs are emphysematous, otherwise clear . No pulmonary edema. No pneumothorax on the left. HEART AND MEDIASTINUM: Stable mediastinum. Stable mild cardiomegaly. The thoracic aorta was heavily calcified. BONES AND SOFT TISSUES: Interval new moderate right chest wall emphysema extending into the supraclavicular fossa. No new osseous findings. IMPRESSION: 1. Interval improvement of right pneumothorax with residual small right apical pneumothorax measuring 2.2 cm pleuroparenchymal separation, previously 4.7 cm. 2. New moderate right chest wall emphysema extending into the supraclavicular fossa. 3. Small left pleural effusion with overlying atelectasis or minimal consolidation. Electronically signed by: Francis Quam MD 07/16/2024 10:21 PM EST RP Workstation: [id number]   DG Chest Port 1 View Result Date: 07/16/2024 CLINICAL DATA:  Pneumothorax lumen. EXAM: PORTABLE CHEST 1 VIEW COMPARISON:  Chest radiograph dated 07/16/2024. FINDINGS: Significant  interval increase in the size of right pneumothorax, now measuring greater than 20%. Right-sided chest tube with tip in the upper pleural surface. Additional support apparatus as seen previously. Stable cardiac silhouette. No acute osseous pathology. IMPRESSION: Significant interval increase in the size of right pneumothorax. These results will be called to the ordering clinician or representative by the Radiologist Assistant, and communication documented in the PACS or [employer]. Electronically Signed   By: Vanetta Chou M.D.   On: 07/16/2024 20:46   DG CHEST PORT 1 VIEW Result Date: 07/16/2024 CLINICAL DATA:  Right-sided pneumothorax status post chest tube. EXAM: PORTABLE CHEST 1 VIEW COMPARISON:  Earlier radiograph dated 07/16/2024. FINDINGS: Right-sided chest tube with tip along the lateral right upper pleural surface. No significant interval change in the size of the right-sided pneumothorax. Stable positioning of the support apparatus. Stable cardiomediastinal silhouette. No acute osseous pathology. IMPRESSION: Status post right chest tube placement. No significant interval change in the size of the right-sided pneumothorax. Electronically Signed   By: Vanetta Chou M.D.   On: 07/16/2024 18:38   DG Chest Port 1 View Result Date: 07/16/2024 CLINICAL DATA:  Respiratory failure and hypoxia. EXAM: PORTABLE CHEST 1 VIEW COMPARISON:  Chest radiograph dated 07/16/2024. FINDINGS: Support apparatus in similar position. Interval increase in the size of the right pneumothorax compared to prior radiograph. There is background of emphysema. No consolidative changes. Small pleural effusions suspected. Stable cardiac silhouette. Atherosclerotic calcification of the aorta. No acute osseous pathology. IMPRESSION: Interval increase in the size of the right pneumothorax. These results will be called to the ordering clinician or representative by  the Printmaker, and communication documented in the  PACS or [employer]. Electronically Signed   By: Vanetta Chou M.D.   On: 07/16/2024 17:29   DG CHEST PORT 1 VIEW Result Date: 07/16/2024 CLINICAL DATA:  Central venous catheter placement EXAM: PORTABLE CHEST 1 VIEW COMPARISON:  07/16/2024 FINDINGS: Single frontal view of the chest demonstrates stable tracheostomy tube, right-sided PICC, and enteric catheter. New left internal jugular dialysis catheter tip overlies superior vena cava. Interval removal of the right-sided pigtail pleural drainage catheter. Cardiac silhouette is stable. Continued ectasia and atherosclerosis of the thoracic aorta. There is progressive multifocal bilateral interstitial and ground-glass opacities, left greater than right. Small left pleural effusion again noted. There is a small right apical pneumothorax, volume estimated less than 10%. No tension effect or midline shift. No acute bony abnormalities. IMPRESSION: 1. Small recurrent right apical pneumothorax after right chest tube removal, volume estimated 10%. No tension effect or midline shift. 2. Progressive bilateral interstitial and ground-glass opacities, which may reflect worsening edema or infection. 3. Small left pleural effusion, stable. 4. Support devices as above. Critical Value/emergent results were called by telephone at the time of interpretation on 07/16/2024 at 3:47pm to provider DAN [facility], who verbally acknowledged these results. Electronically Signed   By: Ozell Daring M.D.   On: 07/16/2024 15:51   MR CERVICAL SPINE W WO CONTRAST Addendum Date: 07/16/2024 ADDENDUM #1 ADDENDUM: Study discussed by telephone with Dr Claudene at 1320 hours on July 16, 2024. ---------------------------------------------------- Electronically signed by: Helayne Hurst MD 07/16/2024 01:24 PM EST RP Workstation: [id number]   Result Date: 07/16/2024 ORIGINAL REPORT  EXAM: MRI CERVICAL SPINE WITH AND WITHOUT CONTRAST 07/16/2024 12:33:23 PM TECHNIQUE: Multiplanar multisequence MRI  of the cervical spine was performed without and with the administration of 7.5 mL of gadobutrol  (GADAVIST ) 1 MMOL/ML injection. COMPARISON: CTA head and neck 06/25/2024. 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 FINDINGS: BONES AND ALIGNMENT: Normal alignment. Maintained vertebral height. Normal background bone marrow signal. Widespread degenerative cervical endplate spurring and chronic degenerative endplate marrow signal changes. No marrow edema. No abnormal enhancement. SPINAL CORD: Abnormal cervical spinal cord at C4-C5 and C5-C6 with moderate to severe degenerative spinal stenosis and degenerative spinal cord mass effect (series 4 image 21). Cord signal appears mildly heterogeneous there on both T2 and STIR imaging. Above and below that level, spinal cord volume is relatively maintained and there is no cord expansion. No abnormal intradural enhancement. No suspicious dural thickening. SOFT TISSUES: Widespread bilaterally posterior paraspinal muscle enhancement and patchy mild increased STIR hyperintensity. This appearance is nonspecific but can be seen in chronically debilitated patients. Abnormal right internal jugular vein thrombosis and expansion is visible on series 4 image 22. An enteric tube courses into the esophagus, partially visible. No paraspinal fluid collection. DEGENERATIVE: Advanced chronic cervical spine degeneration except at the C2-C3 level. At C4-C5 there is severe multifocal degenerative spinal stenosis related to bulky posterior disc osteophyte complex and posterior element hypertrophy. Severe spinal cord mass effect. Severe bilateral C5 neural foraminal stenosis. At C5-C6 there is moderate to severe degenerative spinal stenosis and spinal cord mass effect related to similar bulky disc osteophyte complex  and posterior element hypertrophy (series 4 image 24). Severe bilateral C6 neural foraminal stenosis. At both C3-C4 and C6-C7 there is mild spinal stenosis without cord mass effect. Thoracic spine is reported separately today. OTHER FINDINGS: Partially visible  paranasal sinus mucosal thickening and opacification. Negative cervicomedullary junction. Negative visible posterior fossa. Partially visible bilateral mastoid air cell effusions. The major arterial flow voids in the visible neck are preserved. Lung apices appear ventilated with mild curvilinear opacity which is nonspecific. IMPRESSION: 1. Advanced chronic cervical degeneration with no evidence of acute or inflammatory cervical spinal process. But Severe multifactorial degenerative spinal stenosis AND severe spinal cord mass effect at both C4-C5 and C5-C6. Indistinct abnormal spinal cord T2 signal at both levels, more suggestive of myelomalacia than edema. Recommend Spine Surgery consultation. 2. Associated severe bilateral foraminal stenosis at both levels. Mild cervical spinal stenosis elsewhere without cord mass effect. 3. Known Right internal jugular vein thrombosis. 4. Generalized patchy cervical paraspinal soft tissue stenosis and hyperintensity and enhancement, nonspecific, but frequently seen in chronically debilitated patients. No paraspinal fluid collection. Electronically signed by: Helayne Hurst MD 07/16/2024 12:59 PM EST RP Workstation: [id number]   MR THORACIC SPINE W WO CONTRAST Result Date: 07/16/2024 EXAM: MRI THORACIC SPINE WITHOUT AND WITH INTRAVENOUS CONTRAST 07/16/2024 12:33:23 PM TECHNIQUE: Multiplanar multisequence MRI of the thoracic spine was performed without and with the administration of intravenous contrast. COMPARISON: Cervical spine MRI on 07/16/2024, reported separately. CLINICAL HISTORY: 78 year old female with lower extremity weakness. FINDINGS: Thoracic segmentation appears to be normal. BONES AND ALIGNMENT: Normal  alignment. Normal vertebral body heights. Bone marrow signal is unremarkable. No abnormal enhancement. No marrow edema. SPINAL CORD: There is a mild generalized loss of thoracic spinal cord volume, such as seen at the T7 spinal cord level. Normal conus medullaris at T12-L1. No convincing abnormal thoracic spinal cord signal when allowing for CSF pulsation artifact. SOFT TISSUES: Generalized subcutaneous edema posterior to the thoracic spine. The thoracic paraspinal musculature appears within normal limits. DEGENERATIVE: Capacious thoracic spinal canal at most levels, no age-advanced thoracic spine degeneration at most levels. Occasional small thoracic paracentral disc protrusions, most notably at T7-T8, no thoracic spinal stenosis. PLEURAL SPACES: Small to moderate layering left pleural effusion. Trace layering right pleural effusion. Patchy nonspecific bilateral visible lung opacity. IMPRESSION: 1. No acute or inflammatory process identified in the thoracic spine. Mild generalized loss of thoracic spinal cord volume is suspected, without convincing abnormal thoracic cord signal. 2. Generally mild for age thoracic spine degeneration, and no thoracic spinal stenosis. 3. Small to moderate left pleural effusion and trace right pleural effusion. Patchy bilateral lung opacities, nonspecific by MRI. Electronically signed by: Helayne Hurst MD 07/16/2024 01:11 PM EST RP Workstation: [id number]   DG Chest Port 1 View Result Date: 07/16/2024 EXAM: 1 VIEW(S) XRAY OF THE CHEST 07/16/2024 05:36:17 AM COMPARISON: Portable chest 07/14/2024 at 11:17 AM. CLINICAL HISTORY: Pleural effusion. FINDINGS: LINES, TUBES AND DEVICES: Stable positioning of the tracheostomy cannula. Right PICC again terminates at the superior cavoatrial junction. Feeding tube entering the stomach with the intragastric course out of view. Pigtail right chest tube with tip in the medial right chest base. LUNGS AND PLEURA: There is a small layering left pleural  effusion. There is increased consolidation or atelectasis in the retrocardiac left lower lobe. Increased patchy haziness left upper to mid lung. Increased hazy opacity in the medial right base. Findings are concerning for multifocal pneumonia with background emphysematous and chronic changes. There is trace right apical pneumothorax with improvement. No pneumothorax on the left. HEART AND MEDIASTINUM: Stable mediastinum with aortic tortuosity and calcific plaque. Mild cardiomegaly with no evidence of CHF. BONES AND SOFT TISSUES: No acute osseous abnormality. IMPRESSION: 1. Findings concerning for multifocal pneumonia. 2. Small layering left pleural  effusion. 3. Trace right apical pneumothorax with interval improvement. Electronically signed by: Francis Quam MD 07/16/2024 07:17 AM EST RP Workstation: [id number]   US  RENAL Result Date: 07/15/2024 EXAM: US  Retroperitoneum Complete, Renal. CLINICAL HISTORY: 409830 AKI (acute kidney injury) 409830 AKI (acute kidney injury) TECHNIQUE: Real-time ultrasound of the retroperitoneum (complete) with image documentation. COMPARISON: None provided. FINDINGS: RIGHT KIDNEY: The right kidney measures 10.9 x 5 x 5.6 cm, volume 159 ml. No hydronephrosis, renal stone, or mass visualized. LEFT KIDNEY: The left kidney measures 9.6 x 5.2 x 4.4 cm, volume 114 ml. No hydronephrosis, renal stone, or mass visualized. BLADDER: Urinary bladder not visualized. PLEURAL SPACES: Left pleural effusion. IMPRESSION: 1. Urinary bladder not visualized. Kidneys unremarkable. 2. Left pleural effusion. Electronically signed by: Kate Plummer MD 07/15/2024 08:50 PM EST RP Workstation: [id number]   ECHOCARDIOGRAM LIMITED Result Date: 07/14/2024    ECHOCARDIOGRAM LIMITED REPORT   Patient Name:   Katie Waller Date of Exam: 07/14/2024 Medical Rec #:  [id number]        Height:       61.0 in Accession #:    [id number]       Weight:       166.2 lb Date of Birth:  1946-08-29       BSA:          1.746 m  Patient Age:    77 years         BP:           132/76 mmHg Patient Gender: F                HR:           102 bpm. Exam Location:  Inpatient Procedure: Limited Echo (Both Spectral and Color Flow Doppler were utilized            during procedure). Indications:    Congestive Heart Failure I50.9  History:        Patient has prior history of Echocardiogram examinations, most                 recent 07/10/2024. CHF, COPD; Risk Factors:Hypertension.  Sonographer:    Jayson Gaskins Referring Phys: [id number] DANIEL C SMITH IMPRESSIONS  1. LImited echo.  2. Right ventricular systolic function is normal. The right ventricular size is normal.  3. The mitral valve is grossly normal.  4. The aortic valve was not well visualized.  5. The inferior vena cava is dilated in size with <50% respiratory variability, suggesting right atrial pressure of 15 mmHg. FINDINGS  Left Ventricle: The left ventricular internal cavity size was normal in size. There is no left ventricular hypertrophy. Right Ventricle: The right ventricular size is normal. Right vetricular wall thickness was not assessed. Right ventricular systolic function is normal. Left Atrium: Left atrial size was normal in size. Right Atrium: Right atrial size was normal in size. Pericardium: Trivial pericardial effusion is present. Mitral Valve: The mitral valve is grossly normal. There is mild thickening of the mitral valve leaflet(s). Tricuspid Valve: The tricuspid valve is grossly normal. Aortic Valve: The aortic valve was not well visualized. Pulmonic Valve: The pulmonic valve was not assessed. Venous: The inferior vena cava is dilated in size with less than 50% respiratory variability, suggesting right atrial pressure of 15 mmHg. IAS/Shunts: No atrial level shunt detected by color flow Doppler. LEFT VENTRICLE PLAX 2D LVIDd:         4.60 cm LVIDs:         3.10 cm  LV PW:         1.10 cm LV IVS:        1.00 cm LVOT diam:     1.80 cm LVOT Area:     2.54 cm   SHUNTS Systemic Diam:  1.80 cm Vina Gull MD Electronically signed by Vina Gull MD Signature Date/Time: 07/14/2024/3:27:19 PM    Final    DG CHEST PORT 1 VIEW Result Date: 07/14/2024 CLINICAL DATA:  Pneumothorax. EXAM: PORTABLE CHEST 1 VIEW COMPARISON:  Earlier today. FINDINGS: Right basilar pigtail catheter unchanged in position. Small right apical pneumothorax, minimally increased from earlier today. Endotracheal tube and weighted enteric tube remain in place. Stable positioning of right upper extremity PICC. Again seen hyperinflation with coarsened lung markings. Ill-defined opacity at the left lung base with small effusion, unchanged. Stable heart size and mediastinal contours. IMPRESSION: 1. Small right apical pneumothorax, minimally increased from earlier today. Right basilar pigtail catheter unchanged in position. 2. Unchanged ill-defined opacity at the left lung base with small effusion. Electronically Signed   By: Andrea Gasman M.D.   On: 07/14/2024 11:47   DG Chest Port 1 View Result Date: 07/14/2024 EXAM: 1 VIEW(S) XRAY OF THE CHEST 07/14/2024 11:19:00 AM COMPARISON: 07/14/2024 CLINICAL HISTORY: Status post tracheostomy (HCC) FINDINGS: LINES, TUBES AND DEVICES: Tracheostomy tube in place with tip 5.5 cm above the carina. Right upper extremity PICC in place with tip at superior cavoatrial junction. Enteric tube in place, extending beyond field-of-view. Stable right medial basilar chest tube. LUNGS AND PLEURA: Small left pleural effusion. Small right apical pneumothorax, unchanged from previous exam. Chronic coarsened interstitial markings. Left basilar heterogeneous opacities which may reflect mild atelectasis versus airspace disease. No pulmonary edema. HEART AND MEDIASTINUM: Aortic atherosclerotic calcifications. No acute abnormality of the cardiac silhouette. BONES AND SOFT TISSUES: No acute osseous abnormality. IMPRESSION: 1. Status post tracheostomy tube placement with tip above the carina. 2. Small right apical  pneumothorax, unchanged from previous exam. 3. Small left pleural effusion. 4. Left basilar heterogeneous opacities, possibly representing mild atelectasis versus airspace disease. Electronically signed by: Waddell Calk MD 07/14/2024 11:39 AM EST RP Workstation: [id number]   DG CHEST PORT 1 VIEW Result Date: 07/14/2024 EXAM: 1 VIEW(S) XRAY OF THE CHEST 07/14/2024 01:13:00 AM COMPARISON: 07/13/2024 CLINICAL HISTORY: Pneumothorax FINDINGS: LINES, TUBES AND DEVICES: ETT in place with tip 2.3 cm above the carina. Enteric tube courses through the chest to the abdomen beyond the field-of-view. Right basilar chest tube stable in position. Right PICC tip terminates over the region of the left brachiocephalic vein. LUNGS AND PLEURA: Decreased right apical pneumothorax. Stable left basilar atelectasis and small left pleural effusion. Chronic coarsened interstitial markings. HEART AND MEDIASTINUM: Aortic atherosclerosis. BONES AND SOFT TISSUES: No acute osseous abnormality. IMPRESSION: 1. Decreased right apical pneumothorax. Electronically signed by: Oneil Devonshire MD 07/14/2024 01:40 AM EST RP Workstation: [id number]   DG CHEST PORT 1 VIEW Result Date: 07/13/2024 EXAM: 1 VIEW(S) XRAY OF THE CHEST 07/13/2024 07:51:47 PM COMPARISON: 07/13/2024 CLINICAL HISTORY: Pneumothorax FINDINGS: LINES, TUBES AND DEVICES: ETT in place with tip 2.8 cm above the carina. Enteric tube courses through the chest to the abdomen beyond the field-of-view. Pleural catheter projects over the right lower hemithorax. Right upper extremity PICC tip terminates over the superior cavoatrial junction. LUNGS AND PLEURA: Small to moderate right apical pneumothorax, significantly decreased compared to prior. Stable appearing left basilar atelectasis. Chronic coarsened interstitial markings. HEART AND MEDIASTINUM: Atherosclerotic calcifications. BONES AND SOFT TISSUES: No acute osseous abnormality. IMPRESSION: 1. Small to moderate  right apical pneumothorax,  significantly decreased compared to prior. 2. Similar-appearing atelectasis  of the left lung base. Electronically signed by: Oneil Devonshire MD 07/13/2024 08:13 PM EST RP Workstation: [id number]   DG Chest Port 1 View Addendum Date: 07/13/2024  ADDENDUM #1  ADDENDUM: Impression: 1. Moderate to large right pneumothorax with mild mediastinal shift to the left. 2. The endotracheal tube is at the level of the carina and should be repositioned. 3. The small left pleural effusion is stable. 4. The right chest tube is unchanged in position. Dr. JONETTA Sharps was notified of results at 6:42 pm on 07/13/24 concerning right pneumothorax and inadequate positioning of the endotracheal tube. ---------------------------------------------------- Electronically signed by: Greig Pique MD 07/13/2024 06:52 PM EST RP Workstation: [id number]   Result Date: 07/13/2024  ORIGINAL REPORT EXAM: 1 VIEW(S) XRAY OF THE CHEST 07/13/2024 06:24:02 PM COMPARISON: Chest x-ray 09/23/2023. CLINICAL HISTORY: [id number] History of ETT [id number] History of ETT [id number] FINDINGS: LINES, TUBES AND DEVICES: Right chest tube is unchanged in position. Endotracheal tube is at the level of the carina. Enteric tube extends below the diaphragm. LUNGS AND PLEURA: No focal pulmonary opacity. No pulmonary edema. Right pneumothorax has significantly increased and is now moderate to large. Small left pleural effusion is unchanged. No spasms or atelectasis. HEART AND MEDIASTINUM: No acute abnormality of the cardiac silhouette. Mild mediastinal shift to the left. BONES AND SOFT TISSUES: No acute osseous abnormality. IMPRESSION: 1. Increasing moderate to large right pneumothorax with mild mediastinal shift to the left. 2. Small left pleural effusion, unchanged. 3. Right chest tube unchanged in position. Electronically signed by: Greig Pique MD 07/13/2024 06:41 PM EST RP Workstation: [id number]   DG Chest Port 1 View Result Date: 07/13/2024 EXAM: 1 VIEW(S) XRAY OF THE CHEST  07/13/2024 03:04:00 PM COMPARISON: Chest x-ray 09/23/2023. CT chest abdomen and pelvis 09/22/2023. CLINICAL HISTORY: [id number] Chest tube in place [id number] Chest tube in place [id number] FINDINGS: LINES, TUBES AND DEVICES: New right-sided chest tube in place. Right upper extremity line terminates in the SVC. LUNGS AND PLEURA: Small right apical pneumothorax present measuring 1.9 cm from the lung apex. There is a stable small left pleural effusion. Right pleural effusion has resolved. There are some nodular densities in the left upper lobe which appear similar to prior CT. No pulmonary edema. HEART AND MEDIASTINUM: Atherosclerotic calcifications in the aorta. The heart is mildly enlarged and unchanged. BONES AND SOFT TISSUES: No acute osseous abnormality. IMPRESSION: 1. Small right apical pneumothorax. 2. Right chest with 2 lymph nodes. 3. Stable small left pleural effusion. Right pleural effusion has resolved. Electronically signed by: Greig Pique MD 07/13/2024 03:18 PM EST RP Workstation: [id number]   DG Chest Port 1 View Result Date: 07/13/2024 EXAM: 1 VIEW(S) XRAY OF THE CHEST 07/13/2024 12:59:38 PM COMPARISON: None available. CLINICAL HISTORY: Respiratory failure (HCC) FINDINGS: LINES, TUBES AND DEVICES: Feeding tube extends into abdomen with tip below lower margin of image. Right arm PICC with tip in distal superior vena cava. LUNGS AND PLEURA: Persistent left lower lung retrocardiac opacification and small to moderate left pleural effusion. Increased volume of right pleural effusion with worsening aeration to the right base. Mildly increased interstitial markings compatible with edema, increased from prior exam. No pneumothorax. HEART AND MEDIASTINUM: Borderline cardiomegaly. Atherosclerotic calcifications in thoracic aorta. BONES AND SOFT TISSUES: No acute osseous abnormality. IMPRESSION: 1. Bibasilar opacities concerning for atelectasis and/or consolidation. 2. Moderate bilateral pleural effusions.  Right pleural effusion has increased in volume from the previous exam 3. Mild pulmonary edema.  Electronically signed by: Waddell Calk MD 07/13/2024 01:20 PM EST RP Workstation: [id number]   CT CHEST ABDOMEN PELVIS W CONTRAST Result Date: 07/12/2024 EXAM: CT CHEST, ABDOMEN AND PELVIS WITH CONTRAST 07/12/2024 01:30:00 PM TECHNIQUE: CT of the chest, abdomen and pelvis was performed with the administration of intravenous contrast. Multiplanar reformatted images are provided for review. Automated exposure control, iterative reconstruction, and/or weight based adjustment of the mA/kV was utilized to reduce the radiation dose to as low as reasonably achievable. CONTRAST: 60 mL of Omnipaque  350. COMPARISON: CT chest 10/31/2021 and PET CT 05/29/2022. CLINICAL HISTORY: Sepsis, persistent fever, abdominal pain, rule out occult infection. FINDINGS: CHEST: MEDIASTINUM AND LYMPH NODES: Heart: Trace pericardial fluid. Coronary arteries have atherosclerotic calcifications. Pericardium: Trace pericardial fluid. The central airways are clear. Enteric tube seen throughout nondilated esophagus. No mediastinal, hilar or axillary lymphadenopathy. Atherosclerotic calcifications of the aorta. Thrombus within the right internal jugular vein in the lower neck. LUNGS AND PLEURA: Severe emphysema. Compressive atelectasis and consolidation of the entire left lower lobe, therefore previously identified left lower lobe nodule cannot be evaluated on this exam. Compressive atelectasis in the right lower lobe. Additional patchy airspace disease in the posterior aspect of the left upper lobe. Ground glass nodular density in the left upper lobe measuring 8 mm (image 4/39) which has increased in size. New small bilateral pleural effusions, left greater than right. No pneumothorax. ABDOMEN AND PELVIS: LIVER: Scattered rounded hypodensities throughout the liver which are too small to characterize and unchanged, probable cysts or hemangiomas.  GALLBLADDER AND BILE DUCTS: Gallstones are present. No biliary ductal dilatation. SPLEEN: No acute abnormality. PANCREAS: No acute abnormality. ADRENAL GLANDS: No acute abnormality. KIDNEYS, URETERS AND BLADDER: No stones in the kidneys or ureters. No hydronephrosis. No perinephric or periureteral stranding. Urinary bladder is unremarkable. GI AND BOWEL: NG tip is in the body of the stomach. The stomach remains moderately distended with air-fluid level. There is no bowel obstruction. Diffuse colonic diverticulosis. Scattered air-fluid levels throughout the colon. The appendix appears normal. REPRODUCTIVE ORGANS: No acute abnormality. PERITONEUM AND RETROPERITONEUM: No ascites. No free air. VASCULATURE: Severe atherosclerotic calcifications of the aorta and iliac arteries. ABDOMINAL AND PELVIS LYMPH NODES: No lymphadenopathy. BONES AND SOFT TISSUES: No acute osseous abnormality. Subcutaneous density in the right inguinal region is new from prior measuring 2.3 x 1.1 cm. Small fat-containing umbilical hernia. Injection sites noted in the anterior abdominal wall. Lumbar subcutaneous edema appears unchanged. IMPRESSION: 1. Thrombus within the right internal jugular vein in the lower neck. 2. Compressive atelectasis and consolidation of the entire left lower lobe, obscuring evaluation of the previously identified left lower lobe nodule. 3. Ground-glass nodular density in the left upper lobe measuring 8 mm, increased in size; consider non-contrast chest CT at 3 months, PET/CT, or tissue sampling per Fleischner Society Guidelines for single nodules 8.120 mm. 4. New small bilateral pleural effusions, left greater than right. 5. Severe emphysema. 6. Gallstones without biliary ductal dilatation. 7. Diffuse colonic diverticulosis without evidence of diverticulitis. Electronically signed by: Greig Pique MD 07/12/2024 09:33 PM EST RP Workstation: [id number]   US  EKG SITE RITE Result Date: 07/12/2024 If Site Rite image not  attached, placement could not be confirmed due to current cardiac rhythm.  VAS US  LOWER EXTREMITY VENOUS (DVT) Result Date: 07/12/2024  Lower Venous DVT Study Patient Name:  Katie Waller  Date of Exam:   07/11/2024 Medical Rec #: [id number]         Accession #:    [id number] Date of Birth:  05-12-1946        Patient Gender: F Patient Age:   67 years Exam Location:  [facility] Procedure:      VAS US  LOWER EXTREMITY VENOUS (DVT) Referring Phys: JOETTE PEBBLES --------------------------------------------------------------------------------  Indications: Swelling, Edema, stroke, and hypoxia, respiratory failure.  Comparison Study: Previous study on 11.26.2018. Performing Technologist: Edilia Elden Appl  Examination Guidelines: A complete evaluation includes B-mode imaging, spectral Doppler, color Doppler, and power Doppler as needed of all accessible portions of each vessel. Bilateral testing is considered an integral part of a complete examination. Limited examinations for reoccurring indications may be performed as noted. The reflux portion of the exam is performed with the patient in reverse Trendelenburg.  +---------+---------------+---------+-----------+----------+--------------+ RIGHT    CompressibilityPhasicitySpontaneityPropertiesThrombus Aging +---------+---------------+---------+-----------+----------+--------------+ CFV      Full           Yes      Yes                                 +---------+---------------+---------+-----------+----------+--------------+ SFJ      Full           Yes      Yes                                 +---------+---------------+---------+-----------+----------+--------------+ FV Prox  Full                                                        +---------+---------------+---------+-----------+----------+--------------+ FV Mid   Full                                                         +---------+---------------+---------+-----------+----------+--------------+ FV DistalFull                                                        +---------+---------------+---------+-----------+----------+--------------+ PFV      Full                                                        +---------+---------------+---------+-----------+----------+--------------+ POP      Full           Yes      Yes                                 +---------+---------------+---------+-----------+----------+--------------+ PTV      Full                                                        +---------+---------------+---------+-----------+----------+--------------+  PERO     Full                                                        +---------+---------------+---------+-----------+----------+--------------+   +---------+---------------+---------+-----------+----------+--------------+ LEFT     CompressibilityPhasicitySpontaneityPropertiesThrombus Aging +---------+---------------+---------+-----------+----------+--------------+ CFV      Full           Yes      Yes                                 +---------+---------------+---------+-----------+----------+--------------+ SFJ      Full           Yes      Yes                                 +---------+---------------+---------+-----------+----------+--------------+ FV Prox  Full                                                        +---------+---------------+---------+-----------+----------+--------------+ FV Mid   Full                                                        +---------+---------------+---------+-----------+----------+--------------+ FV DistalFull                                                        +---------+---------------+---------+-----------+----------+--------------+ PFV      Full                                                         +---------+---------------+---------+-----------+----------+--------------+ POP      Full           Yes      Yes                                 +---------+---------------+---------+-----------+----------+--------------+ PTV      Full                                                        +---------+---------------+---------+-----------+----------+--------------+ PERO     Full                                                        +---------+---------------+---------+-----------+----------+--------------+  Summary: BILATERAL: - No evidence of deep vein thrombosis seen in the lower extremities, bilaterally. -No evidence of popliteal cyst, bilaterally.   *See table(s) above for measurements and observations. Electronically signed by Lonni Gaskins MD on 07/12/2024 at 9:36:05 AM.    Final    DG CHEST PORT 1 VIEW Result Date: 07/11/2024 EXAM: 1 VIEW(S) XRAY OF THE CHEST 07/11/2024 09:01:12 AM COMPARISON: 07/07/2024 CLINICAL HISTORY: Fever FINDINGS: LINES, TUBES AND DEVICES: Enteric tube removed. Extubation. LUNGS AND PLEURA: Small bilateral pleural effusions, increased from prior exams. Left base retrocardiac opacification is new from the previous exam and may reflect atelectasis aspiration for pneumonia. Mild peripheral opacity in the right base, nonspecific. Metallic clip projecting over left hilum. No pulmonary edema. No pneumothorax. HEART AND MEDIASTINUM: Aortic atherosclerosis. BONES AND SOFT TISSUES: No acute osseous abnormality. IMPRESSION: 1. New left base retrocardiac opacification, possibly atelectasis or aspiration pneumonia. 2. Small bilateral pleural effusions, increased from prior exams. Electronically signed by: Waddell Calk MD 07/11/2024 03:02 PM EST RP Workstation: [id number]   ECHOCARDIOGRAM LIMITED Result Date: 07/10/2024    ECHOCARDIOGRAM LIMITED REPORT   Patient Name:   Katie Waller Date of Exam: 07/10/2024 Medical Rec #:  [id number]        Height:       61.0 in  Accession #:    [id number]       Weight:       141.5 lb Date of Birth:  1946/05/28       BSA:          1.631 m Patient Age:    77 years         BP:           156/78 mmHg Patient Gender: F                HR:           118 bpm. Exam Location:  Inpatient Procedure: 2D Echo, Cardiac Doppler and Color Doppler (Both Spectral and Color            Flow Doppler were utilized during procedure). Indications:    Atrail Fibrillation                 Pericardial Effusion  History:        Patient has prior history of Echocardiogram examinations, most                 recent 07/01/2024. Arrythmias:Atrial Fibrillation.  Sonographer:    Koleen Popper RDCS Referring Phys: [phone] [facility]  Sonographer Comments: Image acquisition challenging due to respiratory motion. IMPRESSIONS  1. Left ventricular ejection fraction, by estimation, is 65 to 70%. The left ventricle has normal function. Left ventricular endocardial border not optimally defined to evaluate regional wall motion. There is mild left ventricular hypertrophy. Left ventricular diastolic parameters are consistent with Grade I diastolic dysfunction (impaired relaxation).  2. Right ventricular systolic function is normal. The right ventricular size is normal.  3. A small pericardial effusion is present. There is no evidence of cardiac tamponade.  4. Mild mitral valve regurgitation. FINDINGS  Left Ventricle: Left ventricular ejection fraction, by estimation, is 65 to 70%. The left ventricle has normal function. Left ventricular endocardial border not optimally defined to evaluate regional wall motion. There is mild left ventricular hypertrophy. Left ventricular diastolic parameters are consistent with Grade I diastolic dysfunction (impaired relaxation). Left ventricular diastolic function could not be evaluated due to atrial fibrillation. Normal left ventricular filling pressure. Right Ventricle: The right ventricular  size is normal. No increase in right ventricular wall  thickness. Right ventricular systolic function is normal. Left Atrium: Left atrial size was normal in size. Right Atrium: Right atrial size was normal in size. Pericardium: A small pericardial effusion is present. There is no evidence of cardiac tamponade. Mitral Valve: Mild mitral valve regurgitation. Tricuspid Valve: Tricuspid valve regurgitation is mild. Aortic Valve: Aortic valve peak gradient measures 12.7 mmHg. Additional Comments: Spectral Doppler performed. Color Doppler performed.  LEFT VENTRICLE PLAX 2D LVIDd:         4.80 cm Diastology LVIDs:         2.90 cm LV e' medial:    7.65 cm/s LV PW:         1.20 cm LV E/e' medial:  9.4 LV IVS:        0.90 cm LV e' lateral:   8.21 cm/s                        LV E/e' lateral: 8.7  RIGHT VENTRICLE         IVC TAPSE (M-mode): 1.7 cm  IVC diam: 2.10 cm LEFT ATRIUM         Index LA diam:    3.90 cm 2.39 cm/m  AORTIC VALVE AV Vmax:      178.00 cm/s AV Peak Grad: 12.7 mmHg LVOT Vmax:    166.00 cm/s LVOT Vmean:   114.000 cm/s LVOT VTI:     0.240 m  AORTA Ao Asc diam: 3.60 cm MITRAL VALVE MV Area (PHT): 4.49 cm     SHUNTS MV Decel Time: 169 msec     Systemic VTI: 0.24 m MV E velocity: 71.80 cm/s MV A velocity: 104.00 cm/s MV E/A ratio:  0.69 Annabella Scarce MD Electronically signed by Annabella Scarce MD Signature Date/Time: 07/10/2024/5:15:41 PM    Final    DG Chest Port 1 View Result Date: 07/07/2024 EXAM: 1 VIEW(S) XRAY OF THE CHEST 07/07/2024 02:29:20 AM COMPARISON: AP chest dated 06/30/2024. CLINICAL HISTORY: Respiratory distress. FINDINGS: LINES, TUBES AND DEVICES: An endotracheal tube and enteric catheter remain in satisfactory position. A right internal jugular central venous line has been removed in the interim. LUNGS AND PLEURA: The lungs are hyperexpanded and there are prominent interstitial opacities as before. No focal pulmonary opacity. No pulmonary edema. No pleural effusion. No pneumothorax. HEART AND MEDIASTINUM: The heart is enlarged. There is  moderate calcific atheromatous disease within the aortic arch. BONES AND SOFT TISSUES: No acute osseous abnormality. IMPRESSION: 1. Enlarged heart and moderate calcific atheromatous disease within the aortic arch. 2. Hyperexpanded lungs with prominent interstitial opacities, unchanged from prior study. Electronically signed by: Evalene Coho MD 07/07/2024 03:13 AM EST RP Workstation: [id number]   DG Abd 1 View Result Date: 07/05/2024 CLINICAL DATA:  NG tube placement EXAM: DG ABDOMEN 1V COMPARISON:  06/29/2024 FINDINGS: Enteric tube tip and side port overlie the mid stomach. Moderate air distension of the stomach. IMPRESSION: Enteric tube tip and side port overlie the mid stomach. Electronically Signed   By: Luke Bun M.D.   On: 07/05/2024 18:30    Microbiology: Recent Results (from the past 240 hours)  Culture, Respiratory w Gram Stain     Status: None   Collection Time: 07/22/24 11:42 AM   Specimen: Tracheal Aspirate; Respiratory  Result Value Ref Range Status   Specimen Description TRACHEAL ASPIRATE  Final   Special Requests NONE  Final   Gram Stain   Final    RARE  WBC PRESENT, PREDOMINANTLY PMN RARE BUDDING YEAST SEEN Performed at [facility], [address] [place], [place] [address]    Culture FEW CANDIDA ALBICANS  Final   Report Status 07/24/2024 FINAL  Final    Time spent: 20 minutes  Signed: Toma Edwards, DO 08/21/2024

## 2024-08-05 NOTE — Progress Notes (Signed)
 .  [name], MRN:  [id number], DOB:  03/08/1946, LOS: 36 ADMISSION DATE:  06/25/2024, CONSULTATION DATE:  06/25/2024 REFERRING MD:  Theodoro SAUNDERS, CHIEF COMPLAINT:  AMS, Sepsis, AR   History of Present Illness:  Katie Waller is a 78 yo female with past medical history significant for HTN, COPD, acute on chronic hypoxic, hypercapnic respiratory failure on 5-6L [place] baseline, diastolic heart failure, SCC Left lower lobe s/p radiation in September 2025, who presented via EMS after being found unresponsive at home. Code stroke called. On arrival to ED patient emergently intubated for acute respiratory distress and airway protection. CT Head with no ICH or large infarct, CTA head and neck with no LVO. CXR concerning for a Pneumonia with visible BL lung apices on CTA demonstrating infection/inflammation. Code stroke cancelled with presentation suspicious for sepsis. Patient received 30ml/kg crystalloid, pan cultured, and started empirically on Cefepime /Vanc/Azithromycin . Of note, family reports patient has recently not felt well and likely has had a cold. PCCM consulted for ICU admission.   On exam in ED, patient febrile to >101 and hypotensive, likely multifactorial in setting of presumed sepsis and recent sedation on induction with underlying heart disease. Norepinephrine  gtt was initiated for MAPs < 65 and patient was transferred to ICU with plans to gain central access for vasopressor administration and arterial line placement for hemodynamic monitoring.  Pertinent  Medical History   Past Medical History:  Diagnosis Date   Abnormal finding on EKG 09/19/2013   Acute on chronic respiratory failure with hypercapnia (HCC)    Acute respiratory failure (HCC) 06/08/2016   Anxiety 11/09/2020   Arthritis    Asthma    Carotid bruit 11/09/2020   Chronic diastolic CHF (congestive heart failure) (HCC) 07/28/2017   Chronic obstructive pulmonary disease, unspecified (HCC) 11/09/2020   COPD, group  D, by GOLD 2017 classification (HCC) 09/17/2013   Cough 11/09/2020   Decreased estrogen level 11/09/2020   Diabetes (HCC)    Dyspnea    Edema 11/09/2020   Essential hypertension 09/18/2013   Goals of care, counseling/discussion    Hardening of the aorta (main artery of the heart) 11/09/2020   Heart disease    History of radiation therapy    Left Lung- 06/20/23-06/26/23- Dr. 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   Transient ischemic attack 11/09/2020   Venous stasis of both lower extremities 06/13/2016     Significant Hospital Events: Including procedures, antibiotic start and stop dates in addition to other pertinent events   10/21 Found unresponsive at home->ED>Code stroke>intubated for ARD/airway protection>CTH/CTA negative>Code stroke cancelled>Hypotensive>started on pressors>Admit to ICU  Arterial line placed 10/21 10/22 and 10/23 unsuccessful extubation 10/24: Despite maximal Precedex  and fentanyl  pushes, unable to safely and comfortably clean the patient up so added prn Versed  and started fentanyl  infusion. Rectal tube added for diarrhea.  10/24: 2/3 BC resulted in staph capitis/epidermitis, extubated on BiPAP 10/25: patient remained on BiPAP for 24 hours, when she came off of BiPAP, could not tolerate became tachypneic, tachycardic went into respiratory distress requiring endotracheal reintubation 10/29: unsuccessful SBT,  went briefly back into RVR 10/30: unsuccessful weaning from sedation as BP/HR increased  considerably 11/2: extubated to HFNC 11/8: progressively became more hypoxic with decreased respiratory drive prompting ABG this a.m. which revealed toxic hypercapnic respiratory failure.  Transferred back to ICU for impending intubation; tried draining pleural spaces but still tired out 11/10: trach 11/10: aggressive diuresis with Lasix  80 followed by 160 followed by 260 + metolazone  and bicarb - no response>Nephrology consulted 11/11: nephro consult. LE weakness noted on exam with inability to move - neuro consulted, MRI C and T spine ordered. CT pulled yesterday, worsening ptx>2 R CT placed, ptx improved. CRRT started. Hypotensive>briefly on Levo 11/12: Levo stopped. MRI C spine>cervical stenosis.  11/13 Off TF.  11/14: Worsening hypotension in setting of tension pneumothorax. Resolved after chest tube was flushed with improved pressor requirement. Levophed  now 5 mcg/min 11/15 back on TF 11/16 started zosyn  for leukocytosis 11/17 TF held. Started on Ketamine . Received 1 pRBC 11/18 Art line came out  11/19 ABI ordered > undetectable doppler waveforms bilateral PTA/DPA > 11/20 CTA > Not a candidate for aortobifemoral bypass versus ax bifemoral 11/20: tolerated iHD without vasopressor infusion. This was on midodrine  10 mg Q8H. Vascular advised limited management options at this time (palliative AKA as tissue loss progresses). 11/21: called [facility] to request transfer to their facility at family's request for a second opinion. MD-to-MD conversation was conducted. Their intensivist then spoke to their vascular surgeon who advised that on their review the recommendations provided by our vascular surgery team were appropriate and transfer would be declined due to no higher level of care being required / services being available at current facility. This was conveyed to the family in real-time and there were no clarifying questions from family who were appreciative of the secondary  confirmation this provided. 11/22: Did well off fentanyl  without any boluses throughout the day. 11/23 Ketamine  stopped  11/24 Given lactulose  suppository, SMOG enema and started on Naltrexone  11/25 BM, will start with trickle feeds today  August 17, 2024 Fevers > started on Micafungin , zosyn , vanc. Temp HD removal   Interim History / Subjective:  O/N events: Abd distended > 2 BM > KUB > bowel gas pattern > flexiseal placed. > SBP 50s, given 500 cc LR bolus > started on Levophed  + Vasopressin . Given another LR bolus .  Temp 103.4; sedated on exam, does not awake or follow commands   Objective    Blood pressure (!) 93/49, pulse (!) 116, temperature (!) 97 F (36.1 C), temperature source Axillary, resp. rate (!) 25, height 5' 1 (1.549 m), weight 69.6 kg, SpO2 92%.    Vent Mode: PRVC FiO2 (%):  [40 %] 40 % Set Rate:  [25 bmp] 25 bmp Vt Set:  [380 mL] 380 mL PEEP:  [5 cmH20] 5 cmH20 Plateau Pressure:  [14 cmH20-19 cmH20] 14 cmH20   Intake/Output Summary (Last 24 hours) at 08/17/24 9376 Last data filed at 2024-08-17 0600 Gross per 24 hour  Intake 3500.75 ml  Output 2200 ml  Net 1300.75 ml   Filed Weights   07/29/24 0500 07/30/24 0455 07/30/24 0808  Weight: 71.1 kg 69.6 kg 69.6 kg    Examination: General: ill appearing  HENT: NGT  Neck: Trach in place  Lungs: on MV, with expiratory wheeze  Cardiovascular: irregular  Abdomen: soft, mildly distended  Extremities: Cold bilateral feet with darkening skin changes, no dp and pT pulses palpated.  Neuro: Awakens, does not follow commands  GU: No foley   UOP  0 Chest tube: ~20 NGT: ~ 80 HD 2.2 L UF  I/O 2.2 L   Pertinent Labs: WBC 6.6 (18.8), Hgb 8.8 (6.3), PLT 483 Na 132 (140), K 4.6, Scr 1.86 (2.37), BUN 76   Resolved problem list  Acute metabolic encephalopathy  Human metapneumovirus pneumonia CRRT-related hypotension Hypokalemia Hypomagnesemia Hypoglycemia    Assessment and Plan   Fevers  Hypotension  Suspecting  Shock in the setting of presumed sepsis from Temp HD site Acutely hypotensive overnight requiring fluid bolus x 2 and starting Levophed  at max dose and Vasopressin  T max 103.4 this AM Has multiple foreign body including temporary HD catheter  - Line holiday by removing temp HD cath  - Start Zosyn , Vanc and Micafungin  [broad spectrum] - Start Stress dose Solu cortef  IV BID  - Wean down vasopressors with MAP > 60 or SBP > 90  - Monitor fever curve - Give tylenol  for fever control  - Prior Abx:  Zosyn  11/16-11/23   Acute on chronic hypoxic & hypercapnic respiratory failure s/p trach 11/9 Fluid overload - baseline weight per daughter around 146-150 lbs BL effusions: s/p thoracentesis 11/8 with 600 cc transudative fluid Right PTX s/p chest tube: #1 R pigtail CT pulled 11/10; #2 R CT remained; immediate recurring ptx>R pigtail re-placed>both CT to - . 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 Aspiration of gastric secretions  - On oxycodone  5 mg Q6H per tube  - Continue PRN Fent pushes  - Trach and vent dependent  - Off ketamine  11/23  - pulmonary hygiene - Continue Brovana , Duonebs, Yupelri   - stopped ICS due to pneumonia risk  - LTVV - VAP prevention protocol - PAD protocol - currently no sedation  - daily SAT & SBT as appropriate - chest tube to wall suction  - Continue Zosyn  11/16- 11/23 - Tracheal Aspirate showed few yeast, pending    Afib with RVR, new onset, paroxysmal, rate controlled  Chronic HFpEF  HTN - Continue PO Amiodarone  200 mg per tube daily - Continue IV Heparin   - tele monitoring - monitor electrolytes and replete as needed    RIJ CVL associated blood clot - Continue IV heparin    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 There is hyperpigmentation/darkening of the skin dorsal and  ventral surfaces of the feet (images on the chart).  - US  ABI/TBI with undetectable doppler waveforms bilateral PTA/DPA.  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 -Appreciate NS's management; no plans for intervention of cervical stenosis given overall condition -PT, OT as patient tolerates    AOC anemia  Anemia of chronic kidney/chronic disease   S/p 1 pRBC 11/17, s/p 1 pRBC 11/19,  - Stable   - Transfuse if Hgb < 7   Anuric AKI Likely 2/2 sepsis/pneumonia and the use of antibiotics  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], 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, 11/20, 11/22, 11/23, 11/25] Next HD on Friday, Temp HD removal and plan for permanent HD  - Midodrine  10 TID  - avoid nephrotoxins - strict I/O - renally dose meds, avoid nephrotoxic meds   Ileus -  Improving  BM 08/09/24  Bowel regimen:  - Dulcolax supposiory daily, Miralax  BID, sennaKot BID  - PRN Miralax  daily  - PRN Mylicon Q8H   - PRN SMOG enema  NGT to suction   Sacrum wound and bilateral buttock, bilateral thigh, DTI  - Wound care following, recs:  - Continue applying Santyl  to bilat buttocks and sacrum Q day, then cover with moist gauze and sacrum foam dressing.   - Change foam dressing Q 3 days or PRN soiling  DVT PPX: Heparin   GI PPX: Protonix   Code Status: Full scope of care as of today; [facility] to Peer for [facility]   Labs   CBC: Recent Labs  Lab 07/27/24 0424 07/28/24 0500 07/29/24 0434 07/30/24 0446 2024/08/09 0200  WBC 20.3* 21.5* 21.6* 18.8* 6.6  HGB 7.0* 7.3* 7.0* 6.3* 8.8*  HCT 20.9* 21.4* 20.8* 18.3* 26.0*  MCV 88.2 87.3 87.8 87.1 87.5  PLT 245 352 403* 445* 483*    Basic Metabolic Panel: Recent Labs  Lab 07/26/24 0722 07/27/24 0424 07/28/24 0500  07/29/24 0434 07/30/24 0446  NA 133* 132* 137 137 140  K 3.3* 3.4* 4.1 3.5 4.1  CL 94* 85* 98 94* 100  CO2 23 18* 22 26 20*  GLUCOSE 158* 278* 189* 159* 214*  BUN 66* 93* 81* 63* 118*  CREATININE 1.98* 2.83* 2.04* 1.43* 2.37*  CALCIUM  8.1* 7.6* 8.5* 8.9 8.9  MG 1.7 2.2 2.1 2.0 2.3  PHOS 1.9* 4.2 4.3 3.0 5.8*   GFR: Estimated Creatinine Clearance: 18.3 mL/min (A) (by C-G formula based on SCr of 2.37 mg/dL (H)). Recent Labs  Lab 07/28/24 0500 07/29/24 0434 07/30/24 0446 09-Aug-2024 0200  WBC 21.5* 21.6* 18.8* 6.6    Liver Function Tests: Recent Labs  Lab 07/25/24 0421 07/26/24 0409 07/27/24 0424 07/28/24 0500 07/29/24 0434 07/30/24 0446  AST 136*  --   --   --  46*  --   ALT 337*  --   --   --  94*  --   ALKPHOS 146*  --   --   --  136*  --   BILITOT 0.7  --   --   --  0.6  --   PROT 5.3*  --   --   --  6.2*  --   ALBUMIN  <1.5* 1.6* <1.5* 1.8* 2.2* 1.7*   No results for input(s): LIPASE, AMYLASE in the last 168 hours. No results for input(s): AMMONIA in the last 168 hours.  ABG    Component Value Date/Time   PHART 7.370 07/24/2024 0456   PCO2ART 53.3 (H) 07/24/2024 0456   PO2ART 107 07/24/2024 0456   HCO3 30.9 (H) 07/24/2024 0456   TCO2 32 07/24/2024 0456   ACIDBASEDEF 2.0 07/19/2024 2016   O2SAT 98 07/24/2024 0456     Coagulation Profile: No results for input(s): INR, PROTIME in the last 168 hours.  Cardiac Enzymes: No results for input(s): CKTOTAL, CKMB, CKMBINDEX, TROPONINI in the last 168 hours.  HbA1C: Hgb A1c MFr Bld  Date/Time Value Ref Range Status  06/26/2024 04:47 AM 5.6 4.8 - 5.6 % Final    Comment:    (NOTE) Diagnosis of Diabetes The following HbA1c ranges recommended by the American Diabetes Association (ADA) may be used as an aid in the diagnosis of diabetes mellitus.  Hemoglobin             Suggested A1C NGSP%              Diagnosis  <5.7  Non Diabetic  5.7-6.4                 Pre-Diabetic  >6.4                   Diabetic  <7.0                   Glycemic control for                       adults with diabetes.    09/17/2013 01:57 PM 6.1 (H) <5.7 % Final    Comment:    (NOTE)                                                                       According to the ADA Clinical Practice Recommendations for 2011, when HbA1c is used as a screening test:  >=6.5%   Diagnostic of Diabetes Mellitus           (if abnormal result is confirmed) 5.7-6.4%   Increased risk of developing Diabetes Mellitus References:Diagnosis and Classification of Diabetes Mellitus,Diabetes Care,2011,34(Suppl 1):S62-S69 and Standards of Medical Care in         Diabetes - 2011,Diabetes Care,2011,34 (Suppl 1):S11-S61.    CBG: Recent Labs  Lab 07/30/24 1155 07/30/24 1534 07/30/24 1948 07/30/24 2337 08-26-24 0341  GLUCAP 137* 197* 147* 145* 136*    Review of Systems:   As above   Past Medical History:  She,  has a past medical history of Abnormal finding on EKG (09/19/2013), Acute on chronic respiratory failure with hypercapnia (HCC), Acute respiratory failure (HCC) (06/08/2016), Anxiety (11/09/2020), Arthritis, Asthma, Carotid bruit (11/09/2020), Chronic diastolic CHF (congestive heart failure) (HCC) (07/28/2017), Chronic obstructive pulmonary disease, unspecified (HCC) (11/09/2020), COPD, group D, by GOLD 2017 classification (HCC) (09/17/2013), Cough (11/09/2020), Decreased estrogen level (11/09/2020), Diabetes (HCC), Dyspnea, Edema (11/09/2020), Essential hypertension (09/18/2013), Goals of care, counseling/discussion, Hardening of the aorta (main artery of the heart) (11/09/2020), Heart disease, History of radiation therapy, Hypertension, Hypertensive heart failure (HCC) (11/09/2020), Hypoxia (11/09/2020), Insomnia (11/09/2020), Iron deficiency anemia (11/09/2020), Large liver (11/09/2020), Near syncope (09/17/2013), Osteoporosis (11/09/2020), Palliative care encounter, Prediabetes  (09/19/2013), Pure hypercholesterolemia (11/09/2020), Raynaud's disease (11/09/2020), Sinus tachycardia (09/18/2013), Skin sensation disturbance (11/09/2020), Smoker (09/17/2013), 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   Surgical History:   Past Surgical History:  Procedure Laterality Date   BRONCHIAL BIOPSY  05/25/2023   Procedure: BRONCHIAL BIOPSIES;  Surgeon: Brenna Adine CROME, DO;  Location: [facility];  Service: Pulmonary;;   BRONCHIAL NEEDLE ASPIRATION BIOPSY  05/25/2023   Procedure: BRONCHIAL NEEDLE ASPIRATION BIOPSIES;  Surgeon: Brenna Adine CROME, DO;  Location: [facility];  Service: Pulmonary;;   FIDUCIAL MARKER PLACEMENT  05/25/2023   Procedure: FIDUCIAL MARKER PLACEMENT;  Surgeon: Brenna Adine CROME, DO;  Location: [facility];  Service: Pulmonary;;   KNEE SURGERY Left      Social History:   reports that she quit smoking about 7 years ago. Her smoking use included cigarettes. She started smoking about 7 years ago. She has a 55 pack-year smoking history. She has never used smokeless tobacco. She reports that she does not drink alcohol and does not use drugs.  Family History:  Her family history includes Hyperlipidemia in her father and mother; Hypertension in her father and mother.   Allergies Allergies  Allergen Reactions   Ace Inhibitors Swelling   Diphenhydramine Other (See Comments)    Hyperactive/jittery    Doxycycline Hyclate Nausea And Vomiting   Codeine Nausea And Vomiting     Home Medications  Prior to Admission medications   Medication Sig Start Date End Date Taking? Authorizing Provider  acetaminophen  (TYLENOL ) 500 MG tablet Take 1,000 mg by mouth every 6 (six) hours as needed for moderate pain.   Yes [provider]  albuterol  (PROVENTIL  HFA;VENTOLIN  HFA) 108 (90 BASE) MCG/ACT inhaler Inhale 2 puffs into the lungs every 6 (six) hours as needed for  wheezing or shortness of breath. 09/19/13  Yes Johnson, Clanford L, MD  aspirin  81 MG chewable tablet Chew 1 tablet (81 mg total) by mouth daily. 09/19/13  Yes Johnson, Clanford L, MD  atorvastatin  (LIPITOR) 10 MG tablet TAKE 1 TABLET BY MOUTH EVERY DAY 07/21/23  Yes Krasowski, Robert J, MD  benzonatate  (TESSALON ) 100 MG capsule Take 100 mg by mouth 3 (three) times daily as needed for cough.    Yes [provider]  Biotin 10 MG CAPS Take 10 mg by mouth daily.   Yes [provider]  budesonide -formoterol  (SYMBICORT ) 160-4.5 MCG/ACT inhaler Inhale 2 puffs into the lungs 2 (two) times daily. 08/23/19  Yes Mannam, Praveen, MD  calcium  carbonate (OSCAL) 1500 (600 Ca) MG TABS tablet Take 600 mg of elemental calcium  by mouth daily.   Yes [provider]  Cholecalciferol (VITAMIN D ) 50 MCG (2000 UT) tablet Take 2,000 Units by mouth daily.   Yes [provider]  diphenhydramine-acetaminophen  (TYLENOL  PM) 25-500 MG TABS tablet Take 1 tablet by mouth at bedtime.   Yes [provider]  furosemide  (LASIX ) 40 MG tablet Take 40 mg by mouth daily.   Yes [provider]  gabapentin  (NEURONTIN ) 300 MG capsule Take 900 mg by mouth at bedtime.   Yes [provider]  iron polysaccharides (NIFEREX) 150 MG capsule Take 150 mg by mouth daily. 11/05/20  Yes [provider]  loratadine (CLARITIN) 10 MG tablet Take 10 mg by mouth daily as needed for allergies.   Yes [provider]  LORazepam  (ATIVAN ) 1 MG tablet Take 1 tablet (1 mg total) by mouth every 6 (six) hours as needed for anxiety. 08/07/17  Yes Drusilla Sabas RAMAN, MD  losartan  (COZAAR ) 50 MG tablet Take 50 mg by mouth daily.   Yes [provider]  Multiple Vitamin (MULTIVITAMIN WITH MINERALS) TABS tablet Take 1 tablet by mouth daily.   Yes [provider]  raloxifene  (EVISTA ) 60 MG tablet Take 60 mg by mouth every morning. 08/07/20  Yes [provider]  Tiotropium  Bromide Monohydrate (SPIRIVA  RESPIMAT) 2.5 MCG/ACT AERS Inhale 1 puff into the lungs daily.   Yes [provider]  traZODone  (DESYREL ) 100 MG tablet Take 1 tablet (100 mg total) by mouth at bedtime as needed for sleep. 08/07/17  Yes Drusilla Sabas RAMAN, MD     Critical care time:

## 2024-08-05 NOTE — Progress Notes (Signed)
 Katie Waller                                          MRN: [id number]   August 27, 2024   The [facility] Quality Team Specialist reviewed this patient medical record for the purposes of chart review for care gap closure. The following were reviewed: chart review for care gap closure-kidney health evaluation for diabetes:uACR.    [facility]BCI Quality Team

## 2024-08-05 NOTE — Progress Notes (Signed)
 PHARMACY - ANTICOAGULATION CONSULT NOTE  Pharmacy Consult:  Bivalirudin  >> Heparin   Indication: atrial fibrillation and internal jugular thombus   Allergies  Allergen Reactions   Ace Inhibitors Swelling   Diphenhydramine Other (See Comments)    Hyperactive/jittery    Doxycycline Hyclate Nausea And Vomiting   Codeine Nausea And Vomiting    Patient Measurements: Height: 5' 1 (154.9 cm) Weight: 70 kg (154 lb 5.2 oz) IBW/kg (Calculated) : 47.8 HEPARIN  DW (KG): 63.1  Vital Signs: Temp: 101.4 F (38.6 C) 2024/08/02 1102) Temp Source: Axillary 02-Aug-2024 1102) BP: 108/43 08-02-24 1130) Pulse Rate: 105 2024-08-02 1130)  Labs: Recent Labs    07/29/24 0434 07/29/24 1654 07/30/24 0446 07/30/24 2231 02-Aug-2024 0200 Aug 02, 2024 0700 08-02-2024 1018  HGB 7.0*  --  6.3*  --  8.8*  --   --   HCT 20.8*  --  18.3*  --  26.0*  --   --   PLT 403*  --  445*  --  483*  --   --   APTT 71* 77* 69*  --   --   --   --   HEPARINUNFRC  --   --   --  <0.10*  --   --  0.11*  CREATININE 1.43*  --  2.37*  --   --  1.86*  --     Estimated Creatinine Clearance: 23.4 mL/min (A) (by C-G formula based on SCr of 1.86 mg/dL (H)).  Assessment: 14 YOF with recurrent Afib to resume bivalirudin .  She was previously on IV heparin  and was switched to bivalirudin  due to labile heparin  levels.  Med was stopped because it was thought that Afib was secondary to critical illness, which resolved. However patient went back into Afib w/ RVR on 11/7 and bivalirudin  has been resumed.  11/25: In discussion with CCM, will trial switching back to heparin  infusion to facilitate discharge. Previously, patient was supratherapeutic at typical starting doses of heparin . She had therapeutic levels ~ 400 unit/hr of heparin . Resumed at this rate and titrating as needed.   Heparin  level subtherapeutic at 0.11. CBC stable. No bleeding issues noted.   Goal of Therapy:  Heparin  level 0.3-0.7 units/ml Monitor platelets by anticoagulation protocol:  Yes   Plan:  Increase heparin  to 800 units/hr  Heparin  level in 8 hours and daily  Daily CBC  Thank you for allowing pharmacy to participate in this patient's care.  Gladie Gravette, PharmD

## 2024-08-05 NOTE — Code Documentation (Signed)
  Patient Name: Katie Waller   MRN: [id number]   Date of Birth/ Sex: 1946/04/22 , female      Admission Date: 06/25/2024  Attending Provider: Meade Verdon RAMAN, MD  Primary Diagnosis: Sepsis [facility])   Indication: Pt was in her usual state of health until this PM, when she was noted to be Wide QRS became pulsesless. Code blue was subsequently called. At the time of arrival on scene, ACLS protocol was underway.   Technical Description:  - CPR performance duration:  10  minutes  - Was defibrillation or cardioversion used? No   - Was external pacer placed? No  - Was patient intubated pre/post CPR? Intubated Pre-CPR    Medications Administered: Y = Yes; Blank = No Amiodarone     Atropine     Calcium   Y  Epinephrine   Y  Lidocaine     Magnesium     Norepinephrine     Phenylephrine     Sodium bicarbonate   Y  Vasopressin      Post CPR evaluation:  - Final Status - Was patient successfully resuscitated ? Yes - What is current rhythm? Sinus with wide QRS  - What is current hemodynamic status? Unstable   Miscellaneous Information:  - Labs sent, including: CBC, CMP, Lactic acid, ABG   - Primary team notified?  Yes  - Family Notified? Yes  - Additional notes/ transfer status: ICU      Heddy Barren, DO  08-05-24, 2:41 PM

## 2024-08-05 NOTE — Progress Notes (Signed)
 Daughter expressed to change code status to DNR

## 2024-08-05 NOTE — Procedures (Signed)
 Extubation Procedure Note  Patient Details:   Name: Katie Waller DOB: 17-Jan-1946 MRN: [id number]   Airway Documentation:    Vent end date: Aug 06, 2024 Vent end time: 1501   Evaluation  O2 sats: currently acceptable Complications: No apparent complications Patient did tolerate procedure well. Bilateral Breath Sounds: Diminished   No Pt extubated per CCM order.  Leontine Murtis GAILS 08-06-24, 3:01 PM

## 2024-08-05 NOTE — Procedures (Signed)
 Cardiopulmonary Resuscitation Note  Katie Waller  [id number]  1946-05-24  Date:08-27-24  Time:3:39 PM   Provider Performing:[name] GORMAN Gore   Procedure: Cardiopulmonary Resuscitation [phone])  Indication(s) Loss of Pulse  Consent N/A  Anesthesia N/A   Time Out N/A   Sterile Technique Hand hygiene, gloves   Procedure Description Called to patient's room for CODE BLUE. Initial rhythm was PEA/Asystole. Patient received high quality chest compressions for 10 minutes with defibrillation or cardioversion when appropriate. Epinephrine  was administered every 3 minutes as directed by time biomedical engineer. Additional pharmacologic interventions included calcium  chloride, sodium bicarbonate , and D50. Return of spontaneous circulation was achieved. However shortly after ROSC patient was made DNR and was pronounced dead after return to PEA.   Family at bedside.   Complications/Tolerance N/A   EBL N/A   Specimen(s) N/A  Estimated time to ROSC: 10 minutes

## 2024-08-05 NOTE — Progress Notes (Signed)
 Patient ID: Katie Waller, female   DOB: 23-Mar-1946, 78 y.o.   MRN: [id number] [facility] Progress Note   Assessment/ Plan:   1. Acute kidney Injury: Baseline creatinine noted to be normal and acute injury is suspected to be ATN in the setting of sepsis.  Started on renal replacement therapy for volume overload on 11/11 (previously CRRT until 11/16) and attempted transitioning to hemodialysis while on midodrine  (last treatment done yesterday).  She is back on pressors and does not have any acute dialysis needs at this time.  Will monitor her over the next 24-48 hours to decide viability for IHD versus CRRT.   2.  Acute on chronic hypoxic respiratory failure: Secondary to metapneumovirus pneumonia and earlier treated with antibiotics.  Status post tracheostomy on 11/9 with ongoing management per CCM/pulmonary service. 3.  Anemia: Secondary to acute/critical illness and without overt blood loss.  Hemoglobin improved status post PRBC transfusion.  No overt blood loss 4.  Recurrent atrial fibrillation/ IJ thrombus: On anticoagulation with bivalirudin . 5.  Bilateral lower extremity ischemia: Vascular surgery note reviewed, she is critically ill/unstable for aortobifemoral bypass and palliative above-knee amputations would be offered if tissue loss progresses. 6.  Fever/tachycardia/hypotension: Without leukocytosis.  Will likely need pancultures/evaluation for infection focus.  Currently not on antibiotics.  Subjective:   Had PRBC transfusion yesterday for significant anemia.  Overnight with hypotension requiring initiation of pressors.  KUB showing no evidence of obstruction but showed persistent gaseous distention of small and large bowel throughout.   Objective:   BP (!) 100/53   Pulse (!) 115   Temp (!) 103.4 F (39.7 C) (Axillary)   Resp (!) 25   Ht 5' 1 (1.549 m)   Wt 69.6 kg   SpO2 93%   BMI 28.99 kg/m   Intake/Output Summary (Last 24 hours) at 2024/08/15 0758 Last  data filed at Aug 15, 2024 0700 Gross per 24 hour  Intake 3603.28 ml  Output 2300 ml  Net 1303.28 ml   Weight change: 0 kg  Physical Exam: Gen: Critically ill-appearing patient, NG tube in situ.  Awakens to touch CVS: Pulse regular rhythm, normal rate, S1 and S2 normal Resp: Anteriorly clear to auscultation without distinct rales or rhonchi.  Status post tracheostomy Abd: Soft, flat, nontender, bowel sounds normal Ext: Bilateral lower extremities in supportive booties.  1-2+ upper extremity pitting edema  Imaging: DG Abd 1 View Result Date: 07/30/2024 EXAM: 1 VIEW XRAY OF THE ABDOMEN 07/30/2024 08:36:00 PM COMPARISON: 07/29/2024 CLINICAL HISTORY: Abdominal distention FINDINGS: LINES, TUBES AND DEVICES: Enteric tube in place with tip and side port projecting over the distal stomach. Weighted enteric tube in place with tip and side port projecting over the gastric body. Right chest tube noted. Central venous catheter in place with tip projecting over the SVC. BOWEL: Persistent diffuse gaseous distension of small and large bowel. SOFT TISSUES: Vascular calcifications. No opaque urinary calculi. BONES: No acute osseous abnormality. LUNG BASES/PLEURAL SPACES: Similar interstitial opacities at the lung bases. Small left pleural effusions. IMPRESSION: 1. Persistent diffuse gaseous distension of small and large bowel without radiographic evidence of mechanical obstruction. Electronically signed by: Oneil Devonshire MD 07/30/2024 08:52 PM EST RP Workstation: [id number]   DG Abd 1 View Result Date: 07/29/2024 EXAM: 1 VIEW XRAY OF THE ABDOMEN 07/29/2024 11:48:00 AM COMPARISON: 07/20/2024 07/18/2001 CLINICAL HISTORY: Ileus (HCC) FINDINGS: LINES, TUBES AND DEVICES: Enteric tube in place with tip and side port overlying the expected region of the gastric lumen. Feeding tube in place with tip  overlying the expected region of the gastric lumen. BOWEL: Persistent gaseous dilatation of small bowel with some colon gas  present. SOFT TISSUES: Atherosclerotic plaque. No opaque urinary calculi. BONES: No acute osseous abnormality. IMPRESSION: 1. Persistent gaseous dilatation of multiple small and some large bowel suggesting ileus or partial obstruction. Electronically signed by: Luke Bun MD 07/29/2024 09:06 PM EST RP Workstation: [id number]    Labs: BMET Recent Labs  Lab 07/26/24 0409 07/26/24 9277 07/27/24 0424 07/28/24 0500 07/29/24 0434 07/30/24 0446 2024/08/10 0700  NA 132* 133* 132* 137 137 140 132*  K 3.1* 3.3* 3.4* 4.1 3.5 4.1 4.6  CL 86* 94* 85* 98 94* 100 99  CO2 21* 23 18* 22 26 20* 19*  GLUCOSE 266* 158* 278* 189* 159* 214* 214*  BUN 56* 66* 93* 81* 63* 118* 76*  CREATININE 2.03* 1.98* 2.83* 2.04* 1.43* 2.37* 1.86*  CALCIUM  7.2* 8.1* 7.6* 8.5* 8.9 8.9 7.9*  PHOS 2.0* 1.9* 4.2 4.3 3.0 5.8* 4.5   CBC Recent Labs  Lab 07/28/24 0500 07/29/24 0434 07/30/24 0446 08-10-2024 0200  WBC 21.5* 21.6* 18.8* 6.6  HGB 7.3* 7.0* 6.3* 8.8*  HCT 21.4* 20.8* 18.3* 26.0*  MCV 87.3 87.8 87.1 87.5  PLT 352 403* 445* 483*    Medications:     sodium chloride    Intravenous Once   sodium chloride    Intravenous Once   amiodarone   200 mg Per Tube Daily   arformoterol   15 mcg Nebulization BID   bisacodyl   10 mg Rectal Daily   Chlorhexidine  Gluconate Cloth  6 each Topical Daily   collagenase    Topical Daily   [START ON 08/03/2024] darbepoetin (ARANESP ) injection - DIALYSIS  60 mcg Subcutaneous Q Sat-1800   feeding supplement ([employer] STANDARD ENT 1.4)  1,000 mL Per Tube Q24H   fentaNYL  (SUBLIMAZE ) injection  25-50 mcg Intravenous Once   insulin  aspart  0-15 Units Subcutaneous Q4H   lidocaine -EPINEPHrine   20 mL Intradermal Once   midodrine   10 mg Per Tube TID   neostigmine   0.5 mg Subcutaneous q12n4p   mouth rinse  15 mL Mouth Rinse Q2H   oxyCODONE   5 mg Per Tube Q6H   pantoprazole  (PROTONIX ) IV  40 mg Intravenous Q24H   polyethylene glycol  17 g Per Tube BID   revefenacin   175 mcg  Nebulization Daily   senna  2 tablet Per Tube Daily   sodium chloride  flush  10 mL Intrapleural Q8H   sodium chloride  flush  10-40 mL Intracatheter Q12H   vasopressin         Gordy Blanch, MD 2024/08/10, 7:58 AM

## 2024-08-05 DEATH — deceased

## 2024-09-16 MED FILL — Medication: Qty: 1 | Status: AC

## 2024-11-14 ENCOUNTER — Ambulatory Visit: Payer: Self-pay | Admitting: Radiation Oncology
